# Patient Record
Sex: Male | Born: 1935
Health system: Southern US, Community
[De-identification: ages and names within clinical notes are randomized; demographics above are authoritative.]

## PROBLEM LIST (undated history)

## (undated) DIAGNOSIS — J45909 Unspecified asthma, uncomplicated: Secondary | ICD-10-CM

## (undated) DIAGNOSIS — I10 Essential (primary) hypertension: Secondary | ICD-10-CM

## (undated) DIAGNOSIS — G454 Transient global amnesia: Secondary | ICD-10-CM

## (undated) DIAGNOSIS — R0600 Dyspnea, unspecified: Secondary | ICD-10-CM

## (undated) DIAGNOSIS — I219 Acute myocardial infarction, unspecified: Secondary | ICD-10-CM

## (undated) DIAGNOSIS — I639 Cerebral infarction, unspecified: Secondary | ICD-10-CM

## (undated) DIAGNOSIS — R011 Cardiac murmur, unspecified: Secondary | ICD-10-CM

## (undated) DIAGNOSIS — I493 Ventricular premature depolarization: Secondary | ICD-10-CM

## (undated) DIAGNOSIS — E785 Hyperlipidemia, unspecified: Secondary | ICD-10-CM

## (undated) DIAGNOSIS — C61 Malignant neoplasm of prostate: Secondary | ICD-10-CM

## (undated) DIAGNOSIS — K219 Gastro-esophageal reflux disease without esophagitis: Secondary | ICD-10-CM

## (undated) DIAGNOSIS — Z87442 Personal history of urinary calculi: Secondary | ICD-10-CM

## (undated) DIAGNOSIS — I2510 Atherosclerotic heart disease of native coronary artery without angina pectoris: Secondary | ICD-10-CM

## (undated) DIAGNOSIS — I739 Peripheral vascular disease, unspecified: Secondary | ICD-10-CM

## (undated) HISTORY — DX: Ventricular premature depolarization: I49.3

## (undated) HISTORY — PX: COLONOSCOPY: SHX174

## (undated) HISTORY — DX: Unspecified asthma, uncomplicated: J45.909

## (undated) HISTORY — PX: MASTOIDECTOMY: SHX711

## (undated) HISTORY — PX: EYE SURGERY: SHX253

## (undated) HISTORY — PX: CARDIAC CATHETERIZATION: SHX172

## (undated) HISTORY — PX: MITRAL VALVE REPAIR: SHX2039

## (undated) HISTORY — PX: PROSTATECTOMY: SHX69

---

## 1997-11-28 ENCOUNTER — Emergency Department (HOSPITAL_COMMUNITY): Admission: EM | Admit: 1997-11-28 | Discharge: 1997-11-28 | Payer: Self-pay | Admitting: Emergency Medicine

## 1997-12-13 ENCOUNTER — Emergency Department (HOSPITAL_COMMUNITY): Admission: EM | Admit: 1997-12-13 | Discharge: 1997-12-13 | Payer: Self-pay | Admitting: Emergency Medicine

## 1998-07-14 ENCOUNTER — Ambulatory Visit (HOSPITAL_COMMUNITY): Admission: RE | Admit: 1998-07-14 | Discharge: 1998-07-14 | Payer: Self-pay | Admitting: Gastroenterology

## 1998-09-30 ENCOUNTER — Other Ambulatory Visit: Admission: RE | Admit: 1998-09-30 | Discharge: 1998-09-30 | Payer: Self-pay | Admitting: Urology

## 2000-02-28 ENCOUNTER — Other Ambulatory Visit: Admission: RE | Admit: 2000-02-28 | Discharge: 2000-02-28 | Payer: Self-pay | Admitting: Urology

## 2000-02-28 ENCOUNTER — Encounter (INDEPENDENT_AMBULATORY_CARE_PROVIDER_SITE_OTHER): Payer: Self-pay | Admitting: Specialist

## 2000-04-12 ENCOUNTER — Encounter: Payer: Self-pay | Admitting: Urology

## 2000-04-16 ENCOUNTER — Inpatient Hospital Stay (HOSPITAL_COMMUNITY): Admission: RE | Admit: 2000-04-16 | Discharge: 2000-04-19 | Payer: Self-pay | Admitting: Urology

## 2000-04-16 ENCOUNTER — Encounter (INDEPENDENT_AMBULATORY_CARE_PROVIDER_SITE_OTHER): Payer: Self-pay | Admitting: *Deleted

## 2000-12-27 ENCOUNTER — Encounter: Payer: Self-pay | Admitting: Otolaryngology

## 2000-12-27 ENCOUNTER — Ambulatory Visit (HOSPITAL_COMMUNITY): Admission: RE | Admit: 2000-12-27 | Discharge: 2000-12-27 | Payer: Self-pay | Admitting: Otolaryngology

## 2002-10-14 ENCOUNTER — Encounter: Admission: RE | Admit: 2002-10-14 | Discharge: 2002-10-14 | Payer: Self-pay | Admitting: Otolaryngology

## 2002-10-14 ENCOUNTER — Encounter: Payer: Self-pay | Admitting: Otolaryngology

## 2006-05-25 ENCOUNTER — Inpatient Hospital Stay (HOSPITAL_COMMUNITY): Admission: EM | Admit: 2006-05-25 | Discharge: 2006-05-29 | Payer: Self-pay | Admitting: Emergency Medicine

## 2007-08-26 ENCOUNTER — Observation Stay (HOSPITAL_COMMUNITY): Admission: EM | Admit: 2007-08-26 | Discharge: 2007-08-28 | Payer: Self-pay | Admitting: Emergency Medicine

## 2007-08-27 ENCOUNTER — Encounter (INDEPENDENT_AMBULATORY_CARE_PROVIDER_SITE_OTHER): Payer: Self-pay | Admitting: Gastroenterology

## 2007-08-27 ENCOUNTER — Encounter (INDEPENDENT_AMBULATORY_CARE_PROVIDER_SITE_OTHER): Payer: Self-pay | Admitting: Emergency Medicine

## 2009-05-26 ENCOUNTER — Emergency Department (HOSPITAL_COMMUNITY): Admission: EM | Admit: 2009-05-26 | Discharge: 2009-05-26 | Payer: Self-pay | Admitting: Emergency Medicine

## 2009-07-23 ENCOUNTER — Observation Stay (HOSPITAL_COMMUNITY): Admission: RE | Admit: 2009-07-23 | Discharge: 2009-07-24 | Payer: Self-pay | Admitting: Cardiology

## 2010-08-24 LAB — BASIC METABOLIC PANEL
CO2: 26 mEq/L (ref 19–32)
Calcium: 8.4 mg/dL (ref 8.4–10.5)
Chloride: 105 mEq/L (ref 96–112)
GFR calc Af Amer: >60 mL/min (ref >60)
Sodium: 135 mEq/L (ref 135–145)

## 2010-08-24 LAB — CBC
Hemoglobin: 12.8 g/dL — ABNORMAL LOW (ref 13.0–17.0)
MCHC: 34.4 g/dL (ref 30.0–36.0)
RBC: 3.95 MIL/uL — ABNORMAL LOW (ref 4.22–5.81)
WBC: 8.1 10*3/uL (ref 4.0–10.5)

## 2010-10-18 NOTE — Discharge Summary (Signed)
NAME:  Adam Arellano, Adam Arellano NO.:  [id number]   MEDICAL RECORD NO.:  [id number]          PATIENT TYPE:  OBV   LOCATION:  3729                         FACILITY:  [facility]   PHYSICIAN:  Danise Edge, M.D.   DATE OF BIRTH:  01-26-1932   DATE OF ADMISSION:  08/26/2007  DATE OF DISCHARGE:  08/28/2007                               DISCHARGE SUMMARY   DISCHARGE DIAGNOSIS:  Transient global amnesia.   HOSPITAL COURSE:  Mr. Adam Arellano is a 77 year old male born on  03/20/1932.  Mr. Adam Arellano developed an acute coronary syndrome  associated with slight elevation in his troponin levels in December  2007.  He underwent cardiac catheterization with placement of a drug-  eluting stent in the right coronary artery.   Mr. Adam Arellano was admitted to the hospital on August 26, 2007, with acute  confusion.  His wife reported that he was normal the day prior to  admission.  After taking a shower in preparation to going out to eat  with friends, he exited the shower quite confused consistent with  transient global amnesia.  The last thing Mr. Adam Arellano actually  remembers was having his tires put on his car at noon, the day of  admission.   On admission to the hospital, Mr. Adam Arellano electrocardiogram and chest  x-ray were reported to be normal.  His complete metabolic profile was  normal except for a low serum potassium 2.8 secondary to  hydrochlorothiazide.  His troponin level and CK-MB were normal.  His CBC  was normal.  His prothrombin time and partial thromboplastin time were  normal.  With oral potassium and stopping his hydrochlorothiazide, his  potassium rose to 3.3.  His fasting lipid profile showed a cholesterol  118, triglyceride 53, LDL cholesterol 73, and HDL cholesterol 34 on  simvastatin 40 mg daily.  Carotid Dopplers revealed no significant  internal carotid artery obstruction.  His noncontrasted CT scan of the  brain was reported to be normal by the admitting  physician.  There is no  report of the chest x-ray or the brain CT in the computer despite the  patient being in the hospital over 2 days.  Still pending results  include his 2-D cardiac echo and MRI - MRA of the brain despite having  been done over 24 hours ago.   Adam Arellano global amnesia has completely cleared.  He is medically  stable for discharge.   Despite having a number of test results unavailable, I do not want to  hold Mr. Adam Arellano in the hospital another whole day just waiting for  test results to come back.  He is being discharged in stable medical  condition and I will follow him up as an outpatient.   PAST MEDICAL HISTORY:  1. Prostate cancer.  2. Radical retropubic prostatectomy.  3. Coronary artery disease.  4. Hypertension.  5. Asthma.  6. Gastroesophageal reflux disease, December 2007.  7. Cardiac cath with placement of a right coronary artery drug-eluting      stent.  8. Dyslipidemia.   DISCHARGE MEDICATIONS:  1. Aspirin 81 mg daily.  2. Plavix  75 mg daily.  3. Omeprazole 20 mg each morning.  4. Simvastatin 40 mg daily.  5. Metoprolol 25 mg twice daily.   MEDICATION ALLERGIES:  None.   OFFICE FOLLOWUP:  I will meet with Mr. Adam Arellano in the office to go over  his outstanding lab tests when I can get the report back to him  hopefully in the next few days.           ______________________________  Danise Edge, M.D.     MJ/MEDQ  D:  08/28/2007  T:  08/29/2007  Job:  [id number]

## 2010-10-18 NOTE — H&P (Signed)
NAME:  Adam Arellano, Adam Arellano NO.:  [id number]   MEDICAL RECORD NO.:  [id number]          PATIENT TYPE:  EMS   LOCATION:  MAJO                         FACILITY:  [facility]   PHYSICIAN:  Michiel Cowboy, MDDATE OF BIRTH:  [birth year]   DATE OF ADMISSION:  08/26/2007  DATE OF DISCHARGE:                              HISTORY & PHYSICAL   PRIMARY CARE PHYSICIAN:  Danise Edge, M.D.   HISTORY OF PRESENT ILLNESS:  This is a 77 year old gentleman with a  history of hypertension, coronary artery disease, who was well until  this morning when he was noted by his family when he got out of the  shower he was confused.  The patient's family is currently not at  bedside.  The patient actually states that he does not remember any of  these events.  The last time he remembers he was supposed to pick up  some family from a different city and the next thing he knows he is in  the emergency department.  Per family, they also were not clear at which  point did the patient become confused, at least as per what has been  documented in the emergency department.  Again, currently the patient's  family is not around.   PAST MEDICAL HISTORY:  1. History of coronary artery disease status post stent placement in      2007.  2. History of hyperlipidemia and hypertension.  3. Asthma and GERD.   SOCIAL HISTORY  no tobacco, alcohol or drugs   FAMILY HISTORY  unremarkable   ALLERGIES:  No known drug allergies.   MEDICATIONS:  1. Albuterol as needed.  2. Simvastatin.  3. Hydrochlorothiazide.  4. Plavix.  5. Ranitidine.   Of note, in the past the patient was on Lopressor and Protonix but  currently not taking these medications.   PHYSICAL EXAMINATION:  VITAL SIGNS:  Blood pressure 145/77, heart rate  71, respirations 23, saturating 96% on room air.  GENERAL:  The patient is in no acute distress sitting on a stretcher.  Currently alert and oriented x3.  HEENT:  Head atraumatic.  Moist  mucous membranes.  LUNGS:  Clear to auscultation bilaterally.  EXTREMITIES:  Lower extremities without edema.  HEART:  Regular rate and rhythm without murmurs, rubs, or gallops.  ABDOMEN:  Soft and nontender.  NEUROLOGY:  Intact.   LABORATORY DATA:  White blood cell count 11.7, hemoglobin 14, platelets  307.  Sodium 139, potassium 3.3, creatinine 1.4.   STUDIES:  Chest x-ray shows no acute abnormality.   CT of the head without contrast showing no acute abnormalities.   EKG showing heart rate of 71, no change from EKG on May 28, 2006.   ASSESSMENT:  1. This is a 77 year old gentleman with history of confusion, acute in      onset, differential includes transient ischemic attack versus      stroke.  Metabolic otherwise the patient is within normal limits.      His potassium is slightly low, but we will replace this.  We will      admit the patient to telemetry, obtain a  stroke workup including      carotids, echocardiogram, and MRI/MRA of the brain.  Have physical      therapy evaluate the patient.  Continue Plavix.  May need to      discuss further with neurology in the future.  I will obtain      fasting lipid panel and see if the patient needs to have further      adjustment of his Zocor.  Of note, given that it is unclear when      the patient became confused, the patient was not a candidate for      thrombolytics.  2. Hypertension.  Given possibility of stroke, we will hold off on      antihypertensives right now.  May need to be restarted tomorrow if      the patient's blood pressure becomes significantly elevated.  3. Prophylaxis; SEDs and Protonix 40 mg p.o. daily.      Michiel Cowboy, MD  Electronically Signed     AVD/MEDQ  D:  08/26/2007  T:  08/26/2007  Job:  [id number]   cc:   Danise Edge, M.D.

## 2010-10-21 NOTE — H&P (Signed)
[place]. [facility]  Patient:    Adam Arellano, Adam Arellano                      MRN: [id number] Adm. Date:  [id number] Attending:  Lauree Chandler CC:         Verlin Grills, M.D.   History and Physical  HISTORY OF PRESENT ILLNESS:  This 77 year old gentleman was found to have prostatic carcinoma on needle biopsy of the prostate.  His PSAs had risen in the range of 5.6-6.63.  A needle biopsy on February 27, 2000, revealed a Gleason grade 6 adenocarcinoma in the left lobe and some basaloid hyperplasia in the right lobe along with atrophic changes.  He and his family were counseled about therapeutic options and he elected for radical retropubic prostatectomy.  They were advised about the medical complications and risks of incompetence, impotence, rectal injury, or hemorrhage.  He is in general good health.  He has some dyspepsia at times, some mild hypertension, and a history of asthma.  FAMILY HISTORY:  His family history is unremarkable and his mother lived to be 61.  His fathers history is unknown.  MEDICATIONS: 1. Cardura for voiding dysfunction, which we will discontinue. 2. Hydrochlorothiazide 12.5 mg daily. 3. Inhalers, including albuterol on a p.r.n. basis.  ALLERGIES:  None.  SOCIAL HISTORY:  Tobacco:  None.  Alcohol:  None.  REVIEW OF SYSTEMS:  As noted on our family history health form.  PHYSICAL EXAMINATION:  A pleasant white male with a ruddy complexion.  He is alert and oriented.  The skin is warm and dry.  In no acute distress.  NECK:  Supple.  CHEST:  Clear.  HEART:  Heart tones regular.  ABDOMEN:  Soft and nontender.  GENITALIA:  External genitalia unremarkable.  RECTAL:  The prostate feels benign and enlarged.  EXTREMITIES:  No edema.  IMPRESSION: 1. Prostatic carcinoma. 2. Hypertension. 3. Asthma. 4. Gastroesophageal reflux disease. DD:  04/16/00 TD:  04/16/00 Job: [id number]

## 2010-10-21 NOTE — Cardiovascular Report (Signed)
NAME:  Adam Arellano, Adam Arellano NO.:  [id number]   MEDICAL RECORD NO.:  [id number]          PATIENT TYPE:  INP   LOCATION:  2807                         FACILITY:  [facility]   PHYSICIAN:  Madaline Savage, M.D.DATE OF BIRTH:  Mar 25, 1932   DATE OF PROCEDURE:  05/28/2006  DATE OF DISCHARGE:                            CARDIAC CATHETERIZATION   PROCEDURES PERFORMED:  1. Selective coronary angiography by Judkins technique.  2. Retrograde left heart catheterization.  3. Left ventricular angiography.  4. Intracoronary artery stenting of the distal right coronary artery      preceded by balloon angioplasty of the same lesion.  5. AngioSeal closure of the right femoral artery.   COMPLICATIONS:  None.   ENTRY SITE:  Right femoral.   DYE USED:  Omnipaque.   MEDICATIONS GIVEN:  Intracoronary artery nitroglycerin, intravenous  Integrilin, oral Plavix, and intravenous fentanyl. ACT during procedure  was 294.   PATIENT PROFILE:  The patient is a delightful 77 year old gentleman who  has enjoyed good health for most of his life who came into the emergency  room as a new the patient, having had chest and shoulder pain that had  been occurring for about 6 weeks, worse in the last few days, to both  shoulders and chest burning, occurring after meals most often. He  presented to the emergency room after chest pain became severe and would  not resolve.   He has a history of hypertension, prostate cancer status post  prostatectomy, and gastroesophageal reflux disease.  The patient's  cardiac enzymes showed his first troponin to be negative, but his second  troponin was positive as was his third and fourth troponin. Because of  this, he was brought to the catheterization lab for diagnostic cardiac  catheterization.  It should also be noted that the patient had a rest  stress test, and it showed decreased perfusion to the inferior wall   RESULTS:   PRESSURES:  Left ventricular pressure  was 135/0, end-diastolic pressure  13.  Central aortic pressure 135/70, mean of 95. No aortic valve  gradient by pullback technique.   ANGIOGRAPHIC RESULTS:  The patient's left main coronary artery was short  and normal.  It trifurcated into a large intermediate ramus branch, a  fairly large circumflex branch, and a smaller LAD.  The LAD contained  some luminal irregularities at the ostium but no significant lesions to  speak of throughout its course, and it gave rise to two septal  perforator branches and a diagonal branch.  No significant lesions in  LAD other than about a 30% ostial area of narrowing.  Intermediate ramus  branch was large and went all the way virtually to the cardiac apex.  No  lesions were seen throughout this vessel.   Left circumflex contained luminal irregularities throughout. It gave  rise to a first obtuse marginal branch which was small and a second very  large diagonal branch which gave rise to a second obtuse marginal branch  which was large and basically not diseased. There was noted to be  collateralization from the septal perforator branches to the distal RCA.  Right coronary artery was a large and dominant vessel in its  circulation.  It showed a 50% area of smooth irregularity in the  proximal portion just after the tip of the guide catheter.  It appeared  to be a stable lesion. There was a second stable lesion in the mid  portion of the RCA is it made a turn toward the apex, and then there was  the patient's culprit vessel which was a 99% eccentric stenosis of the  distal RCA just before the vessel trifurcated into a posterolateral  artery and two posterior descending branches.   It should be noted there was TIMI-3 flow in this diseased culprit RCA  vessel.   Left ventricular angiography showed good contractility of all wall  segments without any significant inferior wall hypokinesis.  There was a  lot of ventricular irritability, however, during  the injection.   Percutaneous intervention was accomplished with 6-French catheters.  A  [employer] marker wire and a predilatation balloon, which was a  2.0 x 12 mm Maverick balloon and post dilatation with of 3.25 x 20 mm  Quantum Maverick balloon. The TAXUS element stent used for this  procedure was a 3.0 x 28 mm stent.   After trying an extra-support guide catheter for the right coronary  artery, I abandoned that because it had a tendency to deep throat the  vessel and cause damping of pressure.  I instead used a Cordis JR-4, 6-  [place] guide catheter. The guidewire used was the marker wire which was  an excellent choice. The Maverick balloon crossed the lesion easily and,  after two balloon dilatations, showed improvement of the tight ostial  stenosis that was eccentric in the distal RCA.  I then was able to place  a TAXUS element stent across the stenotic vessel, keeping in mind that  there was an area of proximal tight stenosis of 50% narrowing, and I was  able to cover both areas with the TAXUS stent.  I deployed it to  approximately 14 atmospheres of pressure and then resorted to a Quantum  Maverick balloon to post dilate, and I used approximately 16-17 mm of  pressure for about 45-60 seconds.  No complications occurred.  The  patient tolerated the procedure very well.  Finally I performed an  elective closure of the right femoral artery with Angio-Seal after  making sure by right femoral artery angiogram that the indwelling sheath  was in an appropriate place. The AngioSeal resulted in excellent  hemostasis.   The patient received 300 mg of Plavix along with Integrilin, and the ACT  was 294. No complications occurred.  The patient had a satisfactory  result.   FINAL DIAGNOSES:  1. Recent unstable angina and non-ST segment elevation myocardial      infarction by troponin evaluation. 2. Essentially single-vessel coronary artery disease with culprit      vessel being  the distal right coronary artery, 99% eccentrically      stenosed.  3. Successful TAXUS element stenting of the distal right coronary      artery with 99% stenosis reduced to 0% residual.   PLAN:  The patient will potentially be eligible for discharge tomorrow  after he completes his Integrilin this evening and more Plavix this  evening. We will see him in followup in a week or two.           ______________________________  Madaline Savage, M.D.     WHG/MEDQ  D:  05/28/2006  T:  05/28/2006  Job:  [id number]   cc:   Cardiac Catheterization Lab

## 2010-10-21 NOTE — Discharge Summary (Signed)
[place]. [facility]  Patient:    Adam Arellano, Adam Arellano                      MRN: [id number] Adm. Date:  [id number] Disc. Date: [id number] Attending:  Lauree Chandler CC:         Verlin Grills, M.D.   Discharge Summary  FINAL DIAGNOSES: 1. Prostate carcinoma. 2. Hypertension. 2. Asthma. 4. Gastroesophageal reflux disease.  PROCEDURE:  Radical retropubic prostatectomy and bilateral pelvic lymph node dissection.  HISTORY OF PRESENT ILLNESS:  This 77 year old gentleman was found to have a Gleason grade 6 adenocarcinoma when his PSA rose to the range of 5.6 to 6.6. He was counseled about therapeutic options and he elected surgical care. He has a history of hypertension, gastroesophageal reflux disease and asthma that are well-controlled.  PHYSICAL EXAMINATION:  Physical examination was unremarkable.  HOSPITAL COURSE:  After admission her underwent a radical retropubic prostatectomy.  His postoperative course was benign except for fever spike the night of surgery, but after that he became afebrile.  He had very little wound drainage.  His urine remained clear and his catheter functioned well.  He rapidly resumed bowel function.  He was ready for discharge on April 19, 2000 after having removed his Harrison Mons drain.  He will go home with  Foley catheter and leg bag.  DISCHARGE MEDICATIONS:  He will use Vicodin for pain and continue on hydrochlorothiazide and albuterol inhaler.  FOLLOW-UP:  He will return to the office next week for skin staple removal.  DISPOSITION:  He was sent home in good condition.  DISCHARGE DIET/ACTIVITY:  He will resume a diet as tolerated and light activity.  Final pathology is pending. DD:  04/19/00 TD:  04/19/00 Job: [id number]

## 2010-10-21 NOTE — Op Note (Signed)
[place]. [facility]  Patient:    Adam Arellano, Adam Arellano                      MRN: [id number] Proc. Date: 04/16/00 Adm. Date:  [id number] Attending:  Lauree Chandler CC:         Verlin Grills, M.D.   Operative Report  PREOPERATIVE DIAGNOSIS:  Carcinoma of the prostate.  POSTOPERATIVE DIAGNOSIS:  Carcinoma of the prostate.  OPERATION PERFORMED:  Radical retrograde pyelogram and bilateral pelvic lymph node dissection.  SURGEON:  Maretta Bees. Vonita Moss, M.D.  ASSISTANT:  Excell Seltzer. Annabell Howells, M.D.  ANESTHESIA:  General.  INDICATIONS FOR PROCEDURE:  This 77 year old gentleman was found to have Gleason grade 6 carcinoma of the prostate.  His PSA has been in the 5 to 6 range. He was counseled about therapeutic measures and opted for radical retropubic prostatectomy.  DESCRIPTION OF PROCEDURE:  The patient was brought to the operating room and placed in lithotomy position and the lower abdomen and external genitalia were prepped and draped in the usual fashion.  A lower midline incision was made from the symphysis pubis up towards the umbilicus.  The muscle and fascia were separated in the midline and the retroperitoneal and pelvis spaces were dissected out on each side.  There were some adhesions on the right side and there was an opening made in the peritoneum which was subsequently repaired with 3-0 chromic catgut.  The obturator lymph node package on each side were dissected up.  There was very scanty tissue.  Hemostasis and lymphostasis was obtained with metal hemoclips.  The major vessels and obturator nerves were intact bilaterally at the end of those dissections.  Tissue was sent for permanent section.  The endopelvic fascia was taken down bilaterally. The puboprostatic ligaments were taken down.  The dorsal vein complex was isolated with a Hohenfeltner clamp and a #1 Vicryl tie placed around it.  The urethra was divided just beyond the apex of the  prostate and then then a clamp placed around the posterior urethra which was then brought up through the wound and divided.  The Foley catheter was divided and used for traction proximally. The Denonvilliers fascia was divided sharply at the apex of the prostate and then blunt dissection was used to mobilize the prostate off the rectum.  The neurovascular bundle was not very well defined and was pushed away manually. The posterior aspect of the seminal vesicles was palpated and the left seminal vesicle felt somewhat firm and worrisome.  The vascular pedicles of the prostate on each side were clipped with metal hemoclips.  The anterior bladder neck was opened and the ureteral orifices were well away from the bladder neck and excreting indigo carmine stained urine.  The posterior bladder neck was opened and a plane easily developed between the bladder base and the prostate. The seminal vesicles were then dissected down to their tips on each side and divided and clipped with metal hemoclips.  The specimen was removed intact. The bladder neck was then constructed by sewing bladder mucosa to the adjacent bladder neck muscle with interrupted 4-0 chromic catgut.  The posterior bladder neck was closed in tennis racket fashion using running 2-0 chromic catgut.  This created a fingerwidth sized bladder neck.  A 22 French 5 cc Foley catheter was then placed per urethra and with that in place, the anastomotic sutures were placed using the 2-0 Vicryl on UR-5 needle between the bladder neck and urethra  at 2, 5, 7, 10 and subsequently 12 oclock.  The Foley catheter was placed in the bladder and 15 cc placed in the balloon.  The tip had secured to it a #1 Prolene, which was brought out through the anterior bladder wall and then later through the abdominal wall and sewn to the skin level with a button.  The anastomosis came down nicely and this was tied in place.  The wound was irrigated with triple  antibiotic solution.  Through stab wound through the lesser incision, a large flat Blake drain was placed for prevesical wound drainage postoperatively and the drain sewed in place with heavy black silk.  The rectus fascia was closed with running #1 PDS.  The subcutaneous was irrigated and the skin closed with skin staples.  The wound was cleaned, dressed with dry sterile gauze dressings. Sponge, needle and instrument counts were correct.  The patient was transferred to the recovery room in good condition.  Blood loss was 500 to 600 cc.  He tolerated the procedure well. DD:  04/16/00 TD:  04/16/00 Job: [id number]

## 2010-10-21 NOTE — Discharge Summary (Signed)
NAME:  YUL, DIANA NO.:  [id number]   MEDICAL RECORD NO.:  [id number]          PATIENT TYPE:  INP   LOCATION:  2924                         FACILITY:  [facility]   PHYSICIAN:  Madaline Savage, M.D.DATE OF BIRTH:  1931/08/03   DATE OF ADMISSION:  05/25/2006  DATE OF DISCHARGE:                               DISCHARGE SUMMARY   DISCHARGE DIAGNOSES:  1. Acute coronary syndrome, status post catheterization and PCI with      insertion of TAXUS stent to the distal RCA.  2. Hypertension.  3. Dyslipidemia.   HISTORY OF PRESENT ILLNESS/HOSPITAL COURSE:  This is a 77 year old  Caucasian male presenting to the emergency department at [facility] by EMS with complaints of chest pain and shoulder pain.  He  reported that it has been going on for probably six weeks and now with  increased burning in the chest occurring after meals and exertional  dyspnea as well as exertional chest discomfort.  He was admitted to the  telemetry unit and we cycled his cardiac enzymes, which revealed mildly  elevated troponins, 0.06 and 0.1 with normal CK and CK-MB.   On December 24 the patient underwent a coronary angiography by Dr.  Elsie Lincoln, which revealed diffuse coronary disease with culprit lesion in  the distal RCA - 99% stenosis.  Dr. Elsie Lincoln performed angioplasty and  inserted drug-eluting TAXUS stent with great result.  In the followup  the same day, patient was seen in cardiac rehab and the recommendations  were to proceed with the phase II cardiac rehab as an outpatient.   The next morning the patient was assessed by Dr. Alanda Amass and failed to  be stable for discharge home.  He will be followed up with his primary  care physician and Dr. Elsie Lincoln in our office.   DISCHARGE MEDICATIONS:  1. Lopressor 25 mg b.i.d.  2. Plavix 75 mg daily.  3. Aspirin 325 mg daily.  4. Hydrochlorothiazide 25 mg daily.  5. Protonix 40 mg daily.   DISCHARGE DIET:  Low salt, low fat, low  cholesterol diet.   DISCHARGE ACTIVITIES:  No lifting greater than 5 pounds and no driving  for three days.   DISCHARGE FOLLOWUP:  Our office will contact the patient to schedule a  followup appointment with Dr. Elsie Lincoln within the next 2-3 weeks.      Raymon Mutton, P.A.    ______________________________  Madaline Savage, M.D.    MK/MEDQ  D:  05/29/2006  T:  05/30/2006  Job:  [id number]   cc:   [facility]

## 2011-02-27 LAB — POCT I-STAT, CHEM 8
Calcium, Ion: 1.13
Chloride: 101
Glucose, Bld: 107 — ABNORMAL HIGH
HCT: 42
Hemoglobin: 14.3
TCO2: 29

## 2011-02-27 LAB — URINALYSIS, ROUTINE W REFLEX MICROSCOPIC
Nitrite: NEGATIVE
Specific Gravity, Urine: 1.017
pH: 5.5

## 2011-02-27 LAB — COMPREHENSIVE METABOLIC PANEL
ALT: 13
AST: 18
AST: 21
Albumin: 3.3 — ABNORMAL LOW
CO2: 28
Calcium: 8.9
Chloride: 104
Creatinine, Ser: 1.08
GFR calc Af Amer: >60
GFR calc Af Amer: >60
GFR calc non Af Amer: >60
Sodium: 142
Total Bilirubin: 0.7
Total Protein: 6.2

## 2011-02-27 LAB — CBC
Hemoglobin: 14
MCHC: 34.2
Platelets: 259
Platelets: 307
RBC: 4.08 — ABNORMAL LOW
RBC: 4.42
RDW: 12.1
RDW: 12.4

## 2011-02-27 LAB — DIFFERENTIAL
Basophils Absolute: 0
Lymphocytes Relative: 11 — ABNORMAL LOW
Lymphs Abs: 1.3
Neutro Abs: 9.9 — ABNORMAL HIGH

## 2011-02-27 LAB — POCT CARDIAC MARKERS
CKMB, poc: 1.2
Myoglobin, poc: 117
Operator id: 277751

## 2011-02-27 LAB — LIPID PANEL
Cholesterol: 118
HDL: 34 — ABNORMAL LOW
LDL Cholesterol: 73
Total CHOL/HDL Ratio: 3.5

## 2011-02-27 LAB — PROTIME-INR: Prothrombin Time: 12.9

## 2011-08-15 ENCOUNTER — Other Ambulatory Visit: Payer: Self-pay | Admitting: Gastroenterology

## 2012-04-09 ENCOUNTER — Other Ambulatory Visit: Payer: Self-pay | Admitting: Gastroenterology

## 2013-03-22 ENCOUNTER — Other Ambulatory Visit: Payer: Self-pay | Admitting: Cardiology

## 2013-05-16 ENCOUNTER — Emergency Department (HOSPITAL_COMMUNITY): Payer: Medicare Other

## 2013-05-16 ENCOUNTER — Ambulatory Visit: Payer: Self-pay | Admitting: Cardiology

## 2013-05-16 ENCOUNTER — Encounter (HOSPITAL_COMMUNITY): Payer: Self-pay | Admitting: Emergency Medicine

## 2013-05-16 ENCOUNTER — Emergency Department (HOSPITAL_COMMUNITY)
Admission: EM | Admit: 2013-05-16 | Discharge: 2013-05-16 | Disposition: A | Discharge status: Home / Self Care | Payer: Medicare Other | Attending: Interventional Cardiology | Admitting: Interventional Cardiology

## 2013-05-16 DIAGNOSIS — I2510 Atherosclerotic heart disease of native coronary artery without angina pectoris: Secondary | ICD-10-CM | POA: Insufficient documentation

## 2013-05-16 DIAGNOSIS — Z7982 Long term (current) use of aspirin: Secondary | ICD-10-CM | POA: Insufficient documentation

## 2013-05-16 DIAGNOSIS — I10 Essential (primary) hypertension: Secondary | ICD-10-CM | POA: Insufficient documentation

## 2013-05-16 DIAGNOSIS — M25519 Pain in unspecified shoulder: Secondary | ICD-10-CM | POA: Insufficient documentation

## 2013-05-16 DIAGNOSIS — Z79899 Other long term (current) drug therapy: Secondary | ICD-10-CM | POA: Insufficient documentation

## 2013-05-16 DIAGNOSIS — Z7902 Long term (current) use of antithrombotics/antiplatelets: Secondary | ICD-10-CM | POA: Insufficient documentation

## 2013-05-16 DIAGNOSIS — R079 Chest pain, unspecified: Secondary | ICD-10-CM | POA: Insufficient documentation

## 2013-05-16 HISTORY — DX: Atherosclerotic heart disease of native coronary artery without angina pectoris: I25.10

## 2013-05-16 HISTORY — DX: Malignant neoplasm of prostate: C61

## 2013-05-16 HISTORY — DX: Transient global amnesia: G45.4

## 2013-05-16 HISTORY — DX: Hyperlipidemia, unspecified: E78.5

## 2013-05-16 HISTORY — DX: Essential (primary) hypertension: I10

## 2013-05-16 HISTORY — DX: Gastro-esophageal reflux disease without esophagitis: K21.9

## 2013-05-16 LAB — TROPONIN I
Troponin I: <0.3 ng/mL (ref <0.30)
Troponin I: <0.3 ng/mL (ref <0.30)

## 2013-05-16 LAB — CBC
MCH: 31.6 pg (ref 26.0–34.0)
Platelets: 218 10*3/uL (ref 150–400)
RBC: 4.18 MIL/uL — ABNORMAL LOW (ref 4.22–5.81)
RDW: 12.3 % (ref 11.5–15.5)
WBC: 7.3 10*3/uL (ref 4.0–10.5)

## 2013-05-16 LAB — BASIC METABOLIC PANEL
CO2: 27 mEq/L (ref 19–32)
Chloride: 103 mEq/L (ref 96–112)
GFR calc Af Amer: 59 mL/min — ABNORMAL LOW (ref >90)
Potassium: 3.5 mEq/L (ref 3.5–5.1)
Sodium: 140 mEq/L (ref 135–145)

## 2013-05-16 NOTE — Consult Note (Signed)
The patient has been seen, interviewed, and examined. All data has been reviewed. I agree with the assessment and plan as noted above. The second cardiac markers unremarkable, we will have the patient followup with Dr. stains in the next one to 3 weeks.

## 2013-05-16 NOTE — ED Notes (Signed)
Pt complains of bilateral shoulder pain- but no chest pain. Pt states pain feels like it did when he had to get stents in the past. Pt denies SOB, dizziness, N/V.

## 2013-05-16 NOTE — ED Notes (Signed)
Pt provided recliner chair and [place] sandwich.

## 2013-05-16 NOTE — ED Notes (Signed)
Spoke with Adam Arellano, notified pt troponin is negative. Reporting pt will be discharged.

## 2013-05-16 NOTE — Consult Note (Signed)
History and Physical  Patient ID: Adam Arellano MRN: [id number], DOB: July 24, 1931 Date of Encounter: 05/16/2013, 2:44 PM Primary Physician: Charolett Bumpers, MD Primary Cardiologist: Anne Fu  Chief Complaint: neck/shoulder pain Reason for Admission: neck/shoulder pain  HPI: Adam Arellano is a 79 y/o active M with history of HTN, CAD s/p prior stenting as below, remote prostate CA, dyslipidemia who presented to [facility] today with 2-day history of intermittent aching neck and shoulder pain. The discomfort is reminiscent of his prior angina, except this time it is not reproducible with exertion. He walks daily without any recent difficulty. He typically notices the pain when he wakes up in the morning, then recurs sporadically throughout the day. He's taken occasional Advil with relief. He has a hard time reporting how long it lasts because he hasn't necessarily paid attention to it. He feels increased neck discomfort if he leans his head back (reproduces the symptoms he's reporting). Symptoms improve if he turns his head side to side. He's had no episodes longer than an hour. Pain is not worse with inspiration, palpation or raising of arms. Denies chest pain, SOB, palpitations, nausea, vomiting, diaphoresis, change in GERD sx, injury, fall, trauma, dizziness, numbness or tingling. He reports compliance with meds. He's been taking Augmentin for a recent ear infection, otherwise no recent med changes. Today the pain was not particularly worse but given that it had been 2 days of intermittent sx, he decided to go get checked out. He took ASA at home earlier with relief. He drove himself to the ER from [place]. He walked from the parking lot down to to where the ER entrance used to be. When he found that it was closed he walked all the way back to the regular entrance and into the ER. With this, he experienced no chest discomfort, neck/shoulder discomfort or SOB.  CXR: no acute CP abnl. Troponin neg  x 1, otherwise labs nonacute, VSS. He is currently pain free.  Past Medical History  Diagnosis Date  . Hypertension   . Coronary artery disease     a. NSTEMI s/p DES to dRCA 2007. b. Angina 07/2009: s/p DESx2 to mid RCA for ISR and prox RCA, EF 65%.  . Transient global amnesia     a. Adm 2009: imaging nonacute, had resolved.  . Prostate cancer     a. s/p radical prostatectomy.  [name] GERD (gastroesophageal reflux disease)   . Dyslipidemia      Most Recent Cardiac Studies: Pt denies any recent cardiac study since 2011  2D Echo 08/2007 SUMMARY - Overall left ventricular systolic function was normal. Left ventricular ejection fraction was estimated to be 60 %. Left ventricular wall thickness was mildly increased.  - The aortic valve was mildly calcified. There was mild aortic valvular regurgitation. - There was mild mitral valvular regurgitation. - The left atrium was mildly dilated. - There was mild to moderate tricuspid valvular regurgitation.  Diag Cath 07/23/09: EF: normal systolic function. LVEF 65%. Normal WM. Dominance: right Discrete 90% mid lesion in RCA Tubular 70% mid LAD Diffuse 30% prox LAD Discrete 40% prox lesion in Cx Tubular 70% ostial lesion in RCA CAD - double vessel. Mid instent restenosis. New subtotal lesion mid RCA. --> PCI cath INDICATION:  The patient has progression of disease in the native right coronary proximal segment to 85% and proximal to the distal stent in the RCA there is 99% ISR lesion. PROCEDURE PERFORMED: 1. Drug-eluting stent mid to distal right coronary artery, restenotic  lesion. 2. Drug-eluting stent  proximal right coronary artery. DESCRIPTION:  After informed consent, we upgraded the sheath size to 6- [place].  2 mg of Versed and 50 mcg of fentanyl was then administered. PCI was then performed using Angiomax antithrombotic therapy with bolus and infusion at 0.75 mg/kg followed by 4.75 mg/kg per hour infusion.  A Judkins right  side-hole guide catheter 6-French was used.  An Office manager was used.  Predilatation at each site in the right coronary was performed with a 2.5 x 12-mm long apex balloon.  We then stented the proximal lesion with a 12-mm long x 3.5-mm diameter PROMUS stent to 15 atmospheres.  We followed this by performing stenting of the distal lesion with an 18 x 3.5-mm PROMUS stent.  Both stents were then postdilated with a 3.75 x 12-mm long Stoy Voyager each to 15 atmospheres. Less than 10% stenosis was noted in the proximal lesion, 0% stenosis in the distal lesion, and TIMI flow grade was 3.  Angio-Seal could not be used because of a bifurcation stick CONCLUSION:  Successful DES implantation in the proximal and mid RCA reducing 80% and 95% lesions to 0% with TIMI grade 3 flow. PLAN:  Aspirin and Plavix.  Follow up with Dr. Amil Amen.   Surgical History:  Past Surgical History  Procedure Laterality Date  . Prostatectomy       Home Meds: Prior to Admission medications   Medication Sig Start Date End Date Taking? Authorizing Provider  amoxicillin-clavulanate (AUGMENTIN) 500-125 MG per tablet Take 1 tablet by mouth 2 (two) times daily.   Yes Historical Provider, MD  aspirin 81 MG tablet Take 81 mg by mouth daily.   Yes Historical Provider, MD  clopidogrel (PLAVIX) 75 MG tablet TAKE 1 TABLET ONCE A DAY 03/22/13  Yes Donato Schultz, MD  loratadine (CLARITIN) 10 MG tablet Take 10 mg by mouth daily.   Yes Historical Provider, MD  metoprolol tartrate (LOPRESSOR) 25 MG tablet Take 12.5 mg by mouth 2 (two) times daily.   Yes Historical Provider, MD  Multiple Vitamin (MULTIVITAMIN WITH MINERALS) TABS tablet Take 1 tablet by mouth daily.   Yes Historical Provider, MD  simvastatin (ZOCOR) 20 MG tablet Take 20 mg by mouth daily at 6 PM.   Yes Historical Provider, MD    Allergies: No Known Allergies  History   Social History  . Marital Status: Married    Spouse Name: N/A    Number of Children: N/A  .  Years of Education: N/A   Occupational History  . Not on file.   Social History Main Topics  . Smoking status: Former Games developer  . Smokeless tobacco: Not on file     Comment: Smoked for 15 years  . Alcohol Use: Yes     Comment: rare  . Drug Use: No  . Sexual Activity: Not on file   Other Topics Concern  . Not on file   Social History Narrative  . No narrative on file     Family History  Problem Relation Age of Onset  . CAD Neg Hx     Review of Systems: General: negative for chills, fever Cardiovascular: negative for edema, orthopnea, palpitations, paroxysmal nocturnal dyspnea, shortness of breath or dyspnea on exertion Dermatological: negative for rash Respiratory: negative for wheezing Urologic: negative for hematuria Abdominal: negative for nausea, vomiting, diarrhea, bright red blood per rectum, melena, or hematemesis Neurologic: negative for visual changes, syncope, or dizziness All other systems reviewed and are otherwise negative except as  noted above.  Labs:   Lab Results  Component Value Date   WBC 7.3 05/16/2013   HGB 13.2 05/16/2013   HCT 39.6 05/16/2013   MCV 94.7 05/16/2013   PLT 218 05/16/2013    Recent Labs Lab 05/16/13 1100  NA 140  K 3.5  CL 103  CO2 27  BUN 22  CREATININE 1.30  CALCIUM 8.6  GLUCOSE 87    Recent Labs  05/16/13 1100  TROPONINI <0.30   Radiology/Studies:  Dg Chest 2 View 05/16/2013   CLINICAL DATA:  Neck and shoulder pain.  EXAM: CHEST  2 VIEW  COMPARISON:  August 26, 2007.  FINDINGS: Stable cardiomediastinal silhouette. No pleural effusion or pneumothorax is noted. Anterior osteophyte formation is seen involving lower thoracic spine. Small calcified granuloma is in left lung apex and stable. Stable mild right apical scarring is noted. No acute pulmonary disease is noted.  IMPRESSION: No acute cardiopulmonary abnormality seen.   Electronically Signed   By: Roque Lias M.D.   On: 05/16/2013 11:47    EKG: NSR 66bpm septal  infarct age undetermined, TW flattening avL, TWI V2 which are new from 2011 but otherwise no acute changes Tele: NSR/SB occ PVCs  Physical Exam: Blood pressure 143/78, pulse 56, temperature 97.7 F (36.5 C), temperature source Oral, resp. rate 20, height 5\' 8"  (1.727 m), weight 180 lb (81.647 kg), SpO2 100.00%. General: Well developed, well nourished WM in no acute distress. Head: Normocephalic, atraumatic, sclera non-icteric, no xanthomas, nares are without discharge.  Neck: Negative for carotid bruits. JVD not elevated. Lungs: Clear bilaterally to auscultation without wheezes, rales, or rhonchi. Breathing is unlabored. Heart: RRR with S1 S2. No murmurs, rubs, or gallops appreciated. Abdomen: Soft, non-tender, non-distended with normoactive bowel sounds. No hepatomegaly. No rebound/guarding. No obvious abdominal masses. Msk:  Strength and tone appear normal for age. No acute bony pathology by gross shoulder/neck examination. Full neck ROM but patient has discomfort in neck leaning neck back. Extremities: No clubbing or cyanosis. No edema.  Distal pedal pulses are 2+ and equal bilaterally. Neuro: Alert and oriented X 3. No focal deficit. No facial asymmetry. Moves all extremities spontaneously. Psych:  Responds to questions appropriately with a normal affect.    ASSESSMENT AND PLAN:  1. Neck/shoulder pain, suspect musculoskeletal 2. CAD (DES to [facility] 2007, DESx2 to [facility] for ISR/prox RCA 2011) 3. HTN 4. Dyslipidemia 5. Occasional PVCs  Suspect symptoms are primarily musculoskeletal in etiology. He walked all around the hospital today trying to get to the ER without any exertional symptoms. Moving his neck back makes the pain worse and moving his head around side to side makes the pain better. Initial troponin is negative. Would recheck another troponin and if negative, we feel he would be OK to go home from the ER and follow-up with Dr. Anne Fu in the office. I will leave a message with the  schedulers to call him with this appointment. He should f/u PCP for musculoskeletal issue  If next troponin is abnormal, he will need to be admitted for [facility] on Monday.  Signed, Ronie Spies PA-C 05/16/2013, 2:44 PM

## 2013-05-16 NOTE — ED Notes (Signed)
Pt c/o neck and upper shoulder pain x 2 days that is similar to when had MI in past; pt denies CP or SOB

## 2013-05-16 NOTE — ED Notes (Signed)
Cardiology at bedside.

## 2013-05-16 NOTE — ED Provider Notes (Signed)
CSN: [id number]     Arrival date & time 05/16/13  1018 History   First MD Initiated Contact with Patient 05/16/13 1100     Chief Complaint  Patient presents with  . Neck Pain  . Shoulder Pain    HPI Pt was seen at 1100. Per pt, c/o gradual onset and persistence of multiple intermittent episodes of upper chest and shoulders discomfort for the past 2 to 3 days. Describes the discomfort as "aching" and "like the last time I needed a stent in my heart." Pt states the discomfort has been occurring in the mornings after he wakes up. Discomfort improves with full dose ASA and resting. Denies any discomfort currently. Denies N/V/D, no abd pain, no palpitations, no SOB/cough, no neck pain, no fevers, no rash.    Cards: Dr. Anne Fu Past Medical History  Diagnosis Date  . Hypertension   . Coronary artery disease    History reviewed. No pertinent past surgical history.  History  Substance Use Topics  . Smoking status: Never Smoker   . Smokeless tobacco: Not on file  . Alcohol Use: No    Review of Systems ROS: Statement: All systems negative except as marked or noted in the HPI; Constitutional: Negative for fever and chills. ; ; Eyes: Negative for eye pain, redness and discharge. ; ; ENMT: Negative for ear pain, hoarseness, nasal congestion, sinus pressure and sore throat. ; ; Cardiovascular: +CP. Negative for palpitations, diaphoresis, dyspnea and peripheral edema. ; ; Respiratory: Negative for cough, wheezing and stridor. ; ; Gastrointestinal: Negative for nausea, vomiting, diarrhea, abdominal pain, blood in stool, hematemesis, jaundice and rectal bleeding. . ; ; Genitourinary: Negative for dysuria, flank pain and hematuria. ; ; Musculoskeletal: Negative for back pain and neck pain. Negative for swelling and trauma.; ; Skin: Negative for pruritus, rash, abrasions, blisters, bruising and skin lesion.; ; Neuro: Negative for headache, lightheadedness and neck stiffness. Negative for weakness, altered  level of consciousness , altered mental status, extremity weakness, paresthesias, involuntary movement, seizure and syncope.       Allergies  Review of patient's allergies indicates no known allergies.  Home Medications   Current Outpatient Rx  Name  Route  Sig  Dispense  Refill  . amoxicillin-clavulanate (AUGMENTIN) 500-125 MG per tablet   Oral   Take 1 tablet by mouth 2 (two) times daily.         [name] aspirin 81 MG tablet   Oral   Take 81 mg by mouth daily.         . clopidogrel (PLAVIX) 75 MG tablet      TAKE 1 TABLET ONCE A DAY   30 tablet   9   . loratadine (CLARITIN) 10 MG tablet   Oral   Take 10 mg by mouth daily.         . metoprolol tartrate (LOPRESSOR) 25 MG tablet   Oral   Take 12.5 mg by mouth 2 (two) times daily.         . Multiple Vitamin (MULTIVITAMIN WITH MINERALS) TABS tablet   Oral   Take 1 tablet by mouth daily.         . simvastatin (ZOCOR) 20 MG tablet   Oral   Take 20 mg by mouth daily at 6 PM.          BP 137/85  Pulse 66  Temp(Src) 97.7 F (36.5 C) (Oral)  Resp 18  Ht 5\' 8"  (1.727 m)  Wt 180 lb (81.647 kg)  BMI 27.38  kg/m2  SpO2 100% Physical Exam 1105: Physical examination:  Nursing notes reviewed; Vital signs and O2 SAT reviewed;  Constitutional: Well developed, Well nourished, Well hydrated, In no acute distress; Head:  Normocephalic, atraumatic; Eyes: EOMI, PERRL, No scleral icterus; ENMT: +purulent fluid behind left TM. Mouth and pharynx normal, Mucous membranes moist; Neck: Supple, Full range of motion, No lymphadenopathy; Cardiovascular: Regular rate and rhythm, No gallop; Respiratory: Breath sounds clear & equal bilaterally, No wheezes.  Speaking full sentences with ease, Normal respiratory effort/excursion; Chest: Nontender, Movement normal; Abdomen: Soft, Nontender, Nondistended, Normal bowel sounds; Genitourinary: No CVA tenderness; Extremities: Pulses normal, No tenderness, No edema, No calf edema or asymmetry.; Neuro:  AA&Ox3, Major CN grossly intact.  Speech clear. No gross focal motor or sensory deficits in extremities.; Skin: Color normal, Warm, Dry.   ED Course  Procedures   1105:  Pt informed regarding purulent fluid behind his left TM. He and his daughter state he was recently rx augmentin for "an ear infection in that ear" but he stopped taking it "because I felt better."     EKG Interpretation    Date/Time:  Friday May 16 2013 10:21:41 EST Ventricular Rate:  66 PR Interval:  192 QRS Duration: 100 QT Interval:  424 QTC Calculation: 444 R Axis:   73 Text Interpretation:  Normal sinus rhythm Septal infarct , age undetermined Nonspecific ST and T wave abnormality Abnormal ECG Confirmed by [facility]  MD, Nicholos Johns [phone]) on 05/16/2013 11:27:31 AM            MDM  MDM Reviewed: previous chart, nursing note and vitals Reviewed previous: labs and ECG Interpretation: labs, ECG and x-ray   Results for orders placed during the hospital encounter of 05/16/13  CBC      Result Value Range   WBC 7.3  4.0 - 10.5 K/uL   RBC 4.18 (*) 4.22 - 5.81 MIL/uL   Hemoglobin 13.2  13.0 - 17.0 g/dL   HCT 78.4  69.6 - 29.5 %   MCV 94.7  78.0 - 100.0 fL   MCH 31.6  26.0 - 34.0 pg   MCHC 33.3  30.0 - 36.0 g/dL   RDW 28.4  13.2 - 44.0 %   Platelets 218  150 - 400 K/uL  BASIC METABOLIC PANEL      Result Value Range   Sodium 140  135 - 145 mEq/L   Potassium 3.5  3.5 - 5.1 mEq/L   Chloride 103  96 - 112 mEq/L   CO2 27  19 - 32 mEq/L   Glucose, Bld 87  70 - 99 mg/dL   BUN 22  6 - 23 mg/dL   Creatinine, Ser 1.02  0.50 - 1.35 mg/dL   Calcium 8.6  8.4 - 72.5 mg/dL   GFR calc non Af Amer 51 (*) >90 mL/min   GFR calc Af Amer 59 (*) >90 mL/min  TROPONIN I      Result Value Range   Troponin I <0.30  <0.30 ng/mL   Dg Chest 2 View 05/16/2013   CLINICAL DATA:  Neck and shoulder pain.  EXAM: CHEST  2 VIEW  COMPARISON:  August 26, 2007.  FINDINGS: Stable cardiomediastinal silhouette. No pleural effusion or  pneumothorax is noted. Anterior osteophyte formation is seen involving lower thoracic spine. Small calcified granuloma is in left lung apex and stable. Stable mild right apical scarring is noted. No acute pulmonary disease is noted.  IMPRESSION: No acute cardiopulmonary abnormality seen.   Electronically Signed   By:  Roque Lias M.D.   On: 05/16/2013 11:47    1340:  Denies symptoms while in the ED. Dx and testing d/w pt and family.  Questions answered.  Verb understanding, agreeable to evaluation by Cards MD in the ED.  T/C to Cardiology, case discussed, including:  HPI, pertinent PM/SHx, VS/PE, dx testing, ED course and treatment:  Agreeable to come to ED for eval for possible admit.   1600:  Cardiology service has come to the ED for eval: request to repeat troponin and if negative pt can be discharged.  EDP Dr. Effie Shy aware of plan.         Laray Anger, DO 05/16/13 [phone]

## 2013-05-16 NOTE — Consult Note (Signed)
Please see the note performed by Lucile Crater, PA-C. I have reviewed her note, taken a history from the patient, examined the patient, and reviewed all pertinent data including chest x-ray and EKGs. The patient's symptoms are highly atypical for ischemia although his prior ischemic symptoms were back pain. He cannot recall if they were similar to the current complaints. Moving the head and neck exacerbates/ALT is the discomfort. There is no exertional worsening. EKGs and nonischemic. I agree with the note by Ms. done that this is more likely a nonischemic symptom and probably musculoskeletal. We will check a second cardiac troponin and if negative allow the patient to be discharged from the emergency room. We'll request that he follow up with Dr. stains within the next one to 3 weeks depending upon how he feels. If symptoms continue and he is concerned he should see the doctor as soon as possible.

## 2013-05-20 ENCOUNTER — Telehealth: Payer: Self-pay | Admitting: Cardiology

## 2013-05-20 DIAGNOSIS — R079 Chest pain, unspecified: Secondary | ICD-10-CM

## 2013-05-20 NOTE — Telephone Encounter (Signed)
Scheduled Stress Echo, Dx: Chest pains

## 2013-05-22 ENCOUNTER — Encounter: Payer: Self-pay | Admitting: Cardiology

## 2013-05-22 ENCOUNTER — Ambulatory Visit (HOSPITAL_COMMUNITY): Payer: Medicare Other | Attending: Cardiology | Admitting: Radiology

## 2013-05-22 VITALS — BP 156/89 | Ht 68.5 in | Wt 176.0 lb

## 2013-05-22 DIAGNOSIS — R079 Chest pain, unspecified: Secondary | ICD-10-CM

## 2013-05-22 MED ORDER — TECHNETIUM TC 99M SESTAMIBI GENERIC - CARDIOLITE
11.0000 | Freq: Once | INTRAVENOUS | Status: AC | PRN
  Administered 2013-05-22: 11 via INTRAVENOUS

## 2013-05-22 MED ORDER — TECHNETIUM TC 99M SESTAMIBI GENERIC - CARDIOLITE
33.0000 | Freq: Once | INTRAVENOUS | Status: AC | PRN
  Administered 2013-05-22: 33 via INTRAVENOUS

## 2013-05-22 NOTE — Progress Notes (Signed)
[facility] SITE 3 NUCLEAR MED [address] [place], [place] [address] [phone]    Cardiology Nuclear Med Study  Adam Arellano is a 79 y.o. male     MRN : [id number]     DOB: September 12, 1931  Procedure Date: 05/22/2013  Nuclear Med Background Indication for Stress Test:  Evaluation for Ischemia and Post Hospital 12/14 CP, R/O MI History:  Hx MI,Stent, Echo 2009-EF 60% Cardiac Risk Factors: Hypertension and Lipids  Symptoms:  Chest Pain   Nuclear Pre-Procedure Caffeine/Decaff Intake:  None NPO After: 4 pm   Lungs:  clear O2 Sat: 94% on room air. IV 0.9% NS with Angio Cath:  22g  IV Site: R Antecubital  IV Started by:  Bonnita Levan, RN  Chest Size (in):  42 Cup Size: n/a  Height: 5' 8.5" (1.74 m)  Weight:  176 lb (79.833 kg)  BMI:  Body mass index is 26.37 kg/(m^2). Tech Comments:  N/A    Nuclear Med Study 1 or 2 day study: 1 day  Stress Test Type:  Stress  Reading MD: Donato Schultz, MD  Order Authorizing Provider:  Donato Schultz, MD  Resting Radionuclide: Technetium 97m Sestamibi  Resting Radionuclide Dose: 11.0 mCi   Stress Radionuclide:  Technetium 72m Sestamibi  Stress Radionuclide Dose: 33.0 mCi           Stress Protocol Rest HR: 64 Stress HR: 137  Rest BP: 156/89 Stress BP: 204/83  Exercise Time (min): 3:19 METS: 4.6   Predicted Max HR: 141 bpm % Max HR: 97.16 bpm Rate Pressure Product: 46962   Dose of Adenosine (mg):  n/a Dose of Lexiscan: n/a mg  Dose of Atropine (mg): n/a Dose of Dobutamine: n/a mcg/kg/min (at max HR)  Stress Test Technologist: Bonnita Levan, RN  Nuclear Technologist:  Domenic Polite, CNMT     Rest Procedure:  Myocardial perfusion imaging was performed at rest 45 minutes following the intravenous administration of Technetium 64m Sestamibi. Rest ECG: NSR with non-specific ST-T wave changes  Stress Procedure:  The patient exercised on the treadmill utilizing the Bruce Protocol for 3:19 minutes. The patient stopped due to fatigue  and denied any chest pain.  Technetium 2m Sestamibi was injected at peak exercise and myocardial perfusion imaging was performed after a brief delay. Stress ECG: No significant change from baseline ECG and There are scattered PVCs.  QPS Raw Data Images:  Normal; no motion artifact; normal heart/lung ratio. Stress Images:  Normal homogeneous uptake in all areas of the myocardium. Rest Images:  Normal homogeneous uptake in all areas of the myocardium. Subtraction (SDS):  No evidence of ischemia. Transient Ischemic Dilatation (Normal <1.22):  1.00 Lung/Heart Ratio (Normal <0.45):  0.33  Quantitative Gated Spect Images QGS EDV:  91 ml QGS ESV:  36 ml  Impression Exercise Capacity:  Fair exercise capacity. BP Response:  Normal blood pressure response. Clinical Symptoms:  No significant symptoms noted. ECG Impression:  There are scattered PVCs. Comparison with Prior Nuclear Study: No images to compare  Overall Impression:  Low risk stress nuclear study with no evidence of ischemia. (Prior RCA stents). .  LV Ejection Fraction: 61%.  LV Wall Motion:  NL LV Function; NL Wall Motion  Reassuring study. Atypical chest pain. Continue to monitor.

## 2013-06-10 ENCOUNTER — Ambulatory Visit (INDEPENDENT_AMBULATORY_CARE_PROVIDER_SITE_OTHER): Payer: Medicare Other | Admitting: Cardiology

## 2013-06-10 ENCOUNTER — Encounter: Payer: Self-pay | Admitting: Cardiology

## 2013-06-10 VITALS — BP 154/72 | HR 70 | Ht 67.0 in | Wt 182.0 lb

## 2013-06-10 DIAGNOSIS — I2510 Atherosclerotic heart disease of native coronary artery without angina pectoris: Secondary | ICD-10-CM

## 2013-06-10 DIAGNOSIS — I208 Other forms of angina pectoris: Secondary | ICD-10-CM | POA: Insufficient documentation

## 2013-06-10 DIAGNOSIS — E785 Hyperlipidemia, unspecified: Secondary | ICD-10-CM | POA: Insufficient documentation

## 2013-06-10 DIAGNOSIS — I209 Angina pectoris, unspecified: Secondary | ICD-10-CM

## 2013-06-10 DIAGNOSIS — I252 Old myocardial infarction: Secondary | ICD-10-CM | POA: Insufficient documentation

## 2013-06-10 NOTE — Progress Notes (Signed)
[facility]. [address] [place], [place]  [address] Phone: [phone] Fax:  [phone]  Date:  06/10/2013   ID:  Adam Arellano, DOB 08-Sep-1931, MRN [id number]  PCP:  Garlan Fair, MD   History of Present Illness: Adam Arellano is a 80 y.o. male with coronary artery disease, old myocardial infarction in 2007 with stent placement as below with recent atypical chest pain, hospitalization 05/16/13 in the emergency department only. Consultation done by Dr. Tamala Julian. Note reviewed. Troponins were normal. A stress test was done. It was low risk with no evidence of ischemia, EF of 61%. Excellent.  Never really had true chest pain, more back discomfort which was similar to his prior myocardial infarction and worried him. Overall reassuring workup. He gets a yearly physical with Dr. Wynetta Emery including cholesterol evaluation. He is compliant with his medications. He tries to stay quite active.    Wt Readings from Last 3 Encounters:  06/10/13 182 lb (82.555 kg)  05/22/13 176 lb (79.833 kg)  05/16/13 180 lb (81.647 kg)     Past Medical History  Diagnosis Date  . Hypertension   . Coronary artery disease     a. NSTEMI s/p DES to dRCA 2007. b. Angina 07/2009: s/p DESx2 to mid RCA for ISR and prox RCA, EF 65%.  . Transient global amnesia     a. Adm 2009: imaging nonacute, had resolved.  . Prostate cancer     a. s/p radical prostatectomy.  [name] GERD (gastroesophageal reflux disease)   . Dyslipidemia     Past Surgical History  Procedure Laterality Date  . Prostatectomy      Current Outpatient Prescriptions  Medication Sig Dispense Refill  . aspirin 81 MG tablet Take 81 mg by mouth daily.      [name] loratadine (CLARITIN) 10 MG tablet Take 10 mg by mouth daily.      . metoprolol tartrate (LOPRESSOR) 25 MG tablet Take 12.5 mg by mouth 2 (two) times daily.      . Multiple Vitamin (MULTIVITAMIN WITH MINERALS) TABS tablet Take 1 tablet by mouth daily.      . simvastatin (ZOCOR) 20  MG tablet Take 20 mg by mouth daily at 6 PM.       No current facility-administered medications for this visit.    Allergies:   No Known Allergies  Social History:  The patient  reports that he has quit smoking. He does not have any smokeless tobacco history on file. He reports that he drinks alcohol. He reports that he does not use illicit drugs.   ROS:  Please see the history of present illness.   No syncope, no bleeding, no orthopnea, no PND    PHYSICAL EXAM: VS:  BP 154/72  Pulse 70  Ht 5\' 7"  (1.702 m)  Wt 182 lb (82.555 kg)  BMI 28.50 kg/m2 Well nourished, well developed, in no acute distress HEENT: normal Neck: no JVD Cardiac:  normal S1, S2; RRR; no murmur Lungs:  clear to auscultation bilaterally, no wheezing, rhonchi or rales Abd: soft, nontender, no hepatomegaly Ext: no edema Skin: warm and dry Neuro: no focal abnormalities noted  EKG:  Prior EKGs reviewed, no ST segment changes Nuclear stress test 05/27/13-EF 61%, no ischemia especially in the region of the RCA stent. Reassuring.  Scattered PVCs noted.   ASSESSMENT AND PLAN:  1. Coronary artery disease-prior RCA stent. Nuclear stress test reassuring in 2014. Excellent. Continue with exercise. Secondary prevention. Atypical back pain. 2.  Old myocardial infarction-2007. EF normal. No ischemia. 3. Hyperlipidemia-continue with statin therapy 4. Hypertension-mildly elevated today. At home he is usually running in the 130 to 140 range. 5. We will see him back in one year or earlier if necessary.  Signed,  Candee Furbish, MD [facility]  06/10/2013 8:39 AM

## 2013-06-10 NOTE — Patient Instructions (Signed)
Your physician recommends that you continue on your current medications as directed. Please refer to the Current Medication list given to you today.  Your physician wants you to follow-up in: 1 year with Dr. Skains. You will receive a reminder letter in the mail two months in advance. If you don't receive a letter, please call our office to schedule the follow-up appointment.  

## 2013-10-24 ENCOUNTER — Telehealth: Payer: Self-pay | Admitting: Cardiology

## 2013-10-24 MED ORDER — LISINOPRIL 5 MG PO TABS: 5.0000 mg | ORAL_TABLET | Freq: Every day | ORAL | Status: DC

## 2013-10-24 NOTE — Telephone Encounter (Signed)
Daughter called stating her Dad calls her every morning with his BP checks.  They are normally good readings.  This AM BP was 187/85 about 1 hr ago.Took again a few minutes ago and was 200/99. Spoke w/pt.  He states he hasn't taken BP in about a week. States he has had some (L) shoulder pain off and on over the past week but not today. BP usually ranges around 135/75. States "he felt like something was wrong this AM" so took BP.  He denies symptoms of CP,SOB, headache.  States he did take 3 Bayer ASA this morning because he "didn't feel right" .  Also states he took 2 Advils last PM for no particular reason. Has taken his Metoprolol this AM.    Advised Dr. Marlou Porch not in office today and will forward message to him for evaluation.

## 2013-10-24 NOTE — Telephone Encounter (Signed)
New message    Blood pressure 187/85  @  Hour ago   Now medication taken  200/99 .   No chest pain , no sob.

## 2013-10-24 NOTE — Telephone Encounter (Signed)
Increase metoprolol to 25mg  PO twice a day. Take an extra 25mg  metoprolol now.  Also start lisinopril 5mg  PO once a day.   Have him check BP over the weekend and call on Tues with results.   Candee Furbish, MD

## 2013-10-24 NOTE — Telephone Encounter (Signed)
Advised pt and daughter to take 25 mg Metoprolol BID and to take extra dose now of 25 mg. Daughter wasn't sure of his dose of metoprolo.  When spoke w/pt he states he was taking 25 mg  tablet BID and Dr. Wynetta Emery cut to 1/2 tablet a day due to some lightheadedness but he states that wasn't the cause so he increased the Metoprolol on his own back to 25 mg BID. So now he is taking 25 mg Metoprolol BID.  Advised that Dr. Marlou Porch is also going to start medication Lisinopril 5 mg to take daily.  Advised both daughter and pt  to keep record of BP over the weekend and call Tues with readings. Will forward to Dr. Marlou Porch to advise that pt is already taking Metoprolol 25 mg BID. Will change in our records.

## 2013-10-30 NOTE — Telephone Encounter (Signed)
Called pt.  States he is feeling good now.  BP has been running from 125-135/75.  Is taking the Metoprolol 25 mg BID and Lisinopril 5 mg daily.  Will call if has any further problems.

## 2013-12-17 ENCOUNTER — Other Ambulatory Visit: Payer: Self-pay | Admitting: Gastroenterology

## 2013-12-17 DIAGNOSIS — K921 Melena: Secondary | ICD-10-CM

## 2013-12-24 ENCOUNTER — Other Ambulatory Visit: Payer: Medicare Other

## 2013-12-26 ENCOUNTER — Ambulatory Visit
Admission: RE | Admit: 2013-12-26 | Discharge: 2013-12-26 | Disposition: A | Discharge status: Home / Self Care | Payer: Medicare Other | Source: Ambulatory Visit | Attending: Gastroenterology | Admitting: Gastroenterology

## 2013-12-26 DIAGNOSIS — K921 Melena: Secondary | ICD-10-CM

## 2014-01-29 ENCOUNTER — Encounter: Payer: Self-pay | Admitting: *Deleted

## 2014-01-29 ENCOUNTER — Encounter: Payer: Self-pay | Admitting: Cardiology

## 2014-02-27 ENCOUNTER — Encounter: Payer: Self-pay | Admitting: Cardiology

## 2014-02-27 ENCOUNTER — Ambulatory Visit (INDEPENDENT_AMBULATORY_CARE_PROVIDER_SITE_OTHER): Payer: Medicare Other | Admitting: Cardiology

## 2014-02-27 VITALS — BP 118/68 | HR 61 | Ht 67.0 in | Wt 178.0 lb

## 2014-02-27 DIAGNOSIS — I10 Essential (primary) hypertension: Secondary | ICD-10-CM | POA: Insufficient documentation

## 2014-02-27 DIAGNOSIS — I2510 Atherosclerotic heart disease of native coronary artery without angina pectoris: Secondary | ICD-10-CM | POA: Insufficient documentation

## 2014-02-27 DIAGNOSIS — I252 Old myocardial infarction: Secondary | ICD-10-CM | POA: Insufficient documentation

## 2014-02-27 DIAGNOSIS — E7800 Pure hypercholesterolemia, unspecified: Secondary | ICD-10-CM | POA: Insufficient documentation

## 2014-02-27 MED ORDER — LOSARTAN POTASSIUM 25 MG PO TABS: 25.0000 mg | ORAL_TABLET | Freq: Every day | ORAL | Status: DC

## 2014-02-27 NOTE — Patient Instructions (Signed)
Please stop Lisinopril and start Losartan 25 mg once a day. Continue all other medications as listed.  Follow up in 1 year with Dr. Marlou Porch.  You will receive a letter in the mail 2 months before you are due.  Please call us when you receive this letter to schedule your follow up appointment.

## 2014-02-27 NOTE — Progress Notes (Signed)
[facility]. [address] [place], [place]  [address] Phone: [phone] Fax:  [phone]  Date:  02/27/2014   ID:  Adam Arellano, DOB 04/30/1932, MRN [id number]  PCP:  Garlan Fair, MD   History of Present Illness: Adam Arellano is a 80 y.o. male with coronary artery disease, old myocardial infarction in 2007 with stent placement as below with recent atypical chest pain, hospitalization 05/16/13 in the emergency department only. Consultation done by Dr. Tamala Julian. Note reviewed. Troponins were normal. A stress test was done. It was low risk with no evidence of ischemia, EF of 61%. Excellent.  Never really had true chest pain, more back discomfort which was similar to his prior myocardial infarction and worried him. Overall reassuring workup. He gets a yearly physical with Dr. Wynetta Emery including cholesterol evaluation. He is compliant with his medications. He tries to stay quite active.  Lost his wife. Had cough on lisinopril. Still working. Overall doing well.    Wt Readings from Last 3 Encounters:  02/27/14 178 lb (80.74 kg)  06/10/13 182 lb (82.555 kg)  05/22/13 176 lb (79.833 kg)     Past Medical History  Diagnosis Date  . Hypertension   . Coronary artery disease     a. NSTEMI s/p DES to dRCA 2007. b. Angina 07/2009: s/p DESx2 to mid RCA for ISR and prox RCA, EF 65%.  . Transient global amnesia     a. Adm 2009: imaging nonacute, had resolved.  . Prostate cancer     a. s/p radical prostatectomy.  [name] GERD (gastroesophageal reflux disease)   . Dyslipidemia   . Asthma     Past Surgical History  Procedure Laterality Date  . Prostatectomy      Current Outpatient Prescriptions  Medication Sig Dispense Refill  . aspirin 81 MG tablet Take 81 mg by mouth daily.      . clopidogrel (PLAVIX) 75 MG tablet Take 75 mg by mouth daily.      . hydrochlorothiazide (HYDRODIURIL) 25 MG tablet Take 25 mg by mouth daily.      . metoprolol tartrate (LOPRESSOR) 25 MG tablet  Take 1 tablet (25 mg total) by mouth 2 (two) times daily.      . Multiple Vitamin (MULTIVITAMIN WITH MINERALS) TABS tablet Take 1 tablet by mouth daily.      . simvastatin (ZOCOR) 20 MG tablet Take 20 mg by mouth daily at 6 PM.       No current facility-administered medications for this visit.    Allergies:   No Known Allergies  Social History:  The patient  reports that he has quit smoking. He does not have any smokeless tobacco history on file. He reports that he drinks alcohol. He reports that he does not use illicit drugs.   ROS:  Please see the history of present illness.   No syncope, no bleeding, no orthopnea, no PND    PHYSICAL EXAM: VS:  BP 118/68  Pulse 61  Ht 5\' 7"  (1.702 m)  Wt 178 lb (80.74 kg)  BMI 27.87 kg/m2 Well nourished, well developed, in no acute distress HEENT: normal Neck: no JVD Cardiac:  normal S1, S2; RRR; no murmur Lungs:  clear to auscultation bilaterally, no wheezing, rhonchi or rales Abd: soft, nontender, no hepatomegaly Ext: no edema Skin: warm and dry Neuro: no focal abnormalities noted  EKG:  Prior EKGs reviewed, no ST segment changes Nuclear stress test 05/27/13-EF 61%, no ischemia especially in the region  of the RCA stent. Reassuring.  Scattered PVCs noted.   ASSESSMENT AND PLAN:  1. Coronary artery disease-prior RCA stent. Nuclear stress test reassuring in 2014. Excellent. Continue with exercise. Secondary prevention. Atypical back pain. Continue with dual antiplatelet therapy because early generation drug eluting stent. 2. Old myocardial infarction-2007. EF normal. No ischemia. 3. Hyperlipidemia-continue with statin therapy 4. Hypertension-Good At home he is usually running in the 130 to 140 range. 5. We will see him back in one year or earlier if necessary.  Signed,  Candee Furbish, MD [facility]  02/27/2014 2:15 PM

## 2014-03-05 ENCOUNTER — Other Ambulatory Visit: Payer: Self-pay | Admitting: Cardiology

## 2014-08-10 ENCOUNTER — Encounter: Payer: Self-pay | Admitting: Physician Assistant

## 2014-08-10 ENCOUNTER — Telehealth: Payer: Self-pay

## 2014-08-10 ENCOUNTER — Ambulatory Visit (INDEPENDENT_AMBULATORY_CARE_PROVIDER_SITE_OTHER): Payer: Medicare Other | Admitting: Physician Assistant

## 2014-08-10 VITALS — BP 136/76 | HR 68 | Ht 67.0 in | Wt 188.8 lb

## 2014-08-10 DIAGNOSIS — Z9861 Coronary angioplasty status: Secondary | ICD-10-CM

## 2014-08-10 DIAGNOSIS — M542 Cervicalgia: Secondary | ICD-10-CM

## 2014-08-10 DIAGNOSIS — I10 Essential (primary) hypertension: Secondary | ICD-10-CM

## 2014-08-10 DIAGNOSIS — I2510 Atherosclerotic heart disease of native coronary artery without angina pectoris: Secondary | ICD-10-CM

## 2014-08-10 DIAGNOSIS — E785 Hyperlipidemia, unspecified: Secondary | ICD-10-CM

## 2014-08-10 LAB — TROPONIN I: TNIDX: 0 ug/l (ref 0.00–0.06)

## 2014-08-10 NOTE — Progress Notes (Signed)
Cardiology Office Note Date:  08/10/2014  Patient ID:  Adam Arellano, DOB 04/20/1932, MRN [id number] PCP:  Garlan Fair, MD  Cardiologist: Marlou Porch   Chief Complaint: follow-up of CAD, evaluate neck pain  History of Present Illness: Adam Arellano is a 81 y.o. male with history of CAD (NSTEMI s/p DES to dRCA 2007, s/p DESx2 to mid RCA for ISR and prox RCA in 2011, normal nuc 05/2013), prostate CA s/p prostatectomy, HTN, dyslipidemia, GERD, childhood asthma, PVCs who presents for evaluation of neck pain. He has been continued on dual antiplatelet therapy because early generation drug eluting stent. Per notes, his prior angina was somewhat atypical, moreso back discomfort. He was evaluated in the ER 05/2013 with intermittent aching neck and shoulder pain with negative troponins. Subsequent nuclear stress testing was normal. Reassurance was provided.  He returns today at the urging of his daughter who is a Marine scientist, due to neck discomfort. For the last several weeks he has woken up with posterior neck aching that lasts about 30 minutes. It comes and goes without any relationship to exertion. He is able to exert himself without any CP, dyspnea or neck/back pain. It is not particularly severe, just enough to notice it in the morning then he goes on with his day and "doesn't pay it much attention." He feels better when up moving around. Discomfort is worse when he nods forward and better when he leans his head back. Heating pad relieves the pain. 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 No SOB, diaphoresis, nausea, palpitations, bleeding, focal weakness, visual changes, numbness, tingling, or any other symptoms. Routine labs followed by PCP (not in Epic). BP has recently remained controlled. No recent trauma, falls, or prior MVAs. Discomfort was mildly present during EKG.  Past Medical History  Diagnosis Date  . Hypertension   . Coronary artery disease     a. NSTEMI s/p DES to dRCA 2007. b.  Angina 07/2009: s/p DESx2 to mid RCA for ISR and prox RCA, EF 65%. c. Stress test 05/2013: low risk, no evidence of ischemia, EF 61%.  . Transient global amnesia     a. Adm 2009: imaging nonacute, had resolved.  . Prostate cancer     a. s/p radical prostatectomy.  [name] GERD (gastroesophageal reflux disease)   . Dyslipidemia   . Asthma     a. Childhood.  [name] PVC's (premature ventricular contractions)     Past Surgical History  Procedure Laterality Date  . Prostatectomy      Current Outpatient Prescriptions  Medication Sig Dispense Refill  . aspirin 81 MG tablet Take 81 mg by mouth daily.    . clopidogrel (PLAVIX) 75 MG tablet TAKE 1 TABLET ONCE A DAY 30 tablet 10  . hydrochlorothiazide (HYDRODIURIL) 25 MG tablet Take 25 mg by mouth daily.    [name] losartan (COZAAR) 25 MG tablet Take 1 tablet (25 mg total) by mouth daily. 90 tablet 3  . metoprolol tartrate (LOPRESSOR) 25 MG tablet Take 1 tablet (25 mg total) by mouth 2 (two) times daily.    . Multiple Vitamin (MULTIVITAMIN WITH MINERALS) TABS tablet Take 1 tablet by mouth daily.    . simvastatin (ZOCOR) 20 MG tablet Take 20 mg by mouth daily at 6 PM.     No current facility-administered medications for this visit.    Allergies:   Lisinopril   Social History:  The patient  reports that he has quit smoking. He does not have any smokeless tobacco history on file.  He reports that he drinks alcohol. He reports that he does not use illicit drugs.   Family History:  The patient's family history is negative for CAD.  ROS:  Please see the history of present illness.  All other systems are reviewed and otherwise negative.   PHYSICAL EXAM: VS:  BP 136/76 mmHg  Pulse 68  Ht 5\' 7"  (1.702 m)  Wt 188 lb 12.8 oz (85.639 kg)  BMI 29.56 kg/m2 BMI: Body mass index is 29.56 kg/(m^2). Well nourished, well developed, in no acute distress HEENT: normocephalic, atraumatic Neck: no JVD Cardiac:  normal S1, S2; RRR; no murmur Lungs:  clear to auscultation  bilaterally, no wheezing, rhonchi or rales Abd: soft, nontender, no hepatomegaly Ext: no edema Skin: warm and dry Neuro:  moves all extremities spontaneously, no focal abnormalities noted MSK: notable for full ROM of the neck, increased subjective discomfort with nodding forward, improved with extension of the neck backwards, full arm ROM and strength, sensation in tact  EKG:  NSR 68bpm, no acute ST-T changes, unremarkable, unchanged from prior  Recent Labs: No results found for requested labs within last 365 days.  No results found for requested labs within last 365 days.   CrCl cannot be calculated (Patient has no serum creatinine result on file.).   Wt Readings from Last 3 Encounters:  08/10/14 188 lb 12.8 oz (85.639 kg)  02/27/14 178 lb (80.74 kg)  06/10/13 182 lb (82.555 kg)     Other studies reviewed: Additional studies/records reviewed today include: summarized above  ASSESSMENT AND PLAN:  1. Neck discomfort - very atypical for angina. Worse with neck flexion, improved with neck extension and heating pads. EKG unremarkable. Will check outpatient troponin given several weeks of symptoms and prior atypical presentation. If this is abnormal, he is aware we will contact him to advise him to go to the hospital. However, I would be really surprised if it was, as symptoms are strongly suggestive of musculoskeletal etiology. I discussed stress testing versus continued observation of symptoms and the patient would like to hold off on any further cardiac testing which I am in agreement of. Warning symptoms discussed. I advised patient f/u PCP for further evaluation of neck pain. 2. CAD s/p PCI as above - continue current regimen to include ASA, BB, statin, Plavix. He is on Plavix long-term due to earlier generation stent. 3. Essential HTN - generally controlled. Continue current regimen. 4. Hyperlipidemia - continue statin. He has a physical coming up this month and we can review lipids  from that time at his next OV.  Disposition: F/u with Dr. Marlou Porch in 6 weeks for re-evaluation.  Current medicines are reviewed at length with the patient today.  The patient did not have any concerns regarding medicines.   Raechel Ache PA-C 08/10/2014 2:37 PM     [place] [address] [phone] (office)  [phone] (fax)

## 2014-08-10 NOTE — Telephone Encounter (Signed)
-----   Message from Charlie Pitter, [place] sent at 08/10/2014  4:33 PM EST ----- Doristine Devoid news. Troponin was negative. Low likelihood for cardiac etiology. F/u as planned. Dayna Dunn PA-C

## 2014-08-10 NOTE — Telephone Encounter (Signed)
called to give pt lab results.tried both phone numbers listed for pt.lmtcb

## 2014-08-10 NOTE — Telephone Encounter (Signed)
Pt aware of lab results.  Great news. Troponin was negative. Low likelihood for cardiac etiology. F/u as planned  Pt verbalized understanding.

## 2014-08-10 NOTE — Telephone Encounter (Signed)
F/U       Pt returning call about lab results.     Please call back.

## 2014-08-10 NOTE — Telephone Encounter (Signed)
-----   Message from Adam Arellano, [place] sent at 08/10/2014  4:33 PM EST ----- Doristine Devoid news. Troponin was negative. Low likelihood for cardiac etiology. F/u as planned. Dayna Dunn PA-C

## 2014-08-10 NOTE — Patient Instructions (Addendum)
Your physician recommends that you continue on your current medications as directed. Please refer to the Current Medication list given to you today.  Lab Today: Troponin  Follow up with your pcp for your neck pain  Your physician recommends that you schedule a follow-up appointment in: 6 weeks with Dr.Skains

## 2014-09-22 ENCOUNTER — Ambulatory Visit (INDEPENDENT_AMBULATORY_CARE_PROVIDER_SITE_OTHER): Payer: Medicare Other | Admitting: Cardiology

## 2014-09-22 ENCOUNTER — Encounter: Payer: Self-pay | Admitting: Cardiology

## 2014-09-22 VITALS — BP 140/78 | HR 59 | Ht 68.5 in | Wt 184.1 lb

## 2014-09-22 DIAGNOSIS — I252 Old myocardial infarction: Secondary | ICD-10-CM

## 2014-09-22 DIAGNOSIS — I2510 Atherosclerotic heart disease of native coronary artery without angina pectoris: Secondary | ICD-10-CM

## 2014-09-22 DIAGNOSIS — E785 Hyperlipidemia, unspecified: Secondary | ICD-10-CM

## 2014-09-22 NOTE — Progress Notes (Signed)
Cardiology Office Note Date:  09/22/2014  Patient ID:  Adam Arellano, DOB September 05, 1931, MRN [id number] PCP:  Garlan Fair, MD  Cardiologist: Marlou Porch   Chief Complaint: follow-up of CAD, evaluate neck pain  History of Present Illness: Adam Arellano is a 81 y.o. male with history of CAD (NSTEMI s/p DES to dRCA 2007, s/p DESx2 to mid RCA for ISR and prox RCA in 2011, normal nuc 05/2013), prostate CA s/p prostatectomy, HTN, dyslipidemia, GERD, childhood asthma, PVCs who was seen approximately 6 weeks ago evaluation of neck pain. He has been continued on dual antiplatelet therapy because early generation drug eluting stent. Per notes, his prior angina was somewhat atypical, moreso back discomfort. He was evaluated in the ER 05/2013 with intermittent aching neck and shoulder pain with negative troponins. Subsequent nuclear stress testing was normal. Reassurance was provided.  He returns today for follow-up. He states that he has been feeling well. He believes that his pillow was the cause of his neck pain. Previously at the urging of his daughter who is a Marine scientist, was here due to neck discomfort. Last visit - For the last several weeks he has woken up with posterior neck aching that lasts about 30 minutes. It comes and goes without any relationship to exertion. He is able to exert himself without any CP, dyspnea or neck/back pain. It is not particularly severe, just enough to notice it in the morning then he goes on with his day and "doesn't pay it much attention." He feels better when up moving around. Discomfort is worse when he nods forward and better when he leans his head back. Heating pad relieves the pain. 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 No SOB, diaphoresis, nausea, palpitations, bleeding, focal weakness, visual changes, numbness, tingling, or any other symptoms. Routine labs followed by PCP (not in Epic). BP has recently remained controlled. No recent trauma, falls, or prior MVAs.  Discomfort was mildly present during EKG.  Now feels better. No issues.  BP at home is mostly been running in the 0130-140 range. Occasionally will be above 140.  Past Medical History  Diagnosis Date  . Hypertension   . Coronary artery disease     a. NSTEMI s/p DES to dRCA 2007. b. Angina 07/2009: s/p DESx2 to mid RCA for ISR and prox RCA, EF 65%. c. Stress test 05/2013: low risk, no evidence of ischemia, EF 61%.  . Transient global amnesia     a. Adm 2009: imaging nonacute, had resolved.  . Prostate cancer     a. s/p radical prostatectomy.  Adam Arellano GERD (gastroesophageal reflux disease)   . Dyslipidemia   . Asthma     a. Childhood.  Adam Arellano PVC's (premature ventricular contractions)     Past Surgical History  Procedure Laterality Date  . Prostatectomy      Current Outpatient Prescriptions  Medication Sig Dispense Refill  . aspirin 81 MG tablet Take 81 mg by mouth daily.    . clopidogrel (PLAVIX) 75 MG tablet TAKE 1 TABLET ONCE A DAY 30 tablet 10  . hydrochlorothiazide (HYDRODIURIL) 25 MG tablet Take 25 mg by mouth daily.    Adam Arellano losartan (COZAAR) 25 MG tablet Take 1 tablet (25 mg total) by mouth daily. 90 tablet 3  . metoprolol tartrate (LOPRESSOR) 25 MG tablet Take 1 tablet (25 mg total) by mouth 2 (two) times daily.    . Multiple Vitamin (MULTIVITAMIN WITH MINERALS) TABS tablet Take 1 tablet by mouth daily.    . simvastatin (  ZOCOR) 20 MG tablet Take 20 mg by mouth daily at 6 PM.     No current facility-administered medications for this visit.    Allergies:   Lisinopril   Social History:  The patient  reports that he has quit smoking. He does not have any smokeless tobacco history on file. He reports that he drinks alcohol. He reports that he does not use illicit drugs.   Family History:  The patient's family history is negative for CAD.  ROS:  Please see the history of present illness.  All other systems are reviewed and otherwise negative.   PHYSICAL EXAM: VS:  BP 140/78 mmHg   Pulse 59  Ht 5' 8.5" (1.74 m)  Wt 184 lb 1.9 oz (83.516 kg)  BMI 27.58 kg/m2 BMI: Body mass index is 27.58 kg/(m^2). Well nourished, well developed, in no acute distress HEENT: normocephalic, atraumatic Neck: no JVD Cardiac:  normal S1, S2; RRR; no murmur Lungs:  clear to auscultation bilaterally, no wheezing, rhonchi or rales Abd: soft, nontender, no hepatomegaly Ext: no edema Skin: warm and dry Neuro:  moves all extremities spontaneously, no focal abnormalities noted MSK: notable for full ROM of the neck, increased subjective discomfort with nodding forward, improved with extension of the neck backwards, full arm ROM and strength, sensation in tact  EKG:  NSR 68bpm, no acute ST-T changes, unremarkable, unchanged from prior  Recent Labs: No results found for requested labs within last 365 days.  No results found for requested labs within last 365 days.   CrCl cannot be calculated (Patient has no serum creatinine result on file.).   Wt Readings from Last 3 Encounters:  09/22/14 184 lb 1.9 oz (83.516 kg)  08/10/14 188 lb 12.8 oz (85.639 kg)  02/27/14 178 lb (80.74 kg)     Other studies reviewed: Additional studies/records reviewed today include: summarized above  ASSESSMENT AND PLAN:  1. Neck discomfort - very atypical for angina. Says he found out what was causing it. Pillow. Worse with neck flexion, improved with neck extension and heating pads. EKG unremarkable. reassuring  2. CAD s/p PCI as above - continue current regimen to include ASA, BB, statin, Plavix. He is on Plavix long-term due to earlier generation stent. 3. Essential HTN - generally controlled. Continue current regimen. we have room to increase his losartan. 4. Hyperlipidemia - continue statin. He has a physical coming up this month and we can review lipids from that time at his next OV.  Disposition: F/u with Dr. Marlou Porch in 6 month for follow up.  Current medicines are reviewed at length with the patient today.   The patient did not have any concerns regarding medicines.   Adam Rumpf, MD   09/22/2014 8:14 AM     [facility] [address] [place] [address] [phone] (office)  [phone] (fax)

## 2014-09-22 NOTE — Patient Instructions (Signed)
The current medical regimen is effective;  continue present plan and medications.  Follow up in 6 months with Dr. Skains.  You will receive a letter in the mail 2 months before you are due.  Please call us when you receive this letter to schedule your follow up appointment.  Thank you for choosing [facility]!!     

## 2014-11-20 ENCOUNTER — Encounter (HOSPITAL_COMMUNITY): Payer: Self-pay | Admitting: Emergency Medicine

## 2014-11-20 ENCOUNTER — Emergency Department (HOSPITAL_COMMUNITY)
Admission: EM | Admit: 2014-11-20 | Discharge: 2014-11-20 | Disposition: A | Discharge status: Home / Self Care | Payer: Medicare Other | Attending: Emergency Medicine | Admitting: Emergency Medicine

## 2014-11-20 DIAGNOSIS — Z8546 Personal history of malignant neoplasm of prostate: Secondary | ICD-10-CM | POA: Diagnosis not present

## 2014-11-20 DIAGNOSIS — Z87891 Personal history of nicotine dependence: Secondary | ICD-10-CM | POA: Insufficient documentation

## 2014-11-20 DIAGNOSIS — J45909 Unspecified asthma, uncomplicated: Secondary | ICD-10-CM | POA: Diagnosis not present

## 2014-11-20 DIAGNOSIS — Z7982 Long term (current) use of aspirin: Secondary | ICD-10-CM | POA: Insufficient documentation

## 2014-11-20 DIAGNOSIS — I2510 Atherosclerotic heart disease of native coronary artery without angina pectoris: Secondary | ICD-10-CM | POA: Diagnosis not present

## 2014-11-20 DIAGNOSIS — Z8719 Personal history of other diseases of the digestive system: Secondary | ICD-10-CM | POA: Insufficient documentation

## 2014-11-20 DIAGNOSIS — Z79899 Other long term (current) drug therapy: Secondary | ICD-10-CM | POA: Insufficient documentation

## 2014-11-20 DIAGNOSIS — R040 Epistaxis: Secondary | ICD-10-CM | POA: Diagnosis not present

## 2014-11-20 DIAGNOSIS — I10 Essential (primary) hypertension: Secondary | ICD-10-CM | POA: Insufficient documentation

## 2014-11-20 DIAGNOSIS — Z8669 Personal history of other diseases of the nervous system and sense organs: Secondary | ICD-10-CM | POA: Insufficient documentation

## 2014-11-20 DIAGNOSIS — E785 Hyperlipidemia, unspecified: Secondary | ICD-10-CM | POA: Diagnosis not present

## 2014-11-20 MED ORDER — SULFAMETHOXAZOLE-TRIMETHOPRIM 800-160 MG PO TABS: 1.0000 | ORAL_TABLET | Freq: Two times a day (BID) | ORAL | Status: AC

## 2014-11-20 NOTE — ED Provider Notes (Signed)
CSN: [id number]     Arrival date & time 11/20/14  0418 History   First MD Initiated Contact with Patient 11/20/14 [phone]     Chief Complaint  Patient presents with  . Epistaxis     (Consider location/radiation/quality/duration/timing/severity/associated sxs/prior Treatment) Patient is a 81 y.o. male presenting with nosebleeds. The history is provided by the patient.  Epistaxis He has been having intermittent nosebleeds from the left nostril for the last week. He had a severe bleed earlier this evening when she was not able to stop and he called for EMS. They were able to stop the bleeding so he stayed home but then bleeding recurred. He does take the pedicle although he states he stopped taking it 5 days ago because of the bleeding problems. He denies any trauma. He has had some intermittent problems with nosebleeds in the past.  Past Medical History  Diagnosis Date  . Hypertension   . Coronary artery disease     a. NSTEMI s/p DES to dRCA 2007. b. Angina 07/2009: s/p DESx2 to mid RCA for ISR and prox RCA, EF 65%. c. Stress test 05/2013: low risk, no evidence of ischemia, EF 61%.  . Transient global amnesia     a. Adm 2009: imaging nonacute, had resolved.  . Prostate cancer     a. s/p radical prostatectomy.  [name] GERD (gastroesophageal reflux disease)   . Dyslipidemia   . Asthma     a. Childhood.  [name] PVC's (premature ventricular contractions)    Past Surgical History  Procedure Laterality Date  . Prostatectomy    . Mastoidectomy      L ear   Family History  Problem Relation Age of Onset  . CAD Neg Hx    History  Substance Use Topics  . Smoking status: Former Research scientist (life sciences)  . Smokeless tobacco: Not on file     Comment: Smoked for 15 years  . Alcohol Use: Yes     Comment: rare    Review of Systems  HENT: Positive for nosebleeds.   All other systems reviewed and are negative.     Allergies  Lisinopril  Home Medications   Prior to Admission medications   Medication Sig Start  Date End Date Taking? Authorizing Provider  aspirin 81 MG tablet Take 81 mg by mouth daily.   Yes Historical Provider, MD  clopidogrel (PLAVIX) 75 MG tablet TAKE 1 TABLET ONCE A DAY 03/06/14  Yes Jerline Pain, MD  hydrochlorothiazide (HYDRODIURIL) 25 MG tablet Take 25 mg by mouth daily.   Yes Historical Provider, MD  losartan (COZAAR) 25 MG tablet Take 1 tablet (25 mg total) by mouth daily. 02/27/14  Yes Jerline Pain, MD  metoprolol tartrate (LOPRESSOR) 25 MG tablet Take 1 tablet (25 mg total) by mouth 2 (two) times daily. 10/24/13  Yes Jerline Pain, MD  Multiple Vitamin (MULTIVITAMIN WITH MINERALS) TABS tablet Take 1 tablet by mouth daily.   Yes Historical Provider, MD  simvastatin (ZOCOR) 20 MG tablet Take 20 mg by mouth daily at 6 PM.   Yes Historical Provider, MD   BP 161/67 mmHg  Pulse 68  Temp(Src) 97.6 F (36.4 C) (Oral)  Resp 16  Ht 5\' 8"  (1.727 m)  Wt 180 lb (81.647 kg)  BMI 27.38 kg/m2  SpO2 98% Physical Exam  Nursing note and vitals reviewed.  82 year old male, resting comfortably and in no acute distress. Vital signs are significant for hypertension. Oxygen saturation is 98%, which is normal. Head is normocephalic  and atraumatic. PERRLA, EOMI. Oropharynx is clear. Recent blood is present in the left nostril without an obvious bleeding site. There is no blood seen in the right nostril oropharynx. Neck is nontender and supple without adenopathy or JVD. Back is nontender and there is no CVA tenderness. Lungs are clear without rales, wheezes, or rhonchi. Chest is nontender. Heart has regular rate and rhythm without murmur. Abdomen is soft, flat, nontender without masses or hepatosplenomegaly and peristalsis is normoactive. Extremities have no cyanosis or edema, full range of motion is present. Skin is warm and dry without rash. Neurologic: Mental status is normal, cranial nerves are intact, there are no motor or sensory deficits.  ED Course  EPISTAXIS MANAGEMENT Date/Time:  11/20/2014 5:27 AM Performed by: Delora Fuel Authorized by: Roxanne Mins, Briteny Fulghum Consent: Verbal consent obtained. Written consent not obtained. Risks and benefits: risks, benefits and alternatives were discussed Consent given by: patient Patient understanding: patient states understanding of the procedure being performed Patient consent: the patient's understanding of the procedure matches consent given Procedure consent: procedure consent matches procedure scheduled Relevant documents: relevant documents present and verified Site marked: the operative site was marked Required items: required blood products, implants, devices, and special equipment available Patient identity confirmed: verbally with patient and arm band Time out: Immediately prior to procedure a "time out" was called to verify the correct patient, procedure, equipment, support staff and site/side marked as required. Anesthesia method: None. Patient sedated: no Treatment site: left anterior Repair method: anterior pack (Rapid Rhino 5.5 cm) Post-procedure assessment: bleeding stopped Treatment complexity: simple Patient tolerance: Patient tolerated the procedure well with no immediate complications   (including critical care time)   MDM   Final diagnoses:  Acute anterior epistaxis    Anterior epistaxis on the left side. Rapid Rhino is inserted and he is observed in the ED.  There is been no further bleeding. He is discharged with prescription for trimethoprim-sulfamethoxazole and is referred to his ENT physician to be seen in 3 days. I have asked him to contact his cardiologist to make sure that it is safe for him to stay off of his Plavix until his nosebleed has completely resolved.  Delora Fuel, MD 05/28/48 [id number]

## 2014-11-20 NOTE — Discharge Instructions (Signed)
Please talk with your cardiologist to see if it is safe for you to stay off of your Plavix.   Nosebleed Nosebleeds can be caused by many conditions, including trauma, infections, polyps, foreign bodies, dry mucous membranes or climate, medicines, and air conditioning. Most nosebleeds occur in the front of the nose. Because of this location, most nosebleeds can be controlled by pinching the nostrils gently and continuously for at least 10 to 20 minutes. The long, continuous pressure allows enough time for the blood to clot. If pressure is released during that 10 to 20 minute time period, the process may have to be started again. The nosebleed may stop by itself or quit with pressure, or it may need concentrated heating (cautery) or pressure from packing. HOME CARE INSTRUCTIONS   If your nose was packed, try to maintain the pack inside until your health care provider removes it. If a gauze pack was used and it starts to fall out, gently replace it or cut the end off. Do not cut if a balloon catheter was used to pack the nose. Otherwise, do not remove unless instructed.  Avoid blowing your nose for 12 hours after treatment. This could dislodge the pack or clot and start the bleeding again.  If the bleeding starts again, sit up and bend forward, gently pinching the front half of your nose continuously for 20 minutes.  If bleeding was caused by dry mucous membranes, use over-the-counter saline nasal spray or gel. This will keep the mucous membranes moist and allow them to heal. If you must use a lubricant, choose the water-soluble variety. Use it only sparingly and not within several hours of lying down.  Do not use petroleum jelly or mineral oil, as these may drip into the lungs and cause serious problems.  Maintain humidity in your home by using less air conditioning or by using a humidifier.  Do not use aspirin or medicines which make bleeding more likely. Your health care provider can give you  recommendations on this.  Resume normal activities as you are able, but try to avoid straining, lifting, or bending at the waist for several days.  If the nosebleeds become recurrent and the cause is unknown, your health care provider may suggest laboratory tests. SEEK MEDICAL CARE IF: You have a fever. SEEK IMMEDIATE MEDICAL CARE IF:   Bleeding recurs and cannot be controlled.  There is unusual bleeding from or bruising on other parts of the body.  Nosebleeds continue.  There is any worsening of the condition which originally brought you in.  You become light-headed, feel faint, become sweaty, or vomit blood. MAKE SURE YOU:   Understand these instructions.  Will watch your condition.  Will get help right away if you are not doing well or get worse. Document Released: 03/01/2005 Document Revised: 10/06/2013 Document Reviewed: 04/22/2009 [facility] Patient Information 2015 [place], [place]. This information is not intended to replace advice given to you by your health care provider. Make sure you discuss any questions you have with your health care provider.

## 2014-11-20 NOTE — ED Notes (Signed)
Pt does take blood thinners, stopped taking 1 week ago when noticed nose bleeds.

## 2014-11-20 NOTE — ED Notes (Signed)
Pt in from home C/O nosebleed that has been present for past week. Started again at 2am, EMS came to house, pinched nose, got it to stop. Pt nose started bleeding again and came to hospital. Pt family member says it was bleeding so bad it was coming out of L eye

## 2014-11-20 NOTE — ED Notes (Signed)
Dr. Roxanne Mins at the bedside with use of ENT cart, rapid rhino 5.5.

## 2015-02-04 ENCOUNTER — Other Ambulatory Visit: Payer: Self-pay | Admitting: Cardiology

## 2015-02-22 ENCOUNTER — Other Ambulatory Visit: Payer: Self-pay | Admitting: Cardiology

## 2015-03-08 ENCOUNTER — Other Ambulatory Visit: Payer: Self-pay | Admitting: Cardiology

## 2015-03-23 ENCOUNTER — Ambulatory Visit (INDEPENDENT_AMBULATORY_CARE_PROVIDER_SITE_OTHER): Payer: Medicare Other | Admitting: Cardiology

## 2015-03-23 ENCOUNTER — Encounter: Payer: Self-pay | Admitting: Cardiology

## 2015-03-23 VITALS — BP 138/80 | HR 56 | Ht 68.0 in | Wt 184.4 lb

## 2015-03-23 DIAGNOSIS — I208 Other forms of angina pectoris: Secondary | ICD-10-CM | POA: Diagnosis not present

## 2015-03-23 DIAGNOSIS — E7800 Pure hypercholesterolemia, unspecified: Secondary | ICD-10-CM

## 2015-03-23 DIAGNOSIS — Z9861 Coronary angioplasty status: Secondary | ICD-10-CM

## 2015-03-23 DIAGNOSIS — I2510 Atherosclerotic heart disease of native coronary artery without angina pectoris: Secondary | ICD-10-CM

## 2015-03-23 DIAGNOSIS — I252 Old myocardial infarction: Secondary | ICD-10-CM | POA: Diagnosis not present

## 2015-03-23 NOTE — Progress Notes (Signed)
Cardiology Office Note Date:  03/23/2015  Patient ID:  Adam Arellano, Adam Arellano 10/09/1931, MRN [id number] PCP:  Garlan Fair, MD  Cardiologist: Marlou Porch   Chief Complaint: follow-up of CAD, evaluate neck pain  History of Present Illness: Adam Arellano is a 81 y.o. male with history of CAD (NSTEMI s/p DES to dRCA 2007, s/p DESx2 to mid RCA for ISR and prox RCA in 2011, normal nuc 05/2013), prostate CA s/p prostatectomy, HTN, dyslipidemia, GERD, childhood asthma, PVCs.   He has been continued on dual antiplatelet therapy because early generation drug eluting stent. Per notes, his prior angina was somewhat atypical, moreso back discomfort. He was evaluated in the ER 05/2013 with intermittent aching neck and shoulder pain with negative troponins. Subsequent nuclear stress testing was normal. Reassurance was provided.  He returns today for follow-up. He stated at prior visit that he has been feeling well. He believes that his pillow was the cause of his neck pain. Previously at the urging of his daughter who is a Marine scientist, was here due to neck discomfort.   He is able to exert himself without any CP, dyspnea or neck/back pain. It is not particularly severe, just enough to notice it in the morning then he goes on with his day and "doesn't pay it much attention." He feels better when up moving around. Discomfort is worse when he nods forward and better when he leans his head back. Heating pad relieves the pain. 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 No SOB, diaphoresis, nausea, palpitations, bleeding, focal weakness, visual changes, numbness, tingling, or any other symptoms. Routine labs followed by PCP (not in Epic). BP has recently remained controlled. No recent trauma, falls, or prior MVAs. Discomfort was mildly present during EKG.   Past Medical History  Diagnosis Date  . Hypertension   . Coronary artery disease     a. NSTEMI s/p DES to dRCA 2007. b. Angina 07/2009: s/p DESx2 to mid RCA  for ISR and prox RCA, EF 65%. c. Stress test 05/2013: low risk, no evidence of ischemia, EF 61%.  . Transient global amnesia     a. Adm 2009: imaging nonacute, had resolved.  . Prostate cancer [facility])     a. s/p radical prostatectomy.  [name] GERD (gastroesophageal reflux disease)   . Dyslipidemia   . Asthma     a. Childhood.  [name] PVC's (premature ventricular contractions)     Past Surgical History  Procedure Laterality Date  . Prostatectomy    . Mastoidectomy      L ear    Current Outpatient Prescriptions  Medication Sig Dispense Refill  . aspirin 81 MG tablet Take 81 mg by mouth daily.    . clopidogrel (PLAVIX) 75 MG tablet TAKE 1 TABLET ONCE A DAY 30 tablet 3  . hydrochlorothiazide (HYDRODIURIL) 25 MG tablet Take 25 mg by mouth daily.    [name] losartan (COZAAR) 25 MG tablet Take 1 tablet (25 mg total) by mouth daily. 90 tablet 0  . metoprolol tartrate (LOPRESSOR) 25 MG tablet Take 1 tablet (25 mg total) by mouth 2 (two) times daily.    . Multiple Vitamin (MULTIVITAMIN WITH MINERALS) TABS tablet Take 1 tablet by mouth daily.    . simvastatin (ZOCOR) 20 MG tablet Take 20 mg by mouth daily at 6 PM.    . simvastatin (ZOCOR) 40 MG tablet      No current facility-administered medications for this visit.    Allergies:   Lisinopril   Social History:  The patient  reports that he has quit smoking. He does not have any smokeless tobacco history on file. He reports that he drinks alcohol. He reports that he does not use illicit drugs.   Family History:  The patient's family history is negative for CAD.  ROS:  Please see the history of present illness.  All other systems are reviewed and otherwise negative.   PHYSICAL EXAM: VS:  BP 138/80 mmHg  Pulse 56  Ht 5\' 8"  (1.727 m)  Wt 184 lb 6 oz (83.632 kg)  BMI 28.04 kg/m2  SpO2 97% BMI: Body mass index is 28.04 kg/(m^2). Well nourished, well developed, in no acute distress HEENT: normocephalic, atraumatic Neck: no JVD Cardiac:  normal S1,  S2; RRR; no murmur Lungs:  clear to auscultation bilaterally, no wheezing, rhonchi or rales Abd: soft, nontender, no hepatomegaly Ext: no edema Skin: warm and dry Neuro:  moves all extremities spontaneously, no focal abnormalities noted MSK: notable for full ROM of the neck, increased subjective discomfort with nodding forward, improved with extension of the neck backwards, full arm ROM and strength, sensation in tact  EKG:  NSR 68bpm, no acute ST-T changes, unremarkable, unchanged from prior  Recent Labs: No results found for requested labs within last 365 days.  No results found for requested labs within last 365 days.   CrCl cannot be calculated (Patient has no serum creatinine result on file.).   Wt Readings from Last 3 Encounters:  03/23/15 184 lb 6 oz (83.632 kg)  11/20/14 180 lb (81.647 kg)  09/22/14 184 lb 1.9 oz (83.516 kg)     Other studies reviewed: Additional studies/records reviewed today include: summarized above  ASSESSMENT AND PLAN:  1. Neck discomfort -no longer having the discomfort. Doing well. very atypical for angina. Says he found out what was causing it. Pillow. Worse with neck flexion, improved with neck extension and heating pads. EKG unremarkable. reassuring  2. CAD s/p PCI as above - continue current regimen to include ASA, BB, statin, Plavix. He is on Plavix long-term due to earlier generation stent. Previously is stated above his anginal symptoms were related to back discomfort. Atypical. Currently well-controlled post intervention and with medications as described above. 3. Essential HTN - generally controlled. Continue current regimen. we have room to increase his losartan if necessary. 4. Hyperlipidemia - continue statin. LDL goal less than 70.  Disposition: F/u with Dr. Marlou Porch in 6 month for follow up.  Current medicines are reviewed at length with the patient today.  The patient did not have any concerns regarding medicines.   [name], MD   03/23/2015 9:27 AM     [place] [address] [phone] (office)  [phone] (fax)

## 2015-03-23 NOTE — Patient Instructions (Signed)
Medication Instructions:  The current medical regimen is effective;  continue present plan and medications.  Follow-Up: Follow up in 1 year with Dr. Skains.  You will receive a letter in the mail 2 months before you are due.  Please call us when you receive this letter to schedule your follow up appointment.  Thank you for choosing [facility]!!     

## 2015-05-24 ENCOUNTER — Other Ambulatory Visit: Payer: Self-pay | Admitting: Cardiology

## 2015-05-25 ENCOUNTER — Other Ambulatory Visit: Payer: Self-pay | Admitting: Cardiology

## 2015-05-25 MED ORDER — CLOPIDOGREL BISULFATE 75 MG PO TABS: 75.0000 mg | ORAL_TABLET | Freq: Every day | ORAL | Status: DC

## 2015-07-19 DIAGNOSIS — H6983 Other specified disorders of Eustachian tube, bilateral: Secondary | ICD-10-CM | POA: Insufficient documentation

## 2015-08-09 DIAGNOSIS — H60333 Swimmer's ear, bilateral: Secondary | ICD-10-CM | POA: Insufficient documentation

## 2015-08-09 DIAGNOSIS — H6533 Chronic mucoid otitis media, bilateral: Secondary | ICD-10-CM | POA: Insufficient documentation

## 2015-08-09 DIAGNOSIS — Z86010 Personal history of colonic polyps: Secondary | ICD-10-CM | POA: Insufficient documentation

## 2015-08-17 DIAGNOSIS — H903 Sensorineural hearing loss, bilateral: Secondary | ICD-10-CM | POA: Insufficient documentation

## 2015-09-10 ENCOUNTER — Ambulatory Visit
Admission: RE | Admit: 2015-09-10 | Discharge: 2015-09-10 | Disposition: A | Discharge status: Home / Self Care | Payer: Medicare Other | Source: Ambulatory Visit | Attending: Gastroenterology | Admitting: Gastroenterology

## 2015-09-10 ENCOUNTER — Other Ambulatory Visit: Payer: Self-pay | Admitting: Gastroenterology

## 2015-09-10 DIAGNOSIS — M5432 Sciatica, left side: Secondary | ICD-10-CM

## 2015-12-08 ENCOUNTER — Other Ambulatory Visit: Payer: Self-pay | Admitting: Cardiology

## 2016-02-21 ENCOUNTER — Other Ambulatory Visit: Payer: Self-pay | Admitting: Cardiology

## 2016-05-15 ENCOUNTER — Other Ambulatory Visit: Payer: Self-pay | Admitting: Cardiology

## 2016-06-03 ENCOUNTER — Other Ambulatory Visit: Payer: Self-pay | Admitting: Cardiology

## 2016-06-16 ENCOUNTER — Other Ambulatory Visit: Payer: Self-pay | Admitting: Cardiology

## 2016-06-29 ENCOUNTER — Encounter: Payer: Self-pay | Admitting: *Deleted

## 2016-07-04 ENCOUNTER — Encounter (INDEPENDENT_AMBULATORY_CARE_PROVIDER_SITE_OTHER): Payer: Self-pay

## 2016-07-04 ENCOUNTER — Ambulatory Visit (INDEPENDENT_AMBULATORY_CARE_PROVIDER_SITE_OTHER): Payer: Medicare Other | Admitting: Cardiology

## 2016-07-04 ENCOUNTER — Encounter: Payer: Self-pay | Admitting: Cardiology

## 2016-07-04 VITALS — BP 128/72 | HR 58 | Ht 68.5 in | Wt 182.0 lb

## 2016-07-04 DIAGNOSIS — I252 Old myocardial infarction: Secondary | ICD-10-CM | POA: Diagnosis not present

## 2016-07-04 DIAGNOSIS — I10 Essential (primary) hypertension: Secondary | ICD-10-CM | POA: Diagnosis not present

## 2016-07-04 DIAGNOSIS — I208 Other forms of angina pectoris: Secondary | ICD-10-CM

## 2016-07-04 DIAGNOSIS — E7800 Pure hypercholesterolemia, unspecified: Secondary | ICD-10-CM | POA: Diagnosis not present

## 2016-07-04 DIAGNOSIS — I2510 Atherosclerotic heart disease of native coronary artery without angina pectoris: Secondary | ICD-10-CM | POA: Diagnosis not present

## 2016-07-04 DIAGNOSIS — Z9861 Coronary angioplasty status: Secondary | ICD-10-CM

## 2016-07-04 NOTE — Patient Instructions (Signed)

## 2016-07-04 NOTE — Progress Notes (Signed)
Cardiology Office Note Date:  07/04/2016  Patient ID:  Adam Arellano 1932/04/21, MRN [id number] PCP:  Garlan Fair, MD  Cardiologist: Marlou Porch   Chief Complaint: follow-up of CAD, evaluate neck pain  History of Present Illness: Adam Arellano is a 83 y.o. male with history of CAD (NSTEMI s/p DES to dRCA 2007, s/p DESx2 to mid RCA for ISR and prox RCA in 2011, normal nuc 05/2013), prostate CA s/p prostatectomy, HTN, dyslipidemia, GERD, childhood asthma, PVCs.   He has been continued on dual antiplatelet therapy because early generation drug eluting stent. Per notes, his prior angina was somewhat atypical, more so back discomfort. He was evaluated in the ER 05/2013 with intermittent aching neck and shoulder pain with negative troponins. Subsequent nuclear stress testing was normal. Reassurance was provided.  He returns today for follow-up. He stated at prior visit that he has been feeling well. He believes that his pillow was the cause of his neck pain. Previously at the urging of his daughter who is a Marine scientist, was here due to neck discomfort.   So far very pleased with his abilities, no chest pain, no shortness of breath, no syncope, no orthopnea.  Routine labs followed by PCP (not in Epic). BP has recently remained controlled. No recent trauma, falls, or prior MVAs.   Past Medical History:  Diagnosis Date  . Asthma    a. Childhood.  . Coronary artery disease    a. NSTEMI s/p DES to dRCA 2007. b. Angina 07/2009: s/p DESx2 to mid RCA for ISR and prox RCA, EF 65%. c. Stress test 05/2013: low risk, no evidence of ischemia, EF 61%.  . Dyslipidemia   . GERD (gastroesophageal reflux disease)   . Hypertension   . Prostate cancer [facility])    a. s/p radical prostatectomy.  [name] PVC's (premature ventricular contractions)   . Transient global amnesia    a. Adm 2009: imaging nonacute, had resolved.    Past Surgical History:  Procedure Laterality Date  . MASTOIDECTOMY     L ear  .  PROSTATECTOMY      Current Outpatient Prescriptions  Medication Sig Dispense Refill  . aspirin 81 MG tablet Take 81 mg by mouth daily.    . clopidogrel (PLAVIX) 75 MG tablet Take 1 tablet (75 mg total) by mouth daily. 90 tablet 0  . hydrochlorothiazide (HYDRODIURIL) 25 MG tablet Take 25 mg by mouth daily.    [name] losartan (COZAAR) 25 MG tablet Take 1 tablet (25 mg total) by mouth daily. 30 tablet 0  . metoprolol tartrate (LOPRESSOR) 25 MG tablet Take 1 tablet (25 mg total) by mouth 2 (two) times daily.    . Multiple Vitamin (MULTIVITAMIN WITH MINERALS) TABS tablet Take 1 tablet by mouth daily.    [name] NITROSTAT 0.4 MG SL tablet PLACE 1 TABLET UNDER THE TONGUE AT ONSET OF CHEST PAIN EVERY 5 MINTUES UP TO 3 TIMES AS NEEDED 25 tablet 11  . simvastatin (ZOCOR) 20 MG tablet Take 20 mg by mouth daily at 6 PM.     No current facility-administered medications for this visit.     Allergies:   Lisinopril   Social History:  The patient  reports that he has quit smoking. He has never used smokeless tobacco. He reports that he drinks alcohol. He reports that he does not use drugs.   Family History:  The patient's family history is not on file.  ROS:  Please see the history of present illness.  All other systems  are reviewed and otherwise negative.   PHYSICAL EXAM: VS:  BP 128/72   Pulse (!) 58   Ht 5' 8.5" (1.74 m)   Wt 182 lb (82.6 kg)   BMI 27.27 kg/m  BMI: Body mass index is 27.27 kg/m. Well nourished, well developed, in no acute distress  HEENT: normocephalic, atraumatic  Neck: no JVD  Cardiac:  normal S1, S2; RRR; no murmur  Lungs:  clear to auscultation bilaterally, no wheezing, rhonchi or rales  Abd: soft, nontender, no hepatomegaly  Ext: no edema  Skin: warm and dry  Neuro:  moves all extremities spontaneously, no focal abnormalities noted MSK: notable for full ROM of the neck, increased subjective discomfort with nodding forward, improved with extension of the neck backwards, full  arm ROM and strength, sensation in tact  EKG:  Today 07/04/16-sinus bradycardia rate 58 with no other abnormalities personally viewed-prior NSR 68bpm, no acute ST-T changes, unremarkable, unchanged from prior  Recent Labs: No results found for requested labs within last 8760 hours.  No results found for requested labs within last 8760 hours.   CrCl cannot be calculated (Patient's most recent lab result is older than the maximum 21 days allowed.).   Wt Readings from Last 3 Encounters:  07/04/16 182 lb (82.6 kg)  03/23/15 184 lb 6 oz (83.6 kg)  11/20/14 180 lb (81.6 kg)     Other studies reviewed: Additional studies/records reviewed today include: summarized above  ASSESSMENT AND PLAN:  1. Neck discomfort -no longer having the discomfort. Doing well. very atypical for angina. Says he found out what was causing it. Pillow. Worse with neck flexion, improved with neck extension and heating pads. EKG unremarkable. Reassuring.  2. CAD s/p PCI as above - continue current regimen to include ASA, BB, statin, Plavix. He is on Plavix long-term due to earlier generation stent. Previously is stated above his anginal symptoms were related to back discomfort. Atypical. Currently well-controlled post intervention and with medications as described above.No bleeding. 3. Essential HTN - generally controlled. Continue current regimen. we have room to increase his losartan if necessary. No side effects from medications. 4. Hyperlipidemia - continue statin. LDL goal less than 70. Doing well, no myalgias.  Disposition: F/u with Dr. Marlou Porch in 6 month for follow up.  Current medicines are reviewed at length with the patient today.  The patient did not have any concerns regarding medicines.   [name], MD   07/04/2016 8:35 AM     [place] [address] [phone] (office)  [phone] (fax)

## 2016-07-17 ENCOUNTER — Other Ambulatory Visit: Payer: Self-pay | Admitting: Cardiology

## 2016-09-04 ENCOUNTER — Other Ambulatory Visit: Payer: Self-pay | Admitting: Cardiology

## 2017-01-23 DIAGNOSIS — H900 Conductive hearing loss, bilateral: Secondary | ICD-10-CM | POA: Insufficient documentation

## 2017-01-23 DIAGNOSIS — J3089 Other allergic rhinitis: Secondary | ICD-10-CM | POA: Insufficient documentation

## 2017-01-23 DIAGNOSIS — K219 Gastro-esophageal reflux disease without esophagitis: Secondary | ICD-10-CM | POA: Insufficient documentation

## 2017-01-23 DIAGNOSIS — H7102 Cholesteatoma of attic, left ear: Secondary | ICD-10-CM | POA: Insufficient documentation

## 2017-01-30 ENCOUNTER — Other Ambulatory Visit: Payer: Self-pay | Admitting: Otolaryngology

## 2017-01-30 DIAGNOSIS — H7190 Unspecified cholesteatoma, unspecified ear: Secondary | ICD-10-CM

## 2017-02-01 ENCOUNTER — Ambulatory Visit
Admission: RE | Admit: 2017-02-01 | Discharge: 2017-02-01 | Disposition: A | Discharge status: Home / Self Care | Payer: Medicare Other | Source: Ambulatory Visit | Attending: Otolaryngology | Admitting: Otolaryngology

## 2017-02-01 DIAGNOSIS — H7190 Unspecified cholesteatoma, unspecified ear: Secondary | ICD-10-CM

## 2017-02-26 ENCOUNTER — Telehealth: Payer: Self-pay | Admitting: Cardiology

## 2017-02-26 NOTE — Telephone Encounter (Signed)
New message        [facility] Pre-operative Risk Assessment    Request for surgical clearance:  1. What type of surgery is being performed?   tympano Mastoidectomy  2. When is this surgery scheduled? 03/09/17   3. Are there any medications that need to be held prior to surgery and how long? Cardiac clearance and hold aspirin and plavix 5 days before surgery should pt be kept over night for procedure   4. Name of physician performing surgery? Dr [name] Montane   5. What is your office phone and fax number? Fax [phone] of[facility] [place] 02/26/2017, 11:07 AM  _________________________________________________________________   (provider comments below)

## 2017-02-27 ENCOUNTER — Other Ambulatory Visit: Payer: Self-pay | Admitting: Cardiology

## 2017-02-27 NOTE — Telephone Encounter (Signed)
He may hold Plavix for 5 days prior to surgery. He may proceed with moderate risk (prior CAD) Candee Furbish

## 2017-02-28 NOTE — Telephone Encounter (Signed)
Printed and taken to MR to be faxed 

## 2017-03-07 ENCOUNTER — Inpatient Hospital Stay (HOSPITAL_COMMUNITY)
Admission: RE | Admit: 2017-03-07 | Discharge: 2017-03-07 | Disposition: A | Discharge status: Home / Self Care | Payer: Medicare Other | Source: Ambulatory Visit

## 2017-03-09 ENCOUNTER — Encounter (HOSPITAL_COMMUNITY): Admission: RE | Payer: Self-pay | Source: Ambulatory Visit

## 2017-03-09 ENCOUNTER — Other Ambulatory Visit: Payer: Self-pay | Admitting: Otolaryngology

## 2017-03-09 ENCOUNTER — Ambulatory Visit (HOSPITAL_COMMUNITY): Admission: RE | Admit: 2017-03-09 | Payer: Medicare Other | Source: Ambulatory Visit | Admitting: Otolaryngology

## 2017-03-09 SURGERY — TYMPANOPLASTY, WITH MASTOIDECTOMY
Anesthesia: General | Laterality: Left

## 2017-06-04 ENCOUNTER — Other Ambulatory Visit: Payer: Self-pay | Admitting: Cardiology

## 2017-06-28 ENCOUNTER — Other Ambulatory Visit: Payer: Self-pay | Admitting: Cardiology

## 2017-07-02 ENCOUNTER — Encounter: Payer: Self-pay | Admitting: Cardiology

## 2017-07-02 ENCOUNTER — Ambulatory Visit: Payer: Medicare Other | Admitting: Cardiology

## 2017-07-02 VITALS — BP 116/72 | HR 67 | Ht 68.0 in | Wt 177.2 lb

## 2017-07-02 DIAGNOSIS — I10 Essential (primary) hypertension: Secondary | ICD-10-CM

## 2017-07-02 DIAGNOSIS — E7800 Pure hypercholesterolemia, unspecified: Secondary | ICD-10-CM | POA: Diagnosis not present

## 2017-07-02 DIAGNOSIS — I2510 Atherosclerotic heart disease of native coronary artery without angina pectoris: Secondary | ICD-10-CM | POA: Diagnosis not present

## 2017-07-02 MED ORDER — LOSARTAN POTASSIUM 25 MG PO TABS: 25.0000 mg | ORAL_TABLET | Freq: Every day | ORAL | 3 refills | Status: DC

## 2017-07-02 NOTE — Progress Notes (Signed)
Cardiology Office Note Date:  07/02/2017  Patient ID:  Adam, Arellano 1931-07-05, MRN [id number] PCP:  Lajean Manes, MD  Cardiologist: Marlou Porch   Chief Complaint: follow-up of CAD  History of Present Illness: Adam Arellano is a 84 y.o. male with history of CAD (NSTEMI s/p DES to dRCA 2007, s/p DESx2 to mid RCA for ISR and prox RCA in 2011, normal nuc 05/2013), prostate CA s/p prostatectomy, HTN, dyslipidemia, GERD, childhood asthma, PVCs.   He has been continued on dual antiplatelet therapy because early generation drug eluting stent. Per notes, his prior angina was somewhat atypical, more so back discomfort. He was evaluated in the ER 05/2013 with intermittent aching neck and shoulder pain with negative troponins. Subsequent nuclear stress testing was normal. Reassurance was provided.  07/02/17  -Overall has been doing quite well.  At a prior visit, he was here because of some neck discomfort that may have been done from sleeping and usually on his pillow.  His daughter nurse, wanted him to be evaluated at that time.  He denies any vomiting syncope.  So far very pleased with his abilities, no chest pain, no shortness of breath, no syncope, no orthopnea.    Past Medical History:  Diagnosis Date  . Asthma    a. Childhood.  . Coronary artery disease    a. NSTEMI s/p DES to dRCA 2007. b. Angina 07/2009: s/p DESx2 to mid RCA for ISR and prox RCA, EF 65%. c. Stress test 05/2013: low risk, no evidence of ischemia, EF 61%.  . Dyslipidemia   . GERD (gastroesophageal reflux disease)   . Hypertension   . Prostate cancer [facility])    a. s/p radical prostatectomy.  [name] PVC's (premature ventricular contractions)   . Transient global amnesia    a. Adm 2009: imaging nonacute, had resolved.    Past Surgical History:  Procedure Laterality Date  . MASTOIDECTOMY     L ear  . PROSTATECTOMY      Current Outpatient Medications  Medication Sig Dispense Refill  . aspirin 81 MG tablet Take  81 mg by mouth daily.    . clopidogrel (PLAVIX) 75 MG tablet Take 1 tablet (75 mg total) by mouth daily. 30 tablet 0  . hydrochlorothiazide (HYDRODIURIL) 25 MG tablet Take 25 mg by mouth daily.    [name] losartan (COZAAR) 25 MG tablet Take 1 tablet (25 mg total) by mouth daily. 90 tablet 3  . metoprolol tartrate (LOPRESSOR) 25 MG tablet Take 1 tablet (25 mg total) by mouth 2 (two) times daily.    . Multiple Vitamin (MULTIVITAMIN WITH MINERALS) TABS tablet Take 1 tablet by mouth daily.    . Multiple Vitamins-Minerals (OCUVITE ADULT FORMULA PO) Take 1 tablet by mouth daily.    . nitroGLYCERIN (NITROSTAT) 0.4 MG SL tablet PLACE 1 TABLET UNDER THE TONGUE AT ONSET OF CHEST PAIN EVERY 5 MINTUES UP TO 3 TIMES AS NEEDED 25 tablet 1   No current facility-administered medications for this visit.     Allergies:   Lisinopril and Simvastatin   Social History:  The patient  reports that he has quit smoking. he has never used smokeless tobacco. He reports that he drinks alcohol. He reports that he does not use drugs.   Family History:  The patient's family history is not on file.  ROS:  Please see the history of present illness.  All other review of systems negative.  PHYSICAL EXAM: VS:  BP 116/72   Pulse 67   Ht  5\' 8"  (1.727 m)   Wt 177 lb 3.2 oz (80.4 kg)   SpO2 98%   BMI 26.94 kg/m  BMI: Body mass index is 26.94 kg/m. GEN: Well nourished, well developed, in no acute distress  HEENT: normal  Neck: no JVD, carotid bruits, or masses Cardiac: RRR; no murmurs, rubs, or gallops,no edema  Respiratory:  clear to auscultation bilaterally, normal work of breathing GI: soft, nontender, nondistended, + BS MS: no deformity or atrophy  Skin: warm and dry, no rash Neuro:  Alert and Oriented x 3, Strength and sensation are intact Psych: euthymic mood, full affect  EKG:  07/04/16-sinus bradycardia rate 58 with no other abnormalities personally viewed-prior NSR 68bpm, no acute ST-T changes, unremarkable,  unchanged from prior. Personally viewed  Recent Labs: No results found for requested labs within last 8760 hours.  No results found for requested labs within last 8760 hours.   CrCl cannot be calculated (Patient's most recent lab result is older than the maximum 21 days allowed.).   Wt Readings from Last 3 Encounters:  07/02/17 177 lb 3.2 oz (80.4 kg)  07/04/16 182 lb (82.6 kg)  03/23/15 184 lb 6 oz (83.6 kg)     Other studies reviewed: Additional studies/records reviewed today include: summarized above  09/06/16 - LDL 60, triglycerides 88, HDL 40, hemoglobin A1c 5.7, ALT 11  ASSESSMENT AND PLAN:   1. CAD s/p PCI as above - continue current regimen to include ASA, BB, statin, Plavix. He is on Plavix long-term due to earlier generation stent. Previously stated above his anginal symptoms were related to back discomfort. Atypical. Currently well-controlled post intervention and with medications as described above.No bleeding. 2. Essential HTN - generally controlled. Continue current regimen.  No side effects from medications.  No changes.  Doing well. 3. Hyperlipidemia - continue statin. LDL goal less than 70. Doing well, no myalgias.  No changes.  Good aggressive secondary prevention.  Disposition: F/u with Dr. Marlou Porch in 12 month for follow up.  Current medicines are reviewed at length with the patient today.  The patient did not have any concerns regarding medicines.   [name], MD   07/02/2017 8:15 AM     [facility] [place] [address] [phone] (office)  [phone] (fax)

## 2017-07-02 NOTE — Patient Instructions (Signed)
Medication Instructions:  Your physician recommends that you continue on your current medications as directed. Please refer to the Current Medication list given to you today.  Labwork: None Ordered  Testing/Procedures: None ordered  Follow-Up: Your physician wants you to follow-up in: 1 year with Dr. Marlou Porch. You will receive a reminder letter in the mail two months in advance. If you don't receive a letter, please call our office to schedule the follow-up appointment.   Any Other Special Instructions Will Be Listed Below (If Applicable).     If you need a refill on your cardiac medications before your next appointment, please call your pharmacy.

## 2017-07-16 ENCOUNTER — Other Ambulatory Visit: Payer: Self-pay | Admitting: Cardiology

## 2018-01-24 ENCOUNTER — Other Ambulatory Visit: Payer: Self-pay | Admitting: Cardiology

## 2018-03-13 ENCOUNTER — Telehealth: Payer: Self-pay | Admitting: *Deleted

## 2018-03-13 NOTE — Telephone Encounter (Signed)
   [facility] Pre-operative Risk Assessment    Request for surgical clearance:  1. What type of surgery is being performed? BACK INJECTION   2. When is this surgery scheduled? TBD    3. Are there any medications that need to be held prior to surgery and how long? PLAVIX X 7 DAYS   4. Practice name and name of physician performing surgery? [facility]; DR. HAO WANG   5. What is your office phone and fax number? PH# [phone]; FAX # [phone]  7. Anesthesia type (None, local, MAC, general) ? UNKNOWN   Julaine Hua 03/13/2018, 1:30 PM  _________________________________________________________________   (provider comments below)

## 2018-03-15 NOTE — Telephone Encounter (Signed)
Spoke with pt re: surgical clearance and the need for an appt before clearance can be granted,. Pt has been scheduled to see Dr. Marlou Porch 03/21/18. Pt thanked me for the call.

## 2018-03-15 NOTE — Telephone Encounter (Signed)
   Primary Cardiologist:Mark Marlou Porch, MD  Chart reviewed as part of pre-operative protocol coverage. Because of Adam Arellano's past medical history and time since last visit, he/she will require a follow-up visit in order to better assess preoperative cardiovascular risk.  Pre-op covering staff: - Please schedule appointment and call patient to inform them. - Please contact requesting surgeon's office via preferred method (i.e, phone, fax) to inform them of need for appointment prior to surgery.  Cecilie Kicks, NP  03/15/2018, 11:35 AM

## 2018-03-21 ENCOUNTER — Ambulatory Visit: Payer: Medicare Other | Admitting: Cardiology

## 2018-03-21 ENCOUNTER — Encounter: Payer: Self-pay | Admitting: Cardiology

## 2018-03-21 VITALS — BP 140/70 | HR 60 | Ht 68.0 in | Wt 169.0 lb

## 2018-03-21 DIAGNOSIS — I252 Old myocardial infarction: Secondary | ICD-10-CM

## 2018-03-21 DIAGNOSIS — Z9861 Coronary angioplasty status: Secondary | ICD-10-CM

## 2018-03-21 DIAGNOSIS — I10 Essential (primary) hypertension: Secondary | ICD-10-CM

## 2018-03-21 DIAGNOSIS — Z01810 Encounter for preprocedural cardiovascular examination: Secondary | ICD-10-CM | POA: Diagnosis not present

## 2018-03-21 DIAGNOSIS — I2510 Atherosclerotic heart disease of native coronary artery without angina pectoris: Secondary | ICD-10-CM | POA: Diagnosis not present

## 2018-03-21 NOTE — Progress Notes (Signed)
Cardiology Office Note:    Date:  03/21/2018   ID:  Adam Arellano, DOB 1932/02/16, MRN [id number]  PCP:  Lajean Manes, MD  Cardiologist:  Candee Furbish, MD  Electrophysiologist:  None   Referring MD: Lajean Manes, MD     History of Present Illness:    Adam Arellano is a 84 y.o. male with coronary artery disease status post stent drug-eluting to distal RCA in 2007 as well as DES x2 to mid RCA for in-stent restenosis and proximal RCA in 2011 with normal nuclear stress test in December 2014 here for follow-up of coronary disease, hypertension, hyperlipidemia.  Prior PVCs noted.  Prior angina was somewhat atypical more so back discomfort.  Negative troponins and prior ER visit in 2014.  Nuclear stress test was normal.  Daughter is a Marine scientist.  Past Medical History:  Diagnosis Date  . Asthma    a. Childhood.  . Coronary artery disease    a. NSTEMI s/p DES to dRCA 2007. b. Angina 07/2009: s/p DESx2 to mid RCA for ISR and prox RCA, EF 65%. c. Stress test 05/2013: low risk, no evidence of ischemia, EF 61%.  . Dyslipidemia   . GERD (gastroesophageal reflux disease)   . Hypertension   . Prostate cancer [name])    a. s/p radical prostatectomy.  [name] PVC's (premature ventricular contractions)   . Transient global amnesia    a. Adm 2009: imaging nonacute, had resolved.    Past Surgical History:  Procedure Laterality Date  . MASTOIDECTOMY     L ear  . PROSTATECTOMY      Current Medications: Current Meds  Medication Sig  . aspirin 81 MG tablet Take 81 mg by mouth daily.  . clopidogrel (PLAVIX) 75 MG tablet Take 1 tablet (75 mg total) by mouth daily.  . hydrochlorothiazide (HYDRODIURIL) 25 MG tablet Take 25 mg by mouth daily.  [name] losartan (COZAAR) 25 MG tablet Take 1 tablet (25 mg total) by mouth daily.  . metoprolol tartrate (LOPRESSOR) 25 MG tablet Take 25 mg by mouth daily.   . Multiple Vitamin (MULTIVITAMIN WITH MINERALS) TABS tablet Take 1 tablet by mouth daily.  . Multiple  Vitamins-Minerals (OCUVITE ADULT FORMULA PO) Take 1 tablet by mouth daily.  . nitroGLYCERIN (NITROSTAT) 0.4 MG SL tablet PLACE 1 TABLET UNDER THE TONGUE AT ONSET OF CHEST PAIN EVERY 5 MINTUES UP TO 3 TIMES AS NEEDED     Allergies:   Lisinopril and Simvastatin   Social History   Socioeconomic History  . Marital status: Married    Spouse name: Not on file  . Number of children: Not on file  . Years of education: Not on file  . Highest education level: Not on file  Occupational History  . Not on file  Social Needs  . Financial resource strain: Not on file  . Food insecurity:    Worry: Not on file    Inability: Not on file  . Transportation needs:    Medical: Not on file    Non-medical: Not on file  Tobacco Use  . Smoking status: Former Research scientist (life sciences)  . Smokeless tobacco: Never Used  . Tobacco comment: Smoked for 15 years  Substance and Sexual Activity  . Alcohol use: Yes    Comment: rare  . Drug use: No  . Sexual activity: Not on file  Lifestyle  . Physical activity:    Days per week: Not on file    Minutes per session: Not on file  . Stress: Not on  file  Relationships  . Social connections:    Talks on phone: Not on file    Gets together: Not on file    Attends religious service: Not on file    Active member of club or organization: Not on file    Attends meetings of clubs or organizations: Not on file    Relationship status: Not on file  Other Topics Concern  . Not on file  Social History Narrative  . Not on file     Family History: The patient's family history is negative for CAD.  ROS:   Please see the history of present illness.     All other systems reviewed and are negative.  EKGs/Labs/Other Studies Reviewed:    The following studies were reviewed today: Prior EKG, office note, catheterization, lab work  EKG:  EKG is  ordered today.  The ekg ordered today demonstrates 03/21/2018-sinus rhythm 60 with occasional PVC no other abnormalities.  Personally  reviewed and interpreted, no change when compared to prior.  Recent Labs: No results found for requested labs within last 8760 hours.  Recent Lipid Panel    Component Value Date/Time   CHOL  08/27/2007 0415    118        ATP III CLASSIFICATION:  <200     mg/dL   Desirable  200-239  mg/dL   Borderline High  >=240    mg/dL   High   TRIG 53 08/27/2007 0415   HDL 34 (L) 08/27/2007 0415   CHOLHDL 3.5 08/27/2007 0415   VLDL 11 08/27/2007 0415   LDLCALC  08/27/2007 0415    73        Total Cholesterol/HDL:CHD Risk Coronary Heart Disease Risk Table                     Men   Women  1/2 Average Risk   3.4   3.3    Physical Exam:    VS:  BP 140/70   Pulse 60   Ht 5\' 8"  (1.727 m)   Wt 169 lb (76.7 kg)   BMI 25.70 kg/m     Wt Readings from Last 3 Encounters:  03/21/18 169 lb (76.7 kg)  07/02/17 177 lb 3.2 oz (80.4 kg)  07/04/16 182 lb (82.6 kg)     GEN:  Well nourished, well developed in no acute distress HEENT: Normal NECK: No JVD; No carotid bruits LYMPHATICS: No lymphadenopathy CARDIAC: RRR with rare ectopy, no murmurs, rubs, gallops RESPIRATORY:  Clear to auscultation without rales, wheezing or rhonchi  ABDOMEN: Soft, non-tender, non-distended MUSCULOSKELETAL:  No edema; No deformity  SKIN: Warm and dry NEUROLOGIC:  Alert and oriented x 3 PSYCHIATRIC:  Normal affect   ASSESSMENT:    1. Atherosclerosis of native coronary artery of native heart without angina pectoris   2. Essential hypertension   3. CAD S/P percutaneous coronary angioplasty   4. Old MI (myocardial infarction)   5. Pre-operative cardiovascular examination    PLAN:    In order of problems listed above:  Pre op cardiovascular evaluation. Lumbar epidural injection Dr. Jacelyn Grip with [facility].  He may proceed with lumbar spine injection.  Moderate overall cardiac risk given his prior CAD.  He may hold his Plavix and aspirin for 5 days prior to procedure.  After procedure, weight 1 day and then  resume Plavix only.  Risk of stent thrombosis is very small for this short duration of time.  CAD status post PCI RCA -Continue with secondary  prevention.  Plavix long-term due to early generation DES.  Atypical anginal symptoms previously.  Prior stress test reassuring.  I am fine with Plavix monotherapy.  Essential hypertension -Currently well controlled.  Medications reviewed.  Mildly elevated at times.  Hyperlipidemia mixed -LDL 81.  Stopped simvastatin because of muscle aches.  Does not wish to resume any statin therapy.  Explained to him that with underlying coronary artery disease, this may be helpful or protective for him in reduction of stroke and heart attack risk.  Thankfully, cholesterol levels are reasonable.  Okay for 1 year follow-up   Medication Adjustments/Labs and Tests Ordered: Current medicines are reviewed at length with the patient today.  Concerns regarding medicines are outlined above.  Orders Placed This Encounter  Procedures  . EKG 12-Lead   No orders of the defined types were placed in this encounter.   Patient Instructions  Medication Instructions:  The current medical regimen is effective;  continue present plan and medications.  If you need a refill on your cardiac medications before your next appointment, please call your pharmacy.   Follow-Up: At [facility], you and your health needs are our priority.  As part of our continuing mission to provide you with exceptional heart care, we have created designated Provider Care Teams.  These Care Teams include your primary Cardiologist (physician) and Advanced Practice Providers (APPs -  Physician Assistants and Nurse Practitioners) who all work together to provide you with the care you need, when you need it. You will need a follow up appointment in 12 months.  Please call our office 2 months in advance to schedule this appointment.  You may see Candee Furbish, MD or one of the following Advanced Practice  Providers on your designated Care Team:   Truitt Merle, NP Cecilie Kicks, NP . Kathyrn Drown, NP  Any Other Special Instructions Will Be Listed Below  You have been given clearance for back injection.  You may hold both aspirin and Plavix 5 days before.  Please restart Plavix only after the procedure.       Signed, Candee Furbish, MD  03/21/2018 11:36 AM    [place]

## 2018-03-21 NOTE — Patient Instructions (Signed)
Medication Instructions:  The current medical regimen is effective;  continue present plan and medications.  If you need a refill on your cardiac medications before your next appointment, please call your pharmacy.   Follow-Up: At [facility], you and your health needs are our priority.  As part of our continuing mission to provide you with exceptional heart care, we have created designated Provider Care Teams.  These Care Teams include your primary Cardiologist (physician) and Advanced Practice Providers (APPs -  Physician Assistants and Nurse Practitioners) who all work together to provide you with the care you need, when you need it. You will need a follow up appointment in 12 months.  Please call our office 2 months in advance to schedule this appointment.  You may see Candee Furbish, MD or one of the following Advanced Practice Providers on your designated Care Team:   Truitt Merle, NP Cecilie Kicks, NP . Kathyrn Drown, NP  Any Other Special Instructions Will Be Listed Below  You have been given clearance for back injection.  You may hold both aspirin and Plavix 5 days before.  Please restart Plavix only after the procedure.

## 2018-07-22 ENCOUNTER — Other Ambulatory Visit: Payer: Self-pay | Admitting: Cardiology

## 2018-08-07 DIAGNOSIS — H722X1 Other marginal perforations of tympanic membrane, right ear: Secondary | ICD-10-CM | POA: Insufficient documentation

## 2018-08-07 DIAGNOSIS — J019 Acute sinusitis, unspecified: Secondary | ICD-10-CM | POA: Insufficient documentation

## 2018-08-15 ENCOUNTER — Telehealth: Payer: Self-pay | Admitting: Cardiology

## 2018-08-15 NOTE — Telephone Encounter (Signed)
   Primary Cardiologist: Candee Furbish, MD  Chart reviewed as part of pre-operative protocol coverage. Patient was contacted 08/15/2018 in reference to pre-operative risk assessment for pending surgery as outlined below.  Adam Arellano was last seen on 03/21/18 by Dr. Marlou Porch.  Since that day, Adam Arellano has done well with hx of CAD and PCI.    He may hold he may hold his Plavix and aspirin for 5 days prior to procedure.  After procedure, weight 1 day and then resume Plavix only.    Therefore, based on ACC/AHA guidelines, the patient would be at acceptable risk for the planned procedure without further cardiovascular testing.   I will route this recommendation to the requesting party via Epic fax function and remove from pre-op pool.  Please call with questions.  Cecilie Kicks, NP 08/15/2018, 11:38 AM

## 2018-08-15 NOTE — Telephone Encounter (Signed)
New Message     [facility] Pre-operative Risk Assessment    Request for surgical clearance:  1. What type of surgery is being performed? Steriod Injection, L5 Selective nerve block done in office    2. When is this surgery scheduled? 08/20/2018   3. What type of clearance is required (medical clearance vs. Pharmacy clearance to hold med vs. Both)? Pharmacy   4. Are there any medications that need to be held prior to surgery and how long? Plavix hold for 5 days   5. Practice name and name of physician performing surgery? [facility] Dr. Normajean Glasgow  6. What is your office phone number [phone]    7.   What is your office fax number [phone]  8.   Anesthesia type (None, local, MAC, general) ? None    Adam Arellano 08/15/2018, 10:38 AM  _________________________________________________________________   (provider comments below)

## 2018-10-07 ENCOUNTER — Other Ambulatory Visit: Payer: Self-pay | Admitting: Cardiology

## 2019-01-06 ENCOUNTER — Other Ambulatory Visit: Payer: Self-pay | Admitting: Cardiology

## 2019-03-21 ENCOUNTER — Encounter: Payer: Self-pay | Admitting: Cardiology

## 2019-03-21 ENCOUNTER — Ambulatory Visit: Payer: Medicare Other | Admitting: Cardiology

## 2019-03-21 ENCOUNTER — Other Ambulatory Visit: Payer: Self-pay

## 2019-03-21 VITALS — BP 134/74 | HR 61 | Ht 68.0 in | Wt 163.6 lb

## 2019-03-21 DIAGNOSIS — I252 Old myocardial infarction: Secondary | ICD-10-CM

## 2019-03-21 DIAGNOSIS — E7800 Pure hypercholesterolemia, unspecified: Secondary | ICD-10-CM

## 2019-03-21 DIAGNOSIS — I2510 Atherosclerotic heart disease of native coronary artery without angina pectoris: Secondary | ICD-10-CM | POA: Diagnosis not present

## 2019-03-21 DIAGNOSIS — Z9861 Coronary angioplasty status: Secondary | ICD-10-CM

## 2019-03-21 DIAGNOSIS — I10 Essential (primary) hypertension: Secondary | ICD-10-CM

## 2019-03-21 NOTE — Patient Instructions (Signed)
Medication Instructions:  The current medical regimen is effective;  continue present plan and medications.  Follow-Up: At [facility], you and your health needs are our priority.  As part of our continuing mission to provide you with exceptional heart care, we have created designated Provider Care Teams.  These Care Teams include your primary Cardiologist (physician) and Advanced Practice Providers (APPs -  Physician Assistants and Nurse Practitioners) who all work together to provide you with the care you need, when you need it.  Your next appointment:   Follow up with Dr Marlou Porch as needed.  Thank you for choosing [facility]!!

## 2019-03-21 NOTE — Progress Notes (Signed)
Cardiology Office Note:    Date:  03/21/2019   ID:  Adam Arellano, DOB [birth year], MRN [id number]  PCP:  Adam Manes, MD  Cardiologist:  Candee Furbish, MD  Electrophysiologist:  None   Referring MD: Adam Manes, MD     History of Present Illness:    Adam Arellano is a 85 y.o. male here for follow-up of coronary artery disease.  Status post stent drug-eluting to distal RCA in 2007 as well as DES x2 to mid RCA for in-stent restenosis and proximal RCA in 2011 with normal nuclear stress test in December 2014 here for follow-up of coronary disease, hypertension, hyperlipidemia.  Prior PVCs noted.  Prior angina was somewhat atypical more so back discomfort.  Negative troponins and prior ER visit in 2014.  Nuclear stress test was normal.  Daughter is a Marine scientist.  Overall has been doing excellent.  No chest pain fevers chills nausea vomiting syncope bleeding.  He is on Plavix monotherapy, excellent.  Reduce his bleeding risk.  I want him to stay on the Plavix.  Does not wish to be on a statin at this point.  Feels well.  Past Medical History:  Diagnosis Date  . Asthma    a. Childhood.  . Coronary artery disease    a. NSTEMI s/p DES to dRCA 2007. b. Angina 07/2009: s/p DESx2 to mid RCA for ISR and prox RCA, EF 65%. c. Stress test 05/2013: low risk, no evidence of ischemia, EF 61%.  . Dyslipidemia   . GERD (gastroesophageal reflux disease)   . Hypertension   . Prostate cancer [facility])    a. s/p radical prostatectomy.  [name] PVC's (premature ventricular contractions)   . Transient global amnesia    a. Adm 2009: imaging nonacute, had resolved.    Past Surgical History:  Procedure Laterality Date  . MASTOIDECTOMY     L ear  . PROSTATECTOMY      Current Medications: Current Meds  Medication Sig  . clopidogrel (PLAVIX) 75 MG tablet TAKE 1 TABLET DAILY  . hydrochlorothiazide (HYDRODIURIL) 25 MG tablet Take 25 mg by mouth daily.  [name] losartan (COZAAR) 25 MG tablet Take 1 tablet (25  mg total) by mouth daily. Pt needs to make appt for further refills  . metoprolol tartrate (LOPRESSOR) 25 MG tablet Take 25 mg by mouth daily.   . Multiple Vitamin (MULTIVITAMIN WITH MINERALS) TABS tablet Take 1 tablet by mouth daily.  . Multiple Vitamins-Minerals (OCUVITE ADULT FORMULA PO) Take 1 tablet by mouth daily.  . nitroGLYCERIN (NITROSTAT) 0.4 MG SL tablet PLACE 1 TABLET UNDER THE TONGUE AT ONSET OF CHEST PAIN EVERY 5 MINTUES UP TO 3 TIMES AS NEEDED     Allergies:   Lisinopril and Simvastatin   Social History   Socioeconomic History  . Marital status: Married    Spouse name: Not on file  . Number of children: Not on file  . Years of education: Not on file  . Highest education level: Not on file  Occupational History  . Not on file  Social Needs  . Financial resource strain: Not on file  . Food insecurity    Worry: Not on file    Inability: Not on file  . Transportation needs    Medical: Not on file    Non-medical: Not on file  Tobacco Use  . Smoking status: Former Research scientist (life sciences)  . Smokeless tobacco: Never Used  . Tobacco comment: Smoked for 15 years  Substance and Sexual Activity  . Alcohol use:  Yes    Comment: rare  . Drug use: No  . Sexual activity: Not on file  Lifestyle  . Physical activity    Days per week: Not on file    Minutes per session: Not on file  . Stress: Not on file  Relationships  . Social Herbalist on phone: Not on file    Gets together: Not on file    Attends religious service: Not on file    Active member of club or organization: Not on file    Attends meetings of clubs or organizations: Not on file    Relationship status: Not on file  Other Topics Concern  . Not on file  Social History Narrative  . Not on file     Family History: The patient's family history is negative for CAD.  ROS:   Please see the history of present illness.     All other systems reviewed and are negative.  EKGs/Labs/Other Studies Reviewed:    The  following studies were reviewed today: Prior EKG, office note, catheterization, lab work  EKG:  EKG is  ordered today.  03/21/2019-sinus rhythm 61 with 2 PVCs noted on ECG.  Similar morphologies.  Personally reviewed 03/21/2018-sinus rhythm 60 with occasional PVC no other abnormalities.  Personally reviewed and interpreted, no change when compared to prior.  Recent Labs: No results found for requested labs within last 8760 hours.  Recent Lipid Panel    Component Value Date/Time   CHOL  08/27/2007 0415    118        ATP III CLASSIFICATION:  <200     mg/dL   Desirable  200-239  mg/dL   Borderline High  >=240    mg/dL   High   TRIG 53 08/27/2007 0415   HDL 34 (L) 08/27/2007 0415   CHOLHDL 3.5 08/27/2007 0415   VLDL 11 08/27/2007 0415   LDLCALC  08/27/2007 0415    73        Total Cholesterol/HDL:CHD Risk Coronary Heart Disease Risk Table                     Men   Women  1/2 Average Risk   3.4   3.3    Physical Exam:    VS:  BP 134/74   Pulse 61   Ht 5\' 8"  (1.727 m)   Wt 163 lb 9.6 oz (74.2 kg)   SpO2 98%   BMI 24.88 kg/m     Wt Readings from Last 3 Encounters:  03/21/19 163 lb 9.6 oz (74.2 kg)  03/21/18 169 lb (76.7 kg)  07/02/17 177 lb 3.2 oz (80.4 kg)     GEN: Well nourished, well developed, in no acute distress elderly HEENT: normal  Neck: no JVD, carotid bruits, or masses Cardiac: RRR; no murmurs, rubs, or gallops,no edema rare ectopy Respiratory:  clear to auscultation bilaterally, normal work of breathing GI: soft, nontender, nondistended, + BS MS: no deformity or atrophy  Skin: warm and dry, no rash Neuro:  Alert and Oriented x 3, Strength and sensation are intact Psych: euthymic mood, full affect   ASSESSMENT:    1. Essential hypertension   2. CAD S/P percutaneous coronary angioplasty   3. Old MI (myocardial infarction)   4. Atherosclerosis of native coronary artery of native heart without angina pectoris   5. Pure hypercholesterolemia    PLAN:     In order of problems listed above:   CAD status post PCI  RCA -Continue with secondary prevention.  Plavix monotherapy long-term due to early generation DES.  Trying to prevent late stent thrombosis.  Atypical anginal symptoms previously.  Prior stress test reassuring.  I am fine with Plavix monotherapy.  No changes made.  Has been steady for many years.  Essential hypertension -Currently well controlled.  Medications reviewed.  Mildly elevated at times.  No changes.  Doing well.  Low-dose metoprolol as well as losartan.  And hydrochlorothiazide.  Creatinine 1.5.  Monitored by Dr. Felipa Eth.  Hyperlipidemia mixed -LDL 82.  Stopped simvastatin previously because of muscle aches.  Does not wish to resume any statin therapy.  Explained to him that with underlying coronary artery disease, this may be helpful or protective for him in reduction of stroke and heart attack risk.  Thankfully, cholesterol levels are reasonable.  No changes made.  Okay for as needed follow-up.  Dr. Felipa Eth or himself let me know if there are any new cardiac issues.  I would be fine with Dr. Felipa Eth prescribing clopidogrel.   Medication Adjustments/Labs and Tests Ordered: Current medicines are reviewed at length with the patient today.  Concerns regarding medicines are outlined above.  Orders Placed This Encounter  Procedures  . EKG 12-Lead   No orders of the defined types were placed in this encounter.   Patient Instructions  Medication Instructions:  The current medical regimen is effective;  continue present plan and medications.  Follow-Up: At [facility], you and your health needs are our priority.  As part of our continuing mission to provide you with exceptional heart care, we have created designated Provider Care Teams.  These Care Teams include your primary Cardiologist (physician) and Advanced Practice Providers (APPs -  Physician Assistants and Nurse Practitioners) who all work together to provide  you with the care you need, when you need it.  Your next appointment:   Follow up with Dr Marlou Porch as needed.  Thank you for choosing [facility]!!        Signed, Candee Furbish, MD  03/21/2019 8:18 AM    [place]

## 2019-04-10 ENCOUNTER — Other Ambulatory Visit: Payer: Self-pay | Admitting: Cardiology

## 2019-07-07 ENCOUNTER — Other Ambulatory Visit: Payer: Self-pay | Admitting: Cardiology

## 2019-07-21 ENCOUNTER — Other Ambulatory Visit: Payer: Self-pay | Admitting: Cardiology

## 2020-01-06 ENCOUNTER — Other Ambulatory Visit: Payer: Self-pay | Admitting: Cardiology

## 2020-01-12 DIAGNOSIS — H9212 Otorrhea, left ear: Secondary | ICD-10-CM | POA: Insufficient documentation

## 2020-01-12 DIAGNOSIS — H7292 Unspecified perforation of tympanic membrane, left ear: Secondary | ICD-10-CM | POA: Insufficient documentation

## 2020-04-12 ENCOUNTER — Other Ambulatory Visit: Payer: Self-pay | Admitting: Cardiology

## 2020-04-22 ENCOUNTER — Other Ambulatory Visit: Payer: Self-pay | Admitting: Cardiology

## 2020-04-24 DIAGNOSIS — Z8546 Personal history of malignant neoplasm of prostate: Secondary | ICD-10-CM | POA: Insufficient documentation

## 2020-04-24 DIAGNOSIS — I129 Hypertensive chronic kidney disease with stage 1 through stage 4 chronic kidney disease, or unspecified chronic kidney disease: Secondary | ICD-10-CM | POA: Insufficient documentation

## 2020-04-24 DIAGNOSIS — N1832 Chronic kidney disease, stage 3b: Secondary | ICD-10-CM | POA: Insufficient documentation

## 2020-04-24 DIAGNOSIS — N189 Chronic kidney disease, unspecified: Secondary | ICD-10-CM | POA: Insufficient documentation

## 2020-06-09 ENCOUNTER — Telehealth: Payer: Self-pay

## 2020-06-09 NOTE — Telephone Encounter (Signed)
NOTES ON FILE FROM [facility] [phone], SENT REFERRAL TO SCHEDULING 

## 2020-06-15 ENCOUNTER — Encounter: Payer: Self-pay | Admitting: Cardiology

## 2020-06-15 ENCOUNTER — Other Ambulatory Visit: Payer: Self-pay

## 2020-06-15 ENCOUNTER — Ambulatory Visit: Payer: Medicare Other | Admitting: Cardiology

## 2020-06-15 VITALS — BP 120/80 | HR 86 | Ht 68.0 in | Wt 153.0 lb

## 2020-06-15 DIAGNOSIS — I10 Essential (primary) hypertension: Secondary | ICD-10-CM | POA: Diagnosis not present

## 2020-06-15 DIAGNOSIS — R011 Cardiac murmur, unspecified: Secondary | ICD-10-CM | POA: Diagnosis not present

## 2020-06-15 NOTE — Progress Notes (Signed)
Cardiology Office Note:    Date:  06/15/2020   ID:  Adam Arellano, DOB [birth year], MRN [id number]  PCP:  Lajean Manes, MD  [facility] Cardiologist:  Candee Furbish, MD  [facility] Electrophysiologist:  None   Referring MD: Lajean Manes, MD    History of Present Illness:    Adam Arellano is a 87 y.o. male here for the follow-up of coronary artery disease.  Cardiac catheterization 2007- distal RCA stent Cardiac catheterization 2011-proximal RCA stent for in-stent restenosis  Has hypertension hyperlipidemia and prior PVCs noted as well.  Her daughter is a Marine scientist.  Dr. Felipa Eth saw him after lower extremity edema develop.  Placed him on Lasix.  He also had some shortness of breath at that time.  His breathing improved with Lasix administration as well as his lower extremity edema improved.  He still very active.  Past Medical History:  Diagnosis Date  . Asthma    a. Childhood.  . Coronary artery disease    a. NSTEMI s/p DES to dRCA 2007. b. Angina 07/2009: s/p DESx2 to mid RCA for ISR and prox RCA, EF 65%. c. Stress test 05/2013: low risk, no evidence of ischemia, EF 61%.  . Dyslipidemia   . GERD (gastroesophageal reflux disease)   . Hypertension   . Prostate cancer [facility])    a. s/p radical prostatectomy.  [name] PVC's (premature ventricular contractions)   . Transient global amnesia    a. Adm 2009: imaging nonacute, had resolved.    Past Surgical History:  Procedure Laterality Date  . MASTOIDECTOMY     L ear  . PROSTATECTOMY      Current Medications: Current Meds  Medication Sig  . clopidogrel (PLAVIX) 75 MG tablet Take 1 tablet (75 mg total) by mouth daily. Please make overdue appt with Dr. Marlou Porch before anymore refills. Thank you 1st attempt  . furosemide (LASIX) 20 MG tablet Take 20 mg by mouth daily.  . hydrochlorothiazide (HYDRODIURIL) 25 MG tablet Take 25 mg by mouth daily.  [name] losartan (COZAAR) 25 MG tablet TAKE 1 TABLET DAILY  . metoprolol tartrate  (LOPRESSOR) 25 MG tablet Take 25 mg by mouth daily.   . Multiple Vitamin (MULTIVITAMIN WITH MINERALS) TABS tablet Take 1 tablet by mouth daily.  . Multiple Vitamins-Minerals (OCUVITE ADULT FORMULA PO) Take 1 tablet by mouth daily.  . nitroGLYCERIN (NITROSTAT) 0.4 MG SL tablet PLACE 1 TABLET UNDER THE TONGUE AT ONSET OF CHEST PAIN EVERY 5 MINTUES UP TO 3 TIMES AS NEEDED     Allergies:   Lisinopril and Simvastatin   Social History   Socioeconomic History  . Marital status: Married    Spouse name: Not on file  . Number of children: Not on file  . Years of education: Not on file  . Highest education level: Not on file  Occupational History  . Not on file  Tobacco Use  . Smoking status: Former Research scientist (life sciences)  . Smokeless tobacco: Never Used  . Tobacco comment: Smoked for 15 years  Vaping Use  . Vaping Use: Never used  Substance and Sexual Activity  . Alcohol use: Yes    Comment: rare  . Drug use: No  . Sexual activity: Not on file  Other Topics Concern  . Not on file  Social History Narrative  . Not on file   Social Determinants of Health   Financial Resource Strain: Not on file  Food Insecurity: Not on file  Transportation Needs: Not on file  Physical Activity: Not  on file  Stress: Not on file  Social Connections: Not on file     Family History: The patient's family history is negative for CAD.  ROS:   Please see the history of present illness.     All other systems reviewed and are negative.  EKGs/Labs/Other Studies Reviewed:    The following studies were reviewed today:  Lower extremity Doppler 05/13/2020-negative for DVT  EKG:  EKG is  ordered today.  The ekg ordered today demonstrates sinus rhythm 67, PVC.  Recent Labs: No results found for requested labs within last 8760 hours.  Recent Lipid Panel    Component Value Date/Time   CHOL  08/27/2007 0415    118        ATP III CLASSIFICATION:  <200     mg/dL   Desirable  200-239  mg/dL   Borderline High  >=240     mg/dL   High   TRIG 53 08/27/2007 0415   HDL 34 (L) 08/27/2007 0415   CHOLHDL 3.5 08/27/2007 0415   VLDL 11 08/27/2007 0415   LDLCALC  08/27/2007 0415    73        Total Cholesterol/HDL:CHD Risk Coronary Heart Disease Risk Table                     Men   Women  1/2 Average Risk   3.4   3.3     Physical Exam:    VS:  BP 120/80 (BP Location: Left Arm, Patient Position: Sitting, Cuff Size: Normal)   Pulse 86   Ht 5\' 8"  (1.727 m)   Wt 153 lb (69.4 kg)   SpO2 96%   BMI 23.26 kg/m     Wt Readings from Last 3 Encounters:  06/15/20 153 lb (69.4 kg)  03/21/19 163 lb 9.6 oz (74.2 kg)  03/21/18 169 lb (76.7 kg)     GEN:  Well nourished, well developed in no acute distress HEENT: Normal NECK: No JVD; No carotid bruits LYMPHATICS: No lymphadenopathy CARDIAC: RRR, 4/6 systolic murmur, no rubs, gallops RESPIRATORY:  Clear to auscultation without rales, wheezing or rhonchi  ABDOMEN: Soft, non-tender, non-distended MUSCULOSKELETAL: 2+ lower extremity bilateral edema; No deformity  SKIN: Warm and dry NEUROLOGIC:  Alert and oriented x 3 PSYCHIATRIC:  Normal affect   ASSESSMENT:    1. Essential hypertension   2. Murmur, cardiac    PLAN:    In order of problems listed above:  Heart murmur - I will check an echocardiogram here in our office to be able to view images personally and to better quantify valvular lesion.  This is a new murmur.  Likely significant mitral regurgitation.  Potentially may be candidate for MitraClip.  CAD status post PCI RCA -Continue with secondary prevention.  Plavix monotherapy long-term due to early generation DES.  Trying to prevent late stent thrombosis.  Atypical anginal symptoms previously.  Prior stress test reassuring, 2014.    Still very active.  No chest pressure.  Essential hypertension -Currently well controlled.  Medications reviewed.  Mildly elevated at times.  No changes.  Doing well.  Low-dose metoprolol as well as losartan.  And  hydrochlorothiazide.  Creatinine 1.7.  Monitored by Dr. Felipa Eth.  Hyperlipidemia mixed -LDL 82.  Stopped simvastatin previously because of muscle aches.  Does not wish to resume any statin therapy.  Explained to him that with underlying coronary artery disease, this may be helpful or protective for him in reduction of stroke and heart attack risk.  Thankfully, cholesterol levels are reasonable.  No changes made.          Medication Adjustments/Labs and Tests Ordered: Current medicines are reviewed at length with the patient today.  Concerns regarding medicines are outlined above.  Orders Placed This Encounter  Procedures  . EKG 12-Lead  . ECHOCARDIOGRAM COMPLETE   No orders of the defined types were placed in this encounter.   Patient Instructions  Medication Instructions:  The current medical regimen is effective;  continue present plan and medications.  *If you need a refill on your cardiac medications before your next appointment, please call your pharmacy*  Testing/Procedures: Your physician has requested that you have an echocardiogram. Echocardiography is a painless test that uses sound waves to create images of your heart. It provides your doctor with information about the size and shape of your heart and how well your heart's chambers and valves are working. This procedure takes approximately one hour. There are no restrictions for this procedure.  Follow-Up: At [facility], you and your health needs are our priority.  As part of our continuing mission to provide you with exceptional heart care, we have created designated Provider Care Teams.  These Care Teams include your primary Cardiologist (physician) and Advanced Practice Providers (APPs -  Physician Assistants and Nurse Practitioners) who all work together to provide you with the care you need, when you need it.  We recommend signing up for the patient portal called "MyChart".  Sign up information is provided on  this After Visit Summary.  MyChart is used to connect with patients for Virtual Visits (Telemedicine).  Patients are able to view lab/test results, encounter notes, upcoming appointments, etc.  Non-urgent messages can be sent to your provider as well.   To learn more about what you can do with MyChart, go to [email or web address].    Your next appointment:   6 month(s)  The format for your next appointment:   In Person  Provider:   Candee Furbish, MD   Thank you for choosing [facility]!!         Signed, Candee Furbish, MD  06/15/2020 12:15 PM    [place]

## 2020-06-15 NOTE — Patient Instructions (Signed)
Medication Instructions:  °The current medical regimen is effective;  continue present plan and medications. ° °*If you need a refill on your cardiac medications before your next appointment, please call your pharmacy* ° °Testing/Procedures: °Your physician has requested that you have an echocardiogram. Echocardiography is a painless test that uses sound waves to create images of your heart. It provides your doctor with information about the size and shape of your heart and how well your heart’s chambers and valves are working. This procedure takes approximately one hour. There are no restrictions for this procedure. ° °Follow-Up: °At [facility], you and your health needs are our priority.  As part of our continuing mission to provide you with exceptional heart care, we have created designated Provider Care Teams.  These Care Teams include your primary Cardiologist (physician) and Advanced Practice Providers (APPs -  Physician Assistants and Nurse Practitioners) who all work together to provide you with the care you need, when you need it. ° °We recommend signing up for the patient portal called "MyChart".  Sign up information is provided on this After Visit Summary.  MyChart is used to connect with patients for Virtual Visits (Telemedicine).  Patients are able to view lab/test results, encounter notes, upcoming appointments, etc.  Non-urgent messages can be sent to your provider as well.   °To learn more about what you can do with MyChart, go to [email or web address].   ° °Your next appointment:   °6 month(s) ° °The format for your next appointment:   °In Person ° °Provider:   °Mark Skains, MD { ° ° ° °Thank you for choosing [facility]!! ° ° ° °

## 2020-06-18 ENCOUNTER — Other Ambulatory Visit: Payer: Self-pay

## 2020-06-18 ENCOUNTER — Ambulatory Visit (HOSPITAL_COMMUNITY): Payer: Medicare Other | Attending: Cardiovascular Disease

## 2020-06-18 DIAGNOSIS — I10 Essential (primary) hypertension: Secondary | ICD-10-CM | POA: Diagnosis not present

## 2020-06-18 DIAGNOSIS — R011 Cardiac murmur, unspecified: Secondary | ICD-10-CM | POA: Diagnosis not present

## 2020-06-18 LAB — ECHOCARDIOGRAM COMPLETE
AR max vel: 1.55 cm2
AV Area VTI: 2.04 cm2
AV Area mean vel: 1.82 cm2
AV Mean grad: 5 mmHg
AV Peak grad: 13.7 mmHg
Ao pk vel: 1.85 m/s
Area-P 1/2: 3.1 cm2
MV M vel: 4.94 m/s
MV Peak grad: 97.7 mmHg
MV VTI: 1.28 cm2
S' Lateral: 3.8 cm

## 2020-06-21 ENCOUNTER — Telehealth: Payer: Self-pay | Admitting: *Deleted

## 2020-06-21 DIAGNOSIS — R011 Cardiac murmur, unspecified: Secondary | ICD-10-CM

## 2020-06-21 DIAGNOSIS — I340 Nonrheumatic mitral (valve) insufficiency: Secondary | ICD-10-CM

## 2020-06-21 NOTE — Telephone Encounter (Signed)
-----   Message from Jerline Pain, MD sent at 06/21/2020  8:30 AM EST ----- Severe mitral regurgitation  Normal pump function  Please set up a consult with structural heart team for potential MitraClip.  Candee Furbish, MD

## 2020-06-21 NOTE — Telephone Encounter (Signed)
Patient notified of results.  He would like to proceed with referral. He is aware he will be called with appointment information

## 2020-06-22 NOTE — Telephone Encounter (Signed)
Pt scheduled for MitraClip consult with Dr Burt Knack on 06/24/20.

## 2020-06-24 ENCOUNTER — Other Ambulatory Visit: Payer: Self-pay

## 2020-06-24 ENCOUNTER — Encounter: Payer: Self-pay | Admitting: Cardiovascular Disease

## 2020-06-24 ENCOUNTER — Ambulatory Visit: Payer: Medicare Other | Admitting: Cardiovascular Disease

## 2020-06-24 VITALS — BP 120/64 | HR 67 | Ht 68.0 in | Wt 160.4 lb

## 2020-06-24 DIAGNOSIS — I340 Nonrheumatic mitral (valve) insufficiency: Secondary | ICD-10-CM | POA: Diagnosis not present

## 2020-06-24 NOTE — Patient Instructions (Addendum)
Please call us if you decide you'd like to proceed! Valetta Fuller, RN: [phone] Ander Purpura, RN (Structural heart Nurse Navigator): [phone]

## 2020-06-24 NOTE — Progress Notes (Signed)
[facility]   MULTIDISCIPLINARY HEART VALVE TEAM  Date:  06/24/2020   ID:  [name], DOB [birth year], MRN [id number]  PCP:  Lajean Manes, MD   Chief Complaint  Patient presents with  . Shortness of Breath    HISTORY OF PRESENT ILLNESS: FREEMON REATH is a 87 y.o. male who presents for evaluation of severe mitral regurgitation, referred by Dr Marlou Porch.   The patient is here with his daughter today. He is a widower. 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 He denies any history of rheumatic fever or longstanding heart murmur. In fact, he was only recently found to have a murmur.  Last fall he developed symptoms of shortness of breath and leg swelling.  He was started on furosemide by Dr. Felipa Eth and his symptoms improved.  He was noted to have a loud heart murmur which had not been appreciated in the past.  An echocardiogram demonstrated severe eccentric mitral regurgitation.  He was just seen recently by Dr. Marlou Porch and is now referred to review treatment options for severe mitral regurgitation.  The patient reports that his symptoms have improved significantly with the use of furosemide.  He still has mild shortness of breath which is more noticeable at times.  He remains functionally independent and relatively active.  He is not limited in his normal daily activities.  He continues to have some leg swelling.  He denies orthopnea, PND, lightheadedness, heart palpitations, or frank syncope.  The patient has had regular dental care and reports full dentures.  Past Medical History:  Diagnosis Date  . Asthma    a. Childhood.  . Coronary artery disease    a. NSTEMI s/p DES to dRCA 2007. b. Angina 07/2009: s/p DESx2 to mid RCA for ISR and prox RCA, EF 65%. c. Stress test 05/2013: low risk, no evidence of ischemia, EF 61%.  . Dyslipidemia   . GERD (gastroesophageal reflux disease)   . Hypertension   . Prostate cancer [facility])    a. s/p radical  prostatectomy.  [name] PVC's (premature ventricular contractions)   . Transient global amnesia    a. Adm 2009: imaging nonacute, had resolved.    Current Outpatient Medications  Medication Sig Dispense Refill  . clopidogrel (PLAVIX) 75 MG tablet Take 1 tablet (75 mg total) by mouth daily. Please make overdue appt with Dr. Marlou Porch before anymore refills. Thank you 1st attempt 30 tablet 0  . furosemide (LASIX) 20 MG tablet Take 20 mg by mouth daily.    . hydrochlorothiazide (HYDRODIURIL) 25 MG tablet Take 25 mg by mouth daily.    [name] losartan (COZAAR) 25 MG tablet TAKE 1 TABLET DAILY 90 tablet 0  . metoprolol tartrate (LOPRESSOR) 25 MG tablet Take 25 mg by mouth daily.     . Multiple Vitamin (MULTIVITAMIN WITH MINERALS) TABS tablet Take 1 tablet by mouth daily.    . nitroGLYCERIN (NITROSTAT) 0.4 MG SL tablet PLACE 1 TABLET UNDER THE TONGUE AT ONSET OF CHEST PAIN EVERY 5 MINTUES UP TO 3 TIMES AS NEEDED 25 tablet 3   No current facility-administered medications for this visit.    ALLERGIES:   Lisinopril and Simvastatin   SOCIAL HISTORY:  The patient  reports that he has quit smoking. He has never used smokeless tobacco. He reports current alcohol use. He reports that he does not use drugs.   FAMILY HISTORY:  The patient's family history is not on file.  There is no  family history of valvular heart disease.  REVIEW OF SYSTEMS: Negative except as reviewed in the HPI  PHYSICAL EXAM: VS:  BP 120/64   Pulse 67   Ht 5\' 8"  (1.727 m)   Wt 160 lb 6.4 oz (72.8 kg)   SpO2 97%   BMI 24.39 kg/m  , BMI Body mass index is 24.39 kg/m. GEN: Well nourished, well developed, in no acute distress HEENT: normal Neck: No JVD. carotids 2+ without bruits or masses Cardiac: The heart is RRR with a grade 3/6 holosystolic murmur heard at the apex and left lower sternal border.  1+ bilateral pretibial edema.  Respiratory:  clear to auscultation bilaterally GI: soft, nontender, nondistended, + BS MS: no deformity or  atrophy Skin: warm and dry, no rash Neuro:  Strength and sensation are intact Psych: euthymic mood, full affect  RECENT LABS: No results found for requested labs within last 8760 hours.  No results found for requested labs within last 8760 hours.   CrCl cannot be calculated (Patient's most recent lab result is older than the maximum 21 days allowed.).   Wt Readings from Last 3 Encounters:  06/24/20 160 lb 6.4 oz (72.8 kg)  06/15/20 153 lb (69.4 kg)  03/21/19 163 lb 9.6 oz (74.2 kg)     CARDIAC STUDIES:  Echo:  1. Left ventricular ejection fraction, by estimation, is 55 to 60%. The  left ventricle has normal function. The left ventricle has no regional  wall motion abnormalities. Left ventricular diastolic parameters are  consistent with Grade II diastolic  dysfunction (pseudonormalization). Elevated left ventricular end-diastolic  pressure.  2. Right ventricular systolic function is normal. The right ventricular  size is normal. There is mildly elevated pulmonary artery systolic  pressure.  3. Left atrial size was severely dilated.  4. Likely severe mitral regurgitation directed anteriorly. The mitral  valve is normal in structure. Severe mitral valve regurgitation. No  evidence of mitral stenosis.  5. The aortic valve is calcified. There is mild calcification of the  aortic valve. There is mild thickening of the aortic valve. Aortic valve  regurgitation is not visualized. No aortic stenosis is present.  6. Aortic dilatation noted. There is mild dilatation of the descending  aorta, measuring 37 mm.  7. The inferior vena cava is normal in size with greater than 50%  respiratory variability, suggesting right atrial pressure of 3 mmHg.  ASSESSMENT AND PLAN: 52.  87 year old gentleman with newly diagnosed severe mitral regurgitation, stage D1.  This is associated with New York Heart Association functional class II symptoms of chronic diastolic heart failure.  The patient  has had modest clinical improvement with use of furosemide.  I have reviewed his echocardiogram which demonstrates an LVEF of 55 to 60%, probably indicative of mild LV dysfunction in the setting of severe mitral regurgitation.  There is severe anteriorly directed eccentric mitral regurgitation with a probable flail segment of the posterior leaflet of the mitral valve.  The left atrium is severely dilated.  I have reviewed the natural history of mitral regurgitation with the patient and their family members who are present today. We have discussed the limitations of medical therapy and the poor prognosis associated with symptomatic mitral regurgitation. We have also reviewed potential treatment options, including palliative medical therapy, conventional surgical mitral valve repair or replacement, and percutaneous mitral valve therapies such as edge-to-edge mitral valve approximation with MitraClip. We discussed treatment options in the context of this patient's specific comorbid medical conditions.   The patient is  counseled specifically about the risks, indications, and alternatives to percutaneous mitral valve repair with MitraClip. Specific risks include vascular injury, bleeding, infection, arrhythmia, myocardial infarction, stroke, cardiac perforation, cardiac tamponade, device embolization, single leaflet detachment, endocarditis, mitral valve injury, emergency surgery, and death. They understand the risk of serious complication occurs at a rate of approximately 1-2%.  We reviewed the work-up for transcatheter edge-to-edge mitral valve repair.  He understands that he would undergo a transesophageal echo study to better define the functional anatomy of the mitral valve and confirm severe mitral regurgitation.  This study would also be the key factor in determining whether his valve could be treated with transcatheter edge-to-edge repair.  Right and left heart catheterization would assess the hemodynamic  significance of his mitral regurgitation and also evaluate for any progressive coronary artery disease in this patient who has had previous coronary stenting.  After our discussion today, the patient prefers to think about whether he would like to proceed with this testing with an eye towards MitraClip.  He is not sure that he wants to undergo further evaluation and treatment at this time.  All of his questions were answered today.  I would be happy to see him anytime in the future if he decides to move forward.  He will contact us if he decides to undergo any testing or further treatment.  Otherwise he will keep his regularly scheduled follow-up with Dr. Marlou Porch and I will add an echocardiogram prior to his next visit with Dr. Marlou Porch just in case we do not hear from the patient before that time.  He understands to seek immediate medical attention if his symptoms progress.  Deatra James 06/24/2020 1:38 PM     [place] [address] [place] [address]  [phone] (office) [phone] (fax)

## 2020-07-10 ENCOUNTER — Other Ambulatory Visit: Payer: Self-pay | Admitting: Cardiology

## 2020-07-18 DIAGNOSIS — I10 Essential (primary) hypertension: Secondary | ICD-10-CM | POA: Diagnosis not present

## 2020-07-18 DIAGNOSIS — R0602 Shortness of breath: Secondary | ICD-10-CM | POA: Diagnosis not present

## 2020-07-18 DIAGNOSIS — R069 Unspecified abnormalities of breathing: Secondary | ICD-10-CM | POA: Diagnosis not present

## 2020-07-18 DIAGNOSIS — R0600 Dyspnea, unspecified: Secondary | ICD-10-CM | POA: Diagnosis not present

## 2020-07-18 DIAGNOSIS — R0689 Other abnormalities of breathing: Secondary | ICD-10-CM | POA: Diagnosis not present

## 2020-07-19 ENCOUNTER — Telehealth: Payer: Self-pay | Admitting: Cardiovascular Disease

## 2020-07-19 NOTE — Telephone Encounter (Signed)
Spoke to the patient's daughter, who now states he wishes to proceed with testing for Clip. He had an episode of SOB recently where EMS was called and he realized his symptoms are worse than he thought.  Scheduled the patient for visit with [name] 2/23. He will have TEE ([facility]) and L/[facility] Knack) on 08/06/20. Covid test 08/04/20. They will receive instructions at visit on 2/23. She was grateful for call and agrees with plan.

## 2020-07-19 NOTE — Telephone Encounter (Signed)
Per daughter, her father has decided to go ahead and place a clip on his mitral valve. Please advise.

## 2020-07-26 ENCOUNTER — Other Ambulatory Visit: Payer: Self-pay | Admitting: Cardiology

## 2020-07-27 NOTE — H&P (View-Only) (Signed)
[facility] [name]                                     Cardiology Office Note:    Date:  07/28/2020   ID:  Adam Arellano, DOB 13-Sep-1931, MRN [id number]  PCP:  Lajean Manes, MD  [facility] Cardiologist:  Candee Furbish, MD  [facility] Electrophysiologist:  None   Referring MD: Lajean Manes, MD   Set up TEE and L/RHC  History of Present Illness:    Adam Arellano is a 87 y.o. male with a hx of CAD with NSTEMI s/p DES to dRCA 2007, s/p DESx2 to mid RCA for ISR and prox RCA in 2011, HTN, HLD, PVCs, prostate CA s/p radical prostatectomy and severe MR who presents to clinic for follow up.   Pt has a history of remote RCA stenting in 2007 in the setting of NSTEMI and repeat PCI for ISR in 2011. He has been followed by Dr. Marlou Porch. He recently developed LE edema and was placed on lasix by his PCP, Dr. Felipa Eth. A new, loud murmur was also noted prompting echocardiography. This showed EF 55-60% with severe anteriorly directed MR and severe LAE and probable flail segment of posterior leaflet. He was seen by Dr. Burt Knack in consultation for potential TEER for treatment of his severe MR. Plans were made for MitraClip work up but pt wished to think about it further. The patient called back later to report being ready to start work up and was added to my schedule for discussion.   Today he presents to clinic for follow up. Here with daughter. A couple weeks ago he had an episode of shortness of breath that scared him. The paramedics were called and he reportedly checked out okay. He has done well ever since but has dyspnea on exertion. No LE edema since started on Lasix. No orthopnea or PND. No chest pain.     Past Medical History:  Diagnosis Date  . Asthma    a. Childhood.  . Coronary artery disease    a. NSTEMI s/p DES to dRCA 2007. b. Angina 07/2009: s/p DESx2 to mid RCA for ISR and prox RCA, EF 65%. c. Stress test 05/2013: low risk, no  evidence of ischemia, EF 61%.  . Dyslipidemia   . GERD (gastroesophageal reflux disease)   . Hypertension   . Prostate cancer [facility])    a. s/p radical prostatectomy.  [name] PVC's (premature ventricular contractions)   . Transient global amnesia    a. Adm 2009: imaging nonacute, had resolved.    Past Surgical History:  Procedure Laterality Date  . MASTOIDECTOMY     L ear  . PROSTATECTOMY      Current Medications: Current Meds  Medication Sig  . ciprofloxacin-dexamethasone (CIPRODEX) OTIC suspension as needed.  . clopidogrel (PLAVIX) 75 MG tablet TAKE 1 TABLET DAILY  . furosemide (LASIX) 20 MG tablet Take 20 mg by mouth daily.  . hydrochlorothiazide (HYDRODIURIL) 25 MG tablet Take 25 mg by mouth daily.  [name] losartan (COZAAR) 25 MG tablet TAKE 1 TABLET DAILY  . metoprolol tartrate (LOPRESSOR) 25 MG tablet Take 25 mg by mouth daily.   . Multiple Vitamin (MULTIVITAMIN WITH MINERALS) TABS tablet Take 1 tablet by mouth daily.  . nitroGLYCERIN (NITROSTAT) 0.4 MG SL tablet PLACE 1 TABLET UNDER THE TONGUE AT ONSET OF CHEST PAIN EVERY 5 MINTUES  UP TO 3 TIMES AS NEEDED     Allergies:   Lisinopril and Simvastatin   Social History   Socioeconomic History  . Marital status: Married    Spouse name: Not on file  . Number of children: Not on file  . Years of education: Not on file  . Highest education level: Not on file  Occupational History  . Not on file  Tobacco Use  . Smoking status: Former Research scientist (life sciences)  . Smokeless tobacco: Never Used  . Tobacco comment: Smoked for 15 years  Vaping Use  . Vaping Use: Never used  Substance and Sexual Activity  . Alcohol use: Yes    Comment: rare  . Drug use: No  . Sexual activity: Not on file  Other Topics Concern  . Not on file  Social History Narrative  . Not on file   Social Determinants of Health   Financial Resource Strain: Not on file  Food Insecurity: Not on file  Transportation Needs: Not on file  Physical Activity: Not on file  Stress:  Not on file  Social Connections: Not on file     Family History: The patient's family history is negative for CAD.  ROS:   Please see the history of present illness.    All other systems reviewed and are negative.  EKGs/Labs/Other Studies Reviewed:    The following studies were reviewed today:  Echo 06/18/20 IMPRESSIONS 1. Left ventricular ejection fraction, by estimation, is 55 to 60%. The  left ventricle has normal function. The left ventricle has no regional  wall motion abnormalities. Left ventricular diastolic parameters are  consistent with Grade II diastolic  dysfunction (pseudonormalization). Elevated left ventricular end-diastolic  pressure.  2. Right ventricular systolic function is normal. The right ventricular  size is normal. There is mildly elevated pulmonary artery systolic  pressure.  3. Left atrial size was severely dilated.  4. Likely severe mitral regurgitation directed anteriorly. The mitral  valve is normal in structure. Severe mitral valve regurgitation. No  evidence of mitral stenosis.  5. The aortic valve is calcified. There is mild calcification of the  aortic valve. There is mild thickening of the aortic valve. Aortic valve  regurgitation is not visualized. No aortic stenosis is present.  6. Aortic dilatation noted. There is mild dilatation of the descending  aorta, measuring 37 mm.  7. The inferior vena cava is normal in size with greater than 50%  respiratory variability, suggesting right atrial pressure of 3 mmHg.   FINDINGS  Left Ventricle: Left ventricular ejection fraction, by estimation, is 55  to 60%. The left ventricle has normal function. The left ventricle has no  regional wall motion abnormalities. The left ventricular internal cavity  size was normal in size. There is  no left ventricular hypertrophy. Left ventricular diastolic parameters  are consistent with Grade II diastolic dysfunction (pseudonormalization).  Elevated  left ventricular end-diastolic pressure.   Right Ventricle: The right ventricular size is normal. No increase in  right ventricular wall thickness. Right ventricular systolic function is  normal. There is mildly elevated pulmonary artery systolic pressure. The  tricuspid regurgitant velocity is 3.24  m/s, and with an assumed right atrial pressure of 3 mmHg, the estimated  right ventricular systolic pressure is 96.2 mmHg.   Left Atrium: Left atrial size was severely dilated.   Right Atrium: Right atrial size was normal in size.   Pericardium: There is no evidence of pericardial effusion.   Mitral Valve: Likely severe mitral regurgitation directed anteriorly. The  mitral valve is normal in structure. Severe mitral valve regurgitation,  with anteriorly-directed jet. No evidence of mitral valve stenosis. MV  peak gradient, 12.0 mmHg. The mean  mitral valve gradient is 3.0 mmHg.   Tricuspid Valve: The tricuspid valve is normal in structure. Tricuspid  valve regurgitation is mild . No evidence of tricuspid stenosis.   Aortic Valve: The aortic valve is calcified. There is mild calcification  of the aortic valve. There is mild thickening of the aortic valve. Aortic  valve regurgitation is not visualized. No aortic stenosis is present.  Aortic valve mean gradient measures  5.0 mmHg. Aortic valve peak gradient measures 13.7 mmHg. Aortic valve  area, by VTI measures 2.04 cm.   Pulmonic Valve: The pulmonic valve was normal in structure. Pulmonic valve  regurgitation is trivial. No evidence of pulmonic stenosis.   Aorta: Aortic dilatation noted. There is mild dilatation of the descending  aorta, measuring 37 mm.   Venous: The inferior vena cava is normal in size with greater than 50%  respiratory variability, suggesting right atrial pressure of 3 mmHg.   IAS/Shunts: No atrial level shunt detected by color flow Doppler.     LEFT VENTRICLE  PLAX 2D  LVIDd:     5.50 cm Diastology   LVIDs:     3.80 cm LV e' medial:  6.64 cm/s  LV PW:     1.00 cm LV E/e' medial: 24.5  LV IVS:    0.80 cm LV e' lateral:  8.27 cm/s  LVOT diam:   2.40 cm LV E/e' lateral: 19.6  LV SV:     64  LV SV Index:  35  LVOT Area:   4.52 cm               3D Volume EF:             3D EF:    65 %             LV EDV:    227 ml             LV ESV:    80 ml             LV SV:    146 ml   RIGHT VENTRICLE  RV S prime:   22.20 cm/s  TAPSE (M-mode): 2.6 cm   LEFT ATRIUM       Index    RIGHT ATRIUM      Index  LA diam:    5.30 cm 2.91 cm/m RA Area:   16.40 cm  LA Vol (A2C):  81.4 ml 44.63 ml/m RA Volume:  50.50 ml 27.69 ml/m  LA Vol (A4C):  95.8 ml 52.53 ml/m  LA Biplane Vol: 93.9 ml 51.49 ml/m  AORTIC VALVE  AV Area (Vmax):  1.55 cm  AV Area (Vmean):  1.82 cm  AV Area (VTI):   2.04 cm  AV Vmax:      185.00 cm/s  AV Vmean:     101.000 cm/s  AV VTI:      0.315 m  AV Peak Grad:   13.7 mmHg  AV Mean Grad:   5.0 mmHg  LVOT Vmax:     63.20 cm/s  LVOT Vmean:    40.600 cm/s  LVOT VTI:     0.142 m  LVOT/AV VTI ratio: 0.45    AORTA  Ao Root diam: 3.40 cm  Ao Asc diam: 3.70 cm   MITRAL VALVE        TRICUSPID  VALVE  MV Area (PHT): cm     TR Peak grad:  42.0 mmHg  MV Area VTI:  1.28 cm   TR Vmax:    324.00 cm/s  MV Peak grad: 12.0 mmHg  MV Mean grad: 3.0 mmHg   SHUNTS  MV Vmax:    1.73 m/s   Systemic VTI: 0.14 m  MV Vmean:   77.9 cm/s  Systemic Diam: 2.40 cm  MV Decel Time: 245 msec  MR Peak grad: 97.7 mmHg  MR Mean grad: 62.7 mmHg  MR Vmax:   494.33 cm/s  MR Vmean:   375.7 cm/s  MV E velocity: 162.50 cm/s  MV A velocity: 87.25 cm/s  MV E/A ratio: 1.86    EKG:  EKG is ordered today.  The ekg ordered today demonstrates sinus with PVCs  Recent Labs: No results  found for requested labs within last 8760 hours.  Recent Lipid Panel    Component Value Date/Time   CHOL  08/27/2007 0415    118        ATP III CLASSIFICATION:  <200     mg/dL   Desirable  200-239  mg/dL   Borderline High  >=240    mg/dL   High   TRIG 53 08/27/2007 0415   HDL 34 (L) 08/27/2007 0415   CHOLHDL 3.5 08/27/2007 0415   VLDL 11 08/27/2007 0415   LDLCALC  08/27/2007 0415    73        Total Cholesterol/HDL:CHD Risk Coronary Heart Disease Risk Table                     Men   Women  1/2 Average Risk   3.4   3.3     Risk Assessment/Calculations:       Physical Exam:    VS:  BP 118/68   Pulse 70   Ht 5\' 8"  (1.727 m)   Wt 164 lb (74.4 kg)   SpO2 97%   BMI 24.94 kg/m     Wt Readings from Last 3 Encounters:  07/28/20 164 lb (74.4 kg)  06/24/20 160 lb 6.4 oz (72.8 kg)  06/15/20 153 lb (69.4 kg)     GEN:  Well nourished, well developed in no acute distress HEENT: Normal NECK: No JVD; No carotid bruits LYMPHATICS: No lymphadenopathy CARDIAC: RRR, 3/6 holosystolic murmur heart at apex and LLSB. No rubs, gallops RESPIRATORY:  Clear to auscultation without rales, wheezing or rhonchi  ABDOMEN: Soft, non-tender, non-distended MUSCULOSKELETAL:  1+ bilateral LE edema; No deformity  SKIN: Warm and dry NEUROLOGIC:  Alert and oriented x 3 PSYCHIATRIC:  Normal affect    ______________________   STS score Procedure: Isolated MVR Risk of Mortality: 4.165% Renal Failure: 4.003% Permanent Stroke: 2.754% Prolonged Ventilation: 10.034% DSW Infection: 0.097% Reoperation: 6.184% Morbidity or Mortality: 17.608% Short Length of Stay: 18.207% Long Length of Stay: 10.647%   Procedure: MV Repair Risk of Mortality: 3.245% Renal Failure: 2.212% Permanent Stroke: 3.195% Prolonged Ventilation: 6.353% DSW Infection: 0.051% Reoperation: 4.189% Morbidity or Mortality: 12.063% Short Length of Stay: 31.383% Long Length of Stay: 5.905%   ASSESSMENT:     1. Severe mitral valve regurgitation    PLAN:    In order of problems listed above:  Severe MR: pt has NYHA class II symptoms with an episode of dyspnea a couple weeks ago that scared him. This has prompted him to want to resume discussions about MitraClip. We discussed the next steps which include a TEE and L/RHC, which have  been set up for 08/06/20.  I have reviewed the risks, indications, and alternatives to cardiac catheterization and possible angioplasty/stenting with the patient. Risks include but are not limited to bleeding, infection, vascular injury, stroke, myocardial infection, arrhythmia, kidney injury, radiation-related injury in the case of prolonged fluoroscopy use, emergency cardiac surgery, and death. The patient understands the risks of serious complication is low (<4%).   After careful review of history and examination, the risks and benefits of transesophageal echocardiogram have been explained including risks of esophageal damage, perforation (1:10,000 risk), bleeding, pharyngeal hematoma as well as other potential complications associated with conscious sedation including aspiration, arrhythmia, respiratory failure and death. Alternatives to treatment were discussed, questions were answered. Patient is willing to proceed.    Medication Adjustments/Labs and Tests Ordered: Current medicines are reviewed at length with the patient today.  Concerns regarding medicines are outlined above.  Orders Placed This Encounter  Procedures  . Basic metabolic panel  . CBC  . EKG 12-Lead   No orders of the defined types were placed in this encounter.   Patient Instructions  Medication Instructions:  Your physician recommends that you continue on your current medications as directed. Please refer to the Current Medication list given to you today.  *If you need a refill on your cardiac medications before your next appointment, please call your pharmacy*   Lab Work: BMET and CBC  today  If you have labs (blood work) drawn today and your tests are completely normal, you will receive your results only by: [name] MyChart Message (if you have MyChart) OR . A paper copy in the mail If you have any lab test that is abnormal or we need to change your treatment, we will call you to review the results.   Testing/Procedures: Your physician has requested that you have a TEE. During a TEE, sound waves are used to create images of your heart. It provides your doctor with information about the size and shape of your heart and how well your heart's chambers and valves are working. In this test, a transducer is attached to the end of a flexible tube that's guided down your throat and into your esophagus (the tube leading from you mouth to your stomach) to get a more detailed image of your heart. You are not awake for the procedure. Please see the instruction sheet given to you today. For further information please visit [email or web address].   Your physician has requested that you have a cardiac catheterization. Cardiac catheterization is used to diagnose and/or treat various heart conditions. Doctors may recommend this procedure for a number of different reasons. The most common reason is to evaluate chest pain. Chest pain can be a symptom of coronary artery disease (CAD), and cardiac catheterization can show whether plaque is narrowing or blocking your heart's arteries. This procedure is also used to evaluate the valves, as well as measure the blood flow and oxygen levels in different parts of your heart. For further information please visit [email or web address]. Please follow instruction sheet, as given.    Follow-Up:  The Structural Heart Coordinator will be in touch about further appointments.   Other Instructions       Signed, Salih Williamson, PA-C  07/28/2020 3:58 PM    [facility]

## 2020-07-27 NOTE — Progress Notes (Unsigned)
[facility] [place]                                     Cardiology Office Note:    Date:  07/28/2020   ID:  Adam Arellano, DOB March 20, 1932, MRN [id number]  PCP:  Lajean Manes, MD  [facility] Cardiologist:  Candee Furbish, MD  [facility] Electrophysiologist:  None   Referring MD: Lajean Manes, MD   Set up TEE and L/RHC  History of Present Illness:    Adam Arellano is a 87 y.o. male with a hx of CAD with NSTEMI s/p DES to dRCA 2007, s/p DESx2 to mid RCA for ISR and prox RCA in 2011, HTN, HLD, PVCs, prostate CA s/p radical prostatectomy and severe MR who presents to clinic for follow up.   Pt has a history of remote RCA stenting in 2007 in the setting of NSTEMI and repeat PCI for ISR in 2011. He has been followed by Dr. Marlou Porch. He recently developed LE edema and was placed on lasix by his PCP, Dr. Felipa Eth. A new, loud murmur was also noted prompting echocardiography. This showed EF 55-60% with severe anteriorly directed MR and severe LAE and probable flail segment of posterior leaflet. He was seen by Dr. Burt Knack in consultation for potential TEER for treatment of his severe MR. Plans were made for MitraClip work up but pt wished to think about it further. The patient called back later to report being ready to start work up and was added to my schedule for discussion.   Today he presents to clinic for follow up. Here with daughter. A couple weeks ago he had an episode of shortness of breath that scared him. The paramedics were called and he reportedly checked out okay. He has done well ever since but has dyspnea on exertion. No LE edema since started on Lasix. No orthopnea or PND. No chest pain.     Past Medical History:  Diagnosis Date  . Asthma    a. Childhood.  . Coronary artery disease    a. NSTEMI s/p DES to dRCA 2007. b. Angina 07/2009: s/p DESx2 to mid RCA for ISR and prox RCA, EF 65%. c. Stress test 05/2013: low risk, no  evidence of ischemia, EF 61%.  . Dyslipidemia   . GERD (gastroesophageal reflux disease)   . Hypertension   . Prostate cancer [facility])    a. s/p radical prostatectomy.  [name] PVC's (premature ventricular contractions)   . Transient global amnesia    a. Adm 2009: imaging nonacute, had resolved.    Past Surgical History:  Procedure Laterality Date  . MASTOIDECTOMY     L ear  . PROSTATECTOMY      Current Medications: Current Meds  Medication Sig  . ciprofloxacin-dexamethasone (CIPRODEX) OTIC suspension as needed.  . clopidogrel (PLAVIX) 75 MG tablet TAKE 1 TABLET DAILY  . furosemide (LASIX) 20 MG tablet Take 20 mg by mouth daily.  . hydrochlorothiazide (HYDRODIURIL) 25 MG tablet Take 25 mg by mouth daily.  [name] losartan (COZAAR) 25 MG tablet TAKE 1 TABLET DAILY  . metoprolol tartrate (LOPRESSOR) 25 MG tablet Take 25 mg by mouth daily.   . Multiple Vitamin (MULTIVITAMIN WITH MINERALS) TABS tablet Take 1 tablet by mouth daily.  . nitroGLYCERIN (NITROSTAT) 0.4 MG SL tablet PLACE 1 TABLET UNDER THE TONGUE AT ONSET OF CHEST PAIN EVERY 5 MINTUES  UP TO 3 TIMES AS NEEDED     Allergies:   Lisinopril and Simvastatin   Social History   Socioeconomic History  . Marital status: Married    Spouse name: Not on file  . Number of children: Not on file  . Years of education: Not on file  . Highest education level: Not on file  Occupational History  . Not on file  Tobacco Use  . Smoking status: Former Research scientist (life sciences)  . Smokeless tobacco: Never Used  . Tobacco comment: Smoked for 15 years  Vaping Use  . Vaping Use: Never used  Substance and Sexual Activity  . Alcohol use: Yes    Comment: rare  . Drug use: No  . Sexual activity: Not on file  Other Topics Concern  . Not on file  Social History Narrative  . Not on file   Social Determinants of Health   Financial Resource Strain: Not on file  Food Insecurity: Not on file  Transportation Needs: Not on file  Physical Activity: Not on file  Stress:  Not on file  Social Connections: Not on file     Family History: The patient's family history is negative for CAD.  ROS:   Please see the history of present illness.    All other systems reviewed and are negative.  EKGs/Labs/Other Studies Reviewed:    The following studies were reviewed today:  Echo 06/18/20 IMPRESSIONS 1. Left ventricular ejection fraction, by estimation, is 55 to 60%. The  left ventricle has normal function. The left ventricle has no regional  wall motion abnormalities. Left ventricular diastolic parameters are  consistent with Grade II diastolic  dysfunction (pseudonormalization). Elevated left ventricular end-diastolic  pressure.  2. Right ventricular systolic function is normal. The right ventricular  size is normal. There is mildly elevated pulmonary artery systolic  pressure.  3. Left atrial size was severely dilated.  4. Likely severe mitral regurgitation directed anteriorly. The mitral  valve is normal in structure. Severe mitral valve regurgitation. No  evidence of mitral stenosis.  5. The aortic valve is calcified. There is mild calcification of the  aortic valve. There is mild thickening of the aortic valve. Aortic valve  regurgitation is not visualized. No aortic stenosis is present.  6. Aortic dilatation noted. There is mild dilatation of the descending  aorta, measuring 37 mm.  7. The inferior vena cava is normal in size with greater than 50%  respiratory variability, suggesting right atrial pressure of 3 mmHg.   FINDINGS  Left Ventricle: Left ventricular ejection fraction, by estimation, is 55  to 60%. The left ventricle has normal function. The left ventricle has no  regional wall motion abnormalities. The left ventricular internal cavity  size was normal in size. There is  no left ventricular hypertrophy. Left ventricular diastolic parameters  are consistent with Grade II diastolic dysfunction (pseudonormalization).  Elevated  left ventricular end-diastolic pressure.   Right Ventricle: The right ventricular size is normal. No increase in  right ventricular wall thickness. Right ventricular systolic function is  normal. There is mildly elevated pulmonary artery systolic pressure. The  tricuspid regurgitant velocity is 3.24  m/s, and with an assumed right atrial pressure of 3 mmHg, the estimated  right ventricular systolic pressure is 02.6 mmHg.   Left Atrium: Left atrial size was severely dilated.   Right Atrium: Right atrial size was normal in size.   Pericardium: There is no evidence of pericardial effusion.   Mitral Valve: Likely severe mitral regurgitation directed anteriorly. The  mitral valve is normal in structure. Severe mitral valve regurgitation,  with anteriorly-directed jet. No evidence of mitral valve stenosis. MV  peak gradient, 12.0 mmHg. The mean  mitral valve gradient is 3.0 mmHg.   Tricuspid Valve: The tricuspid valve is normal in structure. Tricuspid  valve regurgitation is mild . No evidence of tricuspid stenosis.   Aortic Valve: The aortic valve is calcified. There is mild calcification  of the aortic valve. There is mild thickening of the aortic valve. Aortic  valve regurgitation is not visualized. No aortic stenosis is present.  Aortic valve mean gradient measures  5.0 mmHg. Aortic valve peak gradient measures 13.7 mmHg. Aortic valve  area, by VTI measures 2.04 cm.   Pulmonic Valve: The pulmonic valve was normal in structure. Pulmonic valve  regurgitation is trivial. No evidence of pulmonic stenosis.   Aorta: Aortic dilatation noted. There is mild dilatation of the descending  aorta, measuring 37 mm.   Venous: The inferior vena cava is normal in size with greater than 50%  respiratory variability, suggesting right atrial pressure of 3 mmHg.   IAS/Shunts: No atrial level shunt detected by color flow Doppler.     LEFT VENTRICLE  PLAX 2D  LVIDd:     5.50 cm Diastology   LVIDs:     3.80 cm LV e' medial:  6.64 cm/s  LV PW:     1.00 cm LV E/e' medial: 24.5  LV IVS:    0.80 cm LV e' lateral:  8.27 cm/s  LVOT diam:   2.40 cm LV E/e' lateral: 19.6  LV SV:     64  LV SV Index:  35  LVOT Area:   4.52 cm               3D Volume EF:             3D EF:    65 %             LV EDV:    227 ml             LV ESV:    80 ml             LV SV:    146 ml   RIGHT VENTRICLE  RV S prime:   22.20 cm/s  TAPSE (M-mode): 2.6 cm   LEFT ATRIUM       Index    RIGHT ATRIUM      Index  LA diam:    5.30 cm 2.91 cm/m RA Area:   16.40 cm  LA Vol (A2C):  81.4 ml 44.63 ml/m RA Volume:  50.50 ml 27.69 ml/m  LA Vol (A4C):  95.8 ml 52.53 ml/m  LA Biplane Vol: 93.9 ml 51.49 ml/m  AORTIC VALVE  AV Area (Vmax):  1.55 cm  AV Area (Vmean):  1.82 cm  AV Area (VTI):   2.04 cm  AV Vmax:      185.00 cm/s  AV Vmean:     101.000 cm/s  AV VTI:      0.315 m  AV Peak Grad:   13.7 mmHg  AV Mean Grad:   5.0 mmHg  LVOT Vmax:     63.20 cm/s  LVOT Vmean:    40.600 cm/s  LVOT VTI:     0.142 m  LVOT/AV VTI ratio: 0.45    AORTA  Ao Root diam: 3.40 cm  Ao Asc diam: 3.70 cm   MITRAL VALVE        TRICUSPID  VALVE  MV Area (PHT): cm     TR Peak grad:  42.0 mmHg  MV Area VTI:  1.28 cm   TR Vmax:    324.00 cm/s  MV Peak grad: 12.0 mmHg  MV Mean grad: 3.0 mmHg   SHUNTS  MV Vmax:    1.73 m/s   Systemic VTI: 0.14 m  MV Vmean:   77.9 cm/s  Systemic Diam: 2.40 cm  MV Decel Time: 245 msec  MR Peak grad: 97.7 mmHg  MR Mean grad: 62.7 mmHg  MR Vmax:   494.33 cm/s  MR Vmean:   375.7 cm/s  MV E velocity: 162.50 cm/s  MV A velocity: 87.25 cm/s  MV E/A ratio: 1.86    EKG:  EKG is ordered today.  The ekg ordered today demonstrates sinus with PVCs  Recent Labs: No results  found for requested labs within last 8760 hours.  Recent Lipid Panel    Component Value Date/Time   CHOL  08/27/2007 0415    118        ATP III CLASSIFICATION:  <200     mg/dL   Desirable  200-239  mg/dL   Borderline High  >=240    mg/dL   High   TRIG 53 08/27/2007 0415   HDL 34 (L) 08/27/2007 0415   CHOLHDL 3.5 08/27/2007 0415   VLDL 11 08/27/2007 0415   LDLCALC  08/27/2007 0415    73        Total Cholesterol/HDL:CHD Risk Coronary Heart Disease Risk Table                     Men   Women  1/2 Average Risk   3.4   3.3     Risk Assessment/Calculations:       Physical Exam:    VS:  BP 118/68   Pulse 70   Ht 5\' 8"  (1.727 m)   Wt 164 lb (74.4 kg)   SpO2 97%   BMI 24.94 kg/m     Wt Readings from Last 3 Encounters:  07/28/20 164 lb (74.4 kg)  06/24/20 160 lb 6.4 oz (72.8 kg)  06/15/20 153 lb (69.4 kg)     GEN:  Well nourished, well developed in no acute distress HEENT: Normal NECK: No JVD; No carotid bruits LYMPHATICS: No lymphadenopathy CARDIAC: RRR, 3/6 holosystolic murmur heart at apex and LLSB. No rubs, gallops RESPIRATORY:  Clear to auscultation without rales, wheezing or rhonchi  ABDOMEN: Soft, non-tender, non-distended MUSCULOSKELETAL:  1+ bilateral LE edema; No deformity  SKIN: Warm and dry NEUROLOGIC:  Alert and oriented x 3 PSYCHIATRIC:  Normal affect    ______________________   STS score Procedure: Isolated MVR Risk of Mortality: 4.165% Renal Failure: 4.003% Permanent Stroke: 2.754% Prolonged Ventilation: 10.034% DSW Infection: 0.097% Reoperation: 6.184% Morbidity or Mortality: 17.608% Short Length of Stay: 18.207% Long Length of Stay: 10.647%   Procedure: MV Repair Risk of Mortality: 3.245% Renal Failure: 2.212% Permanent Stroke: 3.195% Prolonged Ventilation: 6.353% DSW Infection: 0.051% Reoperation: 4.189% Morbidity or Mortality: 12.063% Short Length of Stay: 31.383% Long Length of Stay: 5.905%   ASSESSMENT:     1. Severe mitral valve regurgitation    PLAN:    In order of problems listed above:  Severe MR: pt has NYHA class II symptoms with an episode of dyspnea a couple weeks ago that scared him. This has prompted him to want to resume discussions about MitraClip. We discussed the next steps which include a TEE and L/RHC, which have  been set up for 08/06/20.  I have reviewed the risks, indications, and alternatives to cardiac catheterization and possible angioplasty/stenting with the patient. Risks include but are not limited to bleeding, infection, vascular injury, stroke, myocardial infection, arrhythmia, kidney injury, radiation-related injury in the case of prolonged fluoroscopy use, emergency cardiac surgery, and death. The patient understands the risks of serious complication is low (<4%).   After careful review of history and examination, the risks and benefits of transesophageal echocardiogram have been explained including risks of esophageal damage, perforation (1:10,000 risk), bleeding, pharyngeal hematoma as well as other potential complications associated with conscious sedation including aspiration, arrhythmia, respiratory failure and death. Alternatives to treatment were discussed, questions were answered. Patient is willing to proceed.    Medication Adjustments/Labs and Tests Ordered: Current medicines are reviewed at length with the patient today.  Concerns regarding medicines are outlined above.  Orders Placed This Encounter  Procedures  . Basic metabolic panel  . CBC  . EKG 12-Lead   No orders of the defined types were placed in this encounter.   Patient Instructions  Medication Instructions:  Your physician recommends that you continue on your current medications as directed. Please refer to the Current Medication list given to you today.  *If you need a refill on your cardiac medications before your next appointment, please call your pharmacy*   Lab Work: BMET and CBC  today  If you have labs (blood work) drawn today and your tests are completely normal, you will receive your results only by: [name] MyChart Message (if you have MyChart) OR . A paper copy in the mail If you have any lab test that is abnormal or we need to change your treatment, we will call you to review the results.   Testing/Procedures: Your physician has requested that you have a TEE. During a TEE, sound waves are used to create images of your heart. It provides your doctor with information about the size and shape of your heart and how well your heart's chambers and valves are working. In this test, a transducer is attached to the end of a flexible tube that's guided down your throat and into your esophagus (the tube leading from you mouth to your stomach) to get a more detailed image of your heart. You are not awake for the procedure. Please see the instruction sheet given to you today. For further information please visit [email or web address].   Your physician has requested that you have a cardiac catheterization. Cardiac catheterization is used to diagnose and/or treat various heart conditions. Doctors may recommend this procedure for a number of different reasons. The most common reason is to evaluate chest pain. Chest pain can be a symptom of coronary artery disease (CAD), and cardiac catheterization can show whether plaque is narrowing or blocking your heart's arteries. This procedure is also used to evaluate the valves, as well as measure the blood flow and oxygen levels in different parts of your heart. For further information please visit [email or web address]. Please follow instruction sheet, as given.    Follow-Up:  The Structural Heart Coordinator will be in touch about further appointments.   Other Instructions       Signed, Carlis Blanchard, PA-C  07/28/2020 3:58 PM    [facility]

## 2020-07-28 ENCOUNTER — Encounter: Payer: Self-pay | Admitting: Physician Assistant

## 2020-07-28 ENCOUNTER — Other Ambulatory Visit: Payer: Self-pay

## 2020-07-28 ENCOUNTER — Ambulatory Visit: Payer: Medicare Other | Admitting: Physician Assistant

## 2020-07-28 VITALS — BP 118/68 | HR 70 | Ht 68.0 in | Wt 164.0 lb

## 2020-07-28 DIAGNOSIS — I340 Nonrheumatic mitral (valve) insufficiency: Secondary | ICD-10-CM

## 2020-07-28 NOTE — Patient Instructions (Signed)
Medication Instructions:  Your physician recommends that you continue on your current medications as directed. Please refer to the Current Medication list given to you today.  *If you need a refill on your cardiac medications before your next appointment, please call your pharmacy*   Lab Work: BMET and CBC today  If you have labs (blood work) drawn today and your tests are completely normal, you will receive your results only by: [name] MyChart Message (if you have MyChart) OR . A paper copy in the mail If you have any lab test that is abnormal or we need to change your treatment, we will call you to review the results.   Testing/Procedures: Your physician has requested that you have a TEE. During a TEE, sound waves are used to create images of your heart. It provides your doctor with information about the size and shape of your heart and how well your heart's chambers and valves are working. In this test, a transducer is attached to the end of a flexible tube that's guided down your throat and into your esophagus (the tube leading from you mouth to your stomach) to get a more detailed image of your heart. You are not awake for the procedure. Please see the instruction sheet given to you today. For further information please visit [email or web address].   Your physician has requested that you have a cardiac catheterization. Cardiac catheterization is used to diagnose and/or treat various heart conditions. Doctors may recommend this procedure for a number of different reasons. The most common reason is to evaluate chest pain. Chest pain can be a symptom of coronary artery disease (CAD), and cardiac catheterization can show whether plaque is narrowing or blocking your heart's arteries. This procedure is also used to evaluate the valves, as well as measure the blood flow and oxygen levels in different parts of your heart. For further information please visit [email or web address]. Please follow instruction  sheet, as given.    Follow-Up:  The Structural Heart Coordinator will be in touch about further appointments.   Other Instructions

## 2020-07-29 LAB — CBC
Hematocrit: 32.6 % — ABNORMAL LOW (ref 37.5–51.0)
Hemoglobin: 10.8 g/dL — ABNORMAL LOW (ref 13.0–17.7)
MCH: 30.5 pg (ref 26.6–33.0)
MCHC: 33.1 g/dL (ref 31.5–35.7)
MCV: 92 fL (ref 79–97)
Platelets: 233 10*3/uL (ref 150–450)
RBC: 3.54 x10E6/uL — ABNORMAL LOW (ref 4.14–5.80)
RDW: 11.8 % (ref 11.6–15.4)
WBC: 6.8 10*3/uL (ref 3.4–10.8)

## 2020-07-29 LAB — BASIC METABOLIC PANEL
BUN/Creatinine Ratio: 14 (ref 10–24)
BUN: 23 mg/dL (ref 8–27)
CO2: 20 mmol/L (ref 20–29)
Calcium: 9 mg/dL (ref 8.6–10.2)
Chloride: 103 mmol/L (ref 96–106)
Creatinine, Ser: 1.59 mg/dL — ABNORMAL HIGH (ref 0.76–1.27)
GFR calc Af Amer: 45 mL/min/{1.73_m2} — ABNORMAL LOW (ref >59)
GFR calc non Af Amer: 39 mL/min/{1.73_m2} — ABNORMAL LOW (ref >59)
Glucose: 120 mg/dL — ABNORMAL HIGH (ref 65–99)
Potassium: 4.1 mmol/L (ref 3.5–5.2)
Sodium: 142 mmol/L (ref 134–144)

## 2020-08-04 ENCOUNTER — Other Ambulatory Visit (HOSPITAL_COMMUNITY)
Admission: RE | Admit: 2020-08-04 | Discharge: 2020-08-04 | Disposition: A | Discharge status: Home / Self Care | Payer: Medicare Other | Source: Ambulatory Visit | Attending: Cardiovascular Disease | Admitting: Cardiovascular Disease

## 2020-08-04 DIAGNOSIS — Z01812 Encounter for preprocedural laboratory examination: Secondary | ICD-10-CM | POA: Diagnosis not present

## 2020-08-04 DIAGNOSIS — Z20822 Contact with and (suspected) exposure to covid-19: Secondary | ICD-10-CM | POA: Diagnosis not present

## 2020-08-04 LAB — SARS CORONAVIRUS 2 (TAT 6-24 HRS): SARS Coronavirus 2: NEGATIVE

## 2020-08-05 ENCOUNTER — Other Ambulatory Visit: Payer: Self-pay | Admitting: Cardiovascular Disease

## 2020-08-05 ENCOUNTER — Telehealth: Payer: Self-pay | Admitting: *Deleted

## 2020-08-05 DIAGNOSIS — I340 Nonrheumatic mitral (valve) insufficiency: Secondary | ICD-10-CM

## 2020-08-05 NOTE — Telephone Encounter (Addendum)
Pt contacted pre-catheterization scheduled at [facility] for: Friday August 06, 2020 12 Noon/TEE 10 AM Verified arrival time and place: [facility]) at: 7 AM-pre-procedure hydration   Nothing to eat or drink after midnight prior to procedures, may have sips of water to take medications.  Hold: Lasix/HCTZ/Losartan-day before and day of procedure-GFR 39  Except hold medications AM meds can be  taken pre-cath with sips of water including: ASA 81 mg Plavix 75 mg  Confirmed patient has responsible adult to drive home post procedure and be with patient first 24 hours after arriving home: yes  You are allowed ONE visitor in the waiting room during the time you are at the hospital for your procedure. Both you and your visitor must wear a mask once you enter the hospital.   Reviewed procedure/mask/visitor instructions with patient.

## 2020-08-06 ENCOUNTER — Ambulatory Visit (HOSPITAL_COMMUNITY): Payer: Medicare Other | Admitting: Anesthesiology

## 2020-08-06 ENCOUNTER — Ambulatory Visit (HOSPITAL_COMMUNITY)
Admission: RE | Admit: 2020-08-06 | Discharge: 2020-08-06 | Disposition: A | Discharge status: Home / Self Care | Payer: Medicare Other | Attending: Cardiovascular Disease | Admitting: Cardiovascular Disease

## 2020-08-06 ENCOUNTER — Other Ambulatory Visit: Payer: Self-pay

## 2020-08-06 ENCOUNTER — Ambulatory Visit (HOSPITAL_COMMUNITY)
Admission: RE | Disposition: A | Discharge status: Home / Self Care | Payer: Self-pay | Source: Home / Self Care | Attending: Cardiovascular Disease

## 2020-08-06 ENCOUNTER — Ambulatory Visit (HOSPITAL_BASED_OUTPATIENT_CLINIC_OR_DEPARTMENT_OTHER)
Admission: RE | Admit: 2020-08-06 | Discharge: 2020-08-06 | Disposition: A | Discharge status: Home / Self Care | Payer: Medicare Other | Source: Ambulatory Visit | Attending: Cardiovascular Disease | Admitting: Cardiovascular Disease

## 2020-08-06 ENCOUNTER — Encounter (HOSPITAL_COMMUNITY)
Admission: RE | Disposition: A | Discharge status: Home / Self Care | Payer: Self-pay | Source: Home / Self Care | Attending: Cardiovascular Disease

## 2020-08-06 DIAGNOSIS — I2584 Coronary atherosclerosis due to calcified coronary lesion: Secondary | ICD-10-CM | POA: Diagnosis not present

## 2020-08-06 DIAGNOSIS — I10 Essential (primary) hypertension: Secondary | ICD-10-CM | POA: Diagnosis not present

## 2020-08-06 DIAGNOSIS — I081 Rheumatic disorders of both mitral and tricuspid valves: Secondary | ICD-10-CM | POA: Diagnosis not present

## 2020-08-06 DIAGNOSIS — I2510 Atherosclerotic heart disease of native coronary artery without angina pectoris: Secondary | ICD-10-CM

## 2020-08-06 DIAGNOSIS — I700 Atherosclerosis of aorta: Secondary | ICD-10-CM | POA: Insufficient documentation

## 2020-08-06 DIAGNOSIS — I252 Old myocardial infarction: Secondary | ICD-10-CM | POA: Diagnosis not present

## 2020-08-06 DIAGNOSIS — I361 Nonrheumatic tricuspid (valve) insufficiency: Secondary | ICD-10-CM | POA: Diagnosis not present

## 2020-08-06 DIAGNOSIS — Z7902 Long term (current) use of antithrombotics/antiplatelets: Secondary | ICD-10-CM | POA: Diagnosis not present

## 2020-08-06 DIAGNOSIS — Z87891 Personal history of nicotine dependence: Secondary | ICD-10-CM | POA: Diagnosis not present

## 2020-08-06 DIAGNOSIS — Z95820 Peripheral vascular angioplasty status with implants and grafts: Secondary | ICD-10-CM

## 2020-08-06 DIAGNOSIS — Z955 Presence of coronary angioplasty implant and graft: Secondary | ICD-10-CM | POA: Insufficient documentation

## 2020-08-06 DIAGNOSIS — I340 Nonrheumatic mitral (valve) insufficiency: Secondary | ICD-10-CM

## 2020-08-06 DIAGNOSIS — E785 Hyperlipidemia, unspecified: Secondary | ICD-10-CM | POA: Diagnosis present

## 2020-08-06 DIAGNOSIS — J45909 Unspecified asthma, uncomplicated: Secondary | ICD-10-CM | POA: Diagnosis not present

## 2020-08-06 DIAGNOSIS — Z79899 Other long term (current) drug therapy: Secondary | ICD-10-CM | POA: Diagnosis not present

## 2020-08-06 DIAGNOSIS — E7800 Pure hypercholesterolemia, unspecified: Secondary | ICD-10-CM | POA: Diagnosis present

## 2020-08-06 DIAGNOSIS — Z888 Allergy status to other drugs, medicaments and biological substances status: Secondary | ICD-10-CM | POA: Insufficient documentation

## 2020-08-06 HISTORY — PX: RIGHT/LEFT HEART CATH AND CORONARY ANGIOGRAPHY: CATH118266

## 2020-08-06 HISTORY — PX: CORONARY STENT INTERVENTION: CATH118234

## 2020-08-06 HISTORY — PX: TEE WITHOUT CARDIOVERSION: SHX5443

## 2020-08-06 LAB — POCT I-STAT EG7
Acid-Base Excess: 0 mmol/L (ref 0.0–2.0)
Bicarbonate: 25.3 mmol/L (ref 20.0–28.0)
Calcium, Ion: 1.25 mmol/L (ref 1.15–1.40)
HCT: 32 % — ABNORMAL LOW (ref 39.0–52.0)
Hemoglobin: 10.9 g/dL — ABNORMAL LOW (ref 13.0–17.0)
O2 Saturation: 72 %
Potassium: 3.4 mmol/L — ABNORMAL LOW (ref 3.5–5.1)
Sodium: 141 mmol/L (ref 135–145)
TCO2: 27 mmol/L (ref 22–32)
pCO2, Ven: 45.2 mmHg (ref 44.0–60.0)
pH, Ven: 7.355 (ref 7.250–7.430)
pO2, Ven: 40 mmHg (ref 32.0–45.0)

## 2020-08-06 LAB — ECHO TEE
MV M vel: 6.3 m/s
MV Peak grad: 158.8 mmHg
MV Vena cont: 0.48 cm
Radius: 0.9 cm

## 2020-08-06 LAB — POCT I-STAT 7, (LYTES, BLD GAS, ICA,H+H)
Acid-base deficit: 1 mmol/L (ref 0.0–2.0)
Bicarbonate: 24.4 mmol/L (ref 20.0–28.0)
Calcium, Ion: 1.21 mmol/L (ref 1.15–1.40)
HCT: 31 % — ABNORMAL LOW (ref 39.0–52.0)
Hemoglobin: 10.5 g/dL — ABNORMAL LOW (ref 13.0–17.0)
O2 Saturation: 99 %
Potassium: 3.4 mmol/L — ABNORMAL LOW (ref 3.5–5.1)
Sodium: 141 mmol/L (ref 135–145)
TCO2: 26 mmol/L (ref 22–32)
pCO2 arterial: 42 mmHg (ref 32.0–48.0)
pH, Arterial: 7.371 (ref 7.350–7.450)
pO2, Arterial: 151 mmHg — ABNORMAL HIGH (ref 83.0–108.0)

## 2020-08-06 LAB — POCT ACTIVATED CLOTTING TIME
Activated Clotting Time: 249 seconds
Activated Clotting Time: 285 seconds

## 2020-08-06 SURGERY — ECHOCARDIOGRAM, TRANSESOPHAGEAL
Anesthesia: Monitor Anesthesia Care

## 2020-08-06 SURGERY — RIGHT/LEFT HEART CATH AND CORONARY ANGIOGRAPHY
Anesthesia: LOCAL

## 2020-08-06 MED ORDER — ASPIRIN 81 MG PO CHEW: 81.0000 mg | CHEWABLE_TABLET | Freq: Every day | ORAL | Status: DC

## 2020-08-06 MED ORDER — CLOPIDOGREL BISULFATE 75 MG PO TABS
ORAL_TABLET | ORAL | Status: DC | PRN
  Administered 2020-08-06: 75 mg via ORAL

## 2020-08-06 MED ORDER — LABETALOL HCL 5 MG/ML IV SOLN: 10.0000 mg | INTRAVENOUS | Status: AC | PRN

## 2020-08-06 MED ORDER — HEPARIN SODIUM (PORCINE) 1000 UNIT/ML IJ SOLN
INTRAMUSCULAR | Status: AC
  Filled 2020-08-06: qty 1

## 2020-08-06 MED ORDER — LIDOCAINE HCL (PF) 1 % IJ SOLN
INTRAMUSCULAR | Status: DC | PRN
  Administered 2020-08-06 (×2): 2 mL

## 2020-08-06 MED ORDER — ASPIRIN EC 81 MG PO TBEC: 81.0000 mg | DELAYED_RELEASE_TABLET | Freq: Every day | ORAL | 2 refills | Status: DC

## 2020-08-06 MED ORDER — SODIUM CHLORIDE 0.9 % IV SOLN: INTRAVENOUS | Status: DC | PRN

## 2020-08-06 MED ORDER — CLOPIDOGREL BISULFATE 75 MG PO TABS
ORAL_TABLET | ORAL | Status: AC
  Filled 2020-08-06: qty 1

## 2020-08-06 MED ORDER — HEPARIN (PORCINE) IN NACL 1000-0.9 UT/500ML-% IV SOLN
INTRAVENOUS | Status: DC | PRN
  Administered 2020-08-06: 500 mL

## 2020-08-06 MED ORDER — FENTANYL CITRATE (PF) 100 MCG/2ML IJ SOLN
INTRAMUSCULAR | Status: AC
  Filled 2020-08-06: qty 2

## 2020-08-06 MED ORDER — ACETAMINOPHEN 325 MG PO TABS: 650.0000 mg | ORAL_TABLET | ORAL | Status: DC | PRN

## 2020-08-06 MED ORDER — BUTAMBEN-TETRACAINE-BENZOCAINE 2-2-14 % EX AERO
INHALATION_SPRAY | CUTANEOUS | Status: DC | PRN
  Administered 2020-08-06: 2 via TOPICAL

## 2020-08-06 MED ORDER — SODIUM CHLORIDE 0.9 % WEIGHT BASED INFUSION: 1.0000 mL/kg/h | INTRAVENOUS | Status: DC

## 2020-08-06 MED ORDER — LIDOCAINE HCL (PF) 2 % IJ SOLN
INTRAMUSCULAR | Status: DC | PRN
  Administered 2020-08-06: 60 mg via INTRADERMAL

## 2020-08-06 MED ORDER — ONDANSETRON HCL 4 MG/2ML IJ SOLN: 4.0000 mg | Freq: Four times a day (QID) | INTRAMUSCULAR | Status: DC | PRN

## 2020-08-06 MED ORDER — SODIUM CHLORIDE 0.9% FLUSH: 3.0000 mL | Freq: Two times a day (BID) | INTRAVENOUS | Status: DC

## 2020-08-06 MED ORDER — VERAPAMIL HCL 2.5 MG/ML IV SOLN
INTRAVENOUS | Status: DC | PRN
  Administered 2020-08-06: 10 mL via INTRA_ARTERIAL

## 2020-08-06 MED ORDER — IOHEXOL 350 MG/ML SOLN
INTRAVENOUS | Status: DC | PRN
  Administered 2020-08-06: 70 mL

## 2020-08-06 MED ORDER — PROPOFOL 500 MG/50ML IV EMUL
INTRAVENOUS | Status: DC | PRN
  Administered 2020-08-06: 50 ug/kg/min via INTRAVENOUS
  Administered 2020-08-06: 100 ug/kg/min via INTRAVENOUS

## 2020-08-06 MED ORDER — ASPIRIN 81 MG PO CHEW: 81.0000 mg | CHEWABLE_TABLET | ORAL | Status: DC

## 2020-08-06 MED ORDER — HEPARIN (PORCINE) IN NACL 1000-0.9 UT/500ML-% IV SOLN
INTRAVENOUS | Status: AC
  Filled 2020-08-06: qty 1000

## 2020-08-06 MED ORDER — LABETALOL HCL 5 MG/ML IV SOLN
INTRAVENOUS | Status: AC
  Filled 2020-08-06: qty 4

## 2020-08-06 MED ORDER — SODIUM CHLORIDE 0.9 % IV SOLN: INTRAVENOUS | Status: DC

## 2020-08-06 MED ORDER — SODIUM CHLORIDE 0.9 % IV SOLN
250.0000 mL | INTRAVENOUS | Status: DC | PRN
Start: 2020-08-06 — End: 2020-08-06

## 2020-08-06 MED ORDER — CLOPIDOGREL BISULFATE 75 MG PO TABS: 75.0000 mg | ORAL_TABLET | ORAL | Status: DC

## 2020-08-06 MED ORDER — VERAPAMIL HCL 2.5 MG/ML IV SOLN
INTRAVENOUS | Status: AC
  Filled 2020-08-06: qty 2

## 2020-08-06 MED ORDER — SODIUM CHLORIDE 0.9% FLUSH: 3.0000 mL | INTRAVENOUS | Status: DC | PRN

## 2020-08-06 MED ORDER — ROSUVASTATIN CALCIUM 10 MG PO TABS: 10.0000 mg | ORAL_TABLET | Freq: Every day | ORAL | 3 refills | Status: DC

## 2020-08-06 MED ORDER — MIDAZOLAM HCL 2 MG/2ML IJ SOLN
INTRAMUSCULAR | Status: AC
  Filled 2020-08-06: qty 2

## 2020-08-06 MED ORDER — SODIUM CHLORIDE 0.9 % WEIGHT BASED INFUSION: 3.0000 mL/kg/h | INTRAVENOUS | Status: AC

## 2020-08-06 MED ORDER — SODIUM CHLORIDE 0.9% FLUSH
3.0000 mL | INTRAVENOUS | Status: DC | PRN
Start: 2020-08-06 — End: 2020-08-06

## 2020-08-06 MED ORDER — FENTANYL CITRATE (PF) 100 MCG/2ML IJ SOLN
INTRAMUSCULAR | Status: DC | PRN
  Administered 2020-08-06 (×2): 25 ug via INTRAVENOUS

## 2020-08-06 MED ORDER — MIDAZOLAM HCL 2 MG/2ML IJ SOLN
INTRAMUSCULAR | Status: DC | PRN
  Administered 2020-08-06 (×2): 0.5 mg via INTRAVENOUS

## 2020-08-06 MED ORDER — LIDOCAINE HCL (PF) 1 % IJ SOLN
INTRAMUSCULAR | Status: AC
  Filled 2020-08-06: qty 30

## 2020-08-06 MED ORDER — HYDRALAZINE HCL 20 MG/ML IJ SOLN: 10.0000 mg | INTRAMUSCULAR | Status: AC | PRN

## 2020-08-06 MED ORDER — HEPARIN SODIUM (PORCINE) 1000 UNIT/ML IJ SOLN
INTRAMUSCULAR | Status: DC | PRN
  Administered 2020-08-06 (×2): 3000 [IU] via INTRAVENOUS
  Administered 2020-08-06: 4000 [IU] via INTRAVENOUS

## 2020-08-06 MED ORDER — SODIUM CHLORIDE 0.9 % IV SOLN: 250.0000 mL | INTRAVENOUS | Status: DC | PRN

## 2020-08-06 SURGICAL SUPPLY — 20 items
BALLN SAPPHIRE 3.0X20 (BALLOONS) ×2
BALLN SAPPHIRE ~~LOC~~ 3.75X15 (BALLOONS) ×2 IMPLANT
BALLOON SAPPHIRE 3.0X20 (BALLOONS) ×1 IMPLANT
CATH 5FR JL3.5 JR4 ANG PIG MP (CATHETERS) ×2 IMPLANT
CATH BALLN WEDGE 5F 110CM (CATHETERS) ×2 IMPLANT
CATH INFINITI 5FR JL4 (CATHETERS) ×2 IMPLANT
CATH VISTA GUIDE 6FR JR4 (CATHETERS) ×2 IMPLANT
DEVICE RAD COMP TR BAND LRG (VASCULAR PRODUCTS) ×2 IMPLANT
GLIDESHEATH SLEND SS 6F .021 (SHEATH) ×2 IMPLANT
GUIDEWIRE .025 260CM (WIRE) ×2 IMPLANT
GUIDEWIRE INQWIRE 1.5J.035X260 (WIRE) ×1 IMPLANT
INQWIRE 1.5J .035X260CM (WIRE) ×2
KIT ENCORE 26 ADVANTAGE (KITS) ×2 IMPLANT
KIT HEART LEFT (KITS) ×2 IMPLANT
PACK CARDIAC CATHETERIZATION (CUSTOM PROCEDURE TRAY) ×2 IMPLANT
SHEATH GLIDE SLENDER 4/5FR (SHEATH) ×2 IMPLANT
STENT RESOLUTE ONYX 3.5X30 (Permanent Stent) ×2 IMPLANT
TRANSDUCER W/STOPCOCK (MISCELLANEOUS) ×2 IMPLANT
TUBING CIL FLEX 10 FLL-RA (TUBING) ×2 IMPLANT
WIRE COUGAR XT STRL 190CM (WIRE) ×2 IMPLANT

## 2020-08-06 NOTE — Anesthesia Procedure Notes (Signed)
Procedure Name: MAC Date/Time: 08/06/2020 9:40 AM Performed by: Georgia Duff, CRNA Pre-anesthesia Checklist: Patient identified, Emergency Drugs available, Suction available and Patient being monitored Oxygen Delivery Method: Nasal cannula Induction Type: IV induction Placement Confirmation: positive ETCO2 Dental Injury: Teeth and Oropharynx as per pre-operative assessment

## 2020-08-06 NOTE — Anesthesia Postprocedure Evaluation (Signed)
Anesthesia Post Note  Patient: Adam Arellano  Procedure(s) Performed: TRANSESOPHAGEAL ECHOCARDIOGRAM (TEE) (N/A )     Patient location during evaluation: PACU Anesthesia Type: MAC Level of consciousness: awake and alert Pain management: pain level controlled Vital Signs Assessment: post-procedure vital signs reviewed and stable Respiratory status: spontaneous breathing, nonlabored ventilation, respiratory function stable and patient connected to nasal cannula oxygen Cardiovascular status: stable and blood pressure returned to baseline Postop Assessment: no apparent nausea or vomiting Anesthetic complications: no   No complications documented.  Last Vitals:  Vitals:   08/06/20 1212 08/06/20 1215  BP: (!) 179/106 (!) 164/90  Pulse: 93 89  Resp: 19 12  Temp:    SpO2: 98% 98%    Last Pain:  Vitals:   08/06/20 1214  TempSrc:   PainSc: 0-No pain                 Tiajuana Amass

## 2020-08-06 NOTE — Progress Notes (Signed)
  Echocardiogram Echocardiogram Transesophageal has been performed.  Johny Chess 08/06/2020, 10:18 AM

## 2020-08-06 NOTE — Progress Notes (Signed)
Octaviano Batty PA in to see and ok to d/c home; right radial site bruised no hematoma and no bleeding

## 2020-08-06 NOTE — Anesthesia Preprocedure Evaluation (Signed)
Anesthesia Evaluation  Patient identified by MRN, date of birth, ID band Patient awake    Reviewed: Allergy & Precautions, NPO status , Patient's Chart, lab work & pertinent test results  Airway Mallampati: II  TM Distance: >3 FB     Dental  (+) Dental Advisory Given   Pulmonary asthma , former smoker,    breath sounds clear to auscultation       Cardiovascular hypertension, Pt. on medications and Pt. on home beta blockers + CAD and + Past MI  + Valvular Problems/Murmurs MR  Rhythm:Regular Rate:Normal     Neuro/Psych negative neurological ROS     GI/Hepatic Neg liver ROS, GERD  ,  Endo/Other  negative endocrine ROS  Renal/GU Renal InsufficiencyRenal disease     Musculoskeletal   Abdominal   Peds  Hematology  (+) anemia ,   Anesthesia Other Findings   Reproductive/Obstetrics                             Anesthesia Physical Anesthesia Plan  ASA: III  Anesthesia Plan: MAC   Post-op Pain Management:    Induction:   PONV Risk Score and Plan: 1 and Treatment may vary due to age or medical condition and Propofol infusion  Airway Management Planned: Natural Airway and Nasal Cannula  Additional Equipment:   Intra-op Plan:   Post-operative Plan:   Informed Consent: I have reviewed the patients History and Physical, chart, labs and discussed the procedure including the risks, benefits and alternatives for the proposed anesthesia with the patient or authorized representative who has indicated his/her understanding and acceptance.       Plan Discussed with:   Anesthesia Plan Comments:         Anesthesia Quick Evaluation

## 2020-08-06 NOTE — Op Note (Signed)
INDICATIONS: mitral regurgitation  PROCEDURE:   Informed consent was obtained prior to the procedure. The risks, benefits and alternatives for the procedure were discussed and the patient comprehended these risks.  Risks include, but are not limited to, cough, sore throat, vomiting, nausea, somnolence, esophageal and stomach trauma or perforation, bleeding, low blood pressure, aspiration, pneumonia, infection, trauma to the teeth and death.    After a procedural time-out, the oropharynx was anesthetized with 20% benzocaine spray.   During this procedure the patient was administered IV propofol by Anesthesiology (Dr. Ola Spurr)  The transesophageal probe was inserted in the esophagus and stomach without difficulty and multiple views were obtained.  The patient was kept under observation until the patient left the procedure room.  The patient left the procedure room in stable condition.   Agitated microbubble saline contrast was not administered.  COMPLICATIONS:    There were no immediate complications.  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 Severe eccentric mitral insufficiency. Tricuspid valve prolapse with 2 +TR. Moderate pulmonary artery HTN. Normal LV function.  RECOMMENDATIONS:     Appears to have good anatomical characteristics for MitraClip.  Time Spent Directly with the Patient:  40 minutes   Adam Arellano 08/06/2020, 10:04 AM

## 2020-08-06 NOTE — Progress Notes (Signed)
Pt report given to Lorraine Lax. RN pt was accepted,

## 2020-08-06 NOTE — Discharge Summary (Signed)
Discharge Summary for Same Day PCI   Patient ID: Adam Arellano MRN: [id number]; DOB: 02-18-1932  Admit date: 08/06/2020 Discharge date: 08/06/2020  Primary Care Provider: Lajean Manes, MD  Primary Cardiologist: Candee Furbish, MD    Discharge Diagnoses    Principal Problem:   Nonrheumatic mitral valve regurgitation Active Problems:   Hyperlipidemia   Essential hypertension   Pure hypercholesterolemia   CAD S/P percutaneous coronary angioplasty    Diagnostic Studies/Procedures    TEE 0/02/9832 COMPLICATIONS:    There were no immediate complications.  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 Severe eccentric mitral insufficiency. Tricuspid valve prolapse with 2 +TR. Moderate pulmonary artery HTN. Normal LV function.  RECOMMENDATIONS:     Appears to have good anatomical characteristics for MitraClip.   Cardiac Catheterization 08/06/2020:  CORONARY STENT INTERVENTION  08/06/20  RIGHT/LEFT HEART CATH AND CORONARY ANGIOGRAPHY    Conclusion  1.  Patent left mainstem with mild nonobstructive plaquing 2.  Patent LAD with mild diffuse calcific nonobstructive stenosis 3.  Patent circumflex with mild nonobstructive stenosis 4.  Severe stenosis of a moderate caliber ramus intermedius branch, favor medical therapy and an absence of angina 5.  Severe stenosis of a large, dominant RCA, successfully treated with PTCA and stenting using a 3.5 x 22 mm resolute Onyx DES deployed in overlapping fashion with a previously implanted distal RCA stent 6.  Normal LVEDP 7.  Hemodynamics consistent with severe mitral regurgitation with a pulmonary capillary wedge pressure V wave of 45 mmHg, mean wedge pressure 24 mmHg, mean PA pressure 29 mmHg  Recommendations:  Aspirin and clopidogrel without interruption x6 months as tolerated  Cardiac surgical consultation for evaluation of severe mitral regurgitation, consideration of transcatheter  edge-to-edge mitral valve repair in this elderly patient  Medical therapy for residual CAD  Diagnostic Dominance: Right    Intervention        History of Present Illness     Adam Arellano is a 87 y.o. male with a hx of CAD with NSTEMI s/p DES to dRCA 2007, s/p DESx2 to mid RCA for ISR and prox RCA in 2011, HTN, HLD, PVCs, prostate CA s/p radical prostatectomy and severe MR.   Pt has a history of remote RCA stenting in 2007 in the setting of NSTEMI and repeat PCI for ISR in 2011. He has been followed by Dr. Marlou Porch. He recently developed LE edema and was placed on lasix by his PCP, Dr. Felipa Eth. A new, loud murmur was also noted prompting echocardiography. This showed EF 55-60% with severe anteriorly directed MR and severe LAE and probable flail segment of posterior leaflet. He was seen by Dr. Burt Knack in consultation for potential TEER for treatment of his severe MR. Recommended for MitraClip work up. Cardiac catheterization and TEE was arranged for further evaluation.  Hospital Course    TEE by Dr. 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 Severe eccentric mitral insufficiency. Tricuspid valve prolapse with 2 +TR. Moderate pulmonary artery HTN. Normal LV function.  The patient underwent cardiac cath with Dr. Burt Knack as noted above with severe stenosis of a large, dominant RCA, successfully treated with PTCA and stenting using a 3.5 x 22 mm resolute Onyx DES deployed in overlapping fashion with a previously implanted distal RCA stent. Recommended medical therapy for severe stenosis of RI branch in absence of angina.  Hemodynamics consistent with severe mitral regurgitation with a pulmonary capillary wedge pressure V wave of  45 mmHg, mean wedge pressure 24 mmHg, mean PA pressure 29 mmHg.   Plan for DAPT with ASA/Plavix for at least 6 months (already on plavix). The patient was seen by cardiac rehab while in short stay. There were no  observed complications post cath. Radial cath site was re-evaluated prior to discharge and found to be stable without any complications. Instructions/precautions regarding cath site care were given prior to discharge.  Reported muscle ache on Simvastatin. After long discussion with patient and daughter at bedside, plan to start low dose Crestor to 10mg  qd. [facility] team will arrange follow up with Dr. Roxy Manns.   Adam Arellano was seen by Dr. Burt Knack and determined stable for discharge home. Follow up with our office has been arranged. Medications are listed below. Pertinent changes include addition of ASA and Crestor.   Cath/PCI Registry Performance & Quality Measures: 1. Aspirin prescribed? - Yes 2. ADP Receptor Inhibitor (Plavix/Clopidogrel, Brilinta/Ticagrelor or Effient/Prasugrel) prescribed (includes medically managed patients)? - Yes 3. High Intensity Statin (Lipitor 40-80mg  or Crestor 20-40mg ) prescribed? - Yes 4. For EF <40%, was ACEI/ARB prescribed? - Yes 5. For EF <40%, Aldosterone Antagonist (Spironolactone or Eplerenone) prescribed? - Not Applicable (EF >/= 09%) 6. Cardiac Rehab Phase II ordered (Included Medically managed Patients)? - Yes   Discharge Vitals Blood pressure (!) 147/74, pulse 68, temperature (!) 97.3 F (36.3 C), temperature source Temporal, resp. rate 12, height 5\' 8"  (1.727 m), weight 74.4 kg, SpO2 97 %.  Filed Weights   08/06/20 0900  Weight: 74.4 kg    Last Labs & Radiologic Studies   _____________  CARDIAC CATHETERIZATION  Result Date: 08/06/2020 1.  Patent left mainstem with mild nonobstructive plaquing 2.  Patent LAD with mild diffuse calcific nonobstructive stenosis 3.  Patent circumflex with mild nonobstructive stenosis 4.  Severe stenosis of a moderate caliber ramus intermedius branch, favor medical therapy and an absence of angina 5.  Severe stenosis of a large, dominant RCA, successfully treated with PTCA and stenting using a 3.5 x 22 mm  resolute Onyx DES deployed in overlapping fashion with a previously implanted distal RCA stent 6.  Normal LVEDP 7.  Hemodynamics consistent with severe mitral regurgitation with a pulmonary capillary wedge pressure V wave of 45 mmHg, mean wedge pressure 24 mmHg, mean PA pressure 29 mmHg Recommendations:  Aspirin and clopidogrel without interruption x6 months as tolerated  Cardiac surgical consultation for evaluation of severe mitral regurgitation, consideration of transcatheter edge-to-edge mitral valve repair in this elderly patient  Medical therapy for residual CAD  ECHO TEE  Result Date: 08/06/2020    TRANSESOPHOGEAL ECHO REPORT   Patient Name:   Adam Arellano Date of Exam: 08/06/2020 Medical Rec #:  [id number]         Height:       68.0 in Accession #:    [id number]        Weight:       164.0 lb Date of Birth:  [birth year]          BSA:          1.878 m Patient Age:    87 years          BP:           152/82 mmHg Patient Gender: M                 HR:           78 bpm. Exam Location:  Outpatient Procedure: Transesophageal Echo, Color Doppler,  3D Echo and Cardiac Doppler Indications:     mitral regurgitation  History:         Patient has prior history of Echocardiogram examinations, most                  recent 06/18/2020. CAD; Risk Factors:Hypertension and                  Dyslipidemia.  Sonographer:     Johny Chess Referring Phys:  [phone] MICHAEL COOPER Diagnosing Phys: Sanda Klein MD PROCEDURE: After discussion of the risks and benefits of a TEE, an informed consent was obtained from the patient. The transesophogeal probe was passed without difficulty through the esophogus of the patient. Local oropharyngeal anesthetic was provided with Cetacaine. Sedation performed by different physician. The patient was monitored while under deep sedation. Anesthestetic sedation was provided intravenously by Anesthesiology: 70mg  of Propofol, 60mg  of Lidocaine. The patient developed no complications during the  procedure. IMPRESSIONS  1. Left ventricular ejection fraction, by estimation, is 55 to 60%. The left ventricle has low normal function. The left ventricle has no regional wall motion abnormalities. The left ventricular internal cavity size was mildly dilated.  2. Right ventricular systolic function is normal. The right ventricular size is normal. There is moderately elevated pulmonary artery systolic pressure.  3. Left atrial size was severely dilated. No left atrial/left atrial appendage thrombus was detected. The LAA emptying velocity was 50 cm/s.  4. Right atrial size was mildly dilated.  5. The P2 (middle) scallop is flail due to ruptured chordae tendinae. There is severe eccentic MR directed anteriorly and towards the septum. Flail gap is 5 mm, maximum leaflet thickness is 3-4 mm, posterior leaflet grasping length is 17 mm. By the PISA  method, the effective regurgitant orifice area is 1.0 cm, regurgitant volume 163 ml, regurgitant fraction 75%. Valve area by planimetry >6 cm. The mitral valve is myxomatous. Severe mitral valve regurgitation. The mean mitral valve gradient is 3.3 mmHg.  6. The tricuspid valve is myxomatous. Tricuspid valve regurgitation is mild to moderate.  7. The aortic valve is tricuspid. Aortic valve regurgitation is not visualized. Mild aortic valve sclerosis is present, with no evidence of aortic valve stenosis.  8. There is Moderate (Grade III) protruding plaque involving the descending aorta. Conclusion(s)/Recommendation(s): The mitral valve appears anatomically suitable for MitraClip repair. FINDINGS  Left Ventricle: Left ventricular ejection fraction, by estimation, is 55 to 60%. The left ventricle has low normal function. The left ventricle has no regional wall motion abnormalities. The left ventricular internal cavity size was mildly dilated. Right Ventricle: The right ventricular size is normal. No increase in right ventricular wall thickness. Right ventricular systolic function  is normal. There is moderately elevated pulmonary artery systolic pressure. The tricuspid regurgitant velocity is 3.61 m/s, and with an assumed right atrial pressure of 5 mmHg, the estimated right ventricular systolic pressure is 78.5 mmHg. Left Atrium: Left atrial size was severely dilated. No left atrial/left atrial appendage thrombus was detected. The LAA emptying velocity was 50 cm/s. Right Atrium: Right atrial size was mildly dilated. Pericardium: There is no evidence of pericardial effusion. Mitral Valve: The P2 (middle) scallop is flail due to ruptured chordae tendinae. There is severe eccentic MR directed anteriorly and towards the septum. Flail gap is 5 mm, maximum leaflet thickness is 3-4 mm, posterior leaflet grasping length is 17 mm. By the PISA method, the effective regurgitant orifice area is 1.0 cm, regurgitant volume 163 ml, regurgitant fraction 75%. Valve area  by planimetry >6 cm. The mitral valve is myxomatous. There is mild thickening of the mitral valve leaflet(s). Severe mitral valve regurgitation, with eccentric medially directed jet. The mean mitral valve gradient is 3.3 mmHg with average heart rate of 79 bpm. Pulmonary venous flow shows systolic flow reversal. Tricuspid Valve: The tricuspid valve is myxomatous. Tricuspid valve regurgitation is mild to moderate. There is mild prolapse of the tricuspid. Aortic Valve: The aortic valve is tricuspid. Aortic valve regurgitation is not visualized. Mild aortic valve sclerosis is present, with no evidence of aortic valve stenosis. Pulmonic Valve: The pulmonic valve was normal in structure. Pulmonic valve regurgitation is not visualized. Aorta: The aortic root, ascending aorta, aortic arch and descending aorta are all structurally normal, with no evidence of dilitation or obstruction. There is moderate (Grade III) protruding plaque involving the descending aorta. IAS/Shunts: No atrial level shunt detected by color flow Doppler.  LEFT VENTRICLE PLAX  2D LVOT diam:     2.19 cm LV SV:         46 LV SV Index:   25 LVOT Area:     3.77 cm  AORTIC VALVE LVOT Vmax:   72.05 cm/s LVOT Vmean:  48.354 cm/s LVOT VTI:    0.123 m MITRAL VALVE                   TRICUSPID VALVE MV Area (plan): 6.46 cm       TR Peak grad:   52.1 mmHg MV Mean grad:   3.3 mmHg       TR Vmax:        361.00 cm/s MR Vmax:           568.00 cm/s MR Vena Contracta: 0.48 cm     SHUNTS MR PISA Eff ROA:   1.0 cm     Systemic VTI:  0.12 m MR PISA Radius:    1.22 cm     Systemic Diam: 2.19 cm Nyquist limit:     61.6 cm/s Dani Gobble Croitoru MD Electronically signed by Sanda Klein MD Signature Date/Time: 08/06/2020/2:16:40 PM    Final     Disposition   Pt is being discharged home today in good condition.  Follow-up Plans & Appointments     Follow-up Information    Rexene Alberts, MD Follow up.   Specialty: Cardiothoracic Surgery Why: office will call with time and date Contact information: [address] [place] [address] [phone]              Discharge Instructions    Amb Referral to Cardiac Rehabilitation   Complete by: As directed    Diagnosis: Coronary Stents   After initial evaluation and assessments completed: Virtual Based Care may be provided alone or in conjunction with Phase 2 Cardiac Rehab based on patient barriers.: Yes   Diet - low sodium heart healthy   Complete by: As directed    Discharge instructions   Complete by: As directed    No driving for 24 hours. No lifting over 5 lbs for 1 week. No sexual activity for 1 week. You may return to work on 08/13/2020. Keep procedure site clean & dry. If you notice increased pain, swelling, bleeding or pus, call/return!  You may shower, but no soaking baths/hot tubs/pools for 1 week.   Discharge patient   Complete by: As directed    Two hours after procedure.   Discharge disposition: 01-Home or Self Care   Discharge patient date: 08/06/2020   Increase  activity slowly   Complete by: As directed         Discharge Medications   Allergies as of 08/06/2020      Reactions   Lisinopril Cough   Simvastatin Other (See Comments)   MUSCLE ACHES REAL BAD      Medication List    STOP taking these medications   aspirin 81 MG chewable tablet Replaced by: aspirin EC 81 MG tablet     TAKE these medications   aspirin EC 81 MG tablet Take 1 tablet (81 mg total) by mouth daily. Swallow whole. Replaces: aspirin 81 MG chewable tablet   ciprofloxacin-dexamethasone OTIC suspension Commonly known as: CIPRODEX Place 4 drops into the left ear 2 (two) times daily as needed (ear infection).   clopidogrel 75 MG tablet Commonly known as: PLAVIX TAKE 1 TABLET DAILY   furosemide 20 MG tablet Commonly known as: LASIX Take 20 mg by mouth daily.   hydrochlorothiazide 25 MG tablet Commonly known as: HYDRODIURIL Take 25 mg by mouth daily.   losartan 25 MG tablet Commonly known as: COZAAR TAKE 1 TABLET DAILY   metoprolol tartrate 25 MG tablet Commonly known as: LOPRESSOR Take 25 mg by mouth daily.   multivitamin with minerals Tabs tablet Take 1 tablet by mouth daily.   nitroGLYCERIN 0.4 MG SL tablet Commonly known as: NITROSTAT PLACE 1 TABLET UNDER THE TONGUE AT ONSET OF CHEST PAIN EVERY 5 MINTUES UP TO 3 TIMES AS NEEDED What changed: See the new instructions.   rosuvastatin 10 MG tablet Commonly known as: Crestor Take 1 tablet (10 mg total) by mouth daily.          Allergies Allergies  Allergen Reactions  . Lisinopril Cough  . Simvastatin Other (See Comments)    MUSCLE ACHES REAL BAD    Outstanding Labs/Studies   Consider OP f/u labs 6-8 weeks given statin initiation this admission.  Duration of Discharge Encounter   Greater than 30 minutes including physician time.  Hilbert Corrigan, [place] 08/06/2020, 6:18 PM

## 2020-08-06 NOTE — Transfer of Care (Signed)
Immediate Anesthesia Transfer of Care Note  Patient: Adam Arellano  Procedure(s) Performed: TRANSESOPHAGEAL ECHOCARDIOGRAM (TEE) (N/A )  Patient Location: PACU and Endoscopy Unit  Anesthesia Type:MAC  Level of Consciousness: drowsy and patient cooperative  Airway & Oxygen Therapy: Patient Spontanous Breathing and Patient connected to nasal cannula oxygen  Post-op Assessment: Report given to RN and Post -op Vital signs reviewed and stable  Post vital signs: Reviewed and stable  Last Vitals:  Vitals Value Taken Time  BP    Temp    Pulse    Resp    SpO2      Last Pain:  Vitals:   08/06/20 0900  TempSrc: Temporal  PainSc: 0-No pain         Complications: No complications documented.

## 2020-08-06 NOTE — Interval H&P Note (Signed)
History and Physical Interval Note:  08/06/2020 8:51 AM  Adam Arellano  has presented today for surgery, with the diagnosis of [place].  The various methods of treatment have been discussed with the patient and family. After consideration of risks, benefits and other options for treatment, the patient has consented to  Procedure(s): TRANSESOPHAGEAL ECHOCARDIOGRAM (TEE) (N/A) as a surgical intervention.  The patient's history has been reviewed, patient examined, no change in status, stable for surgery.  I have reviewed the patient's chart and labs.  Questions were answered to the patient's satisfaction.     [name]

## 2020-08-06 NOTE — Progress Notes (Signed)
[id number] Discussed with pt the importance of plavix with stent. Discussed NTG use, gave heart healthy diet and encouraged walking as tolerated. Discussed CRP 2 and referred to [facility]. Pt knows cannot attend until after [facility] clip. Pt will consider program at that time. Pt and daughter voiced understanding of ed. Graylon Good RN BSN 08/06/2020 2:28 PM

## 2020-08-06 NOTE — Interval H&P Note (Signed)
History and Physical Interval Note:  08/06/2020 10:59 AM  Adam Arellano  has presented today for surgery, with the diagnosis of MR.  The various methods of treatment have been discussed with the patient and family. After consideration of risks, benefits and other options for treatment, the patient has consented to  Procedure(s): RIGHT/LEFT HEART CATH AND CORONARY ANGIOGRAPHY (N/A) as a surgical intervention.  The patient's history has been reviewed, patient examined, no change in status, stable for surgery.  I have reviewed the patient's chart and labs.  Questions were answered to the patient's satisfaction.     Sherren Mocha

## 2020-08-06 NOTE — Progress Notes (Signed)
Looks exceptionally well suited for Mitraclip. Ruptured chord squarely in middle of a broad P2 scallop.

## 2020-08-06 NOTE — Discharge Instructions (Signed)
Radial Site Care  ELEVATE WRIST 24 HOURS    This sheet gives you information about how to care for yourself after your procedure. Your health care provider may also give you more specific instructions. If you have problems or questions, contact your health care provider. What can I expect after the procedure? After the procedure, it is common to have:  Bruising and tenderness at the catheter insertion area. Follow these instructions at home: Medicines  Take over-the-counter and prescription medicines only as told by your health care provider. Insertion site care  Follow instructions from your health care provider about how to take care of your insertion site. Make sure you: ? Wash your hands with soap and water before you change your bandage (dressing). If soap and water are not available, use hand sanitizer. ? Change your dressing as told by your health care provider. ? Leave stitches (sutures), skin glue, or adhesive strips in place. These skin closures may need to stay in place for 2 weeks or longer. If adhesive strip edges start to loosen and curl up, you may trim the loose edges. Do not remove adhesive strips completely unless your health care provider tells you to do that.  Check your insertion site every day for signs of infection. Check for: ? Redness, swelling, or pain. ? Fluid or blood. ? Pus or a bad smell. ? Warmth.  Do not take baths, swim, or use a hot tub until your health care provider approves.  You may shower 24-48 hours after the procedure, or as directed by your health care provider. ? Remove the dressing and gently wash the site with plain soap and water. ? Pat the area dry with a clean towel. ? Do not rub the site. That could cause bleeding.  Do not apply powder or lotion to the site. Activity  For 24 hours after the procedure, or as directed by your health care provider: ? Do not flex or bend the affected arm. ? Do not push or pull heavy objects with the  affected arm. ? Do not drive yourself home from the hospital or clinic. You may drive 24 hours after the procedure unless your health care provider tells you not to. ? Do not operate machinery or power tools.  Do not lift anything that is heavier than 10 lb (4.5 kg), or the limit that you are told, until your health care provider says that it is safe.  Ask your health care provider when it is okay to: ? Return to work or school. ? Resume usual physical activities or sports. ? Resume sexual activity.   General instructions  If the catheter site starts to bleed, raise your arm and put firm pressure on the site. If the bleeding does not stop, get help right away. This is a medical emergency.  If you went home on the same day as your procedure, a responsible adult should be with you for the first 24 hours after you arrive home.  Keep all follow-up visits as told by your health care provider. This is important. Contact a health care provider if:  You have a fever.  You have redness, swelling, or yellow drainage around your insertion site. Get help right away if:  You have unusual pain at the radial site.  The catheter insertion area swells very fast.  The insertion area is bleeding, and the bleeding does not stop when you hold steady pressure on the area.  Your arm or hand becomes pale, cool, tingly, or  numb. These symptoms may represent a serious problem that is an emergency. Do not wait to see if the symptoms will go away. Get medical help right away. Call your local emergency services (911 in the [place] Do not drive yourself to the hospital. Summary  After the procedure, it is common to have bruising and tenderness at the site.  Follow instructions from your health care provider about how to take care of your radial site wound. Check the wound every day for signs of infection.  Do not lift anything that is heavier than 10 lb (4.5 kg), or the limit that you are told, until your  health care provider says that it is safe. This information is not intended to replace advice given to you by your health care provider. Make sure you discuss any questions you have with your health care provider. Document Revised: 06/27/2017 Document Reviewed: 06/27/2017 Elsevier Patient Education  2021 [place]. Transesophageal Echocardiogram Transesophageal echocardiogram (TEE) is a test that uses sound waves to take pictures of your heart. TEE is done by passing a small probe attached to a flexible tube down the part of the body that moves food from your mouth to your stomach (esophagus). The pictures give clear images of your heart. This can help your doctor see if there are problems with your heart. Tell a doctor about:  Any allergies you have.  All medicines you are taking. This includes vitamins, herbs, eye drops, creams, and over-the-counter medicines.  Any problems you or family members have had with anesthetic medicines.  Any blood disorders you have.  Any surgeries you have had.  Any medical conditions you have.  Any swallowing problems.  Whether you have or have had a blockage in the part of the body that moves food from your mouth to your stomach.  Whether you are pregnant or may be pregnant. What are the risks? In general, this is a safe procedure. But, problems may occur, such as:  Damage to nearby structures or organs.  A tear in the part of the body that moves food from your mouth to your stomach.  Irregular heartbeat.  Hoarse voice or trouble swallowing.  Bleeding. What happens before the procedure? Medicines  Ask your doctor about changing or stopping: ? Your normal medicines. ? Vitamins, herbs, and supplements. ? Over-the-counter medicines.  Do not take aspirin or ibuprofen unless you are told to. General instructions  Follow instructions from your doctor about what you cannot eat or drink.  You will take out any dentures or dental  retainers.  Plan to have a responsible adult take you home from the hospital or clinic.  Plan to have a responsible adult care for you for the time you are told after you leave the hospital or clinic. This is important. What happens during the procedure?  An IV will be put into one of your veins.  You may be given: ? A sedative. This medicine helps you relax. ? A medicine to numb the back of your throat. This may be sprayed or gargled.  Your blood pressure, heart rate, and breathing will be watched.  You may be asked to lie on your left side.  A bite block will be placed in your mouth. This keeps you from biting the tube.  The tip of the probe will be placed into the back of your mouth.  You will be asked to swallow.  Your doctor will take pictures of your heart.  The probe and bite block will  be taken out after the test is done. The procedure may vary among doctors and hospitals.   What can I expect after the procedure?  You will be monitored until you leave the hospital or clinic. This includes checking your blood pressure, heart rate, breathing rate, and blood oxygen level.  Your throat may feel sore and numb. This will get better over time. You will not be allowed to eat or drink until the numbness has gone away.  It is common to have a sore throat for a day or two.  It is up to you to get the results of your procedure. Ask how to get your results when they are ready. Follow these instructions at home:  If you were given a sedative during your procedure, do not drive or use machines until your doctor says that it is safe.  Return to your normal activities when your doctor says that it is safe.  Keep all follow-up visits. Summary  TEE is a test that uses sound waves to take pictures of your heart.  You will be given a medicine to help you relax.  Do not drive or use machines until your doctor says that it is safe. This information is not intended to replace advice  given to you by your health care provider. Make sure you discuss any questions you have with your health care provider. Document Revised: 01/13/2020 Document Reviewed: 01/13/2020 Elsevier Patient Education  2021 [employer].

## 2020-08-09 ENCOUNTER — Encounter (HOSPITAL_COMMUNITY): Payer: Self-pay | Admitting: Cardiovascular Disease

## 2020-08-10 MED FILL — Labetalol HCl IV Soln Prefilled Syringe 20 MG/4ML (5 MG/ML): INTRAVENOUS | Qty: 4 | Status: AC

## 2020-08-10 NOTE — Progress Notes (Signed)
Adam Arellano DOB 06/29/1931  This looks like a clippable valve. The fossa looks approachable for transseptal puncture in both the SAXB and Bicaval views. LA dimensions are large enough for device steering and straddle. The eccentric jet is caused by a posterior leaflet flail at A2/P2. The posterior leaflet is measuring 1.86 cm in the 134 LVOT view. MVA measures about 6.2 cm2 using planimetry in the Transgastric view. I would still like to verify the gradient and MVA in the case prior to start. Based on this information, I'd recommend starting with an XTW and assessing for gradient.

## 2020-08-13 ENCOUNTER — Institutional Professional Consult (permissible substitution): Payer: Medicare Other | Admitting: Thoracic Surgery (Cardiothoracic Vascular Surgery)

## 2020-08-13 ENCOUNTER — Other Ambulatory Visit: Payer: Self-pay

## 2020-08-13 ENCOUNTER — Encounter: Payer: Self-pay | Admitting: Thoracic Surgery (Cardiothoracic Vascular Surgery)

## 2020-08-13 VITALS — BP 158/80 | HR 76 | Resp 20 | Ht 68.0 in

## 2020-08-13 DIAGNOSIS — I340 Nonrheumatic mitral (valve) insufficiency: Secondary | ICD-10-CM | POA: Diagnosis not present

## 2020-08-13 NOTE — Patient Instructions (Signed)
Continue all previous medications without any changes at this time  

## 2020-08-13 NOTE — H&P (View-Only) (Signed)
[facility]  MULTIDISCIPLINARY HEART VALVE CLINIC  CARDIOTHORACIC SURGERY CONSULTATION REPORT  Referring Provider is Jerline Pain, MD PCP is Lajean Manes, MD  Chief Complaint  Patient presents with  . Mitral Regurgitation    Review TEE 3/4 & Cath 3/4 for potential MitralClip  . Consult    Initial surgical eval    HPI:  Patient is an 87 year old male with history of coronary artery disease, hypertension, hyperlipidemia, GE reflux disease, transient global amnesia, and prostate cancer who has been referred for surgical consultation to discuss treatment options for recently discovered mitral valve prolapse with severe symptomatic primary mitral regurgitation.  Patient's cardiac history dates back to 2007 when he presented with an acute non-ST segment elevation myocardial infarction.  He was treated with PCI and stenting of the distal right coronary artery at that time.  In 2011 he had recurrent symptoms of angina and underwent repeat PCI and stenting of the right coronary artery for in-stent restenosis.  He has done well from a cardiac standpoint since that time and has been followed intermittently by Dr. Marlou Porch.  Approximately 2 to 3 months ago the patient began to develop symptoms of exertional shortness of breath and fatigue.  He was seen by his primary care physician and found to have a new prominent systolic murmur on physical exam.  An echocardiogram was performed demonstrating normal left ventricular systolic function with mitral valve prolapse and severe mitral regurgitation.  Patient was seen in follow-up by Dr. Marlou Porch and referred to Dr. Burt Knack for consultation.  Transesophageal echocardiogram performed August 06, 2020 confirmed the presence of mitral valve prolapse with a large flail segment involving the middle scallop of the posterior leaflet causing severe mitral regurgitation.  Left ventricular systolic function was mildly reduced with ejection fraction estimated 55  to 60%.  There was severe left atrial enlargement.  No other significant abnormalities were noted.  Diagnostic cardiac catheterization was performed and revealed severe stenosis of the mid right coronary artery which was successfully treated with PCI and stenting.  There was mild nonobstructive disease in the left anterior descending coronary artery and the left circumflex coronary artery with severe stenosis of a medium size intermediate branch.  Pulmonary artery pressures were mildly elevated with large V waves consistent with severe mitral regurgitation.  Cardiothoracic surgical consultation was requested.  Patient is widowed and lives alone in [place], [place].  He has 2 adult children and he is accompanied by his daughter for his office consultation visit today.  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  He has remained entirely functionally independent and continues to drive an automobile and 10 to management of his business.  He complains that several months ago he began to experience symptoms of exertional shortness of breath with decreased energy and increased fatigue.  He has never had any symptoms of chest discomfort either with activity or at rest.  Symptoms have progressed to the point where a couple of weeks ago he had a brief episode of resting shortness of breath which prompted him to contact EMS.  He did not require hospitalization.  Currently he gets short of breath with low-level activity.  He denies any history of PND, orthopnea, palpitations, dizzy spells, or syncope.  He has developed some chronic lower extremity edema.  He has a persistent dry nonproductive cough for the past 2 months.  The remainder of his review of systems is unrevealing.  Past Medical History:  Diagnosis Date  .  Asthma    a. Childhood.  . Coronary artery disease    a. NSTEMI s/p DES to dRCA 2007. b. Angina 07/2009: s/p DESx2 to mid RCA for ISR and prox RCA, EF 65%.  c. Stress test 05/2013: low risk, no evidence of ischemia, EF 61%.  . Dyslipidemia   . GERD (gastroesophageal reflux disease)   . Hypertension   . Prostate cancer [facility])    a. s/p radical prostatectomy.  [name] PVC's (premature ventricular contractions)   . Transient global amnesia    a. Adm 2009: imaging nonacute, had resolved.    Past Surgical History:  Procedure Laterality Date  . CORONARY STENT INTERVENTION N/A 08/06/2020   Procedure: CORONARY STENT INTERVENTION;  Surgeon: Sherren Mocha, MD;  Location: [place] [facility];  Service: Cardiovascular;  Laterality: N/A;  . MASTOIDECTOMY     L ear  . PROSTATECTOMY    . RIGHT/LEFT HEART CATH AND CORONARY ANGIOGRAPHY N/A 08/06/2020   Procedure: RIGHT/LEFT HEART CATH AND CORONARY ANGIOGRAPHY;  Surgeon: Sherren Mocha, MD;  Location: [place] [facility];  Service: Cardiovascular;  Laterality: N/A;  . TEE WITHOUT CARDIOVERSION N/A 08/06/2020   Procedure: TRANSESOPHAGEAL ECHOCARDIOGRAM (TEE);  Surgeon: Sanda Klein, MD;  Location: [facility] ENDOSCOPY;  Service: Cardiovascular;  Laterality: N/A;    Family History  Problem Relation Age of Onset  . CAD Neg Hx     Social History   Socioeconomic History  . Marital status: Married    Spouse name: Not on file  . Number of children: Not on file  . Years of education: Not on file  . Highest education level: Not on file  Occupational History  . Not on file  Tobacco Use  . Smoking status: Former Research scientist (life sciences)  . Smokeless tobacco: Never Used  . Tobacco comment: Smoked for 15 years  Vaping Use  . Vaping Use: Never used  Substance and Sexual Activity  . Alcohol use: Yes    Comment: rare  . Drug use: No  . Sexual activity: Not on file  Other Topics Concern  . Not on file  Social History Narrative  . Not on file   Social Determinants of Health   Financial Resource Strain: Not on file  Food Insecurity: Not on file  Transportation Needs: Not on file  Physical Activity: Not on file  Stress: Not on file   Social Connections: Not on file  Intimate Partner Violence: Not on file    Current Outpatient Medications  Medication Sig Dispense Refill  . aspirin EC 81 MG tablet Take 1 tablet (81 mg total) by mouth daily. Swallow whole. 150 tablet 2  . ciprofloxacin-dexamethasone (CIPRODEX) OTIC suspension Place 4 drops into the left ear 2 (two) times daily as needed (ear infection).    . clopidogrel (PLAVIX) 75 MG tablet TAKE 1 TABLET DAILY (Patient taking differently: Take 75 mg by mouth daily.) 90 tablet 3  . furosemide (LASIX) 20 MG tablet Take 20 mg by mouth daily.    [name] losartan (COZAAR) 25 MG tablet TAKE 1 TABLET DAILY (Patient taking differently: Take 25 mg by mouth daily.) 90 tablet 0  . metoprolol tartrate (LOPRESSOR) 25 MG tablet Take 25 mg by mouth daily.     . Multiple Vitamin (MULTIVITAMIN WITH MINERALS) TABS tablet Take 1 tablet by mouth daily.    . nitroGLYCERIN (NITROSTAT) 0.4 MG SL tablet PLACE 1 TABLET UNDER THE TONGUE AT ONSET OF CHEST PAIN EVERY 5 MINTUES UP TO 3 TIMES AS NEEDED (Patient taking differently: Place 0.4 mg under  the tongue every 5 (five) minutes as needed for chest pain.) 25 tablet 3  . rosuvastatin (CRESTOR) 10 MG tablet Take 1 tablet (10 mg total) by mouth daily. 30 tablet 3  . hydrochlorothiazide (HYDRODIURIL) 25 MG tablet Take 25 mg by mouth daily. (Patient not taking: Reported on 08/13/2020)     No current facility-administered medications for this visit.    Allergies  Allergen Reactions  . Lisinopril Cough  . Simvastatin Other (See Comments)    MUSCLE ACHES REAL BAD      Review of Systems:   General:  normal appetite, decreased energy, no weight gain, no weight loss, no fever  Cardiac:  no chest pain with exertion, no chest pain at rest, +SOB with exertion, one episode resting SOB, no PND, no orthopnea, no palpitations, no arrhythmia, no atrial fibrillation, + LE edema, no dizzy spells, no syncope  Respiratory:  + exertional shortness of breath, no home  oxygen, no productive cough, + dry cough, no bronchitis, no wheezing, no hemoptysis, no asthma, no pain with inspiration or cough, no sleep apnea, no CPAP at night  GI:   no difficulty swallowing, + reflux, + frequent heartburn, no hiatal hernia, no abdominal pain, + constipation, no diarrhea, no hematochezia, no hematemesis, no melena  GU:   no dysuria,  no frequency, no urinary tract infection, no hematuria, no enlarged prostate, no kidney stones, no kidney disease  Vascular:  no pain suggestive of claudication, no pain in feet, no leg cramps, no varicose veins, no DVT, no non-healing foot ulcer  Neuro:   no stroke, no TIA's, no seizures, no headaches, no temporary blindness one eye,  no slurred speech, no peripheral neuropathy, + some chronic pain in lower back, mild instability of gait, no memory/cognitive dysfunction  Musculoskeletal: no arthritis, no joint swelling, no myalgias, minor difficulty walking, slightly decreased mobility   Skin:   no rash, no itching, no skin infections, no pressure sores or ulcerations  Psych:   no anxiety, no depression, no nervousness, no unusual recent stress  Eyes:   no blurry vision, no floaters, no recent vision changes, + wears glasses or contacts  ENT:   + hearing loss, + loose or painful teeth, edentulous with full dentures  Hematologic:  + easy bruising, no abnormal bleeding, no clotting disorder, no frequent epistaxis  Endocrine:  no diabetes, does not check CBG's at home           Physical Exam:   BP (!) 158/80 (BP Location: Left Arm, Patient Position: Sitting)   Pulse 76   Resp 20   Ht 5\' 8"  (1.727 m)   SpO2 96% Comment: RA  BMI 24.94 kg/m   General:  Elderly but  well-appearing  HEENT:  Unremarkable   Neck:   no JVD, no bruits, no adenopathy   Chest:   clear to auscultation, symmetrical breath sounds, no wheezes, no rhonchi   CV:   RRR, grade IV/VI holosystolic murmur heard best at apex,  no diastolic murmur  Abdomen:  soft, non-tender,  no masses   Extremities:  warm, well-perfused, pulses diminished, 2+ bilateral LE edema  Rectal/GU  Deferred  Neuro:   Grossly non-focal and symmetrical throughout  Skin:   Clean and dry, no rashes, no breakdown   Diagnostic Tests:   ECHOCARDIOGRAM REPORT       Patient Name:  Adam Arellano Date of Exam: 06/18/2020  Medical Rec #: [id number]     Height:    68.0 in  Accession #:  [id number]    Weight:    153.0 lb  Date of Birth: 1931-07-20     BSA:     1.824 m  Patient Age:  63 years     BP:      120/80 mmHg  Patient Gender: M         HR:      74 bpm.  Exam Location: [facility]   Procedure: 2D Echo, 3D Echo, Cardiac Doppler and Color Doppler   Indications:  R01.1 Murmur    History:    Patient has no prior history of Echocardiogram  examinations. CAD         and Previous Myocardial Infarction, Arrythmias:PVC,         Signs/Symptoms:Murmur and Shortness of Breath; Risk         Factors:Hypertension, Dyslipidemia and Former Smoker.  Edema,         NSTEMI.    Sonographer:  Deliah Boston RDCS  Referring Phys: [place]    1. Left ventricular ejection fraction, by estimation, is 55 to 60%. The  left ventricle has normal function. The left ventricle has no regional  wall motion abnormalities. Left ventricular diastolic parameters are  consistent with Grade II diastolic  dysfunction (pseudonormalization). Elevated left ventricular end-diastolic  pressure.  2. Right ventricular systolic function is normal. The right ventricular  size is normal. There is mildly elevated pulmonary artery systolic  pressure.  3. Left atrial size was severely dilated.  4. Likely severe mitral regurgitation directed anteriorly. The mitral  valve is normal in structure. Severe mitral valve regurgitation. No  evidence of mitral stenosis.  5. The aortic valve is  calcified. There is mild calcification of the  aortic valve. There is mild thickening of the aortic valve. Aortic valve  regurgitation is not visualized. No aortic stenosis is present.  6. Aortic dilatation noted. There is mild dilatation of the descending  aorta, measuring 37 mm.  7. The inferior vena cava is normal in size with greater than 50%  respiratory variability, suggesting right atrial pressure of 3 mmHg.   FINDINGS  Left Ventricle: Left ventricular ejection fraction, by estimation, is 55  to 60%. The left ventricle has normal function. The left ventricle has no  regional wall motion abnormalities. The left ventricular internal cavity  size was normal in size. There is  no left ventricular hypertrophy. Left ventricular diastolic parameters  are consistent with Grade II diastolic dysfunction (pseudonormalization).  Elevated left ventricular end-diastolic pressure.   Right Ventricle: The right ventricular size is normal. No increase in  right ventricular wall thickness. Right ventricular systolic function is  normal. There is mildly elevated pulmonary artery systolic pressure. The  tricuspid regurgitant velocity is 3.24  m/s, and with an assumed right atrial pressure of 3 mmHg, the estimated  right ventricular systolic pressure is 82.9 mmHg.   Left Atrium: Left atrial size was severely dilated.   Right Atrium: Right atrial size was normal in size.   Pericardium: There is no evidence of pericardial effusion.   Mitral Valve: Likely severe mitral regurgitation directed anteriorly. The  mitral valve is normal in structure. Severe mitral valve regurgitation,  with anteriorly-directed jet. No evidence of mitral valve stenosis. MV  peak gradient, 12.0 mmHg. The mean  mitral valve gradient is 3.0 mmHg.   Tricuspid Valve: The tricuspid valve is normal in structure. Tricuspid  valve regurgitation is mild . No evidence of tricuspid stenosis.   Aortic Valve: The aortic valve  is calcified. There is mild calcification  of the aortic valve. There is mild thickening of the aortic valve. Aortic  valve regurgitation is not visualized. No aortic stenosis is present.  Aortic valve mean gradient measures  5.0 mmHg. Aortic valve peak gradient measures 13.7 mmHg. Aortic valve  area, by VTI measures 2.04 cm.   Pulmonic Valve: The pulmonic valve was normal in structure. Pulmonic valve  regurgitation is trivial. No evidence of pulmonic stenosis.   Aorta: Aortic dilatation noted. There is mild dilatation of the descending  aorta, measuring 37 mm.   Venous: The inferior vena cava is normal in size with greater than 50%  respiratory variability, suggesting right atrial pressure of 3 mmHg.   IAS/Shunts: No atrial level shunt detected by color flow Doppler.     LEFT VENTRICLE  PLAX 2D  LVIDd:     5.50 cm Diastology  LVIDs:     3.80 cm LV e' medial:  6.64 cm/s  LV PW:     1.00 cm LV E/e' medial: 24.5  LV IVS:    0.80 cm LV e' lateral:  8.27 cm/s  LVOT diam:   2.40 cm LV E/e' lateral: 19.6  LV SV:     64  LV SV Index:  35  LVOT Area:   4.52 cm               3D Volume EF:             3D EF:    65 %             LV EDV:    227 ml             LV ESV:    80 ml             LV SV:    146 ml   RIGHT VENTRICLE  RV S prime:   22.20 cm/s  TAPSE (M-mode): 2.6 cm   LEFT ATRIUM       Index    RIGHT ATRIUM      Index  LA diam:    5.30 cm 2.91 cm/m RA Area:   16.40 cm  LA Vol (A2C):  81.4 ml 44.63 ml/m RA Volume:  50.50 ml 27.69 ml/m  LA Vol (A4C):  95.8 ml 52.53 ml/m  LA Biplane Vol: 93.9 ml 51.49 ml/m  AORTIC VALVE  AV Area (Vmax):  1.55 cm  AV Area (Vmean):  1.82 cm  AV Area (VTI):   2.04 cm  AV Vmax:      185.00 cm/s  AV Vmean:     101.000 cm/s  AV VTI:      0.315 m  AV Peak Grad:   13.7 mmHg   AV Mean Grad:   5.0 mmHg  LVOT Vmax:     63.20 cm/s  LVOT Vmean:    40.600 cm/s  LVOT VTI:     0.142 m  LVOT/AV VTI ratio: 0.45    AORTA  Ao Root diam: 3.40 cm  Ao Asc diam: 3.70 cm   MITRAL VALVE        TRICUSPID VALVE  MV Area (PHT): cm     TR Peak grad:  42.0 mmHg  MV Area VTI:  1.28 cm   TR Vmax:    324.00 cm/s  MV Peak grad: 12.0 mmHg  MV Mean grad: 3.0 mmHg   SHUNTS  MV Vmax:    1.73 m/s   Systemic VTI: 0.14 m  MV Vmean:  77.9 cm/s  Systemic Diam: 2.40 cm  MV Decel Time: 245 msec  MR Peak grad: 97.7 mmHg  MR Mean grad: 62.7 mmHg  MR Vmax:   494.33 cm/s  MR Vmean:   375.7 cm/s  MV E velocity: 162.50 cm/s  MV A velocity: 87.25 cm/s  MV E/A ratio: 1.86   Skeet Latch MD  Electronically signed by Skeet Latch MD  Signature Date/Time: 06/18/2020/5:58:11 PM        TRANSESOPHOGEAL ECHO REPORT       Patient Name:  Adam Arellano Date of Exam: 08/06/2020  Medical Rec #: [id number]     Height:    68.0 in  Accession #:  [id number]    Weight:    164.0 lb  Date of Birth: May 23, 1932     BSA:     1.878 m  Patient Age:  78 years     BP:      152/82 mmHg  Patient Gender: M         HR:      78 bpm.  Exam Location: Outpatient   Procedure: Transesophageal Echo, Color Doppler, 3D Echo and Cardiac  Doppler   Indications:   mitral regurgitation    History:     Patient has prior history of Echocardiogram examinations,  most          recent 06/18/2020. CAD; Risk Factors:Hypertension and          Dyslipidemia.    Sonographer:   Johny Chess  Referring Phys: [phone] MICHAEL COOPER  Diagnosing Phys: Sanda Klein MD   PROCEDURE: After discussion of the risks and benefits of a TEE, an  informed consent was obtained from the patient. The transesophogeal probe  was passed without difficulty through the esophogus of the  patient. Local  oropharyngeal anesthetic was provided  with Cetacaine. Sedation performed by different physician. The patient was  monitored while under deep sedation. Anesthestetic sedation was provided  intravenously by Anesthesiology: 70mg  of Propofol, 60mg  of Lidocaine. The  patient developed no  complications during the procedure.   IMPRESSIONS    1. Left ventricular ejection fraction, by estimation, is 55 to 60%. The  left ventricle has low normal function. The left ventricle has no regional  wall motion abnormalities. The left ventricular internal cavity size was  mildly dilated.  2. Right ventricular systolic function is normal. The right ventricular  size is normal. There is moderately elevated pulmonary artery systolic  pressure.  3. Left atrial size was severely dilated. No left atrial/left atrial  appendage thrombus was detected. The LAA emptying velocity was 50 cm/s.  4. Right atrial size was mildly dilated.  5. The P2 (middle) scallop is flail due to ruptured chordae tendinae.  There is severe eccentic MR directed anteriorly and towards the septum.  Flail gap is 5 mm, maximum leaflet thickness is 3-4 mm, posterior leaflet  grasping length is 17 mm. By the PISA  method, the effective regurgitant orifice area is 1.0 cm, regurgitant  volume 163 ml, regurgitant fraction 75%. Valve area by planimetry >6 cm.  The mitral valve is myxomatous. Severe mitral valve regurgitation. The  mean mitral valve gradient is 3.3  mmHg.  6. The tricuspid valve is myxomatous. Tricuspid valve regurgitation is  mild to moderate.  7. The aortic valve is tricuspid. Aortic valve regurgitation is not  visualized. Mild aortic valve sclerosis is present, with no evidence of  aortic valve stenosis.  8. There is Moderate (Grade III) protruding plaque involving the  descending aorta.   Conclusion(s)/Recommendation(s): The mitral valve appears anatomically  suitable for MitraClip repair.    FINDINGS  Left Ventricle: Left ventricular ejection fraction, by estimation, is 55  to 60%. The left ventricle has low normal function. The left ventricle has  no regional wall motion abnormalities. The left ventricular internal  cavity size was mildly dilated.   Right Ventricle: The right ventricular size is normal. No increase in  right ventricular wall thickness. Right ventricular systolic function is  normal. There is moderately elevated pulmonary artery systolic pressure.  The tricuspid regurgitant velocity is  3.61 m/s, and with an assumed right atrial pressure of 5 mmHg, the  estimated right ventricular systolic pressure is 32.6 mmHg.   Left Atrium: Left atrial size was severely dilated. No left atrial/left  atrial appendage thrombus was detected. The LAA emptying velocity was 50  cm/s.   Right Atrium: Right atrial size was mildly dilated.   Pericardium: There is no evidence of pericardial effusion.   Mitral Valve: The P2 (middle) scallop is flail due to ruptured chordae  tendinae. There is severe eccentic MR directed anteriorly and towards the  septum. Flail gap is 5 mm, maximum leaflet thickness is 3-4 mm, posterior  leaflet grasping length is 17 mm.  By the PISA method, the effective regurgitant orifice area is 1.0 cm,  regurgitant volume 163 ml, regurgitant fraction 75%. Valve area by  planimetry >6 cm. The mitral valve is myxomatous. There is mild  thickening of the mitral valve leaflet(s). Severe  mitral valve regurgitation, with eccentric medially directed jet. The mean  mitral valve gradient is 3.3 mmHg with average heart rate of 79 bpm.  Pulmonary venous flow shows systolic flow reversal.   Tricuspid Valve: The tricuspid valve is myxomatous. Tricuspid valve  regurgitation is mild to moderate. There is mild prolapse of the  tricuspid.   Aortic Valve: The aortic valve is tricuspid. Aortic valve regurgitation is  not visualized. Mild aortic valve sclerosis is  present, with no evidence  of aortic valve stenosis.   Pulmonic Valve: The pulmonic valve was normal in structure. Pulmonic valve  regurgitation is not visualized.   Aorta: The aortic root, ascending aorta, aortic arch and descending aorta  are all structurally normal, with no evidence of dilitation or  obstruction. There is moderate (Grade III) protruding plaque involving the  descending aorta.   IAS/Shunts: No atrial level shunt detected by color flow Doppler.     LEFT VENTRICLE  PLAX 2D  LVOT diam:   2.19 cm  LV SV:     46  LV SV Index:  25  LVOT Area:   3.77 cm     AORTIC VALVE  LVOT Vmax:  72.05 cm/s  LVOT Vmean: 48.354 cm/s  LVOT VTI:  0.123 m   MITRAL VALVE          TRICUSPID VALVE  MV Area (plan): 6.46 cm    TR Peak grad:  52.1 mmHg  MV Mean grad:  3.3 mmHg    TR Vmax:    361.00 cm/s  MR Vmax:      568.00 cm/s  MR Vena Contracta: 0.48 cm   SHUNTS  MR PISA Eff ROA:  1.0 cm   Systemic VTI: 0.12 m  MR PISA Radius:  1.22 cm   Systemic Diam: 2.19 cm  Nyquist limit:   61.6 cm/s   Dani Gobble Croitoru MD  Electronically signed by Sanda Klein MD  Signature Date/Time: 08/06/2020/2:16:40 PM  CORONARY STENT INTERVENTION  RIGHT/LEFT HEART CATH AND CORONARY ANGIOGRAPHY    Conclusion  1.  Patent left mainstem with mild nonobstructive plaquing 2.  Patent LAD with mild diffuse calcific nonobstructive stenosis 3.  Patent circumflex with mild nonobstructive stenosis 4.  Severe stenosis of a moderate caliber ramus intermedius branch, favor medical therapy and an absence of angina 5.  Severe stenosis of a large, dominant RCA, successfully treated with PTCA and stenting using a 3.5 x 22 mm resolute Onyx DES deployed in overlapping fashion with a previously implanted distal RCA stent 6.  Normal LVEDP 7.  Hemodynamics consistent with severe mitral regurgitation with a pulmonary capillary wedge pressure V wave of 45  mmHg, mean wedge pressure 24 mmHg, mean PA pressure 29 mmHg  Recommendations:  Aspirin and clopidogrel without interruption x6 months as tolerated  Cardiac surgical consultation for evaluation of severe mitral regurgitation, consideration of transcatheter edge-to-edge mitral valve repair in this elderly patient  Medical therapy for residual CAD   Recommendations  Antiplatelet/Anticoag Recommend uninterrupted dual antiplatelet therapy with Aspirin 81mg  daily and Clopidogrel 75mg  daily for a minimum of 6 months (stable ischemic heart disease-Class I recommendation).   Surgeon Notes    08/06/2020 10:07 AM Op Note signed by Sanda Klein, MD    Indications  Nonrheumatic mitral valve regurgitation [I34.0 (ICD-10-CM)]   Procedural Details  Technical Details INDICATION: 87 year old gentleman with severe mitral regurgitation, referred for right and left heart catheterization as part of an evaluation for transcatheter edge-to-edge mitral valve repair.  He underwent transesophageal echo study this morning.  Following todays studies, he will be referred for cardiac surgical consultation.  He has a history of coronary artery disease with prior stenting.  He has been maintained on long-term clopidogrel.  He has been demonstrated to have severe primary mitral regurgitation secondary to myxomatous degeneration of the mitral valve with a flail segment of the posterior leaflet.  PROCEDURAL DETAILS: There was an indwelling IV in a right antecubital vein. Using normal sterile technique, the IV was changed out for a 5 Fr brachial sheath over a 0.018 inch wire. The right wrist was then prepped, draped, and anesthetized with 1% lidocaine. Using the modified Seldinger technique a 5/6 French Slender sheath was placed in the right radial artery. Intra-arterial verapamil was administered through the radial artery sheath. IV heparin was administered after a JR4 catheter was advanced into the central aorta. A  Swan-Ganz catheter was used for the right heart catheterization. Standard protocol was followed for recording of right heart pressures and sampling of oxygen saturations. Fick cardiac output was calculated. Standard Judkins catheters were used for selective coronary angiography. LV pressure is recorded and an aortic valve pullback is performed.  PCI of the right coronary artery was performed after the diagnostic procedure.  Additional heparin is administered.  A therapeutic ACT is achieved.  See PCI portion of the note for details.  There were no immediate procedural complications. The patient was transferred to the post catheterization recovery area for further monitoring.    Estimated blood loss <50 mL.   During this procedure medications were administered to achieve and maintain moderate conscious sedation while the patient's heart rate, blood pressure, and oxygen saturation were continuously monitored and I was present face-to-face 100% of this time.   Medications (Filter: Administrations occurring from 1054 to 1216 on 08/06/20) (important) Continuous medications are totaled by the amount administered until 08/06/20 1216.    Heparin (Porcine) in NaCl 1000-0.9 UT/500ML-% SOLN (mL) Total volume:  500 mL  Date/Time Rate/Dose/Volume Action   08/06/20 1056 500 mL Given    Heparin (Porcine) in NaCl 1000-0.9 UT/500ML-% SOLN (mL) Total volume:  500 mL  Date/Time Rate/Dose/Volume Action   08/06/20 1056 500 mL Given    fentaNYL (SUBLIMAZE) injection (mcg) Total dose:  50 mcg  Date/Time Rate/Dose/Volume Action   08/06/20 1103 25 mcg Given   1146 25 mcg Given    midazolam (VERSED) injection (mg) Total dose:  1 mg  Date/Time Rate/Dose/Volume Action   08/06/20 1103 0.5 mg Given   1147 0.5 mg Given    lidocaine (PF) (XYLOCAINE) 1 % injection (mL) Total volume:  4 mL  Date/Time Rate/Dose/Volume Action   08/06/20 1111 2 mL Given   1113 2 mL Given    Radial Cocktail/Verapamil only  (mL) Total volume:  10 mL  Date/Time Rate/Dose/Volume Action   08/06/20 1113 10 mL Given    heparin sodium (porcine) injection (Units) Total dose:  10,000 Units  Date/Time Rate/Dose/Volume Action   08/06/20 1130 4,000 Units Given   1142 3,000 Units Given   1155 3,000 Units Given    clopidogrel (PLAVIX) tablet (mg) Total dose:  75 mg  Date/Time Rate/Dose/Volume Action   08/06/20 1147 75 mg Given    iohexol (OMNIPAQUE) 350 MG/ML injection (mL) Total volume:  70 mL  Date/Time Rate/Dose/Volume Action   08/06/20 1211 70 mL Given    Sedation Time  Sedation Time Physician-1: 1 hour 3 minutes 14 seconds   Contrast  Medication Name Total Dose  iohexol (OMNIPAQUE) 350 MG/ML injection 70 mL    Radiation/Fluoro  Fluoro time: 10.7 (min) DAP: 20093 (mGycm2) Cumulative Air Kerma: 357 (mGy)   Coronary Findings   Diagnostic Dominance: Right  Left Main  Ost LM to Mid LM lesion is 30% stenosed. The lesion is calcified. Mild diffuse nonobstructive left main plaquing.  Left Anterior Descending  There is mild diffuse disease throughout the vessel.  Mid LAD lesion is 40% stenosed. The lesion is calcified.  Ramus Intermedius  Ramus lesion is 80% stenosed. The lesion is calcified. Lung diffuse lesion in the proximal intermediate branch, medium caliber vessel.  Left Circumflex  Vessel was injected. Vessel is normal in caliber. There is mild diffuse disease throughout the vessel. The left circumflex has mild diffuse nonobstructive plaquing  Right Coronary Artery  Ost RCA to Prox RCA lesion is 20% stenosed. The lesion was previously treated using a drug eluting stent over 2 years ago.  Prox RCA to Mid RCA lesion is 80% stenosed. The lesion is eccentric. The lesion is mildly calcified.  Mid RCA to Dist RCA lesion is 10% stenosed. The lesion was previously treated using a drug eluting stent over 2 years ago.   Intervention   Prox RCA to Mid RCA lesion  Stent  Pre-stent  angioplasty was performed using a BALLOON SAPPHIRE 3.0X20. A drug-eluting stent was successfully placed using a STENT RESOLUTE ONYX 3.5X30. Post-stent angioplasty was performed using a BALLOON SAPPHIRE [employer] [id number].  Post-Intervention Lesion Assessment  The intervention was successful. Pre-interventional TIMI flow is 3. Post-intervention TIMI flow is 3. No complications occurred at this lesion.  There is a 0% residual stenosis post intervention.   Right Heart  Right Heart Pressures Hemodynamic findings consistent with mild pulmonary hypertension. LV EDP is normal. Large V waves, 45 mmHg, suggestive of severe mitral regurgitation   Coronary Diagrams   Diagnostic Dominance: Right    Intervention     Implants    Permanent Stent   Stent Resolute Onyx 3.5x30 -  [id number] - Implanted  Inventory item: Leonie Douglas ONYX 3.5X30 Model/Cat number: [id number]  Manufacturer: [employer] Lot number: [id number]  Device identifier: [id number] Device identifier type: GS1  Area Of Implantation: Mid RCA     GUDID Information  Request status Successful    Brand name: Resolute OnyxT Version/Model: [id number]  Company name: [employer] MRI safety info as of 08/06/20: MR Conditional  Contains dry or latex rubber: No    GMDN P.T. name: Drug-eluting coronary artery stent, non-bioabsorbable-polymer-coated     As of 08/06/2020  Status: Implanted        Syngo Images  Show images for CARDIAC CATHETERIZATION  Images on Long Term Storage  Show images for [place], [place] to Procedure Log  Procedure Log     Hemo Data  Flowsheet Row Most Recent Value  Fick Cardiac Output 6.29 L/min  Fick Cardiac Output Index 3.35 (L/min)/BSA  RA A Wave 5 mmHg  RA V Wave 4 mmHg  RA Mean 3 mmHg  RV Systolic Pressure 41 mmHg  RV Diastolic Pressure 1 mmHg  RV EDP 7 mmHg  PA Systolic Pressure 44 mmHg  PA Diastolic Pressure 14 mmHg  PA Mean 29 mmHg  PW A Wave 20  mmHg  PW V Wave 45 mmHg  PW Mean 24 mmHg  AO Systolic Pressure 094 mmHg  AO Diastolic Pressure 65 mmHg  AO Mean 96 mmHg  LV Systolic Pressure 709 mmHg  LV Diastolic Pressure 6 mmHg  LV EDP 16 mmHg  AOp Systolic Pressure 628 mmHg  AOp Diastolic Pressure 65 mmHg  AOp Mean Pressure 95 mmHg  LVp Systolic Pressure 366 mmHg  LVp Diastolic Pressure 8 mmHg  LVp EDP Pressure 18 mmHg  QP/QS 1  TPVR Index 8.64 HRUI  TSVR Index 29.2 HRUI  PVR SVR Ratio 0.05  TPVR/TSVR Ratio 0.3      Impression:  Patient has mitral valve prolapse with stage D severe symptomatic primary mitral regurgitation.  He describes a several month history of worsening symptoms of exertional shortness of breath and fatigue consistent with chronic diastolic congestive heart failure, [place] Heart Association functional class III.  He recently had a brief episode of resting shortness of breath which prompted him to call EMS although he never required hospitalization.  He has not had any symptoms of chest tightness or chest pressure to suggest concurrent angina pectoris.  I personally reviewed the patient's recent echocardiograms and diagnostic cardiac catheterization.  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  The left atrium is dilated.  There is low normal left ventricular systolic function.  Diagnostic cardiac catheterization revealed mildly elevated pulmonary artery pressures with large V waves consistent with severe mitral regurgitation.  The patient did have high-grade stenosis of the mid right coronary artery but this was successfully treated with PCI and stenting.  The patient otherwise has nonobstructive disease with exception of a medium sized intermediate  branch that would best be treated without revascularization.  I agree the patient needs mitral valve repair.  Risks associated with conventional surgery would be elevated because of the patient's advanced age and numerous medical problems.  Although the patient would probably survive elective surgery, his postoperative recovery might be quite prolonged and could lead to prolonged and possible permanent changes in his functional capacity  and quality of life.   Plan:  The patient and his daughter were counseled at length regarding treatment alternatives for management of severe symptomatic mitral regurgitation.  Alternative approaches such as conventional surgical mitral valve repair or replacement with or without minimally-invasive techniques, percutaneous edge-to-edge Mitraclip repair, and continued medical therapy without intervention were compared and contrasted at length.  The risks associated with conventional surgery were discussed in detail, as were expectations for post-operative convalescence, and why I would be reluctant to consider this patient a candidate for conventional surgery.  Issues specific to Mitraclip repair were discussed including questions about long term freedom from persistent or recurrent mitral regurgitation, the potential for device migration or embolization, and other technical complications related to the procedure itself.  Current availability of other catheter-based treatments for mitral regurgitation was discussed.  Long-term prognosis with medical therapy was discussed. This discussion was placed in the context of the patient's own specific clinical presentation and past medical history.  All of their questions have been addressed.  The patient is interested in proceeding with transcatheter edge-to-edge repair in the near future.  He will call and return to our office in the future only as needed.    I spent in excess of 90 minutes during the conduct of this office  consultation and >50% of this time involved direct face-to-face encounter with the patient for counseling and/or coordination of their care.      Valentina Gu. Roxy Manns, MD 08/13/2020 9:40 AM

## 2020-08-13 NOTE — Progress Notes (Signed)
[facility]  MULTIDISCIPLINARY HEART VALVE CLINIC  CARDIOTHORACIC SURGERY CONSULTATION REPORT  Referring Provider is Jerline Pain, MD PCP is Lajean Manes, MD  Chief Complaint  Patient presents with   Mitral Regurgitation    Review TEE 3/4 & Cath 3/4 for potential MitralClip   Consult    Initial surgical eval    HPI:  Patient is an 87 year old male with history of coronary artery disease, hypertension, hyperlipidemia, GE reflux disease, transient global amnesia, and prostate cancer who has been referred for surgical consultation to discuss treatment options for recently discovered mitral valve prolapse with severe symptomatic primary mitral regurgitation.  Patient's cardiac history dates back to 2007 when he presented with an acute non-ST segment elevation myocardial infarction.  He was treated with PCI and stenting of the distal right coronary artery at that time.  In 2011 he had recurrent symptoms of angina and underwent repeat PCI and stenting of the right coronary artery for in-stent restenosis.  He has done well from a cardiac standpoint since that time and has been followed intermittently by Dr. Marlou Porch.  Approximately 2 to 3 months ago the patient began to develop symptoms of exertional shortness of breath and fatigue.  He was seen by his primary care physician and found to have a new prominent systolic murmur on physical exam.  An echocardiogram was performed demonstrating normal left ventricular systolic function with mitral valve prolapse and severe mitral regurgitation.  Patient was seen in follow-up by Dr. Marlou Porch and referred to Dr. Burt Knack for consultation.  Transesophageal echocardiogram performed August 06, 2020 confirmed the presence of mitral valve prolapse with a large flail segment involving the middle scallop of the posterior leaflet causing severe mitral regurgitation.  Left ventricular systolic function was mildly reduced with ejection fraction estimated 55  to 60%.  There was severe left atrial enlargement.  No other significant abnormalities were noted.  Diagnostic cardiac catheterization was performed and revealed severe stenosis of the mid right coronary artery which was successfully treated with PCI and stenting.  There was mild nonobstructive disease in the left anterior descending coronary artery and the left circumflex coronary artery with severe stenosis of a medium size intermediate branch.  Pulmonary artery pressures were mildly elevated with large V waves consistent with severe mitral regurgitation.  Cardiothoracic surgical consultation was requested.  Patient is widowed and lives alone in [place], [place].  He has 2 adult children and he is accompanied by his daughter for his office consultation visit today.  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  He has remained entirely functionally independent and continues to drive an automobile and 10 to management of his business.  He complains that several months ago he began to experience symptoms of exertional shortness of breath with decreased energy and increased fatigue.  He has never had any symptoms of chest discomfort either with activity or at rest.  Symptoms have progressed to the point where a couple of weeks ago he had a brief episode of resting shortness of breath which prompted him to contact EMS.  He did not require hospitalization.  Currently he gets short of breath with low-level activity.  He denies any history of PND, orthopnea, palpitations, dizzy spells, or syncope.  He has developed some chronic lower extremity edema.  He has a persistent dry nonproductive cough for the past 2 months.  The remainder of his review of systems is unrevealing.  Past Medical History:  Diagnosis Date  Asthma    a. Childhood.   Coronary artery disease    a. NSTEMI s/p DES to dRCA 2007. b. Angina 07/2009: s/p DESx2 to mid RCA for ISR and prox RCA, EF 65%.  c. Stress test 05/2013: low risk, no evidence of ischemia, EF 61%.   Dyslipidemia    GERD (gastroesophageal reflux disease)    Hypertension    Prostate cancer ([place])    a. s/p radical prostatectomy.   PVC's (premature ventricular contractions)    Transient global amnesia    a. Adm 2009: imaging nonacute, had resolved.    Past Surgical History:  Procedure Laterality Date   CORONARY STENT INTERVENTION N/A 08/06/2020   Procedure: CORONARY STENT INTERVENTION;  Surgeon: Sherren Mocha, MD;  Location: [facility];  Service: Cardiovascular;  Laterality: N/A;   MASTOIDECTOMY     L ear   PROSTATECTOMY     RIGHT/LEFT HEART CATH AND CORONARY ANGIOGRAPHY N/A 08/06/2020   Procedure: RIGHT/LEFT HEART CATH AND CORONARY ANGIOGRAPHY;  Surgeon: Sherren Mocha, MD;  Location: [facility];  Service: Cardiovascular;  Laterality: N/A;   TEE WITHOUT CARDIOVERSION N/A 08/06/2020   Procedure: TRANSESOPHAGEAL ECHOCARDIOGRAM (TEE);  Surgeon: Sanda Klein, MD;  Location: [facility];  Service: Cardiovascular;  Laterality: N/A;    Family History  Problem Relation Age of Onset   CAD Neg Hx     Social History   Socioeconomic History   Marital status: Married    Spouse name: Not on file   Number of children: Not on file   Years of education: Not on file   Highest education level: Not on file  Occupational History   Not on file  Tobacco Use   Smoking status: Former Smoker   Smokeless tobacco: Never Used   Tobacco comment: Smoked for 15 years  Vaping Use   Vaping Use: Never used  Substance and Sexual Activity   Alcohol use: Yes    Comment: rare   Drug use: No   Sexual activity: Not on file  Other Topics Concern   Not on file  Social History Narrative   Not on file   Social Determinants of Health   Financial Resource Strain: Not on file  Food Insecurity: Not on file  Transportation Needs: Not on file  Physical Activity: Not on file  Stress: Not on file   Social Connections: Not on file  Intimate Partner Violence: Not on file    Current Outpatient Medications  Medication Sig Dispense Refill   aspirin EC 81 MG tablet Take 1 tablet (81 mg total) by mouth daily. Swallow whole. 150 tablet 2   ciprofloxacin-dexamethasone (CIPRODEX) OTIC suspension Place 4 drops into the left ear 2 (two) times daily as needed (ear infection).     clopidogrel (PLAVIX) 75 MG tablet TAKE 1 TABLET DAILY (Patient taking differently: Take 75 mg by mouth daily.) 90 tablet 3   furosemide (LASIX) 20 MG tablet Take 20 mg by mouth daily.     losartan (COZAAR) 25 MG tablet TAKE 1 TABLET DAILY (Patient taking differently: Take 25 mg by mouth daily.) 90 tablet 0   metoprolol tartrate (LOPRESSOR) 25 MG tablet Take 25 mg by mouth daily.      Multiple Vitamin (MULTIVITAMIN WITH MINERALS) TABS tablet Take 1 tablet by mouth daily.     nitroGLYCERIN (NITROSTAT) 0.4 MG SL tablet PLACE 1 TABLET UNDER THE TONGUE AT ONSET OF CHEST PAIN EVERY 5 MINTUES UP TO 3 TIMES AS NEEDED (Patient taking differently: Place 0.4 mg under  the tongue every 5 (five) minutes as needed for chest pain.) 25 tablet 3   rosuvastatin (CRESTOR) 10 MG tablet Take 1 tablet (10 mg total) by mouth daily. 30 tablet 3   hydrochlorothiazide (HYDRODIURIL) 25 MG tablet Take 25 mg by mouth daily. (Patient not taking: Reported on 08/13/2020)     No current facility-administered medications for this visit.    Allergies  Allergen Reactions   Lisinopril Cough   Simvastatin Other (See Comments)    MUSCLE ACHES REAL BAD      Review of Systems:   General:  normal appetite, decreased energy, no weight gain, no weight loss, no fever  Cardiac:  no chest pain with exertion, no chest pain at rest, +SOB with exertion, one episode resting SOB, no PND, no orthopnea, no palpitations, no arrhythmia, no atrial fibrillation, + LE edema, no dizzy spells, no syncope  Respiratory:  + exertional shortness of breath, no home  oxygen, no productive cough, + dry cough, no bronchitis, no wheezing, no hemoptysis, no asthma, no pain with inspiration or cough, no sleep apnea, no CPAP at night  GI:   no difficulty swallowing, + reflux, + frequent heartburn, no hiatal hernia, no abdominal pain, + constipation, no diarrhea, no hematochezia, no hematemesis, no melena  GU:   no dysuria,  no frequency, no urinary tract infection, no hematuria, no enlarged prostate, no kidney stones, no kidney disease  Vascular:  no pain suggestive of claudication, no pain in feet, no leg cramps, no varicose veins, no DVT, no non-healing foot ulcer  Neuro:   no stroke, no TIA's, no seizures, no headaches, no temporary blindness one eye,  no slurred speech, no peripheral neuropathy, + some chronic pain in lower back, mild instability of gait, no memory/cognitive dysfunction  Musculoskeletal: no arthritis, no joint swelling, no myalgias, minor difficulty walking, slightly decreased mobility   Skin:   no rash, no itching, no skin infections, no pressure sores or ulcerations  Psych:   no anxiety, no depression, no nervousness, no unusual recent stress  Eyes:   no blurry vision, no floaters, no recent vision changes, + wears glasses or contacts  ENT:   + hearing loss, + loose or painful teeth, edentulous with full dentures  Hematologic:  + easy bruising, no abnormal bleeding, no clotting disorder, no frequent epistaxis  Endocrine:  no diabetes, does not check CBG's at home           Physical Exam:   BP (!) 158/80 (BP Location: Left Arm, Patient Position: Sitting)    Pulse 76    Resp 20    Ht 5\' 8"  (1.727 m)    SpO2 96% Comment: RA   BMI 24.94 kg/m   General:  Elderly but  well-appearing  HEENT:  Unremarkable   Neck:   no JVD, no bruits, no adenopathy   Chest:   clear to auscultation, symmetrical breath sounds, no wheezes, no rhonchi   CV:   RRR, grade IV/VI holosystolic murmur heard best at apex,  no diastolic murmur  Abdomen:  soft, non-tender,  no masses   Extremities:  warm, well-perfused, pulses diminished, 2+ bilateral LE edema  Rectal/GU  Deferred  Neuro:   Grossly non-focal and symmetrical throughout  Skin:   Clean and dry, no rashes, no breakdown   Diagnostic Tests:   ECHOCARDIOGRAM REPORT       Patient Name:  SELSO MANNOR Date of Exam: 06/18/2020  Medical Rec #: [id number]     Height:  68.0 in  Accession #:  [id number]    Weight:    153.0 lb  Date of Birth: [birth year]     BSA:     1.824 m  Patient Age:  66 years     BP:      120/80 mmHg  Patient Gender: M         HR:      74 bpm.  Exam Location: [facility]   Procedure: 2D Echo, 3D Echo, Cardiac Doppler and Color Doppler   Indications:  R01.1 Murmur    History:    Patient has no prior history of Echocardiogram  examinations. CAD         and Previous Myocardial Infarction, Arrythmias:PVC,         Signs/Symptoms:Murmur and Shortness of Breath; Risk         Factors:Hypertension, Dyslipidemia and Former Smoker.  Edema,         NSTEMI.    Sonographer:  Deliah Boston RDCS  Referring Phys: [place]    1. Left ventricular ejection fraction, by estimation, is 55 to 60%. The  left ventricle has normal function. The left ventricle has no regional  wall motion abnormalities. Left ventricular diastolic parameters are  consistent with Grade II diastolic  dysfunction (pseudonormalization). Elevated left ventricular end-diastolic  pressure.  2. Right ventricular systolic function is normal. The right ventricular  size is normal. There is mildly elevated pulmonary artery systolic  pressure.  3. Left atrial size was severely dilated.  4. Likely severe mitral regurgitation directed anteriorly. The mitral  valve is normal in structure. Severe mitral valve regurgitation. No  evidence of mitral stenosis.  5. The aortic valve is  calcified. There is mild calcification of the  aortic valve. There is mild thickening of the aortic valve. Aortic valve  regurgitation is not visualized. No aortic stenosis is present.  6. Aortic dilatation noted. There is mild dilatation of the descending  aorta, measuring 37 mm.  7. The inferior vena cava is normal in size with greater than 50%  respiratory variability, suggesting right atrial pressure of 3 mmHg.   FINDINGS  Left Ventricle: Left ventricular ejection fraction, by estimation, is 55  to 60%. The left ventricle has normal function. The left ventricle has no  regional wall motion abnormalities. The left ventricular internal cavity  size was normal in size. There is  no left ventricular hypertrophy. Left ventricular diastolic parameters  are consistent with Grade II diastolic dysfunction (pseudonormalization).  Elevated left ventricular end-diastolic pressure.   Right Ventricle: The right ventricular size is normal. No increase in  right ventricular wall thickness. Right ventricular systolic function is  normal. There is mildly elevated pulmonary artery systolic pressure. The  tricuspid regurgitant velocity is 3.24  m/s, and with an assumed right atrial pressure of 3 mmHg, the estimated  right ventricular systolic pressure is 67.1 mmHg.   Left Atrium: Left atrial size was severely dilated.   Right Atrium: Right atrial size was normal in size.   Pericardium: There is no evidence of pericardial effusion.   Mitral Valve: Likely severe mitral regurgitation directed anteriorly. The  mitral valve is normal in structure. Severe mitral valve regurgitation,  with anteriorly-directed jet. No evidence of mitral valve stenosis. MV  peak gradient, 12.0 mmHg. The mean  mitral valve gradient is 3.0 mmHg.   Tricuspid Valve: The tricuspid valve is normal in structure. Tricuspid  valve regurgitation is mild . No evidence of tricuspid stenosis.  Aortic Valve: The aortic valve  is calcified. There is mild calcification  of the aortic valve. There is mild thickening of the aortic valve. Aortic  valve regurgitation is not visualized. No aortic stenosis is present.  Aortic valve mean gradient measures  5.0 mmHg. Aortic valve peak gradient measures 13.7 mmHg. Aortic valve  area, by VTI measures 2.04 cm.   Pulmonic Valve: The pulmonic valve was normal in structure. Pulmonic valve  regurgitation is trivial. No evidence of pulmonic stenosis.   Aorta: Aortic dilatation noted. There is mild dilatation of the descending  aorta, measuring 37 mm.   Venous: The inferior vena cava is normal in size with greater than 50%  respiratory variability, suggesting right atrial pressure of 3 mmHg.   IAS/Shunts: No atrial level shunt detected by color flow Doppler.     LEFT VENTRICLE  PLAX 2D  LVIDd:     5.50 cm Diastology  LVIDs:     3.80 cm LV e' medial:  6.64 cm/s  LV PW:     1.00 cm LV E/e' medial: 24.5  LV IVS:    0.80 cm LV e' lateral:  8.27 cm/s  LVOT diam:   2.40 cm LV E/e' lateral: 19.6  LV SV:     64  LV SV Index:  35  LVOT Area:   4.52 cm               3D Volume EF:             3D EF:    65 %             LV EDV:    227 ml             LV ESV:    80 ml             LV SV:    146 ml   RIGHT VENTRICLE  RV S prime:   22.20 cm/s  TAPSE (M-mode): 2.6 cm   LEFT ATRIUM       Index    RIGHT ATRIUM      Index  LA diam:    5.30 cm 2.91 cm/m RA Area:   16.40 cm  LA Vol (A2C):  81.4 ml 44.63 ml/m RA Volume:  50.50 ml 27.69 ml/m  LA Vol (A4C):  95.8 ml 52.53 ml/m  LA Biplane Vol: 93.9 ml 51.49 ml/m  AORTIC VALVE  AV Area (Vmax):  1.55 cm  AV Area (Vmean):  1.82 cm  AV Area (VTI):   2.04 cm  AV Vmax:      185.00 cm/s  AV Vmean:     101.000 cm/s  AV VTI:      0.315 m  AV Peak Grad:   13.7 mmHg   AV Mean Grad:   5.0 mmHg  LVOT Vmax:     63.20 cm/s  LVOT Vmean:    40.600 cm/s  LVOT VTI:     0.142 m  LVOT/AV VTI ratio: 0.45    AORTA  Ao Root diam: 3.40 cm  Ao Asc diam: 3.70 cm   MITRAL VALVE        TRICUSPID VALVE  MV Area (PHT): cm     TR Peak grad:  42.0 mmHg  MV Area VTI:  1.28 cm   TR Vmax:    324.00 cm/s  MV Peak grad: 12.0 mmHg  MV Mean grad: 3.0 mmHg   SHUNTS  MV Vmax:    1.73 m/s   Systemic VTI:  0.14 m  MV Vmean:   77.9 cm/s  Systemic Diam: 2.40 cm  MV Decel Time: 245 msec  MR Peak grad: 97.7 mmHg  MR Mean grad: 62.7 mmHg  MR Vmax:   494.33 cm/s  MR Vmean:   375.7 cm/s  MV E velocity: 162.50 cm/s  MV A velocity: 87.25 cm/s  MV E/A ratio: 1.86   Skeet Latch MD  Electronically signed by Skeet Latch MD  Signature Date/Time: 06/18/2020/5:58:11 PM        TRANSESOPHOGEAL ECHO REPORT       Patient Name:  YEHONATAN GRANDISON Date of Exam: 08/06/2020  Medical Rec #: [id number]     Height:    68.0 in  Accession #:  [id number]    Weight:    164.0 lb  Date of Birth: [birth year]     BSA:     1.878 m  Patient Age:  60 years     BP:      152/82 mmHg  Patient Gender: M         HR:      78 bpm.  Exam Location: Outpatient   Procedure: Transesophageal Echo, Color Doppler, 3D Echo and Cardiac  Doppler   Indications:   mitral regurgitation    History:     Patient has prior history of Echocardiogram examinations,  most          recent 06/18/2020. CAD; Risk Factors:Hypertension and          Dyslipidemia.    Sonographer:   Johny Chess  Referring Phys: [phone] MICHAEL COOPER  Diagnosing Phys: Sanda Klein MD   PROCEDURE: After discussion of the risks and benefits of a TEE, an  informed consent was obtained from the patient. The transesophogeal probe  was passed without difficulty through the esophogus of the  patient. Local  oropharyngeal anesthetic was provided  with Cetacaine. Sedation performed by different physician. The patient was  monitored while under deep sedation. Anesthestetic sedation was provided  intravenously by Anesthesiology: 70mg  of Propofol, 60mg  of Lidocaine. The  patient developed no  complications during the procedure.   IMPRESSIONS    1. Left ventricular ejection fraction, by estimation, is 55 to 60%. The  left ventricle has low normal function. The left ventricle has no regional  wall motion abnormalities. The left ventricular internal cavity size was  mildly dilated.  2. Right ventricular systolic function is normal. The right ventricular  size is normal. There is moderately elevated pulmonary artery systolic  pressure.  3. Left atrial size was severely dilated. No left atrial/left atrial  appendage thrombus was detected. The LAA emptying velocity was 50 cm/s.  4. Right atrial size was mildly dilated.  5. The P2 (middle) scallop is flail due to ruptured chordae tendinae.  There is severe eccentic MR directed anteriorly and towards the septum.  Flail gap is 5 mm, maximum leaflet thickness is 3-4 mm, posterior leaflet  grasping length is 17 mm. By the PISA  method, the effective regurgitant orifice area is 1.0 cm, regurgitant  volume 163 ml, regurgitant fraction 75%. Valve area by planimetry >6 cm.  The mitral valve is myxomatous. Severe mitral valve regurgitation. The  mean mitral valve gradient is 3.3  mmHg.  6. The tricuspid valve is myxomatous. Tricuspid valve regurgitation is  mild to moderate.  7. The aortic valve is tricuspid. Aortic valve regurgitation is not  visualized. Mild aortic valve sclerosis is present, with no evidence of  aortic valve stenosis.  8. There is Moderate (  Grade III) protruding plaque involving the  descending aorta.   Conclusion(s)/Recommendation(s): The mitral valve appears anatomically  suitable for MitraClip repair.    FINDINGS  Left Ventricle: Left ventricular ejection fraction, by estimation, is 55  to 60%. The left ventricle has low normal function. The left ventricle has  no regional wall motion abnormalities. The left ventricular internal  cavity size was mildly dilated.   Right Ventricle: The right ventricular size is normal. No increase in  right ventricular wall thickness. Right ventricular systolic function is  normal. There is moderately elevated pulmonary artery systolic pressure.  The tricuspid regurgitant velocity is  3.61 m/s, and with an assumed right atrial pressure of 5 mmHg, the  estimated right ventricular systolic pressure is 78.6 mmHg.   Left Atrium: Left atrial size was severely dilated. No left atrial/left  atrial appendage thrombus was detected. The LAA emptying velocity was 50  cm/s.   Right Atrium: Right atrial size was mildly dilated.   Pericardium: There is no evidence of pericardial effusion.   Mitral Valve: The P2 (middle) scallop is flail due to ruptured chordae  tendinae. There is severe eccentic MR directed anteriorly and towards the  septum. Flail gap is 5 mm, maximum leaflet thickness is 3-4 mm, posterior  leaflet grasping length is 17 mm.  By the PISA method, the effective regurgitant orifice area is 1.0 cm,  regurgitant volume 163 ml, regurgitant fraction 75%. Valve area by  planimetry >6 cm. The mitral valve is myxomatous. There is mild  thickening of the mitral valve leaflet(s). Severe  mitral valve regurgitation, with eccentric medially directed jet. The mean  mitral valve gradient is 3.3 mmHg with average heart rate of 79 bpm.  Pulmonary venous flow shows systolic flow reversal.   Tricuspid Valve: The tricuspid valve is myxomatous. Tricuspid valve  regurgitation is mild to moderate. There is mild prolapse of the  tricuspid.   Aortic Valve: The aortic valve is tricuspid. Aortic valve regurgitation is  not visualized. Mild aortic valve sclerosis is  present, with no evidence  of aortic valve stenosis.   Pulmonic Valve: The pulmonic valve was normal in structure. Pulmonic valve  regurgitation is not visualized.   Aorta: The aortic root, ascending aorta, aortic arch and descending aorta  are all structurally normal, with no evidence of dilitation or  obstruction. There is moderate (Grade III) protruding plaque involving the  descending aorta.   IAS/Shunts: No atrial level shunt detected by color flow Doppler.     LEFT VENTRICLE  PLAX 2D  LVOT diam:   2.19 cm  LV SV:     46  LV SV Index:  25  LVOT Area:   3.77 cm     AORTIC VALVE  LVOT Vmax:  72.05 cm/s  LVOT Vmean: 48.354 cm/s  LVOT VTI:  0.123 m   MITRAL VALVE          TRICUSPID VALVE  MV Area (plan): 6.46 cm    TR Peak grad:  52.1 mmHg  MV Mean grad:  3.3 mmHg    TR Vmax:    361.00 cm/s  MR Vmax:      568.00 cm/s  MR Vena Contracta: 0.48 cm   SHUNTS  MR PISA Eff ROA:  1.0 cm   Systemic VTI: 0.12 m  MR PISA Radius:  1.22 cm   Systemic Diam: 2.19 cm  Nyquist limit:   61.6 cm/s   Dani Gobble Croitoru MD  Electronically signed by Sanda Klein MD  Signature Date/Time: 08/06/2020/2:16:40  PM        CORONARY STENT INTERVENTION  RIGHT/LEFT HEART CATH AND CORONARY ANGIOGRAPHY    Conclusion  1.  Patent left mainstem with mild nonobstructive plaquing 2.  Patent LAD with mild diffuse calcific nonobstructive stenosis 3.  Patent circumflex with mild nonobstructive stenosis 4.  Severe stenosis of a moderate caliber ramus intermedius branch, favor medical therapy and an absence of angina 5.  Severe stenosis of a large, dominant RCA, successfully treated with PTCA and stenting using a 3.5 x 22 mm resolute Onyx DES deployed in overlapping fashion with a previously implanted distal RCA stent 6.  Normal LVEDP 7.  Hemodynamics consistent with severe mitral regurgitation with a pulmonary capillary wedge pressure V wave of 45  mmHg, mean wedge pressure 24 mmHg, mean PA pressure 29 mmHg  Recommendations:  Aspirin and clopidogrel without interruption x6 months as tolerated  Cardiac surgical consultation for evaluation of severe mitral regurgitation, consideration of transcatheter edge-to-edge mitral valve repair in this elderly patient  Medical therapy for residual CAD   Recommendations  Antiplatelet/Anticoag Recommend uninterrupted dual antiplatelet therapy with Aspirin 81mg  daily and Clopidogrel 75mg  daily for a minimum of 6 months (stable ischemic heart disease-Class I recommendation).   Surgeon Notes    08/06/2020 10:07 AM Op Note signed by Sanda Klein, MD    Indications  Nonrheumatic mitral valve regurgitation [I34.0 (ICD-10-CM)]   Procedural Details  Technical Details INDICATION: 87 year old gentleman with severe mitral regurgitation, referred for right and left heart catheterization as part of an evaluation for transcatheter edge-to-edge mitral valve repair.  He underwent transesophageal echo study this morning.  Following todays studies, he will be referred for cardiac surgical consultation.  He has a history of coronary artery disease with prior stenting.  He has been maintained on long-term clopidogrel.  He has been demonstrated to have severe primary mitral regurgitation secondary to myxomatous degeneration of the mitral valve with a flail segment of the posterior leaflet.  PROCEDURAL DETAILS: There was an indwelling IV in a right antecubital vein. Using normal sterile technique, the IV was changed out for a 5 Fr brachial sheath over a 0.018 inch wire. The right wrist was then prepped, draped, and anesthetized with 1% lidocaine. Using the modified Seldinger technique a 5/6 French Slender sheath was placed in the right radial artery. Intra-arterial verapamil was administered through the radial artery sheath. IV heparin was administered after a JR4 catheter was advanced into the central aorta. A  Swan-Ganz catheter was used for the right heart catheterization. Standard protocol was followed for recording of right heart pressures and sampling of oxygen saturations. Fick cardiac output was calculated. Standard Judkins catheters were used for selective coronary angiography. LV pressure is recorded and an aortic valve pullback is performed.  PCI of the right coronary artery was performed after the diagnostic procedure.  Additional heparin is administered.  A therapeutic ACT is achieved.  See PCI portion of the note for details.  There were no immediate procedural complications. The patient was transferred to the post catheterization recovery area for further monitoring.    Estimated blood loss <50 mL.   During this procedure medications were administered to achieve and maintain moderate conscious sedation while the patient's heart rate, blood pressure, and oxygen saturation were continuously monitored and I was present face-to-face 100% of this time.   Medications (Filter: Administrations occurring from 1054 to 1216 on 08/06/20) (important) Continuous medications are totaled by the amount administered until 08/06/20 1216.    Heparin (Porcine) in NaCl 1000-0.9  UT/500ML-% SOLN (mL) Total volume:  500 mL  Date/Time Rate/Dose/Volume Action   08/06/20 1056 500 mL Given    Heparin (Porcine) in NaCl 1000-0.9 UT/500ML-% SOLN (mL) Total volume:  500 mL  Date/Time Rate/Dose/Volume Action   08/06/20 1056 500 mL Given    fentaNYL (SUBLIMAZE) injection (mcg) Total dose:  50 mcg  Date/Time Rate/Dose/Volume Action   08/06/20 1103 25 mcg Given   1146 25 mcg Given    midazolam (VERSED) injection (mg) Total dose:  1 mg  Date/Time Rate/Dose/Volume Action   08/06/20 1103 0.5 mg Given   1147 0.5 mg Given    lidocaine (PF) (XYLOCAINE) 1 % injection (mL) Total volume:  4 mL  Date/Time Rate/Dose/Volume Action   08/06/20 1111 2 mL Given   1113 2 mL Given    Radial Cocktail/Verapamil only  (mL) Total volume:  10 mL  Date/Time Rate/Dose/Volume Action   08/06/20 1113 10 mL Given    heparin sodium (porcine) injection (Units) Total dose:  10,000 Units  Date/Time Rate/Dose/Volume Action   08/06/20 1130 4,000 Units Given   1142 3,000 Units Given   1155 3,000 Units Given    clopidogrel (PLAVIX) tablet (mg) Total dose:  75 mg  Date/Time Rate/Dose/Volume Action   08/06/20 1147 75 mg Given    iohexol (OMNIPAQUE) 350 MG/ML injection (mL) Total volume:  70 mL  Date/Time Rate/Dose/Volume Action   08/06/20 1211 70 mL Given    Sedation Time  Sedation Time Physician-1: 1 hour 3 minutes 14 seconds   Contrast  Medication Name Total Dose  iohexol (OMNIPAQUE) 350 MG/ML injection 70 mL    Radiation/Fluoro  Fluoro time: 10.7 (min) DAP: 20093 (mGycm2) Cumulative Air Kerma: 357 (mGy)   Coronary Findings   Diagnostic Dominance: Right  Left Main  Ost LM to Mid LM lesion is 30% stenosed. The lesion is calcified. Mild diffuse nonobstructive left main plaquing.  Left Anterior Descending  There is mild diffuse disease throughout the vessel.  Mid LAD lesion is 40% stenosed. The lesion is calcified.  Ramus Intermedius  Ramus lesion is 80% stenosed. The lesion is calcified. Lung diffuse lesion in the proximal intermediate branch, medium caliber vessel.  Left Circumflex  Vessel was injected. Vessel is normal in caliber. There is mild diffuse disease throughout the vessel. The left circumflex has mild diffuse nonobstructive plaquing  Right Coronary Artery  Ost RCA to Prox RCA lesion is 20% stenosed. The lesion was previously treated using a drug eluting stent over 2 years ago.  Prox RCA to Mid RCA lesion is 80% stenosed. The lesion is eccentric. The lesion is mildly calcified.  Mid RCA to Dist RCA lesion is 10% stenosed. The lesion was previously treated using a drug eluting stent over 2 years ago.   Intervention   Prox RCA to Mid RCA lesion  Stent  Pre-stent  angioplasty was performed using a BALLOON SAPPHIRE 3.0X20. A drug-eluting stent was successfully placed using a STENT RESOLUTE ONYX 3.5X30. Post-stent angioplasty was performed using a BALLOON SAPPHIRE [place] [id number].  Post-Intervention Lesion Assessment  The intervention was successful. Pre-interventional TIMI flow is 3. Post-intervention TIMI flow is 3. No complications occurred at this lesion.  There is a 0% residual stenosis post intervention.   Right Heart  Right Heart Pressures Hemodynamic findings consistent with mild pulmonary hypertension. LV EDP is normal. Large V waves, 45 mmHg, suggestive of severe mitral regurgitation   Coronary Diagrams   Diagnostic Dominance: Right    Intervention     Implants  Permanent Stent   Stent Resolute Onyx 3.5x30 - [id number] - Implanted  Inventory item: Leonie Douglas ONYX 3.5X30 Model/Cat number: [id number]  Manufacturer: [place] Lot number: [id number]  Device identifier: [id number] Device identifier type: GS1  Area Of Implantation: Mid RCA     GUDID Information  Request status Successful    Brand name: Resolute Onyx Version/Model: [id number]  Company name: [employer] MRI safety info as of 08/06/20: MR Conditional  Contains dry or latex rubber: No    GMDN P.T. name: Drug-eluting coronary artery stent, non-bioabsorbable-polymer-coated     As of 08/06/2020  Status: Implanted        Syngo Images  Show images for CARDIAC CATHETERIZATION  Images on Long Term Storage  Show images for Almir, Botts to Procedure Log  Procedure Log     Hemo Data  Flowsheet Row Most Recent Value  Fick Cardiac Output 6.29 L/min  Fick Cardiac Output Index 3.35 (L/min)/BSA  RA A Wave 5 mmHg  RA V Wave 4 mmHg  RA Mean 3 mmHg  RV Systolic Pressure 41 mmHg  RV Diastolic Pressure 1 mmHg  RV EDP 7 mmHg  PA Systolic Pressure 44 mmHg  PA Diastolic Pressure 14 mmHg  PA Mean 29 mmHg  PW A Wave 20  mmHg  PW V Wave 45 mmHg  PW Mean 24 mmHg  AO Systolic Pressure 258 mmHg  AO Diastolic Pressure 65 mmHg  AO Mean 96 mmHg  LV Systolic Pressure 527 mmHg  LV Diastolic Pressure 6 mmHg  LV EDP 16 mmHg  AOp Systolic Pressure 782 mmHg  AOp Diastolic Pressure 65 mmHg  AOp Mean Pressure 95 mmHg  LVp Systolic Pressure 423 mmHg  LVp Diastolic Pressure 8 mmHg  LVp EDP Pressure 18 mmHg  QP/QS 1  TPVR Index 8.64 HRUI  TSVR Index 29.2 HRUI  PVR SVR Ratio 0.05  TPVR/TSVR Ratio 0.3      Impression:  Patient has mitral valve prolapse with stage D severe symptomatic primary mitral regurgitation.  He describes a several month history of worsening symptoms of exertional shortness of breath and fatigue consistent with chronic diastolic congestive heart failure, New York Heart Association functional class III.  He recently had a brief episode of resting shortness of breath which prompted him to call EMS although he never required hospitalization.  He has not had any symptoms of chest tightness or chest pressure to suggest concurrent angina pectoris.  I personally reviewed the patient's recent echocardiograms and diagnostic cardiac catheterization.  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  The left atrium is dilated.  There is low normal left ventricular systolic function.  Diagnostic cardiac catheterization revealed mildly elevated pulmonary artery pressures with large V waves consistent with severe mitral regurgitation.  The patient did have high-grade stenosis of the mid right coronary artery but this was successfully treated with PCI and stenting.  The patient otherwise has nonobstructive disease with exception of a medium sized intermediate  branch that would best be treated without revascularization.  I agree the patient needs mitral valve repair.  Risks associated with conventional surgery would be elevated because of the patient's advanced age and numerous medical problems.  Although the patient would probably survive elective surgery, his postoperative recovery might be quite prolonged and could lead to  prolonged and possible permanent changes in his functional capacity and quality of life.   Plan:  The patient and his daughter were counseled at length regarding treatment alternatives for management of severe symptomatic mitral regurgitation.  Alternative approaches such as conventional surgical mitral valve repair or replacement with or without minimally-invasive techniques, percutaneous edge-to-edge Mitraclip repair, and continued medical therapy without intervention were compared and contrasted at length.  The risks associated with conventional surgery were discussed in detail, as were expectations for post-operative convalescence, and why I would be reluctant to consider this patient a candidate for conventional surgery.  Issues specific to Mitraclip repair were discussed including questions about long term freedom from persistent or recurrent mitral regurgitation, the potential for device migration or embolization, and other technical complications related to the procedure itself.  Current availability of other catheter-based treatments for mitral regurgitation was discussed.  Long-term prognosis with medical therapy was discussed. This discussion was placed in the context of the patient's own specific clinical presentation and past medical history.  All of their questions have been addressed.  The patient is interested in proceeding with transcatheter edge-to-edge repair in the near future.  He will call and return to our office in the future only as needed.    I spent in excess of 90 minutes during the conduct of this office  consultation and >50% of this time involved direct face-to-face encounter with the patient for counseling and/or coordination of their care.      Valentina Gu. Roxy Manns, MD 08/13/2020 9:40 AM

## 2020-08-17 ENCOUNTER — Encounter (HOSPITAL_COMMUNITY): Payer: Self-pay

## 2020-08-17 ENCOUNTER — Encounter: Payer: Self-pay | Admitting: Physical Therapy

## 2020-08-17 ENCOUNTER — Ambulatory Visit (HOSPITAL_COMMUNITY)
Admission: RE | Admit: 2020-08-17 | Discharge: 2020-08-17 | Disposition: A | Discharge status: Home / Self Care | Payer: Medicare Other | Source: Ambulatory Visit | Attending: Cardiovascular Disease | Admitting: Cardiovascular Disease

## 2020-08-17 ENCOUNTER — Other Ambulatory Visit (HOSPITAL_COMMUNITY)
Admission: RE | Admit: 2020-08-17 | Discharge: 2020-08-17 | Disposition: A | Discharge status: Home / Self Care | Payer: Medicare Other | Source: Ambulatory Visit | Attending: Cardiovascular Disease | Admitting: Cardiovascular Disease

## 2020-08-17 ENCOUNTER — Ambulatory Visit: Payer: Medicare Other | Attending: Cardiovascular Disease | Admitting: Physical Therapy

## 2020-08-17 ENCOUNTER — Other Ambulatory Visit: Payer: Self-pay

## 2020-08-17 ENCOUNTER — Encounter (HOSPITAL_COMMUNITY)
Admission: RE | Admit: 2020-08-17 | Discharge: 2020-08-17 | Disposition: A | Discharge status: Home / Self Care | Payer: Medicare Other | Source: Ambulatory Visit | Attending: Cardiovascular Disease | Admitting: Cardiovascular Disease

## 2020-08-17 DIAGNOSIS — E785 Hyperlipidemia, unspecified: Secondary | ICD-10-CM | POA: Diagnosis not present

## 2020-08-17 DIAGNOSIS — I252 Old myocardial infarction: Secondary | ICD-10-CM | POA: Diagnosis not present

## 2020-08-17 DIAGNOSIS — Z87891 Personal history of nicotine dependence: Secondary | ICD-10-CM | POA: Diagnosis not present

## 2020-08-17 DIAGNOSIS — M6281 Muscle weakness (generalized): Secondary | ICD-10-CM | POA: Diagnosis not present

## 2020-08-17 DIAGNOSIS — I081 Rheumatic disorders of both mitral and tricuspid valves: Secondary | ICD-10-CM | POA: Diagnosis not present

## 2020-08-17 DIAGNOSIS — Z7982 Long term (current) use of aspirin: Secondary | ICD-10-CM | POA: Diagnosis not present

## 2020-08-17 DIAGNOSIS — Z01812 Encounter for preprocedural laboratory examination: Secondary | ICD-10-CM | POA: Insufficient documentation

## 2020-08-17 DIAGNOSIS — Z8546 Personal history of malignant neoplasm of prostate: Secondary | ICD-10-CM | POA: Diagnosis not present

## 2020-08-17 DIAGNOSIS — Z7902 Long term (current) use of antithrombotics/antiplatelets: Secondary | ICD-10-CM | POA: Diagnosis not present

## 2020-08-17 DIAGNOSIS — Z006 Encounter for examination for normal comparison and control in clinical research program: Secondary | ICD-10-CM | POA: Diagnosis not present

## 2020-08-17 DIAGNOSIS — Z01818 Encounter for other preprocedural examination: Secondary | ICD-10-CM | POA: Insufficient documentation

## 2020-08-17 DIAGNOSIS — R2689 Other abnormalities of gait and mobility: Secondary | ICD-10-CM | POA: Insufficient documentation

## 2020-08-17 DIAGNOSIS — I340 Nonrheumatic mitral (valve) insufficiency: Secondary | ICD-10-CM

## 2020-08-17 DIAGNOSIS — Z20822 Contact with and (suspected) exposure to covid-19: Secondary | ICD-10-CM | POA: Insufficient documentation

## 2020-08-17 DIAGNOSIS — I341 Nonrheumatic mitral (valve) prolapse: Secondary | ICD-10-CM | POA: Diagnosis not present

## 2020-08-17 DIAGNOSIS — Z9079 Acquired absence of other genital organ(s): Secondary | ICD-10-CM | POA: Diagnosis not present

## 2020-08-17 DIAGNOSIS — E7800 Pure hypercholesterolemia, unspecified: Secondary | ICD-10-CM | POA: Diagnosis not present

## 2020-08-17 DIAGNOSIS — I10 Essential (primary) hypertension: Secondary | ICD-10-CM | POA: Diagnosis not present

## 2020-08-17 DIAGNOSIS — I2510 Atherosclerotic heart disease of native coronary artery without angina pectoris: Secondary | ICD-10-CM | POA: Diagnosis not present

## 2020-08-17 DIAGNOSIS — I25119 Atherosclerotic heart disease of native coronary artery with unspecified angina pectoris: Secondary | ICD-10-CM | POA: Diagnosis not present

## 2020-08-17 DIAGNOSIS — I700 Atherosclerosis of aorta: Secondary | ICD-10-CM | POA: Insufficient documentation

## 2020-08-17 DIAGNOSIS — Z954 Presence of other heart-valve replacement: Secondary | ICD-10-CM | POA: Diagnosis not present

## 2020-08-17 DIAGNOSIS — Z79899 Other long term (current) drug therapy: Secondary | ICD-10-CM | POA: Diagnosis not present

## 2020-08-17 DIAGNOSIS — I739 Peripheral vascular disease, unspecified: Secondary | ICD-10-CM | POA: Diagnosis not present

## 2020-08-17 DIAGNOSIS — Z955 Presence of coronary angioplasty implant and graft: Secondary | ICD-10-CM | POA: Insufficient documentation

## 2020-08-17 DIAGNOSIS — J45909 Unspecified asthma, uncomplicated: Secondary | ICD-10-CM | POA: Diagnosis not present

## 2020-08-17 DIAGNOSIS — I493 Ventricular premature depolarization: Secondary | ICD-10-CM | POA: Diagnosis not present

## 2020-08-17 HISTORY — DX: Dyspnea, unspecified: R06.00

## 2020-08-17 HISTORY — DX: Acute myocardial infarction, unspecified: I21.9

## 2020-08-17 HISTORY — DX: Personal history of urinary calculi: Z87.442

## 2020-08-17 HISTORY — DX: Peripheral vascular disease, unspecified: I73.9

## 2020-08-17 HISTORY — DX: Cardiac murmur, unspecified: R01.1

## 2020-08-17 LAB — COMPREHENSIVE METABOLIC PANEL
ALT: 11 U/L (ref 0–44)
AST: 17 U/L (ref 15–41)
Albumin: 3.4 g/dL — ABNORMAL LOW (ref 3.5–5.0)
Alkaline Phosphatase: 58 U/L (ref 38–126)
Anion gap: 10 (ref 5–15)
BUN: 24 mg/dL — ABNORMAL HIGH (ref 8–23)
CO2: 21 mmol/L — ABNORMAL LOW (ref 22–32)
Calcium: 9.4 mg/dL (ref 8.9–10.3)
Chloride: 108 mmol/L (ref 98–111)
Creatinine, Ser: 1.75 mg/dL — ABNORMAL HIGH (ref 0.61–1.24)
GFR, Estimated: 37 mL/min — ABNORMAL LOW (ref >60)
Glucose, Bld: 186 mg/dL — ABNORMAL HIGH (ref 70–99)
Potassium: 3.8 mmol/L (ref 3.5–5.1)
Sodium: 139 mmol/L (ref 135–145)
Total Bilirubin: 0.8 mg/dL (ref 0.3–1.2)
Total Protein: 6.2 g/dL — ABNORMAL LOW (ref 6.5–8.1)

## 2020-08-17 LAB — BLOOD GAS, ARTERIAL
Acid-Base Excess: 0.8 mmol/L (ref 0.0–2.0)
Bicarbonate: 24.5 mmol/L (ref 20.0–28.0)
FIO2: 21
O2 Saturation: 97.5 %
Patient temperature: 37
pCO2 arterial: 36.9 mmHg (ref 32.0–48.0)
pH, Arterial: 7.438 (ref 7.350–7.450)
pO2, Arterial: 90.7 mmHg (ref 83.0–108.0)

## 2020-08-17 LAB — CBC
HCT: 33.6 % — ABNORMAL LOW (ref 39.0–52.0)
Hemoglobin: 10.9 g/dL — ABNORMAL LOW (ref 13.0–17.0)
MCH: 30.9 pg (ref 26.0–34.0)
MCHC: 32.4 g/dL (ref 30.0–36.0)
MCV: 95.2 fL (ref 80.0–100.0)
Platelets: 237 10*3/uL (ref 150–400)
RBC: 3.53 MIL/uL — ABNORMAL LOW (ref 4.22–5.81)
RDW: 13.1 % (ref 11.5–15.5)
WBC: 6.8 10*3/uL (ref 4.0–10.5)
nRBC: 0 % (ref 0.0–0.2)

## 2020-08-17 LAB — URINALYSIS, ROUTINE W REFLEX MICROSCOPIC
Bilirubin Urine: NEGATIVE
Glucose, UA: NEGATIVE mg/dL
Hgb urine dipstick: NEGATIVE
Ketones, ur: NEGATIVE mg/dL
Leukocytes,Ua: NEGATIVE
Nitrite: NEGATIVE
Protein, ur: NEGATIVE mg/dL
Specific Gravity, Urine: 1.012 (ref 1.005–1.030)
pH: 5 (ref 5.0–8.0)

## 2020-08-17 LAB — SURGICAL PCR SCREEN
MRSA, PCR: NEGATIVE
Staphylococcus aureus: POSITIVE — AB

## 2020-08-17 LAB — PROTIME-INR
INR: 1.1 (ref 0.8–1.2)
Prothrombin Time: 13.3 seconds (ref 11.4–15.2)

## 2020-08-17 LAB — TYPE AND SCREEN
ABO/RH(D): O POS
Antibody Screen: NEGATIVE

## 2020-08-17 LAB — SARS CORONAVIRUS 2 (TAT 6-24 HRS): SARS Coronavirus 2: NEGATIVE

## 2020-08-17 NOTE — Therapy (Signed)
Adam Arellano, [place], [address] Phone: [phone]   Fax:  [phone]  Physical Therapy Evaluation  Patient Details  Name: Adam Arellano MRN: [id number] Date of Birth: [birth year] Referring Provider (PT): Sherren Mocha, MD   Encounter Date: 08/17/2020   PT End of Session - 08/17/20 1501    Visit Number 1    Number of Visits 1    Date for PT Re-Evaluation 08/18/20    PT Start Time 1500    PT Stop Time 1530    PT Time Calculation (min) 30 min    Activity Tolerance Patient tolerated treatment well    Behavior During Therapy [facility] for tasks assessed/performed           Past Medical History:  Diagnosis Date  . Asthma    a. Childhood.  . Coronary artery disease    a. NSTEMI s/p DES to dRCA 2007. b. Angina 07/2009: s/p DESx2 to mid RCA for ISR and prox RCA, EF 65%. c. Stress test 05/2013: low risk, no evidence of ischemia, EF 61%.  . Dyslipidemia   . Dyspnea   . GERD (gastroesophageal reflux disease)   . Heart murmur   . History of kidney stones   . Hypertension   . Myocardial infarction ([place])    over 20 years ago  . Peripheral vascular disease ([place])   . Prostate cancer [facility])    a. s/p radical prostatectomy.  [name] PVC's (premature ventricular contractions)   . Transient global amnesia    a. Adm 2009: imaging nonacute, had resolved.    Past Surgical History:  Procedure Laterality Date  . CARDIAC CATHETERIZATION    . CORONARY STENT INTERVENTION N/A 08/06/2020   Procedure: CORONARY STENT INTERVENTION;  Surgeon: Sherren Mocha, MD;  Location: [facility] CV LAB;  Service: Cardiovascular;  Laterality: N/A;  . MASTOIDECTOMY     L ear  . PROSTATECTOMY    . RIGHT/LEFT HEART CATH AND CORONARY ANGIOGRAPHY N/A 08/06/2020   Procedure: RIGHT/LEFT HEART CATH AND CORONARY ANGIOGRAPHY;  Surgeon: Sherren Mocha, MD;  Location: [facility] CV LAB;  Service: Cardiovascular;  Laterality: N/A;  . TEE WITHOUT  CARDIOVERSION N/A 08/06/2020   Procedure: TRANSESOPHAGEAL ECHOCARDIOGRAM (TEE);  Surgeon: Sanda Klein, MD;  Location: [facility] ENDOSCOPY;  Service: Cardiovascular;  Laterality: N/A;    There were no vitals filed for this visit.    Subjective Assessment - 08/17/20 1506    Subjective pt is 87 y.o M with CC of SOB and dyspnea with exertion starting 2 months ago with progressive worsening.  He denies any other issues / limitations, but reports issues with the R shoulder which he had been seen for PT previously.    Patient Stated Goals to fix the heart    Currently in Pain? No/denies              [name] PT Assessment - 08/17/20 1507      Assessment   Medical Diagnosis Mitraclip    Referring Provider (PT) Sherren Mocha, MD    Onset Date/Surgical Date --   2 months   Hand Dominance Right      Precautions   Precautions None      Restrictions   Weight Bearing Restrictions No      Balance Screen   Has the patient fallen in the past 6 months No      [place] residence    Living Arrangements Alone    Type of Home  House    Home Access Level entry    Home Layout --   12 steps to basement     Prior Function   Level of Independence Independent with basic ADLs    Vocation Retired      Associate Professor   Overall Cognitive Status Within Functional Limits for tasks assessed      ROM / Strength   AROM / PROM / Strength AROM;Strength      AROM   Overall AROM Comments limited R shoulder flexion/ ir/ER with report of pain during assessment      Strength   Overall Strength Within functional limits for tasks performed    Overall Strength Comments limited gross R shoulder strength    Right/Left hand Right;Left    Right Hand Grip (lbs) 61    Left Hand Grip (lbs) 61      Ambulation/Gait   Gait Pattern Step-through pattern;Decreased stride length;Trendelenburg;Antalgic;Wide base of support    Gait Comments pt ambulated 740 ft in 4:42 seconds requring seated  rest break remainder of test HR 102, O2 97%            [facility] Pre-Surgical Assessment - 08/17/20 0001    5 Meter Walk Test- trial 1 5 sec    5 Meter Walk Test- trial 2 4 sec.     5 Meter Walk Test- trial 3 4 sec.    5 meter walk test average 4.33 sec    4 Stage Balance Test tolerated for:  3 sec.    4 Stage Balance Test Position 4    Sit To Stand Test- trial 1 11 sec.    ADL/IADL Independent with: Bathing;Dressing;Meal prep;Finances;Yard work    ADL/IADL Therapist, sports Index Vulnerable    6 Minute Walk- Baseline yes    BP (mmHg) 127/63    HR (bpm) 76    02 Sat (%RA) 97 %    Modified Borg Scale for Dyspnea 0- Nothing at all    Perceived Rate of Exertion (Borg) 11- Fairly light    6 Minute Walk Post Test yes    BP (mmHg) 129/72    HR (bpm) 102    02 Sat (%RA) 97 %    Modified Borg Scale for Dyspnea 2- Mild shortness of breath    Perceived Rate of Exertion (Borg) 12-    Aerobic Endurance Distance Walked 740    Endurance additional comments pt is 45.91% limited compared to age related norm                    Objective measurements completed on examination: See above findings.                            Plan - 08/17/20 1503    Clinical Impression Statement See assessment in note    Stability/Clinical Decision Making Stable/Uncomplicated    Clinical Decision Making Low    PT Frequency One time visit    PT Next Visit Plan Pre Mitraclip evaluation    Consulted and Agree with Plan of Care Patient           Clinical Impression Statement: Pt is a 87 yo M presenting to OP PT for evaluation prior to possible Mitraclip surgery due to severe mitral regurgitation. Pt reports onset of SOB and dyspnea with exertion approximately 2 months ago. Symptoms are limiting walking/ standing endurance. Pt presents with functional  ROM and strength except for R shoulder, good balance and is  assess as low at high fall risk 4 stage balance test, good walking speed and  limited aerobic endurance per 6 minute walk test. Pt ambulated pt ambulated 740 ft in 4:42 seconds requiring seated rest break for the  remainder of test HR 102, O2 97% on room air. Pt reported 2/10 shortness of breath on modified scale for dyspnea.  Pt ambulated a total of 740 feet in 6 minute walk. SOB, fatigue, hip soreness increased significantly with 6 minute walk test. Based on the Short Physical Performance Battery, patient has a frailty rating of 12/12 with </= 5/12 considered frail.     Patient demonstrated the following deficits and impairments:     Visit Diagnosis: Other abnormalities of gait and mobility  Muscle weakness (generalized)     Problem List Patient Active Problem List   Diagnosis Date Noted  . Nonrheumatic mitral valve regurgitation   . CAD S/P percutaneous coronary angioplasty 08/10/2014  . Atherosclerosis of native coronary artery of native heart without angina pectoris 02/27/2014  . Old MI (myocardial infarction) 02/27/2014  . Essential hypertension 02/27/2014  . Pure hypercholesterolemia 02/27/2014  . Atypical angina ([place]) 06/10/2013  . Hyperlipidemia 06/10/2013  . Old myocardial infarction 06/10/2013   Starr Lake PT, DPT, LAT, ATC  08/17/20  3:34 PM      [facility] [address] [place], [place], [address] Phone: [phone]   Fax:  [phone]  Name: SANTOSH PETTER MRN: [id number] Date of Birth: 05/07/1932

## 2020-08-17 NOTE — Progress Notes (Signed)
Surgical Instructions    Your procedure is scheduled on Thursday, 08/19/20.  Report to [facility] Main Entrance "A" at 10:15 A.M., then check in with the Admitting office.  Call this number if you have problems the morning of surgery:  [phone]   If you have any questions prior to your surgery date call [phone]: Open Monday-Friday 8am-4pm    Remember:  Do not eat or drink after midnight the night before your surgery      Take these medicines the morning of surgery with A SIP OF WATER  metoprolol tartrate (LOPRESSOR)  nitroGLYCERIN (NITROSTAT) if needed Polyvinyl Alcohol-Povidone (REFRESH OP) eye drops rosuvastatin (CRESTOR)   As of today, STOP taking any Aspirin (unless otherwise instructed by your surgeon) Aleve, Naproxen, Ibuprofen, Motrin, Advil, Goody's, BC's, all herbal medications, fish oil, and all vitamins.                     Do not wear jewelry, make up, or nail polish            Do not wear lotions, powders, perfumes/colognes, or deodorant.            Men may shave face and neck.            Do not bring valuables to the hospital.            [facility] is not responsible for any belongings or valuables.  Do NOT Smoke (Tobacco/Vaping) or drink Alcohol 24 hours prior to your procedure If you use a CPAP at night, you may bring all equipment for your overnight stay.   Contacts, glasses, dentures or bridgework may not be worn into surgery, please bring cases for these belongings   For patients admitted to the hospital, discharge time will be determined by your treatment team.   Patients discharged the day of surgery will not be allowed to drive home, and someone needs to stay with them for 24 hours.    Special instructions:   [place]- Preparing For Surgery  Before surgery, you can play an important role. Because skin is not sterile, your skin needs to be as free of germs as possible. You can reduce the number of germs on your skin by washing with CHG  (chlorahexidine gluconate) Soap before surgery.  CHG is an antiseptic cleaner which kills germs and bonds with the skin to continue killing germs even after washing.    Oral Hygiene is also important to reduce your risk of infection.  Remember - BRUSH YOUR TEETH THE MORNING OF SURGERY WITH YOUR REGULAR TOOTHPASTE  Please do not use if you have an allergy to CHG or antibacterial soaps. If your skin becomes reddened/irritated stop using the CHG.  Do not shave (including legs and underarms) for at least 48 hours prior to first CHG shower. It is OK to shave your face.  Please follow these instructions carefully.   1. Shower the NIGHT BEFORE SURGERY and the MORNING OF SURGERY  2. If you chose to wash your hair, wash your hair first as usual with your normal shampoo.  3. After you shampoo, rinse your hair and body thoroughly to remove the shampoo.  4. Wash Face and genitals (private parts) with your normal soap.   5.  Shower the NIGHT BEFORE SURGERY and the MORNING OF SURGERY with CHG Soap.   6. Use CHG Soap as you would any other liquid soap. You can apply CHG directly to the skin and wash gently with a  scrungie or a clean washcloth.   7. Apply the CHG Soap to your body ONLY FROM THE NECK DOWN.  Do not use on open wounds or open sores. Avoid contact with your eyes, ears, mouth and genitals (private parts). Wash Face and genitals (private parts)  with your normal soap.   8. Wash thoroughly, paying special attention to the area where your surgery will be performed.  9. Thoroughly rinse your body with warm water from the neck down.  10. DO NOT shower/wash with your normal soap after using and rinsing off the CHG Soap.  11. Pat yourself dry with a CLEAN TOWEL.  12. Wear CLEAN PAJAMAS to bed the night before surgery  13. Place CLEAN SHEETS on your bed the night before your surgery  14. DO NOT SLEEP WITH PETS.   Day of Surgery: Take a shower.  Wear Clean/Comfortable clothing the morning  of surgery Do not apply any deodorants/lotions.   Remember to brush your teeth WITH YOUR REGULAR TOOTHPASTE.   Please read over the following fact sheets that you were given.

## 2020-08-17 NOTE — Progress Notes (Signed)
PCP - Hal Stoneking Cardiologist - Candee Furbish  PPM/ICD - denies   Chest x-ray - n/a EKG - 08/09/20 Stress Test - 05/07/13 ECHO - 08/06/20 Cardiac Cath -08/06/20   Sleep Study - denies   Patient instructed to hold all Aspirin, NSAID's, herbal medications, fish oil and vitamins 7 days prior to surgery. Advised patient to continue taking plavix and aspirin through the day before surgery. Do not take the day of surgery.   ERAS Protcol -no   COVID TEST- 08/17/20   Anesthesia review: yes, cardiac history.   Patient denies shortness of breath, fever, cough and chest pain at PAT appointment   All instructions explained to the patient, with a verbal understanding of the material. Patient agrees to go over the instructions while at home for a better understanding. Patient also instructed to self quarantine after being tested for COVID-19. The opportunity to ask questions was provided.

## 2020-08-19 ENCOUNTER — Encounter (HOSPITAL_COMMUNITY): Payer: Self-pay | Admitting: Cardiovascular Disease

## 2020-08-19 ENCOUNTER — Inpatient Hospital Stay (HOSPITAL_COMMUNITY)
Admission: RE | Admit: 2020-08-19 | Discharge: 2020-08-20 | DRG: 267 | Disposition: A | Discharge status: Home / Self Care | Payer: Medicare Other | Attending: Cardiovascular Disease | Admitting: Cardiovascular Disease

## 2020-08-19 ENCOUNTER — Encounter (HOSPITAL_COMMUNITY)
Admission: RE | Disposition: A | Discharge status: Home / Self Care | Payer: Self-pay | Source: Home / Self Care | Attending: Cardiovascular Disease

## 2020-08-19 ENCOUNTER — Inpatient Hospital Stay (HOSPITAL_COMMUNITY): Payer: Medicare Other | Admitting: Anesthesiology

## 2020-08-19 ENCOUNTER — Inpatient Hospital Stay (HOSPITAL_COMMUNITY)
Admission: RE | Admit: 2020-08-19 | Discharge: 2020-08-19 | Disposition: A | Discharge status: Home / Self Care | Payer: Medicare Other | Source: Ambulatory Visit | Attending: Cardiovascular Disease | Admitting: Cardiovascular Disease

## 2020-08-19 ENCOUNTER — Other Ambulatory Visit: Payer: Self-pay

## 2020-08-19 ENCOUNTER — Inpatient Hospital Stay (HOSPITAL_COMMUNITY): Payer: Medicare Other | Admitting: Physician Assistant

## 2020-08-19 DIAGNOSIS — I2510 Atherosclerotic heart disease of native coronary artery without angina pectoris: Secondary | ICD-10-CM | POA: Diagnosis present

## 2020-08-19 DIAGNOSIS — I252 Old myocardial infarction: Secondary | ICD-10-CM

## 2020-08-19 DIAGNOSIS — Z7902 Long term (current) use of antithrombotics/antiplatelets: Secondary | ICD-10-CM | POA: Diagnosis not present

## 2020-08-19 DIAGNOSIS — Z20822 Contact with and (suspected) exposure to covid-19: Secondary | ICD-10-CM | POA: Diagnosis present

## 2020-08-19 DIAGNOSIS — Z95818 Presence of other cardiac implants and grafts: Secondary | ICD-10-CM

## 2020-08-19 DIAGNOSIS — Z7982 Long term (current) use of aspirin: Secondary | ICD-10-CM | POA: Diagnosis not present

## 2020-08-19 DIAGNOSIS — E7800 Pure hypercholesterolemia, unspecified: Secondary | ICD-10-CM | POA: Diagnosis present

## 2020-08-19 DIAGNOSIS — Z79899 Other long term (current) drug therapy: Secondary | ICD-10-CM

## 2020-08-19 DIAGNOSIS — I493 Ventricular premature depolarization: Secondary | ICD-10-CM | POA: Diagnosis present

## 2020-08-19 DIAGNOSIS — Z8546 Personal history of malignant neoplasm of prostate: Secondary | ICD-10-CM

## 2020-08-19 DIAGNOSIS — Z87891 Personal history of nicotine dependence: Secondary | ICD-10-CM

## 2020-08-19 DIAGNOSIS — I340 Nonrheumatic mitral (valve) insufficiency: Secondary | ICD-10-CM

## 2020-08-19 DIAGNOSIS — E785 Hyperlipidemia, unspecified: Secondary | ICD-10-CM | POA: Diagnosis present

## 2020-08-19 DIAGNOSIS — J45909 Unspecified asthma, uncomplicated: Secondary | ICD-10-CM | POA: Diagnosis present

## 2020-08-19 DIAGNOSIS — Z955 Presence of coronary angioplasty implant and graft: Secondary | ICD-10-CM | POA: Diagnosis not present

## 2020-08-19 DIAGNOSIS — Z006 Encounter for examination for normal comparison and control in clinical research program: Secondary | ICD-10-CM | POA: Diagnosis not present

## 2020-08-19 DIAGNOSIS — I739 Peripheral vascular disease, unspecified: Secondary | ICD-10-CM | POA: Diagnosis present

## 2020-08-19 DIAGNOSIS — Z9861 Coronary angioplasty status: Secondary | ICD-10-CM

## 2020-08-19 DIAGNOSIS — I341 Nonrheumatic mitral (valve) prolapse: Secondary | ICD-10-CM | POA: Diagnosis present

## 2020-08-19 DIAGNOSIS — C61 Malignant neoplasm of prostate: Secondary | ICD-10-CM | POA: Diagnosis present

## 2020-08-19 DIAGNOSIS — Z9079 Acquired absence of other genital organ(s): Secondary | ICD-10-CM | POA: Diagnosis not present

## 2020-08-19 DIAGNOSIS — I10 Essential (primary) hypertension: Secondary | ICD-10-CM | POA: Diagnosis present

## 2020-08-19 DIAGNOSIS — Z9889 Other specified postprocedural states: Secondary | ICD-10-CM

## 2020-08-19 DIAGNOSIS — Z954 Presence of other heart-valve replacement: Secondary | ICD-10-CM | POA: Diagnosis not present

## 2020-08-19 HISTORY — PX: TEE WITHOUT CARDIOVERSION: SHX5443

## 2020-08-19 HISTORY — PX: MITRAL VALVE REPAIR: CATH118311

## 2020-08-19 HISTORY — DX: Other specified postprocedural states: Z98.890

## 2020-08-19 HISTORY — DX: Presence of other cardiac implants and grafts: Z95.818

## 2020-08-19 LAB — ECHO TEE
MV M vel: 4.55 m/s
MV Peak grad: 82.8 mmHg
Radius: 1.1 cm

## 2020-08-19 LAB — ABO/RH: ABO/RH(D): O POS

## 2020-08-19 SURGERY — MITRAL VALVE REPAIR
Anesthesia: General

## 2020-08-19 MED ORDER — HEPARIN (PORCINE) IN NACL 2000-0.9 UNIT/L-% IV SOLN
INTRAVENOUS | Status: AC
  Filled 2020-08-19: qty 1000

## 2020-08-19 MED ORDER — PHENYLEPHRINE HCL-NACL 10-0.9 MG/250ML-% IV SOLN
INTRAVENOUS | Status: DC | PRN
  Administered 2020-08-19: 25 ug/min via INTRAVENOUS

## 2020-08-19 MED ORDER — NITROGLYCERIN 0.4 MG SL SUBL: 0.4000 mg | SUBLINGUAL_TABLET | SUBLINGUAL | Status: DC | PRN

## 2020-08-19 MED ORDER — HYDRALAZINE HCL 20 MG/ML IJ SOLN
10.0000 mg | INTRAMUSCULAR | Status: DC | PRN
  Administered 2020-08-19: 10 mg via INTRAVENOUS

## 2020-08-19 MED ORDER — METOPROLOL TARTRATE 25 MG PO TABS
25.0000 mg | ORAL_TABLET | Freq: Two times a day (BID) | ORAL | Status: DC
  Administered 2020-08-19 – 2020-08-20 (×2): 25 mg via ORAL
  Filled 2020-08-19 (×2): qty 1

## 2020-08-19 MED ORDER — LIDOCAINE 2% (20 MG/ML) 5 ML SYRINGE
INTRAMUSCULAR | Status: DC | PRN
  Administered 2020-08-19: 80 mg via INTRAVENOUS

## 2020-08-19 MED ORDER — PROPOFOL 10 MG/ML IV BOLUS
INTRAVENOUS | Status: DC | PRN
  Administered 2020-08-19: 130 mg via INTRAVENOUS

## 2020-08-19 MED ORDER — ASPIRIN EC 81 MG PO TBEC
81.0000 mg | DELAYED_RELEASE_TABLET | Freq: Every day | ORAL | Status: DC
  Administered 2020-08-20: 81 mg via ORAL
  Filled 2020-08-19: qty 1

## 2020-08-19 MED ORDER — HYDRALAZINE HCL 20 MG/ML IJ SOLN
INTRAMUSCULAR | Status: AC
  Filled 2020-08-19: qty 1

## 2020-08-19 MED ORDER — ACETAMINOPHEN 500 MG PO TABS
1000.0000 mg | ORAL_TABLET | Freq: Once | ORAL | Status: AC
  Administered 2020-08-19: 1000 mg via ORAL
  Filled 2020-08-19: qty 2

## 2020-08-19 MED ORDER — HEPARIN (PORCINE) IN NACL 1000-0.9 UT/500ML-% IV SOLN
INTRAVENOUS | Status: DC | PRN
  Administered 2020-08-19: 500 mL

## 2020-08-19 MED ORDER — VANCOMYCIN HCL IN DEXTROSE 1-5 GM/200ML-% IV SOLN
1000.0000 mg | INTRAVENOUS | Status: AC
  Administered 2020-08-19: 1000 mg via INTRAVENOUS
  Filled 2020-08-19: qty 200

## 2020-08-19 MED ORDER — ONDANSETRON HCL 4 MG/2ML IJ SOLN: 4.0000 mg | Freq: Four times a day (QID) | INTRAMUSCULAR | Status: DC | PRN

## 2020-08-19 MED ORDER — HEPARIN SODIUM (PORCINE) 1000 UNIT/ML IJ SOLN
INTRAMUSCULAR | Status: AC
  Filled 2020-08-19: qty 2

## 2020-08-19 MED ORDER — SODIUM CHLORIDE 0.9 % IV SOLN
250.0000 mL | INTRAVENOUS | Status: DC | PRN
Start: 2020-08-19 — End: 2020-08-20

## 2020-08-19 MED ORDER — ROCURONIUM BROMIDE 10 MG/ML (PF) SYRINGE
PREFILLED_SYRINGE | INTRAVENOUS | Status: DC | PRN
  Administered 2020-08-19: 60 mg via INTRAVENOUS
  Administered 2020-08-19: 20 mg via INTRAVENOUS

## 2020-08-19 MED ORDER — CLOPIDOGREL BISULFATE 75 MG PO TABS
75.0000 mg | ORAL_TABLET | Freq: Every day | ORAL | Status: DC
  Administered 2020-08-20: 75 mg via ORAL
  Filled 2020-08-19: qty 1

## 2020-08-19 MED ORDER — CHLORHEXIDINE GLUCONATE 0.12 % MT SOLN
15.0000 mL | Freq: Once | OROMUCOSAL | Status: AC
  Administered 2020-08-19 – 2020-08-20 (×2): 15 mL via OROMUCOSAL
  Filled 2020-08-19: qty 15

## 2020-08-19 MED ORDER — ACETAMINOPHEN 325 MG PO TABS: 650.0000 mg | ORAL_TABLET | ORAL | Status: DC | PRN

## 2020-08-19 MED ORDER — CHLORHEXIDINE GLUCONATE 4 % EX LIQD: 60.0000 mL | Freq: Once | CUTANEOUS | Status: DC

## 2020-08-19 MED ORDER — PROTAMINE SULFATE 10 MG/ML IV SOLN
INTRAVENOUS | Status: DC | PRN
  Administered 2020-08-19: 30 mg via INTRAVENOUS

## 2020-08-19 MED ORDER — CHLORHEXIDINE GLUCONATE 4 % EX LIQD: 30.0000 mL | CUTANEOUS | Status: DC

## 2020-08-19 MED ORDER — SODIUM CHLORIDE 0.9 % IV SOLN
1.5000 g | INTRAVENOUS | Status: AC
  Administered 2020-08-19: 1.5 g via INTRAVENOUS
  Filled 2020-08-19: qty 1.5

## 2020-08-19 MED ORDER — PHENYLEPHRINE 40 MCG/ML (10ML) SYRINGE FOR IV PUSH (FOR BLOOD PRESSURE SUPPORT)
PREFILLED_SYRINGE | INTRAVENOUS | Status: DC | PRN
  Administered 2020-08-19: 40 ug via INTRAVENOUS

## 2020-08-19 MED ORDER — FENTANYL CITRATE (PF) 100 MCG/2ML IJ SOLN
INTRAMUSCULAR | Status: DC | PRN
  Administered 2020-08-19: 50 ug via INTRAVENOUS

## 2020-08-19 MED ORDER — DEXAMETHASONE SODIUM PHOSPHATE 10 MG/ML IJ SOLN
INTRAMUSCULAR | Status: DC | PRN
  Administered 2020-08-19: 5 mg via INTRAVENOUS

## 2020-08-19 MED ORDER — LACTATED RINGERS IV SOLN: INTRAVENOUS | Status: DC

## 2020-08-19 MED ORDER — ONDANSETRON HCL 4 MG/2ML IJ SOLN
INTRAMUSCULAR | Status: DC | PRN
  Administered 2020-08-19: 4 mg via INTRAVENOUS

## 2020-08-19 MED ORDER — FUROSEMIDE 20 MG PO TABS
20.0000 mg | ORAL_TABLET | Freq: Every day | ORAL | Status: DC
  Administered 2020-08-20: 20 mg via ORAL
  Filled 2020-08-19 (×2): qty 1

## 2020-08-19 MED ORDER — ROSUVASTATIN CALCIUM 5 MG PO TABS
10.0000 mg | ORAL_TABLET | Freq: Every day | ORAL | Status: DC
  Administered 2020-08-19 – 2020-08-20 (×2): 10 mg via ORAL
  Filled 2020-08-19 (×2): qty 2

## 2020-08-19 MED ORDER — CIPROFLOXACIN-DEXAMETHASONE 0.3-0.1 % OT SUSP
4.0000 [drp] | Freq: Two times a day (BID) | OTIC | Status: DC | PRN
  Filled 2020-08-19: qty 7.5

## 2020-08-19 MED ORDER — HEPARIN SODIUM (PORCINE) 1000 UNIT/ML IJ SOLN
INTRAMUSCULAR | Status: DC | PRN
  Administered 2020-08-19: 11000 [IU] via INTRAVENOUS

## 2020-08-19 MED ORDER — SODIUM CHLORIDE 0.9% FLUSH
3.0000 mL | Freq: Two times a day (BID) | INTRAVENOUS | Status: DC
  Administered 2020-08-19 – 2020-08-20 (×2): 3 mL via INTRAVENOUS

## 2020-08-19 MED ORDER — HEPARIN (PORCINE) IN NACL 2000-0.9 UNIT/L-% IV SOLN
INTRAVENOUS | Status: DC | PRN
  Administered 2020-08-19 (×3): 1000 mL

## 2020-08-19 MED ORDER — SODIUM CHLORIDE 0.9% FLUSH: 3.0000 mL | INTRAVENOUS | Status: DC | PRN

## 2020-08-19 MED ORDER — HEPARIN (PORCINE) IN NACL 1000-0.9 UT/500ML-% IV SOLN
INTRAVENOUS | Status: AC
  Filled 2020-08-19: qty 500

## 2020-08-19 MED ORDER — SODIUM CHLORIDE 0.9 % IV SOLN: INTRAVENOUS | Status: DC

## 2020-08-19 MED ORDER — SUGAMMADEX SODIUM 200 MG/2ML IV SOLN
INTRAVENOUS | Status: DC | PRN
  Administered 2020-08-19: 200 mg via INTRAVENOUS

## 2020-08-19 SURGICAL SUPPLY — 16 items
BAG SNAP BAND KOVER 36X36 (MISCELLANEOUS) ×3 IMPLANT
CATH MITRA STEERABLE GUIDE (CATHETERS) ×3 IMPLANT
CLIP MITRA G4 DELIVERY SYS XTW (Clip) ×3 IMPLANT
CLOSURE PERCLOSE PROSTYLE (VASCULAR PRODUCTS) ×6 IMPLANT
KIT DILATOR VASC 18G NDL (KITS) ×3 IMPLANT
KIT HEART LEFT (KITS) ×6 IMPLANT
KIT VERSACROSS LRG ACCESS (CATHETERS) ×3 IMPLANT
PACK CARDIAC CATHETERIZATION (CUSTOM PROCEDURE TRAY) ×3 IMPLANT
SHEATH PINNACLE 8F 10CM (SHEATH) ×3 IMPLANT
SHEATH PROBE COVER 6X72 (BAG) ×3 IMPLANT
STOPCOCK MORSE 400PSI 3WAY (MISCELLANEOUS) ×18 IMPLANT
SYSTEM MITRACLIP G4 (SYSTAGENIX WOUND MANAGEMENT) ×3 IMPLANT
TRANSDUCER W/STOPCOCK (MISCELLANEOUS) ×3 IMPLANT
TUBING ART PRESS 72  MALE/FEM (TUBING) ×3
TUBING ART PRESS 72 MALE/FEM (TUBING) ×1 IMPLANT
TUBING CIL FLEX 10 FLL-RA (TUBING) ×3 IMPLANT

## 2020-08-19 NOTE — Plan of Care (Signed)

## 2020-08-19 NOTE — Transfer of Care (Signed)
Immediate Anesthesia Transfer of Care Note  Patient: Adam Arellano  Procedure(s) Performed: MITRAL VALVE REPAIR (N/A ) TRANSESOPHAGEAL ECHOCARDIOGRAM (TEE) (N/A )  Patient Location: Cath Lab  Anesthesia Type:General  Level of Consciousness: awake, alert  and oriented  Airway & Oxygen Therapy: Patient Spontanous Breathing  Post-op Assessment: Report given to RN  Post vital signs: Reviewed and stable  Last Vitals:  Vitals Value Taken Time  BP    Temp    Pulse    Resp    SpO2      Last Pain:  Vitals:   08/19/20 1019  TempSrc:   PainSc: 0-No pain      Patients Stated Pain Goal: 3 (65/53/74 8270)  Complications: No complications documented.

## 2020-08-19 NOTE — Anesthesia Procedure Notes (Signed)
Arterial Line Insertion Start/End3/17/2022 10:20 AM, 08/19/2020 10:30 AM Performed by: Reece Agar, CRNA, CRNA  Patient location: Pre-op. Preanesthetic checklist: patient identified, IV checked, site marked, risks and benefits discussed, surgical consent, monitors and equipment checked, pre-op evaluation, timeout performed and anesthesia consent Lidocaine 1% used for infiltration Right, radial was placed Catheter size: 20 G Hand hygiene performed , maximum sterile barriers used  and Seldinger technique used Allen's test indicative of satisfactory collateral circulation Attempts: 1 Procedure performed without using ultrasound guided technique. Following insertion, dressing applied and Biopatch. Post procedure assessment: normal and unchanged  Patient tolerated the procedure well with no immediate complications.

## 2020-08-19 NOTE — Plan of Care (Signed)

## 2020-08-19 NOTE — Anesthesia Postprocedure Evaluation (Signed)
Anesthesia Post Note  Patient: [name]  Procedure(s) Performed: MITRAL VALVE REPAIR (N/A ) TRANSESOPHAGEAL ECHOCARDIOGRAM (TEE) (N/A )     Patient location during evaluation: PACU Anesthesia Type: General Level of consciousness: awake and alert Pain management: pain level controlled Vital Signs Assessment: post-procedure vital signs reviewed and stable Respiratory status: spontaneous breathing, nonlabored ventilation, respiratory function stable and patient connected to nasal cannula oxygen Cardiovascular status: blood pressure returned to baseline and stable Postop Assessment: no apparent nausea or vomiting Anesthetic complications: no   No complications documented.  Last Vitals:  Vitals:   08/19/20 1700 08/19/20 1730  BP: (!) 144/68 (!) 129/114  Pulse: 76 82  Resp: 16 (!) 24  Temp:    SpO2: 100% 92%    Last Pain:  Vitals:   08/19/20 1730  TempSrc:   PainSc: 0-No pain                 Adam Arellano

## 2020-08-19 NOTE — Discharge Instructions (Signed)
Home Care Following Your MitraClip Procedure      If you have any questions or concerns you can call the structural heart office at [phone] during normal business hours 8am-4pm. If you have an urgent need after hours or on the weekend, please call [phone] to talk to the on call provider for general cardiology. If you have an emergency that requires immediate attention, please call 911.   Groin Site Care Refer to this sheet in the next few weeks. These instructions provide you with information on caring for yourself after your procedure. Your caregiver may also give you more specific instructions. Your treatment has been planned according to current medical practices, but problems sometimes occur. Call your caregiver if you have any problems or questions after your procedure. HOME CARE INSTRUCTIONS  You may shower 24 hours after the procedure. Remove the bandage (dressing) and gently wash the site with plain soap and water. Gently pat the site dry.   Do not apply powder or lotion to the site.   Do not sit in a bathtub, swimming pool, or whirlpool for 5 to 7 days.   No bending, squatting, or lifting anything over 10 pounds (4.5 kg) as directed by your caregiver.   Inspect the site at least twice daily.   Do not drive home if you are discharged the same day of the procedure. Have someone else drive you.   You may drive 72 hours after the procedure unless otherwise instructed by your caregiver.  What to expect:  Any bruising will usually fade within 1 to 2 weeks.   Blood that collects in the tissue (hematoma) may be painful to the touch. It should usually decrease in size and tenderness within 1 to 2 weeks.  SEEK IMMEDIATE MEDICAL CARE IF:  You have unusual pain at the groin site or down the affected leg.   You have redness, warmth, swelling, or pain at the groin site.   You have drainage (other than a small amount of blood on the dressing).   You have chills.   You have a  fever or persistent symptoms for more than 72 hours.   You have a fever and your symptoms suddenly get worse.   Your leg becomes pale, cool, tingly, or numb.   You have bleeding from the site. Hold pressure on the site until it subsides.    After MitraClip Checklist  Check  Test Description   Follow up appointment in 1-2 weeks  Most of our patients will see our structural heart physician assistant, Katie Fish, or your primary cardiologist within 1-2 weeks. Your incision site will be checked and you will be cleared to resume all normal activities if you are doing well.     1 month echo and follow up  You will have an echo to check on your heart valve clip and be seen back in the office by Katie Haub PA-C.   Follow up with your primary cardiologist You will need to be seen by your primary cardiologist in the following 3-6 months after your 1 month appointment in the valve clinic. Often times your Plavix or Aspirin will be discontinued during this time, but this is decided on a case by case basis.    1 year echo and follow up You will have another echo to check on your heart valve after one year and be seen back in the office by Katie Isham. This your last structural heart visit.   Bacterial endocarditis prophylaxis  You will   have to take antibiotics for the rest of your life before all dental procedures (even dental cleanings) to protect your heart valve from potential infection. Antibiotics are also required before some surgeries. Please check with your cardiologist before scheduling any surgeries. Also, please make sure to tell us if you have a penicillin allergy as you will require an alternative antibiotic.    ______________  Your Implant Identification Card Following your procedure, you will receive an Implant Identification Card, which your doctor will fill out and which you must carry with you at all times. Show your Implant Identification Card if you report to an emergency room.  This card identifies you as a patient who has had a MitraClip device implanted. If you require a magnetic resonance imaging (MRI) scan, tell your doctor or MRI technician that you have a MitraClip device implanted. Test results indicate that patients with the MitraClip device can safely undergo MRI scans under certain conditions described on the card.  

## 2020-08-19 NOTE — Anesthesia Preprocedure Evaluation (Signed)
Anesthesia Evaluation  Patient identified by MRN, date of birth, ID band Patient awake    Reviewed: Allergy & Precautions, NPO status , Patient's Chart, lab work & pertinent test results, reviewed documented beta blocker date and time   Airway Mallampati: II  TM Distance: >3 FB Neck ROM: Full    Dental  (+) Dental Advisory Given, Edentulous Lower, Edentulous Upper   Pulmonary asthma , former smoker,    Pulmonary exam normal breath sounds clear to auscultation       Cardiovascular hypertension, Pt. on home beta blockers + angina + CAD, + Past MI, + Cardiac Stents and + Peripheral Vascular Disease  + Valvular Problems/Murmurs MR  Rhythm:Regular Rate:Normal + Systolic murmurs    Neuro/Psych negative neurological ROS  negative psych ROS   GI/Hepatic Neg liver ROS, GERD  ,  Endo/Other  negative endocrine ROS  Renal/GU Renal InsufficiencyRenal disease   S/p prostatectomy     Musculoskeletal negative musculoskeletal ROS (+)   Abdominal   Peds  Hematology  (+) Blood dyscrasia (Plavix), anemia ,   Anesthesia Other Findings Day of surgery medications reviewed with the patient.  Reproductive/Obstetrics                             Anesthesia Physical Anesthesia Plan  ASA: IV  Anesthesia Plan: General   Post-op Pain Management:    Induction: Intravenous  PONV Risk Score and Plan: 2 and Dexamethasone and Ondansetron  Airway Management Planned: Oral ETT  Additional Equipment: Arterial line  Intra-op Plan:   Post-operative Plan: Extubation in OR  Informed Consent: I have reviewed the patients History and Physical, chart, labs and discussed the procedure including the risks, benefits and alternatives for the proposed anesthesia with the patient or authorized representative who has indicated his/her understanding and acceptance.     Dental advisory given  Plan Discussed with:  CRNA  Anesthesia Plan Comments:         Anesthesia Quick Evaluation

## 2020-08-19 NOTE — Progress Notes (Incomplete)
  Echocardiogram Echocardiogram Transesophageal  has been performed.  Adam Arellano M 08/19/2020, 12:49 PM

## 2020-08-19 NOTE — Progress Notes (Signed)
LOCATION:  Right RADIAL  A-line removed from right wrist/radial, manual pressure held for approximately 10 minutes, tolerated well  DRESSING APPLIED: gauze with tegaderm  SITE UPON ARRIVAL: LEVEL 0  SITE AFTER BAND REMOVAL: LEVEL 0  CIRCULATION SENSATION AND MOVEMENT: bilateral radial pulses at +2  COMMENTS:

## 2020-08-19 NOTE — Anesthesia Procedure Notes (Signed)
Procedure Name: Intubation Date/Time: 08/19/2020 10:58 AM Performed by: Barrington Ellison, CRNA Pre-anesthesia Checklist: Patient identified, Emergency Drugs available, Suction available and Patient being monitored Patient Re-evaluated:Patient Re-evaluated prior to induction Oxygen Delivery Method: Circle System Utilized Preoxygenation: Pre-oxygenation with 100% oxygen Induction Type: IV induction Ventilation: Mask ventilation without difficulty Laryngoscope Size: Mac and 4 Grade View: Grade I Tube type: Oral Tube size: 7.5 mm Number of attempts: 1 Airway Equipment and Method: Stylet and Oral airway Placement Confirmation: ETT inserted through vocal cords under direct vision,  positive ETCO2 and breath sounds checked- equal and bilateral Secured at: 22 cm Tube secured with: Tape Dental Injury: Teeth and Oropharynx as per pre-operative assessment

## 2020-08-19 NOTE — Interval H&P Note (Signed)
History and Physical Interval Note:  08/19/2020 10:07 AM  Adam Arellano  has presented today for surgery, with the diagnosis of Severe Mitral Insufficiency.  The various methods of treatment have been discussed with the patient and family. After consideration of risks, benefits and other options for treatment, the patient has consented to  Procedure(s): MITRAL VALVE REPAIR (N/A) TRANSESOPHAGEAL ECHOCARDIOGRAM (TEE) (N/A) as a surgical intervention.  The patient's history has been reviewed, patient examined, no change in status, stable for surgery.  I have reviewed the patient's chart and labs.  Questions were answered to the patient's satisfaction.     Sherren Mocha

## 2020-08-20 ENCOUNTER — Encounter (HOSPITAL_COMMUNITY): Payer: Self-pay | Admitting: Cardiovascular Disease

## 2020-08-20 ENCOUNTER — Other Ambulatory Visit: Payer: Self-pay | Admitting: Physician Assistant

## 2020-08-20 ENCOUNTER — Inpatient Hospital Stay (HOSPITAL_COMMUNITY): Payer: Medicare Other

## 2020-08-20 DIAGNOSIS — I340 Nonrheumatic mitral (valve) insufficiency: Secondary | ICD-10-CM

## 2020-08-20 DIAGNOSIS — I341 Nonrheumatic mitral (valve) prolapse: Secondary | ICD-10-CM | POA: Diagnosis not present

## 2020-08-20 DIAGNOSIS — Z95818 Presence of other cardiac implants and grafts: Secondary | ICD-10-CM

## 2020-08-20 DIAGNOSIS — Z20822 Contact with and (suspected) exposure to covid-19: Secondary | ICD-10-CM | POA: Diagnosis not present

## 2020-08-20 DIAGNOSIS — Z954 Presence of other heart-valve replacement: Secondary | ICD-10-CM

## 2020-08-20 DIAGNOSIS — Z006 Encounter for examination for normal comparison and control in clinical research program: Secondary | ICD-10-CM | POA: Diagnosis not present

## 2020-08-20 DIAGNOSIS — Z9889 Other specified postprocedural states: Secondary | ICD-10-CM

## 2020-08-20 LAB — ECHOCARDIOGRAM COMPLETE
Area-P 1/2: 2.43 cm2
Calc EF: 53.1 %
Height: 68 in
MV VTI: 2.36 cm2
S' Lateral: 3.5 cm
Single Plane A2C EF: 52.5 %
Single Plane A4C EF: 53.4 %
Weight: 2560 oz

## 2020-08-20 LAB — CBC
HCT: 31 % — ABNORMAL LOW (ref 39.0–52.0)
Hemoglobin: 10 g/dL — ABNORMAL LOW (ref 13.0–17.0)
MCH: 30.8 pg (ref 26.0–34.0)
MCHC: 32.3 g/dL (ref 30.0–36.0)
MCV: 95.4 fL (ref 80.0–100.0)
Platelets: 207 10*3/uL (ref 150–400)
RBC: 3.25 MIL/uL — ABNORMAL LOW (ref 4.22–5.81)
RDW: 12.8 % (ref 11.5–15.5)
WBC: 7.2 10*3/uL (ref 4.0–10.5)
nRBC: 0 % (ref 0.0–0.2)

## 2020-08-20 LAB — POCT ACTIVATED CLOTTING TIME
Activated Clotting Time: 244 seconds
Activated Clotting Time: 303 seconds

## 2020-08-20 LAB — BASIC METABOLIC PANEL
Anion gap: 10 (ref 5–15)
BUN: 22 mg/dL (ref 8–23)
CO2: 22 mmol/L (ref 22–32)
Calcium: 8.4 mg/dL — ABNORMAL LOW (ref 8.9–10.3)
Chloride: 108 mmol/L (ref 98–111)
Creatinine, Ser: 1.54 mg/dL — ABNORMAL HIGH (ref 0.61–1.24)
GFR, Estimated: 43 mL/min — ABNORMAL LOW (ref >60)
Glucose, Bld: 81 mg/dL (ref 70–99)
Potassium: 3.8 mmol/L (ref 3.5–5.1)
Sodium: 140 mmol/L (ref 135–145)

## 2020-08-20 NOTE — Progress Notes (Signed)
CARDIAC REHAB PHASE I   PRE:  Rate/Rhythm: 71 SR with PVC    BP: sitting 142/60    SaO2: 98 RA  MODE:  Ambulation: 400 ft   POST:  Rate/Rhythm: 92 SR    BP: sitting 145/66     SaO2: 98 RA  Tolerated very well, no c/o. Steady. Sts some SOB, maybe not as bad as PTA. To recliner. Discussed restrictions. Pt sts he gets enough exercise at home. [place], ACSM 08/20/2020 10:03 AM

## 2020-08-20 NOTE — Progress Notes (Addendum)
Provided patient with verbal discharge instructions. Daughter, [name] at bedside during discharge. RN answered all questions. Pt belonging sent with patient. VSS at discharged. IV removed. Pt discharged via wheelchair by RN to [employer] entrance to private vehicle.

## 2020-08-20 NOTE — Plan of Care (Signed)

## 2020-08-20 NOTE — Progress Notes (Signed)
  Echocardiogram 2D Echocardiogram has been performed.  Adam Arellano 08/20/2020, 10:03 AM

## 2020-08-20 NOTE — Discharge Summary (Addendum)
[place] VALVE TEAM  Discharge Summary    Patient ID: Adam Arellano MRN: [id number]; DOB: 01-04-1932  Admit date: 08/19/2020 Discharge date: 08/20/2020  Primary Care Provider: Lajean Manes, MD  Primary Cardiologist: Candee Furbish, MD   Discharge Diagnoses    Principal Problem:   S/P mitral valve clip implantation Active Problems:   Hyperlipidemia   Essential hypertension   Nonrheumatic mitral valve regurgitation   Prostate cancer ([place])   PVC's (premature ventricular contractions)   Peripheral vascular disease ([place])   Coronary artery disease   Allergies Allergies  Allergen Reactions  . Lisinopril Cough  . Simvastatin Other (See Comments)    MUSCLE ACHES REAL BAD    Diagnostic Studies/Procedures    08/19/2020 MITRAL VALVE REPAIR  Conclusion  Successful transcatheter edge-to-edge mitral valve repair using a MitraClip XTW device, reducing 4+ mitral regurgitation to 1+, clip positioned A2/P2 Recommendations  Antiplatelet/Anticoag Recommend uninterrupted dual antiplatelet therapy with Aspirin 81mg  daily and Clopidogrel 75mg  daily for 6 months. Recent PCI    _____________    Echo 08/19/2020: complete but pending formal read at the time of discharge  History of Present Illness     COURTNEY FENLON is a 87 y.o. male with a history of CAD with NSTEMI s/p multiple PCIs, HTN, HLD, PVCs, prostate CA s/p radical prostatectomy and severe MR who presented to [facility] on 08/19/2020 for planned TEER.  Patient's cardiac history dates back to 2007 when he presented with an acute non-ST segment elevation myocardial infarction.  He was treated with PCI and stenting of the distal right coronary artery at that time.  In 2011 he had recurrent symptoms of angina and underwent repeat PCI and stenting of the right coronary artery for in-stent restenosis.  He has done well from a cardiac standpoint since that time and has been followed intermittently  by Dr. Marlou Porch.  Approximately 2 to 3 months ago the patient began to develop symptoms of exertional shortness of breath and fatigue.  He was seen by his primary care physician and found to have a new prominent systolic murmur on physical exam.  An echocardiogram was performed demonstrating normal left ventricular systolic function with mitral valve prolapse and severe mitral regurgitation.  Patient was seen in follow-up by Dr. Marlou Porch and referred to Dr. Burt Knack for consultation.  Transesophageal echocardiogram performed August 06, 2020 confirmed the presence of mitral valve prolapse with a large flail segment involving the middle scallop of the posterior leaflet causing severe mitral regurgitation.  Left ventricular systolic function was mildly reduced with ejection fraction estimated 55 to 60%.  There was severe left atrial enlargement.  No other significant abnormalities were noted.  Diagnostic cardiac catheterization was performed and revealed severe stenosis of the mid right coronary artery which was successfully treated with PCI and stenting.  There was mild nonobstructive disease in the left anterior descending coronary artery and the left circumflex coronary artery with severe stenosis of a medium size intermediate branch.  Pulmonary artery pressures were mildly elevated with large V waves consistent with severe mitral regurgitation.    The patient has been evaluated by the multidisciplinary valve team and felt to have severe, symptomatic mitral regurgitation and to be a suitable candidate for TEER, which was set up for 08/19/2020.   Hospital Course     Consultants: none   Severe non rheumatic MR: s/p successful transcatheter edge-to-edge mitral valve repair using a MitraClip XTW device, reducing 4+ mitral regurgitation to 1+, clip positioned A2/P2.  Post op day1 echo completed but pending formal read. He was continued on DAPT with aspirin and Plavix given recent PCI. Plan for discharge today with close  follow up in the office next week.  CAD: s/p overlapping DES x2 to distal RCA on 08/06/20. Plan for continued DAPT for at least 6 months. Continue statin.   HTN: BP elevated today. Resumed on home meds. Continue to monitor. If BP remains elevated, will adjust medications.    _____________  Discharge Vitals Blood pressure (!) 151/62, pulse 65, temperature 98.6 F (37 C), temperature source Oral, resp. rate 19, height 5\' 8"  (1.727 m), weight 72.6 kg, SpO2 97 %.  Filed Weights   08/19/20 0935  Weight: 72.6 kg    GEN: Well nourished, well developed, in no acute distress HEENT: normal Neck: no JVD or masses Cardiac: RRR; 2/6 holosystolic murmur. No rubs, or gallops,no edema  Respiratory:  clear to auscultation bilaterally, normal work of breathing GI: soft, nontender, nondistended, + BS MS: no deformity or atrophy Skin: warm and dry, no rash.  Groin site clear without hematoma or ecchymosis  Neuro:  Alert and Oriented x 3, Strength and sensation are intact Psych: euthymic mood, full affect   Labs & Radiologic Studies    CBC Recent Labs    08/17/20 1330 08/20/20 0115  WBC 6.8 7.2  HGB 10.9* 10.0*  HCT 33.6* 31.0*  MCV 95.2 95.4  PLT 237 875   Basic Metabolic Panel Recent Labs    08/17/20 1330 08/20/20 0115  NA 139 140  K 3.8 3.8  CL 108 108  CO2 21* 22  GLUCOSE 186* 81  BUN 24* 22  CREATININE 1.75* 1.54*  CALCIUM 9.4 8.4*   Liver Function Tests Recent Labs    08/17/20 1330  AST 17  ALT 11  ALKPHOS 58  BILITOT 0.8  PROT 6.2*  ALBUMIN 3.4*   No results for input(s): LIPASE, AMYLASE in the last 72 hours. Cardiac Enzymes No results for input(s): CKTOTAL, CKMB, CKMBINDEX, TROPONINI in the last 72 hours. BNP Invalid input(s): POCBNP D-Dimer No results for input(s): DDIMER in the last 72 hours. Hemoglobin A1C No results for input(s): HGBA1C in the last 72 hours. Fasting Lipid Panel No results for input(s): CHOL, HDL, LDLCALC, TRIG, CHOLHDL, LDLDIRECT in  the last 72 hours. Thyroid Function Tests No results for input(s): TSH, T4TOTAL, T3FREE, THYROIDAB in the last 72 hours.  Invalid input(s): FREET3 _____________  DG Chest 2 View  Result Date: 08/18/2020 CLINICAL DATA:  Preop mitral valve surgery EXAM: CHEST - 2 VIEW COMPARISON:  05/16/2013 FINDINGS: Heart size mildly enlarged. Vascularity normal. Atherosclerotic calcification aortic arch. Right coronary stent. Lungs are clear without infiltrate or effusion. Apical pleural scarring bilaterally. IMPRESSION: No active cardiopulmonary disease. Electronically Signed   By: Franchot Gallo M.D.   On: 08/18/2020 10:49   CARDIAC CATHETERIZATION  Result Date: 08/19/2020 Successful transcatheter edge-to-edge mitral valve repair using a MitraClip XTW device, reducing 4+ mitral regurgitation to 1+, clip positioned A2/P2  CARDIAC CATHETERIZATION  Result Date: 08/06/2020 1.  Patent left mainstem with mild nonobstructive plaquing 2.  Patent LAD with mild diffuse calcific nonobstructive stenosis 3.  Patent circumflex with mild nonobstructive stenosis 4.  Severe stenosis of a moderate caliber ramus intermedius branch, favor medical therapy and an absence of angina 5.  Severe stenosis of a large, dominant RCA, successfully treated with PTCA and stenting using a 3.5 x 22 mm resolute Onyx DES deployed in overlapping fashion with a previously implanted distal RCA stent  6.  Normal LVEDP 7.  Hemodynamics consistent with severe mitral regurgitation with a pulmonary capillary wedge pressure V wave of 45 mmHg, mean wedge pressure 24 mmHg, mean PA pressure 29 mmHg Recommendations:  Aspirin and clopidogrel without interruption x6 months as tolerated  Cardiac surgical consultation for evaluation of severe mitral regurgitation, consideration of transcatheter edge-to-edge mitral valve repair in this elderly patient  Medical therapy for residual CAD  ECHO TEE  Result Date: 08/19/2020    TRANSESOPHOGEAL ECHO REPORT   Patient  Name:   JEWETT MCGANN Date of Exam: 08/19/2020 Medical Rec #:  [id number]         Height:       68.0 in Accession #:    [id number]        Weight:       162.9 lb Date of Birth:  1931-08-01          BSA:          1.873 m Patient Age:    87 years          BP:           130/60 mmHg Patient Gender: M                 HR:           64 bpm. Exam Location:  Inpatient Procedure: Transesophageal Echo, 3D Echo, Cardiac Doppler and Color Doppler Indications:    Severe mitral regurgitation [[id number]  History:        Patient has prior history of Echocardiogram examinations, most                 recent 08/06/2020. CAD and Previous Myocardial Infarction, PAD,                 Mitral Valve Disease, Arrythmias:PVC, Signs/Symptoms:Murmur;                 Risk Factors:Dyslipidemia. GERD.  Sonographer:    Darlina Sicilian RDCS Referring Phys: [phone] Tarvares Lant PROCEDURE: After discussion of the risks and benefits of a TEE, an informed consent was obtained from the patient. The patient was intubated and on an oscilating ventilator. The transesophogeal probe was passed without difficulty through the esophogus of  the patient. Imaged were obtained with the patient in a supine position. Sedation performed by different physician. The patient was monitored while under deep sedation. Anesthestetic sedation was provided intravenously by Anesthesiology: 130mg  of Propofol, 80mg  of Lidocaine. The patient developed no complications during the procedure. 2D, color and spectral Doppler and 3D TEE imaging was performed to assist with transseptal puncture, device guidance, device deployment and assessment of final results. 3D postproccessing was performed. PREPROCEDURE FINDINGS: Normal left ventricular size, low normal systolic function (EF 82%). 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 Severe eccentric mitral insufficiency with anteriorly directed eccentric jet. The vena contracta is 4.5  mm, ERO area 0.84 cm, regurgitant volume 120 ml, regurgitant fraction 63%. Mitral valve area is 7 cm (planimetry), pressure half time 66 ms, peak gradient 5 mm Hg, mean gradient 1 mm Hg at 59 bpm. No pericardial effusion. Intact atrial septum. Systolic pulmonary vein flow is reversed in both left and right pulmonary veins. Estimated systolic PA pressure 36 mm Hg. POSTPROCEDURE FINDINGS: Normal left ventricular size, mildly reduced normal systolic function (EF 50%). Well seated MitraClip XTW at A2-P2 junction with stable position and well defined tissue bridge. Trivial residual mitral insufficiency lateral to  the clip. Mitral valve area is 3.84 cm (sum of two inflow orifices by planimetry). pressure half time is 96 ms, peak gradient 6 mm Hg, mean gradient 2 mm Hg at 81 bpm. No pericardial effusion. There is a small (2x2 mm) iatrogenic atrial septal defect with exclusively left to right flow. Systolic pulmonary vein flow is dominant in both left and right pulmonary veins. Estimated systolic PA pressure 22 mm Hg. IMPRESSIONS  1. Left ventricular ejection fraction, by estimation, is 55 to 60%. The left ventricle has low normal function. The left ventricle has no regional wall motion abnormalities. Left ventricular diastolic parameters are consistent with Grade II diastolic dysfunction (pseudonormalization). Elevated left atrial pressure.  2. Right ventricular systolic function is normal. The right ventricular size is normal. There is mildly elevated pulmonary artery systolic pressure.  3. Left atrial size was severely dilated. No left atrial/left atrial appendage thrombus was detected.  4. Right atrial size was mildly dilated.  5. Severe flail of posterior leaflet.  6. The mitral valve is myxomatous. Severe mitral valve regurgitation.  7. The tricuspid valve is myxomatous. Tricuspid valve regurgitation is mild to moderate.  8. The aortic valve is tricuspid. Aortic valve regurgitation is not visualized. No aortic  stenosis is present.  9. There is Moderate (Grade III) atheroma plaque involving the transverse and descending aorta. FINDINGS  Left Ventricle: Left ventricular ejection fraction, by estimation, is 55 to 60%. The left ventricle has low normal function. The left ventricle has no regional wall motion abnormalities. The left ventricular internal cavity size was normal in size. There is no left ventricular hypertrophy. Left ventricular diastolic parameters are consistent with Grade II diastolic dysfunction (pseudonormalization). Right Ventricle: The right ventricular size is normal. No increase in right ventricular wall thickness. Right ventricular systolic function is normal. There is mildly elevated pulmonary artery systolic pressure. The tricuspid regurgitant velocity is 2.78  m/s, and with an assumed right atrial pressure of 5 mmHg, the estimated right ventricular systolic pressure is 83.6 mmHg. Left Atrium: Left atrial size was severely dilated. No left atrial/left atrial appendage thrombus was detected. Right Atrium: Right atrial size was mildly dilated. Pericardium: There is no evidence of pericardial effusion. Mitral Valve: The mitral valve is myxomatous. Severe flail of the middle scallop of the posterior mitral valve leaflet. There is mild thickening of the mitral valve leaflet(s). There is severe chordal rupture of the posterior leaflet of the mitral valve.  Severe mitral valve regurgitation, with eccentric anteriorly directed jet. Pulmonary venous flow shows systolic flow reversal. Tricuspid Valve: The tricuspid valve is myxomatous. Tricuspid valve regurgitation is mild to moderate. There is mild prolapse of the tricuspid. Aortic Valve: The aortic valve is tricuspid. Aortic valve regurgitation is not visualized. No aortic stenosis is present. Pulmonic Valve: The pulmonic valve was grossly normal. Pulmonic valve regurgitation is not visualized. Aorta: The aortic root, ascending aorta and aortic arch are all  structurally normal, with no evidence of dilitation or obstruction. There is moderate (Grade III) atheroma plaque involving the transverse and descending aorta. IAS/Shunts: No atrial level shunt detected by color flow Doppler.  LEFT VENTRICLE PLAX 2D LVOT diam:     2.20 cm LV SV:         71 LV SV Index:   38 LVOT Area:     3.80 cm  AORTIC VALVE LVOT Vmax:   77.35 cm/s LVOT Vmean:  53.850 cm/s LVOT VTI:    0.188 m MR Peak grad:    82.8 mmHg  TRICUSPID VALVE MR Mean grad:    54.0 mmHg   TR Peak grad:   30.9 mmHg MR Vmax:         455.00 cm/s TR Vmax:        278.00 cm/s MR Vmean:        347.0 cm/s MR PISA:         7.60 cm    SHUNTS MR PISA Eff ROA: 64 mm      Systemic VTI:  0.19 m MR PISA Radius:  1.10 cm     Systemic Diam: 2.20 cm Dani Gobble Croitoru MD Electronically signed by Sanda Klein MD Signature Date/Time: 08/19/2020/1:27:52 PM    Final    ECHO TEE  Result Date: 08/06/2020    TRANSESOPHOGEAL ECHO REPORT   Patient Name:   REDELL NAZIR Date of Exam: 08/06/2020 Medical Rec #:  [id number]         Height:       68.0 in Accession #:    [id number]        Weight:       164.0 lb Date of Birth:  May 08, 1932          BSA:          1.878 m Patient Age:    44 years          BP:           152/82 mmHg Patient Gender: M                 HR:           78 bpm. Exam Location:  Outpatient Procedure: Transesophageal Echo, Color Doppler, 3D Echo and Cardiac Doppler Indications:     mitral regurgitation  History:         Patient has prior history of Echocardiogram examinations, most                  recent 06/18/2020. CAD; Risk Factors:Hypertension and                  Dyslipidemia.  Sonographer:     Johny Chess Referring Phys:  [phone] Shaughnessy Gethers Diagnosing Phys: Sanda Klein MD PROCEDURE: After discussion of the risks and benefits of a TEE, an informed consent was obtained from the patient. The transesophogeal probe was passed without difficulty through the esophogus of the patient. Local oropharyngeal anesthetic was  provided with Cetacaine. Sedation performed by different physician. The patient was monitored while under deep sedation. Anesthestetic sedation was provided intravenously by Anesthesiology: 70mg  of Propofol, 60mg  of Lidocaine. The patient developed no complications during the procedure. IMPRESSIONS  1. Left ventricular ejection fraction, by estimation, is 55 to 60%. The left ventricle has low normal function. The left ventricle has no regional wall motion abnormalities. The left ventricular internal cavity size was mildly dilated.  2. Right ventricular systolic function is normal. The right ventricular size is normal. There is moderately elevated pulmonary artery systolic pressure.  3. Left atrial size was severely dilated. No left atrial/left atrial appendage thrombus was detected. The LAA emptying velocity was 50 cm/s.  4. Right atrial size was mildly dilated.  5. The P2 (middle) scallop is flail due to ruptured chordae tendinae. There is severe eccentic MR directed anteriorly and towards the septum. Flail gap is 5 mm, maximum leaflet thickness is 3-4 mm, posterior leaflet grasping length is 17 mm. By the PISA  method, the effective regurgitant orifice area is 1.0 cm, regurgitant volume 163 ml, regurgitant fraction  75%. Valve area by planimetry >6 cm. The mitral valve is myxomatous. Severe mitral valve regurgitation. The mean mitral valve gradient is 3.3 mmHg.  6. The tricuspid valve is myxomatous. Tricuspid valve regurgitation is mild to moderate.  7. The aortic valve is tricuspid. Aortic valve regurgitation is not visualized. Mild aortic valve sclerosis is present, with no evidence of aortic valve stenosis.  8. There is Moderate (Grade III) protruding plaque involving the descending aorta. Conclusion(s)/Recommendation(s): The mitral valve appears anatomically suitable for MitraClip repair. FINDINGS  Left Ventricle: Left ventricular ejection fraction, by estimation, is 55 to 60%. The left ventricle has low  normal function. The left ventricle has no regional wall motion abnormalities. The left ventricular internal cavity size was mildly dilated. Right Ventricle: The right ventricular size is normal. No increase in right ventricular wall thickness. Right ventricular systolic function is normal. There is moderately elevated pulmonary artery systolic pressure. The tricuspid regurgitant velocity is 3.61 m/s, and with an assumed right atrial pressure of 5 mmHg, the estimated right ventricular systolic pressure is 17.4 mmHg. Left Atrium: Left atrial size was severely dilated. No left atrial/left atrial appendage thrombus was detected. The LAA emptying velocity was 50 cm/s. Right Atrium: Right atrial size was mildly dilated. Pericardium: There is no evidence of pericardial effusion. Mitral Valve: The P2 (middle) scallop is flail due to ruptured chordae tendinae. There is severe eccentic MR directed anteriorly and towards the septum. Flail gap is 5 mm, maximum leaflet thickness is 3-4 mm, posterior leaflet grasping length is 17 mm. By the PISA method, the effective regurgitant orifice area is 1.0 cm, regurgitant volume 163 ml, regurgitant fraction 75%. Valve area by planimetry >6 cm. The mitral valve is myxomatous. There is mild thickening of the mitral valve leaflet(s). Severe mitral valve regurgitation, with eccentric medially directed jet. The mean mitral valve gradient is 3.3 mmHg with average heart rate of 79 bpm. Pulmonary venous flow shows systolic flow reversal. Tricuspid Valve: The tricuspid valve is myxomatous. Tricuspid valve regurgitation is mild to moderate. There is mild prolapse of the tricuspid. Aortic Valve: The aortic valve is tricuspid. Aortic valve regurgitation is not visualized. Mild aortic valve sclerosis is present, with no evidence of aortic valve stenosis. Pulmonic Valve: The pulmonic valve was normal in structure. Pulmonic valve regurgitation is not visualized. Aorta: The aortic root, ascending  aorta, aortic arch and descending aorta are all structurally normal, with no evidence of dilitation or obstruction. There is moderate (Grade III) protruding plaque involving the descending aorta. IAS/Shunts: No atrial level shunt detected by color flow Doppler.  LEFT VENTRICLE PLAX 2D LVOT diam:     2.19 cm LV SV:         46 LV SV Index:   25 LVOT Area:     3.77 cm  AORTIC VALVE LVOT Vmax:   72.05 cm/s LVOT Vmean:  48.354 cm/s LVOT VTI:    0.123 m MITRAL VALVE                   TRICUSPID VALVE MV Area (plan): 6.46 cm       TR Peak grad:   52.1 mmHg MV Mean grad:   3.3 mmHg       TR Vmax:        361.00 cm/s MR Vmax:           568.00 cm/s MR Vena Contracta: 0.48 cm     SHUNTS MR PISA Eff ROA:   1.0 cm     Systemic VTI:  0.12 m MR PISA Radius:    1.22 cm     Systemic Diam: 2.19 cm Nyquist limit:     61.6 cm/s Dani Gobble Croitoru MD Electronically signed by Sanda Klein MD Signature Date/Time: 08/06/2020/2:16:40 PM    Final    Disposition   Pt is being discharged home today in good condition.  Follow-up Plans & Appointments     Follow-up Information    Shermon, Bozzi, PA-C. Go on 08/26/2020.   Specialties: Cardiology, Radiology Why: @ 1:30pm, please arrive at least 10 minutes early.  Contact information: [address] [place] [address] [phone]                Discharge Medications   Allergies as of 08/20/2020      Reactions   Lisinopril Cough   Simvastatin Other (See Comments)   MUSCLE ACHES REAL BAD      Medication List    TAKE these medications   aspirin EC 81 MG tablet Take 1 tablet (81 mg total) by mouth daily. Swallow whole.   ciprofloxacin-dexamethasone OTIC suspension Commonly known as: CIPRODEX Place 4 drops into the left ear 2 (two) times daily as needed (ear infection).   clopidogrel 75 MG tablet Commonly known as: PLAVIX TAKE 1 TABLET DAILY   FIBER PO Take 1 capsule by mouth daily.   furosemide 20 MG tablet Commonly known as:  LASIX Take 20 mg by mouth.   losartan 25 MG tablet Commonly known as: COZAAR TAKE 1 TABLET DAILY   metoprolol tartrate 25 MG tablet Commonly known as: LOPRESSOR Take 25 mg by mouth 2 (two) times daily.   multivitamin with minerals Tabs tablet Take 1 tablet by mouth daily.   nitroGLYCERIN 0.4 MG SL tablet Commonly known as: NITROSTAT PLACE 1 TABLET UNDER THE TONGUE AT ONSET OF CHEST PAIN EVERY 5 MINTUES UP TO 3 TIMES AS NEEDED What changed: See the new instructions.   REFRESH OP Place 1 drop into both eyes daily.   rosuvastatin 10 MG tablet Commonly known as: Crestor Take 1 tablet (10 mg total) by mouth daily.           Outstanding Labs/Studies  none  Duration of Discharge Encounter   Greater than 30 minutes including physician time.  SignedCarsin, Randazzo, PA-C 08/20/2020, 10:57 AM [id number]  Patient seen, examined. Available data reviewed. Agree with findings, assessment, and plan as outlined by [name], PA-C.  The patient is independently interviewed and examined.  He is feeling well today.  On my exam, he is alert and oriented no distress, lungs are clear, heart is regular rate and rhythm with a 2/6 holosystolic murmur at the apex (marked reduction in murmur from baseline), abdomen is soft and nontender, there is mild bruising at the right groin site with no hematoma, extremities have no edema.  2D echo images are personally reviewed.  The MitraClip device is in appropriate position with mild residual mitral regurgitation.  Formal echo interpretation is pending.  The patient is medically stable for discharge today.  He will remain on dual antiplatelet therapy with aspirin and clopidogrel for at least 6 months after his recent coronary stenting.  Follow-up is arranged as above.  Medications are reviewed.  Sherren Mocha, M.D. 08/20/2020 11:11 AM

## 2020-08-23 ENCOUNTER — Telehealth: Payer: Self-pay | Admitting: Physician Assistant

## 2020-08-23 NOTE — Telephone Encounter (Signed)
  [place] VALVE TEAM   Patient contacted regarding discharge from [facility] on 3/18  Patient understands to follow up with provider [name] on 3/24 @ 1:30pm at [place].  Patient understands discharge instructions? yes Patient understands medications and regimen? yes Patient understands to bring all medications to this visit? yes  Kyshaun Barnette PA-C  [facility]

## 2020-08-26 ENCOUNTER — Encounter: Payer: Self-pay | Admitting: Physician Assistant

## 2020-08-26 ENCOUNTER — Other Ambulatory Visit: Payer: Self-pay

## 2020-08-26 ENCOUNTER — Ambulatory Visit: Payer: Medicare Other | Admitting: Physician Assistant

## 2020-08-26 VITALS — BP 130/70 | HR 65 | Ht 68.0 in | Wt 154.4 lb

## 2020-08-26 DIAGNOSIS — I10 Essential (primary) hypertension: Secondary | ICD-10-CM

## 2020-08-26 DIAGNOSIS — Z9889 Other specified postprocedural states: Secondary | ICD-10-CM | POA: Diagnosis not present

## 2020-08-26 DIAGNOSIS — Z95818 Presence of other cardiac implants and grafts: Secondary | ICD-10-CM | POA: Diagnosis not present

## 2020-08-26 DIAGNOSIS — I2510 Atherosclerotic heart disease of native coronary artery without angina pectoris: Secondary | ICD-10-CM

## 2020-08-26 DIAGNOSIS — Z9861 Coronary angioplasty status: Secondary | ICD-10-CM | POA: Diagnosis not present

## 2020-08-26 NOTE — Progress Notes (Signed)
[facility] [place]                                     Cardiology Office Note:    Date:  08/26/2020   ID:  Adam Arellano, DOB 10/31/1931, MRN [id number]  PCP:  Lajean Manes, MD  [facility] Cardiologist:  Candee Furbish, MD  [facility] Electrophysiologist:  None   Referring MD: Lajean Manes, MD   [facility] s/p MitraClip  History of Present Illness:    Adam Arellano is a 87 y.o. male with a hx of CADwithNSTEMI s/p multiple PCIs,HTN, HLD, PVCs,prostate CA s/p radical prostatectomy and severe MR s/p TEER  (08/19/2020) who presents to clinic for follow up.   Patient's cardiac history dates back to 2007 when he presented with an acute non-ST segment elevation myocardial infarction. He was treated with PCI and stenting of the distal right coronary artery at that time.In 2011 he had recurrent symptoms of angina and underwent repeat PCI and stenting of the right coronary artery for in-stent restenosis. He has done well from a cardiac standpoint since that time and has been followed intermittently by Dr. Marlou Porch. Approximately 2 to 3 months ago the patient began to develop symptoms of exertional shortness of breath and fatigue. He was seen by his primary care physician and found to have a new prominent systolic murmur on physical exam. An echocardiogram was performed demonstrating normal left ventricular systolic function with mitral valve prolapse and severe mitral regurgitation. Patient was seen in follow-up by Dr. Marlou Porch and referred to Dr. Burt Knack for consultation. Transesophageal echocardiogram performed August 06, 2020 confirmed the presence of mitral valve prolapse with a large flail segment involving the middle scallop of the posterior leaflet causing severe mitral regurgitation. Left ventricular systolic function was mildly reduced with ejection fraction estimated 55 to 60%. There was severe left atrial enlargement. No other  significant abnormalities were noted. Diagnostic cardiac catheterization 08/06/20 was performed and revealed severe stenosis of the mid right coronary artery which was successfully treated with PCI and stenting. There was mild nonobstructive disease in the left anterior descending coronary artery and the left circumflex coronary artery with severe stenosis of a medium size intermediate branch.Pulmonary artery pressures were mildly elevated with large V waves consistent with severe mitral regurgitation.   He was evaluated by the multidisciplinary valve team and underwent successful transcatheter edge-to-edge mitral valve repair using a MitraClip XTW device, reducing 4+ mitral regurgitation to 1+, clip positioned A2/P2. Post op echo showed EF 50%, with normally functioning MitaClip with trivial residual mitral valve regurgitation and a mean mitral valve gradient is 3.5 mmHg. He was continued on DAPT with aspirin and Plavix given recent PCI.   Today he presents to clinic for follow up. Here alone. Doing great. Having less swelling and has "more wind." No CP or SOB. No LE edema, orthopnea or PND. No dizziness or syncope. No blood in stool or urine. No palpitations.    Past Medical History:  Diagnosis Date  . Asthma    a. Childhood.  . Coronary artery disease    a. NSTEMI s/p DES to dRCA 2007. b. Angina 07/2009: s/p DESx2 to mid RCA for ISR and prox RCA, EF 65%. c. Stress test 05/2013: low risk, no evidence of ischemia, EF 61%.  . Dyslipidemia   . GERD (gastroesophageal reflux disease)   . History of kidney stones   .  Hypertension   . Peripheral vascular disease ([place])   . Prostate cancer [facility])    a. s/p radical prostatectomy.  [name] PVC's (premature ventricular contractions)   . S/P mitral valve clip implantation 08/19/2020   s/p MitraClip implantation with a XTW by Dr. Burt Knack   . Transient global amnesia    a. Adm 2009: imaging nonacute, had resolved.    Past Surgical History:  Procedure  Laterality Date  . CARDIAC CATHETERIZATION    . COLONOSCOPY    . CORONARY STENT INTERVENTION N/A 08/06/2020   Procedure: CORONARY STENT INTERVENTION;  Surgeon: Sherren Mocha, MD;  Location: [place] [facility];  Service: Cardiovascular;  Laterality: N/A;  . EYE SURGERY Bilateral    cataracts removed  . MASTOIDECTOMY     L ear  . MITRAL VALVE REPAIR    . MITRAL VALVE REPAIR N/A 08/19/2020   Procedure: MITRAL VALVE REPAIR;  Surgeon: Sherren Mocha, MD;  Location: [place] [facility];  Service: Cardiovascular;  Laterality: N/A;  . PROSTATECTOMY    . RIGHT/LEFT HEART CATH AND CORONARY ANGIOGRAPHY N/A 08/06/2020   Procedure: RIGHT/LEFT HEART CATH AND CORONARY ANGIOGRAPHY;  Surgeon: Sherren Mocha, MD;  Location: [place] [facility];  Service: Cardiovascular;  Laterality: N/A;  . TEE WITHOUT CARDIOVERSION N/A 08/06/2020   Procedure: TRANSESOPHAGEAL ECHOCARDIOGRAM (TEE);  Surgeon: Sanda Klein, MD;  Location: [facility];  Service: Cardiovascular;  Laterality: N/A;  . TEE WITHOUT CARDIOVERSION  08/19/2020  . TEE WITHOUT CARDIOVERSION N/A 08/19/2020   Procedure: TRANSESOPHAGEAL ECHOCARDIOGRAM (TEE);  Surgeon: Sherren Mocha, MD;  Location: [place] [facility];  Service: Cardiovascular;  Laterality: N/A;    Current Medications: Current Meds  Medication Sig  . aspirin EC 81 MG tablet Take 1 tablet (81 mg total) by mouth daily. Swallow whole.  . ciprofloxacin-dexamethasone (CIPRODEX) OTIC suspension Place 4 drops into the left ear 2 (two) times daily as needed (ear infection).  . clopidogrel (PLAVIX) 75 MG tablet TAKE 1 TABLET DAILY  . FIBER PO Take 1 capsule by mouth daily.  . furosemide (LASIX) 20 MG tablet Take 20 mg by mouth.  . losartan (COZAAR) 25 MG tablet TAKE 1 TABLET DAILY  . metoprolol tartrate (LOPRESSOR) 25 MG tablet Take 25 mg by mouth 2 (two) times daily.  . Multiple Vitamin (MULTIVITAMIN WITH MINERALS) TABS tablet Take 1 tablet by mouth daily.  . nitroGLYCERIN (NITROSTAT) 0.4 MG SL  tablet PLACE 1 TABLET UNDER THE TONGUE AT ONSET OF CHEST PAIN EVERY 5 MINTUES UP TO 3 TIMES AS NEEDED  . Polyvinyl Alcohol-Povidone (REFRESH OP) Place 1 drop into both eyes daily.  . rosuvastatin (CRESTOR) 10 MG tablet Take 1 tablet (10 mg total) by mouth daily.     Allergies:   Lisinopril and Simvastatin   Social History   Socioeconomic History  . Marital status: Married    Spouse name: Not on file  . Number of children: Not on file  . Years of education: Not on file  . Highest education level: Not on file  Occupational History  . Not on file  Tobacco Use  . Smoking status: Former Smoker    Quit date: 1980    Years since quitting: 42.2  . Smokeless tobacco: Never Used  . Tobacco comment: Smoked for 15 years  Vaping Use  . Vaping Use: Never used  Substance and Sexual Activity  . Alcohol use: Not Currently    Comment: rare  . Drug use: No  . Sexual activity: Not on file  Other Topics Concern  .  Not on file  Social History Narrative  . Not on file   Social Determinants of Health   Financial Resource Strain: Not on file  Food Insecurity: Not on file  Transportation Needs: Not on file  Physical Activity: Not on file  Stress: Not on file  Social Connections: Not on file     Family History: The patient's family history is negative for CAD.  ROS:   Please see the history of present illness.    All other systems reviewed and are negative.  EKGs/Labs/Other Studies Reviewed:    The following studies were reviewed today: 08/19/2020 MITRAL VALVE REPAIR  Conclusion  Successful transcatheter edge-to-edge mitral valve repair using a MitraClip XTW device, reducing 4+ mitral regurgitation to 1+, clip positioned A2/P2 Recommendations  Antiplatelet/Anticoag Recommend uninterrupted dual antiplatelet therapy with Aspirin 81mg  daily and Clopidogrel 75mg  daily for 6 months. Recent PCI    _____________    Echo 08/19/2020:  IMPRESSIONS  1. Left ventricular ejection  fraction, by estimation, is 50%. The left  ventricle has low normal function. The left ventricle has no regional wall  motion abnormalities. Left ventricular diastolic function could not be  evaluated.  2. Right ventricular systolic function is normal. The right ventricular  size is normal. There is moderately elevated pulmonary artery systolic  pressure.  3. Left atrial size was severely dilated.  4. Right atrial size was mildly dilated.  5. The mitral valve has been repaired/replaced. Trivial mitral valve  regurgitation. No evidence of mitral stenosis. The mean mitral valve  gradient is 3.5 mmHg with average heart rate of 66 bpm. There is a  Mitra-Clip present in the mitral position.  Procedure Date: 08/19/2020.  6. The aortic valve is normal in structure. Aortic valve regurgitation is  not visualized. No aortic stenosis is present.  7. The inferior vena cava is dilated in size with >50% respiratory  variability, suggesting right atrial pressure of 8 mmHg.  8. There is a post-procedural small secundum atrial septal defect with  predominantly left to right shunting across the atrial septum.    EKG:  EKG is NOT ordered today.    Recent Labs: 08/17/2020: ALT 11 08/20/2020: BUN 22; Creatinine, Ser 1.54; Hemoglobin 10.0; Platelets 207; Potassium 3.8; Sodium 140  Recent Lipid Panel    Component Value Date/Time   CHOL  08/27/2007 0415    118        ATP III CLASSIFICATION:  <200     mg/dL   Desirable  200-239  mg/dL   Borderline High  >=240    mg/dL   High   TRIG 53 08/27/2007 0415   HDL 34 (L) 08/27/2007 0415   CHOLHDL 3.5 08/27/2007 0415   VLDL 11 08/27/2007 0415   LDLCALC  08/27/2007 0415    73        Total Cholesterol/HDL:CHD Risk Coronary Heart Disease Risk Table                     Men   Women  1/2 Average Risk   3.4   3.3     Risk Assessment/Calculations:       Physical Exam:    VS:  BP 130/70   Pulse 65   Ht 5\' 8"  (1.727 m)   Wt 154 lb 6.4 oz (70 kg)    SpO2 98%   BMI 23.48 kg/m     Wt Readings from Last 3 Encounters:  08/26/20 154 lb 6.4 oz (70 kg)  08/19/20 160 lb (72.6  kg)  08/17/20 162 lb 14.4 oz (73.9 kg)     GEN:  Well nourished, well developed in no acute distress HEENT: Normal NECK: No JVD; No carotid bruits LYMPHATICS: No lymphadenopathy CARDIAC: RRR, no murmurs, rubs, gallops RESPIRATORY:  Clear to auscultation without rales, wheezing or rhonchi  ABDOMEN: Soft, non-tender, non-distended MUSCULOSKELETAL:  No edema; No deformity  SKIN: Warm and dry.  right groin with mild ecchymosis but no hematoma NEUROLOGIC:  Alert and oriented x 3 PSYCHIATRIC:  Normal affect   ASSESSMENT:    1. S/P mitral valve clip implantation   2. CAD S/P percutaneous coronary angioplasty   3. Essential hypertension    PLAN:    In order of problems listed above:  Severe non rheumatic MR s/p TEER: groin site healing well. Has had a big clinical improvement since clip with reduced LE edema and less shortness of breath. Continue on DAPT. SBE prophylaxis discussed; the patient is edentulous and does not go to the dentist. I will see him back for 1 month echo and follow up.    CAD: s/p overlapping DES x2 to distal RCA on 08/06/20. Plan for continued DAPT for at least 6 months. Continue statin.   HTN: BP well controlled today. No changes made.    Medication Adjustments/Labs and Tests Ordered: Current medicines are reviewed at length with the patient today.  Concerns regarding medicines are outlined above.  Orders Placed This Encounter  Procedures  . EKG 12-Lead   No orders of the defined types were placed in this encounter.   Patient Instructions  Medication Instructions:  No changes *If you need a refill on your cardiac medications before your next appointment, please call your pharmacy*   Lab Work: none If you have labs (blood work) drawn today and your tests are completely normal, you will receive your results only by: [name] MyChart  Message (if you have MyChart) OR . A paper copy in the mail If you have any lab test that is abnormal or we need to change your treatment, we will call you to review the results.   Testing/Procedures: none   Follow-Up: As planned   Other Instructions none     Signed, Keven Osborn, PA-C  08/26/2020 1:30 PM    [facility]

## 2020-08-26 NOTE — Patient Instructions (Signed)
Medication Instructions:  No changes *If you need a refill on your cardiac medications before your next appointment, please call your pharmacy*   Lab Work: none If you have labs (blood work) drawn today and your tests are completely normal, you will receive your results only by: [name] MyChart Message (if you have MyChart) OR . A paper copy in the mail If you have any lab test that is abnormal or we need to change your treatment, we will call you to review the results.   Testing/Procedures: none   Follow-Up: As planned   Other Instructions none

## 2020-09-14 DIAGNOSIS — M25511 Pain in right shoulder: Secondary | ICD-10-CM | POA: Diagnosis not present

## 2020-09-16 DIAGNOSIS — H353131 Nonexudative age-related macular degeneration, bilateral, early dry stage: Secondary | ICD-10-CM | POA: Diagnosis not present

## 2020-09-16 DIAGNOSIS — Z961 Presence of intraocular lens: Secondary | ICD-10-CM | POA: Diagnosis not present

## 2020-09-16 DIAGNOSIS — H26492 Other secondary cataract, left eye: Secondary | ICD-10-CM | POA: Diagnosis not present

## 2020-09-16 DIAGNOSIS — H04123 Dry eye syndrome of bilateral lacrimal glands: Secondary | ICD-10-CM | POA: Diagnosis not present

## 2020-09-16 DIAGNOSIS — H40013 Open angle with borderline findings, low risk, bilateral: Secondary | ICD-10-CM | POA: Diagnosis not present

## 2020-09-30 ENCOUNTER — Ambulatory Visit: Payer: Medicare Other | Admitting: Physician Assistant

## 2020-09-30 ENCOUNTER — Ambulatory Visit (HOSPITAL_COMMUNITY): Payer: Medicare Other | Attending: Cardiology

## 2020-09-30 ENCOUNTER — Encounter: Payer: Self-pay | Admitting: Physician Assistant

## 2020-09-30 ENCOUNTER — Other Ambulatory Visit: Payer: Self-pay

## 2020-09-30 VITALS — BP 164/79 | HR 55 | Ht 68.5 in | Wt 153.4 lb

## 2020-09-30 DIAGNOSIS — Z9861 Coronary angioplasty status: Secondary | ICD-10-CM

## 2020-09-30 DIAGNOSIS — I2510 Atherosclerotic heart disease of native coronary artery without angina pectoris: Secondary | ICD-10-CM | POA: Diagnosis not present

## 2020-09-30 DIAGNOSIS — Z95818 Presence of other cardiac implants and grafts: Secondary | ICD-10-CM

## 2020-09-30 DIAGNOSIS — I340 Nonrheumatic mitral (valve) insufficiency: Secondary | ICD-10-CM | POA: Diagnosis not present

## 2020-09-30 DIAGNOSIS — Z9889 Other specified postprocedural states: Secondary | ICD-10-CM

## 2020-09-30 DIAGNOSIS — I10 Essential (primary) hypertension: Secondary | ICD-10-CM | POA: Diagnosis not present

## 2020-09-30 LAB — ECHOCARDIOGRAM COMPLETE
Area-P 1/2: 1.61 cm2
MV M vel: 6.58 m/s
MV Peak grad: 173.2 mmHg
MV VTI: 1.89 cm2
S' Lateral: 3.5 cm

## 2020-09-30 MED ORDER — AMLODIPINE BESYLATE 5 MG PO TABS: 5.0000 mg | ORAL_TABLET | Freq: Every day | ORAL | 3 refills | Status: DC

## 2020-09-30 NOTE — Patient Instructions (Signed)
Medication Instructions:  1) START AMLODIPINE 5 mg daily  2) You may STOP PLAVIX February 06, 2021 *If you need a refill on your cardiac medications before your next appointment, please call your pharmacy*   Follow-Up: Your provider recommends that you schedule a follow-up appointment in 3 months with Dr. Marlou Porch.

## 2020-09-30 NOTE — Progress Notes (Signed)
[facility] [name]                                     Cardiology Office Note:    Date:  10/01/2020   ID:  Adam Arellano, DOB 04/10/1932, MRN [id number]  PCP:  Lajean Manes, MD  [facility] Cardiologist:  Candee Furbish, MD  [facility] Electrophysiologist:  None   Referring MD: Lajean Manes, MD   1 month s/p MitraClip  History of Present Illness:    Adam Arellano is a 87 y.o. male with a hx of CADwithNSTEMI s/p multiple PCIs,HTN, HLD, PVCs,prostate CA s/p radical prostatectomy and severe MR s/p TEER  (08/19/2020) who presents to clinic for follow up.   Patient's cardiac history dates back to 2007 when he presented with an acute non-ST segment elevation myocardial infarction. He was treated with PCI and stenting of the distal right coronary artery at that time.In 2011 he had recurrent symptoms of angina and underwent repeat PCI and stenting of the right coronary artery for in-stent restenosis. He has done well from a cardiac standpoint since that time and has been followed intermittently by Dr. Marlou Porch. Approximately 2 to 3 months ago the patient began to develop symptoms of exertional shortness of breath and fatigue. He was seen by his primary care physician and found to have a new prominent systolic murmur on physical exam. An echocardiogram was performed demonstrating normal left ventricular systolic function with mitral valve prolapse and severe mitral regurgitation. Patient was seen in follow-up by Dr. Marlou Porch and referred to Dr. Burt Knack for consultation. Transesophageal echocardiogram performed August 06, 2020 confirmed the presence of mitral valve prolapse with a large flail segment involving the middle scallop of the posterior leaflet causing severe mitral regurgitation. Left ventricular systolic function was mildly reduced with ejection fraction estimated 55 to 60%. There was severe left atrial enlargement. No other  significant abnormalities were noted. Diagnostic cardiac catheterization 08/06/20 was performed and revealed severe stenosis of the mid right coronary artery which was successfully treated with PCI and stenting. There was mild nonobstructive disease in the left anterior descending coronary artery and the left circumflex coronary artery with severe stenosis of a medium size intermediate branch.Pulmonary artery pressures were mildly elevated with large V waves consistent with severe mitral regurgitation.   He was evaluated by the multidisciplinary valve team and underwent successful transcatheter edge-to-edge mitral valve repair using a MitraClip XTW device, reducing 4+ mitral regurgitation to 1+, clip positioned A2/P2. Post op echo showed EF 50%, with normally functioning MitaClip with trivial residual mitral valve regurgitation and a mean mitral valve gradient is 3.5 mmHg. He was continued on DAPT with aspirin and Plavix given recent PCI. He has done quite well in follow up with less LE edema and dyspnea.   Today he presents to clinic for follow up. No CP or SOB. No LE edema, orthopnea or PND. No dizziness or syncope. No blood in stool or urine. No palpitations. Stays quite active and has had a big improvement in symptoms since clip  Past Medical History:  Diagnosis Date  . Asthma    a. Childhood.  . Coronary artery disease    a. NSTEMI s/p DES to dRCA 2007. b. Angina 07/2009: s/p DESx2 to mid RCA for ISR and prox RCA, EF 65%. c. Stress test 05/2013: low risk, no evidence of ischemia, EF 61%.  Adam Arellano  Dyslipidemia   . GERD (gastroesophageal reflux disease)   . History of kidney stones   . Hypertension   . Peripheral vascular disease ([place])   . Prostate cancer [facility])    a. s/p radical prostatectomy.  Adam Arellano PVC's (premature ventricular contractions)   . S/P mitral valve clip implantation 08/19/2020   s/p MitraClip implantation with a XTW by Dr. Burt Knack   . Transient global amnesia    a. Adm 2009: imaging  nonacute, had resolved.    Past Surgical History:  Procedure Laterality Date  . CARDIAC CATHETERIZATION    . COLONOSCOPY    . CORONARY STENT INTERVENTION N/A 08/06/2020   Procedure: CORONARY STENT INTERVENTION;  Surgeon: Sherren Mocha, MD;  Location: [place] CV [facility];  Service: Cardiovascular;  Laterality: N/A;  . EYE SURGERY Bilateral    cataracts removed  . MASTOIDECTOMY     L ear  . MITRAL VALVE REPAIR    . MITRAL VALVE REPAIR N/A 08/19/2020   Procedure: MITRAL VALVE REPAIR;  Surgeon: Sherren Mocha, MD;  Location: [place] [facility];  Service: Cardiovascular;  Laterality: N/A;  . PROSTATECTOMY    . RIGHT/LEFT HEART CATH AND CORONARY ANGIOGRAPHY N/A 08/06/2020   Procedure: RIGHT/LEFT HEART CATH AND CORONARY ANGIOGRAPHY;  Surgeon: Sherren Mocha, MD;  Location: [place] [facility];  Service: Cardiovascular;  Laterality: N/A;  . TEE WITHOUT CARDIOVERSION N/A 08/06/2020   Procedure: TRANSESOPHAGEAL ECHOCARDIOGRAM (TEE);  Surgeon: Sanda Klein, MD;  Location: [facility] ENDOSCOPY;  Service: Cardiovascular;  Laterality: N/A;  . TEE WITHOUT CARDIOVERSION  08/19/2020  . TEE WITHOUT CARDIOVERSION N/A 08/19/2020   Procedure: TRANSESOPHAGEAL ECHOCARDIOGRAM (TEE);  Surgeon: Sherren Mocha, MD;  Location: [place] CV LAB;  Service: Cardiovascular;  Laterality: N/A;    Current Medications: Current Meds  Medication Sig  . amLODipine (NORVASC) 5 MG tablet Take 1 tablet (5 mg total) by mouth daily.  Adam Arellano aspirin EC 81 MG tablet Take 1 tablet (81 mg total) by mouth daily. Swallow whole.  . ciprofloxacin-dexamethasone (CIPRODEX) OTIC suspension Place 4 drops into the left ear 2 (two) times daily as needed (ear infection).  . clopidogrel (PLAVIX) 75 MG tablet TAKE 1 TABLET DAILY  . FIBER PO Take 1 capsule by mouth daily.  . furosemide (LASIX) 20 MG tablet Take 20 mg by mouth.  . losartan (COZAAR) 25 MG tablet TAKE 1 TABLET DAILY  . metoprolol tartrate (LOPRESSOR) 25 MG tablet Take 25 mg by mouth 2 (two)  times daily.  . Multiple Vitamin (MULTIVITAMIN WITH MINERALS) TABS tablet Take 1 tablet by mouth daily.  . nitroGLYCERIN (NITROSTAT) 0.4 MG SL tablet PLACE 1 TABLET UNDER THE TONGUE AT ONSET OF CHEST PAIN EVERY 5 MINTUES UP TO 3 TIMES AS NEEDED  . Polyvinyl Alcohol-Povidone (REFRESH OP) Place 1 drop into both eyes daily.  . rosuvastatin (CRESTOR) 10 MG tablet Take 1 tablet (10 mg total) by mouth daily.     Allergies:   Lisinopril and Simvastatin   Social History   Socioeconomic History  . Marital status: Married    Spouse name: Not on file  . Number of children: Not on file  . Years of education: Not on file  . Highest education level: Not on file  Occupational History  . Not on file  Tobacco Use  . Smoking status: Former Smoker    Quit date: 1980    Years since quitting: 42.3  . Smokeless tobacco: Never Used  . Tobacco comment: Smoked for 15 years  Vaping Use  . Vaping Use:  Never used  Substance and Sexual Activity  . Alcohol use: Not Currently    Comment: rare  . Drug use: No  . Sexual activity: Not on file  Other Topics Concern  . Not on file  Social History Narrative  . Not on file   Social Determinants of Health   Financial Resource Strain: Not on file  Food Insecurity: Not on file  Transportation Needs: Not on file  Physical Activity: Not on file  Stress: Not on file  Social Connections: Not on file     Family History: The patient's family history is negative for CAD.  ROS:   Please see the history of present illness.    All other systems reviewed and are negative.  EKGs/Labs/Other Studies Reviewed:    The following studies were reviewed today: 08/19/2020 MITRAL VALVE REPAIR  Conclusion  Successful transcatheter edge-to-edge mitral valve repair using a MitraClip XTW device, reducing 4+ mitral regurgitation to 1+, clip positioned A2/P2 Recommendations  Antiplatelet/Anticoag Recommend uninterrupted dual antiplatelet therapy with Aspirin 81mg  daily  and Clopidogrel 75mg  daily for 6 months. Recent PCI    _____________    Echo 08/19/2020:  IMPRESSIONS  1. Left ventricular ejection fraction, by estimation, is 50%. The left  ventricle has low normal function. The left ventricle has no regional wall  motion abnormalities. Left ventricular diastolic function could not be  evaluated.  2. Right ventricular systolic function is normal. The right ventricular  size is normal. There is moderately elevated pulmonary artery systolic  pressure.  3. Left atrial size was severely dilated.  4. Right atrial size was mildly dilated.  5. The mitral valve has been repaired/replaced. Trivial mitral valve  regurgitation. No evidence of mitral stenosis. The mean mitral valve  gradient is 3.5 mmHg with average heart rate of 66 bpm. There is a  Mitra-Clip present in the mitral position.  Procedure Date: 08/19/2020.  6. The aortic valve is normal in structure. Aortic valve regurgitation is  not visualized. No aortic stenosis is present.  7. The inferior vena cava is dilated in size with >50% respiratory  variability, suggesting right atrial pressure of 8 mmHg.  8. There is a post-procedural small secundum atrial septal defect with  predominantly left to right shunting across the atrial septum.   _______________________  Echo 09/30/2020 IMPRESSIONS  1. Left ventricular ejection fraction, by estimation, is 50 to 55%. The left ventricle has low normal function. The left ventricle has no regional wall motion abnormalities. Left ventricular diastolic parameters are consistent with Grade I diastolic  dysfunction (impaired relaxation).  2. Right ventricular systolic function is normal. The right ventricular size is normal. There is normal pulmonary artery systolic pressure.  3. Left atrial size was severely dilated.  4. Right atrial size was moderately dilated.  5. The mitral valve has been repaired/replaced. Mild mitral valve regurgitation. No  evidence of mitral stenosis. The mean mitral valve gradient is 2.5 mmHg. There is a Mitra-Clip present in the mitral position. Procedure Date: 08/19/20. Echo findings are  consistent with normal structure and function of the mitral valve prosthesis.  6. Tricuspid valve regurgitation is mild to moderate.  7. The aortic valve is normal in structure. There is mild calcification of the aortic valve. There is mild thickening of the aortic valve. Aortic valve regurgitation is not visualized. Mild aortic valve sclerosis is present, with no evidence of aortic  valve stenosis.  8. The inferior vena cava is normal in size with greater than 50% respiratory variability, suggesting right  atrial pressure of 3 mmHg.  9. Evidence of atrial level shunting detected by color flow Doppler. There is a small secundum atrial septal defect with predominantly left to right shunting across the atrial septum.  Comparison(s): No significant change from prior study  EKG:  EKG is NOT ordered today.    Recent Labs: 08/17/2020: ALT 11 08/20/2020: BUN 22; Creatinine, Ser 1.54; Hemoglobin 10.0; Platelets 207; Potassium 3.8; Sodium 140  Recent Lipid Panel    Component Value Date/Time   CHOL  08/27/2007 0415    118        ATP III CLASSIFICATION:  <200     mg/dL   Desirable  200-239  mg/dL   Borderline High  >=240    mg/dL   High   TRIG 53 08/27/2007 0415   HDL 34 (L) 08/27/2007 0415   CHOLHDL 3.5 08/27/2007 0415   VLDL 11 08/27/2007 0415   LDLCALC  08/27/2007 0415    73        Total Cholesterol/HDL:CHD Risk Coronary Heart Disease Risk Table                     Men   Women  1/2 Average Risk   3.4   3.3     Risk Assessment/Calculations:       Physical Exam:    VS:  BP (!) 164/79   Pulse (!) 55   Ht 5' 8.5" (1.74 m)   Wt 153 lb 6.4 oz (69.6 kg)   SpO2 98%   BMI 22.99 kg/m     Wt Readings from Last 3 Encounters:  09/30/20 153 lb 6.4 oz (69.6 kg)  08/26/20 154 lb 6.4 oz (70 kg)  08/19/20 160 lb (72.6 kg)      GEN:  Well nourished, well developed in no acute distress HEENT: Normal NECK: No JVD; LYMPHATICS: No lymphadenopathy CARDIAC: RRR, no murmurs, rubs, gallops RESPIRATORY:  Clear to auscultation without rales, wheezing or rhonchi  ABDOMEN: Soft, non-tender, non-distended MUSCULOSKELETAL: trace bilateral LE edema; No deformity  SKIN: Warm and dry NEUROLOGIC:  Alert and oriented x 3 PSYCHIATRIC:  Normal affect   ASSESSMENT:    1. S/P mitral valve clip implantation   2. CAD S/P percutaneous coronary angioplasty   3. Essential hypertension    PLAN:    In order of problems listed above:  Severe non rheumatic MR s/p TEER: echo today shows EF 50% with normally functioning Mitraclip with mild residual MR and mean gradient of 2.65mm hg. He is doing well with NYHA class I symptoms; he has had a big improvement in breathing since TEER. Continue on aspirin and plavix. He can stop plavix after 6 months out from PCI. SBE prophylaxis discussed; the patient is edentulous and does not go to the dentist. I will see him back in 1 year with an echo.    CAD: s/p overlapping DES x2 to distal RCA on 08/06/20. Plan for continued DAPT for at least 6 months. Can stop plavix on 02/06/21. Continue statin and baby aspirin.   HTN: BP elevated today. Cannot increase ARB due to CKD and cannot increase BB due to bradycardia. Will start norvasc 5mg  daily. He will continue to monitor his BP at home and call us if it remains elevated.    Medication Adjustments/Labs and Tests Ordered: Current medicines are reviewed at length with the patient today.  Concerns regarding medicines are outlined above.  No orders of the defined types were placed in this encounter.  Meds ordered  this encounter  Medications  . amLODipine (NORVASC) 5 MG tablet    Sig: Take 1 tablet (5 mg total) by mouth daily.    Dispense:  90 tablet    Refill:  3    Patient Instructions  Medication Instructions:  1) START AMLODIPINE 5 mg daily  2)  You may STOP PLAVIX February 06, 2021 *If you need a refill on your cardiac medications before your next appointment, please call your pharmacy*   Follow-Up: Your provider recommends that you schedule a follow-up appointment in 3 months with Dr. Marlou Porch.    Signed, Sekani Mottl, PA-C  10/01/2020 9:38 AM    [facility]

## 2020-10-16 ENCOUNTER — Other Ambulatory Visit: Payer: Self-pay | Admitting: Cardiology

## 2020-11-29 DIAGNOSIS — D631 Anemia in chronic kidney disease: Secondary | ICD-10-CM | POA: Diagnosis not present

## 2020-11-29 DIAGNOSIS — E7800 Pure hypercholesterolemia, unspecified: Secondary | ICD-10-CM | POA: Diagnosis not present

## 2020-11-29 DIAGNOSIS — Z1389 Encounter for screening for other disorder: Secondary | ICD-10-CM | POA: Diagnosis not present

## 2020-11-29 DIAGNOSIS — N1832 Chronic kidney disease, stage 3b: Secondary | ICD-10-CM | POA: Diagnosis not present

## 2020-11-29 DIAGNOSIS — Z79899 Other long term (current) drug therapy: Secondary | ICD-10-CM | POA: Diagnosis not present

## 2020-11-29 DIAGNOSIS — I129 Hypertensive chronic kidney disease with stage 1 through stage 4 chronic kidney disease, or unspecified chronic kidney disease: Secondary | ICD-10-CM | POA: Diagnosis not present

## 2020-11-29 DIAGNOSIS — Z0000 Encounter for general adult medical examination without abnormal findings: Secondary | ICD-10-CM | POA: Diagnosis not present

## 2020-12-27 ENCOUNTER — Telehealth: Payer: Self-pay | Admitting: Cardiology

## 2020-12-27 NOTE — Telephone Encounter (Signed)
Pt c/o swelling: STAT is pt has developed SOB within 24 hours  If swelling, where is the swelling located? Feet and legs are swelling  How much weight have you gained and in what time span? Do not know, pt will not weigh  Have you gained 3 pounds in a day or 5 pounds in a week?   Do you have a log of your daily weights (if so, list)? no  Are you currently taking a fluid pill? yes  Are you currently SOB? Sometimes, when he exerts himself and when he walks Have you traveled recently? No, pt wants to be seen asap please

## 2020-12-27 NOTE — Telephone Encounter (Signed)
Called [name] ok per DPR.  She reports that pt called her today to let her know he is SOB with activity and has had BLE swelling for a few weeks.  She reports that pt does eat processed foods and is not mindful of salt intake.  He does not wear compression socks I advised [name] to have pt wear them to help with swelling.  He does elevate his legs throughout the day and she does not have a recent BP reading.  I will call pt to obtain recent BP reading and more info regarding SOB.  Called pt he reports that he does not have a recent BP reading.  He is able to do his normal daily activities but he has to take breaks often.  He denies pain with  deep breath.  Per pt he drinks about 4-5 bottles of water per day.  He has no trouble voiding. He takes furosemide as ordered.  Will route to MD to advise.

## 2020-12-28 NOTE — Telephone Encounter (Signed)
Pts daughter aware of recommendations. No additional questions.

## 2021-01-04 ENCOUNTER — Other Ambulatory Visit: Payer: Self-pay

## 2021-01-04 MED ORDER — FUROSEMIDE 40 MG PO TABS: 40.0000 mg | ORAL_TABLET | Freq: Every day | ORAL | 2 refills | Status: DC

## 2021-01-13 NOTE — Progress Notes (Signed)
Cardiology Office Note:    Date:  01/14/2021   ID:  Adam Arellano, DOB 1931-06-29, MRN [id number]  PCP:  Lajean Manes, MD  [facility] Cardiologist:  Candee Furbish, MD  [facility] Electrophysiologist:  None   Referring MD: Lajean Manes, MD    History of Present Illness:    Adam Arellano is a 87 y.o. male here for the follow-up of coronary artery disease.  Has hypertension hyperlipidemia and prior PVCs noted as well.  Her daughter is a Marine scientist.  Dr. Felipa Eth saw him after lower extremity edema develop.  Placed him on Lasix.  He also had some shortness of breath at that time.  His breathing improved with Lasix administration as well as his lower extremity edema improved.  He still very active.    Today,  Adam Arellano is here for the follow-up of coronary artery disease, hypertension, and MR. On 08/19/2020 he underwent a mitral valve clip implantation. He was continued on DAPT with aspirin and Plavix given recent PCI.   Overall, he appears well. His main complaint today is experiencing shooting pains up and down his left UE. Each episode only lasts for a few seconds, and may occur at any time. However, he mostly notices the pain while he is sitting. Also, he reports having low energy levels lately.  He checks his blood pressure daily, and has an average of 120/72.  In his regimen he continues to take Plavix, but has discontinued the aspirin and Crestor.   He denies any palpitations, chest pain, or shortness of breath. No lightheadedness, headaches, syncope, orthopnea, or PND. Also has no lower extremity edema or exertional symptoms.   Past Medical History:  Diagnosis Date   Asthma    a. Childhood.   Coronary artery disease    a. NSTEMI s/p DES to dRCA 2007. b. Angina 07/2009: s/p DESx2 to mid RCA for ISR and prox RCA, EF 65%. c. Stress test 05/2013: low risk, no evidence of ischemia, EF 61%.   Dyslipidemia    GERD (gastroesophageal reflux disease)    History of  kidney stones    Hypertension    Peripheral vascular disease (HCC)    Prostate cancer ([place])    a. s/p radical prostatectomy.   PVC's (premature ventricular contractions)    S/P mitral valve clip implantation 08/19/2020   s/p MitraClip implantation with a XTW by Dr. Burt Knack    Transient global amnesia    a. Adm 2009: imaging nonacute, had resolved.    Past Surgical History:  Procedure Laterality Date   CARDIAC CATHETERIZATION     COLONOSCOPY     CORONARY STENT INTERVENTION N/A 08/06/2020   Procedure: CORONARY STENT INTERVENTION;  Surgeon: Sherren Mocha, MD;  Location: [place];  Service: Cardiovascular;  Laterality: N/A;   EYE SURGERY Bilateral    cataracts removed   MASTOIDECTOMY     L ear   MITRAL VALVE REPAIR     MITRAL VALVE REPAIR N/A 08/19/2020   Procedure: MITRAL VALVE REPAIR;  Surgeon: Sherren Mocha, MD;  Location: [facility];  Service: Cardiovascular;  Laterality: N/A;   PROSTATECTOMY     RIGHT/LEFT HEART CATH AND CORONARY ANGIOGRAPHY N/A 08/06/2020   Procedure: RIGHT/LEFT HEART CATH AND CORONARY ANGIOGRAPHY;  Surgeon: Sherren Mocha, MD;  Location: [facility];  Service: Cardiovascular;  Laterality: N/A;   TEE WITHOUT CARDIOVERSION N/A 08/06/2020   Procedure: TRANSESOPHAGEAL ECHOCARDIOGRAM (TEE);  Surgeon: Sanda Klein, MD;  Location: [facility];  Service: Cardiovascular;  Laterality: N/A;   TEE WITHOUT CARDIOVERSION  08/19/2020   TEE WITHOUT CARDIOVERSION N/A 08/19/2020   Procedure: TRANSESOPHAGEAL ECHOCARDIOGRAM (TEE);  Surgeon: Sherren Mocha, MD;  Location: [facility];  Service: Cardiovascular;  Laterality: N/A;    Current Medications: Current Meds  Medication Sig   ciprofloxacin-dexamethasone (CIPRODEX) OTIC suspension Place 4 drops into the left ear 2 (two) times daily as needed (ear infection).   clopidogrel (PLAVIX) 75 MG tablet TAKE 1 TABLET DAILY   FIBER PO Take 1 capsule by mouth daily.   furosemide (LASIX) 40 MG tablet  Take 1 tablet (40 mg total) by mouth daily.   metoprolol tartrate (LOPRESSOR) 25 MG tablet Take 25 mg by mouth 2 (two) times daily.   Multiple Vitamin (MULTIVITAMIN WITH MINERALS) TABS tablet Take 1 tablet by mouth daily.   Polyvinyl Alcohol-Povidone (REFRESH OP) Place 1 drop into both eyes daily.   [DISCONTINUED] losartan (COZAAR) 25 MG tablet Take 1 tablet (25 mg total) by mouth daily. Please keep upcoming appointment in August 2022 for future refills. Thank you   [DISCONTINUED] nitroGLYCERIN (NITROSTAT) 0.4 MG SL tablet PLACE 1 TABLET UNDER THE TONGUE AT ONSET OF CHEST PAIN EVERY 5 MINTUES UP TO 3 TIMES AS NEEDED     Allergies:   Lisinopril and Simvastatin   Social History   Socioeconomic History   Marital status: Married    Spouse name: Not on file   Number of children: Not on file   Years of education: Not on file   Highest education level: Not on file  Occupational History   Not on file  Tobacco Use   Smoking status: Former    Types: Cigarettes    Quit date: 1980    Years since quitting: 42.6   Smokeless tobacco: Never   Tobacco comments:    Smoked for 15 years  Vaping Use   Vaping Use: Never used  Substance and Sexual Activity   Alcohol use: Not Currently    Comment: rare   Drug use: No   Sexual activity: Not on file  Other Topics Concern   Not on file  Social History Narrative   Not on file   Social Determinants of Health   Financial Resource Strain: Not on file  Food Insecurity: Not on file  Transportation Needs: Not on file  Physical Activity: Not on file  Stress: Not on file  Social Connections: Not on file     Family History: The patient's family history is negative for CAD.  ROS:   Please see the history of present illness. (+) Shooting pains in left UE (+) Fatigue All other systems reviewed and are negative.  EKGs/Labs/Other Studies Reviewed:    The following studies were reviewed today:  Echo 09/30/2020:  1. Left ventricular ejection  fraction, by estimation, is 50 to 55%. The  left ventricle has low normal function. The left ventricle has no regional  wall motion abnormalities. Left ventricular diastolic parameters are  consistent with Grade I diastolic  dysfunction (impaired relaxation).   2. Right ventricular systolic function is normal. The right ventricular  size is normal. There is normal pulmonary artery systolic pressure.   3. Left atrial size was severely dilated.   4. Right atrial size was moderately dilated.   5. The mitral valve has been repaired/replaced. Mild mitral valve  regurgitation. No evidence of mitral stenosis. The mean mitral valve  gradient is 2.5 mmHg. There is a Mitra-Clip present in the mitral  position. Procedure Date: 08/19/20. Echo findings  are   consistent with normal structure and function of the mitral valve  prosthesis.   6. Tricuspid valve regurgitation is mild to moderate.   7. The aortic valve is normal in structure. There is mild calcification  of the aortic valve. There is mild thickening of the aortic valve. Aortic  valve regurgitation is not visualized. Mild aortic valve sclerosis is  present, with no evidence of aortic  valve stenosis.   8. The inferior vena cava is normal in size with greater than 50%  respiratory variability, suggesting right atrial pressure of 3 mmHg.   9. Evidence of atrial level shunting detected by color flow Doppler.  There is a small secundum atrial septal defect with predominantly left to  right shunting across the atrial septum.   Comparison(s): No significant change from prior study.  Echo TEE 08/19/2020:  1. Left ventricular ejection fraction, by estimation, is 55 to 60%. The  left ventricle has low normal function. The left ventricle has no regional  wall motion abnormalities. Left ventricular diastolic parameters are  consistent with Grade II diastolic  dysfunction (pseudonormalization). Elevated left atrial pressure.   2. Right ventricular  systolic function is normal. The right ventricular  size is normal. There is mildly elevated pulmonary artery systolic  pressure.   3. Left atrial size was severely dilated. No left atrial/left atrial  appendage thrombus was detected.   4. Right atrial size was mildly dilated.   5. Severe flail of posterior leaflet.   6. The mitral valve is myxomatous. Severe mitral valve regurgitation.   7. The tricuspid valve is myxomatous. Tricuspid valve regurgitation is  mild to moderate.   8. The aortic valve is tricuspid. Aortic valve regurgitation is not  visualized. No aortic stenosis is present.   9. There is Moderate (Grade III) atheroma plaque involving the transverse  and descending aorta.   Mitral Valve Repair 08/19/2020: Successful transcatheter edge-to-edge mitral valve repair using a MitraClip XTW device, reducing 4+ mitral regurgitation to 1+, clip positioned A2/P2  RHC/LHC 08/06/2020: 1.  Patent left mainstem with mild nonobstructive plaquing 2.  Patent LAD with mild diffuse calcific nonobstructive stenosis 3.  Patent circumflex with mild nonobstructive stenosis 4.  Severe stenosis of a moderate caliber ramus intermedius branch, favor medical therapy and an absence of angina 5.  Severe stenosis of a large, dominant RCA, successfully treated with PTCA and stenting using a 3.5 x 22 mm resolute Onyx DES deployed in overlapping fashion with a previously implanted distal RCA stent 6.  Normal LVEDP 7.  Hemodynamics consistent with severe mitral regurgitation with a pulmonary capillary wedge pressure V wave of 45 mmHg, mean wedge pressure 24 mmHg, mean PA pressure 29 mmHg   Recommendations: Aspirin and clopidogrel without interruption x6 months as tolerated Cardiac surgical consultation for evaluation of severe mitral regurgitation, consideration of transcatheter edge-to-edge mitral valve repair in this elderly patient Medical therapy for residual CAD  Lower extremity Doppler  05/13/2020-negative for DVT    EKG:     06/15/2020: sinus rhythm 67, PVC.  Recent Labs: 08/17/2020: ALT 11 08/20/2020: BUN 22; Creatinine, Ser 1.54; Hemoglobin 10.0; Platelets 207; Potassium 3.8; Sodium 140   Recent Lipid Panel    Component Value Date/Time   CHOL  08/27/2007 0415    118        ATP III CLASSIFICATION:  <200     mg/dL   Desirable  200-239  mg/dL   Borderline High  >=240    mg/dL   High  TRIG 53 08/27/2007 0415   HDL 34 (L) 08/27/2007 0415   CHOLHDL 3.5 08/27/2007 0415   VLDL 11 08/27/2007 0415   LDLCALC  08/27/2007 0415    73        Total Cholesterol/HDL:CHD Risk Coronary Heart Disease Risk Table                     Men   Women  1/2 Average Risk   3.4   3.3     Physical Exam:    VS:  BP (!) 157/72 (BP Location: Left Arm, Patient Position: Sitting, Cuff Size: Normal)   Pulse (!) 59   Ht '5\' 8"'$  (1.727 m)   Wt 155 lb 12.8 oz (70.7 kg)   BMI 23.69 kg/m     Wt Readings from Last 3 Encounters:  01/14/21 155 lb 12.8 oz (70.7 kg)  09/30/20 153 lb 6.4 oz (69.6 kg)  08/26/20 154 lb 6.4 oz (70 kg)     GEN: Well nourished, well developed in no acute distress HEENT: Normal NECK: No JVD; No carotid bruits LYMPHATICS: No lymphadenopathy CARDIAC: RRR, no murmurs, rubs, gallops RESPIRATORY:  Clear to auscultation without rales, wheezing or rhonchi  ABDOMEN: Soft, non-tender, non-distended MUSCULOSKELETAL:  No edema; No deformity  SKIN: Warm and dry NEUROLOGIC:  Alert and oriented x 3 PSYCHIATRIC:  Normal affect     ASSESSMENT:    1. Nonrheumatic mitral valve regurgitation   2. Essential hypertension   3. Mixed hyperlipidemia   4. Coronary artery disease involving native coronary artery of native heart without angina pectoris   5. S/P mitral valve clip implantation     PLAN:    In order of problems listed above: Nonrheumatic mitral valve regurgitation Successful MitraClip procedure.  No residual murmur heard on exam.  1+ post MI.  Excellent.   Continue with Plavix monotherapy.  He is off of his aspirin.  No significant shortness of breath.  Essential hypertension Currently well controlled on low-dose metoprolol and low-dose losartan.  No changes made.  Continue with current medical management.  He is on Lasix 40 mg as well.  Most recent creatinine 1.7 from outside labs.  Hyperlipidemia Encourage statin use however he is no longer taking his Crestor.  He stopped his simvastatin previously because of aches.  He does not wish to be on statin therapy.  Continue with diet and exercise.  Last LDL 42 HDL 42.  Excellent.  Coronary artery disease Stent placement to RCA prior to MitraClip.  Overall doing well.  No chest pain.  I think is sharp fleeting left arm pain is likely secondary to musculoskeletal/neurologic reasons.  Nonexertional.  Continue to monitor.  If symptoms worsen or chest pain ensues, further testing may be warranted.  S/P mitral valve clip implantation Doing well     Follow-up: 1 year.     Medication Adjustments/Labs and Tests Ordered: Current medicines are reviewed at length with the patient today.  Concerns regarding medicines are outlined above.   No orders of the defined types were placed in this encounter.  Meds ordered this encounter  Medications   losartan (COZAAR) 25 MG tablet    Sig: Take 1 tablet (25 mg total) by mouth daily.    Dispense:  90 tablet    Refill:  3   nitroGLYCERIN (NITROSTAT) 0.4 MG SL tablet    Sig: PLACE 1 TABLET UNDER THE TONGUE AT ONSET OF CHEST PAIN EVERY 5 MINTUES UP TO 3 TIMES AS NEEDED  Dispense:  25 tablet    Refill:  3    Patient Instructions  Medication Instructions:  Please continue medications as listed.  *If you need a refill on your cardiac medications before your next appointment, please call your pharmacy*  Follow-Up: At [facility], you and your health needs are our priority.  As part of our continuing mission to provide you with exceptional heart care, we  have created designated Provider Care Teams.  These Care Teams include your primary Cardiologist (physician) and Advanced Practice Providers (APPs -  Physician Assistants and Nurse Practitioners) who all work together to provide you with the care you need, when you need it.  We recommend signing up for the patient portal called "MyChart".  Sign up information is provided on this After Visit Summary.  MyChart is used to connect with patients for Virtual Visits (Telemedicine).  Patients are able to view lab/test results, encounter notes, upcoming appointments, etc.  Non-urgent messages can be sent to your provider as well.   To learn more about what you can do with MyChart, go to [email or web address].    Your next appointment:   1 year(s)  The format for your next appointment:   In Person  Provider:   Candee Furbish, MD  Thank you for choosing [facility]!!     I,Mathew Stumpf,acting as a scribe for Candee Furbish, MD.,have documented all relevant documentation on the behalf of Candee Furbish, MD,as directed by  Candee Furbish, MD while in the presence of Candee Furbish, MD.  I, Candee Furbish, MD, have reviewed all documentation for this visit. The documentation on 01/14/21 for the exam, diagnosis, procedures, and orders are all accurate and complete.   Signed, Candee Furbish, MD  01/14/2021 8:28 AM    [facility]

## 2021-01-14 ENCOUNTER — Encounter (INDEPENDENT_AMBULATORY_CARE_PROVIDER_SITE_OTHER): Payer: Self-pay

## 2021-01-14 ENCOUNTER — Ambulatory Visit: Payer: Medicare Other | Admitting: Cardiology

## 2021-01-14 ENCOUNTER — Other Ambulatory Visit: Payer: Self-pay

## 2021-01-14 ENCOUNTER — Encounter: Payer: Self-pay | Admitting: Cardiology

## 2021-01-14 VITALS — BP 157/72 | HR 59 | Ht 68.0 in | Wt 155.8 lb

## 2021-01-14 DIAGNOSIS — Z9889 Other specified postprocedural states: Secondary | ICD-10-CM

## 2021-01-14 DIAGNOSIS — I2510 Atherosclerotic heart disease of native coronary artery without angina pectoris: Secondary | ICD-10-CM

## 2021-01-14 DIAGNOSIS — Z95818 Presence of other cardiac implants and grafts: Secondary | ICD-10-CM | POA: Diagnosis not present

## 2021-01-14 DIAGNOSIS — E782 Mixed hyperlipidemia: Secondary | ICD-10-CM

## 2021-01-14 DIAGNOSIS — I340 Nonrheumatic mitral (valve) insufficiency: Secondary | ICD-10-CM

## 2021-01-14 DIAGNOSIS — I10 Essential (primary) hypertension: Secondary | ICD-10-CM | POA: Diagnosis not present

## 2021-01-14 MED ORDER — LOSARTAN POTASSIUM 25 MG PO TABS: 25.0000 mg | ORAL_TABLET | Freq: Every day | ORAL | 3 refills | Status: DC

## 2021-01-14 MED ORDER — NITROGLYCERIN 0.4 MG SL SUBL: SUBLINGUAL_TABLET | SUBLINGUAL | 3 refills | Status: DC

## 2021-01-14 NOTE — Assessment & Plan Note (Signed)
Stent placement to RCA prior to MitraClip.  Overall doing well.  No chest pain.  I think is sharp fleeting left arm pain is likely secondary to musculoskeletal/neurologic reasons.  Nonexertional.  Continue to monitor.  If symptoms worsen or chest pain ensues, further testing may be warranted.

## 2021-01-14 NOTE — Patient Instructions (Signed)
Medication Instructions:  Please continue medications as listed.  *If you need a refill on your cardiac medications before your next appointment, please call your pharmacy*  Follow-Up: At [facility], you and your health needs are our priority.  As part of our continuing mission to provide you with exceptional heart care, we have created designated Provider Care Teams.  These Care Teams include your primary Cardiologist (physician) and Advanced Practice Providers (APPs -  Physician Assistants and Nurse Practitioners) who all work together to provide you with the care you need, when you need it.  We recommend signing up for the patient portal called "MyChart".  Sign up information is provided on this After Visit Summary.  MyChart is used to connect with patients for Virtual Visits (Telemedicine).  Patients are able to view lab/test results, encounter notes, upcoming appointments, etc.  Non-urgent messages can be sent to your provider as well.   To learn more about what you can do with MyChart, go to [email or web address].    Your next appointment:   1 year(s)  The format for your next appointment:   In Person  Provider:   Candee Furbish, MD  Thank you for choosing [facility]!!

## 2021-01-14 NOTE — Assessment & Plan Note (Signed)
Doing well 

## 2021-01-14 NOTE — Assessment & Plan Note (Signed)
Successful MitraClip procedure.  No residual murmur heard on exam.  1+ post MI.  Excellent.  Continue with Plavix monotherapy.  He is off of his aspirin.  No significant shortness of breath.

## 2021-01-14 NOTE — Assessment & Plan Note (Signed)
Currently well controlled on low-dose metoprolol and low-dose losartan.  No changes made.  Continue with current medical management.  He is on Lasix 40 mg as well.  Most recent creatinine 1.7 from outside labs.

## 2021-01-14 NOTE — Assessment & Plan Note (Signed)
Encourage statin use however he is no longer taking his Crestor.  He stopped his simvastatin previously because of aches.  He does not wish to be on statin therapy.  Continue with diet and exercise.  Last LDL 42 HDL 42.  Excellent.

## 2021-03-25 DIAGNOSIS — H722X2 Other marginal perforations of tympanic membrane, left ear: Secondary | ICD-10-CM | POA: Diagnosis not present

## 2021-03-25 DIAGNOSIS — Z87891 Personal history of nicotine dependence: Secondary | ICD-10-CM | POA: Diagnosis not present

## 2021-03-25 DIAGNOSIS — H9212 Otorrhea, left ear: Secondary | ICD-10-CM | POA: Diagnosis not present

## 2021-04-01 DIAGNOSIS — Z961 Presence of intraocular lens: Secondary | ICD-10-CM | POA: Diagnosis not present

## 2021-04-01 DIAGNOSIS — H04123 Dry eye syndrome of bilateral lacrimal glands: Secondary | ICD-10-CM | POA: Diagnosis not present

## 2021-04-01 DIAGNOSIS — H353131 Nonexudative age-related macular degeneration, bilateral, early dry stage: Secondary | ICD-10-CM | POA: Diagnosis not present

## 2021-04-01 DIAGNOSIS — H40013 Open angle with borderline findings, low risk, bilateral: Secondary | ICD-10-CM | POA: Diagnosis not present

## 2021-05-17 DIAGNOSIS — N1832 Chronic kidney disease, stage 3b: Secondary | ICD-10-CM | POA: Diagnosis not present

## 2021-05-17 DIAGNOSIS — D631 Anemia in chronic kidney disease: Secondary | ICD-10-CM | POA: Diagnosis not present

## 2021-05-17 DIAGNOSIS — I129 Hypertensive chronic kidney disease with stage 1 through stage 4 chronic kidney disease, or unspecified chronic kidney disease: Secondary | ICD-10-CM | POA: Diagnosis not present

## 2021-06-14 ENCOUNTER — Other Ambulatory Visit: Payer: Self-pay | Admitting: Physician Assistant

## 2021-06-14 DIAGNOSIS — I340 Nonrheumatic mitral (valve) insufficiency: Secondary | ICD-10-CM

## 2021-06-14 DIAGNOSIS — Z95818 Presence of other cardiac implants and grafts: Secondary | ICD-10-CM

## 2021-06-14 DIAGNOSIS — Z9889 Other specified postprocedural states: Secondary | ICD-10-CM

## 2021-06-22 DIAGNOSIS — M25511 Pain in right shoulder: Secondary | ICD-10-CM | POA: Diagnosis not present

## 2021-08-02 NOTE — Progress Notes (Signed)
HEART AND [place]                                     Cardiology Office Note:    Date:  08/03/2021   ID:  Adam Arellano, DOB 28-Jan-1932, MRN [id number]  PCP:  Lajean Manes, MD  [facility] Cardiologist:  Candee Furbish, MD  [facility] Electrophysiologist:  None   Referring MD: Lajean Manes, MD   1 year s/p MitraClip  History of Present Illness:    Adam Arellano is a 88 y.o. male with a hx of CAD with NSTEMI s/p multiple PCIs, HTN, HLD, PVCs, prostate CA s/p radical prostatectomy and severe MR s/p TEER  (08/19/2020) who presents to clinic for follow up.    Patient's cardiac history dates back to 2007 when he presented with an acute non-ST segment elevation myocardial infarction. He was treated with PCI and stenting of the distal right coronary artery at that time. In 2011 he had recurrent symptoms of angina and underwent repeat PCI and stenting of the right coronary artery for in-stent restenosis.  He has done well from a cardiac standpoint since that time and has been followed intermittently by Dr. Marlou Porch. Last year, the patient began to develop symptoms of exertional shortness of breath and fatigue.  He was seen by his primary care physician and found to have a new prominent systolic murmur on physical exam.  An echocardiogram was performed demonstrating normal left ventricular systolic function with mitral valve prolapse and severe mitral regurgitation. Patient was seen in follow-up by Dr. Marlou Porch and referred to Dr. Burt Knack for consultation. Transesophageal echocardiogram performed August 06, 2020 confirmed the presence of mitral valve prolapse with a large flail segment involving the middle scallop of the posterior leaflet causing severe mitral regurgitation.  Left ventricular systolic function was mildly reduced with ejection fraction estimated 55 to 60%.  There was severe left atrial enlargement.  No other significant abnormalities  were noted. Diagnostic cardiac catheterization 08/06/20 was performed and revealed severe stenosis of the mid right coronary artery which was successfully treated with PCI and stenting. There was mild nonobstructive disease in the left anterior descending coronary artery and the left circumflex coronary artery with severe stenosis of a medium size intermediate branch. Pulmonary artery pressures were mildly elevated with large V waves consistent with severe mitral regurgitation.     He was evaluated by the multidisciplinary valve team and underwent successful transcatheter edge-to-edge mitral valve repair using a MitraClip XTW device, reducing 4+ mitral regurgitation to 1+, clip positioned A2/P2. Post op echo showed EF 50%, with normally functioning MitaClip with trivial residual mitral valve regurgitation and a mean mitral valve gradient is 3.5 mmHg. He was continued on DAPT with aspirin and Plavix given recent PCI. He did quite well in follow up with less LE edema and dyspnea. 1 month echo showed EF 50% with normally functioning Mitraclip with mild residual MR and mean gradient of 2.2mm hg.  Today he presents to clinic for follow up. He is doing well. He still works Scientist, product/process development. He does get some shortness of breath when he "does too much," but still improved from prior to clip. No CP. Occasional LE edema, but no orthopnea or PND. No dizziness or syncope. No blood in stool or urine. No palpitations.    Past Medical History:  Diagnosis Date   Asthma    a.  Childhood.   Coronary artery disease    a. NSTEMI s/p DES to dRCA 2007. b. Angina 07/2009: s/p DESx2 to mid RCA for ISR and prox RCA, EF 65%. c. Stress test 05/2013: low risk, no evidence of ischemia, EF 61%.   Dyslipidemia    GERD (gastroesophageal reflux disease)    History of kidney stones    Hypertension    Peripheral vascular disease (HCC)    Prostate cancer ([place])    a. s/p radical prostatectomy.   PVC's (premature ventricular contractions)     S/P mitral valve clip implantation 08/19/2020   s/p MitraClip implantation with a XTW by Dr. Burt Knack    Transient global amnesia    a. Adm 2009: imaging nonacute, had resolved.    Past Surgical History:  Procedure Laterality Date   CARDIAC CATHETERIZATION     COLONOSCOPY     CORONARY STENT INTERVENTION N/A 08/06/2020   Procedure: CORONARY STENT INTERVENTION;  Surgeon: Sherren Mocha, MD;  Location: [place] [facility];  Service: Cardiovascular;  Laterality: N/A;   EYE SURGERY Bilateral    cataracts removed   MASTOIDECTOMY     L ear   MITRAL VALVE REPAIR     MITRAL VALVE REPAIR N/A 08/19/2020   Procedure: MITRAL VALVE REPAIR;  Surgeon: Sherren Mocha, MD;  Location: [place] [facility];  Service: Cardiovascular;  Laterality: N/A;   PROSTATECTOMY     RIGHT/LEFT HEART CATH AND CORONARY ANGIOGRAPHY N/A 08/06/2020   Procedure: RIGHT/LEFT HEART CATH AND CORONARY ANGIOGRAPHY;  Surgeon: Sherren Mocha, MD;  Location: [place] [facility];  Service: Cardiovascular;  Laterality: N/A;   TEE WITHOUT CARDIOVERSION N/A 08/06/2020   Procedure: TRANSESOPHAGEAL ECHOCARDIOGRAM (TEE);  Surgeon: Sanda Klein, MD;  Location: [facility];  Service: Cardiovascular;  Laterality: N/A;   TEE WITHOUT CARDIOVERSION  08/19/2020   TEE WITHOUT CARDIOVERSION N/A 08/19/2020   Procedure: TRANSESOPHAGEAL ECHOCARDIOGRAM (TEE);  Surgeon: Sherren Mocha, MD;  Location: [place] [facility];  Service: Cardiovascular;  Laterality: N/A;    Current Medications: Current Meds  Medication Sig   aspirin EC 81 MG tablet Take 81 mg by mouth daily. Swallow whole.   ciprofloxacin-dexamethasone (CIPRODEX) OTIC suspension Place 4 drops into the left ear 2 (two) times daily as needed (ear infection).   FIBER PO Take 1 capsule by mouth daily.   furosemide (LASIX) 40 MG tablet Take 1 tablet (40 mg total) by mouth daily.   losartan (COZAAR) 25 MG tablet Take 1 tablet (25 mg total) by mouth daily.   metoprolol tartrate (LOPRESSOR) 25 MG  tablet Take 25 mg by mouth 2 (two) times daily.   Multiple Vitamin (MULTIVITAMIN WITH MINERALS) TABS tablet Take 1 tablet by mouth daily.   Polyvinyl Alcohol-Povidone (REFRESH OP) Place 1 drop into both eyes daily.   [DISCONTINUED] clopidogrel (PLAVIX) 75 MG tablet TAKE 1 TABLET DAILY   [DISCONTINUED] nitroGLYCERIN (NITROSTAT) 0.4 MG SL tablet PLACE 1 TABLET UNDER THE TONGUE AT ONSET OF CHEST PAIN EVERY 5 MINTUES UP TO 3 TIMES AS NEEDED     Allergies:   Lisinopril and Simvastatin   Social History   Socioeconomic History   Marital status: Married    Spouse name: Not on file   Number of children: Not on file   Years of education: Not on file   Highest education level: Not on file  Occupational History   Not on file  Tobacco Use   Smoking status: Former    Types: Cigarettes    Quit date: 41  Years since quitting: 43.1   Smokeless tobacco: Never   Tobacco comments:    Smoked for 15 years  Vaping Use   Vaping Use: Never used  Substance and Sexual Activity   Alcohol use: Not Currently    Comment: rare   Drug use: No   Sexual activity: Not on file  Other Topics Concern   Not on file  Social History Narrative   Not on file   Social Determinants of Health   Financial Resource Strain: Not on file  Food Insecurity: Not on file  Transportation Needs: Not on file  Physical Activity: Not on file  Stress: Not on file  Social Connections: Not on file     Family History: The patient's family history is negative for CAD.  ROS:   Please see the history of present illness.    All other systems reviewed and are negative.  EKGs/Labs/Other Studies Reviewed:    The following studies were reviewed today: 08/19/2020 MITRAL VALVE REPAIR  Conclusion   Successful transcatheter edge-to-edge mitral valve repair using a MitraClip XTW device, reducing 4+ mitral regurgitation to 1+, clip positioned A2/P2 Recommendations   Antiplatelet/Anticoag Recommend uninterrupted dual  antiplatelet therapy with Aspirin 81mg  daily and Clopidogrel 75mg  daily for 6 months. Recent PCI      _____________     Echo 08/19/2020:  IMPRESSIONS   1. Left ventricular ejection fraction, by estimation, is 50%. The left  ventricle has low normal function. The left ventricle has no regional wall  motion abnormalities. Left ventricular diastolic function could not be  evaluated.   2. Right ventricular systolic function is normal. The right ventricular  size is normal. There is moderately elevated pulmonary artery systolic  pressure.   3. Left atrial size was severely dilated.   4. Right atrial size was mildly dilated.   5. The mitral valve has been repaired/replaced. Trivial mitral valve  regurgitation. No evidence of mitral stenosis. The mean mitral valve  gradient is 3.5 mmHg with average heart rate of 66 bpm. There is a  Mitra-Clip present in the mitral position.  Procedure Date: 08/19/2020.   6. The aortic valve is normal in structure. Aortic valve regurgitation is  not visualized. No aortic stenosis is present.   7. The inferior vena cava is dilated in size with >50% respiratory  variability, suggesting right atrial pressure of 8 mmHg.   8. There is a post-procedural small secundum atrial septal defect with  predominantly left to right shunting across the atrial septum.   _______________________  Echo 09/30/2020 IMPRESSIONS  1. Left ventricular ejection fraction, by estimation, is 50 to 55%. The left ventricle has low normal function. The left ventricle has no regional wall motion abnormalities. Left ventricular diastolic parameters are consistent with Grade I diastolic  dysfunction (impaired relaxation).  2. Right ventricular systolic function is normal. The right ventricular size is normal. There is normal pulmonary artery systolic pressure.  3. Left atrial size was severely dilated.  4. Right atrial size was moderately dilated.  5. The mitral valve has been  repaired/replaced. Mild mitral valve regurgitation. No evidence of mitral stenosis. The mean mitral valve gradient is 2.5 mmHg. There is a Mitra-Clip present in the mitral position. Procedure Date: 08/19/20. Echo findings are  consistent with normal structure and function of the mitral valve prosthesis.  6. Tricuspid valve regurgitation is mild to moderate.  7. The aortic valve is normal in structure. There is mild calcification of the aortic valve. There is mild thickening of the  aortic valve. Aortic valve regurgitation is not visualized. Mild aortic valve sclerosis is present, with no evidence of aortic  valve stenosis.  8. The inferior vena cava is normal in size with greater than 50% respiratory variability, suggesting right atrial pressure of 3 mmHg.  9. Evidence of atrial level shunting detected by color flow Doppler. There is a small secundum atrial septal defect with predominantly left to right shunting across the atrial septum.   Comparison(s): No significant change from prior study  __________________________  Echo 08/03/21 IMPRESSIONS  1. Left ventricular ejection fraction, by estimation, is 60 to 65%. The left ventricle has normal function. The left ventricle has no regional wall motion abnormalities. There is mild left ventricular hypertrophy of the basal-septal segment. Left  ventricular diastolic function could not be evaluated.  2. Right ventricular systolic function is normal. The right ventricular size is normal. There is normal pulmonary artery systolic pressure. The estimated right ventricular systolic pressure is 53.6 mmHg.  3. The mitral valve has been repaired/replaced. Visually there appears to be moderate mitral valve regurgitation but by PISA there is mild MR. There appears to be bowing of the PMVL during systole     MR PISA: MR radius 0.4cm, MR ERO 0.07cm2, MR volume 17cc.     No evidence of mitral stenosis.  4. The aortic valve is tricuspid. There is mild calcification of  the aortic valve. There is mild thickening of the aortic valve. Aortic valve regurgitation is not visualized. Aortic valve sclerosis/calcification is present, without any evidence of  aortic stenosis.  5. The inferior vena cava is normal in size with greater than 50% respiratory variability, suggesting right atrial pressure of 3 mmHg.  6. Side by side images comparing to study dated 09/30/2020, MR appears to have progressed. There appears to be bowing of the PMVL during systole more prominent than prior study. No PISA was done on the prior study. PISA on this study consistent with only  mild MR but visually there is definite increase in MR which is now moderate.  7. Consider TEE if clinically indicated for further evaluation of mitral valve.  EKG:  EKG is NOT ordered today.    Recent Labs: 08/17/2020: ALT 11 08/20/2020: BUN 22; Creatinine, Ser 1.54; Hemoglobin 10.0; Platelets 207; Potassium 3.8; Sodium 140  Recent Lipid Panel    Component Value Date/Time   CHOL  08/27/2007 0415    118        ATP III CLASSIFICATION:  <200     mg/dL   Desirable  200-239  mg/dL   Borderline High  >=240    mg/dL   High   TRIG 53 08/27/2007 0415   HDL 34 (L) 08/27/2007 0415   CHOLHDL 3.5 08/27/2007 0415   VLDL 11 08/27/2007 0415   LDLCALC  08/27/2007 0415    73        Total Cholesterol/HDL:CHD Risk Coronary Heart Disease Risk Table                     Men   Women  1/2 Average Risk   3.4   3.3     Risk Assessment/Calculations:       Physical Exam:    VS:  BP 140/82    Pulse (!) 49    Ht 5\' 5"  (1.651 m)    Wt 156 lb (70.8 kg)    SpO2 100%    BMI 25.96 kg/m     Wt Readings from Last 3 Encounters:  08/03/21  156 lb (70.8 kg)  01/14/21 155 lb 12.8 oz (70.7 kg)  09/30/20 153 lb 6.4 oz (69.6 kg)     GEN:  Well nourished, well developed in no acute distress HEENT: Normal NECK: No JVD; LYMPHATICS: No lymphadenopathy CARDIAC: RRR, 2/6 holosystolic murmur at apex. No rubs, gallops RESPIRATORY:   Clear to auscultation without rales, wheezing or rhonchi  ABDOMEN: Soft, non-tender, non-distended MUSCULOSKELETAL: trace bilateral LE edema; No deformity  SKIN: Warm and dry NEUROLOGIC:  Alert and oriented x 3 PSYCHIATRIC:  Normal affect   ASSESSMENT:    1. S/P mitral valve clip implantation   2. CAD S/P percutaneous coronary angioplasty   3. Essential hypertension     PLAN:    In order of problems listed above:  Severe non rheumatic MR s/p TEER: echo today shows EF 60%, Mitraclip in place with worsening of MR to moderate. There appears to be bowing of the PMVL during systole more prominent than prior study. No PISA was done on the prior study. PISA on this study consistent with only mild MR but visually there is definite increase in MR which is now moderate. Consider TEE if clinically indicated for further evaluation of mitral valve. Thankfully, he has remained clinically stable with NYHA class II symptoms. He still works Scientist, product/process development. He is still on DAPT. Will stop Plavix at this time and continue on a baby aspirin. Will discuss echo with Dr. Burt Knack.    CAD: s/p overlapping DES x2 to distal RCA on 08/06/20.  Completed 1 year of DAPT. Will stop Plavix now and continue on a baby aspirin. Continue statin.    HTN: BP borderline today. No changes made    Medication Adjustments/Labs and Tests Ordered: Current medicines are reviewed at length with the patient today.  Concerns regarding medicines are outlined above.  No orders of the defined types were placed in this encounter.  Meds ordered this encounter  Medications   nitroGLYCERIN (NITROSTAT) 0.4 MG SL tablet    Sig: PLACE 1 TABLET UNDER THE TONGUE AT ONSET OF CHEST PAIN EVERY 5 MINTUES UP TO 3 TIMES AS NEEDED    Dispense:  25 tablet    Refill:  3    Patient Instructions  Medication Instructions:  1) Stop Plavix  2) Continue take Asprin 81 mg daily    *If you need a refill on your cardiac medications before your next appointment,  please call your pharmacy*   Lab Work: None ordered   If you have labs (blood work) drawn today and your tests are completely normal, you will receive your results only by: MyChart Message (if you have MyChart) OR A paper copy in the mail If you have any lab test that is abnormal or we need to change your treatment, we will call you to review the results.   Testing/Procedures: None ordered    Follow-Up: At [facility], you and your health needs are our priority.  As part of our continuing mission to provide you with exceptional heart care, we have created designated Provider Care Teams.  These Care Teams include your primary Cardiologist (physician) and Advanced Practice Providers (APPs -  Physician Assistants and Nurse Practitioners) who all work together to provide you with the care you need, when you need it.  We recommend signing up for the patient portal called "MyChart".  Sign up information is provided on this After Visit Summary.  MyChart is used to connect with patients for Virtual Visits (Telemedicine).  Patients are able to view lab/test  results, encounter notes, upcoming appointments, etc.  Non-urgent messages can be sent to your provider as well.   To learn more about what you can do with MyChart, go to [email or web address].    Your next appointment:   5 month(s)  The format for your next appointment:   In Person  Provider:   Candee Furbish, MD     Other Instructions None     Signed, Manraj Yeo, PA-C  08/03/2021 3:36 PM    [place]

## 2021-08-03 ENCOUNTER — Ambulatory Visit (HOSPITAL_COMMUNITY): Payer: Medicare Other | Attending: Cardiology

## 2021-08-03 ENCOUNTER — Other Ambulatory Visit: Payer: Self-pay

## 2021-08-03 ENCOUNTER — Ambulatory Visit: Payer: Medicare Other | Admitting: Physician Assistant

## 2021-08-03 VITALS — BP 140/82 | HR 49 | Ht 65.0 in | Wt 156.0 lb

## 2021-08-03 DIAGNOSIS — I340 Nonrheumatic mitral (valve) insufficiency: Secondary | ICD-10-CM

## 2021-08-03 DIAGNOSIS — I10 Essential (primary) hypertension: Secondary | ICD-10-CM | POA: Diagnosis not present

## 2021-08-03 DIAGNOSIS — Z9889 Other specified postprocedural states: Secondary | ICD-10-CM | POA: Diagnosis not present

## 2021-08-03 DIAGNOSIS — Z9861 Coronary angioplasty status: Secondary | ICD-10-CM | POA: Diagnosis not present

## 2021-08-03 DIAGNOSIS — Z95818 Presence of other cardiac implants and grafts: Secondary | ICD-10-CM | POA: Diagnosis not present

## 2021-08-03 DIAGNOSIS — I2510 Atherosclerotic heart disease of native coronary artery without angina pectoris: Secondary | ICD-10-CM | POA: Diagnosis not present

## 2021-08-03 DIAGNOSIS — N183 Chronic kidney disease, stage 3 unspecified: Secondary | ICD-10-CM

## 2021-08-03 LAB — ECHOCARDIOGRAM COMPLETE
Area-P 1/2: 1.89 cm2
MV M vel: 5.73 m/s
MV Peak grad: 131.3 mmHg
Radius: 0.4 cm
S' Lateral: 3.6 cm

## 2021-08-03 MED ORDER — NITROGLYCERIN 0.4 MG SL SUBL
SUBLINGUAL_TABLET | SUBLINGUAL | 3 refills | Status: AC
Start: 2021-08-03 — End: ?

## 2021-08-03 NOTE — Patient Instructions (Signed)
Medication Instructions:  ?1) Stop Plavix ? ?2) Continue take Asprin 81 mg daily   ? ?*If you need a refill on your cardiac medications before your next appointment, please call your pharmacy* ? ? ?Lab Work: ?None ordered  ? ?If you have labs (blood work) drawn today and your tests are completely normal, you will receive your results only by: ?MyChart Message (if you have MyChart) OR ?A paper copy in the mail ?If you have any lab test that is abnormal or we need to change your treatment, we will call you to review the results. ? ? ?Testing/Procedures: ?None ordered  ? ? ?Follow-Up: ?At [facility], you and your health needs are our priority.  As part of our continuing mission to provide you with exceptional heart care, we have created designated Provider Care Teams.  These Care Teams include your primary Cardiologist (physician) and Advanced Practice Providers (APPs -  Physician Assistants and Nurse Practitioners) who all work together to provide you with the care you need, when you need it. ? ?We recommend signing up for the patient portal called "MyChart".  Sign up information is provided on this After Visit Summary.  MyChart is used to connect with patients for Virtual Visits (Telemedicine).  Patients are able to view lab/test results, encounter notes, upcoming appointments, etc.  Non-urgent messages can be sent to your provider as well.   ?To learn more about what you can do with MyChart, go to [email or web address].   ? ?Your next appointment:   ?5 month(s) ? ?The format for your next appointment:   ?In Person ? ?Provider:   ?Candee Furbish, MD   ? ? ?Other Instructions ?None   ?

## 2021-08-07 NOTE — Progress Notes (Signed)
Agree. thanks

## 2021-10-10 ENCOUNTER — Other Ambulatory Visit: Payer: Self-pay | Admitting: Cardiology

## 2021-10-14 DIAGNOSIS — H9212 Otorrhea, left ear: Secondary | ICD-10-CM | POA: Diagnosis not present

## 2021-10-14 DIAGNOSIS — H722X2 Other marginal perforations of tympanic membrane, left ear: Secondary | ICD-10-CM | POA: Diagnosis not present

## 2021-10-14 DIAGNOSIS — L929 Granulomatous disorder of the skin and subcutaneous tissue, unspecified: Secondary | ICD-10-CM | POA: Diagnosis not present

## 2021-10-14 DIAGNOSIS — Z87891 Personal history of nicotine dependence: Secondary | ICD-10-CM | POA: Diagnosis not present

## 2021-10-24 DIAGNOSIS — R202 Paresthesia of skin: Secondary | ICD-10-CM | POA: Diagnosis not present

## 2021-10-24 DIAGNOSIS — I951 Orthostatic hypotension: Secondary | ICD-10-CM | POA: Diagnosis not present

## 2021-10-24 DIAGNOSIS — R0609 Other forms of dyspnea: Secondary | ICD-10-CM | POA: Diagnosis not present

## 2021-10-31 NOTE — Progress Notes (Unsigned)
Cardiology Office Note    Date:  11/02/2021   ID:  Adam Arellano, DOB Sep 08, 1931, MRN [id number]  PCP:  Adam Manes, MD  Cardiologist:  Adam Furbish, MD , structural - Dr. Jennefer Bravo PA-C Electrophysiologist:  None   Chief Complaint: fatigue  History of Present Illness:   Adam Arellano is a 88 y.o. male with history of CAD with NSTEMIs/p multiple PCIs, HTN, HLD, PVCs, prostate CA s/p prostatectomy, severe MR s/p TEER 08/2020, CKD 3b and anemia by labs who is seen for evaluation of fatigue.   Patient's cardiac history dates back to 2007 when he presented with an acute non-ST segment elevation myocardial infarction. He was treated with PCI and stenting of the distal right coronary artery at that time. In 2011 he had recurrent symptoms of angina and underwent repeat PCI and stenting of the right coronary artery for in-stent restenosis. In 2022 he developed progressive exertional dyspnea and fatigue with worsening mitral regurgitation. He was seen by the structural heart team. Cath showed findings below with severe stenosis of RCA treated with PCI/DES, with residual disease treated medically. He underwent TEER 08/19/20. Last echo 08/2021 showed EF 60-65%, mild LVH of basal septal segment, normal RV, mild-moderate MR, bowing of PMVL during systole. Adam Arellano reviewed with Dr. Burt Arellano and continued surveillance recommended without plan for repeat TEE.  He is seen for follow-up today at the advisement of his primary care team due to exertional fatigue. He is accompanied by his daughter. For the last 1 month he has noticed less stamina with exertion. He gives the example of walking down to his barn or walking during a family member's graduation. He becomes more tired with exertion and has to stop. This is moreso due to overall sense of "fatigue" rather than exertional dyspnea. He's not had any chest pain. He has chronic LEE which he and daughter state looks fairly normal for him. No  orthopnea, PND, palpitations, syncope, significant weight changes (fluctuates mid 150s-low 160s), fever, night sweats or any other major obvious symptoms. PCP updated labs below 10/2020 with some uptrend in Cr, does not see nephrology presently.    Labwork independently reviewed: [facility] 10/24/2021 BUN 39, Cr 1.920, glucose 95, Hgb 12.1, K 4.3, TSH 2.370 KPN 11/2020 Tchol 105, HDL 42, LDL 42, trig 114 08/2020 K 3.8,  Cr 1.54, Hgb 10, plt wnl, albumin 3.4, AST/ALT   Cardiology Studies:   Studies reviewed are outlined and summarized above. Reports included below if pertinent.   2d echo 08/2021    1. Left ventricular ejection fraction, by estimation, is 60 to 65%. The  left ventricle has normal function. The left ventricle has no regional  wall motion abnormalities. There is mild left ventricular hypertrophy of  the basal-septal segment. Left  ventricular diastolic function could not be evaluated.   2. Right ventricular systolic function is normal. The right ventricular  size is normal. There is normal pulmonary artery systolic pressure. The  estimated right ventricular systolic pressure is 58.8 mmHg.   3. The mitral valve has been repaired/replaced. Visually there appears to  be moderate mitral valve regurgitation but by PISA there is mild MR. There  appears to be bowing of the PMVL during systole      MR PISA: MR radius 0.4cm, MR ERO 0.07cm2, MR volume 17cc.      No evidence of mitral stenosis.   4. The aortic valve is tricuspid. There is mild calcification of the  aortic valve. There is mild thickening  of the aortic valve. Aortic valve  regurgitation is not visualized. Aortic valve sclerosis/calcification is  present, without any evidence of  aortic stenosis.   5. The inferior vena cava is normal in size with greater than 50%  respiratory variability, suggesting right atrial pressure of 3 mmHg.   6. Side by side images comparing to study dated 09/30/2020, MR appears to  have progressed.  There appears to be bowing of the PMVL during systole  more prominent than prior study. No PISA was done on the prior study. PISA  on this study consistent with only   mild MR but visually there is definite increase in MR which is now  moderate.   7. Consider TEE if clinically indicated for further evaluation of mitral  valve.   Cath 08/2020  1.  Patent left mainstem with mild nonobstructive plaquing 2.  Patent LAD with mild diffuse calcific nonobstructive stenosis 3.  Patent circumflex with mild nonobstructive stenosis 4.  Severe stenosis of a moderate caliber ramus intermedius branch, favor medical therapy and an absence of angina 5.  Severe stenosis of a large, dominant RCA, successfully treated with PTCA and stenting using a 3.5 x 22 mm resolute Onyx DES deployed in overlapping fashion with a previously implanted distal RCA stent 6.  Normal LVEDP 7.  Hemodynamics consistent with severe mitral regurgitation with a pulmonary capillary wedge pressure V wave of 45 mmHg, mean wedge pressure 24 mmHg, mean PA pressure 29 mmHg   Recommendations: Aspirin and clopidogrel without interruption x6 months as tolerated Cardiac surgical consultation for evaluation of severe mitral regurgitation, consideration of transcatheter edge-to-edge mitral valve repair in this elderly patient Medical therapy for residual CAD    Past Medical History:  Diagnosis Date   Asthma    a. Childhood.   Coronary artery disease    a. NSTEMI s/p DES to dRCA 2007. b. Angina 07/2009: s/p DESx2 to mid RCA for ISR and prox RCA, EF 65%. c. Stress test 05/2013: low risk, no evidence of ischemia, EF 61%.   Dyslipidemia    GERD (gastroesophageal reflux disease)    History of kidney stones    Hypertension    Peripheral vascular disease (HCC)    Prostate cancer ([place])    a. s/p radical prostatectomy.   PVC's (premature ventricular contractions)    S/P mitral valve clip implantation 08/19/2020   s/p MitraClip implantation with  a XTW by Dr. Burt Arellano    Transient global amnesia    a. Adm 2009: imaging nonacute, had resolved.    Past Surgical History:  Procedure Laterality Date   CARDIAC CATHETERIZATION     COLONOSCOPY     CORONARY STENT INTERVENTION N/A 08/06/2020   Procedure: CORONARY STENT INTERVENTION;  Surgeon: Sherren Mocha, MD;  Location: [facility];  Service: Cardiovascular;  Laterality: N/A;   EYE SURGERY Bilateral    cataracts removed   MASTOIDECTOMY     L ear   MITRAL VALVE REPAIR     MITRAL VALVE REPAIR N/A 08/19/2020   Procedure: MITRAL VALVE REPAIR;  Surgeon: Sherren Mocha, MD;  Location: [facility];  Service: Cardiovascular;  Laterality: N/A;   PROSTATECTOMY     RIGHT/LEFT HEART CATH AND CORONARY ANGIOGRAPHY N/A 08/06/2020   Procedure: RIGHT/LEFT HEART CATH AND CORONARY ANGIOGRAPHY;  Surgeon: Sherren Mocha, MD;  Location: [facility];  Service: Cardiovascular;  Laterality: N/A;   TEE WITHOUT CARDIOVERSION N/A 08/06/2020   Procedure: TRANSESOPHAGEAL ECHOCARDIOGRAM (TEE);  Surgeon: Sanda Klein, MD;  Location: [facility]  ENDOSCOPY;  Service: Cardiovascular;  Laterality: N/A;   TEE WITHOUT CARDIOVERSION  08/19/2020   TEE WITHOUT CARDIOVERSION N/A 08/19/2020   Procedure: TRANSESOPHAGEAL ECHOCARDIOGRAM (TEE);  Surgeon: Sherren Mocha, MD;  Location: [facility];  Service: Cardiovascular;  Laterality: N/A;    Current Medications: Current Meds  Medication Sig   aspirin EC 81 MG tablet Take 81 mg by mouth daily. Swallow whole.   ciprofloxacin-dexamethasone (CIPRODEX) OTIC suspension Place 4 drops into the left ear 2 (two) times daily as needed (ear infection).   FIBER PO Take 1 capsule by mouth daily.   furosemide (LASIX) 40 MG tablet TAKE 1 TABLET DAILY   gabapentin (NEURONTIN) 100 MG capsule Take 1 capsule by mouth daily.   losartan (COZAAR) 25 MG tablet Take 1 tablet (25 mg total) by mouth daily.   metoprolol tartrate (LOPRESSOR) 25 MG tablet Take 25 mg by mouth 2 (two)  times daily.   Multiple Vitamin (MULTIVITAMIN WITH MINERALS) TABS tablet Take 1 tablet by mouth daily.   nitroGLYCERIN (NITROSTAT) 0.4 MG SL tablet PLACE 1 TABLET UNDER THE TONGUE AT ONSET OF CHEST PAIN EVERY 5 MINTUES UP TO 3 TIMES AS NEEDED   Polyvinyl Alcohol-Povidone (REFRESH OP) Place 1 drop into both eyes daily.      Allergies:   Simvastatin, Atorvastatin, Lisinopril, and Rosuvastatin   Social History   Socioeconomic History   Marital status: Married    Spouse name: Not on file   Number of children: Not on file   Years of education: Not on file   Highest education level: Not on file  Occupational History   Not on file  Tobacco Use   Smoking status: Former    Types: Cigarettes    Quit date: 1980    Years since quitting: 43.4   Smokeless tobacco: Never   Tobacco comments:    Smoked for 15 years  Vaping Use   Vaping Use: Never used  Substance and Sexual Activity   Alcohol use: Not Currently    Comment: rare   Drug use: No   Sexual activity: Not on file  Other Topics Concern   Not on file  Social History Narrative   Not on file   Social Determinants of Health   Financial Resource Strain: Not on file  Food Insecurity: Not on file  Transportation Needs: Not on file  Physical Activity: Not on file  Stress: Not on file  Social Connections: Not on file     Family History:  The patient's family history is negative for CAD.  ROS:   Please see the history of present illness.  All other systems are reviewed and otherwise negative.    EKG(s)/Additional Labs   EKG:  EKG is ordered today, personally reviewed, demonstrating NSR 60bpm, one PAC, nonspecific ST changes. No acute change from prior.  Recent Labs: No results found for requested labs within last 8760 hours.  Recent Lipid Panel    Component Value Date/Time   CHOL  08/27/2007 0415    118        ATP III CLASSIFICATION:  <200     mg/dL   Desirable  200-239  mg/dL   Borderline High  >=240    mg/dL    High   TRIG 53 08/27/2007 0415   HDL 34 (L) 08/27/2007 0415   CHOLHDL 3.5 08/27/2007 0415   VLDL 11 08/27/2007 0415   LDLCALC  08/27/2007 0415    73        Total Cholesterol/HDL:CHD Risk Coronary  Heart Disease Risk Table                     Men   Women  1/2 Average Risk   3.4   3.3    PHYSICAL EXAM:    VS:  BP 130/60   Pulse 63   Ht '5\' 7"'$  (1.702 m)   Wt 161 lb (73 kg)   SpO2 94%   BMI 25.22 kg/m   BMI: Body mass index is 25.22 kg/m.  GEN: Well nourished, well developed male in no acute distress HEENT: normocephalic, atraumatic Neck: no JVD, carotid bruits, or masses Cardiac: RRR; soft SEM at apex, no rubs or gallops, trace soft sockline edema  Respiratory:  clear to auscultation bilaterally, normal work of breathing GI: soft, nontender, nondistended, + BS MS: no deformity or atrophy Skin: warm and dry, no rash Neuro:  Alert and Oriented x 3, Strength and sensation are intact, follows commands Psych: euthymic mood, full affect  Wt Readings from Last 3 Encounters:  11/02/21 161 lb (73 kg)  08/03/21 156 lb (70.8 kg)  01/14/21 155 lb 12.8 oz (70.7 kg)     ASSESSMENT & PLAN:   1. Exertional fatigue - etiology not totally clear. No overt dyspnea or chest pain with exertion, moreso sense of decreased stamina. He has mild edema on exam but patient and family state this is chronic. It has been similarly described at last visit. I discussed his case with Adam Range PA-C with the structural heart team given his abnormal echocardiogram in 08/2021 with progression in mitral regurgitation to the moderate Arellano. TEE was deferred at that time. Per our discussion she suggests repeating a complete 2D echo to determine if there have been significant changes from prior - when this results, would involve Katie in the assessment as she said they would plan to review his echo as a structural team. I will also repeat his BMET to trend given uptrending Cr along with BNP. Will also obtain  baseline CXR. If echo is unrevealing, could consider decreasing beta blocker to see if this helps with his stamina as he tends to have baseline sinus bradycardia (currently in the 50s-60s today).  2. CAD with HLD goal LDL <70 - no recent angina. Plavix stopped 08/2021 as he had completed 1 year DAPT. Continue ASA, low dose metoprolol as tolerated. Has h/o statin intolerance per chart. Given advanced age, controlled LDL, and recent symptoms would not push towards alternative therapies at this time.   3. Severe MR s/p TEER with residual MR - plan repeat echo as above. Per notes SBE ppx previously reviewed but patient edentulous so had not been following with dentist. No recent infective symptoms.  4. Essential HTN with h/o orthostasis - BP controlled, no changes made today.  5. CKD stage 3b and anemia by labs - repeat Hgb stable by recent labs. Cr was uptrending however, so we will repeat BMET today.     Disposition: F/u with Adam Range PA-C with structural team after echocardiogram to review.   Medication Adjustments/Labs and Tests Ordered: Current medicines are reviewed at length with the patient today.  Concerns regarding medicines are outlined above. Medication changes, Labs and Tests ordered today are summarized above and listed in the Patient Instructions accessible in Encounters.   Signed, Charlie Pitter, PA-C  11/02/2021 10:53 AM    [place] Phone: [phone]; Fax: [phone]

## 2021-11-02 ENCOUNTER — Ambulatory Visit: Payer: Medicare Other | Admitting: Physician Assistant

## 2021-11-02 ENCOUNTER — Ambulatory Visit
Admission: RE | Admit: 2021-11-02 | Discharge: 2021-11-02 | Disposition: A | Discharge status: Home / Self Care | Payer: Medicare Other | Source: Ambulatory Visit | Attending: Physician Assistant | Admitting: Physician Assistant

## 2021-11-02 ENCOUNTER — Encounter: Payer: Self-pay | Admitting: Physician Assistant

## 2021-11-02 VITALS — BP 130/60 | HR 63 | Ht 67.0 in | Wt 161.0 lb

## 2021-11-02 DIAGNOSIS — N1832 Chronic kidney disease, stage 3b: Secondary | ICD-10-CM | POA: Diagnosis not present

## 2021-11-02 DIAGNOSIS — I10 Essential (primary) hypertension: Secondary | ICD-10-CM

## 2021-11-02 DIAGNOSIS — R509 Fever, unspecified: Secondary | ICD-10-CM | POA: Diagnosis not present

## 2021-11-02 DIAGNOSIS — I2510 Atherosclerotic heart disease of native coronary artery without angina pectoris: Secondary | ICD-10-CM

## 2021-11-02 DIAGNOSIS — R5383 Other fatigue: Secondary | ICD-10-CM

## 2021-11-02 DIAGNOSIS — D649 Anemia, unspecified: Secondary | ICD-10-CM | POA: Diagnosis not present

## 2021-11-02 DIAGNOSIS — E785 Hyperlipidemia, unspecified: Secondary | ICD-10-CM

## 2021-11-02 DIAGNOSIS — Z9889 Other specified postprocedural states: Secondary | ICD-10-CM

## 2021-11-02 DIAGNOSIS — I5032 Chronic diastolic (congestive) heart failure: Secondary | ICD-10-CM | POA: Diagnosis not present

## 2021-11-02 NOTE — Patient Instructions (Addendum)
Medication Instructions:  Your physician recommends that you continue on your current medications as directed. Please refer to the Current Medication list given to you today. *If you need a refill on your cardiac medications before your next appointment, please call your pharmacy*   Lab Work: TODAY-BMET & BNP  If you have labs (blood work) drawn today and your tests are completely normal, you will receive your results only by: [place] (if you have MyChart) OR A paper copy in the mail If you have any lab test that is abnormal or we need to change your treatment, we will call you to review the results.   Testing/Procedures: Your physician has requested that you have an echocardiogram. Echocardiography is a painless test that uses sound waves to create images of your heart. It provides your doctor with information about the size and shape of your heart and how well your heart's chambers and valves are working. This procedure takes approximately one hour. There are no restrictions for this procedure.  A chest x-ray takes a picture of the organs and structures inside the chest, including the heart, lungs, and blood vessels. This test can show several things, including, whether the heart is enlarges; whether fluid is building up in the lungs; and whether pacemaker / defibrillator leads are still in place. [place] IMAG[facility] TAKES WALK-INS FROM 8-5PM    Follow-Up: At [facility], you and your health needs are our priority.  As part of our continuing mission to provide you with exceptional heart care, we have created designated Provider Care Teams.  These Care Teams include your primary Cardiologist (physician) and Advanced Practice Providers (APPs -  Physician Assistants and Nurse Practitioners) who all work together to provide you with the care you need, when you need it.  We recommend signing up for the patient portal called "MyChart".  Sign up information is provided on this After Visit  Summary.  MyChart is used to connect with patients for Virtual Visits (Telemedicine).  Patients are able to view lab/test results, encounter notes, upcoming appointments, etc.  Non-urgent messages can be sent to your provider as well.   To learn more about what you can do with MyChart, go to [email or web address].    Your next appointment:   FIRST AVAILABLE  AFTER ECHO     The format for your next appointment:   In Person  Provider:   [name], PA-C    Other Instructions Endocarditis Information  You may be at risk for developing endocarditis since you have an artificial heart valve or a repaired heart valve. Endocarditis is an infection of the lining of the heart or heart valves. Certain surgical and dental procedures may put you at risk, such as teeth cleaning or other dental procedures or other medical procedures. Notify our office or your dentist before having any dental work or invasive/surgical procedures. You will need to take antibiotics before certain procedures. To prevent endocarditis, maintain good oral health. Seek prompt medical attention for any mouth/gum, skin or urinary tract infections.  Important Information About Sugar

## 2021-11-04 LAB — BASIC METABOLIC PANEL
BUN/Creatinine Ratio: 18 (ref 10–24)
BUN: 34 mg/dL — ABNORMAL HIGH (ref 8–27)
CO2: 24 mmol/L (ref 20–29)
Calcium: 8.9 mg/dL (ref 8.6–10.2)
Chloride: 103 mmol/L (ref 96–106)
Creatinine, Ser: 1.85 mg/dL — ABNORMAL HIGH (ref 0.76–1.27)
Glucose: 95 mg/dL (ref 70–99)
Potassium: 4 mmol/L (ref 3.5–5.2)
Sodium: 141 mmol/L (ref 134–144)
eGFR: 35 mL/min/{1.73_m2} — ABNORMAL LOW (ref >59)

## 2021-11-04 LAB — PRO B NATRIURETIC PEPTIDE: NT-Pro BNP: 1085 pg/mL — ABNORMAL HIGH (ref 0–486)

## 2021-11-08 ENCOUNTER — Encounter (INDEPENDENT_AMBULATORY_CARE_PROVIDER_SITE_OTHER): Payer: Self-pay

## 2021-11-08 DIAGNOSIS — H04123 Dry eye syndrome of bilateral lacrimal glands: Secondary | ICD-10-CM | POA: Diagnosis not present

## 2021-11-08 DIAGNOSIS — H40013 Open angle with borderline findings, low risk, bilateral: Secondary | ICD-10-CM | POA: Diagnosis not present

## 2021-11-08 DIAGNOSIS — H353131 Nonexudative age-related macular degeneration, bilateral, early dry stage: Secondary | ICD-10-CM | POA: Diagnosis not present

## 2021-11-08 DIAGNOSIS — Z961 Presence of intraocular lens: Secondary | ICD-10-CM | POA: Diagnosis not present

## 2021-11-14 ENCOUNTER — Encounter (INDEPENDENT_AMBULATORY_CARE_PROVIDER_SITE_OTHER): Payer: Self-pay | Admitting: Ophthalmology

## 2021-11-14 ENCOUNTER — Ambulatory Visit (INDEPENDENT_AMBULATORY_CARE_PROVIDER_SITE_OTHER): Payer: Medicare Other | Admitting: Ophthalmology

## 2021-11-14 DIAGNOSIS — H353123 Nonexudative age-related macular degeneration, left eye, advanced atrophic without subfoveal involvement: Secondary | ICD-10-CM

## 2021-11-14 DIAGNOSIS — H353221 Exudative age-related macular degeneration, left eye, with active choroidal neovascularization: Secondary | ICD-10-CM | POA: Diagnosis not present

## 2021-11-14 DIAGNOSIS — H353113 Nonexudative age-related macular degeneration, right eye, advanced atrophic without subfoveal involvement: Secondary | ICD-10-CM | POA: Diagnosis not present

## 2021-11-14 MED ORDER — BEVACIZUMAB 2.5 MG/0.1ML IZ SOSY
2.5000 mg | PREFILLED_SYRINGE | INTRAVITREAL | Status: AC | PRN
  Administered 2021-11-14: 2.5 mg via INTRAVITREAL

## 2021-11-14 NOTE — Assessment & Plan Note (Deleted)
The nature of wet macular degeneration was discussed with the patient.  Forms of therapy reviewed include the use of Anti-VEGF medications injected painlessly into the eye, as well as other possible treatment modalities, including thermal laser therapy. Fellow eye involvement and risks were discussed with the patient. Upon the finding of wet age related macular degeneration, treatment will be offered. The treatment regimen is on a treat as needed basis with the intent to treat if necessary and extend interval of exams when possible. On average 1 out of 6 patients do not need lifetime therapy. However, the risk of recurrent disease is high for a lifetime.  Initially monthly, then periodic, examinations and evaluations will determine whether the next treatment is required on the day of the examination.  OS with subretinal hemorrhage and adjacent to the FAZ and subfoveal.  Accounting for acuity.  Risk benefits of treatment into vegF F reviewed.  We will commence with therapy intravitreal Avastin OS today

## 2021-11-14 NOTE — Assessment & Plan Note (Signed)
OD no sign of CNVM 

## 2021-11-14 NOTE — Progress Notes (Signed)
11/14/2021     CHIEF COMPLAINT Patient presents for  Chief Complaint  Patient presents with   Retina Evaluation      HISTORY OF PRESENT ILLNESS: Adam Arellano is a 88 y.o. male who presents to the clinic today for:   HPI     Retina Evaluation           Laterality: left eye   Associated Symptoms: Negative for Flashes, Floaters, Distortion, Pain, Redness, Photophobia and Glare   Context: distance vision, mid-range vision, near vision and reading         Comments   NP- New CNV OS, referred by Dr. Talbert Forest. Patient reports "I can't see out of my left eye. Dr. Talbert Forest said I have blood behind my eyeball and something is affecting my sight." Patient requests Left eye only to be dilated today. Patient is using Refresh Tears OU for dry eyes. Patient is not using any prescription eye drops.      Last edited by Laurin Coder on 11/14/2021  9:43 AM.      Referring physician: Darleen Crocker, MD [address] [place],  [place] [address]  HISTORICAL INFORMATION:   Selected notes from the MEDICAL RECORD NUMBER    Lab Results  Component Value Date   HGBA1C  08/26/2007    5.7 (NOTE)   The ADA recommends the following therapeutic goals for glycemic   control related to Hgb A1C measurement:   Goal of Therapy:   < 7.0% Hgb A1C   Action Suggested:  > 8.0% Hgb A1C   Ref:  Diabetes Care, 22, Suppl. 1, 1999     CURRENT MEDICATIONS: Current Outpatient Medications (Ophthalmic Drugs)  Medication Sig   Polyvinyl Alcohol-Povidone (REFRESH OP) Place 1 drop into both eyes daily.   No current facility-administered medications for this visit. (Ophthalmic Drugs)   Current Outpatient Medications (Other)  Medication Sig   aspirin EC 81 MG tablet Take 81 mg by mouth daily. Swallow whole.   ciprofloxacin-dexamethasone (CIPRODEX) OTIC suspension Place 4 drops into the left ear 2 (two) times daily as needed (ear infection).   FIBER PO Take 1 capsule by mouth daily.   furosemide  (LASIX) 40 MG tablet TAKE 1 TABLET DAILY   gabapentin (NEURONTIN) 100 MG capsule Take 1 capsule by mouth daily.   losartan (COZAAR) 25 MG tablet Take 1 tablet (25 mg total) by mouth daily.   metoprolol tartrate (LOPRESSOR) 25 MG tablet Take 25 mg by mouth 2 (two) times daily.   Multiple Vitamin (MULTIVITAMIN WITH MINERALS) TABS tablet Take 1 tablet by mouth daily.   nitroGLYCERIN (NITROSTAT) 0.4 MG SL tablet PLACE 1 TABLET UNDER THE TONGUE AT ONSET OF CHEST PAIN EVERY 5 MINTUES UP TO 3 TIMES AS NEEDED   No current facility-administered medications for this visit. (Other)      REVIEW OF SYSTEMS:    ALLERGIES Allergies  Allergen Reactions   Simvastatin Other (See Comments)    MUSCLE ACHES REAL BAD   Atorvastatin     Other reaction(s): stiffness, decreased energy   Lisinopril Cough   Rosuvastatin     Other reaction(s): weakness    PAST MEDICAL HISTORY Past Medical History:  Diagnosis Date   Asthma    a. Childhood.   Coronary artery disease    a. NSTEMI s/p DES to dRCA 2007. b. Angina 07/2009: s/p DESx2 to mid RCA for ISR and prox RCA, EF 65%. c. Stress test 05/2013: low risk, no evidence of ischemia, EF  61%.   Dyslipidemia    GERD (gastroesophageal reflux disease)    History of kidney stones    Hypertension    Peripheral vascular disease (HCC)    Prostate cancer ([place])    a. s/p radical prostatectomy.   PVC's (premature ventricular contractions)    S/P mitral valve clip implantation 08/19/2020   s/p MitraClip implantation with a XTW by Dr. Burt Knack    Transient global amnesia    a. Adm 2009: imaging nonacute, had resolved.   Past Surgical History:  Procedure Laterality Date   CARDIAC CATHETERIZATION     COLONOSCOPY     CORONARY STENT INTERVENTION N/A 08/06/2020   Procedure: CORONARY STENT INTERVENTION;  Surgeon: Sherren Mocha, MD;  Location: [facility];  Service: Cardiovascular;  Laterality: N/A;   EYE SURGERY Bilateral    cataracts removed   MASTOIDECTOMY      L ear   MITRAL VALVE REPAIR     MITRAL VALVE REPAIR N/A 08/19/2020   Procedure: MITRAL VALVE REPAIR;  Surgeon: Sherren Mocha, MD;  Location: [facility];  Service: Cardiovascular;  Laterality: N/A;   PROSTATECTOMY     RIGHT/LEFT HEART CATH AND CORONARY ANGIOGRAPHY N/A 08/06/2020   Procedure: RIGHT/LEFT HEART CATH AND CORONARY ANGIOGRAPHY;  Surgeon: Sherren Mocha, MD;  Location: [facility];  Service: Cardiovascular;  Laterality: N/A;   TEE WITHOUT CARDIOVERSION N/A 08/06/2020   Procedure: TRANSESOPHAGEAL ECHOCARDIOGRAM (TEE);  Surgeon: Sanda Klein, MD;  Location: [facility];  Service: Cardiovascular;  Laterality: N/A;   TEE WITHOUT CARDIOVERSION  08/19/2020   TEE WITHOUT CARDIOVERSION N/A 08/19/2020   Procedure: TRANSESOPHAGEAL ECHOCARDIOGRAM (TEE);  Surgeon: Sherren Mocha, MD;  Location: [facility];  Service: Cardiovascular;  Laterality: N/A;    FAMILY HISTORY Family History  Problem Relation Age of Onset   CAD Neg Hx     SOCIAL HISTORY Social History   Tobacco Use   Smoking status: Former    Types: Cigarettes    Quit date: 1980    Years since quitting: 43.4   Smokeless tobacco: Never   Tobacco comments:    Smoked for 15 years  Vaping Use   Vaping Use: Never used  Substance Use Topics   Alcohol use: Not Currently    Comment: rare   Drug use: No         OPHTHALMIC EXAM:  Base Eye Exam     Visual Acuity (ETDRS)       Right Left   Dist [place] 20/40 -2 CF at 3'   Dist ph [place] NI NI         Tonometry (Tonopen, 9:41 AM)       Right Left   Pressure 15 18         Pupils       Dark Light Shape React APD   Right 3 3  Minimal None   Left   Irregular Minimal None         Visual Fields (Counting fingers)       Left Right    Full Full         Extraocular Movement       Right Left    Full Full         Neuro/Psych     Oriented x3: Yes   Mood/Affect: Normal         Dilation     Left eye: 1.0% Mydriacyl, 2.5%  Phenylephrine @ 9:41 AM  Slit Lamp and Fundus Exam     External Exam       Right Left   External Normal Normal         Slit Lamp Exam       Right Left   Lids/Lashes Normal Normal   Conjunctiva/Sclera White and quiet White and quiet   Cornea Clear Clear   Anterior Chamber Deep and quiet Deep and quiet   Iris Round and reactive Round and reactive   Lens Centered posterior chamber intraocular lens Centered posterior chamber intraocular lens   Anterior Vitreous Normal Normal         Fundus Exam       Right Left   C/D Ratio 0.65 0.65   Macula Intermediate age related macular degeneration, Geographic atrophy Intermediate age related macular degeneration, Geographic atrophy, Macular thickening, Mottling, Subretinal hemorrhage nasal to FAZ   Vessels Normal Normal            IMAGING AND PROCEDURES  Imaging and Procedures for 11/14/21  OCT, Retina - OU - Both Eyes       Right Eye Quality was good. Scan locations included subfoveal. Central Foveal Thickness: 291. Progression has no prior data. Findings include abnormal foveal contour, retinal drusen .   Left Eye Quality was good. Scan locations included subfoveal. Progression has no prior data. Findings include abnormal foveal contour, choroidal neovascular membrane, cystoid macular edema.   Notes OS subfoveal CNVM  OD we will continue to monitor inferiorly     Color Fundus Photography Optos - OU - Both Eyes       Right Eye Progression has no prior data. Disc findings include normal observations. Macula : drusen, geographic atrophy. Vessels : normal observations. Periphery : normal observations.   Left Eye Progression has no prior data. Disc findings include normal observations. Macula : drusen, exudates, geographic atrophy. Vessels : normal observations. Periphery : normal observations.   Notes OD perifoveal atrophy.  OS perifoveal atrophy also intraretinal subretinal hemorrhage nasal to  FAZ     Intravitreal Injection, Pharmacologic Agent - OS - Left Eye       Time Out 11/14/2021. 10:36 AM. Confirmed correct patient, procedure, site, and patient consented.   Anesthesia Topical anesthesia was used. Anesthetic medications included Lidocaine 4%.   Procedure Preparation included 5% betadine to ocular surface, 10% betadine to eyelids. A 30 gauge needle was used.   Injection: 2.5 mg bevacizumab 2.5 MG/0.1ML   Route: Intravitreal, Site: Left Eye   NDC: [phone], Lot: [id number], Expiration date: 01/01/2022   Post-op Post injection exam found visual acuity of at least counting fingers. The patient tolerated the procedure well. There were no complications. The patient received written and verbal post procedure care education. Post injection medications included ocuflox.              ASSESSMENT/PLAN:  Advanced nonexudative age-related macular degeneration of right eye without subfoveal involvement OD no sign of CNVM  Exudative age-related macular degeneration of left eye with active choroidal neovascularization (HCC) The nature of wet macular degeneration was discussed with the patient.  Forms of therapy reviewed include the use of Anti-VEGF medications injected painlessly into the eye, as well as other possible treatment modalities, including thermal laser therapy. Fellow eye involvement and risks were discussed with the patient. Upon the finding of wet age related macular degeneration, treatment will be offered. The treatment regimen is on a treat as needed basis with the intent to treat if necessary and extend interval of exams when  possible. On average 1 out of 6 patients do not need lifetime therapy. However, the risk of recurrent disease is high for a lifetime.  Initially monthly, then periodic, examinations and evaluations will determine whether the next treatment is required on the day of the examination.  OS with subretinal hemorrhage and adjacent to the FAZ and  subfoveal.  Accounting for acuity.  Risk benefits of treatment into vegF F reviewed.  We will commence with therapy intravitreal Avastin OS today      ICD-10-CM   1. Exudative age-related macular degeneration of left eye with active choroidal neovascularization (HCC)  H35.3221 OCT, Retina - OU - Both Eyes    Color Fundus Photography Optos - OU - Both Eyes    Intravitreal Injection, Pharmacologic Agent - OS - Left Eye    bevacizumab (AVASTIN) SOSY 2.5 mg    2. Advanced nonexudative age-related macular degeneration of right eye without subfoveal involvement  H35.3113 Color Fundus Photography Optos - OU - Both Eyes    3. Advanced nonexudative age-related macular degeneration of left eye without subfoveal involvement  H35.3123 Color Fundus Photography Optos - OU - Both Eyes      1.  OU with dry AMD will continue to observe  2.  OS new onset vision loss with confirmed wet AMD by clinical examination and OCT.  We will commence therapy promptly OS today with intravitreal Avastin  3.  Ophthalmic Meds Ordered this visit:  Meds ordered this encounter  Medications   bevacizumab (AVASTIN) SOSY 2.5 mg       Return in about 5 weeks (around 12/19/2021) for dilate, OS, AVASTIN OCT.  There are no Patient Instructions on file for this visit.   Explained the diagnoses, plan, and follow up with the patient and they expressed understanding.  Patient expressed understanding of the importance of proper follow up care.   Clent Demark Kaelem Brach M.D. Diseases & Surgery of the Retina and Vitreous [facility] 11/14/21     Abbreviations: M myopia (nearsighted); A astigmatism; H hyperopia (farsighted); P presbyopia; Mrx spectacle prescription;  CTL contact lenses; OD right eye; OS left eye; OU both eyes  XT exotropia; ET esotropia; PEK punctate epithelial keratitis; PEE punctate epithelial erosions; DES dry eye syndrome; MGD meibomian gland dysfunction; ATs artificial tears; PFAT's  preservative free artificial tears; Guys nuclear sclerotic cataract; PSC posterior subcapsular cataract; ERM epi-retinal membrane; PVD posterior vitreous detachment; RD retinal detachment; DM diabetes mellitus; DR diabetic retinopathy; NPDR non-proliferative diabetic retinopathy; PDR proliferative diabetic retinopathy; CSME clinically significant macular edema; DME diabetic macular edema; dbh dot blot hemorrhages; CWS cotton wool spot; POAG primary open angle glaucoma; C/D cup-to-disc ratio; HVF humphrey visual field; GVF goldmann visual field; OCT optical coherence tomography; IOP intraocular pressure; BRVO Branch retinal vein occlusion; CRVO central retinal vein occlusion; CRAO central retinal artery occlusion; BRAO branch retinal artery occlusion; RT retinal tear; SB scleral buckle; PPV pars plana vitrectomy; VH Vitreous hemorrhage; PRP panretinal laser photocoagulation; IVK intravitreal kenalog; VMT vitreomacular traction; MH Macular hole;  NVD neovascularization of the disc; NVE neovascularization elsewhere; AREDS age related eye disease study; ARMD age related macular degeneration; POAG primary open angle glaucoma; EBMD epithelial/anterior basement membrane dystrophy; ACIOL anterior chamber intraocular lens; IOL intraocular lens; PCIOL posterior chamber intraocular lens; Phaco/IOL phacoemulsification with intraocular lens placement; [place] photorefractive keratectomy; LASIK laser assisted in situ keratomileusis; HTN hypertension; DM diabetes mellitus; COPD chronic obstructive pulmonary disease

## 2021-11-14 NOTE — Assessment & Plan Note (Signed)
The nature of wet macular degeneration was discussed with the patient.  Forms of therapy reviewed include the use of Anti-VEGF medications injected painlessly into the eye, as well as other possible treatment modalities, including thermal laser therapy. Fellow eye involvement and risks were discussed with the patient. Upon the finding of wet age related macular degeneration, treatment will be offered. The treatment regimen is on a treat as needed basis with the intent to treat if necessary and extend interval of exams when possible. On average 1 out of 6 patients do not need lifetime therapy. However, the risk of recurrent disease is high for a lifetime.  Initially monthly, then periodic, examinations and evaluations will determine whether the next treatment is required on the day of the examination.  OS with subretinal hemorrhage and adjacent to the FAZ and subfoveal.  Accounting for acuity.  Risk benefits of treatment into vegF F reviewed.  We will commence with therapy intravitreal Avastin OS today

## 2021-11-24 ENCOUNTER — Ambulatory Visit (HOSPITAL_COMMUNITY): Payer: Medicare Other | Attending: Cardiology

## 2021-11-24 ENCOUNTER — Other Ambulatory Visit (HOSPITAL_COMMUNITY): Payer: Self-pay | Admitting: Physician Assistant

## 2021-11-24 DIAGNOSIS — R5383 Other fatigue: Secondary | ICD-10-CM

## 2021-11-24 DIAGNOSIS — I340 Nonrheumatic mitral (valve) insufficiency: Secondary | ICD-10-CM

## 2021-11-24 DIAGNOSIS — I10 Essential (primary) hypertension: Secondary | ICD-10-CM | POA: Diagnosis not present

## 2021-11-24 DIAGNOSIS — E785 Hyperlipidemia, unspecified: Secondary | ICD-10-CM | POA: Insufficient documentation

## 2021-11-24 DIAGNOSIS — I252 Old myocardial infarction: Secondary | ICD-10-CM | POA: Diagnosis not present

## 2021-11-24 DIAGNOSIS — I361 Nonrheumatic tricuspid (valve) insufficiency: Secondary | ICD-10-CM

## 2021-11-24 LAB — ECHOCARDIOGRAM COMPLETE
Area-P 1/2: 2.6 cm2
MV VTI: 1.61 cm2
S' Lateral: 3.5 cm

## 2021-11-25 ENCOUNTER — Ambulatory Visit: Payer: Medicare Other | Admitting: Physician Assistant

## 2021-11-25 VITALS — BP 136/82 | HR 67 | Ht 67.0 in | Wt 160.0 lb

## 2021-11-25 DIAGNOSIS — I2510 Atherosclerotic heart disease of native coronary artery without angina pectoris: Secondary | ICD-10-CM | POA: Diagnosis not present

## 2021-11-25 DIAGNOSIS — Z95818 Presence of other cardiac implants and grafts: Secondary | ICD-10-CM

## 2021-11-25 DIAGNOSIS — Z9889 Other specified postprocedural states: Secondary | ICD-10-CM

## 2021-11-25 DIAGNOSIS — R5383 Other fatigue: Secondary | ICD-10-CM

## 2021-11-25 DIAGNOSIS — I10 Essential (primary) hypertension: Secondary | ICD-10-CM

## 2021-11-25 DIAGNOSIS — M5416 Radiculopathy, lumbar region: Secondary | ICD-10-CM | POA: Diagnosis not present

## 2021-12-05 DIAGNOSIS — Z79899 Other long term (current) drug therapy: Secondary | ICD-10-CM | POA: Diagnosis not present

## 2021-12-05 DIAGNOSIS — I129 Hypertensive chronic kidney disease with stage 1 through stage 4 chronic kidney disease, or unspecified chronic kidney disease: Secondary | ICD-10-CM | POA: Diagnosis not present

## 2021-12-05 DIAGNOSIS — N1832 Chronic kidney disease, stage 3b: Secondary | ICD-10-CM | POA: Diagnosis not present

## 2021-12-05 DIAGNOSIS — Z23 Encounter for immunization: Secondary | ICD-10-CM | POA: Diagnosis not present

## 2021-12-05 DIAGNOSIS — E7800 Pure hypercholesterolemia, unspecified: Secondary | ICD-10-CM | POA: Diagnosis not present

## 2021-12-05 DIAGNOSIS — D631 Anemia in chronic kidney disease: Secondary | ICD-10-CM | POA: Diagnosis not present

## 2021-12-05 DIAGNOSIS — Z0000 Encounter for general adult medical examination without abnormal findings: Secondary | ICD-10-CM | POA: Diagnosis not present

## 2021-12-19 ENCOUNTER — Ambulatory Visit (INDEPENDENT_AMBULATORY_CARE_PROVIDER_SITE_OTHER): Payer: Medicare Other | Admitting: Ophthalmology

## 2021-12-19 ENCOUNTER — Encounter (INDEPENDENT_AMBULATORY_CARE_PROVIDER_SITE_OTHER): Payer: Self-pay | Admitting: Ophthalmology

## 2021-12-19 DIAGNOSIS — H353221 Exudative age-related macular degeneration, left eye, with active choroidal neovascularization: Secondary | ICD-10-CM

## 2021-12-19 MED ORDER — BEVACIZUMAB 2.5 MG/0.1ML IZ SOSY
2.5000 mg | PREFILLED_SYRINGE | INTRAVITREAL | Status: AC | PRN
  Administered 2021-12-19: 2.5 mg via INTRAVITREAL

## 2021-12-19 NOTE — Assessment & Plan Note (Signed)
Post Avastin No. 1 much less thickening much less intraretinal fluid.  Visual acuity limited however by subfoveal white fibrotic scarring and hemorrhage that remains.  Less hemorrhage overall in the macula.  Pete Avastin today reevaluate again in 5 weeks

## 2021-12-19 NOTE — Progress Notes (Signed)
12/19/2021     CHIEF COMPLAINT Patient presents for No chief complaint on file.     HISTORY OF PRESENT ILLNESS: Adam Arellano is a 88 y.o. male who presents to the clinic today for:   HPI   5 weeks, dilate OS, Avastin OCT Pt states his vision has been stable  Pt denies any new floaters or FOL  Last edited by Morene Rankins, CMA on 12/19/2021  9:50 AM.      Referring physician: Lajean Manes, MD [address] [place],  [place] [address]  HISTORICAL INFORMATION:   Selected notes from the MEDICAL RECORD NUMBER    Lab Results  Component Value Date   HGBA1C  08/26/2007    5.7 (NOTE)   The ADA recommends the following therapeutic goals for glycemic   control related to Hgb A1C measurement:   Goal of Therapy:   < 7.0% Hgb A1C   Action Suggested:  > 8.0% Hgb A1C   Ref:  Diabetes Care, 22, Suppl. 1, 1999     CURRENT MEDICATIONS: Current Outpatient Medications (Ophthalmic Drugs)  Medication Sig   Polyvinyl Alcohol-Povidone (REFRESH OP) Place 1 drop into both eyes daily.   No current facility-administered medications for this visit. (Ophthalmic Drugs)   Current Outpatient Medications (Other)  Medication Sig   aspirin EC 81 MG tablet Take 81 mg by mouth daily. Swallow whole.   ciprofloxacin-dexamethasone (CIPRODEX) OTIC suspension Place 4 drops into the left ear 2 (two) times daily as needed (ear infection).   FIBER PO Take 1 capsule by mouth daily.   furosemide (LASIX) 40 MG tablet TAKE 1 TABLET DAILY   losartan (COZAAR) 25 MG tablet Take 1 tablet (25 mg total) by mouth daily.   metoprolol tartrate (LOPRESSOR) 25 MG tablet Take 25 mg by mouth 2 (two) times daily.   Multiple Vitamin (MULTIVITAMIN WITH MINERALS) TABS tablet Take 1 tablet by mouth daily.   nitroGLYCERIN (NITROSTAT) 0.4 MG SL tablet PLACE 1 TABLET UNDER THE TONGUE AT ONSET OF CHEST PAIN EVERY 5 MINTUES UP TO 3 TIMES AS NEEDED   No current facility-administered medications for this visit.  (Other)      REVIEW OF SYSTEMS: ROS   Negative for: Constitutional, Gastrointestinal, Neurological, Skin, Genitourinary, Musculoskeletal, HENT, Endocrine, Cardiovascular, Eyes, Respiratory, Psychiatric, Allergic/Imm, Heme/Lymph Last edited by Morene Rankins, CMA on 12/19/2021  9:50 AM.       ALLERGIES Allergies  Allergen Reactions   Simvastatin Other (See Comments)    MUSCLE ACHES REAL BAD   Atorvastatin     Other reaction(s): stiffness, decreased energy   Lisinopril Cough   Rosuvastatin     Other reaction(s): weakness    PAST MEDICAL HISTORY Past Medical History:  Diagnosis Date   Asthma    a. Childhood.   Coronary artery disease    a. NSTEMI s/p DES to dRCA 2007. b. Angina 07/2009: s/p DESx2 to mid RCA for ISR and prox RCA, EF 65%. c. Stress test 05/2013: low risk, no evidence of ischemia, EF 61%.   Dyslipidemia    GERD (gastroesophageal reflux disease)    History of kidney stones    Hypertension    Peripheral vascular disease (HCC)    Prostate cancer ([place])    a. s/p radical prostatectomy.   PVC's (premature ventricular contractions)    S/P mitral valve clip implantation 08/19/2020   s/p MitraClip implantation with a XTW by Dr. Burt Knack    Transient global amnesia    a. Adm 2009:  imaging nonacute, had resolved.   Past Surgical History:  Procedure Laterality Date   CARDIAC CATHETERIZATION     COLONOSCOPY     CORONARY STENT INTERVENTION N/A 08/06/2020   Procedure: CORONARY STENT INTERVENTION;  Surgeon: Sherren Mocha, MD;  Location: [facility];  Service: Cardiovascular;  Laterality: N/A;   EYE SURGERY Bilateral    cataracts removed   MASTOIDECTOMY     L ear   MITRAL VALVE REPAIR     MITRAL VALVE REPAIR N/A 08/19/2020   Procedure: MITRAL VALVE REPAIR;  Surgeon: Sherren Mocha, MD;  Location: [facility];  Service: Cardiovascular;  Laterality: N/A;   PROSTATECTOMY     RIGHT/LEFT HEART CATH AND CORONARY ANGIOGRAPHY N/A 08/06/2020   Procedure:  RIGHT/LEFT HEART CATH AND CORONARY ANGIOGRAPHY;  Surgeon: Sherren Mocha, MD;  Location: [facility];  Service: Cardiovascular;  Laterality: N/A;   TEE WITHOUT CARDIOVERSION N/A 08/06/2020   Procedure: TRANSESOPHAGEAL ECHOCARDIOGRAM (TEE);  Surgeon: Sanda Klein, MD;  Location[facility];  Service: Cardiovascular;  Laterality: N/A;   TEE WITHOUT CARDIOVERSION  08/19/2020   TEE WITHOUT CARDIOVERSION N/A 08/19/2020   Procedure: TRANSESOPHAGEAL ECHOCARDIOGRAM (TEE);  Surgeon: Sherren Mocha, MD;  Location: [facility];  Service: Cardiovascular;  Laterality: N/A;    FAMILY HISTORY Family History  Problem Relation Age of Onset   CAD Neg Hx     SOCIAL HISTORY Social History   Tobacco Use   Smoking status: Former    Types: Cigarettes    Quit date: 1980    Years since quitting: 43.5   Smokeless tobacco: Never   Tobacco comments:    Smoked for 15 years  Vaping Use   Vaping Use: Never used  Substance Use Topics   Alcohol use: Not Currently    Comment: rare   Drug use: No         OPHTHALMIC EXAM:  Base Eye Exam     Visual Acuity (ETDRS)       Right Left   Dist [place] 20/50 CF at 3'   Dist cc 20/30          Tonometry (Tonopen, 9:55 AM)       Right Left   Pressure 10 11         Pupils       Pupils APD   Right PERRL None   Left PERRL None         Visual Fields       Left Right     Full   Restrictions Partial inner superior temporal, inferior temporal, superior nasal, inferior nasal deficiencies          Neuro/Psych     Oriented x3: Yes   Mood/Affect: Normal         Dilation     Left eye: 2.5% Phenylephrine, 1.0% Mydriacyl @ 9:50 AM           Slit Lamp and Fundus Exam     External Exam       Right Left   External Normal Normal         Slit Lamp Exam       Right Left   Lids/Lashes Normal Normal   Conjunctiva/Sclera White and quiet White and quiet   Cornea Clear Clear   Anterior Chamber Deep and quiet Deep and  quiet   Iris Round and reactive Round and reactive   Lens Centered posterior chamber intraocular lens Centered posterior chamber intraocular lens   Anterior Vitreous Normal  Normal         Fundus Exam       Right Left   Posterior Vitreous  Posterior vitreous detachment   Disc  Normal   C/D Ratio  0.65   Macula  Intermediate age related macular degeneration, Geographic atrophy, Macular thickening, Mottling, Subretinal hemorrhage nasal to FAZ   Vessels  Normal   Periphery  Normal            IMAGING AND PROCEDURES  Imaging and Procedures for 12/19/21  OCT, Retina - OU - Both Eyes       Right Eye Quality was good. Scan locations included subfoveal. Central Foveal Thickness: 294. Progression has been stable. Findings include abnormal foveal contour, retinal drusen .   Left Eye Quality was good. Scan locations included subfoveal. Central Foveal Thickness: 389. Progression has improved. Findings include abnormal foveal contour, choroidal neovascular membrane, cystoid macular edema.   Notes OS subfoveal CNVM  OD we will continue to monitor inferiorly      Intravitreal Injection, Pharmacologic Agent - OS - Left Eye       Time Out 12/19/2021. 10:21 AM. Confirmed correct patient, procedure, site, and patient consented.   Anesthesia Topical anesthesia was used. Anesthetic medications included Lidocaine 4%.   Procedure Preparation included 5% betadine to ocular surface, 10% betadine to eyelids. A 30 gauge needle was used.   Injection: 2.5 mg bevacizumab 2.5 MG/0.1ML   Route: Intravitreal, Site: Left Eye   NDC: [id number], Lot: [id number], Expiration date: 02/03/2022   Post-op Post injection exam found visual acuity of at least counting fingers. The patient tolerated the procedure well. There were no complications. The patient received written and verbal post procedure care education. Post injection medications included ocuflox.               ASSESSMENT/PLAN:  Exudative age-related macular degeneration of left eye with active choroidal neovascularization (HCC) Post Avastin No. 1 much less thickening much less intraretinal fluid.  Visual acuity limited however by subfoveal white fibrotic scarring and hemorrhage that remains.  Less hemorrhage overall in the macula.  Pete Avastin today reevaluate again in 5 weeks     ICD-10-CM   1. Exudative age-related macular degeneration of left eye with active choroidal neovascularization (HCC)  H35.3221 OCT, Retina - OU - Both Eyes    Intravitreal Injection, Pharmacologic Agent - OS - Left Eye    bevacizumab (AVASTIN) SOSY 2.5 mg      1.  OS vastly improved macular findings on OCT and clinically with patient symptomatically improved yet still acuity limited by subfoveal white disciform scar.  Repeat Avastin today at 5-week interval and reevaluate next again in 5 weeks.  2.  3.  Ophthalmic Meds Ordered this visit:  Meds ordered this encounter  Medications   bevacizumab (AVASTIN) SOSY 2.5 mg       Return in about 5 weeks (around 01/23/2022) for dilate, OS, AVASTIN OCT.  There are no Patient Instructions on file for this visit.   Explained the diagnoses, plan, and follow up with the patient and they expressed understanding.  Patient expressed understanding of the importance of proper follow up care.   Clent Demark Dianey Suchy M.D. Diseases & Surgery of the Retina and Vitreous Retina [facility] Diabetic [place] 12/19/21     Abbreviations: M myopia (nearsighted); A astigmatism; H hyperopia (farsighted); P presbyopia; Mrx spectacle prescription;  CTL contact lenses; OD right eye; OS left eye; OU both eyes  XT exotropia; ET esotropia; PEK punctate epithelial keratitis; PEE  punctate epithelial erosions; DES dry eye syndrome; MGD meibomian gland dysfunction; ATs artificial tears; PFAT's preservative free artificial tears; [place] nuclear sclerotic cataract; PSC posterior subcapsular cataract; ERM  epi-retinal membrane; PVD posterior vitreous detachment; RD retinal detachment; DM diabetes mellitus; DR diabetic retinopathy; NPDR non-proliferative diabetic retinopathy; PDR proliferative diabetic retinopathy; CSME clinically significant macular edema; DME diabetic macular edema; dbh dot blot hemorrhages; CWS cotton wool spot; POAG primary open angle glaucoma; C/D cup-to-disc ratio; HVF humphrey visual field; GVF goldmann visual field; OCT optical coherence tomography; IOP intraocular pressure; BRVO Branch retinal vein occlusion; CRVO central retinal vein occlusion; CRAO central retinal artery occlusion; BRAO branch retinal artery occlusion; RT retinal tear; SB scleral buckle; PPV pars plana vitrectomy; VH Vitreous hemorrhage; PRP panretinal laser photocoagulation; IVK intravitreal kenalog; VMT vitreomacular traction; MH Macular hole;  NVD neovascularization of the disc; NVE neovascularization elsewhere; AREDS age related eye disease study; ARMD age related macular degeneration; POAG primary open angle glaucoma; EBMD epithelial/anterior basement membrane dystrophy; ACIOL anterior chamber intraocular lens; IOL intraocular lens; PCIOL posterior chamber intraocular lens; Phaco/IOL phacoemulsification with intraocular lens placement; [place] photorefractive keratectomy; LASIK laser assisted in situ keratomileusis; HTN hypertension; DM diabetes mellitus; COPD chronic obstructive pulmonary disease

## 2022-01-08 IMAGING — CR DG CHEST 2V
2 series · 2 of 2 positions shown · non-contrast
Comparison: 05/16/2013

CLINICAL DATA: Preop mitral valve surgery

EXAM:
CHEST - 2 VIEW

[w chest pa]
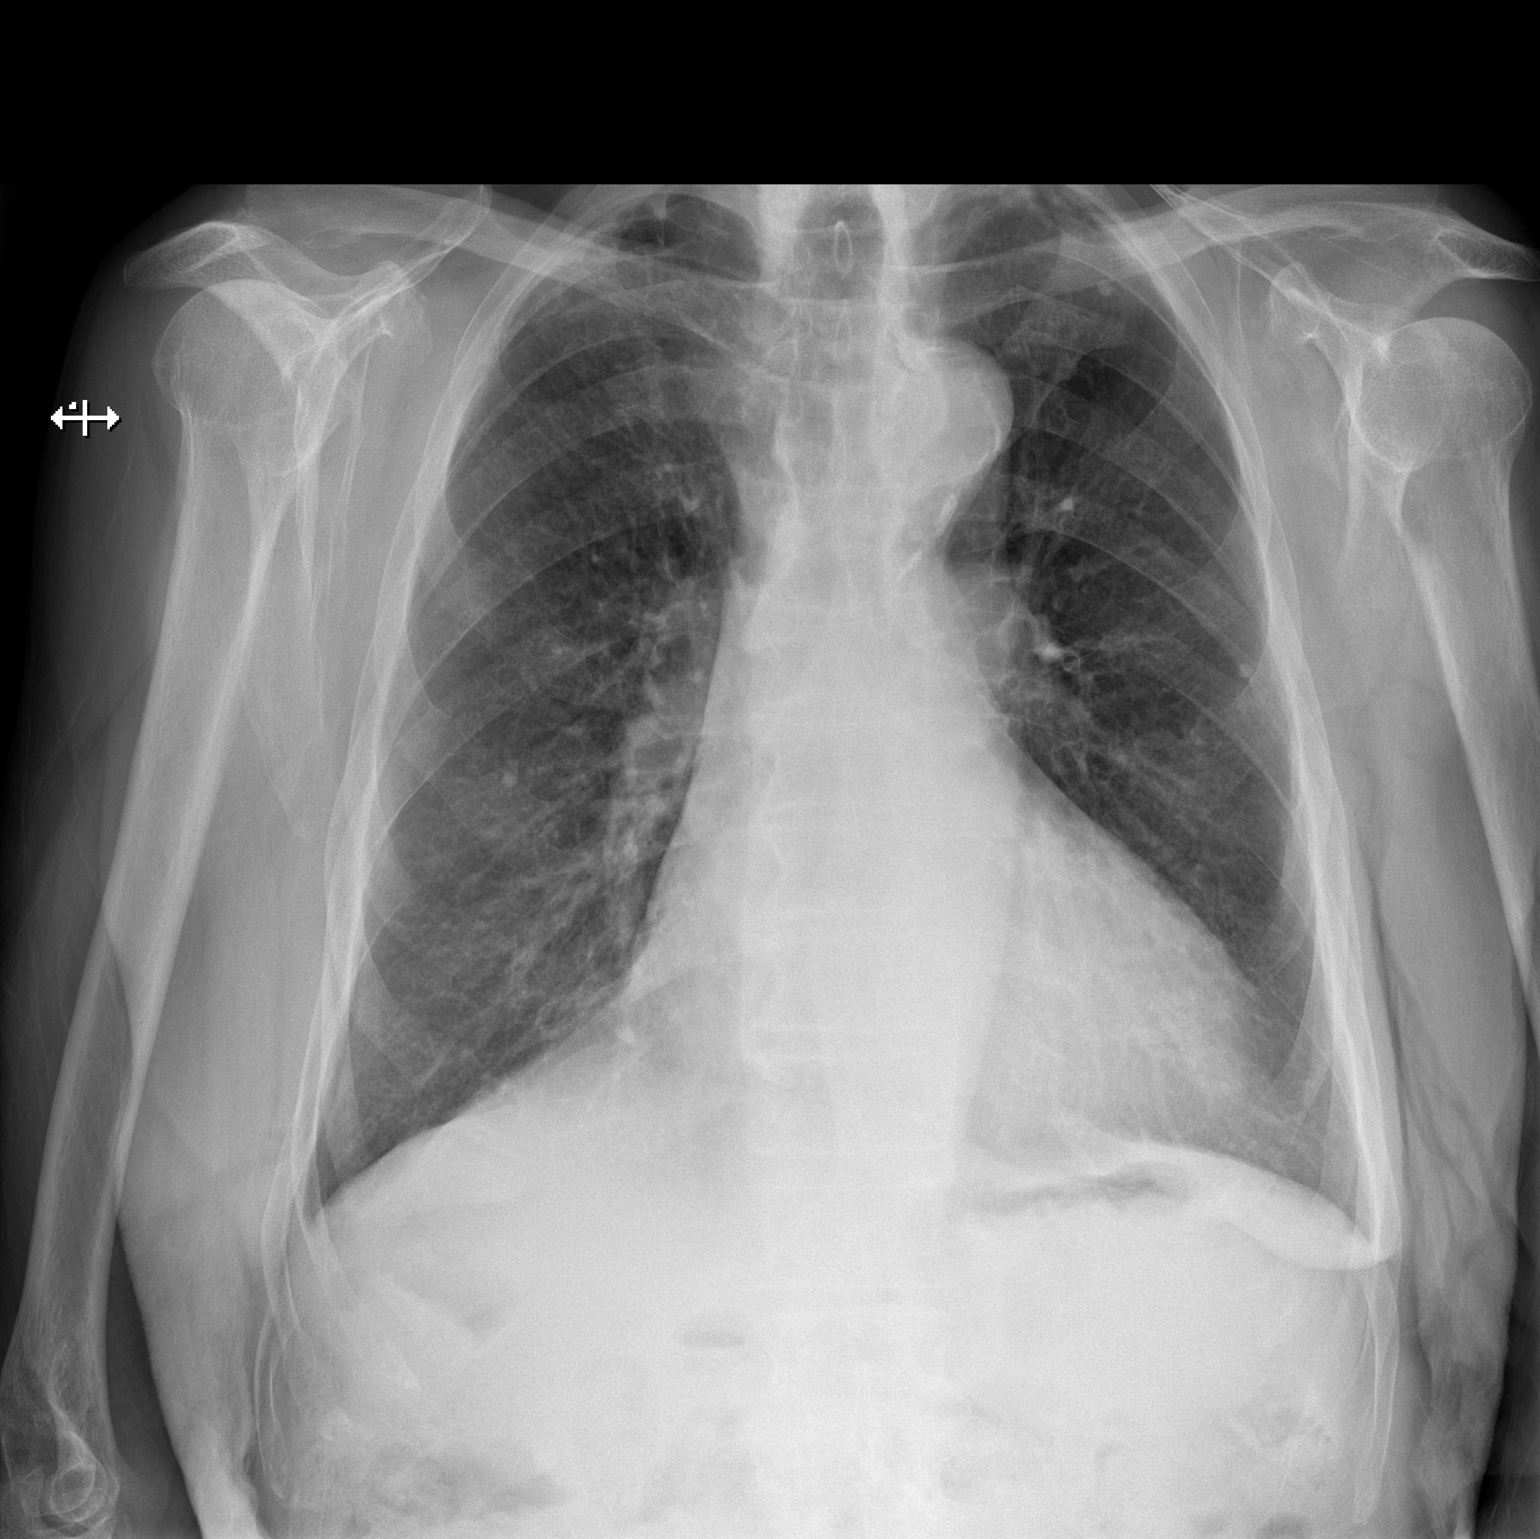

[w chest lat]
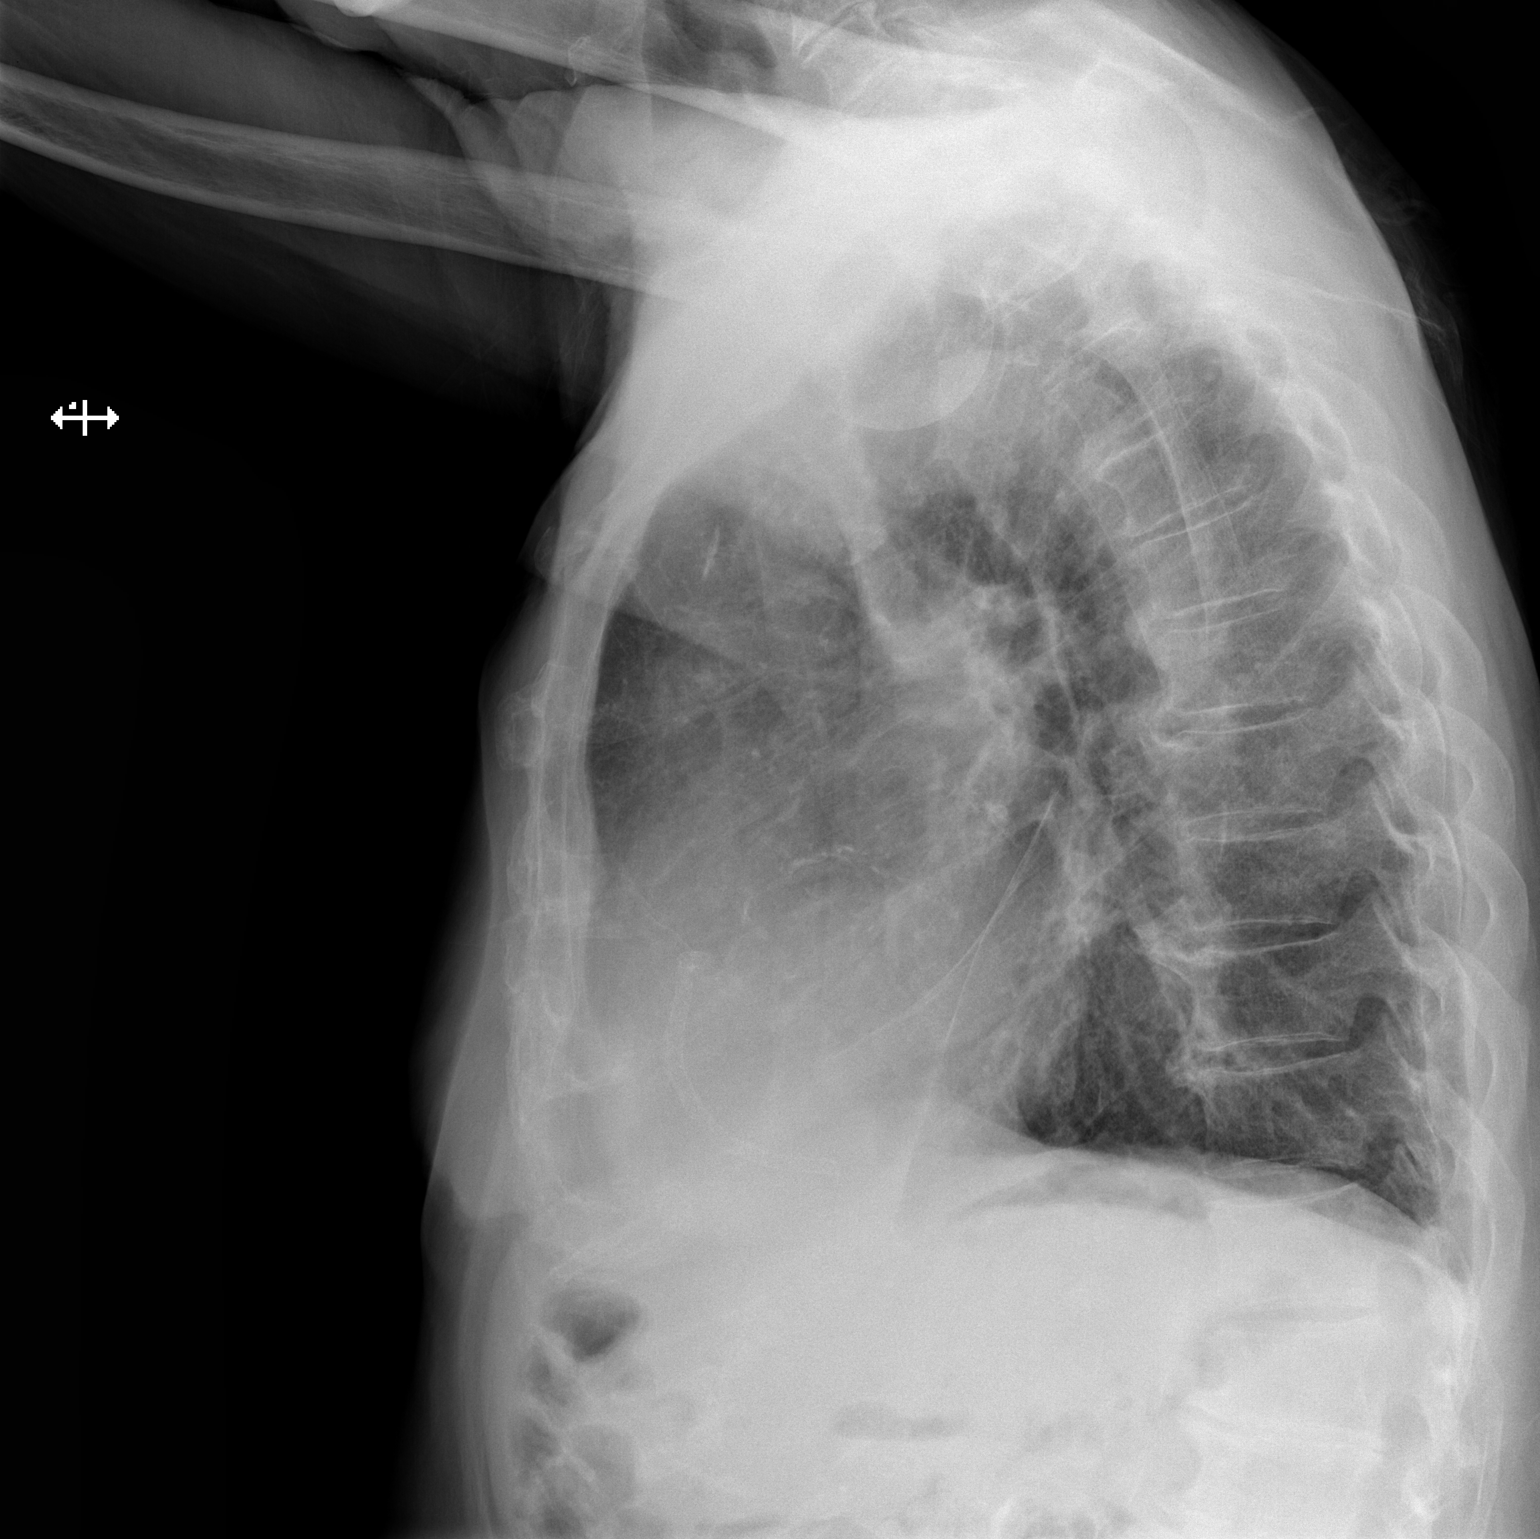

[2 of 2 positions shown; findings below may reference images not displayed]

FINDINGS: Heart size mildly enlarged. Vascularity normal. Atherosclerotic
calcification aortic arch. Right coronary stent.

Lungs are clear without infiltrate or effusion. Apical pleural
scarring bilaterally.
IMPRESSION: No active cardiopulmonary disease.

## 2022-01-16 ENCOUNTER — Other Ambulatory Visit: Payer: Self-pay

## 2022-01-16 ENCOUNTER — Other Ambulatory Visit: Payer: Self-pay | Admitting: Cardiology

## 2022-01-16 DIAGNOSIS — I059 Rheumatic mitral valve disease, unspecified: Secondary | ICD-10-CM | POA: Insufficient documentation

## 2022-01-16 DIAGNOSIS — R202 Paresthesia of skin: Secondary | ICD-10-CM | POA: Insufficient documentation

## 2022-01-19 ENCOUNTER — Ambulatory Visit (INDEPENDENT_AMBULATORY_CARE_PROVIDER_SITE_OTHER): Payer: Medicare Other | Admitting: Ophthalmology

## 2022-01-19 ENCOUNTER — Encounter (INDEPENDENT_AMBULATORY_CARE_PROVIDER_SITE_OTHER): Payer: Self-pay | Admitting: Ophthalmology

## 2022-01-19 DIAGNOSIS — H353221 Exudative age-related macular degeneration, left eye, with active choroidal neovascularization: Secondary | ICD-10-CM

## 2022-01-19 DIAGNOSIS — H353113 Nonexudative age-related macular degeneration, right eye, advanced atrophic without subfoveal involvement: Secondary | ICD-10-CM | POA: Diagnosis not present

## 2022-01-19 MED ORDER — BEVACIZUMAB CHEMO INJECTION 1.25MG/0.05ML SYRINGE FOR KALEIDOSCOPE
1.2500 mg | INTRAVITREAL | Status: AC | PRN
  Administered 2022-01-19: 1.25 mg via INTRAVITREAL

## 2022-01-19 NOTE — Assessment & Plan Note (Signed)
No sign of CNVM OD 

## 2022-01-19 NOTE — Progress Notes (Signed)
01/19/2022     CHIEF COMPLAINT Patient presents for  Chief Complaint  Patient presents with   Macular Degeneration      HISTORY OF PRESENT ILLNESS: Adam Arellano is a 88 y.o. male who presents to the clinic today for:   HPI   4 weeks for DILATE OS, AVASTIN OCT. Pt stated vision in the left eye has improved since last visit.     Last edited by Hurman Horn, MD on 01/19/2022 10:32 AM.      Referring physician: Lajean Manes, [address] [place],  [place] [address]  HISTORICAL INFORMATION:   Selected notes from the MEDICAL RECORD NUMBER    Lab Results  Component Value Date   HGBA1C  08/26/2007    5.7 (NOTE)   The ADA recommends the following therapeutic goals for glycemic   control related to Hgb A1C measurement:   Goal of Therapy:   < 7.0% Hgb A1C   Action Suggested:  > 8.0% Hgb A1C   Ref:  Diabetes Care, 22, Suppl. 1, 1999     CURRENT MEDICATIONS: Current Outpatient Medications (Ophthalmic Drugs)  Medication Sig   Polyvinyl Alcohol-Povidone (REFRESH OP) Place 1 drop into both eyes daily.   No current facility-administered medications for this visit. (Ophthalmic Drugs)   Current Outpatient Medications (Other)  Medication Sig   aspirin EC 81 MG tablet Take 81 mg by mouth daily. Swallow whole.   ciprofloxacin-dexamethasone (CIPRODEX) OTIC suspension Place 4 drops into the left ear 2 (two) times daily as needed (ear infection).   FIBER PO Take 1 capsule by mouth daily.   furosemide (LASIX) 40 MG tablet TAKE 1 TABLET DAILY   gabapentin (NEURONTIN) 100 MG capsule Take 100 mg by mouth at bedtime.   losartan (COZAAR) 25 MG tablet TAKE 1 TABLET DAILY   metoprolol tartrate (LOPRESSOR) 25 MG tablet Take 25 mg by mouth 2 (two) times daily.   Multiple Vitamin (MULTIVITAMIN WITH MINERALS) TABS tablet Take 1 tablet by mouth daily.   nitroGLYCERIN (NITROSTAT) 0.4 MG SL tablet PLACE 1 TABLET UNDER THE TONGUE AT ONSET OF CHEST PAIN EVERY 5 MINTUES UP TO  3 TIMES AS NEEDED   No current facility-administered medications for this visit. (Other)      REVIEW OF SYSTEMS: ROS   Negative for: Constitutional, Gastrointestinal, Neurological, Skin, Genitourinary, Musculoskeletal, HENT, Endocrine, Cardiovascular, Eyes, Respiratory, Psychiatric, Allergic/Imm, Heme/Lymph Last edited by Silvestre Moment on 01/19/2022  9:53 AM.       ALLERGIES Allergies  Allergen Reactions   Simvastatin Other (See Comments)    MUSCLE ACHES REAL BAD   Atorvastatin     Other reaction(s): stiffness, decreased energy   Lisinopril Cough   Rosuvastatin     Other reaction(s): weakness    PAST MEDICAL HISTORY Past Medical History:  Diagnosis Date   Asthma    a. Childhood.   Coronary artery disease    a. NSTEMI s/p DES to dRCA 2007. b. Angina 07/2009: s/p DESx2 to mid RCA for ISR and prox RCA, EF 65%. c. Stress test 05/2013: low risk, no evidence of ischemia, EF 61%.   Dyslipidemia    GERD (gastroesophageal reflux disease)    History of kidney stones    Hypertension    Peripheral vascular disease (HCC)    Prostate cancer ([place])    a. s/p radical prostatectomy.   PVC's (premature ventricular contractions)    S/P mitral valve clip implantation 08/19/2020   s/p MitraClip implantation with a XTW by  Dr. Burt Knack    Transient global amnesia    a. Adm 2009: imaging nonacute, had resolved.   Past Surgical History:  Procedure Laterality Date   CARDIAC CATHETERIZATION     COLONOSCOPY     CORONARY STENT INTERVENTION N/A 08/06/2020   Procedure: CORONARY STENT INTERVENTION;  Surgeon: Sherren Mocha, MD;  Location: [facility];  Service: Cardiovascular;  Laterality: N/A;   EYE SURGERY Bilateral    cataracts removed   MASTOIDECTOMY     L ear   MITRAL VALVE REPAIR     MITRAL VALVE REPAIR N/A 08/19/2020   Procedure: MITRAL VALVE REPAIR;  Surgeon: Sherren Mocha, MD;  Location: [facility];  Service: Cardiovascular;  Laterality: N/A;   PROSTATECTOMY     RIGHT/LEFT  HEART CATH AND CORONARY ANGIOGRAPHY N/A 08/06/2020   Procedure: RIGHT/LEFT HEART CATH AND CORONARY ANGIOGRAPHY;  Surgeon: Sherren Mocha, MD;  Location: [facility];  Service: Cardiovascular;  Laterality: N/A;   TEE WITHOUT CARDIOVERSION N/A 08/06/2020   Procedure: TRANSESOPHAGEAL ECHOCARDIOGRAM (TEE);  Surgeon: Sanda Klein, MD;  Location: [facility];  Service: Cardiovascular;  Laterality: N/A;   TEE WITHOUT CARDIOVERSION  08/19/2020   TEE WITHOUT CARDIOVERSION N/A 08/19/2020   Procedure: TRANSESOPHAGEAL ECHOCARDIOGRAM (TEE);  Surgeon: Sherren Mocha, MD;  Location: [facility];  Service: Cardiovascular;  Laterality: N/A;    FAMILY HISTORY Family History  Problem Relation Age of Onset   CAD Neg Hx     SOCIAL HISTORY Social History   Tobacco Use   Smoking status: Former    Types: Cigarettes    Quit date: 1980    Years since quitting: 43.6   Smokeless tobacco: Never   Tobacco comments:    Smoked for 15 years  Vaping Use   Vaping Use: Never used  Substance Use Topics   Alcohol use: Not Currently    Comment: rare   Drug use: No         OPHTHALMIC EXAM:  Base Eye Exam     Visual Acuity (ETDRS)       Right Left   Dist cc 20/25 -2 CF at 3'    Correction: Glasses         Tonometry (Tonopen, 9:57 AM)       Right Left   Pressure 14 15         Pupils       Pupils APD   Right PERRL None   Left PERRL None         Visual Fields       Left Right     Full   Restrictions Partial inner superior temporal, inferior temporal, superior nasal, inferior nasal deficiencies          Extraocular Movement       Right Left    Full Full         Neuro/Psych     Oriented x3: Yes   Mood/Affect: Normal         Dilation     Left eye: 2.5% Phenylephrine, 1.0% Mydriacyl @ 9:57 AM           Slit Lamp and Fundus Exam     External Exam       Right Left   External Normal Normal         Slit Lamp Exam       Right Left    Lids/Lashes Normal Normal   Conjunctiva/Sclera White and quiet White and quiet   Cornea Clear Clear  Anterior Chamber Deep and quiet Deep and quiet   Iris Round and reactive Round and reactive   Lens Centered posterior chamber intraocular lens Centered posterior chamber intraocular lens   Anterior Vitreous Normal Normal         Fundus Exam       Right Left   Posterior Vitreous  Posterior vitreous detachment   Disc  Normal   C/D Ratio  0.65   Macula  Intermediate age related macular degeneration, Geographic atrophy, Macular thickening, Mottling, Subretinal hemorrhage nasal to FAZ   Vessels  Normal   Periphery  Normal            IMAGING AND PROCEDURES  Imaging and Procedures for 01/19/22  OCT, Retina - OU - Both Eyes       Right Eye Quality was good. Scan locations included subfoveal. Central Foveal Thickness: 294. Progression has been stable. Findings include abnormal foveal contour, retinal drusen .   Left Eye Quality was good. Scan locations included subfoveal. Central Foveal Thickness: 301. Progression has improved. Findings include abnormal foveal contour, choroidal neovascular membrane, cystoid macular edema, disciform scar.   Notes OS subfoveal CNVM improving macular anatomy much less thickening.  Subfoveal disciform will limit acuity  OD we will continue to monitor inferiorly     Intravitreal Injection, Pharmacologic Agent - OS - Left Eye       Time Out 01/19/2022. 10:35 AM. Confirmed correct patient, procedure, site, and patient consented.   Anesthesia Topical anesthesia was used. Anesthetic medications included Lidocaine 4%.   Procedure Preparation included 5% betadine to ocular surface, 10% betadine to eyelids. A 30 gauge needle was used.   Injection: 1.25 mg Bevacizumab 1.'25mg'$ /0.36m   Route: Intravitreal, Site: Left Eye   NDC: [id number] Lot:: [id number] Expiration date: 04/19/2022   Post-op Post injection exam found visual acuity of at least  counting fingers. The patient tolerated the procedure well. There were no complications. The patient received written and verbal post procedure care education. Post injection medications included ocuflox.              ASSESSMENT/PLAN:  Exudative age-related macular degeneration of left eye with active choroidal neovascularization (HCC) The nature of wet macular degeneration was discussed with the patient.  Forms of therapy reviewed include the use of Anti-VEGF medications injected painlessly into the eye, as well as other possible treatment modalities, including thermal laser therapy. Fellow eye involvement and risks were discussed with the patient. Upon the finding of wet age related macular degeneration, treatment will be offered. The treatment regimen is on a treat as needed basis with the intent to treat if necessary and extend interval of exams when possible. On average 1 out of 6 patients do not need lifetime therapy. However, the risk of recurrent disease is high for a lifetime.  Initially monthly, then periodic, examinations and evaluations will determine whether the next treatment is required on the day of the examination.  OS improving subfoveal disease.  Scotoma smaller.  Patient noticing a visual acuity improvement.  At 5-week interval.  Still with subretinal blood OS.  We will repeat injection today and follow-up in 5 weeks again.  Advanced nonexudative age-related macular degeneration of right eye without subfoveal involvement No sign of CNVM OD      ICD-10-CM   1. Exudative age-related macular degeneration of left eye with active choroidal neovascularization (HCC)  H35.3221 OCT, Retina - OU - Both Eyes    Intravitreal Injection, Pharmacologic Agent - OS - Left Eye  Bevacizumab (AVASTIN) SOLN 1.25 mg    2. Advanced nonexudative age-related macular degeneration of right eye without subfoveal involvement  H35.3113       1.  OS, improving.  Repeat injection today reevaluate  next in 5 weeks  2.  3.  Ophthalmic Meds Ordered this visit:  Meds ordered this encounter  Medications   Bevacizumab (AVASTIN) SOLN 1.25 mg       Return in about 5 weeks (around 02/23/2022) for dilate, OS, AVASTIN OCT.  There are no Patient Instructions on file for this visit.   Explained the diagnoses, plan, and follow up with the patient and they expressed understanding.  Patient expressed understanding of the importance of proper follow up care.   Clent Demark Falesha Schommer M.D. Diseases & Surgery of the Retina and Vitreous Retina & Diabetic [place] 01/19/22     Abbreviations: M myopia (nearsighted); A astigmatism; H hyperopia (farsighted); P presbyopia; Mrx spectacle prescription;  CTL contact lenses; OD right eye; OS left eye; OU both eyes  XT exotropia; ET esotropia; PEK punctate epithelial keratitis; PEE punctate epithelial erosions; DES dry eye syndrome; MGD meibomian gland dysfunction; ATs artificial tears; PFAT's preservative free artificial tears; [place] nuclear sclerotic cataract; PSC posterior subcapsular cataract; ERM epi-retinal membrane; PVD posterior vitreous detachment; RD retinal detachment; DM diabetes mellitus; DR diabetic retinopathy; NPDR non-proliferative diabetic retinopathy; PDR proliferative diabetic retinopathy; CSME clinically significant macular edema; DME diabetic macular edema; dbh dot blot hemorrhages; CWS cotton wool spot; POAG primary open angle glaucoma; C/D cup-to-disc ratio; HVF humphrey visual field; GVF goldmann visual field; OCT optical coherence tomography; IOP intraocular pressure; BRVO Branch retinal vein occlusion; CRVO central retinal vein occlusion; CRAO central retinal artery occlusion; BRAO branch retinal artery occlusion; RT retinal tear; SB scleral buckle; PPV pars plana vitrectomy; VH Vitreous hemorrhage; PRP panretinal laser photocoagulation; IVK intravitreal kenalog; VMT vitreomacular traction; MH Macular hole;  NVD neovascularization of the  disc; NVE neovascularization elsewhere; AREDS age related eye disease study; ARMD age related macular degeneration; POAG primary open angle glaucoma; EBMD epithelial/anterior basement membrane dystrophy; ACIOL anterior chamber intraocular lens; IOL intraocular lens; PCIOL posterior chamber intraocular lens; Phaco/IOL phacoemulsification with intraocular lens placement; [place] photorefractive keratectomy; LASIK laser assisted in situ keratomileusis; HTN hypertension; DM diabetes mellitus; COPD chronic obstructive pulmonary disease

## 2022-01-19 NOTE — Assessment & Plan Note (Signed)
The nature of wet macular degeneration was discussed with the patient.  Forms of therapy reviewed include the use of Anti-VEGF medications injected painlessly into the eye, as well as other possible treatment modalities, including thermal laser therapy. Fellow eye involvement and risks were discussed with the patient. Upon the finding of wet age related macular degeneration, treatment will be offered. The treatment regimen is on a treat as needed basis with the intent to treat if necessary and extend interval of exams when possible. On average 1 out of 6 patients do not need lifetime therapy. However, the risk of recurrent disease is high for a lifetime.  Initially monthly, then periodic, examinations and evaluations will determine whether the next treatment is required on the day of the examination.  OS improving subfoveal disease.  Scotoma smaller.  Patient noticing a visual acuity improvement.  At 5-week interval.  Still with subretinal blood OS.  We will repeat injection today and follow-up in 5 weeks again.

## 2022-02-23 ENCOUNTER — Ambulatory Visit (INDEPENDENT_AMBULATORY_CARE_PROVIDER_SITE_OTHER): Payer: Medicare Other | Admitting: Ophthalmology

## 2022-02-23 ENCOUNTER — Encounter (INDEPENDENT_AMBULATORY_CARE_PROVIDER_SITE_OTHER): Payer: Self-pay | Admitting: Ophthalmology

## 2022-02-23 DIAGNOSIS — H353221 Exudative age-related macular degeneration, left eye, with active choroidal neovascularization: Secondary | ICD-10-CM | POA: Diagnosis not present

## 2022-02-23 DIAGNOSIS — H353113 Nonexudative age-related macular degeneration, right eye, advanced atrophic without subfoveal involvement: Secondary | ICD-10-CM | POA: Diagnosis not present

## 2022-02-23 MED ORDER — BEVACIZUMAB CHEMO INJECTION 1.25MG/0.05ML SYRINGE FOR KALEIDOSCOPE
2.5000 mg | INTRAVITREAL | Status: AC | PRN
  Administered 2022-02-23: 2.5 mg via INTRAVITREAL

## 2022-02-23 NOTE — Assessment & Plan Note (Signed)
OS doing well.  Vastly improved overall.  At 8-week interval today.  Repeat injection today and extend interval examination next to 10 minutes.  Acuity limited by subfoveal scarring

## 2022-02-23 NOTE — Patient Instructions (Signed)
Patient and family instructed to contact the office 1 to 2 weeks prior to next visit to confirm the patient and his current insurance plan  We will allow for transfers of care if required.

## 2022-02-23 NOTE — Progress Notes (Signed)
02/23/2022     CHIEF COMPLAINT Patient presents for  Chief Complaint  Patient presents with   Macular Degeneration      HISTORY OF PRESENT ILLNESS: Adam Arellano is a 88 y.o. male who presents to the clinic today for:   HPI   Age related macular degeneration of left eye  8 weeks dilate os avastin oct Pt states his vision has been stable Pt denies any new floaters or FOL Last edited by Morene Rankins, CMA on 02/23/2022 10:24 AM.      Referring physician: Lajean Manes, MD [address] [place],  [place] [address]  HISTORICAL INFORMATION:   Selected notes from the MEDICAL RECORD NUMBER    Lab Results  Component Value Date   HGBA1C  08/26/2007    5.7 (NOTE)   The ADA recommends the following therapeutic goals for glycemic   control related to Hgb A1C measurement:   Goal of Therapy:   < 7.0% Hgb A1C   Action Suggested:  > 8.0% Hgb A1C   Ref:  Diabetes Care, 22, Suppl. 1, 1999     CURRENT MEDICATIONS: Current Outpatient Medications (Ophthalmic Drugs)  Medication Sig   Polyvinyl Alcohol-Povidone (REFRESH OP) Place 1 drop into both eyes daily.   No current facility-administered medications for this visit. (Ophthalmic Drugs)   Current Outpatient Medications (Other)  Medication Sig   aspirin EC 81 MG tablet Take 81 mg by mouth daily. Swallow whole.   ciprofloxacin-dexamethasone (CIPRODEX) OTIC suspension Place 4 drops into the left ear 2 (two) times daily as needed (ear infection).   FIBER PO Take 1 capsule by mouth daily.   furosemide (LASIX) 40 MG tablet TAKE 1 TABLET DAILY   gabapentin (NEURONTIN) 100 MG capsule Take 100 mg by mouth at bedtime.   losartan (COZAAR) 25 MG tablet TAKE 1 TABLET DAILY   metoprolol tartrate (LOPRESSOR) 25 MG tablet Take 25 mg by mouth 2 (two) times daily.   Multiple Vitamin (MULTIVITAMIN WITH MINERALS) TABS tablet Take 1 tablet by mouth daily.   nitroGLYCERIN (NITROSTAT) 0.4 MG SL tablet PLACE 1 TABLET UNDER THE TONGUE  AT ONSET OF CHEST PAIN EVERY 5 MINTUES UP TO 3 TIMES AS NEEDED   No current facility-administered medications for this visit. (Other)      REVIEW OF SYSTEMS: ROS   Negative for: Constitutional, Gastrointestinal, Neurological, Skin, Genitourinary, Musculoskeletal, HENT, Endocrine, Cardiovascular, Eyes, Respiratory, Psychiatric, Allergic/Imm, Heme/Lymph Last edited by Morene Rankins, CMA on 02/23/2022 10:24 AM.       ALLERGIES Allergies  Allergen Reactions   Simvastatin Other (See Comments)    MUSCLE ACHES REAL BAD   Atorvastatin     Other reaction(s): stiffness, decreased energy   Lisinopril Cough   Rosuvastatin     Other reaction(s): weakness    PAST MEDICAL HISTORY Past Medical History:  Diagnosis Date   Asthma    a. Childhood.   Coronary artery disease    a. NSTEMI s/p DES to dRCA 2007. b. Angina 07/2009: s/p DESx2 to mid RCA for ISR and prox RCA, EF 65%. c. Stress test 05/2013: low risk, no evidence of ischemia, EF 61%.   Dyslipidemia    GERD (gastroesophageal reflux disease)    History of kidney stones    Hypertension    Peripheral vascular disease (HCC)    Prostate cancer ([place])    a. s/p radical prostatectomy.   PVC's (premature ventricular contractions)    S/P mitral valve clip implantation 08/19/2020   s/p  MitraClip implantation with a XTW by Dr. Burt Knack    Transient global amnesia    a. Adm 2009: imaging nonacute, had resolved.   Past Surgical History:  Procedure Laterality Date   CARDIAC CATHETERIZATION     COLONOSCOPY     CORONARY STENT INTERVENTION N/A 08/06/2020   Procedure: CORONARY STENT INTERVENTION;  Surgeon: Sherren Mocha, MD;  Location: [facility];  Service: Cardiovascular;  Laterality: N/A;   EYE SURGERY Bilateral    cataracts removed   MASTOIDECTOMY     L ear   MITRAL VALVE REPAIR     MITRAL VALVE REPAIR N/A 08/19/2020   Procedure: MITRAL VALVE REPAIR;  Surgeon: Sherren Mocha, MD;  Location: [facility];  Service:  Cardiovascular;  Laterality: N/A;   PROSTATECTOMY     RIGHT/LEFT HEART CATH AND CORONARY ANGIOGRAPHY N/A 08/06/2020   Procedure: RIGHT/LEFT HEART CATH AND CORONARY ANGIOGRAPHY;  Surgeon: Sherren Mocha, MD;  Location: [facility];  Service: Cardiovascular;  Laterality: N/A;   TEE WITHOUT CARDIOVERSION N/A 08/06/2020   Procedure: TRANSESOPHAGEAL ECHOCARDIOGRAM (TEE);  Surgeon: Sanda Klein, MD;  Location: [facility];  Service: Cardiovascular;  Laterality: N/A;   TEE WITHOUT CARDIOVERSION  08/19/2020   TEE WITHOUT CARDIOVERSION N/A 08/19/2020   Procedure: TRANSESOPHAGEAL ECHOCARDIOGRAM (TEE);  Surgeon: Sherren Mocha, MD;  Location: [facility];  Service: Cardiovascular;  Laterality: N/A;    FAMILY HISTORY Family History  Problem Relation Age of Onset   CAD Neg Hx     SOCIAL HISTORY Social History   Tobacco Use   Smoking status: Former    Types: Cigarettes    Quit date: 1980    Years since quitting: 43.7   Smokeless tobacco: Never   Tobacco comments:    Smoked for 15 years  Vaping Use   Vaping Use: Never used  Substance Use Topics   Alcohol use: Not Currently    Comment: rare   Drug use: No         OPHTHALMIC EXAM:  Base Eye Exam     Visual Acuity (ETDRS)       Right Left   Dist cc 20/30 +2 CF at 3'    Correction: Glasses         Tonometry (Tonopen, 10:28 AM)       Right Left   Pressure 8 9         Pupils       Pupils   Right PERRL   Left PERRL         Visual Fields       Left Right    Full Full         Extraocular Movement       Right Left    Ortho Ortho    -- -- --  --  --  -- -- --   -- -- --  --  --  -- -- --           Neuro/Psych     Oriented x3: Yes   Mood/Affect: Normal         Dilation     Left eye: 2.5% Phenylephrine, 1.0% Mydriacyl @ 10:25 AM           Slit Lamp and Fundus Exam     External Exam       Right Left   External Normal Normal         Slit Lamp Exam       Right  Left  Lids/Lashes Normal Normal   Conjunctiva/Sclera White and quiet White and quiet   Cornea Clear Clear   Anterior Chamber Deep and quiet Deep and quiet   Iris Round and reactive Round and reactive   Lens Centered posterior chamber intraocular lens Centered posterior chamber intraocular lens   Anterior Vitreous Normal Normal         Fundus Exam       Right Left   Posterior Vitreous  Posterior vitreous detachment   Disc  Normal   C/D Ratio  0.65   Macula  Intermediate age related macular degeneration, Geographic atrophy, Macular thickening, Mottling, Subretinal hemorrhage nasal to FAZ   Vessels  Normal   Periphery  Normal            IMAGING AND PROCEDURES  Imaging and Procedures for 02/23/22  OCT, Retina - OU - Both Eyes       Right Eye Quality was good. Scan locations included subfoveal. Central Foveal Thickness: 297. Progression has been stable. Findings include abnormal foveal contour, retinal drusen .   Left Eye Quality was good. Scan locations included subfoveal. Central Foveal Thickness: 283. Progression has improved. Findings include abnormal foveal contour, choroidal neovascular membrane, cystoid macular edema, disciform scar.   Notes OS subfoveal CNVM improving macular anatomy much less thickening.  Subfoveal disciform will limit acuity  OD we will continue to monitor inferiorly      Intravitreal Injection, Pharmacologic Agent - OS - Left Eye       Time Out 02/23/2022. 11:23 AM. Confirmed correct patient, procedure, site, and patient consented.   Anesthesia Topical anesthesia was used. Anesthetic medications included Lidocaine 4%.   Procedure Preparation included 5% betadine to ocular surface, 10% betadine to eyelids. A 30 gauge needle was used.   Injection: 2.5 mg Bevacizumab 1.'25mg'$ /0.29m   Route: Intravitreal, Site: Left Eye   NDC: [id number] Lot: [id number]  Post-op Post injection exam found visual acuity of at least counting fingers.  The patient tolerated the procedure well. There were no complications. The patient received written and verbal post procedure care education. Post injection medications included ocuflox.              ASSESSMENT/PLAN:  Exudative age-related macular degeneration of left eye with active choroidal neovascularization (HCC) OS doing well.  Vastly improved overall.  At 8-week interval today.  Repeat injection today and extend interval examination next to 10 minutes.  Acuity limited by subfoveal scarring  Advanced nonexudative age-related macular degeneration of right eye without subfoveal involvement No sign of CNVM today     ICD-10-CM   1. Exudative age-related macular degeneration of left eye with active choroidal neovascularization (HCC)  H35.3221 OCT, Retina - OU - Both Eyes    Intravitreal Injection, Pharmacologic Agent - OS - Left Eye    Bevacizumab (AVASTIN) SOLN 2.5 mg    2. Advanced nonexudative age-related macular degeneration of right eye without subfoveal involvement  H35.3113       1.  OS vision limited by subfoveal scarring from previous wet AMD.  Much less active now however on therapy.  Currently at 8-week follow-up interval post Avastin.  Repeat injection today reevaluate in 10 weeks.  2.  Patient structured to call 1 to 2 weeks prior to next visit for viability and examination  3.  Ophthalmic Meds Ordered this visit:  Meds ordered this encounter  Medications   Bevacizumab (AVASTIN) SOLN 2.5 mg       Return in about 10 weeks (around 05/04/2022) for DILATE  OU, AVASTIN OCT, OS.  Patient Instructions  Patient and family instructed to contact the office 1 to 2 weeks prior to next visit to confirm the patient and his current insurance plan  We will allow for transfers of care if required.   Explained the diagnoses, plan, and follow up with the patient and they expressed understanding.  Patient expressed understanding of the importance of proper follow up care.    Clent Demark Calen Posch M.D. Diseases & Surgery of the Retina and Vitreous [facility] [place] 02/23/22     Abbreviations: M myopia (nearsighted); A astigmatism; H hyperopia (farsighted); P presbyopia; Mrx spectacle prescription;  CTL contact lenses; OD right eye; OS left eye; OU both eyes  XT exotropia; ET esotropia; PEK punctate epithelial keratitis; PEE punctate epithelial erosions; DES dry eye syndrome; MGD meibomian gland dysfunction; ATs artificial tears; PFAT's preservative free artificial tears; [place] nuclear sclerotic cataract; PSC posterior subcapsular cataract; ERM epi-retinal membrane; PVD posterior vitreous detachment; RD retinal detachment; DM diabetes mellitus; DR diabetic retinopathy; NPDR non-proliferative diabetic retinopathy; PDR proliferative diabetic retinopathy; CSME clinically significant macular edema; DME diabetic macular edema; dbh dot blot hemorrhages; CWS cotton wool spot; POAG primary open angle glaucoma; C/D cup-to-disc ratio; HVF humphrey visual field; GVF goldmann visual field; OCT optical coherence tomography; IOP intraocular pressure; BRVO Branch retinal vein occlusion; CRVO central retinal vein occlusion; CRAO central retinal artery occlusion; BRAO branch retinal artery occlusion; RT retinal tear; SB scleral buckle; PPV pars plana vitrectomy; VH Vitreous hemorrhage; PRP panretinal laser photocoagulation; IVK intravitreal kenalog; VMT vitreomacular traction; MH Macular hole;  NVD neovascularization of the disc; NVE neovascularization elsewhere; AREDS age related eye disease study; ARMD age related macular degeneration; POAG primary open angle glaucoma; EBMD epithelial/anterior basement membrane dystrophy; ACIOL anterior chamber intraocular lens; IOL intraocular lens; PCIOL posterior chamber intraocular lens; Phaco/IOL phacoemulsification with intraocular lens placement; [place] photorefractive keratectomy; LASIK laser assisted in situ keratomileusis; HTN hypertension; DM  diabetes mellitus; COPD chronic obstructive pulmonary disease

## 2022-02-23 NOTE — Assessment & Plan Note (Signed)
No sign of CNVM today

## 2022-03-16 ENCOUNTER — Encounter (INDEPENDENT_AMBULATORY_CARE_PROVIDER_SITE_OTHER): Payer: Medicare Other | Admitting: Ophthalmology

## 2022-03-29 DIAGNOSIS — M25512 Pain in left shoulder: Secondary | ICD-10-CM | POA: Diagnosis not present

## 2022-03-29 DIAGNOSIS — M25511 Pain in right shoulder: Secondary | ICD-10-CM | POA: Diagnosis not present

## 2022-04-02 ENCOUNTER — Emergency Department (HOSPITAL_COMMUNITY): Payer: Medicare Other

## 2022-04-02 ENCOUNTER — Inpatient Hospital Stay (HOSPITAL_COMMUNITY)
Admission: EM | Admit: 2022-04-02 | Discharge: 2022-04-07 | DRG: 066 | Disposition: A | Discharge status: Rehabilitation Facility | Payer: Medicare Other | Attending: Internal Medicine | Admitting: Internal Medicine

## 2022-04-02 ENCOUNTER — Other Ambulatory Visit: Payer: Self-pay

## 2022-04-02 ENCOUNTER — Encounter (HOSPITAL_COMMUNITY): Payer: Self-pay

## 2022-04-02 DIAGNOSIS — I10 Essential (primary) hypertension: Secondary | ICD-10-CM | POA: Diagnosis not present

## 2022-04-02 DIAGNOSIS — Z8546 Personal history of malignant neoplasm of prostate: Secondary | ICD-10-CM | POA: Diagnosis not present

## 2022-04-02 DIAGNOSIS — I6312 Cerebral infarction due to embolism of basilar artery: Principal | ICD-10-CM

## 2022-04-02 DIAGNOSIS — G8321 Monoplegia of upper limb affecting right dominant side: Secondary | ICD-10-CM | POA: Diagnosis not present

## 2022-04-02 DIAGNOSIS — R55 Syncope and collapse: Secondary | ICD-10-CM | POA: Diagnosis not present

## 2022-04-02 DIAGNOSIS — I2510 Atherosclerotic heart disease of native coronary artery without angina pectoris: Secondary | ICD-10-CM | POA: Diagnosis not present

## 2022-04-02 DIAGNOSIS — M4312 Spondylolisthesis, cervical region: Secondary | ICD-10-CM | POA: Diagnosis not present

## 2022-04-02 DIAGNOSIS — I739 Peripheral vascular disease, unspecified: Secondary | ICD-10-CM | POA: Diagnosis not present

## 2022-04-02 DIAGNOSIS — K219 Gastro-esophageal reflux disease without esophagitis: Secondary | ICD-10-CM | POA: Diagnosis present

## 2022-04-02 DIAGNOSIS — I6523 Occlusion and stenosis of bilateral carotid arteries: Secondary | ICD-10-CM | POA: Diagnosis not present

## 2022-04-02 DIAGNOSIS — N1832 Chronic kidney disease, stage 3b: Secondary | ICD-10-CM | POA: Diagnosis not present

## 2022-04-02 DIAGNOSIS — I129 Hypertensive chronic kidney disease with stage 1 through stage 4 chronic kidney disease, or unspecified chronic kidney disease: Secondary | ICD-10-CM | POA: Diagnosis not present

## 2022-04-02 DIAGNOSIS — Z9079 Acquired absence of other genital organ(s): Secondary | ICD-10-CM

## 2022-04-02 DIAGNOSIS — R4182 Altered mental status, unspecified: Secondary | ICD-10-CM | POA: Diagnosis not present

## 2022-04-02 DIAGNOSIS — Z79899 Other long term (current) drug therapy: Secondary | ICD-10-CM | POA: Diagnosis not present

## 2022-04-02 DIAGNOSIS — Z8249 Family history of ischemic heart disease and other diseases of the circulatory system: Secondary | ICD-10-CM

## 2022-04-02 DIAGNOSIS — R471 Dysarthria and anarthria: Secondary | ICD-10-CM | POA: Diagnosis not present

## 2022-04-02 DIAGNOSIS — Z8673 Personal history of transient ischemic attack (TIA), and cerebral infarction without residual deficits: Secondary | ICD-10-CM

## 2022-04-02 DIAGNOSIS — Z7982 Long term (current) use of aspirin: Secondary | ICD-10-CM | POA: Diagnosis not present

## 2022-04-02 DIAGNOSIS — J45909 Unspecified asthma, uncomplicated: Secondary | ICD-10-CM | POA: Diagnosis present

## 2022-04-02 DIAGNOSIS — Z87891 Personal history of nicotine dependence: Secondary | ICD-10-CM | POA: Diagnosis not present

## 2022-04-02 DIAGNOSIS — R29710 NIHSS score 10: Secondary | ICD-10-CM | POA: Diagnosis present

## 2022-04-02 DIAGNOSIS — I6389 Other cerebral infarction: Secondary | ICD-10-CM | POA: Diagnosis not present

## 2022-04-02 DIAGNOSIS — Z743 Need for continuous supervision: Secondary | ICD-10-CM | POA: Diagnosis not present

## 2022-04-02 DIAGNOSIS — W19XXXA Unspecified fall, initial encounter: Secondary | ICD-10-CM

## 2022-04-02 DIAGNOSIS — S0993XA Unspecified injury of face, initial encounter: Secondary | ICD-10-CM | POA: Diagnosis not present

## 2022-04-02 DIAGNOSIS — I63432 Cerebral infarction due to embolism of left posterior cerebral artery: Principal | ICD-10-CM | POA: Diagnosis present

## 2022-04-02 DIAGNOSIS — I639 Cerebral infarction, unspecified: Secondary | ICD-10-CM

## 2022-04-02 DIAGNOSIS — Z955 Presence of coronary angioplasty implant and graft: Secondary | ICD-10-CM | POA: Diagnosis not present

## 2022-04-02 DIAGNOSIS — Y92009 Unspecified place in unspecified non-institutional (private) residence as the place of occurrence of the external cause: Secondary | ICD-10-CM

## 2022-04-02 DIAGNOSIS — I252 Old myocardial infarction: Secondary | ICD-10-CM

## 2022-04-02 DIAGNOSIS — R531 Weakness: Secondary | ICD-10-CM | POA: Diagnosis not present

## 2022-04-02 DIAGNOSIS — R4701 Aphasia: Secondary | ICD-10-CM | POA: Diagnosis present

## 2022-04-02 DIAGNOSIS — E785 Hyperlipidemia, unspecified: Secondary | ICD-10-CM | POA: Diagnosis present

## 2022-04-02 DIAGNOSIS — E876 Hypokalemia: Secondary | ICD-10-CM | POA: Diagnosis not present

## 2022-04-02 DIAGNOSIS — Z888 Allergy status to other drugs, medicaments and biological substances status: Secondary | ICD-10-CM | POA: Diagnosis not present

## 2022-04-02 DIAGNOSIS — H5347 Heteronymous bilateral field defects: Secondary | ICD-10-CM | POA: Diagnosis not present

## 2022-04-02 DIAGNOSIS — J439 Emphysema, unspecified: Secondary | ICD-10-CM | POA: Diagnosis not present

## 2022-04-02 HISTORY — DX: Unspecified fall, initial encounter: W19.XXXA

## 2022-04-02 LAB — URINALYSIS, ROUTINE W REFLEX MICROSCOPIC
Bacteria, UA: NONE SEEN
Bilirubin Urine: NEGATIVE
Glucose, UA: NEGATIVE mg/dL
Ketones, ur: 20 mg/dL — AB
Leukocytes,Ua: NEGATIVE
Nitrite: NEGATIVE
Protein, ur: 30 mg/dL — AB
Specific Gravity, Urine: 1.011 (ref 1.005–1.030)
pH: 7 (ref 5.0–8.0)

## 2022-04-02 LAB — CBC WITH DIFFERENTIAL/PLATELET
Abs Immature Granulocytes: 0.05 10*3/uL (ref 0.00–0.07)
Basophils Absolute: 0 10*3/uL (ref 0.0–0.1)
Basophils Relative: 0 %
Eosinophils Absolute: 0 10*3/uL (ref 0.0–0.5)
Eosinophils Relative: 0 %
HCT: 38.7 % — ABNORMAL LOW (ref 39.0–52.0)
Hemoglobin: 12.7 g/dL — ABNORMAL LOW (ref 13.0–17.0)
Immature Granulocytes: 0 %
Lymphocytes Relative: 9 %
Lymphs Abs: 1 10*3/uL (ref 0.7–4.0)
MCH: 31.4 pg (ref 26.0–34.0)
MCHC: 32.8 g/dL (ref 30.0–36.0)
MCV: 95.8 fL (ref 80.0–100.0)
Monocytes Absolute: 0.9 10*3/uL (ref 0.1–1.0)
Monocytes Relative: 8 %
Neutro Abs: 9.6 10*3/uL — ABNORMAL HIGH (ref 1.7–7.7)
Neutrophils Relative %: 83 %
Platelets: 268 10*3/uL (ref 150–400)
RBC: 4.04 MIL/uL — ABNORMAL LOW (ref 4.22–5.81)
RDW: 12.6 % (ref 11.5–15.5)
WBC: 11.5 10*3/uL — ABNORMAL HIGH (ref 4.0–10.5)
nRBC: 0 % (ref 0.0–0.2)

## 2022-04-02 LAB — COMPREHENSIVE METABOLIC PANEL
ALT: 19 U/L (ref 0–44)
AST: 29 U/L (ref 15–41)
Albumin: 3.8 g/dL (ref 3.5–5.0)
Alkaline Phosphatase: 66 U/L (ref 38–126)
Anion gap: 9 (ref 5–15)
BUN: 37 mg/dL — ABNORMAL HIGH (ref 8–23)
CO2: 23 mmol/L (ref 22–32)
Calcium: 8.7 mg/dL — ABNORMAL LOW (ref 8.9–10.3)
Chloride: 110 mmol/L (ref 98–111)
Creatinine, Ser: 1.61 mg/dL — ABNORMAL HIGH (ref 0.61–1.24)
GFR, Estimated: 41 mL/min — ABNORMAL LOW (ref >60)
Glucose, Bld: 105 mg/dL — ABNORMAL HIGH (ref 70–99)
Potassium: 3.2 mmol/L — ABNORMAL LOW (ref 3.5–5.1)
Sodium: 142 mmol/L (ref 135–145)
Total Bilirubin: 1.1 mg/dL (ref 0.3–1.2)
Total Protein: 6.5 g/dL (ref 6.5–8.1)

## 2022-04-02 LAB — CK: Total CK: 413 U/L — ABNORMAL HIGH (ref 49–397)

## 2022-04-02 MED ORDER — SODIUM CHLORIDE 0.9 % IV BOLUS
500.0000 mL | Freq: Once | INTRAVENOUS | Status: AC
  Administered 2022-04-02: 500 mL via INTRAVENOUS

## 2022-04-02 MED ORDER — CLOPIDOGREL BISULFATE 75 MG PO TABS
300.0000 mg | ORAL_TABLET | Freq: Once | ORAL | Status: AC
  Administered 2022-04-02: 300 mg via ORAL
  Filled 2022-04-02: qty 4

## 2022-04-02 NOTE — Assessment & Plan Note (Addendum)
-  baseline creatinine 1.4-1.6 - Appears to be stable -Maintain adequate hydration -Continue to follow renal function trend.

## 2022-04-02 NOTE — Assessment & Plan Note (Addendum)
-   Right upper extremity weakness, reported dysarthria - Last known well 4 PM yesterday - Neuro recommends work-up.  [name] - Loaded with Plavix 300 mg - Continue Plavix and aspirin - Inpatient consult to neurology - ST/PT/OT eval and treat - Echo in the a.m., ultrasound carotids - MRI in the a.m. so long as mitral valve clip is compatible - Neurochecks - Continue to monitor

## 2022-04-02 NOTE — Assessment & Plan Note (Signed)
-   Holding losartan and metoprolol in the setting of permissive hypertension for 24 hours

## 2022-04-02 NOTE — ED Provider Notes (Signed)
[facility] EMERGENCY DEPARTMENT Provider Note   CSN: [id number] Arrival date & time: 04/02/22  1812     History {Add pertinent medical, surgical, social history, OB history to HPI:1} Chief Complaint  Patient presents with   Altered Mental Status    Adam Arellano is a 88 y.o. male.  Patient has a history of coronary artery disease.  He was last seen normal about 4 PM yesterday.  His daughter called him today and he did not sound right and someone went over and checked that him at 6 PM.  He was in his bed.  He had abrasions to his arms and contusion to his head   Altered Mental Status      Home Medications Prior to Admission medications   Medication Sig Start Date End Date Taking? Authorizing Provider  aspirin EC 81 MG tablet Take 81 mg by mouth daily. Swallow whole.    [provider]  ciprofloxacin-dexamethasone (CIPRODEX) OTIC suspension Place 4 drops into the left ear 2 (two) times daily as needed (ear infection).    [provider]  FIBER PO Take 1 capsule by mouth daily.    [provider]  furosemide (LASIX) 40 MG tablet TAKE 1 TABLET DAILY 10/10/21   Jerline Pain, MD  gabapentin (NEURONTIN) 100 MG capsule Take 100 mg by mouth at bedtime. 12/31/21   [provider]  losartan (COZAAR) 25 MG tablet TAKE 1 TABLET DAILY 01/16/22   Jerline Pain, MD  metoprolol tartrate (LOPRESSOR) 25 MG tablet Take 25 mg by mouth 2 (two) times daily. 10/24/13   Jerline Pain, MD  Multiple Vitamin (MULTIVITAMIN WITH MINERALS) TABS tablet Take 1 tablet by mouth daily.    [provider]  nitroGLYCERIN (NITROSTAT) 0.4 MG SL tablet PLACE 1 TABLET UNDER THE TONGUE AT ONSET OF CHEST PAIN EVERY 5 MINTUES UP TO 3 TIMES AS NEEDED 08/03/21   Eileen Stanford, PA-C  Polyvinyl Alcohol-Povidone (REFRESH OP) Place 1 drop into both eyes daily.    [provider]      Allergies    Simvastatin, Atorvastatin, Lisinopril, and Rosuvastatin    Review  of Systems   Review of Systems  Physical Exam Updated Vital Signs BP (!) 173/84   Pulse 72   Temp 98.8 F (37.1 C)   Resp 16   Ht '5\' 7"'$  (1.702 m)   Wt 73 kg   SpO2 98%   BMI 25.21 kg/m  Physical Exam  ED Results / Procedures / Treatments   Labs (all labs ordered are listed, but only abnormal results are displayed) Labs Reviewed  URINALYSIS, ROUTINE W REFLEX MICROSCOPIC - Abnormal; Notable for the following components:      Result Value   Hgb urine dipstick SMALL (*)    Ketones, ur 20 (*)    Protein, ur 30 (*)    All other components within normal limits  CBC WITH DIFFERENTIAL/PLATELET - Abnormal; Notable for the following components:   WBC 11.5 (*)    RBC 4.04 (*)    Hemoglobin 12.7 (*)    HCT 38.7 (*)    Neutro Abs 9.6 (*)    All other components within normal limits  COMPREHENSIVE METABOLIC PANEL - Abnormal; Notable for the following components:   Potassium 3.2 (*)    Glucose, Bld 105 (*)    BUN 37 (*)    Creatinine, Ser 1.61 (*)    Calcium 8.7 (*)    GFR, Estimated 41 (*)    All  other components within normal limits  URINE CULTURE  CK    EKG None  Radiology CT Head Wo Contrast  Result Date: 04/02/2022 CLINICAL DATA:  Altered mental status with trauma to the head, face, and cervical spine. The patient is reportedly found normal approximately 24 hours ago. EXAM: CT HEAD WITHOUT CONTRAST CT MAXILLOFACIAL WITHOUT CONTRAST CT CERVICAL SPINE WITHOUT CONTRAST TECHNIQUE: Multidetector CT imaging of the head, cervical spine, and maxillofacial structures were performed using the standard protocol without intravenous contrast. Multiplanar CT image reconstructions of the cervical spine and maxillofacial structures were also generated. RADIATION DOSE REDUCTION: This exam was performed according to the departmental dose-optimization program which includes automated exposure control, adjustment of the mA and/or kV according to patient size and/or use of iterative  reconstruction technique. COMPARISON:  CT head dated 08/26/2007. FINDINGS: CT HEAD FINDINGS Brain: There is loss of the gray-white matter differentiation in a moderate volume region of the left temporal lobe and occipital lobe in the left posterior cerebral artery territory. There is no significant swelling of the parenchyma or associated mass effect, suggestive of a hyperacute to acute infarct. No acute intra or extra-axial fluid collection is identified. There is mild cerebral volume loss with associated ex vacuo dilatation. There is no midline shift and the basilar cisterns are patent. Periventricular white matter hypoattenuation likely represents chronic small vessel ischemic disease. Vascular: There are vascular calcifications in the carotid siphons. Skull: Normal. Negative for fracture or focal lesion. Other: Soft tissue density in the left middle ear likely reflects the patient's known cholesteatoma. CT MAXILLOFACIAL FINDINGS Osseous: No fracture or mandibular dislocation. No destructive process. Orbits: Negative. No traumatic or inflammatory finding. Sinuses: There is right maxillary sinus disease. Soft tissues: Negative. CT CERVICAL SPINE FINDINGS Alignment: There is chronic appearing anterolisthesis of C2 on C3 C3 on C4, each measuring 3 mm. There is 2 mm retrolisthesis of C4 on C5. No acute traumatic listhesis is identified. Skull base and vertebrae: No acute fracture. No primary bone lesion or focal pathologic process. Soft tissues and spinal canal: No prevertebral fluid or swelling. No visible canal hematoma. Disc levels: Up to severe multilevel degenerative disc and joint disease. Upper chest: Negative. Other: None. IMPRESSION: 1. Moderate volume hyperacute to acute infarct in the left posterior cerebral artery territory without significant associated mass effect. 2. No acute facial bone fracture. 3. No acute osseous injury in the cervical spine. These results were called by telephone at the time of  interpretation on 04/02/2022 at 8:27 pm to provider [facility] Aris Even , who verbally acknowledged these results. Electronically Signed   By: Zerita Boers M.D.   On: 04/02/2022 20:31   CT Cervical Spine Wo Contrast  Result Date: 04/02/2022 CLINICAL DATA:  Altered mental status with trauma to the head, face, and cervical spine. The patient is reportedly found normal approximately 24 hours ago. EXAM: CT HEAD WITHOUT CONTRAST CT MAXILLOFACIAL WITHOUT CONTRAST CT CERVICAL SPINE WITHOUT CONTRAST TECHNIQUE: Multidetector CT imaging of the head, cervical spine, and maxillofacial structures were performed using the standard protocol without intravenous contrast. Multiplanar CT image reconstructions of the cervical spine and maxillofacial structures were also generated. RADIATION DOSE REDUCTION: This exam was performed according to the departmental dose-optimization program which includes automated exposure control, adjustment of the mA and/or kV according to patient size and/or use of iterative reconstruction technique. COMPARISON:  CT head dated 08/26/2007. FINDINGS: CT HEAD FINDINGS Brain: There is loss of the gray-white matter differentiation in a moderate volume region of the left temporal lobe  and occipital lobe in the left posterior cerebral artery territory. There is no significant swelling of the parenchyma or associated mass effect, suggestive of a hyperacute to acute infarct. No acute intra or extra-axial fluid collection is identified. There is mild cerebral volume loss with associated ex vacuo dilatation. There is no midline shift and the basilar cisterns are patent. Periventricular white matter hypoattenuation likely represents chronic small vessel ischemic disease. Vascular: There are vascular calcifications in the carotid siphons. Skull: Normal. Negative for fracture or focal lesion. Other: Soft tissue density in the left middle ear likely reflects the patient's known cholesteatoma. CT MAXILLOFACIAL FINDINGS  Osseous: No fracture or mandibular dislocation. No destructive process. Orbits: Negative. No traumatic or inflammatory finding. Sinuses: There is right maxillary sinus disease. Soft tissues: Negative. CT CERVICAL SPINE FINDINGS Alignment: There is chronic appearing anterolisthesis of C2 on C3 C3 on C4, each measuring 3 mm. There is 2 mm retrolisthesis of C4 on C5. No acute traumatic listhesis is identified. Skull base and vertebrae: No acute fracture. No primary bone lesion or focal pathologic process. Soft tissues and spinal canal: No prevertebral fluid or swelling. No visible canal hematoma. Disc levels: Up to severe multilevel degenerative disc and joint disease. Upper chest: Negative. Other: None. IMPRESSION: 1. Moderate volume hyperacute to acute infarct in the left posterior cerebral artery territory without significant associated mass effect. 2. No acute facial bone fracture. 3. No acute osseous injury in the cervical spine. These results were called by telephone at the time of interpretation on 04/02/2022 at 8:27 pm to provider [facility] Jocelynn Gioffre , who verbally acknowledged these results. Electronically Signed   By: Zerita Boers M.D.   On: 04/02/2022 20:31   CT Maxillofacial Wo Contrast  Result Date: 04/02/2022 CLINICAL DATA:  Altered mental status with trauma to the head, face, and cervical spine. The patient is reportedly found normal approximately 24 hours ago. EXAM: CT HEAD WITHOUT CONTRAST CT MAXILLOFACIAL WITHOUT CONTRAST CT CERVICAL SPINE WITHOUT CONTRAST TECHNIQUE: Multidetector CT imaging of the head, cervical spine, and maxillofacial structures were performed using the standard protocol without intravenous contrast. Multiplanar CT image reconstructions of the cervical spine and maxillofacial structures were also generated. RADIATION DOSE REDUCTION: This exam was performed according to the departmental dose-optimization program which includes automated exposure control, adjustment of the mA and/or  kV according to patient size and/or use of iterative reconstruction technique. COMPARISON:  CT head dated 08/26/2007. FINDINGS: CT HEAD FINDINGS Brain: There is loss of the gray-white matter differentiation in a moderate volume region of the left temporal lobe and occipital lobe in the left posterior cerebral artery territory. There is no significant swelling of the parenchyma or associated mass effect, suggestive of a hyperacute to acute infarct. No acute intra or extra-axial fluid collection is identified. There is mild cerebral volume loss with associated ex vacuo dilatation. There is no midline shift and the basilar cisterns are patent. Periventricular white matter hypoattenuation likely represents chronic small vessel ischemic disease. Vascular: There are vascular calcifications in the carotid siphons. Skull: Normal. Negative for fracture or focal lesion. Other: Soft tissue density in the left middle ear likely reflects the patient's known cholesteatoma. CT MAXILLOFACIAL FINDINGS Osseous: No fracture or mandibular dislocation. No destructive process. Orbits: Negative. No traumatic or inflammatory finding. Sinuses: There is right maxillary sinus disease. Soft tissues: Negative. CT CERVICAL SPINE FINDINGS Alignment: There is chronic appearing anterolisthesis of C2 on C3 C3 on C4, each measuring 3 mm. There is 2 mm retrolisthesis of C4 on C5. No  acute traumatic listhesis is identified. Skull base and vertebrae: No acute fracture. No primary bone lesion or focal pathologic process. Soft tissues and spinal canal: No prevertebral fluid or swelling. No visible canal hematoma. Disc levels: Up to severe multilevel degenerative disc and joint disease. Upper chest: Negative. Other: None. IMPRESSION: 1. Moderate volume hyperacute to acute infarct in the left posterior cerebral artery territory without significant associated mass effect. 2. No acute facial bone fracture. 3. No acute osseous injury in the cervical spine.  These results were called by telephone at the time of interpretation on 04/02/2022 at 8:27 pm to provider [facility] Kaesha Kirsch , who verbally acknowledged these results. Electronically Signed   By: Zerita Boers M.D.   On: 04/02/2022 20:31   DG Chest Port 1 View  Result Date: 04/02/2022 CLINICAL DATA:  Weakness. EXAM: PORTABLE CHEST 1 VIEW COMPARISON:  11/02/2021 FINDINGS: 1913 hours. Lungs are hyperexpanded. The cardio pericardial silhouette is enlarged. Interstitial markings are diffusely coarsened with chronic features. The lungs are clear without focal pneumonia, edema, pneumothorax or pleural effusion. Telemetry leads overlie the chest. IMPRESSION: Emphysema and cardiomegaly without acute cardiopulmonary findings. Electronically Signed   By: Misty Stanley M.D.   On: 04/02/2022 19:48   DG Pelvis Portable  Result Date: 04/02/2022 CLINICAL DATA:  Weakness. EXAM: PORTABLE PELVIS 1-2 VIEWS COMPARISON:  None Available. FINDINGS: No acute fracture or dislocation. The bones are osteopenic. Degenerative changes of the lower lumbar spine. Multiple surgical clips over the pelvis. The soft tissues are grossly unremarkable. IMPRESSION: No acute fracture or dislocation. Osteopenia. Electronically Signed   By: Anner Crete M.D.   On: 04/02/2022 19:47    Procedures Procedures  {Document cardiac monitor, telemetry assessment procedure when appropriate:1}  Medications Ordered in ED Medications  clopidogrel (PLAVIX) tablet 300 mg (has no administration in time range)  sodium chloride 0.9 % bolus 500 mL (0 mLs Intravenous Stopped 04/02/22 2018)    ED Course/ Medical Decision Making/ A&P  CRITICAL CARE Performed by: Milton [name] Total critical care time: 40 minutes Critical care time was exclusive of separately billable procedures and treating other patients. Critical care was necessary to treat or prevent imminent or life-threatening deterioration. Critical care was time spent personally by me on the  following activities: development of treatment plan with patient and/or surrogate as well as nursing, discussions with consultants, evaluation of patient's response to treatment, examination of patient, obtaining history from patient or surrogate, ordering and performing treatments and interventions, ordering and review of laboratory studies, ordering and review of radiographic studies, pulse oximetry and re-evaluation of patient's condition.   Patient with acute stroke but last known well was over 24 hours.  I spoke with Dr.Khaliqdina neurology and he stated the patient could stay at [facility] and get worked up for stroke.  He does feel patient needs to be on aspirin and Plavix                         Medical Decision Making Amount and/or Complexity of Data Reviewed Labs: ordered. Radiology: ordered.  Risk Prescription drug management. Decision regarding hospitalization.   Patient with acute stroke will be admitted to medicine  {Document critical care time when appropriate:1} {Document review of labs and clinical decision tools ie heart score, Chads2Vasc2 etc:1}  {Document your independent review of radiology images, and any outside records:1} {Document your discussion with family members, caretakers, and with consultants:1} {Document social determinants of health affecting pt's care:1} {Document your decision making why  or why not admission, treatments were needed:1} Final Clinical Impression(s) / ED Diagnoses Final diagnoses:  Acute stroke due to embolism of basilar artery ([place])    Rx / DC Orders ED Discharge Orders     None

## 2022-04-02 NOTE — H&P (Signed)
History and Physical    Patient: Adam Arellano:[id number] DOB: 02-12-1932 DOA: 04/02/2022 DOS: the patient was seen and examined on 04/02/2022 PCP: Lajean Manes, MD  Patient coming from: Home  Chief Complaint:  Chief Complaint  Patient presents with   Altered Mental Status   HPI: Adam Arellano is a 88 y.o. male with medical history significant of history of coronary artery disease, hyperlipidemia, GERD, hypertension, status post mitral valve clip implant, and more presents to the ED with a chief complaint of altered mental status.  Is reported the daughter spoke to patient last yesterday around 4 PM and he was normal at that time.  Patient reports that he was normal at the time that he went to bed.  Reports he was also normal when he got up this morning.  He reports that he remembers eating breakfast and felt that he was in his normal state of health at that time as well.,  And then daughter was trying to get a hold of him later in the day and could not reach him.  She sent somebody to check on him and he was found in his bed saturated in urine, with bruises and blood around, everything knocked off the nightstand, and with slurred speech.  Patient reports he does not remember that.  He does not remember falling.  Reports he is not in any pain right now.  He does not remember having a headache, dizziness, chest pain, palpitations, dyspnea.  Daughter reports at baseline he has clear speech, lives independently, takes care of his own ADLs.  He has no history of tremor, although there is a right upper extremity tremor at this time.  He has no history of contracture although he is holding his right upper extremity in a fist, unless he really focuses on opening his hand.  Patient is no longer slurred, but it is reported that it was slurred with he was found.  He also had incoherent speech where the words were not making sense when he was found.  Patient is not able to contribute any more to this  history.  Of note, he does have a mitral valve clip and compatibility with MRI is unknown.  Patient does not smoke, does not drink.  He is vaccinated for COVID.  Patient is full code. Review of Systems: As mentioned in the history of present illness. All other systems reviewed and are negative. Past Medical History:  Diagnosis Date   Asthma    a. Childhood.   Coronary artery disease    a. NSTEMI s/p DES to dRCA 2007. b. Angina 07/2009: s/p DESx2 to mid RCA for ISR and prox RCA, EF 65%. c. Stress test 05/2013: low risk, no evidence of ischemia, EF 61%.   Dyslipidemia    GERD (gastroesophageal reflux disease)    History of kidney stones    Hypertension    Peripheral vascular disease (HCC)    Prostate cancer ([place])    a. s/p radical prostatectomy.   PVC's (premature ventricular contractions)    S/P mitral valve clip implantation 08/19/2020   s/p MitraClip implantation with a XTW by Dr. Burt Knack    Transient global amnesia    a. Adm 2009: imaging nonacute, had resolved.   Past Surgical History:  Procedure Laterality Date   CARDIAC CATHETERIZATION     COLONOSCOPY     CORONARY STENT INTERVENTION N/A 08/06/2020   Procedure: CORONARY STENT INTERVENTION;  Surgeon: Sherren Mocha, MD;  Location: [place] [facility];  Service:  Cardiovascular;  Laterality: N/A;   EYE SURGERY Bilateral    cataracts removed   MASTOIDECTOMY     L ear   MITRAL VALVE REPAIR     MITRAL VALVE REPAIR N/A 08/19/2020   Procedure: MITRAL VALVE REPAIR;  Surgeon: Sherren Mocha, MD;  Location: [place] [facility];  Service: Cardiovascular;  Laterality: N/A;   PROSTATECTOMY     RIGHT/LEFT HEART CATH AND CORONARY ANGIOGRAPHY N/A 08/06/2020   Procedure: RIGHT/LEFT HEART CATH AND CORONARY ANGIOGRAPHY;  Surgeon: Sherren Mocha, MD;  Location: [place] [facility];  Service: Cardiovascular;  Laterality: N/A;   TEE WITHOUT CARDIOVERSION N/A 08/06/2020   Procedure: TRANSESOPHAGEAL ECHOCARDIOGRAM (TEE);  Surgeon: Sanda Klein, MD;   Location: [facility];  Service: Cardiovascular;  Laterality: N/A;   TEE WITHOUT CARDIOVERSION  08/19/2020   TEE WITHOUT CARDIOVERSION N/A 08/19/2020   Procedure: TRANSESOPHAGEAL ECHOCARDIOGRAM (TEE);  Surgeon: Sherren Mocha, MD;  Location: [place] [facility];  Service: Cardiovascular;  Laterality: N/A;   Social History:  reports that he quit smoking about 43 years ago. His smoking use included cigarettes. He has never used smokeless tobacco. He reports that he does not currently use alcohol. He reports that he does not use drugs.  Allergies  Allergen Reactions   Simvastatin Other (See Comments)    MUSCLE ACHES REAL BAD   Atorvastatin     Other reaction(s): stiffness, decreased energy   Lisinopril Cough   Rosuvastatin     Other reaction(s): weakness    Family History  Problem Relation Age of Onset   CAD Neg Hx     Prior to Admission medications   Medication Sig Start Date End Date Taking? Authorizing Provider  aspirin EC 81 MG tablet Take 81 mg by mouth daily. Swallow whole.    [provider]  ciprofloxacin-dexamethasone (CIPRODEX) OTIC suspension Place 4 drops into the left ear 2 (two) times daily as needed (ear infection).    [provider]  FIBER PO Take 1 capsule by mouth daily.    [provider]  furosemide (LASIX) 40 MG tablet TAKE 1 TABLET DAILY 10/10/21   Jerline Pain, MD  gabapentin (NEURONTIN) 100 MG capsule Take 100 mg by mouth at bedtime. 12/31/21   [provider]  losartan (COZAAR) 25 MG tablet TAKE 1 TABLET DAILY 01/16/22   Jerline Pain, MD  metoprolol tartrate (LOPRESSOR) 25 MG tablet Take 25 mg by mouth 2 (two) times daily. 10/24/13   Jerline Pain, MD  Multiple Vitamin (MULTIVITAMIN WITH MINERALS) TABS tablet Take 1 tablet by mouth daily.    [provider]  nitroGLYCERIN (NITROSTAT) 0.4 MG SL tablet PLACE 1 TABLET UNDER THE TONGUE AT ONSET OF CHEST PAIN EVERY 5 MINTUES UP TO 3 TIMES AS NEEDED 08/03/21   Eileen Stanford, PA-C  Polyvinyl Alcohol-Povidone (REFRESH OP) Place 1 drop into both eyes daily.    [provider]    Physical Exam: Vitals:   04/02/22 1930 04/02/22 2000 04/02/22 2030 04/02/22 2100  BP: (!) 172/71 (!) 172/87 (!) 162/82 (!) 173/84  Pulse: 72 81 73 72  Resp: '17 17 19 16  '$ Temp:      SpO2: 100% 99% 98% 98%  Weight:      Height:       1.  General: Patient lying supine in bed, head of bed elevated, no acute distress   2. Psychiatric: Alert and oriented, mood and behavior normal for situation, pleasant and cooperative with exam   3. Neurologic: Speech  at baseline and language is normal, face is symmetric, moves all 4 extremities voluntarily, right upper extremity weakness compared to the left although he does have a history of right rotator cuff tear, at baseline without acute deficits on limited exam   4. HEENMT:  Bruising over right temple, normocephalic, pupils reactive to light, neck is supple, trachea is midline, minimal loss of lateral visual field on the right mucous membranes are moist   5. Respiratory : Lungs are clear to auscultation bilaterally without wheezing, rhonchi, rales, no cyanosis, no increase in work of breathing or accessory muscle use   6. Cardiovascular : Heart rate normal, rhythm is regular, no murmurs, rubs or gallops, trace peripheral edema, peripheral pulses palpated   7. Gastrointestinal:  Abdomen is soft, nondistended, nontender to palpation bowel sounds active, no masses or organomegaly palpated   8. Skin:  Skin tears right upper extremity   9.Musculoskeletal:  No acute deformities or trauma, no asymmetry in tone, no peripheral edema, peripheral pulses palpated, no tenderness to palpation in the extremities  Data Reviewed: In the ED Temp 98.4-98.8, heart rate 73-99, respiratory rate 14-25, blood pressure 162/71-195/98, satting 98-100%  , 11.5, hemoglobin 12.7, platelets 268 Hypokalemic at 3.2, creatinine at baseline UA  unremarkable for UTI CT C-spine and head shows moderate volume hyperacute to acute infarct in left posterior cerebral artery territory without significant mass effect.  No facial fractures.  No acute osseous injury of the C-spine. Chest x-ray shows emphysema and cardiomegaly without acute cardiopulmonary disease Pelvis shows no acute fracture or dislocation Patient was given half a liter bolus Neurology was consulted and recommended starting Plavix and doing work-up [facility] at [facility]. Plavix 300 started, patient was already on aspirin Admission requested for stroke work-up  Assessment and Plan: * CVA (cerebral vascular accident) ([place]) - Right upper extremity weakness, reported dysarthria - Last known well 4 PM yesterday - Neuro recommends work-up.  [place] - Loaded with Plavix 300 mg - Continue Plavix and aspirin - Inpatient consult to neurology - ST/PT/OT eval and treat - Echo in the a.m., ultrasound carotids - MRI in the a.m. so long as mitral valve clip is compatible - Neurochecks - Continue to monitor  Fall at home, initial encounter - Likely related to CVA - CPK as we do not know how long patient was down - PT eval and treat - C-spine, chest x-ray, x-ray pelvis without acute changes - CT head shows acute infarct, but no bleed - Continue to monitor  Hypokalemia - 40 mEq potassium at admission - Trend in the a.m.  Chronic kidney disease, stage 3b (HCC) - Creatinine 1.61 at baseline - Would avoid nephrotoxic agents when possible - Continue to monitor  Essential hypertension - Holding losartan and metoprolol in the setting of permissive hypertension for 24 hours      Advance Care Planning:   Code Status: Prior full  Consults: Neurology  Family Communication: Daughter at bedside  Severity of Illness: The appropriate patient status for this patient is OBSERVATION. Observation status is judged to be reasonable and necessary in order to provide the required  intensity of service to ensure the patient's safety. The patient's presenting symptoms, physical exam findings, and initial radiographic and laboratory data in the context of their medical condition is felt to place them at decreased risk for further clinical deterioration. Furthermore, it is anticipated that the patient will be medically stable for discharge from the hospital within 2 midnights of admission.   Author: Rolla Plate,  DO 04/02/2022 11:24 PM  For on call review [email or web address].

## 2022-04-02 NOTE — Assessment & Plan Note (Signed)
-   Repleted and within normal limits currently -Continue to follow electrolytes trend.

## 2022-04-02 NOTE — H&P (Incomplete)
History and Physical    Patient: Adam Arellano KGM:[id number] DOB: 03-20-1932 DOA: 04/02/2022 DOS: the patient was seen and examined on 04/02/2022 PCP: Lajean Manes, MD  Patient coming from: Home  Chief Complaint:  Chief Complaint  Patient presents with  . Altered Mental Status   HPI: Adam Arellano is a 88 y.o. male with medical history significant of history of coronary artery disease, hyperlipidemia, GERD, hypertension, status post mitral valve clip implant, and more presents to the ED with a chief complaint of altered mental status.  Is reported the daughter spoke to patient last yesterday around 4 PM and he was normal at that time.  Patient reports that he was normal at the time that he went to bed.  Reports he was also normal when he got up this morning.  He reports that he remembers eating breakfast and felt that he was in his normal state of health at that time as well.,  And then daughter was trying to get a hold of him later in the day and could not reach him.  She sent somebody to check on him and he was found in his bed saturated in urine, with bruises and blood around, everything knocked off the nightstand, and with slurred speech.  Patient reports he does not remember that.  He does not remember falling.  Reports he is not in any pain right now.  He does not remember having a headache, dizziness, chest pain, palpitations, dyspnea.  Daughter reports at baseline he has clear speech, lives independently, takes care of his own ADLs.  He has no history of tremor, although there is a right upper extremity tremor at this time.  He has no history of contracture although he is holding his right upper extremity in a fist, unless he really focuses on opening his hand.  Patient is no longer slurred, but it is reported that it was slurred with he was found.  He also had incoherent speech where the words were not making sense when he was found.  Patient is not able to contribute any more to  this history.  Of note, he does have a mitral valve clip and compatibility with MRI is unknown.  Patient does not smoke, does not drink.  He is vaccinated for COVID.  Patient is full code. Review of Systems: As mentioned in the history of present illness. All other systems reviewed and are negative. Past Medical History:  Diagnosis Date  . Asthma    a. Childhood.  . Coronary artery disease    a. NSTEMI s/p DES to dRCA 2007. b. Angina 07/2009: s/p DESx2 to mid RCA for ISR and prox RCA, EF 65%. c. Stress test 05/2013: low risk, no evidence of ischemia, EF 61%.  . Dyslipidemia   . GERD (gastroesophageal reflux disease)   . History of kidney stones   . Hypertension   . Peripheral vascular disease ([place])   . Prostate cancer [facility])    a. s/p radical prostatectomy.  [name] PVC's (premature ventricular contractions)   . S/P mitral valve clip implantation 08/19/2020   s/p MitraClip implantation with a XTW by Dr. Burt Knack   . Transient global amnesia    a. Adm 2009: imaging nonacute, had resolved.   Past Surgical History:  Procedure Laterality Date  . CARDIAC CATHETERIZATION    . COLONOSCOPY    . CORONARY STENT INTERVENTION N/A 08/06/2020   Procedure: CORONARY STENT INTERVENTION;  Surgeon: Sherren Mocha, MD;  Location: [place] [facility];  Service:  Cardiovascular;  Laterality: N/A;  . EYE SURGERY Bilateral    cataracts removed  . MASTOIDECTOMY     L ear  . MITRAL VALVE REPAIR    . MITRAL VALVE REPAIR N/A 08/19/2020   Procedure: MITRAL VALVE REPAIR;  Surgeon: Sherren Mocha, MD;  Location: [place] [facility];  Service: Cardiovascular;  Laterality: N/A;  . PROSTATECTOMY    . RIGHT/LEFT HEART CATH AND CORONARY ANGIOGRAPHY N/A 08/06/2020   Procedure: RIGHT/LEFT HEART CATH AND CORONARY ANGIOGRAPHY;  Surgeon: Sherren Mocha, MD;  Location: [place] [facility];  Service: Cardiovascular;  Laterality: N/A;  . TEE WITHOUT CARDIOVERSION N/A 08/06/2020   Procedure: TRANSESOPHAGEAL ECHOCARDIOGRAM (TEE);  Surgeon:  Sanda Klein, MD;  Location: [facility] ENDOSCOPY;  Service: Cardiovascular;  Laterality: N/A;  . TEE WITHOUT CARDIOVERSION  08/19/2020  . TEE WITHOUT CARDIOVERSION N/A 08/19/2020   Procedure: TRANSESOPHAGEAL ECHOCARDIOGRAM (TEE);  Surgeon: Sherren Mocha, MD;  Location: [place] [facility];  Service: Cardiovascular;  Laterality: N/A;   Social History:  reports that he quit smoking about 43 years ago. His smoking use included cigarettes. He has never used smokeless tobacco. He reports that he does not currently use alcohol. He reports that he does not use drugs.  Allergies  Allergen Reactions  . Simvastatin Other (See Comments)    MUSCLE ACHES REAL BAD  . Atorvastatin     Other reaction(s): stiffness, decreased energy  . Lisinopril Cough  . Rosuvastatin     Other reaction(s): weakness    Family History  Problem Relation Age of Onset  . CAD Neg Hx     Prior to Admission medications   Medication Sig Start Date End Date Taking? Authorizing Provider  aspirin EC 81 MG tablet Take 81 mg by mouth daily. Swallow whole.    [provider]  ciprofloxacin-dexamethasone (CIPRODEX) OTIC suspension Place 4 drops into the left ear 2 (two) times daily as needed (ear infection).    [provider]  FIBER PO Take 1 capsule by mouth daily.    [provider]  furosemide (LASIX) 40 MG tablet TAKE 1 TABLET DAILY 10/10/21   Jerline Pain, MD  gabapentin (NEURONTIN) 100 MG capsule Take 100 mg by mouth at bedtime. 12/31/21   [provider]  losartan (COZAAR) 25 MG tablet TAKE 1 TABLET DAILY 01/16/22   Jerline Pain, MD  metoprolol tartrate (LOPRESSOR) 25 MG tablet Take 25 mg by mouth 2 (two) times daily. 10/24/13   Jerline Pain, MD  Multiple Vitamin (MULTIVITAMIN WITH MINERALS) TABS tablet Take 1 tablet by mouth daily.    [provider]  nitroGLYCERIN (NITROSTAT) 0.4 MG SL tablet PLACE 1 TABLET UNDER THE TONGUE AT ONSET OF CHEST PAIN EVERY 5 MINTUES UP TO 3 TIMES AS  NEEDED 08/03/21   Eileen Stanford, PA-C  Polyvinyl Alcohol-Povidone (REFRESH OP) Place 1 drop into both eyes daily.    [provider]    Physical Exam: Vitals:   04/02/22 1930 04/02/22 2000 04/02/22 2030 04/02/22 2100  BP: (!) 172/71 (!) 172/87 (!) 162/82 (!) 173/84  Pulse: 72 81 73 72  Resp: '17 17 19 16  '$ Temp:      SpO2: 100% 99% 98% 98%  Weight:      Height:       1.  General: Patient lying supine in bed, head of bed elevated, no acute distress   2. Psychiatric: Alert and oriented, mood and behavior normal for situation, pleasant and cooperative with exam   3. Neurologic: Speech  at baseline and language is normal, face is symmetric, moves all 4 extremities voluntarily, right upper extremity weakness compared to the left although he does have a history of right rotator cuff tear, at baseline without acute deficits on limited exam   4. HEENMT:  Bruising over right temple, normocephalic, pupils reactive to light, neck is supple, trachea is midline, minimal loss of lateral visual field on the right mucous membranes are moist   5. Respiratory : Lungs are clear to auscultation bilaterally without wheezing, rhonchi, rales, no cyanosis, no increase in work of breathing or accessory muscle use   6. Cardiovascular : Heart rate normal, rhythm is regular, no murmurs, rubs or gallops, trace peripheral edema, peripheral pulses palpated   7. Gastrointestinal:  Abdomen is soft, nondistended, nontender to palpation bowel sounds active, no masses or organomegaly palpated   8. Skin:  Skin tears right upper extremity   9.Musculoskeletal:  No acute deformities or trauma, no asymmetry in tone, no peripheral edema, peripheral pulses palpated, no tenderness to palpation in the extremities  Data Reviewed: {Tip this will not be part of the note when signed- Document your independent interpretation of telemetry tracing, EKG, lab, Radiology test or any other diagnostic tests. Add any new  diagnostic test ordered today. (Optional):26781} {Results:26384}  Assessment and Plan: * CVA (cerebral vascular accident) ([place]) - Right upper extremity weakness, reported dysarthria - Last known well 4 PM yesterday - Neuro recommends work-up.  [place] - Loaded with Plavix 300 mg - Continue Plavix and aspirin - Inpatient consult to neurology - ST/PT/OT eval and treat - Echo in the a.m., ultrasound carotids - MRI in the a.m. so long as mitral valve clip is compatible - Neurochecks - Continue to monitor  Fall at home, initial encounter - Likely related to CVA - CPK as we do not know how long patient was down - PT eval and treat - C-spine, chest x-ray, x-ray pelvis without acute changes - CT head shows acute infarct, but no bleed - Continue to monitor  Hypokalemia - 40 mEq potassium at admission - Trend in the a.m.  Chronic kidney disease, stage 3b (HCC) - Creatinine 1.61 at baseline - Would avoid nephrotoxic agents when possible - Continue to monitor  Essential hypertension - Holding losartan and metoprolol in the setting of permissive hypertension for 24 hours      Advance Care Planning:   Code Status: Prior ***  Consults: ***  Family Communication: ***  Severity of Illness: {Observation/Inpatient:21159}  Author: Rolla Plate, DO 04/02/2022 11:24 PM  For on call review [email or web address].

## 2022-04-02 NOTE — ED Triage Notes (Signed)
A neighbor went to check on patient and found patient unable to form sentences, in his own urine with multiple skin abrasions and bruises to bilateral eyes. Patient states he does not remember if he fell, but obvious skin tear to right upper and lower arm with tear to right hand. Patient has multiple hematomas to face and foul smelling urine. Per daughter patient is able to care for himself and is independent. Patient alert & oriented X2 and having mild dysarthria.

## 2022-04-02 NOTE — ED Triage Notes (Signed)
Last seen well yesterday afternoon.

## 2022-04-02 NOTE — Assessment & Plan Note (Addendum)
-   Likely related to CVA - No fractures appreciated -Physical therapy has seen patient with recommendation for CIR -Continue supportive care and assist -Maintain adequate hydration.

## 2022-04-03 ENCOUNTER — Observation Stay (HOSPITAL_COMMUNITY): Payer: Medicare Other

## 2022-04-03 ENCOUNTER — Inpatient Hospital Stay (HOSPITAL_COMMUNITY)
Admit: 2022-04-03 | Discharge: 2022-04-03 | Disposition: A | Discharge status: Home / Self Care | Payer: Medicare Other | Attending: Family Medicine | Admitting: Family Medicine

## 2022-04-03 DIAGNOSIS — I63532 Cerebral infarction due to unspecified occlusion or stenosis of left posterior cerebral artery: Secondary | ICD-10-CM | POA: Diagnosis not present

## 2022-04-03 DIAGNOSIS — K219 Gastro-esophageal reflux disease without esophagitis: Secondary | ICD-10-CM | POA: Diagnosis not present

## 2022-04-03 DIAGNOSIS — Z87891 Personal history of nicotine dependence: Secondary | ICD-10-CM | POA: Diagnosis not present

## 2022-04-03 DIAGNOSIS — I639 Cerebral infarction, unspecified: Secondary | ICD-10-CM | POA: Diagnosis present

## 2022-04-03 DIAGNOSIS — Z9079 Acquired absence of other genital organ(s): Secondary | ICD-10-CM | POA: Diagnosis not present

## 2022-04-03 DIAGNOSIS — Z79899 Other long term (current) drug therapy: Secondary | ICD-10-CM | POA: Diagnosis not present

## 2022-04-03 DIAGNOSIS — I63432 Cerebral infarction due to embolism of left posterior cerebral artery: Secondary | ICD-10-CM | POA: Diagnosis present

## 2022-04-03 DIAGNOSIS — I6312 Cerebral infarction due to embolism of basilar artery: Secondary | ICD-10-CM

## 2022-04-03 DIAGNOSIS — R55 Syncope and collapse: Secondary | ICD-10-CM | POA: Diagnosis not present

## 2022-04-03 DIAGNOSIS — Z888 Allergy status to other drugs, medicaments and biological substances status: Secondary | ICD-10-CM | POA: Diagnosis not present

## 2022-04-03 DIAGNOSIS — I6523 Occlusion and stenosis of bilateral carotid arteries: Secondary | ICD-10-CM | POA: Diagnosis not present

## 2022-04-03 DIAGNOSIS — I25810 Atherosclerosis of coronary artery bypass graft(s) without angina pectoris: Secondary | ICD-10-CM | POA: Diagnosis not present

## 2022-04-03 DIAGNOSIS — I2510 Atherosclerotic heart disease of native coronary artery without angina pectoris: Secondary | ICD-10-CM | POA: Diagnosis present

## 2022-04-03 DIAGNOSIS — R4182 Altered mental status, unspecified: Secondary | ICD-10-CM

## 2022-04-03 DIAGNOSIS — Z955 Presence of coronary angioplasty implant and graft: Secondary | ICD-10-CM | POA: Diagnosis not present

## 2022-04-03 DIAGNOSIS — Z8546 Personal history of malignant neoplasm of prostate: Secondary | ICD-10-CM | POA: Diagnosis not present

## 2022-04-03 DIAGNOSIS — N1832 Chronic kidney disease, stage 3b: Secondary | ICD-10-CM | POA: Diagnosis present

## 2022-04-03 DIAGNOSIS — Z7982 Long term (current) use of aspirin: Secondary | ICD-10-CM | POA: Diagnosis not present

## 2022-04-03 DIAGNOSIS — I739 Peripheral vascular disease, unspecified: Secondary | ICD-10-CM | POA: Diagnosis present

## 2022-04-03 DIAGNOSIS — I252 Old myocardial infarction: Secondary | ICD-10-CM | POA: Diagnosis not present

## 2022-04-03 DIAGNOSIS — I129 Hypertensive chronic kidney disease with stage 1 through stage 4 chronic kidney disease, or unspecified chronic kidney disease: Secondary | ICD-10-CM | POA: Diagnosis present

## 2022-04-03 DIAGNOSIS — H5347 Heteronymous bilateral field defects: Secondary | ICD-10-CM | POA: Diagnosis present

## 2022-04-03 DIAGNOSIS — J45909 Unspecified asthma, uncomplicated: Secondary | ICD-10-CM | POA: Diagnosis present

## 2022-04-03 DIAGNOSIS — R471 Dysarthria and anarthria: Secondary | ICD-10-CM | POA: Diagnosis present

## 2022-04-03 DIAGNOSIS — M79662 Pain in left lower leg: Secondary | ICD-10-CM | POA: Diagnosis not present

## 2022-04-03 DIAGNOSIS — I340 Nonrheumatic mitral (valve) insufficiency: Secondary | ICD-10-CM | POA: Diagnosis not present

## 2022-04-03 DIAGNOSIS — I10 Essential (primary) hypertension: Secondary | ICD-10-CM | POA: Diagnosis not present

## 2022-04-03 DIAGNOSIS — I6389 Other cerebral infarction: Secondary | ICD-10-CM

## 2022-04-03 DIAGNOSIS — R29710 NIHSS score 10: Secondary | ICD-10-CM | POA: Diagnosis present

## 2022-04-03 DIAGNOSIS — R4701 Aphasia: Secondary | ICD-10-CM | POA: Diagnosis present

## 2022-04-03 DIAGNOSIS — E785 Hyperlipidemia, unspecified: Secondary | ICD-10-CM | POA: Diagnosis present

## 2022-04-03 DIAGNOSIS — M79661 Pain in right lower leg: Secondary | ICD-10-CM | POA: Diagnosis not present

## 2022-04-03 DIAGNOSIS — Y92009 Unspecified place in unspecified non-institutional (private) residence as the place of occurrence of the external cause: Secondary | ICD-10-CM | POA: Diagnosis not present

## 2022-04-03 DIAGNOSIS — C61 Malignant neoplasm of prostate: Secondary | ICD-10-CM | POA: Diagnosis not present

## 2022-04-03 DIAGNOSIS — G8321 Monoplegia of upper limb affecting right dominant side: Secondary | ICD-10-CM | POA: Diagnosis present

## 2022-04-03 DIAGNOSIS — W19XXXA Unspecified fall, initial encounter: Secondary | ICD-10-CM | POA: Diagnosis not present

## 2022-04-03 DIAGNOSIS — E876 Hypokalemia: Secondary | ICD-10-CM | POA: Diagnosis present

## 2022-04-03 DIAGNOSIS — I69351 Hemiplegia and hemiparesis following cerebral infarction affecting right dominant side: Secondary | ICD-10-CM | POA: Diagnosis not present

## 2022-04-03 DIAGNOSIS — Z8249 Family history of ischemic heart disease and other diseases of the circulatory system: Secondary | ICD-10-CM | POA: Diagnosis not present

## 2022-04-03 LAB — COMPREHENSIVE METABOLIC PANEL
ALT: 17 U/L (ref 0–44)
AST: 24 U/L (ref 15–41)
Albumin: 3.1 g/dL — ABNORMAL LOW (ref 3.5–5.0)
Alkaline Phosphatase: 58 U/L (ref 38–126)
Anion gap: 9 (ref 5–15)
BUN: 33 mg/dL — ABNORMAL HIGH (ref 8–23)
CO2: 23 mmol/L (ref 22–32)
Calcium: 8.4 mg/dL — ABNORMAL LOW (ref 8.9–10.3)
Chloride: 112 mmol/L — ABNORMAL HIGH (ref 98–111)
Creatinine, Ser: 1.42 mg/dL — ABNORMAL HIGH (ref 0.61–1.24)
GFR, Estimated: 48 mL/min — ABNORMAL LOW (ref >60)
Glucose, Bld: 89 mg/dL (ref 70–99)
Potassium: 3.8 mmol/L (ref 3.5–5.1)
Sodium: 144 mmol/L (ref 135–145)
Total Bilirubin: 1 mg/dL (ref 0.3–1.2)
Total Protein: 5.7 g/dL — ABNORMAL LOW (ref 6.5–8.1)

## 2022-04-03 LAB — ECHOCARDIOGRAM COMPLETE
AR max vel: 1.65 cm2
AV Area VTI: 1.74 cm2
AV Area mean vel: 1.58 cm2
AV Mean grad: 5.5 mmHg
AV Peak grad: 11.1 mmHg
Ao pk vel: 1.67 m/s
Area-P 1/2: 1.91 cm2
Height: 67 in
MV VTI: 1.92 cm2
S' Lateral: 3.4 cm
Weight: 2574.97 oz

## 2022-04-03 LAB — CBC
HCT: 35.4 % — ABNORMAL LOW (ref 39.0–52.0)
Hemoglobin: 11.5 g/dL — ABNORMAL LOW (ref 13.0–17.0)
MCH: 31.6 pg (ref 26.0–34.0)
MCHC: 32.5 g/dL (ref 30.0–36.0)
MCV: 97.3 fL (ref 80.0–100.0)
Platelets: 233 10*3/uL (ref 150–400)
RBC: 3.64 MIL/uL — ABNORMAL LOW (ref 4.22–5.81)
RDW: 12.5 % (ref 11.5–15.5)
WBC: 8.9 10*3/uL (ref 4.0–10.5)
nRBC: 0 % (ref 0.0–0.2)

## 2022-04-03 LAB — LIPID PANEL
Cholesterol: 123 mg/dL (ref 0–200)
HDL: 46 mg/dL (ref >40)
LDL Cholesterol: 69 mg/dL (ref 0–99)
Total CHOL/HDL Ratio: 2.7 RATIO
Triglycerides: 38 mg/dL (ref <150)
VLDL: 8 mg/dL (ref 0–40)

## 2022-04-03 LAB — HEMOGLOBIN A1C
Hgb A1c MFr Bld: 5.5 % (ref 4.8–5.6)
Mean Plasma Glucose: 111.15 mg/dL

## 2022-04-03 MED ORDER — POTASSIUM CHLORIDE 10 MEQ/100ML IV SOLN
10.0000 meq | INTRAVENOUS | Status: AC
  Administered 2022-04-03: 10 meq via INTRAVENOUS
  Filled 2022-04-03: qty 100

## 2022-04-03 MED ORDER — ACETAMINOPHEN 650 MG RE SUPP: 650.0000 mg | RECTAL | Status: DC | PRN

## 2022-04-03 MED ORDER — ASPIRIN 81 MG PO TBEC
81.0000 mg | DELAYED_RELEASE_TABLET | Freq: Every day | ORAL | Status: DC
  Administered 2022-04-03 – 2022-04-07 (×5): 81 mg via ORAL
  Filled 2022-04-03 (×5): qty 1

## 2022-04-03 MED ORDER — GABAPENTIN 100 MG PO CAPS
100.0000 mg | ORAL_CAPSULE | Freq: Every day | ORAL | Status: DC
  Administered 2022-04-03 – 2022-04-06 (×4): 100 mg via ORAL
  Filled 2022-04-03 (×4): qty 1

## 2022-04-03 MED ORDER — SENNOSIDES-DOCUSATE SODIUM 8.6-50 MG PO TABS: 1.0000 | ORAL_TABLET | Freq: Every evening | ORAL | Status: DC | PRN

## 2022-04-03 MED ORDER — POTASSIUM CHLORIDE 10 MEQ/100ML IV SOLN
10.0000 meq | INTRAVENOUS | Status: AC
  Administered 2022-04-03 (×3): 10 meq via INTRAVENOUS
  Filled 2022-04-03 (×3): qty 100

## 2022-04-03 MED ORDER — CLOPIDOGREL BISULFATE 75 MG PO TABS
75.0000 mg | ORAL_TABLET | Freq: Every day | ORAL | Status: DC
  Administered 2022-04-03 – 2022-04-07 (×5): 75 mg via ORAL
  Filled 2022-04-03 (×5): qty 1

## 2022-04-03 MED ORDER — OXYCODONE HCL 5 MG PO TABS: 5.0000 mg | ORAL_TABLET | ORAL | Status: DC | PRN

## 2022-04-03 MED ORDER — STROKE: EARLY STAGES OF RECOVERY BOOK
Freq: Once | Status: AC
  Filled 2022-04-03: qty 1

## 2022-04-03 MED ORDER — ACETAMINOPHEN 160 MG/5ML PO SOLN: 650.0000 mg | ORAL | Status: DC | PRN

## 2022-04-03 MED ORDER — HEPARIN SODIUM (PORCINE) 5000 UNIT/ML IJ SOLN
5000.0000 [IU] | Freq: Three times a day (TID) | INTRAMUSCULAR | Status: DC
  Administered 2022-04-03 – 2022-04-07 (×13): 5000 [IU] via SUBCUTANEOUS
  Filled 2022-04-03 (×12): qty 1

## 2022-04-03 MED ORDER — ACETAMINOPHEN 325 MG PO TABS: 650.0000 mg | ORAL_TABLET | ORAL | Status: DC | PRN

## 2022-04-03 NOTE — Evaluation (Signed)
Speech Language Pathology Evaluation Patient Details Name: Adam Arellano MRN: [id number] DOB: 1931/10/10 Today's Date: 04/03/2022 Time: 8891-6945 SLP Time Calculation (min) (ACUTE ONLY): 31 min  Problem List:  Patient Active Problem List   Diagnosis Date Noted   CVA (cerebral vascular accident) ([place]) 04/02/2022   Hypokalemia 04/02/2022   Fall at home, initial encounter 04/02/2022   Mitral valve disorder 01/16/2022   Paresthesia 01/16/2022   Exudative age-related macular degeneration of left eye with active choroidal neovascularization ([place]) 11/14/2021   Advanced nonexudative age-related macular degeneration of right eye without subfoveal involvement 11/14/2021   Advanced nonexudative age-related macular degeneration of left eye without subfoveal involvement 11/14/2021   S/P mitral valve clip implantation 08/19/2020   Prostate cancer ([place])    PVC's (premature ventricular contractions)    Peripheral vascular disease ([place])    Coronary artery disease    Nonrheumatic mitral valve regurgitation    Anemia in chronic kidney disease 04/24/2020   Chronic kidney disease, stage 3b ([place]) 04/24/2020   History of malignant neoplasm of prostate 04/24/2020   Hypertensive renal disease 04/24/2020   Otorrhea of left ear 01/12/2020   Tympanic membrane perforation, left 01/12/2020   Acute non-recurrent sinusitis 08/07/2018   Tympanic membrane perforation, marginal, right 08/07/2018   Cholesteatoma of attic of ear, left 01/23/2017   Conductive hearing loss of both ears 01/23/2017   Laryngopharyngeal reflux (LPR) 01/23/2017   Perennial allergic rhinitis 01/23/2017   Sensorineural hearing loss (SNHL), bilateral 08/17/2015   Bilateral chronic secretory otitis media 08/09/2015   Chronic swimmer's ear of both sides 08/09/2015   History of colonic polyps 08/09/2015   Dysfunction of both eustachian tubes 07/19/2015   Essential hypertension 02/27/2014   Hyperlipidemia 06/10/2013   Past Medical  History:  Past Medical History:  Diagnosis Date   Asthma    a. Childhood.   Coronary artery disease    a. NSTEMI s/p DES to dRCA 2007. b. Angina 07/2009: s/p DESx2 to mid RCA for ISR and prox RCA, EF 65%. c. Stress test 05/2013: low risk, no evidence of ischemia, EF 61%.   Dyslipidemia    GERD (gastroesophageal reflux disease)    History of kidney stones    Hypertension    Peripheral vascular disease ([facility])    Prostate cancer ([place])    a. s/p radical prostatectomy.   PVC's (premature ventricular contractions)    S/P mitral valve clip implantation 08/19/2020   s/p MitraClip implantation with a XTW by Dr. Burt Knack    Transient global amnesia    a. Adm 2009: imaging nonacute, had resolved.   Past Surgical History:  Past Surgical History:  Procedure Laterality Date   CARDIAC CATHETERIZATION     COLONOSCOPY     CORONARY STENT INTERVENTION N/A 08/06/2020   Procedure: CORONARY STENT INTERVENTION;  Surgeon: Sherren Mocha, MD;  Location: [facility];  Service: Cardiovascular;  Laterality: N/A;   EYE SURGERY Bilateral    cataracts removed   MASTOIDECTOMY     L ear   MITRAL VALVE REPAIR     MITRAL VALVE REPAIR N/A 08/19/2020   Procedure: MITRAL VALVE REPAIR;  Surgeon: Sherren Mocha, MD;  Location: [facility];  Service: Cardiovascular;  Laterality: N/A;   PROSTATECTOMY     RIGHT/LEFT HEART CATH AND CORONARY ANGIOGRAPHY N/A 08/06/2020   Procedure: RIGHT/LEFT HEART CATH AND CORONARY ANGIOGRAPHY;  Surgeon: Sherren Mocha, MD;  Location: [facility];  Service: Cardiovascular;  Laterality: N/A;   TEE WITHOUT CARDIOVERSION N/A 08/06/2020   Procedure:  TRANSESOPHAGEAL ECHOCARDIOGRAM (TEE);  Surgeon: Sanda Klein, MD;  Location: [facility];  Service: Cardiovascular;  Laterality: N/A;   TEE WITHOUT CARDIOVERSION  08/19/2020   TEE WITHOUT CARDIOVERSION N/A 08/19/2020   Procedure: TRANSESOPHAGEAL ECHOCARDIOGRAM (TEE);  Surgeon: Sherren Mocha, MD;  Location: [facility];   Service: Cardiovascular;  Laterality: N/A;   HPI:  Adam Arellano is a 88 y.o. male with past medical history of transient global amnesia in 2009, hypertension, hyperlipidemia, coronary artery disease with NSTEMI status post multiple PCI's, prostate cancer status post radical prostatectomy, severe mitral regurgitation status post TEER ( 08/19/2020). Patient doesn't remember what happened yesterday.  Per daughter at bedside, she spoke with patient on Saturday 03/25/2019.  Around 4 PM when he was his usual self.  Yesterday afternoon Mult[facility] patient initially did not pick up and when he did pick up he started confused.  Therefore daughter called EMS.  When EMS went to his house, he was laying in bed had some bruises and urinary incontinence.  He was brought to [facility], ED and admitted for further work-up.  CT head was done showed acute stroke. SLE requested   Assessment / Plan / Recommendation Clinical Impression  Speech language evaluation completed; Pt presents with moderate cognitive impairment and fluent expressive aphasia (Pt masks aphasia with substitutions, joking and good access to automatic phrases). Regarding cognition, Pt achieved a score of 11/28 (two points removed secondary to Pt being unable to see the shapes) on the [employer] slums indicating moderate cognitive impairment. Pt demonstrates awareness and frustration with his inability to calculate, recall, problem solve and answer questions asked on assessment (chart below reveals findings in further detail). Expressive aphasia is characterized by naming 2/10 objects in the room - Note one neologism but majority of the time Pt generates a similar word (substitution) or makes jokes, explains the word and sometimes generates the appropriate word after circumlocution. Again, Pt demonstrates awareness and frustration regarding his deficits. Pt will benefit from ongoing ST in acute setting for aphasia and cognitive impairment. Recommend venue below for  more in depth assessment and treatment upon d/c.    [place] Examination Orientation  2/3  Numeric Problem Solving  1/3  Memory  0/5  Attention 0/2  Thought Organization 1/3  Clock Drawing 4/4 - Pt unable to write with R hand so Pt pointed and told SLP where to place numbers and hands for drawing  Visuospatial Skills               x/x  Short Story Recall  3/8  Total  11/28    Scoring  High School Education  Less than High School Education   Normal  27-30 25-30  Mild Neurocognitive Disorder 21-26 20-24  Dementia  1-20 1-19       SLP Assessment  SLP Recommendation/Assessment: Patient needs continued Speech Lanaguage Pathology Services SLP Visit Diagnosis: Aphasia (R47.01);Cognitive communication deficit (R41.841)    Recommendations for follow up therapy are one component of a multi-disciplinary discharge planning process, led by the attending physician.  Recommendations may be updated based on patient status, additional functional criteria and insurance authorization.    Follow Up Recommendations  Acute inpatient rehab (3hours/day)    Assistance Recommended at Discharge  Frequent or constant Supervision/Assistance  Functional Status Assessment    Frequency and Duration min 2x/week  1 week      SLP Evaluation Cognition  Overall Cognitive Status: Impaired/Different from baseline Orientation Level: Oriented to person;Oriented to place;Oriented to time;Disoriented to situation  Year: 2021 Day of Week: Correct Attention: Focused Memory: Impaired Memory Impairment: Decreased short term memory Decreased Short Term Memory: Verbal basic Awareness: Impaired Awareness Impairment: Anticipatory impairment Executive Function: Reasoning;Organizing Reasoning: Impaired Reasoning Impairment: Verbal basic Organizing: Impaired Organizing Impairment: Verbal basic       Comprehension  Auditory Comprehension Overall Auditory Comprehension: Appears within functional limits for tasks  assessed    Expression Verbal Expression Overall Verbal Expression: Impaired Initiation: No impairment Level of Generative/Spontaneous Verbalization: Conversation Repetition: No impairment Naming: Impairment Responsive: 51-75% accurate Confrontation: Impaired Convergent: 75-100% accurate Divergent: 75-100% accurate Verbal Errors: Neologisms;Aware of errors (Substitutions) Pragmatics: No impairment Written Expression Dominant Hand: Right Written Expression: Unable to assess (comment)   Oral / Motor  Motor Speech Overall Motor Speech: Appears within functional limits for tasks assessed              Prathik Aman H. Roddie Mc, CCC-SLP Speech Language Pathologist  Wende Bushy 04/03/2022, 10:53 AM

## 2022-04-03 NOTE — Evaluation (Signed)
Occupational Therapy Evaluation Patient Details Name: Adam Arellano MRN: [id number] DOB: March 15, 1932 Today's Date: 04/03/2022   History of Present Illness 88 y.o. M admitted on 04/02/22 due to being found at home lying in bed covered in bruises, bleeding, and with urine incontinence. CT positive for an acute stroke in the L posterior cerbral artery territory. PMH significant for  transient global amnesia in 2009, hypertension, hyperlipidemia, coronary artery disease with NSTEMI status post multiple PCI's, prostate cancer status post radical prostatectomy, severe mitral regurgitation status post TEER ( 08/19/2020).   Clinical Impression   Pt admitted for concerns listed above. PTA pt and family reported that he was very independent with all ADL's and IADL's, including still working and driving. At this time, pt demonstrates increased R sided inattention, weakness, and ataxia. He is requiring mod-max A for all ADL's and functional mobility. Recommending AIR to maximize his independence and safety. OT will follow acutely.       Recommendations for follow up therapy are one component of a multi-disciplinary discharge planning process, led by the attending physician.  Recommendations may be updated based on patient status, additional functional criteria and insurance authorization.   Follow Up Recommendations  Acute inpatient rehab (3hours/day)    Assistance Recommended at Discharge Frequent or constant Supervision/Assistance  Patient can return home with the following A lot of help with walking and/or transfers;A lot of help with bathing/dressing/bathroom;Assistance with cooking/housework;Direct supervision/assist for medications management;Assistance with feeding;Direct supervision/assist for financial management;Assist for transportation;Help with stairs or ramp for entrance    Functional Status Assessment  Patient has had a recent decline in their functional status and demonstrates the ability  to make significant improvements in function in a reasonable and predictable amount of time.  Equipment Recommendations  BSC/3in1    Recommendations for Other Services Rehab consult     Precautions / Restrictions Precautions Precautions: Fall Restrictions Weight Bearing Restrictions: No      Mobility Bed Mobility Overal bed mobility: Needs Assistance Bed Mobility: Supine to Sit     Supine to sit: Min guard     General bed mobility comments: Increased time and effort to sit EOB    Transfers Overall transfer level: Needs assistance Equipment used: Rolling walker (2 wheels) Transfers: Sit to/from Stand Sit to Stand: Mod assist, +2 safety/equipment           General transfer comment: Mod A due to decreased awareness and poor balance, benefits from 2nd person as he wanders to the R side and needs constant cuing for RW safety      Balance Overall balance assessment: No apparent balance deficits (not formally assessed)                                         ADL either performed or assessed with clinical judgement   ADL Overall ADL's : Needs assistance/impaired Eating/Feeding: Minimal assistance;Sitting   Grooming: Minimal assistance;Sitting   Upper Body Bathing: Moderate assistance;Sitting   Lower Body Bathing: Maximal assistance;Sit to/from stand;Sitting/lateral leans   Upper Body Dressing : Moderate assistance;Sitting   Lower Body Dressing: Maximal assistance;Sitting/lateral leans;Sit to/from stand   Toilet Transfer: Moderate assistance;+2 for safety/equipment;Ambulation   Toileting- Clothing Manipulation and Hygiene: Maximal assistance;+2 for safety/equipment;Sitting/lateral lean;Sit to/from stand       Functional mobility during ADLs: Moderate assistance;Rolling walker (2 wheels);+2 for safety/equipment General ADL Comments: Increased assist for safety and due to decreased awareness  of deficits     Vision Baseline Vision/History: 1  Wears glasses Ability to See in Adequate Light: 2 Moderately impaired Patient Visual Report: Peripheral vision impairment Vision Assessment?: Yes Eye Alignment: Within Functional Limits Ocular Range of Motion: Restricted on the right Alignment/Gaze Preference: Gaze left Tracking/Visual Pursuits: Decreased smoothness of horizontal tracking;Decreased smoothness of eye movement to RIGHT superior field;Decreased smoothness of eye movement to RIGHT inferior field Saccades: Additional eye shifts occurred during testing Convergence: Impaired - to be further tested in functional context Visual Fields: Right visual field deficit Depth Perception: Overshoots Additional Comments: Severe field cut on R side, all the way to midline     Perception     Praxis      Pertinent Vitals/Pain Pain Assessment Pain Assessment: No/denies pain     Hand Dominance Right   Extremity/Trunk Assessment Upper Extremity Assessment Upper Extremity Assessment: RUE deficits/detail RUE Deficits / Details: Ataxic movements, globally weak, decreased ROM due to past rotator cuff injury RUE Sensation: decreased light touch;decreased proprioception RUE Coordination: decreased fine motor;decreased gross motor   Lower Extremity Assessment Lower Extremity Assessment: Defer to PT evaluation   Cervical / Trunk Assessment Cervical / Trunk Assessment: Kyphotic   Communication Communication Communication: No difficulties   Cognition Arousal/Alertness: Awake/alert Behavior During Therapy: WFL for tasks assessed/performed Overall Cognitive Status: Impaired/Different from baseline Area of Impairment: Orientation, Attention, Memory, Following commands, Safety/judgement, Awareness, Problem solving                 Orientation Level: Disoriented to, Place, Situation Current Attention Level: Selective Memory: Decreased short-term memory, Decreased recall of precautions Following Commands: Follows one step commands  consistently, Follows one step commands inconsistently Safety/Judgement: Decreased awareness of safety, Decreased awareness of deficits Awareness: Emergent Problem Solving: Slow processing, Difficulty sequencing, Requires verbal cues General Comments: Pt with decreased awareness to the R side, decreased memory and attention levels. Requiring max multimodal cuing throughout session     General Comments  VSS on RA, daughter and husband present    Exercises     Shoulder Instructions      Home Living Family/patient expects to be discharged to:: Private residence Living Arrangements: Alone Available Help at Discharge: Family Type of Home: House Home Access: Level entry     Home Layout: Laundry or work area in basement;Two level;Able to live on main level with bedroom/bathroom Alternate Level Stairs-Number of Steps: 15 steps   Bathroom Shower/Tub: Walk-in shower;Tub/shower unit   Bathroom Toilet: Standard     Home Equipment: Conservation officer, nature (2 wheels);Shower seat - built in   Additional Comments: Per daughter, he will be living with her and her husband once discharged  Lives With: Alone    Prior Functioning/Environment Prior Level of Function : Independent/Modified Independent;Working/employed;Driving             Mobility Comments: No AD ADLs Comments: Completely indep with all ADL's and IADL's        OT Problem List: Decreased strength;Decreased range of motion;Decreased activity tolerance;Impaired balance (sitting and/or standing);Impaired vision/perception;Decreased coordination;Decreased cognition;Decreased safety awareness;Decreased knowledge of use of DME or AE;Impaired sensation;Impaired UE functional use      OT Treatment/Interventions: Self-care/ADL training;Therapeutic exercise;Neuromuscular education;Energy conservation;DME and/or AE instruction;Therapeutic activities;Visual/perceptual remediation/compensation;Cognitive remediation/compensation;Patient/family  education;Balance training    OT Goals(Current goals can be found in the care plan section) Acute Rehab OT Goals Patient Stated Goal: To get his independence back OT Goal Formulation: With patient/family Time For Goal Achievement: 04/17/22 Potential to Achieve Goals: Good ADL Goals Pt Will Perform Grooming:  with set-up;sitting Pt Will Perform Lower Body Bathing: with min assist;sitting/lateral leans;sit to/from stand Pt Will Perform Lower Body Dressing: with min assist;sitting/lateral leans;sit to/from stand Pt Will Transfer to Toilet: with min assist;ambulating Pt Will Perform Toileting - Clothing Manipulation and hygiene: with min assist;sit to/from stand;sitting/lateral leans  OT Frequency: Min 2X/week    Co-evaluation              AM-PAC OT "6 Clicks" Daily Activity     Outcome Measure Help from another person eating meals?: A Little Help from another person taking care of personal grooming?: A Little Help from another person toileting, which includes using toliet, bedpan, or urinal?: A Lot Help from another person bathing (including washing, rinsing, drying)?: A Lot Help from another person to put on and taking off regular upper body clothing?: A Lot Help from another person to put on and taking off regular lower body clothing?: A Lot 6 Click Score: 14   End of Session Equipment Utilized During Treatment: Gait belt;Rolling walker (2 wheels) Nurse Communication: Mobility status  Activity Tolerance: Patient tolerated treatment well Patient left: in chair;with call bell/phone within reach;with chair alarm set;with family/visitor present  OT Visit Diagnosis: Unsteadiness on feet (R26.81);Other abnormalities of gait and mobility (R26.89);Muscle weakness (generalized) (M62.81);Hemiplegia and hemiparesis Hemiplegia - Right/Left: Right Hemiplegia - dominant/non-dominant: Dominant Hemiplegia - caused by: Cerebral infarction                Time: 6387-5643 OT Time Calculation  (min): 32 min Charges:  OT General Charges $OT Visit: 1 Visit OT Evaluation $OT Eval Moderate Complexity: 1 Mod OT Treatments $Self Care/Home Management : 8-22 mins  Takeru Bose Yolanda Bonine, OTR/[place] 04/03/2022, 12:15 PM

## 2022-04-03 NOTE — Progress Notes (Signed)
EEG tech at bedside to complete test.

## 2022-04-03 NOTE — Progress Notes (Signed)
Progress Note   Patient: Adam Arellano NUU:[id number] DOB: August 09, 1931 DOA: 04/02/2022     0 DOS: the patient was seen and examined on 04/03/2022   Brief hospital course: As per H&P written by Dr. Clearence Ped on 04/02/22 Adam Arellano is a 88 y.o. male with medical history significant of history of coronary artery disease, hyperlipidemia, GERD, hypertension, status post mitral valve clip implant, and more presents to the ED with a chief complaint of altered mental status.  Is reported the daughter spoke to patient last yesterday around 4 PM and he was normal at that time.  Patient reports that he was normal at the time that he went to bed.  Reports he was also normal when he got up this morning.  He reports that he remembers eating breakfast and felt that he was in his normal state of health at that time as well.,  And then daughter was trying to get a hold of him later in the day and could not reach him.  She sent somebody to check on him and he was found in his bed saturated in urine, with bruises and blood around, everything knocked off the nightstand, and with slurred speech.  Patient reports he does not remember that.  He does not remember falling.  Reports he is not in any pain right now.  He does not remember having a headache, dizziness, chest pain, palpitations, dyspnea.  Daughter reports at baseline he has clear speech, lives independently, takes care of his own ADLs.  He has no history of tremor, although there is a right upper extremity tremor at this time.  He has no history of contracture although he is holding his right upper extremity in a fist, unless he really focuses on opening his hand.  Patient is no longer slurred, but it is reported that it was slurred when he was found.  He also had incoherent speech where the words were not making sense when he was found.  Patient is not able to contribute any more to this history.  Of note, he does have a mitral valve clip and compatibility  with MRI is unknown.   Patient does not smoke, does not drink.  He is vaccinated for COVID.  Patient is full code.  Assessment and Plan: * CVA (cerebral vascular accident) ([place]) - Right upper extremity weakness, reported dysarthria when he was found and is experiencing right hemianopsia. -Patient has past swallowing test -LDL 69, A1c 5.5 -MRI demonstrating left PCA infarct -Following recommendations by neurology service will continue aspirin and Plavix for 67-monthwith the use of subsequent aspirin for secondary prevention. -Physical therapy recommending CIR -Complete work-up with 2D echo and EEG. -Allowing for permissive hypertension. -Patient will also need 30 days event monitoring  Fall at home, initial encounter - Likely related to CVA - No fractures appreciated -Physical therapy has seen patient with recommendation for CIR -Continue supportive care and assist -Maintain adequate hydration.  Hypokalemia - Repleted and within normal limits currently -Continue to follow electrolytes trend.  Chronic kidney disease, stage 3b (HCC) -Creatinine 1.61 at baseline - Appears to be stable -Maintain adequate hydration -Continue to follow renal function trend.  Essential hypertension - Follow-up vital signs -Blood pressure overall is stable while allowing permissive hypertension -Slowly resume back antihypertensive agents -Heart healthy diet discussed with patient.   Subjective:  Patient has passed swallowing evaluation; no major aphasia identified despite difficulty finding words.  Right-sided weakness especially affecting his upper extremity and right hemianopsia appreciated on  exam.  No chest pain, no nausea, no vomiting, no fever.  Physical Exam: Vitals:   04/03/22 0700 04/03/22 0910 04/03/22 0931 04/03/22 1400  BP: (!) 171/78  (!) 175/94 (!) 184/74  Pulse: 78  71 84  Resp: '13  16 18  '$ Temp:   98.4 F (36.9 C) 98.3 F (36.8 C)  TempSrc:   Oral Oral  SpO2: 99%  100% 98%   Weight:  68.4 kg    Height:  '5\' 9"'$  (1.753 m)     General exam: Alert, awake able to follow simple commands; demonstrating right sided blindness, with positive right upper extremity weakness and decreased fine motor skills. Respiratory system: Clear to auscultation. Respiratory effort normal.  Good saturation on room air. Cardiovascular system:RRR. No rubs or gallops. Gastrointestinal system: Abdomen is nondistended, soft and nontender. No organomegaly or masses felt. Normal bowel sounds heard. Central nervous system: Right hemianopsia, upper extremity weakness and decreased fine motor skills ability.  Some difficulty finding words without major aphasia currently.  Extremities: No cyanosis or clubbing. Skin: No petechiae. Psychiatry: Mood & affect appropriate.   Data Reviewed: Lipid panel total cholesterol 123, HDL 46, LDL 69 and triglycerides 38 A1C: 5.5 Comprehensive metabolic panel: Sodium 983, potassium 3.8, chloride 112, BUN 33, creatinine 1.42; normal LFTs.  GFR 48 CBC: WBC 7.9, hemoglobin 11.5 and platelet count 233 K. MRI: Acute left PCA territory infarct; MRI occlusion (favored) versus severe stenosis of the left P2 PCA.  Approximately 3 mm outpouching arising from the left MCA bifurcation, concerning for aneurysm.  Carotid ultrasound demonstrating right-sided 50 to 69% stenosis in his ICA; no significant stenosis or plaque formation appreciated on his left carotids.  2D echo: Pending EEG: Pending  Family Communication: No family at bedside.  Disposition: Status is: Inpatient Remains inpatient appropriate because: Completing stroke work-up.  2D echo and EEG pending at this time; per physical therapy recommendations patient will benefit of CIR at time of discharge.   Planned Discharge Destination: To be determined; CIR versus skilled nursing facility.  Time spent: 35 minutes  Author: Barton Dubois, MD 04/03/2022 5:30 PM  For on call review [email or web address].

## 2022-04-03 NOTE — Progress Notes (Signed)
Inpatient Rehab Admissions Coordinator:  ? ?Per therapy recommendations,  patient was screened for CIR candidacy by Colman Birdwell, MS, CCC-SLP. At this time, Pt. Appears to be a a potential candidate for CIR. I will place   order for rehab consult per protocol for full assessment. Please contact me any with questions. ? ?Keiran Gaffey, MS, CCC-SLP ?Rehab Admissions Coordinator  ?[phone] (celll) ?[phone] (office) ? ?

## 2022-04-03 NOTE — Progress Notes (Signed)
Pt able to feed self ice cream using a spoon, ataxia noted in right hand/arm. Swallows both ice cream and water without difficulty. Pt remains unable to read any words, can identify pictures. Answers yes/no questions correctly, otherwise has "word salad" type expressive aphasia. Denies any c/o pain.

## 2022-04-03 NOTE — Progress Notes (Signed)
  Transition of Care [facility]) Screening Note   Patient Details  Name: Adam Arellano Date of Birth: [birth year]   Transition of Care [facility]) CM/SW Contact:    Iona Beard, [place] Phone Number: 04/03/2022, 2:03 PM  CSW updated that PT/OT is recommending CIR for pt. TOC to follow for needs.   Transition of Care Department [facility]) has reviewed patient and no TOC needs have been identified at this time. We will continue to monitor patient advancement through interdisciplinary progression rounds. If new patient transition needs arise, please place a TOC consult.

## 2022-04-03 NOTE — Progress Notes (Signed)
EEG complete - results pending 

## 2022-04-03 NOTE — Plan of Care (Signed)
  Problem: Acute Rehab PT Goals(only PT should resolve) Goal: Pt Will Go Supine/Side To Sit Outcome: Progressing Flowsheets (Taken 04/03/2022 1410) Pt will go Supine/Side to Sit:  with min guard assist  with supervision Goal: Patient Will Transfer Sit To/From Stand Outcome: Progressing Flowsheets (Taken 04/03/2022 1410) Patient will transfer sit to/from stand: with minimal assist Goal: Pt Will Transfer Bed To Chair/Chair To Bed Outcome: Progressing Flowsheets (Taken 04/03/2022 1410) Pt will Transfer Bed to Chair/Chair to Bed: with min assist Goal: Pt Will Ambulate Outcome: Progressing Flowsheets (Taken 04/03/2022 1410) Pt will Ambulate:  75 feet  with minimal assist  with rolling walker  with least restrictive assistive device   2:11 PM, 04/03/22 Lonell Grandchild, MPT Physical Therapist with [facility] [phone] office [phone] mobile phone

## 2022-04-03 NOTE — Consult Note (Addendum)
I connected with  Adam Arellano on 04/03/22 by a video enabled telemedicine application and verified that I am speaking with the correct person using two identifiers.   I discussed the limitations of evaluation and management by telemedicine. The patient expressed understanding and agreed to proceed.  Location of patient: [facility] Location of physician: [facility]  Neurology Consultation Reason for Consult: stroke Referring Physician: Dr Carlynn Purl  CC: Altered mental status  History is obtained from: Chart review, patient  HPI: Adam Arellano is a 88 y.o. male with past medical history of transient global amnesia in 2009, hypertension, hyperlipidemia, coronary artery disease with NSTEMI status post multiple PCI's, prostate cancer status post radical prostatectomy, severe mitral regurgitation status post TEER ( 08/19/2020). Patient doesn't remember what happened yesterday.  Per daughter at bedside, she spoke with patient on Saturday 03/25/2019.  Around 4 PM when he was his usual self.  Yesterday afternoon Multifol patient initially did not pick up and when he did pick up he started confused.  Therefore daughter called EMS.  When EMS went to his house, he was laying in bed had some bruises and urinary incontinence.  He was brought to [facility], ED and admitted for further work-up.  CT head was done showed acute stroke and therefore neurology was consulted.  Of note, per review of records, patient was on aspirin and Plavix for 1 year after his mitral valve surgery and is currently on aspirin 81 mg daily.  LKN: 04/01/2022 ~ 1600 Event happened at home No tPA as outside window No thrombectomy as no large vessel occlusion mRS 0  ROS: All other systems reviewed and negative except as noted in the HPI.  Past Medical History:  Diagnosis Date   Asthma    a. Childhood.   Coronary artery disease    a. NSTEMI s/p DES to dRCA 2007. b. Angina 07/2009: s/p DESx2 to mid  RCA for ISR and prox RCA, EF 65%. c. Stress test 05/2013: low risk, no evidence of ischemia, EF 61%.   Dyslipidemia    GERD (gastroesophageal reflux disease)    History of kidney stones    Hypertension    Peripheral vascular disease (HCC)    Prostate cancer ([place])    a. s/p radical prostatectomy.   PVC's (premature ventricular contractions)    S/P mitral valve clip implantation 08/19/2020   s/p MitraClip implantation with a XTW by Dr. Burt Knack    Transient global amnesia    a. Adm 2009: imaging nonacute, had resolved.    Family History  Problem Relation Age of Onset   CAD Neg Hx     Social History:  reports that he quit smoking about 43 years ago. His smoking use included cigarettes. He has never used smokeless tobacco. He reports that he does not currently use alcohol. He reports that he does not use drugs.   Exam: Current vital signs: BP (!) 171/78   Pulse 78   Temp 98.4 F (36.9 C) (Oral)   Resp 13   Ht '5\' 7"'$  (1.702 m)   Wt 73 kg   SpO2 99%   BMI 25.21 kg/m  Vital signs in last 24 hours: Temp:  [98.4 F (36.9 C)-98.8 F (37.1 C)] 98.4 F (36.9 C) (10/30 0330) Pulse Rate:  [69-99] 78 (10/30 0700) Resp:  [13-25] 13 (10/30 0700) BP: (158-195)/(71-99) 171/78 (10/30 0700) SpO2:  [95 %-100 %] 99 % (10/30 0700) Weight:  [73 kg] 73 kg (10/29 1842)   Physical Exam  Constitutional: Appears well-developed and well-nourished.  Psych: Affect appropriate to situation Neuro: Awake, alert, oriented to place and person, time: November, did not know the year, no aphasia, right hemianopia, difficult to test for neglect via tele, PERRLA, EOMI, no apparent facial asymmetry, antigravity strength in all 4 extremities without drift (does have right upper extremity weakness due to right shoulder rotator cuff injury at baseline), FTN intact bilaterally, denies any decrease in sensation to light touch  NIHSS 2  INPUTS: 1A: Level of consciousness --> 0 = Alert; keenly responsive 1B: Ask  month and age --> 0 = Both questions right 1C: 'Blink eyes' & 'squeeze hands' --> 0 = Performs both tasks 2: Horizontal extraocular movements --> 0 = Normal 3: Visual fields --> 2 = Complete hemianopia 4: Facial palsy --> 0 = Normal symmetry 5A: Left arm motor drift --> 0 = No drift for 10 seconds 5B: Right arm motor drift --> 0 = No drift for 10 seconds 6A: Left leg motor drift --> 0 = No drift for 5 seconds 6B: Right leg motor drift --> 0 = No drift for 5 seconds 7: Limb Ataxia --> 0 = No ataxia 8: Sensation --> 0 = Normal; no sensory loss 9: Language/aphasia --> 0 = Normal; no aphasia 10: Dysarthria --> 0 = Normal 11: Extinction/inattention --> 0 = No abnormality   I have reviewed labs in epic and the results pertinent to this consultation are: CBC:  Recent Labs  Lab 04/02/22 1901 04/03/22 0615  WBC 11.5* 8.9  NEUTROABS 9.6*  --   HGB 12.7* 11.5*  HCT 38.7* 35.4*  MCV 95.8 97.3  PLT 268 740    Basic Metabolic Panel:  Lab Results  Component Value Date   NA 144 04/03/2022   K 3.8 04/03/2022   CO2 23 04/03/2022   GLUCOSE 89 04/03/2022   BUN 33 (H) 04/03/2022   CREATININE 1.42 (H) 04/03/2022   CALCIUM 8.4 (L) 04/03/2022   GFRNONAA 48 (L) 04/03/2022   GFRAA 45 (L) 07/28/2020   Lipid Panel:  Lab Results  Component Value Date   LDLCALC 69 04/03/2022   HgbA1c:  Lab Results  Component Value Date   HGBA1C  08/26/2007    5.7 (NOTE)   The ADA recommends the following therapeutic goals for glycemic   control related to Hgb A1C measurement:   Goal of Therapy:   < 7.0% Hgb A1C   Action Suggested:  > 8.0% Hgb A1C   Ref:  Diabetes Care, 22, Suppl. 1, 1999   Urine Drug Screen: No results found for: "LABOPIA", "COCAINSCRNUR", "LABBENZ", "AMPHETMU", "THCU", "LABBARB"  Alcohol Level No results found for: "ETH"   I have reviewed the images obtained:  CT head without contrast 12/30/2021: Moderate volume hyperacute to acute infarct in the left posterior cerebral artery  territory without significant associated mass effect.  MRI brain without contrast 04/03/2022: Acute left PCA territory infarct.  US carotid bilateral 04/03/2022:  Right: Heterogeneous and partially calcified plaque at the right carotid bifurcation, with discordant results regarding degree of stenosis by established duplex criteria. Peak velocity suggests 50%-69% stenosis, with the ICA/ CCA ratio suggesting a lesser degree of stenosis. If establishing a more accurate degree of stenosis is required, cerebral angiogram should be considered, or as a second best test, CTA.   Left: Color duplex indicates minimal heterogeneous and calcified plaque, with no hemodynamically significant stenosis by duplex criteria in the extracranial cerebrovascular circulation.  MRA head without contrast 04/03/2022: 1. Occlusion (favored) versus severe stenosis of  the left P2 PCA. 2. Potentially severe proximal right A2 ACA stenosis. 3. Approximately 3 mm outpouching arising from the left MCA bifurcation, concerning for aneurysm. Recommend CTA to confirm and further characterize.     ASSESSMENT/PLAN: 88 year old male who presented with new onset altered mental status.  Acute ischemic stroke -Etiology: Likely embolic  Recommendations: -Recommend ASA '81mg'$  daily and Plavix 75 mg daily for 3 months followed by aspirin 81 mg daily -Continue home  statin, LDL<70 -We will obtain EEG due to prolonged confusion and urinary incontinence -TTE ordered and pending.  If no evidence of thrombus, recommend 30-day event monitor at the time of discharge to look for paroxysmal A-fib -Ideally would like to obtain a CTA head and neck for further evaluation of L MCA aneurysm as well as aortic stenosis.  However, patient has an AKI.  Therefore will defer for now.  This can be obtained as an outpatient -Goal blood pressure: Normotension.  -PT/OT -Modification of stroke risk factors -NIHSS per stroke protocol -Stat CT head  for any change in neurological status -Follow-up with neurology in 3 months -Discussed plan with daughter at bedside and Dr. Dyann Kief by secure chat  Thank you for allowing [place] to participate in the care of this patient. If you have any further questions, please contact  me or neurohospitalist.   Zeb Comfort Epilepsy [facility] neurohospitalist

## 2022-04-03 NOTE — Procedures (Signed)
Patient Name: Adam Arellano  MRN: [id number]  Epilepsy Attending: Lora Havens  Referring Physician/Provider: Lora Havens, MD Date: 04/03/2022 Duration: 21.56 mins  Patient history: 88yo M with ams. EEG to evaluate for seizure  Level of alertness: Awake  AEDs during EEG study: GBP  Technical aspects: This EEG study was done with scalp electrodes positioned according to the 10-20 International system of electrode placement. Electrical activity was reviewed with band pass filter of 1-'70Hz'$ , sensitivity of 7 uV/mm, display speed of 96m/sec with a '60Hz'$  notched filter applied as appropriate. EEG data were recorded continuously and digitally stored.  Video monitoring was available and reviewed as appropriate.  Description: The posterior dominant rhythm consists of 9-10 Hz activity of moderate voltage (25-35 uV) seen predominantly in posterior head regions, symmetric and reactive to eye opening and eye closing. EEG showed intermittent 2-3 Hz delta slowing In left parietal region. Hyperventilation and photic stimulation were not performed.     ABNORMALITY - Intermittent slow, left parietal region  IMPRESSION: This study is suggestive of cortical dysfunction arising from left parietal region likely secondary to underlying stroke. No seizures or epileptiform discharges were seen throughout the recording.  Shaconda Hajduk OBarbra Sarks

## 2022-04-03 NOTE — Progress Notes (Signed)
Pt remains up in recliner. Has had several visitors this afternoon. Only ate desserts on supper tray, declined rest of meal. Denies any c/o pain, no acute changes noted since admission. Continues with male-wick, clear yellow urine output noted.

## 2022-04-03 NOTE — Progress Notes (Signed)
*  PRELIMINARY RESULTS* Echocardiogram 2D Echocardiogram has been performed.  Adam Arellano 04/03/2022, 1:06 PM

## 2022-04-03 NOTE — Evaluation (Signed)
Physical Therapy Evaluation Patient Details Name: Adam Arellano MRN: [id number] DOB: Nov 19, 1931 Today's Date: 04/03/2022  History of Present Illness  88 y.o. M admitted on 04/02/22 due to being found at home lying in bed covered in bruises, bleeding, and with urine incontinence. CT positive for an acute stroke in the L posterior cerbral artery territory. PMH significant for  transient global amnesia in 2009, hypertension, hyperlipidemia, coronary artery disease with NSTEMI status post multiple PCI's, prostate cancer status post radical prostatectomy, severe mitral regurgitation status post TEER ( 08/19/2020).   Clinical Impression  Patient at high risk for falls ambulating without AD due to ataxic like gait on RLE with frequent buckling of knee and scisooring legs when making turns, has to lean on nearby objects for support when not using RW, severe neglect of right side due to hemianopsia with frequent bumping in to objects with RW on right side.  Patient tolerated sitting up in chair after therapy with family members present in room.  Patient will benefit from continued skilled physical therapy in hospital and recommended venue below to increase strength, balance, endurance for safe ADLs and gait.       Recommendations for follow up therapy are one component of a multi-disciplinary discharge planning process, led by the attending physician.  Recommendations may be updated based on patient status, additional functional criteria and insurance authorization.  Follow Up Recommendations Acute inpatient rehab (3hours/day)      Assistance Recommended at Discharge Intermittent Supervision/Assistance  Patient can return home with the following  A lot of help with walking and/or transfers;A little help with bathing/dressing/bathroom;Help with stairs or ramp for entrance;Assistance with cooking/housework    Equipment Recommendations Rolling walker (2 wheels)  Recommendations for Other Services        Functional Status Assessment Patient has had a recent decline in their functional status and demonstrates the ability to make significant improvements in function in a reasonable and predictable amount of time.     Precautions / Restrictions Precautions Precautions: Fall Restrictions Weight Bearing Restrictions: No      Mobility  Bed Mobility Overal bed mobility: Needs Assistance Bed Mobility: Supine to Sit     Supine to sit: Min guard     General bed mobility comments: had difficulty pushing off on RUE due to weakness    Transfers Overall transfer level: Needs assistance Equipment used: Rolling walker (2 wheels) Transfers: Sit to/from Stand Sit to Stand: Mod assist           General transfer comment: unsteady labored movement with difficulty advancing RLE due to incoordination    Ambulation/Gait Ambulation/Gait assistance: Mod assist Gait Distance (Feet): 35 Feet Assistive device: None, Rolling walker (2 wheels) Gait Pattern/deviations: Decreased step length - right, Decreased step length - left, Decreased stance time - right, Knees buckling, Ataxic, Scissoring Gait velocity: decreased     General Gait Details: at high risk for falls ambulating without AD due to ataxic like gait on RLE with frequent buckling of knee and scisooring legs when making turns, has to lean on nearby objects for support when not using RW, severe neglect of right side due to hemianopsia  Stairs            Wheelchair Mobility    Modified Rankin (Stroke Patients Only)       Balance Overall balance assessment: Needs assistance Sitting-balance support: Feet supported, No upper extremity supported Sitting balance-Leahy Scale: Fair Sitting balance - Comments: fair/good seated at EOB   Standing balance support: During  functional activity, No upper extremity supported Standing balance-Leahy Scale: Poor Standing balance comment: fair using RW                              Pertinent Vitals/Pain Pain Assessment Pain Assessment: No/denies pain    Home Living Family/patient expects to be discharged to:: Private residence Living Arrangements: Alone Available Help at Discharge: Family Type of Home: House Home Access: Level entry     Alternate Level Stairs-Number of Steps: 15 steps to basement Home Layout: Laundry or work area in basement;Two level;Able to live on main level with bedroom/bathroom Home Equipment: Rolling Walker (2 wheels);Shower seat - built in Additional Comments: Per daughter, he will be living with her and her husband once discharged    Prior Function Prior Level of Function : Independent/Modified Independent;Working/employed;Driving             Mobility Comments: Hydrographic surveyor without AD, drives, works ADLs Comments: Completely indep with all ADL's and IADL's     Hand Dominance   Dominant Hand: Right    Extremity/Trunk Assessment   Upper Extremity Assessment Upper Extremity Assessment: Defer to OT evaluation RUE Deficits / Details: Ataxic movements, globally weak, decreased ROM due to past rotator cuff injury RUE Sensation: decreased light touch;decreased proprioception RUE Coordination: decreased fine motor;decreased gross motor    Lower Extremity Assessment Lower Extremity Assessment: RLE deficits/detail RLE Deficits / Details: grossly 4/5 except hamstrings 3+/5, ankle dorsiflexion 3+5 RLE Sensation: decreased light touch;decreased proprioception RLE Coordination: decreased gross motor    Cervical / Trunk Assessment Cervical / Trunk Assessment: Kyphotic  Communication   Communication: No difficulties  Cognition Arousal/Alertness: Awake/alert Behavior During Therapy: WFL for tasks assessed/performed, Impulsive Overall Cognitive Status: Within Functional Limits for tasks assessed                                          General Comments General comments (skin integrity, edema, etc.):  VSS on RA, daughter and husband present    Exercises     Assessment/Plan    PT Assessment Patient needs continued PT services  PT Problem List Decreased strength;Decreased activity tolerance;Decreased balance;Decreased mobility;Decreased coordination       PT Treatment Interventions DME instruction;Gait training;Stair training;Functional mobility training;Therapeutic activities;Therapeutic exercise;Patient/family education;Neuromuscular re-education;Balance training    PT Goals (Current goals can be found in the Care Plan section)  Acute Rehab PT Goals Patient Stated Goal: return home after rehab PT Goal Formulation: With patient/family Time For Goal Achievement: 04/17/22 Potential to Achieve Goals: Good    Frequency Min 4X/week     Co-evaluation PT/OT/SLP Co-Evaluation/Treatment: Yes Reason for Co-Treatment: Complexity of the patient's impairments (multi-system involvement);To address functional/ADL transfers PT goals addressed during session: Mobility/safety with mobility;Balance;Proper use of DME         AM-PAC PT "6 Clicks" Mobility  Outcome Measure Help needed turning from your back to your side while in a flat bed without using bedrails?: A Little Help needed moving from lying on your back to sitting on the side of a flat bed without using bedrails?: A Little Help needed moving to and from a bed to a chair (including a wheelchair)?: A Lot Help needed standing up from a chair using your arms (e.g., wheelchair or bedside chair)?: A Little Help needed to walk in hospital room?: A Lot Help needed climbing 3-5 steps with  a railing? : A Lot 6 Click Score: 15    End of Session Equipment Utilized During Treatment: Gait belt Activity Tolerance: Patient tolerated treatment well;Patient limited by fatigue Patient left: in chair;with call bell/phone within reach;with chair alarm set;with family/visitor present Nurse Communication: Mobility status PT Visit Diagnosis:  Unsteadiness on feet (R26.81);Other abnormalities of gait and mobility (R26.89);Muscle weakness (generalized) (M62.81)    Time: 7793-9030 PT Time Calculation (min) (ACUTE ONLY): 30 min   Charges:   PT Evaluation $PT Eval Moderate Complexity: 1 Mod PT Treatments $Therapeutic Activity: 23-37 mins        2:09 PM, 04/03/22 Lonell Grandchild, MPT Physical Therapist with [facility] [phone] office [phone] mobile phone

## 2022-04-03 NOTE — Progress Notes (Signed)
Dr. Hortense Ramal completing pt eval via tele neuro camera, daughter at bedside.

## 2022-04-03 NOTE — Progress Notes (Signed)
Pt arrived to 300 unit from MRI via stretcher. Pt able to ambulate with one assist for stability from stretcher to bed, has shuffling gait.  Pt alert, follows commands appropriately. Pt with noticeable weakness in right upper extremity, tends to pull right hand and arm towards chest and has difficulty stretching right arm out when asked. Pt noted to have incontinent BM, pt cleaned and linens changed. Male wick in place for urinary incontinence. PT and OT staff in to work with pt at this time. Daughter and son-in-law at bed side for assistance in completing admission information.

## 2022-04-03 NOTE — Progress Notes (Signed)
Pt up to recliner after nap to eat lunch. No significant changes noted. Pt feeding self using fingers at this time, also trying to use fork with left hand (is right hand dominant) with moderate spillage. Was able to convince pt to allow me to assist with feeding and pt ate 75% of meal. No swallowing difficulty noted. Chair alarm on for safety.

## 2022-04-04 ENCOUNTER — Other Ambulatory Visit: Payer: Self-pay

## 2022-04-04 DIAGNOSIS — I6312 Cerebral infarction due to embolism of basilar artery: Secondary | ICD-10-CM | POA: Diagnosis not present

## 2022-04-04 DIAGNOSIS — N1832 Chronic kidney disease, stage 3b: Secondary | ICD-10-CM | POA: Diagnosis not present

## 2022-04-04 DIAGNOSIS — I10 Essential (primary) hypertension: Secondary | ICD-10-CM | POA: Diagnosis not present

## 2022-04-04 DIAGNOSIS — W19XXXA Unspecified fall, initial encounter: Secondary | ICD-10-CM | POA: Diagnosis not present

## 2022-04-04 LAB — URINE CULTURE: Culture: NO GROWTH

## 2022-04-04 NOTE — Progress Notes (Signed)
Pt complaining of burning after urination. Peri care completed & male wick changed. No redness or irritation noted of penis or urinary meatus. Male wick has been in use since admission, pt has tolerated well. Urine is cloudy but no odor. MD Dyann Kief notified of pt complaint.

## 2022-04-04 NOTE — Progress Notes (Signed)
Inpatient Rehab Admissions Coordinator:   I talked to pt and daughter regarding CIR admit via phone. They are interested and wife and daughter can provide 24/7 physical support and supervision at d/c. I will follow for potential admit pending insurance auth.  Clemens Catholic, [place], [place] Admissions Coordinator  [phone] ([place]) [phone] (office)

## 2022-04-04 NOTE — Progress Notes (Signed)
Pt assisted up to recliner this am, minimal assist to stand but pt is unsteady on feet, short shuffling steps and needs walker for steadying. Pt denies any c/o pain. Daily cares provided, pt able to wash own face and use toothbrush to clean mouth and tongue. Pt alert and oriented to person and place but is unable to tell me the year or why he is in the hospital. Advised pt that he had a stroke 2 days ago. Pt became emotional, crying and asked if the stroke was why he couldn't see out of his right eye and why his right arm and hand "won't work right." Confirmed to pt that the loss of vision, arm weakness and difficulty remembering are side effects of the stroke. Pt repeated my statement, then started crying again. Offered reassurance to pt that he has had improvement since yesterday and that the plan is for rehab therapy to increase his function. Pt states, "Well, I won't ever get better, I'm too old." Again, reminded pt that he is showing some improvement but that while he may have some permanent loss of function, his daughter and family are fully supportive of him and his, "new normal". He stated, "They are so good to me."  Pt currently eating breakfast, using fingers to feed himself as he was unable to successfully maneuver fork with left hand and right hand is weak. No difficulty with swallowing noted.

## 2022-04-04 NOTE — Progress Notes (Signed)
Patient received in chair, transferred from chair to bed. Patient rested quietly throughout the night, stroke assessments performed. Patient currently resting quietly in bed with call bell in reach.

## 2022-04-04 NOTE — Progress Notes (Signed)
Occupational Therapy Treatment Patient Details Name: Adam Arellano MRN: [id number] DOB: Dec 26, 1931 Today's Date: 04/04/2022   History of present illness 88 y.o. M admitted on 04/02/22 due to being found at home lying in bed covered in bruises, bleeding, and with urine incontinence. CT positive for an acute stroke in the L posterior cerbral artery territory. PMH significant for  transient global amnesia in 2009, hypertension, hyperlipidemia, coronary artery disease with NSTEMI status post multiple PCI's, prostate cancer status post radical prostatectomy, severe mitral regurgitation status post TEER ( 08/19/2020).   OT comments  Pt agreeable to OT treatment. Pt seated in recliner at start of session. Pt noted to be able to don and doff socks with supervision assist in the form of adjusting strategy for donning R sock. Pt able to comb his hair with supervision and noted use of L UE due to R UE lack of active shoulder range of motion. Pt needing moderate assist for ambulation due to pushing RW to far ahead of himself with physical assist to pull RW back towards his body. Pt is unsteady leaning side to side at times. Pt is able to complete standing grooming tasks with RW available and Min G to min A for balance. Verbal cuing is also needed for tasks like washing his face. Pt noted to have full AA/ROM for shoulder flexion using grabber tool as means of active assisted tool. Family and pt educated to try flexion and protraction AA/ROM as modeled. Pt was left in the chair with call bell and chair alarm set. Family also present for duration of session. Pt will benefit from continued OT in the hospital and recommended venue below to increase strength, balance, and endurance for safe ADL's.      Recommendations for follow up therapy are one component of a multi-disciplinary discharge planning process, led by the attending physician.  Recommendations may be updated based on patient status, additional functional  criteria and insurance authorization.    Follow Up Recommendations  Acute inpatient rehab (3hours/day)    Assistance Recommended at Discharge Frequent or constant Supervision/Assistance  Patient can return home with the following  A lot of help with walking and/or transfers;A lot of help with bathing/dressing/bathroom;Assistance with cooking/housework;Direct supervision/assist for medications management;Assistance with feeding;Direct supervision/assist for financial management;Assist for transportation;Help with stairs or ramp for entrance   Equipment Recommendations  BSC/3in1    Recommendations for Other Services Rehab consult    Precautions / Restrictions Precautions Precautions: Fall Restrictions Weight Bearing Restrictions: No       Mobility            General bed mobility comments: Pt seated in chair at start of session.    Transfers Overall transfer level: Needs assistance Equipment used: Rolling walker (2 wheels) Transfers: Sit to/from Stand, Bed to chair/wheelchair/BSC Sit to Stand: Min guard, Min assist     Step pivot transfers: Mod assist     General transfer comment: Pt able to boost from chair with min G assist to min A. Most assist for keeping RW steady but pt boosting from chair well. When stepping for the transfer pt was unsteady needing assist for balance and managmeent of RW.     Balance Overall balance assessment: Needs assistance Sitting-balance support: Feet supported, Bilateral upper extremity supported Sitting balance-Leahy Scale: Good Sitting balance - Comments: seated in recliner   Standing balance support: Bilateral upper extremity supported, During functional activity, Reliant on assistive device for balance Standing balance-Leahy Scale: Poor Standing balance comment: poor to fair  using RW                           ADL either performed or assessed with clinical judgement   ADL Overall ADL's : Needs assistance/impaired      Grooming: Minimal assistance;Standing;Wash/dry face;Applying deodorant;Sitting;Min guard Grooming Details (indicate cue type and reason): Pt able to attempt deodorant application while seated in the recliner but needs assist under R arm due to wakness and range of motions deficits. Pt able to wash his face at the sink with verbal cuing and min G to min A for standing balance with RW available.  Pt also able to comb his hair seated in the recliner with supervision and use of L UE when his R UE would not lift high enough.            Lower Body Dressing: Supervision/safety;Sitting/lateral leans Lower Body Dressing Details (indicate cue type and reason): Pt able to dof and don socks with verbal cuing for technique while seated in recliner. Pt struggled with R LE but was able to complete with adjustment of attempts by crossing R LE over his L LE to assist with ability to reach his R foot. Toilet Transfer: Moderate assistance;Ambulation;Rolling walker (2 wheels);BSC/3in1 Toilet Transfer Details (indicate cue type and reason): Pt able to ambulate to toilet with BSC placed over the top of the toilet seat. Moderate assitance needed for the transfer to the toilet with pt noted to struggle to lift from toilet initially without use of grab bar.         Functional mobility during ADLs: Moderate assistance;Rolling walker (2 wheels) General ADL Comments: Pt able to ambulate within room to sink and back and to toilet and back.      Cognition Arousal/Alertness: Awake/alert Behavior During Therapy: WFL for tasks assessed/performed, Impulsive Overall Cognitive Status: Within Functional Limits for tasks assessed                                 General Comments: Verbal and tactile cuing needed to follow direction safety during transfers. Pt's family reported that at baseline he does things very quickly or impulsively.        Exercises Exercises: General Upper Extremity General Exercises -  Upper Extremity Shoulder Flexion: AAROM, 10 reps, Both, Seated                 Pertinent Vitals/ Pain       Pain Assessment Pain Assessment: No/denies pain                                                          Frequency  Min 2X/week        Progress Toward Goals  OT Goals(current goals can now be found in the care plan section)  Progress towards OT goals: Progressing toward goals  Acute Rehab OT Goals Patient Stated Goal: To get his independence back OT Goal Formulation: With patient/family Time For Goal Achievement: 04/17/22 Potential to Achieve Goals: Good ADL Goals Pt Will Perform Grooming: with set-up;sitting Pt Will Perform Lower Body Bathing: with min assist;sitting/lateral leans;sit to/from stand Pt Will Perform Lower Body Dressing: with min assist;sitting/lateral leans;sit to/from stand Pt Will Transfer to Toilet: with min assist;ambulating  Pt Will Perform Toileting - Clothing Manipulation and hygiene: with min assist;sit to/from stand;sitting/lateral leans  Plan Discharge plan remains appropriate                                    End of Session Equipment Utilized During Treatment: Gait belt;Rolling walker (2 wheels)  OT Visit Diagnosis: Unsteadiness on feet (R26.81);Other abnormalities of gait and mobility (R26.89);Muscle weakness (generalized) (M62.81);Hemiplegia and hemiparesis Hemiplegia - Right/Left: Right Hemiplegia - dominant/non-dominant: Dominant Hemiplegia - caused by: Cerebral infarction   Activity Tolerance Patient tolerated treatment well   Patient Left in chair;with call bell/phone within reach;with chair alarm set;with family/visitor present   Nurse Communication          Time: 8527-7824 OT Time Calculation (min): 28 min  Charges: OT General Charges $OT Visit: 1 Visit OT Treatments $Self Care/Home Management : 8-22 mins $Therapeutic Exercise: 8-22 mins  Joci Dress OT,  MOT  Larey Seat 04/04/2022, 2:36 PM

## 2022-04-04 NOTE — PMR Pre-admission (Signed)
PMR Admission Coordinator Pre-Admission Assessment  Patient: Adam Arellano is an 88 y.o., male MRN: [id number] DOB: 03-08-1932 Height: _0  (175.3 cm) Weight: 68.4 kg  Insurance Information HMO:  yes   PPO:      PCP:      IPA:      80/20:   OTHER:  PRIMARY: UHC Medicare       Policy#: [id number]      Subscriber: [name]      Phone#: TBD     Fax#: Fax: [phone] Pre-Cert#: [id number] Received insurance approval from [place] on 04/06/22. Pt approved for 14 days beginning 11/2-11/15. Updates due 04/19/22.     Employer:  Benefits:  Phone #:      Name:  Fax: [phone] Eff Date: 06/05/2021 - still active Deductible: $0 (does not have deductible) OOP Max: $3,600 ($337.01 met) CIR: $295/day co-pay for days 1-5, $0/day co-pay for days 6+ SNF: $0.00 Copayment per day for days 1-20; $196.00 Copayment per day for days 21-39; $0.00 Copayment per day for days 40-100 for Medicare-covered care/maximum 100 days/benefit period Outpatient: $20/visit co-pay/visit Home Health:  100% coverage; limited by medical necessity DME: 80% coverage; 20% co-insurance Providers: in network   SECONDARY:       Policy#:      Phone#:   Development worker, community:       Phone#:   The Engineer, petroleum" for patients in Inpatient Rehabilitation Facilities with attached "Privacy Act [place] Records" was provided and verbally reviewed with: Family  Emergency Contact Information Contact Information     Name Relation Home Work [place] Daughter [phone]         Current Medical History  Patient Admitting Diagnosis: CVA History of Present Illness: Pt. Is an 88 y.o. male with PMH significant for  transient global amnesia in 2009, hypertension, hyperlipidemia, coronary artery disease with NSTEMI status post multiple PCI's, prostate cancer status post radical prostatectomy, severe mitral regurgitation status post TEER ( 08/19/2020) who presented to [facility] ED on  04/02/22 due to being found at home lying in bed covered in bruises, bleeding, and with urine incontinence.. CT positive for an acute stroke in the L posterior cerbral artery territory.  On admission, temp 98.4-98.8, heart rate 73-99, respiratory rate 14-25, blood pressure 162/71-195/98, satting 98-100% , 11.5, hemoglobin 12.7, platelets 268. Pt. was Hypokalemic at 3.2, creatinine at baseline, UA unremarkable for UTI. CT of the C-spine and head showed moderate volume hyperacute to acute infarct in left posterior cerebral artery territory without significant mass effect.  No facial fractures.  No acute osseous injury of the C-spine.Chest x-ray showed emphysema and cardiomegaly without acute cardiopulmonary disease. Pelvis shows no acute fracture or dislocation. Patient was given half a liter bolus and  Neurology was consulted They recommended starting Plavix and doing work-up at [facility]. Plavix 300 started, patient was already on aspirin. EEG demonstrating cortical dysfunction due to underlying stroke without epileptic waves abnormalities. Pt. Seen by PT/OT who recommended CIR to assist return to PLOF.   Complete NIHSS TOTAL: 8  Patient's medical record from [facility]  has been reviewed by the rehabilitation admission coordinator and physician.  Past Medical History  Past Medical History:  Diagnosis Date   Asthma    a. Childhood.   Coronary artery disease    a. NSTEMI s/p DES to dRCA 2007. b. Angina 07/2009: s/p DESx2 to mid RCA for ISR and prox RCA, EF 65%. c. Stress test 05/2013: low risk,  no evidence of ischemia, EF 61%.   Dyslipidemia    GERD (gastroesophageal reflux disease)    History of kidney stones    Hypertension    Peripheral vascular disease (HCC)    Prostate cancer ([place])    a. s/p radical prostatectomy.   PVC's (premature ventricular contractions)    S/P mitral valve clip implantation 08/19/2020   s/p MitraClip implantation with a XTW by Dr. Burt Knack    Transient global  amnesia    a. Adm 2009: imaging nonacute, had resolved.    Has the patient had major surgery during 100 days prior to admission? No  Family History   family history is not on file.  Current Medications  Current Facility-Administered Medications:    acetaminophen (TYLENOL) tablet 650 mg, 650 mg, Oral, Q4H PRN **OR** acetaminophen (TYLENOL) 160 MG/5ML solution 650 mg, 650 mg, Per Tube, Q4H PRN **OR** acetaminophen (TYLENOL) suppository 650 mg, 650 mg, Rectal, Q4H PRN, Zierle-Ghosh, Asia B, DO   aspirin EC tablet 81 mg, 81 mg, Oral, Daily, Zierle-Ghosh, Asia B, DO, 81 mg at 04/04/22 1005   clopidogrel (PLAVIX) tablet 75 mg, 75 mg, Oral, Daily, Zierle-Ghosh, Asia B, DO, 75 mg at 04/04/22 1005   gabapentin (NEURONTIN) capsule 100 mg, 100 mg, Oral, QHS, Zierle-Ghosh, Asia B, DO, 100 mg at 04/03/22 2106   heparin injection 5,000 Units, 5,000 Units, Subcutaneous, Q8H, Zierle-Ghosh, Asia B, DO, 5,000 Units at 04/04/22 0541   oxyCODONE (Oxy IR/ROXICODONE) immediate release tablet 5 mg, 5 mg, Oral, Q4H PRN, Zierle-Ghosh, Asia B, DO   senna-docusate (Senokot-S) tablet 1 tablet, 1 tablet, Oral, QHS PRN, Zierle-Ghosh, Asia B, DO  Patients Current Diet:  Diet Order             Diet Heart Room service appropriate? Yes; Fluid consistency: Thin  Diet effective now                   Precautions / Restrictions Precautions Precautions: Fall Restrictions Weight Bearing Restrictions: No   Has the patient had 2 or more falls or a fall with injury in the past year? No  Prior Activity Level Community (5-7x/wk): Pt. active in the community PTA  Prior Functional Level Self Care: Did the patient need help bathing, dressing, using the toilet or eating? Independent  Indoor Mobility: Did the patient need assistance with walking from room to room (with or without device)? Independent  Stairs: Did the patient need assistance with internal or external stairs (with or without device)?  Independent  Functional Cognition: Did the patient need help planning regular tasks such as shopping or remembering to take medications? Independent  Patient Information Are you of Hispanic, Latino/a,or Spanish origin?: A. No, not of Hispanic, Latino/a, or Spanish origin What is your race?: A. White Do you need or want an interpreter to communicate with a doctor or health care staff?: 0. No  Patient's Response To:  Health Literacy and Transportation Is the patient able to respond to health literacy and transportation needs?: Yes Health Literacy - How often do you need to have someone help you when you read instructions, pamphlets, or other written material from your doctor or pharmacy?: Never In the past 12 months, has lack of transportation kept you from medical appointments or from getting medications?: No In the past 12 months, has lack of transportation kept you from meetings, work, or from getting things needed for daily living?: No  Development worker, international aid / [place] Devices/Equipment: None Home Equipment: Conservation officer, nature (2 wheels),  Shower seat - built in  Prior Device Use: Indicate devices/aids used by the patient prior to current illness, exacerbation or injury? None of the above  Current Functional Level Cognition  Overall Cognitive Status: Within Functional Limits for tasks assessed Current Attention Level: Selective Orientation Level: Oriented to person, Oriented to place, Disoriented to time, Disoriented to situation Following Commands: Follows one step commands consistently, Follows one step commands inconsistently Safety/Judgement: Decreased awareness of safety, Decreased awareness of deficits General Comments: Verbal and tactile cuing needed to follow direction safety during transfers. Pt's family reported that at baseline he does things very quickly or impulsively. Attention: Focused Memory: Impaired Memory Impairment: Decreased short term  memory Decreased Short Term Memory: Verbal basic Awareness: Impaired Awareness Impairment: Anticipatory impairment Executive Function: Reasoning, Organizing Reasoning: Impaired Reasoning Impairment: Verbal basic Organizing: Impaired Organizing Impairment: Verbal basic    Extremity Assessment (includes Sensation/Coordination)  Upper Extremity Assessment: Defer to OT evaluation RUE Deficits / Details: Ataxic movements, globally weak, decreased ROM due to past rotator cuff injury RUE Sensation: decreased light touch, decreased proprioception RUE Coordination: decreased fine motor, decreased gross motor  Lower Extremity Assessment: RLE deficits/detail RLE Deficits / Details: grossly 4/5 except hamstrings 3+/5, ankle dorsiflexion 3+5 RLE Sensation: decreased light touch, decreased proprioception RLE Coordination: decreased gross motor    ADLs  Overall ADL's : Needs assistance/impaired Eating/Feeding: Minimal assistance, Sitting Grooming: Minimal assistance, Standing, Wash/dry face, Applying deodorant, Sitting, Min guard Grooming Details (indicate cue type and reason): Pt able to attempt deodorant application while seated in the recliner but needs assist under R arm due to wakness and range of motions deficits. Pt able to wash his face at the sink with verbal cuing and min G to min A for standing balance with RW available. Upper Body Bathing: Moderate assistance, Sitting Lower Body Bathing: Maximal assistance, Sit to/from stand, Sitting/lateral leans Upper Body Dressing : Moderate assistance, Sitting Lower Body Dressing: Supervision/safety, Sitting/lateral leans Lower Body Dressing Details (indicate cue type and reason): Pt able to dof and don socks with verbal cuing for technique while seated in recliner. Pt struggled with R LE but was able to complete with adjustment of attempts by crossing R LE over his L LE to assist with ability to reach his R foot. Toilet Transfer: Moderate assistance,  Ambulation, Rolling walker (2 wheels), BSC/3in1 Toilet Transfer Details (indicate cue type and reason): Pt able to ambulate to toilet with BSC placed over the top of the toilet seat. Moderate assitance needed for the transfer to the toilet with pt noted to struggle to lift from toilet initially without use of grab bar. Toileting- Clothing Manipulation and Hygiene: Maximal assistance, +2 for safety/equipment, Sitting/lateral lean, Sit to/from stand Functional mobility during ADLs: Moderate assistance, Rolling walker (2 wheels) General ADL Comments: Pt able to ambulate within room to sink and back and to toilet and back.    Mobility  Overal bed mobility: Needs Assistance Bed Mobility: Supine to Sit Supine to sit: Min guard General bed mobility comments: Pt seated in chair at start of session.    Transfers  Overall transfer level: Needs assistance Equipment used: Rolling walker (2 wheels) Transfers: Sit to/from Stand, Bed to chair/wheelchair/BSC Sit to Stand: Min guard, Min assist Bed to/from chair/wheelchair/BSC transfer type:: Step pivot Step pivot transfers: Mod assist General transfer comment: Pt able to boost from chair with min G assist to min A. Most assist for keeping RW steady but pt boosting from chair well. When stepping for the transfer pt was unsteady needing  assist for balance and managmeent of RW.    Ambulation / Gait / Stairs / Wheelchair Mobility  Ambulation/Gait Ambulation/Gait assistance: Mod assist Gait Distance (Feet): 35 Feet Assistive device: None, Rolling walker (2 wheels) Gait Pattern/deviations: Decreased step length - right, Decreased step length - left, Decreased stance time - right, Knees buckling, Ataxic, Scissoring General Gait Details: at high risk for falls ambulating without AD due to ataxic like gait on RLE with frequent buckling of knee and scisooring legs when making turns, has to lean on nearby objects for support when not using RW, severe neglect of  right side due to hemianopsia Gait velocity: decreased    Posture / Balance Dynamic Sitting Balance Sitting balance - Comments: seated in recliner Balance Overall balance assessment: Needs assistance Sitting-balance support: Feet supported, Bilateral upper extremity supported Sitting balance-Leahy Scale: Good Sitting balance - Comments: seated in recliner Standing balance support: Bilateral upper extremity supported, During functional activity, Reliant on assistive device for balance Standing balance-Leahy Scale: Poor Standing balance comment: poor to fair using RW    Special needs/care consideration Special service needs none   Previous Home Environment (from acute therapy documentation) Living Arrangements: Children  Lives With: Alone Available Help at Discharge: Family Type of Home: House Home Layout: Laundry or work area in basement, Two level, Able to live on main level with bedroom/bathroom Alternate Level Stairs-Rails: Right, Left, Can reach both Alternate Level Stairs-Number of Steps: 15 steps to basement Home Access: Level entry Bathroom Shower/Tub: Gaffer, Chiropodist: Programmer, systems: Yes Home Care Services: No Additional Comments: Per daughter, he will be living with her and her husband once discharged  Discharge Living Setting Plans for Discharge Living Setting: Patient's home Type of Home at Discharge: House Discharge Home Layout: One level Discharge Home Access: Level entry Discharge Bathroom Shower/Tub: Walk-in shower, Tub/shower unit Discharge Bathroom Toilet: Standard Discharge Bathroom Accessibility: Yes How Accessible: Accessible via walker, Accessible via wheelchair Does the patient have any problems obtaining your medications?: No  Social/Family/Support Systems Patient Roles: Other (Comment) Contact Information: [phone] Anticipated Caregiver: Campbell Stall Anticipated Caregiver's Contact  Information: [phone] Ability/Limitations of Caregiver: Min A Caregiver Availability: 24/7 Discharge Plan Discussed with Primary Caregiver: Yes Is Caregiver In Agreement with Plan?: Yes Does Caregiver/Family have Issues with Lodging/Transportation while Pt is in Rehab?: Yes  Goals Patient/Family Goal for Rehab: PT/OT/SLP Supervision Expected length of stay: 14-16 days Pt/Family Agrees to Admission and willing to participate: Yes Program Orientation Provided & Reviewed with Pt/Caregiver Including Roles  & Responsibilities: Yes  Decrease burden of Care through IP rehab admission: n/a  Possible need for SNF placement upon discharge: no   Patient Condition: I have reviewed medical records from [facility] , spoken with CM, and patient and daughter. I discussed via phone for inpatient rehabilitation assessment.  Patient will benefit from ongoing PT, OT, and SLP, can actively participate in 3 hours of therapy a day 5 days of the week, and can make measurable gains during the admission.  Patient will also benefit from the coordinated team approach during an Inpatient Acute Rehabilitation admission.  The patient will receive intensive therapy as well as Rehabilitation physician, nursing, social worker, and care management interventions.  Due to safety, skin/wound care, disease management, medication administration, pain management, and patient education the patient requires 24 hour a day rehabilitation nursing.  The patient is currently Min G-Mod A with mobility and  basic ADLs.  Discharge setting and therapy post discharge at home with home health  is anticipated.  Patient has agreed to participate in the Acute Inpatient Rehabilitation Program and will admit today.  Preadmission Screen Completed By:  Genella Mech, updates by Gayland Curry 04/04/2022 3:01 PM ______________________________________________________________________   Discussed status with Dr. Naaman Plummer on 04/07/22  at  10:06 AM and received approval for admission today.  Admission Coordinator:  Genella Mech, CCC-SLP, updates by Gayland Curry, MS, CCC-SLP time 10:06 AM/Date 04/07/22    Assessment/Plan: Diagnosis: Left PCA infarct Does the need for close, 24 hr/day Medical supervision in concert with the patient's rehab needs make it unreasonable for this patient to be served in a less intensive setting? Yes Co-Morbidities requiring supervision/potential complications: CAD, HTN, MR, prostate cancer Due to bladder management, bowel management, safety, skin/wound care, disease management, medication administration, pain management, and patient education, does the patient require 24 hr/day rehab nursing? Yes Does the patient require coordinated care of a physician, rehab nurse, PT, OT, and SLP to address physical and functional deficits in the context of the above medical diagnosis(es)? Yes Addressing deficits in the following areas: balance, endurance, locomotion, strength, transferring, bowel/bladder control, bathing, dressing, feeding, grooming, toileting, cognition, speech, swallowing, and psychosocial support Can the patient actively participate in an intensive therapy program of at least 3 hrs of therapy 5 days a week? Yes The potential for patient to make measurable gains while on inpatient rehab is excellent Anticipated functional outcomes upon discharge from inpatient rehab: supervision PT, supervision OT, supervision SLP Estimated rehab length of stay to reach the above functional goals is: 14-16 Anticipated discharge destination: Home 10. Overall Rehab/Functional Prognosis: excellent   MD Signature: Meredith Staggers, MD, [place] Director Rehabilitation Services 04/07/2022

## 2022-04-04 NOTE — Progress Notes (Signed)
Progress Note   Patient: Adam Arellano KPT:[id number] DOB: 04/10/1932 DOA: 04/02/2022     1 DOS: the patient was seen and examined on 04/04/2022   Brief hospital course: As per H&P written by Dr. Clearence Ped on 04/02/22 Adam Arellano is a 88 y.o. male with medical history significant of history of coronary artery disease, hyperlipidemia, GERD, hypertension, status post mitral valve clip implant, and more presents to the ED with a chief complaint of altered mental status.  Is reported the daughter spoke to patient last yesterday around 4 PM and he was normal at that time.  Patient reports that he was normal at the time that he went to bed.  Reports he was also normal when he got up this morning.  He reports that he remembers eating breakfast and felt that he was in his normal state of health at that time as well.,  And then daughter was trying to get a hold of him later in the day and could not reach him.  She sent somebody to check on him and he was found in his bed saturated in urine, with bruises and blood around, everything knocked off the nightstand, and with slurred speech.  Patient reports he does not remember that.  He does not remember falling.  Reports he is not in any pain right now.  He does not remember having a headache, dizziness, chest pain, palpitations, dyspnea.  Daughter reports at baseline he has clear speech, lives independently, takes care of his own ADLs.  He has no history of tremor, although there is a right upper extremity tremor at this time.  He has no history of contracture although he is holding his right upper extremity in a fist, unless he really focuses on opening his hand.  Patient is no longer slurred, but it is reported that it was slurred when he was found.  He also had incoherent speech where the words were not making sense when he was found.  Patient is not able to contribute any more to this history.  Of note, he does have a mitral valve clip and compatibility  with MRI is unknown.   Patient does not smoke, does not drink.  He is vaccinated for COVID.  Patient is full code.  Assessment and Plan: * CVA (cerebral vascular accident) ([place]) - Right upper extremity weakness, reported dysarthria when he was found and is experiencing right hemianopsia. -Patient has past swallowing test -LDL 69, A1c 5.5 -MRI demonstrating left PCA infarct -Following recommendations by neurology service will continue aspirin and Plavix for 60-monthwith the use of subsequent aspirin for secondary prevention. -Physical therapy recommending CIR; if not a candidate, family will prefer discharge home with home health services. -No thrombi or significant abnormalities appreciated on 2D echo; EEG demonstrating cortical dysfunction due to underlying stroke without epileptic waves abnormalities. -Allowing for permissive hypertension; with slow resumption of antihypertensive agents in the next 24 hours. -Patient will also need 30 days event monitoring at discharge (cardiology has been made aware). -Outpatient follow-up with neurology service in 383-monthhas been recommended.  Fall at home, initial encounter - Likely related to CVA - No fractures appreciated -Physical therapy has seen patient with recommendation for CIR; currently pending final decision and determination. -Continue supportive care and assist -Continue to maintain adequate hydration.  Hypokalemia - Repleted and within normal limits currently -Continue to follow electrolytes trend intermittently.  Chronic kidney disease, stage 3b (HCC) -Creatinine 1.61 at baseline - Renal function has remained stable. -Continue  to maintain adequate hydration -Follow renal function trend intermittently.  Essential hypertension - Continue to follow-up vital signs -Blood pressure overall is stable while allowing permissive hypertension -Slowly resume back antihypertensive agents -Heart healthy diet discussed with  patient.  Grade 1 diastolic dysfunction -Chronic and compensated -Continue heart healthy diet -Follow volume status.   Subjective:  No fever, no chest pain, no nausea, no vomiting.  Patient denies palpitation.  No new focal deficit.  Physical Exam: Vitals:   04/03/22 1400 04/03/22 1822 04/03/22 2000 04/04/22 0436  BP: (!) 184/74 (!) 173/102 (!) 173/101 (!) 157/73  Pulse: 84 95 76 66  Resp: '18 16 16 14  '$ Temp: 98.3 F (36.8 C)  98.7 F (37.1 C) (!) 97.3 F (36.3 C)  TempSrc: Oral  Oral   SpO2: 98% 100% 98% 99%  Weight:      Height:      General exam: Alert, awake, oriented x2 and following commands appropriately; no chest pain, no nausea, no vomiting, no abdominal pain. Respiratory system: Clear to auscultation. Respiratory effort normal.  Good saturation on room air. Cardiovascular system:RRR. No rubs or gallops.  No JVD. Gastrointestinal system: Abdomen is nondistended, soft and nontender. No organomegaly or masses felt. Normal bowel sounds heard. Central nervous system: No new focal deficit appreciated; continue to demonstrate right-sided weakness (upper extremity more than lower extremity), positive right hemianopsia still present.  Family also reported him to be more forgetful. Extremities: No cyanosis or clubbing. Skin: No petechiae. Psychiatry: Flat affect appreciated on exam.  Data Reviewed: Lipid panel total cholesterol 123, HDL 46, LDL 69 and triglycerides 38 A1C: 5.5 Comprehensive metabolic panel: Sodium 937, potassium 3.8, chloride 112, BUN 33, creatinine 1.42; normal LFTs.  GFR 48 CBC: WBC 7.9, hemoglobin 11.5 and platelet count 233 K.  MRI: Acute left PCA territory infarct; MRI occlusion (favored) versus severe stenosis of the left P2 PCA.  Approximately 3 mm outpouching arising from the left MCA bifurcation, concerning for aneurysm.   Carotid ultrasound demonstrating right-sided 50 to 69% stenosis in his ICA; no significant stenosis or plaque formation  appreciated on his left carotids.   EEG: No epileptic wave abnormalities appreciated; cortical dysfunction arising from the left parietal region likely secondary to underlying stroke.  2D echo:  1. Left ventricular ejection fraction, by estimation, is 55 to 60%. The  left ventricle has normal function. The left ventricle has no regional  wall motion abnormalities. There is mild left ventricular hypertrophy.  Left ventricular diastolic parameters  are consistent with Grade I diastolic dysfunction (impaired relaxation).   2. Right ventricular systolic function is normal. The right ventricular  size is normal. There is mildly elevated pulmonary artery systolic  pressure. The estimated right ventricular systolic pressure is 16.9 mmHg.   3. Left atrial size was mildly dilated.   4. The mitral valve has been repaired/replaced. Mild mitral valve  regurgitation. The mean mitral valve gradient is 3.0 mmHg. There is a  Mitra-Clip present in the mitral position.   5. Tricuspid valve regurgitation is mild to moderate.   6. The aortic valve is tricuspid. Aortic valve regurgitation is not  visualized. Aortic valve sclerosis/calcification is present, without any  evidence of aortic stenosis. Aortic valve mean gradient measures 5.5 mmHg.   7. The inferior vena cava is normal in size with greater than 50%  respiratory variability, suggesting right atrial pressure of 3 mmHg.   Family Communication: Daughter Adam Arellano at bedside.  Disposition: Status is: Inpatient Remains inpatient appropriate because: Stroke work-up has  been completed.  Finalizing discharge pathway.   Planned Discharge Destination: To be determined; CIR versus skilled nursing facility Vs Home with home health services..  Time spent: 35 minutes  Author: Barton Dubois, MD 04/04/2022 1:28 PM  For on call review [email or web address].

## 2022-04-04 NOTE — Progress Notes (Signed)
Pt remains up in recliner, daughter and visitors as bedside. No change in neuro assessment. Daughter states that pt was able to feed himself using fork in left hand with some assistance. Pt continues to deny c/o pain or voice any concerns.

## 2022-04-04 NOTE — Progress Notes (Signed)
Pt with noticeable improvement in recognition of objects and words in the NIHSS flash cards. Pt still unable to read sentences but can recognize letters and single words with no speech delay/aphasia. Pt recognized and identified all objects on flash card. Pt now can state why he is in the hospital, "I had a stroke", and can tell me the names of each visitor he's had today.

## 2022-04-04 NOTE — Plan of Care (Signed)
  Problem: Education: Goal: Knowledge of disease or condition will improve Outcome: Progressing   Problem: Ischemic Stroke/TIA Tissue Perfusion: Goal: Complications of ischemic stroke/TIA will be minimized Outcome: Progressing   Problem: Coping: Goal: Will verbalize positive feelings about self Outcome: Progressing Goal: Will identify appropriate support needs Outcome: Progressing   Problem: Health Behavior/Discharge Planning: Goal: Ability to manage health-related needs will improve Outcome: Progressing Goal: Goals will be collaboratively established with patient/family Outcome: Progressing   Problem: Self-Care: Goal: Ability to participate in self-care as condition permits will improve Outcome: Progressing Goal: Verbalization of feelings and concerns over difficulty with self-care will improve Outcome: Progressing Goal: Ability to communicate needs accurately will improve Outcome: Progressing   Problem: Nutrition: Goal: Risk of aspiration will decrease Outcome: Progressing Goal: Dietary intake will improve Outcome: Progressing   Problem: Clinical Measurements: Goal: Ability to maintain clinical measurements within normal limits will improve Outcome: Progressing Goal: Will remain free from infection Outcome: Progressing Goal: Diagnostic test results will improve Outcome: Progressing Goal: Cardiovascular complication will be avoided Outcome: Progressing   Problem: Activity: Goal: Risk for activity intolerance will decrease Outcome: Progressing   Problem: Nutrition: Goal: Adequate nutrition will be maintained Outcome: Progressing   Problem: Coping: Goal: Level of anxiety will decrease Outcome: Progressing   Problem: Elimination: Goal: Will not experience complications related to bowel motility Outcome: Progressing Goal: Will not experience complications related to urinary retention Outcome: Progressing   Problem: Pain Managment: Goal: General experience  of comfort will improve Outcome: Progressing   Problem: Safety: Goal: Ability to remain free from injury will improve Outcome: Progressing   Problem: Skin Integrity: Goal: Risk for impaired skin integrity will decrease Outcome: Progressing

## 2022-04-05 ENCOUNTER — Telehealth: Payer: Self-pay | Admitting: *Deleted

## 2022-04-05 DIAGNOSIS — N1832 Chronic kidney disease, stage 3b: Secondary | ICD-10-CM | POA: Diagnosis not present

## 2022-04-05 DIAGNOSIS — I639 Cerebral infarction, unspecified: Secondary | ICD-10-CM | POA: Diagnosis not present

## 2022-04-05 DIAGNOSIS — E876 Hypokalemia: Secondary | ICD-10-CM | POA: Diagnosis not present

## 2022-04-05 LAB — CBC
HCT: 36 % — ABNORMAL LOW (ref 39.0–52.0)
Hemoglobin: 11.8 g/dL — ABNORMAL LOW (ref 13.0–17.0)
MCH: 31.5 pg (ref 26.0–34.0)
MCHC: 32.8 g/dL (ref 30.0–36.0)
MCV: 96 fL (ref 80.0–100.0)
Platelets: 247 10*3/uL (ref 150–400)
RBC: 3.75 MIL/uL — ABNORMAL LOW (ref 4.22–5.81)
RDW: 12.6 % (ref 11.5–15.5)
WBC: 8.7 10*3/uL (ref 4.0–10.5)
nRBC: 0 % (ref 0.0–0.2)

## 2022-04-05 LAB — BASIC METABOLIC PANEL
Anion gap: 7 (ref 5–15)
BUN: 30 mg/dL — ABNORMAL HIGH (ref 8–23)
CO2: 24 mmol/L (ref 22–32)
Calcium: 8.3 mg/dL — ABNORMAL LOW (ref 8.9–10.3)
Chloride: 108 mmol/L (ref 98–111)
Creatinine, Ser: 1.5 mg/dL — ABNORMAL HIGH (ref 0.61–1.24)
GFR, Estimated: 45 mL/min — ABNORMAL LOW (ref >60)
Glucose, Bld: 108 mg/dL — ABNORMAL HIGH (ref 70–99)
Potassium: 3.6 mmol/L (ref 3.5–5.1)
Sodium: 139 mmol/L (ref 135–145)

## 2022-04-05 NOTE — Progress Notes (Signed)
Occupational Therapy Treatment Patient Details Name: Adam Arellano MRN: [id number] DOB: 18-Dec-1931 Today's Date: 04/05/2022   History of present illness 88 y.o. M admitted on 04/02/22 due to being found at home lying in bed covered in bruises, bleeding, and with urine incontinence. CT positive for an acute stroke in the L posterior cerbral artery territory. PMH significant for  transient global amnesia in 2009, hypertension, hyperlipidemia, coronary artery disease with NSTEMI status post multiple PCI's, prostate cancer status post radical prostatectomy, severe mitral regurgitation status post TEER ( 08/19/2020).   OT comments  Pt agreeable to OT treatment. Pt showing mild improvement in balance while ambulating after prolonged weight bearing through R LE while standing at the sink. To the sink pt was unsteady with moderate assistance. Returning to chair from the sink pt was requiring more min A with RW. Pt was able to complete B UE AA/ROM exercises with verbal and tactile cuing. Pt unable to complete horizontal abduction due to fatigue and difficulty. Pt given handout for AA/ROM of the shoulder. Pt was left in the chair with call bell within reach and chair alarm set. Pt will benefit from continued OT in the hospital and recommended venue below to increase strength, balance, and endurance for safe ADL's.      Recommendations for follow up therapy are one component of a multi-disciplinary discharge planning process, led by the attending physician.  Recommendations may be updated based on patient status, additional functional criteria and insurance authorization.    Follow Up Recommendations  Acute inpatient rehab (3hours/day)    Assistance Recommended at Discharge Frequent or constant Supervision/Assistance  Patient can return home with the following  A lot of help with walking and/or transfers;A lot of help with bathing/dressing/bathroom;Assistance with cooking/housework;Direct  supervision/assist for medications management;Assistance with feeding;Direct supervision/assist for financial management;Assist for transportation;Help with stairs or ramp for entrance   Equipment Recommendations  BSC/3in1    Recommendations for Other Services Rehab consult    Precautions / Restrictions Precautions Precautions: Fall Restrictions Weight Bearing Restrictions: No       Mobility Bed Mobility Overal bed mobility: Needs Assistance Bed Mobility: Supine to Sit     Supine to sit: Supervision     General bed mobility comments: Mild labored movement.    Transfers Overall transfer level: Needs assistance Equipment used: Rolling walker (2 wheels) Transfers: Sit to/from Stand, Bed to chair/wheelchair/BSC Sit to Stand: Min guard, Min assist     Step pivot transfers: Min assist, Mod assist     General transfer comment: Pt still boosting from EOB with min G assist to min A. Moderate assistance for initial pivot to chair but more min A for pivot back to chair from standing at sink. Continued physical assist to navigate room with RW in a proper position.     Balance Overall balance assessment: Needs assistance Sitting-balance support: Feet supported, Bilateral upper extremity supported Sitting balance-Leahy Scale: Good Sitting balance - Comments: fear to good seated at EOB   Standing balance support: Bilateral upper extremity supported, During functional activity, Reliant on assistive device for balance Standing balance-Leahy Scale: Poor Standing balance comment: poor to fair using RW                           ADL either performed or assessed with clinical judgement   ADL Overall ADL's : Needs assistance/impaired     Grooming: Min guard;Standing;Oral care;Wash/dry face Grooming Details (indicate cue type and reason): Pt able to  stand at sink and complete oral care and washing of his face with RW available. Verbal cuing for location of grooming items.                              Functional mobility during ADLs: Minimal assistance;Moderate assistance;Rolling walker (2 wheels) General ADL Comments: Pt able to ambulate to sink and back with mod A initially but improving to min A on the way back from the sink after prolonged weight bearing while standing.    Extremity/Trunk Assessment                                Cognition Arousal/Alertness: Awake/alert Behavior During Therapy: WFL for tasks assessed/performed, Impulsive Overall Cognitive Status: Within Functional Limits for tasks assessed                                 General Comments: Verbal and tactile cuing needed to follow direction safety during transfers and upper extremity exercises.        Exercises Exercises: General Upper Extremity General Exercises - Upper Extremity Shoulder Flexion: AAROM, 10 reps, Both, Seated (x10 internal and external rotation as well with tactile cuing for proper movement.) Shoulder ABduction: Right, AAROM, 10 reps, Seated (x10 protraction as well) Shoulder Horizontal ABduction: Other (comment) (Attempted but pt was too weak and fatiuged after two reps with tactile cuing.)            Pertinent Vitals/ Pain       Pain Assessment Pain Assessment: No/denies pain                                                          Frequency  Min 2X/week        Progress Toward Goals  OT Goals(current goals can now be found in the care plan section)  Progress towards OT goals: Progressing toward goals  Acute Rehab OT Goals Patient Stated Goal: To get his independence back OT Goal Formulation: With patient/family Time For Goal Achievement: 04/17/22 Potential to Achieve Goals: Good ADL Goals Pt Will Perform Grooming: with set-up;sitting Pt Will Perform Lower Body Bathing: with min assist;sitting/lateral leans;sit to/from stand Pt Will Perform Lower Body Dressing: with min  assist;sitting/lateral leans;sit to/from stand Pt Will Transfer to Toilet: with min assist;ambulating Pt Will Perform Toileting - Clothing Manipulation and hygiene: with min assist;sit to/from stand;sitting/lateral leans  Plan Discharge plan remains appropriate                                    End of Session Equipment Utilized During Treatment: Gait belt;Rolling walker (2 wheels)  OT Visit Diagnosis: Unsteadiness on feet (R26.81);Other abnormalities of gait and mobility (R26.89);Muscle weakness (generalized) (M62.81);Hemiplegia and hemiparesis Hemiplegia - Right/Left: Right Hemiplegia - dominant/non-dominant: Dominant Hemiplegia - caused by: Cerebral infarction   Activity Tolerance Patient tolerated treatment well   Patient Left in chair;with call bell/phone within reach;with chair alarm set   Nurse Communication          Time: 6286-3817 OT Time Calculation (min): 23 min  Charges: OT General  Charges $OT Visit: 1 Visit OT Treatments $Self Care/Home Management : 8-22 mins $Therapeutic Exercise: 8-22 mins  Emmajo Bennette OT, MOT  Larey Seat 04/05/2022, 9:24 AM

## 2022-04-05 NOTE — Telephone Encounter (Signed)
Order from [facility] requesting 30 day monitor for stroke. Pt enrolled in [employer]. Order placed.

## 2022-04-05 NOTE — Care Management Important Message (Signed)
Important Message  Patient Details  Name: Adam Arellano MRN: [id number] Date of Birth: 21-Dec-1931   Medicare Important Message Given:  Yes (copy given to daughter in room)     [name] Medal 04/05/2022, 11:55 AM

## 2022-04-05 NOTE — Progress Notes (Signed)
Inpatient Rehab Admissions Coordinator:   I continue to await insurance auth for CIR. I will continue to follow for potential admit once auth is received.   Clemens Catholic, [place], [place] Admissions Coordinator  [phone] ([place]) [phone] (office)

## 2022-04-05 NOTE — Progress Notes (Signed)
Physical Therapy Treatment Patient Details Name: Adam Arellano MRN: [id number] DOB: 1931/10/06 Today's Date: 04/05/2022   History of Present Illness 88 y.o. M admitted on 04/02/22 due to being found at home lying in bed covered in bruises, bleeding, and with urine incontinence. CT positive for an acute stroke in the L posterior cerbral artery territory. PMH significant for  transient global amnesia in 2009, hypertension, hyperlipidemia, coronary artery disease with NSTEMI status post multiple PCI's, prostate cancer status post radical prostatectomy, severe mitral regurgitation status post TEER ( 08/19/2020).    PT Comments    Patient presents seated in chair (assisted by nursing staff) and agreeable for therapy.  Patient demonstrates fair/good return for completing BLE ROM/strengthening exercises requiring occasional verbal cueing to decrease velocity of movement RLE to limit incoordination with fair carryover, during ambulation requires repeated verbal/tactile cueing to step closer to RW with fair carryover and has most difficulty making turns due to right sided weakness and incoordination.  Patient tolerated staying up in chair after therapy with his daughter present in room.  Patient will benefit from continued skilled physical therapy in hospital and recommended venue below to increase strength, balance, endurance for safe ADLs and gait.    Recommendations for follow up therapy are one component of a multi-disciplinary discharge planning process, led by the attending physician.  Recommendations may be updated based on patient status, additional functional criteria and insurance authorization.  Follow Up Recommendations  Acute inpatient rehab (3hours/day)     Assistance Recommended at Discharge Intermittent Supervision/Assistance  Patient can return home with the following A lot of help with walking and/or transfers;A little help with bathing/dressing/bathroom;Help with stairs or ramp for  entrance;Assistance with cooking/housework   Equipment Recommendations  Rolling walker (2 wheels)    Recommendations for Other Services       Precautions / Restrictions Precautions Precautions: Fall Restrictions Weight Bearing Restrictions: No     Mobility  Bed Mobility               General bed mobility comments: Presents seated in chair (assisted by nursing staff)    Transfers Overall transfer level: Needs assistance Equipment used: Rolling walker (2 wheels) Transfers: Sit to/from Stand, Bed to chair/wheelchair/BSC Sit to Stand: Min guard, Min assist   Step pivot transfers: Min assist, Mod assist       General transfer comment: unsteady labored movement with incoordination of right side    Ambulation/Gait Ambulation/Gait assistance: Min assist, Mod assist Gait Distance (Feet): 65 Feet Assistive device: Rolling walker (2 wheels) Gait Pattern/deviations: Decreased step length - right, Decreased step length - left, Decreased stance time - right, Ataxic, Steppage Gait velocity: decreased     General Gait Details: increased endurance/distance for taking steps with requiring frequent verbal/tactile cueing to step closer to RW with fair carryover, has diffiuclty making turns to the right due, limited right ankle dorsiflexion with frequent steppage gait, fair/poor carryover for right heel to toe stepping   Stairs             Wheelchair Mobility    Modified Rankin (Stroke Patients Only)       Balance Overall balance assessment: Needs assistance Sitting-balance support: Feet supported, No upper extremity supported Sitting balance-Leahy Scale: Fair Sitting balance - Comments: fair/seated unsupported in chair   Standing balance support: Reliant on assistive device for balance, During functional activity, Bilateral upper extremity supported Standing balance-Leahy Scale: Poor Standing balance comment: fair/poor using RW  Cognition Arousal/Alertness: Awake/alert Behavior During Therapy: WFL for tasks assessed/performed, Impulsive Overall Cognitive Status: Within Functional Limits for tasks assessed                                          Exercises General Exercises - Lower Extremity Long Arc Quad: Seated, AROM, Strengthening, Both, 15 reps Hip Flexion/Marching: Seated, AROM, Strengthening, Both, 15 reps Toe Raises: Seated, AROM, Strengthening, Both, 15 reps Heel Raises: Seated, AROM, Strengthening, Both, 15 reps    General Comments        Pertinent Vitals/Pain Pain Assessment Pain Assessment: No/denies pain    Home Living                          Prior Function            PT Goals (current goals can now be found in the care plan section) Acute Rehab PT Goals Patient Stated Goal: return home after rehab PT Goal Formulation: With patient/family Time For Goal Achievement: 04/17/22 Potential to Achieve Goals: Good Progress towards PT goals: Progressing toward goals    Frequency    Min 4X/week      PT Plan Current plan remains appropriate    Co-evaluation              AM-PAC PT "6 Clicks" Mobility   Outcome Measure  Help needed turning from your back to your side while in a flat bed without using bedrails?: A Little Help needed moving from lying on your back to sitting on the side of a flat bed without using bedrails?: A Little Help needed moving to and from a bed to a chair (including a wheelchair)?: A Little Help needed standing up from a chair using your arms (e.g., wheelchair or bedside chair)?: A Little Help needed to walk in hospital room?: A Lot Help needed climbing 3-5 steps with a railing? : A Lot 6 Click Score: 16    End of Session Equipment Utilized During Treatment: Gait belt Activity Tolerance: Patient tolerated treatment well;Patient limited by fatigue Patient left: in chair;with call bell/phone within reach;with  family/visitor present;with chair alarm set Nurse Communication: Mobility status PT Visit Diagnosis: Unsteadiness on feet (R26.81);Other abnormalities of gait and mobility (R26.89);Muscle weakness (generalized) (M62.81)     Time: 7341-9379 PT Time Calculation (min) (ACUTE ONLY): 24 min  Charges:  $Gait Training: 8-22 mins $Therapeutic Exercise: 8-22 mins                     12:23 PM, 04/05/22 Lonell Grandchild, MPT Physical Therapist with [facility] [phone] office [phone] mobile phone

## 2022-04-05 NOTE — Patient Instructions (Signed)

## 2022-04-05 NOTE — Progress Notes (Addendum)
PROGRESS NOTE  Adam Arellano KLK:[id number] DOB: 01-21-1932 DOA: 04/02/2022 PCP: Lajean Manes, MD  Brief History:  As per H&P written by Dr. Clearence Ped on 04/02/22 Adam Arellano is a 88 y.o. male with medical history significant of history of coronary artery disease, hyperlipidemia, GERD, hypertension, status post mitral valve clip implant, and more presents to the ED with a chief complaint of altered mental status.  Is reported the daughter spoke to patient last yesterday around 4 PM and he was normal at that time.  Patient reports that he was normal at the time that he went to bed.  Reports he was also normal when he got up this morning.  He reports that he remembers eating breakfast and felt that he was in his normal state of health at that time as well.,  And then daughter was trying to get a hold of him later in the day and could not reach him.  She sent somebody to check on him and he was found in his bed saturated in urine, with bruises and blood around, everything knocked off the nightstand, and with slurred speech.  Patient reports he does not remember that.  He does not remember falling.  Reports he is not in any pain right now.  He does not remember having a headache, dizziness, chest pain, palpitations, dyspnea.  Daughter reports at baseline he has clear speech, lives independently, takes care of his own ADLs.  He has no history of tremor, although there is a right upper extremity tremor at this time.  He has no history of contracture although he is holding his right upper extremity in a fist, unless he really focuses on opening his hand.  Patient is no longer slurred, but it is reported that it was slurred when he was found.  He also had incoherent speech where the words were not making sense when he was found.  Patient is not able to contribute any more to this history.  Of note, he does have a mitral valve clip and compatibility with MRI is unknown.   Patient does not  smoke, does not drink.  He is vaccinated for COVID.  Patient is full code.    Assessment and Plan: * Acute ischemic stroke ([place]) - Right upper extremity weakness, reported dysarthria when he was found and is experiencing right hemianopsia. -Patient has past swallowing test -LDL 69, A1c 5.5 -MRI demonstrating left PCA infarct -Appreciate neurology service>>continue aspirin and Plavix for 46-monththen ASA 81 mg daily thereafter for secondary prevention. -Physical therapy recommending CIR -2D echo--EF 55-60, no WMA, G1DD, no PFO - EEG--no seizure -initially allowed for permissive hypertension. -Patient will also need 30 days event monitoring(cardiology has been made aware).  Fall at home, initial encounter - Likely related to CVA - No fractures appreciated -Physical therapy has seen patient with recommendation for CIR -Continue supportive care and assist -Maintain adequate hydration.  Hypokalemia - Repleted and within normal limits currently -Continue to follow electrolytes trend.  Chronic kidney disease, stage 3b (HCC) -baseline creatinine 1.4-1.6 - Appears to be stable -Maintain adequate hydration -Continue to follow renal function trend.  Essential hypertension - Follow-up vital signs -Blood pressure overall is stable while allowing permissive hypertension -Slowly resume back antihypertensive agents -Heart healthy diet discussed with patient.       Family Communication:   Family at bedside updated 11/1  Consultants:  neurology, CIR  Code Status:  FULL   DVT Prophylaxis:  [place] Heparin   Procedures: As Listed in Progress Note Above  Antibiotics: None      Subjective: Pt states vision is unchanged.  Feels right arm and leg weakness are improving.  Denies f/c, cp, sob, n/v/d, abd pain  Objective: Vitals:   04/04/22 2229 04/05/22 0141 04/05/22 0623 04/05/22 1551  BP: 136/71 (!) 143/84 135/79 (!) 158/81  Pulse: 64 68 70 88  Resp: '16 15 15 18  '$ Temp:  97.9 F (36.6 C) 98.3 F (36.8 C) 98.6 F (37 C) 98.7 F (37.1 C)  TempSrc: Axillary Oral Oral Oral  SpO2: 99% 98% 98% 96%  Weight:      Height:        Intake/Output Summary (Last 24 hours) at 04/05/2022 1708 Last data filed at 04/05/2022 0500 Gross per 24 hour  Intake 240 ml  Output 1100 ml  Net -860 ml   Weight change:  Exam:  General:  Pt is alert, follows commands appropriately, not in acute distress HEENT: No icterus, No thrush, No neck mass, [place]/AT Cardiovascular: RRR, S1/S2, no rubs, no gallops Respiratory: CTA bilaterally, no wheezing, no crackles, no rhonchi Abdomen: Soft/+BS, non tender, non distended, no guarding Extremities: No edema, No lymphangitis, No petechiae, No rashes, no synovitis    Data Reviewed: I have personally reviewed following labs and imaging studies Basic Metabolic Panel: Recent Labs  Lab 04/02/22 1901 04/03/22 0615 04/05/22 0407  NA 142 144 139  K 3.2* 3.8 3.6  CL 110 112* 108  CO2 '23 23 24  '$ GLUCOSE 105* 89 108*  BUN 37* 33* 30*  CREATININE 1.61* 1.42* 1.50*  CALCIUM 8.7* 8.4* 8.3*   Liver Function Tests: Recent Labs  Lab 04/02/22 1901 04/03/22 0615  AST 29 24  ALT 19 17  ALKPHOS 66 58  BILITOT 1.1 1.0  PROT 6.5 5.7*  ALBUMIN 3.8 3.1*   No results for input(s): "LIPASE", "AMYLASE" in the last 168 hours. No results for input(s): "AMMONIA" in the last 168 hours. Coagulation Profile: No results for input(s): "INR", "PROTIME" in the last 168 hours. CBC: Recent Labs  Lab 04/02/22 1901 04/03/22 0615 04/05/22 0407  WBC 11.5* 8.9 8.7  NEUTROABS 9.6*  --   --   HGB 12.7* 11.5* 11.8*  HCT 38.7* 35.4* 36.0*  MCV 95.8 97.3 96.0  PLT 268 233 247   Cardiac Enzymes: Recent Labs  Lab 04/02/22 1901  CKTOTAL 413*   BNP: Invalid input(s): "POCBNP" CBG: No results for input(s): "GLUCAP" in the last 168 hours. HbA1C: Recent Labs    04/03/22 0615  HGBA1C 5.5   Urine analysis:    Component Value Date/Time   COLORURINE  YELLOW 04/02/2022 1857   APPEARANCEUR CLEAR 04/02/2022 1857   LABSPEC 1.011 04/02/2022 1857   PHURINE 7.0 04/02/2022 1857   GLUCOSEU NEGATIVE 04/02/2022 1857   HGBUR SMALL (A) 04/02/2022 1857   BILIRUBINUR NEGATIVE 04/02/2022 1857   KETONESUR 20 (A) 04/02/2022 1857   PROTEINUR 30 (A) 04/02/2022 1857   UROBILINOGEN 1.0 08/26/2007 1932   NITRITE NEGATIVE 04/02/2022 1857   LEUKOCYTESUR NEGATIVE 04/02/2022 1857   Sepsis Labs: '@LABRCNTIP'$ (procalcitonin:4,lacticidven:4) ) Recent Results (from the past 240 hour(s))  Urine Culture     Status: None   Collection Time: 04/02/22  6:57 PM   Specimen: In/Out Cath Urine  Result Value Ref Range Status   Specimen Description   Final    IN/OUT CATH URINE Performed at [facility], [address], [place], [place] [address]    Special Requests   Final  NONE Performed at [facility], [address] [place], [place] [address]    Culture   Final    NO GROWTH Performed at [facility], [place] [address] [place], [place] [address]    Report Status 04/04/2022 FINAL  Final     Scheduled Meds:  aspirin EC  81 mg Oral Daily   clopidogrel  75 mg Oral Daily   gabapentin  100 mg Oral QHS   heparin  5,000 Units Subcutaneous Q8H   Continuous Infusions:  Procedures/Studies: EEG adult  Result Date: 04/21/2022 Lora Havens, MD     04/21/22  5:06 PM Patient Name: KHIREE BUKHARI MRN: [id number] Epilepsy Attending: Lora Havens Referring Physician/Provider: Lora Havens, MD Date: 04-21-2022 Duration: 21.56 mins Patient history: 88yo M with ams. EEG to evaluate for seizure Level of alertness: Awake AEDs during EEG study: GBP Technical aspects: This EEG study was done with scalp electrodes positioned according to the 10-20 International system of electrode placement. Electrical activity was reviewed with band pass filter of 1-'70Hz'$ , sensitivity of 7 uV/mm, display speed of 64m/sec with a '60Hz'$  notched filter applied as appropriate. EEG  data were recorded continuously and digitally stored.  Video monitoring was available and reviewed as appropriate. Description: The posterior dominant rhythm consists of 9-10 Hz activity of moderate voltage (25-35 uV) seen predominantly in posterior head regions, symmetric and reactive to eye opening and eye closing. EEG showed intermittent 2-3 Hz delta slowing In left parietal region. Hyperventilation and photic stimulation were not performed.   ABNORMALITY - Intermittent slow, left parietal region IMPRESSION: This study is suggestive of cortical dysfunction arising from left parietal region likely secondary to underlying stroke. No seizures or epileptiform discharges were seen throughout the recording. PLora Havens  ECHOCARDIOGRAM COMPLETE  Result Date: 1November 17, 2023   ECHOCARDIOGRAM REPORT   Patient Name:   RSHEMAR PLEMMONSDate of Exam: 1November 17, 2023Medical Rec #:  [id number]        Height:       67.0 in Accession #:    [id number]       Weight:       160.9 lb Date of Birth:  3May 26, 1933         BSA:          1.844 m Patient Age:    89years          BP:           171/78 mmHg Patient Gender: M                 HR:           75 bpm. Exam [facility]:  [facility]Procedure: 2D Echo, Cardiac Doppler and Color Doppler Indications:    Stroke  History:        Patient has prior history of Echocardiogram examinations, most                 recent 11/24/2021. CAD and Previous Myocardial Infarction,                 Stroke, Arrythmias:PVC, Signs/Symptoms:Fatigue; Risk                 Factors:Hypertension and Dyslipidemia. Mitraclip implantation                 08/19/20.                  Mitral Valve: Mitra-Clip valve is present in  the mitral                 position.  Sonographer:    Wenda Low Referring Phys: [id number] ASIA B Baldwin  1. Left ventricular ejection fraction, by estimation, is 55 to 60%. The left ventricle has normal function. The left ventricle has no regional wall motion abnormalities.  There is mild left ventricular hypertrophy. Left ventricular diastolic parameters are consistent with Grade I diastolic dysfunction (impaired relaxation).  2. Right ventricular systolic function is normal. The right ventricular size is normal. There is mildly elevated pulmonary artery systolic pressure. The estimated right ventricular systolic pressure is 91.7 mmHg.  3. Left atrial size was mildly dilated.  4. The mitral valve has been repaired/replaced. Mild mitral valve regurgitation. The mean mitral valve gradient is 3.0 mmHg. There is a Mitra-Clip present in the mitral position.  5. Tricuspid valve regurgitation is mild to moderate.  6. The aortic valve is tricuspid. Aortic valve regurgitation is not visualized. Aortic valve sclerosis/calcification is present, without any evidence of aortic stenosis. Aortic valve mean gradient measures 5.5 mmHg.  7. The inferior vena cava is normal in size with greater than 50% respiratory variability, suggesting right atrial pressure of 3 mmHg. Comparison(s): Prior images reviewed side by side. LVEF remains normal at 55-60%. MitraClip in position with mild posterior leaflet prolapse, mild mitral regurgitation and stable mean gradient. FINDINGS  Left Ventricle: Left ventricular ejection fraction, by estimation, is 55 to 60%. The left ventricle has normal function. The left ventricle has no regional wall motion abnormalities. The left ventricular internal cavity size was normal in size. There is  mild left ventricular hypertrophy. Left ventricular diastolic parameters are consistent with Grade I diastolic dysfunction (impaired relaxation). Right Ventricle: The right ventricular size is normal. No increase in right ventricular wall thickness. Right ventricular systolic function is normal. There is mildly elevated pulmonary artery systolic pressure. The tricuspid regurgitant velocity is 3.05  m/s, and with an assumed right atrial pressure of 3 mmHg, the estimated right ventricular  systolic pressure is 91.5 mmHg. Left Atrium: Left atrial size was mildly dilated. Right Atrium: Right atrial size was normal in size. Pericardium: There is no evidence of pericardial effusion. Presence of epicardial fat layer. Mitral Valve: The mitral valve has been repaired/replaced. Mild mitral valve regurgitation. There is a Mitra-Clip present in the mitral position. MV peak gradient, 9.6 mmHg. The mean mitral valve gradient is 3.0 mmHg. Tricuspid Valve: The tricuspid valve is grossly normal. Tricuspid valve regurgitation is mild to moderate. Aortic Valve: The aortic valve is tricuspid. There is mild to moderate aortic valve annular calcification. Aortic valve regurgitation is not visualized. Aortic valve sclerosis/calcification is present, without any evidence of aortic stenosis. Aortic valve mean gradient measures 5.5 mmHg. Aortic valve peak gradient measures 11.1 mmHg. Aortic valve area, by VTI measures 1.74 cm. Pulmonic Valve: The pulmonic valve was grossly normal. Pulmonic valve regurgitation is trivial. Aorta: The aortic root is normal in size and structure. Venous: The inferior vena cava is normal in size with greater than 50% respiratory variability, suggesting right atrial pressure of 3 mmHg. IAS/Shunts: No atrial level shunt detected by color flow Doppler.  LEFT VENTRICLE PLAX 2D LVIDd:         4.80 cm   Diastology LVIDs:         3.40 cm   LV e' medial:    5.22 cm/s LV PW:         1.20 cm   LV E/e' medial:  14.5 LV IVS:        1.10 cm   LV e' lateral:   12.80 cm/s LVOT diam:     2.00 cm   LV E/e' lateral: 5.9 LV SV:         63 LV SV Index:   34 LVOT Area:     3.14 cm  RIGHT VENTRICLE RV Basal diam:  3.30 cm RV Mid diam:    2.60 cm RV S prime:     12.10 cm/s TAPSE (M-mode): 2.4 cm LEFT ATRIUM             Index        RIGHT ATRIUM           Index LA diam:        4.50 cm 2.44 cm/m   RA Area:     19.10 cm LA Vol (A2C):   62.7 ml 34.01 ml/m  RA Volume:   44.60 ml  24.19 ml/m LA Vol (A4C):   64.2 ml  34.82 ml/m LA Biplane Vol: 64.7 ml 35.09 ml/m  AORTIC VALVE                     PULMONIC VALVE AV Area (Vmax):    1.65 cm      PV Vmax:       0.88 m/s AV Area (Vmean):   1.58 cm      PV Peak grad:  3.1 mmHg AV Area (VTI):     1.74 cm AV Vmax:           166.50 cm/s AV Vmean:          113.000 cm/s AV VTI:            0.362 m AV Peak Grad:      11.1 mmHg AV Mean Grad:      5.5 mmHg LVOT Vmax:         87.50 cm/s LVOT Vmean:        56.900 cm/s LVOT VTI:          0.200 m LVOT/AV VTI ratio: 0.55  AORTA Ao Root diam: 3.60 cm MITRAL VALVE                TRICUSPID VALVE MV Area (PHT): 1.91 cm     TR Peak grad:   37.2 mmHg MV Area VTI:   1.92 cm     TR Vmax:        305.00 cm/s MV Peak grad:  9.6 mmHg MV Mean grad:  3.0 mmHg     SHUNTS MV Vmax:       1.55 m/s     Systemic VTI:  0.20 m MV Vmean:      74.5 cm/s    Systemic Diam: 2.00 cm MV Decel Time: 397 msec MV E velocity: 75.80 cm/s MV A velocity: 126.00 cm/s MV E/A ratio:  0.60 Rozann Lesches MD Electronically signed by Rozann Lesches MD Signature Date/Time: 04/03/2022/1:31:51 PM    Final    US Carotid Bilateral (at [facility] and [facility] only)  Result Date: 04/03/2022 CLINICAL DATA:  88 year old male with syncope and altered mental status EXAM: BILATERAL CAROTID DUPLEX ULTRASOUND TECHNIQUE: Pearline Cables scale imaging, color Doppler and duplex ultrasound were performed of bilateral carotid and vertebral arteries in the neck. COMPARISON:  None Available. FINDINGS: Criteria: Quantification of carotid stenosis is based on velocity parameters that correlate the residual internal carotid diameter with NASCET-based stenosis levels, using the diameter of the distal internal carotid lumen  as the denominator for stenosis measurement. The following velocity measurements were obtained: RIGHT ICA:  Systolic 102 cm/sec, Diastolic 32 cm/sec CCA:  99 cm/sec SYSTOLIC ICA/CCA RATIO:  1.7 ECA:  119 cm/sec LEFT ICA:  Systolic 78 cm/sec, Diastolic 15 cm/sec CCA:  725 cm/sec SYSTOLIC ICA/CCA RATIO:   0.5 ECA:  109 cm/sec Right Brachial SBP: Not acquired Left Brachial SBP: Not acquired RIGHT CAROTID ARTERY: No significant calcifications of the right common carotid artery. Intermediate waveform maintained. Heterogeneous and partially calcified plaque at the right carotid bifurcation. No significant lumen shadowing. Low resistance waveform of the right ICA. No significant tortuosity. RIGHT VERTEBRAL ARTERY: Antegrade flow with low resistance waveform. LEFT CAROTID ARTERY: No significant calcifications of the left common carotid artery. Intermediate waveform maintained. Heterogeneous and partially calcified plaque at the left carotid bifurcation without significant lumen shadowing. Low resistance waveform of the left ICA. No significant tortuosity. LEFT VERTEBRAL ARTERY:  Antegrade flow with low resistance waveform. IMPRESSION: Right: Heterogeneous and partially calcified plaque at the right carotid bifurcation, with discordant results regarding degree of stenosis by established duplex criteria. Peak velocity suggests 50%-69% stenosis, with the ICA/ CCA ratio suggesting a lesser degree of stenosis. If establishing a more accurate degree of stenosis is required, cerebral angiogram should be considered, or as a second best test, CTA. Left: Color duplex indicates minimal heterogeneous and calcified plaque, with no hemodynamically significant stenosis by duplex criteria in the extracranial cerebrovascular circulation. Signed, Dulcy Fanny. Nadene Rubins, RPVI [facility] Electronically Signed   By: Corrie Mckusick D.O.   On: 04/03/2022 09:40   MR BRAIN WO CONTRAST  Result Date: 04/03/2022 CLINICAL DATA:  Stroke, follow up EXAM: MRI HEAD WITHOUT CONTRAST MRA HEAD WITHOUT CONTRAST TECHNIQUE: Multiplanar, multi-echo pulse sequences of the brain and surrounding structures were acquired without intravenous contrast. Angiographic images of the Circle of Willis were  acquired using MRA technique without intravenous contrast. COMPARISON:  CT head April 02, 2022 FINDINGS: MRI HEAD FINDINGS Brain: Acute left PCA territory infarct, including restricted diffusion in the left occipital lobe as well as the left thalamocapsular junction. Edema without mass effect. Additional mild for age scattered T2/FLAIR hyperintensities in the white matter, nonspecific but compatible with chronic microvascular ischemic disease. No evidence of acute hemorrhage, mass lesion, midline shift or hydrocephalus. Vascular: Detailed below. Skull and upper cervical spine: Normal marrow signal. Sinuses/Orbits: Clear sinuses.  No acute orbital findings. Other: Trace left mastoid effusion. MRA HEAD FINDINGS Motion limited study. Anterior circulation: Bilateral intracranial ICAs, MCAs, and ACAs are patent. Potentially severe right prox A2 ACA stenosis. Approximately 3 mm outpouching arising from the left MCA bifurcation, concerning for aneurysm. Posterior circulation: Bilateral visualized intradural vertebral arteries and basilar artery are patent without significant stenosis. Right PCAs patent without proximal high-grade stenosis. Occlusion (favored) versus severe stenosis of the left P2 PCA. IMPRESSION: MRI: Acute left PCA territory infarct. MRA: 1. Occlusion (favored) versus severe stenosis of the left P2 PCA. 2. Potentially severe proximal right A2 ACA stenosis. 3. Approximately 3 mm outpouching arising from the left MCA bifurcation, concerning for aneurysm. Recommend CTA to confirm and further characterize. Electronically Signed   By: Margaretha Sheffield M.D.   On: 04/03/2022 08:58   MR ANGIO HEAD WO CONTRAST  Result Date: 04/03/2022 CLINICAL DATA:  Stroke, follow up EXAM: MRI HEAD WITHOUT CONTRAST MRA HEAD WITHOUT CONTRAST TECHNIQUE: Multiplanar, multi-echo pulse sequences of the brain and surrounding structures were acquired without intravenous contrast. Angiographic images of the Circle of  Willis were  acquired using MRA technique without intravenous contrast. COMPARISON:  CT head April 02, 2022 FINDINGS: MRI HEAD FINDINGS Brain: Acute left PCA territory infarct, including restricted diffusion in the left occipital lobe as well as the left thalamocapsular junction. Edema without mass effect. Additional mild for age scattered T2/FLAIR hyperintensities in the white matter, nonspecific but compatible with chronic microvascular ischemic disease. No evidence of acute hemorrhage, mass lesion, midline shift or hydrocephalus. Vascular: Detailed below. Skull and upper cervical spine: Normal marrow signal. Sinuses/Orbits: Clear sinuses.  No acute orbital findings. Other: Trace left mastoid effusion. MRA HEAD FINDINGS Motion limited study. Anterior circulation: Bilateral intracranial ICAs, MCAs, and ACAs are patent. Potentially severe right prox A2 ACA stenosis. Approximately 3 mm outpouching arising from the left MCA bifurcation, concerning for aneurysm. Posterior circulation: Bilateral visualized intradural vertebral arteries and basilar artery are patent without significant stenosis. Right PCAs patent without proximal high-grade stenosis. Occlusion (favored) versus severe stenosis of the left P2 PCA. IMPRESSION: MRI: Acute left PCA territory infarct. MRA: 1. Occlusion (favored) versus severe stenosis of the left P2 PCA. 2. Potentially severe proximal right A2 ACA stenosis. 3. Approximately 3 mm outpouching arising from the left MCA bifurcation, concerning for aneurysm. Recommend CTA to confirm and further characterize. Electronically Signed   By: Margaretha Sheffield M.D.   On: 04/03/2022 08:58   CT Head Wo Contrast  Result Date: 04/02/2022 CLINICAL DATA:  Altered mental status with trauma to the head, face, and cervical spine. The patient is reportedly found normal approximately 24 hours ago. EXAM: CT HEAD WITHOUT CONTRAST CT MAXILLOFACIAL WITHOUT CONTRAST CT CERVICAL SPINE WITHOUT CONTRAST TECHNIQUE: Multidetector  CT imaging of the head, cervical spine, and maxillofacial structures were performed using the standard protocol without intravenous contrast. Multiplanar CT image reconstructions of the cervical spine and maxillofacial structures were also generated. RADIATION DOSE REDUCTION: This exam was performed according to the departmental dose-optimization program which includes automated exposure control, adjustment of the mA and/or kV according to patient size and/or use of iterative reconstruction technique. COMPARISON:  CT head dated 08/26/2007. FINDINGS: CT HEAD FINDINGS Brain: There is loss of the gray-white matter differentiation in a moderate volume region of the left temporal lobe and occipital lobe in the left posterior cerebral artery territory. There is no significant swelling of the parenchyma or associated mass effect, suggestive of a hyperacute to acute infarct. No acute intra or extra-axial fluid collection is identified. There is mild cerebral volume loss with associated ex vacuo dilatation. There is no midline shift and the basilar cisterns are patent. Periventricular white matter hypoattenuation likely represents chronic small vessel ischemic disease. Vascular: There are vascular calcifications in the carotid siphons. Skull: Normal. Negative for fracture or focal lesion. Other: Soft tissue density in the left middle ear likely reflects the patient's known cholesteatoma. CT MAXILLOFACIAL FINDINGS Osseous: No fracture or mandibular dislocation. No destructive process. Orbits: Negative. No traumatic or inflammatory finding. Sinuses: There is right maxillary sinus disease. Soft tissues: Negative. CT CERVICAL SPINE FINDINGS Alignment: There is chronic appearing anterolisthesis of C2 on C3 C3 on C4, each measuring 3 mm. There is 2 mm retrolisthesis of C4 on C5. No acute traumatic listhesis is identified. Skull base and vertebrae: No acute fracture. No primary bone lesion or focal pathologic process. Soft tissues  and spinal canal: No prevertebral fluid or swelling. No visible canal hematoma. Disc levels: Up to severe multilevel degenerative disc and joint disease. Upper chest: Negative. Other: None. IMPRESSION: 1. Moderate volume hyperacute to acute infarct  in the left posterior cerebral artery territory without significant associated mass effect. 2. No acute facial bone fracture. 3. No acute osseous injury in the cervical spine. These results were called by telephone at the time of interpretation on 04/02/2022 at 8:27 pm to provider [facility] ZAMMIT , who verbally acknowledged these results. Electronically Signed   By: Zerita Boers M.D.   On: 04/02/2022 20:31   CT Cervical Spine Wo Contrast  Result Date: 04/02/2022 CLINICAL DATA:  Altered mental status with trauma to the head, face, and cervical spine. The patient is reportedly found normal approximately 24 hours ago. EXAM: CT HEAD WITHOUT CONTRAST CT MAXILLOFACIAL WITHOUT CONTRAST CT CERVICAL SPINE WITHOUT CONTRAST TECHNIQUE: Multidetector CT imaging of the head, cervical spine, and maxillofacial structures were performed using the standard protocol without intravenous contrast. Multiplanar CT image reconstructions of the cervical spine and maxillofacial structures were also generated. RADIATION DOSE REDUCTION: This exam was performed according to the departmental dose-optimization program which includes automated exposure control, adjustment of the mA and/or kV according to patient size and/or use of iterative reconstruction technique. COMPARISON:  CT head dated 08/26/2007. FINDINGS: CT HEAD FINDINGS Brain: There is loss of the gray-white matter differentiation in a moderate volume region of the left temporal lobe and occipital lobe in the left posterior cerebral artery territory. There is no significant swelling of the parenchyma or associated mass effect, suggestive of a hyperacute to acute infarct. No acute intra or extra-axial fluid collection is identified. There  is mild cerebral volume loss with associated ex vacuo dilatation. There is no midline shift and the basilar cisterns are patent. Periventricular white matter hypoattenuation likely represents chronic small vessel ischemic disease. Vascular: There are vascular calcifications in the carotid siphons. Skull: Normal. Negative for fracture or focal lesion. Other: Soft tissue density in the left middle ear likely reflects the patient's known cholesteatoma. CT MAXILLOFACIAL FINDINGS Osseous: No fracture or mandibular dislocation. No destructive process. Orbits: Negative. No traumatic or inflammatory finding. Sinuses: There is right maxillary sinus disease. Soft tissues: Negative. CT CERVICAL SPINE FINDINGS Alignment: There is chronic appearing anterolisthesis of C2 on C3 C3 on C4, each measuring 3 mm. There is 2 mm retrolisthesis of C4 on C5. No acute traumatic listhesis is identified. Skull base and vertebrae: No acute fracture. No primary bone lesion or focal pathologic process. Soft tissues and spinal canal: No prevertebral fluid or swelling. No visible canal hematoma. Disc levels: Up to severe multilevel degenerative disc and joint disease. Upper chest: Negative. Other: None. IMPRESSION: 1. Moderate volume hyperacute to acute infarct in the left posterior cerebral artery territory without significant associated mass effect. 2. No acute facial bone fracture. 3. No acute osseous injury in the cervical spine. These results were called by telephone at the time of interpretation on 04/02/2022 at 8:27 pm to provider [facility] ZAMMIT , who verbally acknowledged these results. Electronically Signed   By: Zerita Boers M.D.   On: 04/02/2022 20:31   CT Maxillofacial Wo Contrast  Result Date: 04/02/2022 CLINICAL DATA:  Altered mental status with trauma to the head, face, and cervical spine. The patient is reportedly found normal approximately 24 hours ago. EXAM: CT HEAD WITHOUT CONTRAST CT MAXILLOFACIAL WITHOUT CONTRAST CT  CERVICAL SPINE WITHOUT CONTRAST TECHNIQUE: Multidetector CT imaging of the head, cervical spine, and maxillofacial structures were performed using the standard protocol without intravenous contrast. Multiplanar CT image reconstructions of the cervical spine and maxillofacial structures were also generated. RADIATION DOSE REDUCTION: This exam was performed according to the departmental  dose-optimization program which includes automated exposure control, adjustment of the mA and/or kV according to patient size and/or use of iterative reconstruction technique. COMPARISON:  CT head dated 08/26/2007. FINDINGS: CT HEAD FINDINGS Brain: There is loss of the gray-white matter differentiation in a moderate volume region of the left temporal lobe and occipital lobe in the left posterior cerebral artery territory. There is no significant swelling of the parenchyma or associated mass effect, suggestive of a hyperacute to acute infarct. No acute intra or extra-axial fluid collection is identified. There is mild cerebral volume loss with associated ex vacuo dilatation. There is no midline shift and the basilar cisterns are patent. Periventricular white matter hypoattenuation likely represents chronic small vessel ischemic disease. Vascular: There are vascular calcifications in the carotid siphons. Skull: Normal. Negative for fracture or focal lesion. Other: Soft tissue density in the left middle ear likely reflects the patient's known cholesteatoma. CT MAXILLOFACIAL FINDINGS Osseous: No fracture or mandibular dislocation. No destructive process. Orbits: Negative. No traumatic or inflammatory finding. Sinuses: There is right maxillary sinus disease. Soft tissues: Negative. CT CERVICAL SPINE FINDINGS Alignment: There is chronic appearing anterolisthesis of C2 on C3 C3 on C4, each measuring 3 mm. There is 2 mm retrolisthesis of C4 on C5. No acute traumatic listhesis is identified. Skull base and vertebrae: No acute fracture. No  primary bone lesion or focal pathologic process. Soft tissues and spinal canal: No prevertebral fluid or swelling. No visible canal hematoma. Disc levels: Up to severe multilevel degenerative disc and joint disease. Upper chest: Negative. Other: None. IMPRESSION: 1. Moderate volume hyperacute to acute infarct in the left posterior cerebral artery territory without significant associated mass effect. 2. No acute facial bone fracture. 3. No acute osseous injury in the cervical spine. These results were called by telephone at the time of interpretation on 04/02/2022 at 8:27 pm to provider [facility] ZAMMIT , who verbally acknowledged these results. Electronically Signed   By: Zerita Boers M.D.   On: 04/02/2022 20:31   DG Chest Port 1 View  Result Date: 04/02/2022 CLINICAL DATA:  Weakness. EXAM: PORTABLE CHEST 1 VIEW COMPARISON:  11/02/2021 FINDINGS: 1913 hours. Lungs are hyperexpanded. The cardio pericardial silhouette is enlarged. Interstitial markings are diffusely coarsened with chronic features. The lungs are clear without focal pneumonia, edema, pneumothorax or pleural effusion. Telemetry leads overlie the chest. IMPRESSION: Emphysema and cardiomegaly without acute cardiopulmonary findings. Electronically Signed   By: Misty Stanley M.D.   On: 04/02/2022 19:48   DG Pelvis Portable  Result Date: 04/02/2022 CLINICAL DATA:  Weakness. EXAM: PORTABLE PELVIS 1-2 VIEWS COMPARISON:  None Available. FINDINGS: No acute fracture or dislocation. The bones are osteopenic. Degenerative changes of the lower lumbar spine. Multiple surgical clips over the pelvis. The soft tissues are grossly unremarkable. IMPRESSION: No acute fracture or dislocation. Osteopenia. Electronically Signed   By: Anner Crete M.D.   On: 04/02/2022 19:47    Orson Eva, DO  [facility]  If 7PM-7AM, please contact night-coverage [email or web address] Password [email or web address] 04/05/2022, 5:08 PM   LOS: 2 days

## 2022-04-06 DIAGNOSIS — N1832 Chronic kidney disease, stage 3b: Secondary | ICD-10-CM | POA: Diagnosis not present

## 2022-04-06 DIAGNOSIS — I639 Cerebral infarction, unspecified: Secondary | ICD-10-CM | POA: Diagnosis not present

## 2022-04-06 NOTE — H&P (Signed)
Physical Medicine and Rehabilitation Admission H&P    CC: Functional deficits secondary to acute left posterior cerebral artery infarct  HPI: Adam Arellano is an 88 year old male who was found down at his home my his neighbor on 04/02/2022. He presented to the [facility] ED with right-sided weakness, mild dysarthria and right hemianopsia on evaluation. Imaging was significant for left PICA infarct. No apparent injuries. Neurology consulted and resumed his Plavix and started aspirin 81 mg for 3 months then aspirin alone. Outside of window for tenecteplase and no evidence of LVO. Recommended resuming home statin; however, per PCP notes, the patient has stopped his home Crestor due to "aches".  SLP eval and noted expressive aphasia.  He is tolerating heart healthy diet. Home anti-hypertensives held to allow permissive hypertension. 2D echo with EF of 55-60%. A1c = 5.5%. The patient requires inpatient physical medicine and rehabilitation evaluations and treatment secondary to dysfunction due to left PICA infarct.  PMH: Significant for MR s/p TEER, CAD s/p DES, CKD stage 3b, hypertension. Review of Systems  Constitutional:  Negative for chills and fever.  HENT:  Positive for hearing loss. Negative for sore throat.        Chronic throat clearing  Eyes:  Negative for blurred vision.  Respiratory:  Negative for cough and shortness of breath.   Cardiovascular:  Negative for chest pain and palpitations.  Gastrointestinal:  Positive for constipation. Negative for nausea and vomiting.  Neurological:  Negative for dizziness and headaches.  Endo/Heme/Allergies:  Bruises/bleeds easily.  Psychiatric/Behavioral:  Positive for memory loss.    Past Medical History:  Diagnosis Date   Asthma    a. Childhood.   Coronary artery disease    a. NSTEMI s/p DES to dRCA 2007. b. Angina 07/2009: s/p DESx2 to mid RCA for ISR and prox RCA, EF 65%. c. Stress test 05/2013: low risk, no evidence of ischemia, EF 61%.    Dyslipidemia    GERD (gastroesophageal reflux disease)    History of kidney stones    Hypertension    Peripheral vascular disease (HCC)    Prostate cancer ([place])    a. s/p radical prostatectomy.   PVC's (premature ventricular contractions)    S/P mitral valve clip implantation 08/19/2020   s/p MitraClip implantation with a XTW by Dr. Burt Knack    Transient global amnesia    a. Adm 2009: imaging nonacute, had resolved.   Past Surgical History:  Procedure Laterality Date   CARDIAC CATHETERIZATION     COLONOSCOPY     CORONARY STENT INTERVENTION N/A 08/06/2020   Procedure: CORONARY STENT INTERVENTION;  Surgeon: Sherren Mocha, MD;  Location: [facility];  Service: Cardiovascular;  Laterality: N/A;   EYE SURGERY Bilateral    cataracts removed   MASTOIDECTOMY     L ear   MITRAL VALVE REPAIR     MITRAL VALVE REPAIR N/A 08/19/2020   Procedure: MITRAL VALVE REPAIR;  Surgeon: Sherren Mocha, MD;  Location: [facility];  Service: Cardiovascular;  Laterality: N/A;   PROSTATECTOMY     RIGHT/LEFT HEART CATH AND CORONARY ANGIOGRAPHY N/A 08/06/2020   Procedure: RIGHT/LEFT HEART CATH AND CORONARY ANGIOGRAPHY;  Surgeon: Sherren Mocha, MD;  Location: [place] [facility];  Service: Cardiovascular;  Laterality: N/A;   TEE WITHOUT CARDIOVERSION N/A 08/06/2020   Procedure: TRANSESOPHAGEAL ECHOCARDIOGRAM (TEE);  Surgeon: Sanda Klein, MD;  Location: [facility];  Service: Cardiovascular;  Laterality: N/A;   TEE WITHOUT CARDIOVERSION  08/19/2020   TEE WITHOUT CARDIOVERSION N/A  08/19/2020   Procedure: TRANSESOPHAGEAL ECHOCARDIOGRAM (TEE);  Surgeon: Sherren Mocha, MD;  Location: [place];  Service: Cardiovascular;  Laterality: N/A;   Family History  Problem Relation Age of Onset   CAD Neg Hx    Social History:  reports that he quit smoking about 43 years ago. His smoking use included cigarettes. He has never used smokeless tobacco. He reports that he does not currently use  alcohol. He reports that he does not use drugs. Allergies:  Allergies  Allergen Reactions   Simvastatin Other (See Comments)    MUSCLE ACHES REAL BAD   Atorvastatin     Other reaction(s): stiffness, decreased energy   Lisinopril Cough   Rosuvastatin     Other reaction(s): weakness   Medications Prior to Admission  Medication Sig Dispense Refill   aspirin EC 81 MG tablet Take 81 mg by mouth daily. Swallow whole.     ciprofloxacin-dexamethasone (CIPRODEX) OTIC suspension Place 4 drops into the left ear 2 (two) times daily as needed (ear infection).     FIBER PO Take 1 capsule by mouth daily.     furosemide (LASIX) 40 MG tablet TAKE 1 TABLET DAILY (Patient taking differently: Take 40 mg by mouth daily.) 90 tablet 1   gabapentin (NEURONTIN) 100 MG capsule Take 100 mg by mouth at bedtime.     losartan (COZAAR) 25 MG tablet TAKE 1 TABLET DAILY 90 tablet 3   metoprolol tartrate (LOPRESSOR) 25 MG tablet Take 25 mg by mouth 2 (two) times daily.     Multiple Vitamin (MULTIVITAMIN WITH MINERALS) TABS tablet Take 1 tablet by mouth daily.     Polyvinyl Alcohol-Povidone (REFRESH OP) Place 1 drop into both eyes daily as needed (dry eye).     nitroGLYCERIN (NITROSTAT) 0.4 MG SL tablet PLACE 1 TABLET UNDER THE TONGUE AT ONSET OF CHEST PAIN EVERY 5 MINTUES UP TO 3 TIMES AS NEEDED (Patient taking differently: Place 0.4 mg under the tongue every 5 (five) minutes as needed for chest pain.) 25 tablet 3      Home: Home Living Family/patient expects to be discharged to:: Inpatient rehab Living Arrangements: Children Available Help at Discharge: Family Type of Home: House Home Access: Level entry Home Layout: Laundry or work area in basement, Two level, Able to live on main level with bedroom/bathroom Alternate Level Stairs-Number of Steps: 15 steps to basement Alternate Level Stairs-Rails: Right, Left, Can reach both Bathroom Shower/Tub: Gaffer, Chiropodist:  Standard Bathroom Accessibility: Yes Home Equipment: Conservation officer, nature (2 wheels), Shower seat - built in Additional Comments: Per daughter, he will be living with her and her husband once discharged  Lives With: Alone   Functional History: Prior Function Prior Level of Function : Independent/Modified Independent, Working/employed, Driving Mobility Comments: Hydrographic surveyor without AD, drives, works ADLs Comments: Completely indep with all ADL's and IADL's  Functional Status:  Mobility: Bed Mobility Overal bed mobility: Needs Assistance Bed Mobility: Supine to Sit Supine to sit: Supervision General bed mobility comments: Presents seated in chair (assisted by nursing staff) Transfers Overall transfer level: Needs assistance Equipment used: Rolling walker (2 wheels) Transfers: Sit to/from Stand, Bed to chair/wheelchair/BSC Sit to Stand: Min guard, Min assist Bed to/from chair/wheelchair/BSC transfer type:: Step pivot Step pivot transfers: Mod assist General transfer comment: Poor coordination; impulsive Ambulation/Gait Ambulation/Gait assistance: Min assist, Mod assist Gait Distance (Feet): 65 Feet Assistive device: Rolling walker (2 wheels) Gait Pattern/deviations: Decreased step length - right, Decreased step length - left, Decreased stance time -  right, Ataxic, Steppage General Gait Details: increased endurance/distance for taking steps with requiring frequent verbal/tactile cueing to step closer to RW with fair carryover, has diffiuclty making turns to the right due, limited right ankle dorsiflexion with frequent steppage gait, fair/poor carryover for right heel to toe stepping Gait velocity: decreased    ADL: ADL Overall ADL's : Needs assistance/impaired Eating/Feeding: Minimal assistance, Sitting Grooming: Min guard, Standing, Wash/dry hands Grooming Details (indicate cue type and reason): Standing at sink with RW available. Verbal cuing to locate paper towel to R  side. Upper Body Bathing: Moderate assistance, Sitting Lower Body Bathing: Maximal assistance, Sit to/from stand, Sitting/lateral leans Upper Body Dressing : Moderate assistance, Sitting Lower Body Dressing: Supervision/safety, Sitting/lateral leans Lower Body Dressing Details (indicate cue type and reason): Pt able to dof and don socks with verbal cuing for technique while seated in recliner. Pt struggled with R LE but was able to complete with adjustment of attempts by crossing R LE over his L LE to assist with ability to reach his R foot. Toilet Transfer: Moderate assistance, Ambulation, Rolling walker (2 wheels) Toilet Transfer Details (indicate cue type and reason): Pt still impulsive and unsteady with RW needing phsyical assist to keep walker clost to pt's body rather than pushed out in front. Toileting- Clothing Manipulation and Hygiene: Maximal assistance, +2 for safety/equipment, Sitting/lateral lean, Sit to/from stand Functional mobility during ADLs: Moderate assistance, Rolling walker (2 wheels) General ADL Comments: Able to ambulate in room with RW.  Cognition: Cognition Overall Cognitive Status: Within Functional Limits for tasks assessed Orientation Level: Oriented to person, Other (comment) (intermittent confusion) Year: 2021 Day of Week: Correct Attention: Focused Memory: Impaired Memory Impairment: Decreased short term memory Decreased Short Term Memory: Verbal basic Awareness: Impaired Awareness Impairment: Anticipatory impairment Executive Function: Reasoning, Organizing Reasoning: Impaired Reasoning Impairment: Verbal basic Organizing: Impaired Organizing Impairment: Verbal basic Cognition Arousal/Alertness: Awake/alert Behavior During Therapy: WFL for tasks assessed/performed, Impulsive Overall Cognitive Status: Within Functional Limits for tasks assessed Area of Impairment: Orientation, Attention, Memory, Following commands, Safety/judgement, Awareness, Problem  solving Orientation Level: Disoriented to, Place, Situation Current Attention Level: Selective Memory: Decreased short-term memory, Decreased recall of precautions Following Commands: Follows one step commands consistently, Follows one step commands inconsistently Safety/Judgement: Decreased awareness of safety, Decreased awareness of deficits Awareness: Emergent Problem Solving: Slow processing, Difficulty sequencing, Requires verbal cues General Comments: Verbal and tactile cuing needed to follow direction safety during transfers and upper extremity exercises.  Physical Exam: Blood pressure (!) 150/90, pulse 77, temperature 98.4 F (36.9 C), temperature source Oral, resp. rate 14, height '5\' 9"'$  (1.753 m), weight 68.4 kg, SpO2 98 %. Physical Exam Constitutional:      General: He is not in acute distress. HENT:     Head: Normocephalic.     Comments: Right frontal contusion    Nose: Nose normal.  Eyes:     Extraocular Movements: Extraocular movements intact.     Pupils: Pupils are equal, round, and reactive to light.  Cardiovascular:     Rate and Rhythm: Normal rate and regular rhythm.     Heart sounds: No murmur heard.    No gallop.  Pulmonary:     Effort: Pulmonary effort is normal.     Breath sounds: Normal breath sounds.  Abdominal:     General: Bowel sounds are normal. There is no distension.     Palpations: Abdomen is soft.     Tenderness: There is no abdominal tenderness.  Musculoskeletal:     Cervical back: Normal  range of motion.  Skin:    General: Skin is warm.     Comments: Right upper arm skin tears and smaller tears on forearm and dorsal aspect of hand  Neurological:     Mental Status: He is alert and oriented to person, place, and time.     Motor: Weakness present.     Comments: Pt alert. Follows basic commands. Mild dysarthria. No aphasia.  Right HH. No apparent neglect. Mild right upper extremity weakness (RTC injury). Otherwise 4-5/5. No sensory changes. No  witnessed limb ataxia   Right hand   Right forearm   Right upper arm   Results for orders placed or performed during the hospital encounter of 04/02/22 (from the past 48 hour(s))  CBC     Status: Abnormal   Collection Time: 04/05/22  4:07 AM  Result Value Ref Range   WBC 8.7 4.0 - 10.5 K/uL   RBC 3.75 (L) 4.22 - 5.81 MIL/uL   Hemoglobin 11.8 (L) 13.0 - 17.0 g/dL   HCT 36.0 (L) 39.0 - 52.0 %   MCV 96.0 80.0 - 100.0 fL   MCH 31.5 26.0 - 34.0 pg   MCHC 32.8 30.0 - 36.0 g/dL   RDW 12.6 11.5 - 15.5 %   Platelets 247 150 - 400 K/uL   nRBC 0.0 0.0 - 0.2 %    Comment: Performed at [facility], [address] [place], [place] [address]  Basic metabolic panel     Status: Abnormal   Collection Time: 04/05/22  4:07 AM  Result Value Ref Range   Sodium 139 135 - 145 mmol/L   Potassium 3.6 3.5 - 5.1 mmol/L   Chloride 108 98 - 111 mmol/L   CO2 24 22 - 32 mmol/L   Glucose, Bld 108 (H) 70 - 99 mg/dL    Comment: Glucose reference range applies only to samples taken after fasting for at least 8 hours.   BUN 30 (H) 8 - 23 mg/dL   Creatinine, Ser 1.50 (H) 0.61 - 1.24 mg/dL   Calcium 8.3 (L) 8.9 - 10.3 mg/dL   GFR, Estimated 45 (L) >60 mL/min    Comment: (NOTE) Calculated using the CKD-EPI Creatinine Equation (2021)    Anion gap 7 5 - 15    Comment: Performed at [facility], [address] [place], [place] [address]   No results found.    Blood pressure (!) 150/90, pulse 77, temperature 98.4 F (36.9 C), temperature source Oral, resp. rate 14, height '5\' 9"'$  (1.753 m), weight 68.4 kg, SpO2 98 %.  Medical Problem List and Plan: 1. Functional deficits secondary to acute left posterior cerebral artery infarct  -patient may  shower  -ELOS/Goals: 14-16 days, supervision goals with PT, OT, SLP 2.  Antithrombotics: -DVT/anticoagulation:  Pharmaceutical: Heparin  -antiplatelet therapy: aspirin 81 mg and Plavix daily for 3 months then aspirin only 3. Pain Management: Tylenol, oxycodone as  needed  -gabapentin 100 mg q HS 4. Mood/Behavior/Sleep: LCSW to evaluate and provide emotional support  -antipsychotic agents: n/a 5. Neuropsych/cognition: This patient is capable of making decisions on his own behalf. 6. Skin/Wound Care: Routine skin care checks.   -local care to multiple skin tears above  -encourage appropriate nutrition 7. Fluids/Electrolytes/Nutrition: Routine Is and Os and follow-up chemistries 8: Hypertension: monitor TID and prn (home Lasix, Cozaar, Lopressor on hold)  -bp elevated at admit 9: Hyperlipidemia: heart healthy diet (patient declined Crestor PTA) follow-up with PCP and neurology 10: CAD s/p NSTEMI/multiple PCI, DES: on DAPT; follows  with Dr. Marlou Porch 11: Prostate cancer s/p radical proctatectomy 12: Severe MR s/p TEER: continue Plavix 13: GERD: monitor 14: CKD stage 3b: baseline creatinine 10.4-1.6; follow-up BMP     Barbie Banner, PA-C 04/06/2022

## 2022-04-06 NOTE — Progress Notes (Addendum)
Physical Therapy Treatment Patient Details Name: Adam Arellano MRN: [id number] DOB: 1932/02/04 Today's Date: 04/06/2022   History of Present Illness 88 y.o. M admitted on 04/02/22 due to being found at home lying in bed covered in bruises, bleeding, and with urine incontinence. CT positive for an acute stroke in the L posterior cerbral artery territory. PMH significant for  transient global amnesia in 2009, hypertension, hyperlipidemia, coronary artery disease with NSTEMI status post multiple PCI's, prostate cancer status post radical prostatectomy, severe mitral regurgitation status post TEER ( 08/19/2020).    PT Comments    Patient presents in chair (assisted by nursing staff) and and agreeable for therapy. Patient able to complete several bilateral LE exercises seated EOB but demonstrates fair/poor seated balance with posterior and right leaning which increased with continuation of exercises. Patient demonstrates unsteady gait with decreased RLE dorsiflexion during hallway ambulation with fair carryover for RW use requiring verbal/tactile cueing for hand placement. Patient tolerated sitting up in chair after therapy, nurse aware. Patient will benefit from continued skilled physical therapy in hospital and recommended venue below to increase strength, balance, endurance for safe ADLs and gait.    Recommendations for follow up therapy are one component of a multi-disciplinary discharge planning process, led by the attending physician.  Recommendations may be updated based on patient status, additional functional criteria and insurance authorization.  Follow Up Recommendations  Acute inpatient rehab (3hours/day)     Assistance Recommended at Discharge Intermittent Supervision/Assistance  Patient can return home with the following A lot of help with walking and/or transfers;A little help with bathing/dressing/bathroom;Help with stairs or ramp for entrance;Assistance with cooking/housework    Equipment Recommendations  Rolling walker (2 wheels)    Recommendations for Other Services       Precautions / Restrictions Precautions Precautions: Fall Restrictions Weight Bearing Restrictions: No     Mobility  Bed Mobility                    Transfers Overall transfer level: Needs assistance Equipment used: None Transfers: Bed to chair/wheelchair/BSC, Sit to/from Stand Sit to Stand: Supervision, Min guard   Step pivot transfers: Supervision, Min guard       General transfer comment: pt did not use AD for transfer from chair to bed, safer with RW, impulsive    Ambulation/Gait Ambulation/Gait assistance: Mod assist Gait Distance (Feet): 45 Feet Assistive device: Rolling walker (2 wheels) Gait Pattern/deviations: Decreased step length - right, Decreased step length - left, Decreased stance time - right, Ataxic, Scissoring, Decreased dorsiflexion - right Gait velocity: decreased     General Gait Details: patient unsteady and ataxic for gait, required verbal/tactile cueing for hand placement on walker, noted to be dragging right LE with decreased dorsiflexion   Stairs             Wheelchair Mobility    Modified Rankin (Stroke Patients Only)       Balance Overall balance assessment: Needs assistance Sitting-balance support: Feet supported, No upper extremity supported Sitting balance-Leahy Scale: Poor Sitting balance - Comments: poor/fair seated EOB Postural control: Right lateral lean, Posterior lean Standing balance support: Reliant on assistive device for balance, During functional activity, Bilateral upper extremity supported Standing balance-Leahy Scale: Poor Standing balance comment: poor/fair with RW                            Cognition Arousal/Alertness: Awake/alert Behavior During Therapy: WFL for tasks assessed/performed Overall Cognitive Status: Within  Functional Limits for tasks assessed                                           Exercises General Exercises - Lower Extremity Long Arc Quad: Seated, AROM, Strengthening, Both, 10 reps Hip Flexion/Marching: Seated, AROM, Strengthening, Both, 10 reps Toe Raises: Seated, AROM, Strengthening, Both, 10 reps Heel Raises: Seated, AROM, Strengthening, Both, 10 reps    General Comments        Pertinent Vitals/Pain Pain Assessment Pain Assessment: No/denies pain    Home Living                          Prior Function            PT Goals (current goals can now be found in the care plan section) Acute Rehab PT Goals Patient Stated Goal: return home after rehab PT Goal Formulation: With patient Time For Goal Achievement: 04/18/22 Potential to Achieve Goals: Good Progress towards PT goals: Progressing toward goals    Frequency    Min 4X/week      PT Plan Current plan remains appropriate    Co-evaluation              AM-PAC PT "6 Clicks" Mobility   Outcome Measure  Help needed turning from your back to your side while in a flat bed without using bedrails?: A Little Help needed moving from lying on your back to sitting on the side of a flat bed without using bedrails?: A Little Help needed moving to and from a bed to a chair (including a wheelchair)?: A Little Help needed standing up from a chair using your arms (e.g., wheelchair or bedside chair)?: A Little Help needed to walk in hospital room?: A Lot Help needed climbing 3-5 steps with a railing? : A Lot 6 Click Score: 16    End of Session Equipment Utilized During Treatment: Gait belt Activity Tolerance: Patient tolerated treatment well;Patient limited by fatigue Patient left: in chair;with call bell/phone within reach;with chair alarm set Nurse Communication: Mobility status PT Visit Diagnosis: Unsteadiness on feet (R26.81);Other abnormalities of gait and mobility (R26.89);Muscle weakness (generalized) (M62.81)     Time: 5465-0354 PT Time  Calculation (min) (ACUTE ONLY): 23 min  Charges:  $Gait Training: 8-22 mins $Therapeutic Exercise: 8-22 mins                     Zigmund Gottron, SPT

## 2022-04-06 NOTE — Progress Notes (Signed)
PROGRESS NOTE  Adam Arellano JIR:[id number] DOB: 1931-09-01 DOA: 04/02/2022 PCP: Lajean Manes, MD  Brief History:  As per H&P written by Dr. Clearence Ped on 04/02/22 Adam Arellano is a 88 y.o. male with medical history significant of history of coronary artery disease, hyperlipidemia, GERD, hypertension, status post mitral valve clip implant, and more presents to the ED with a chief complaint of altered mental status.  Is reported the daughter spoke to patient last yesterday around 4 PM and he was normal at that time.  Patient reports that he was normal at the time that he went to bed.  Reports he was also normal when he got up this morning.  He reports that he remembers eating breakfast and felt that he was in his normal state of health at that time as well.,  And then daughter was trying to get a hold of him later in the day and could not reach him.  She sent somebody to check on him and he was found in his bed saturated in urine, with bruises and blood around, everything knocked off the nightstand, and with slurred speech.  Patient reports he does not remember that.  He does not remember falling.  Reports he is not in any pain right now.  He does not remember having a headache, dizziness, chest pain, palpitations, dyspnea.  Daughter reports at baseline he has clear speech, lives independently, takes care of his own ADLs.  He has no history of tremor, although there is a right upper extremity tremor at this time.  He has no history of contracture although he is holding his right upper extremity in a fist, unless he really focuses on opening his hand.  Patient is no longer slurred, but it is reported that it was slurred when he was found.  He also had incoherent speech where the words were not making sense when he was found.  Patient is not able to contribute any more to this history.  Of note, he does have a mitral valve clip and compatibility with MRI is unknown.   Patient does not  smoke, does not drink.  He is vaccinated for COVID.  Patient is full code.  Assessment/Plan:   Principal Problem:   Acute ischemic stroke [facility]) Active Problems:   Essential hypertension   Chronic kidney disease, stage 3b (HCC)   Hypokalemia   Fall at home, initial encounter  Assessment and Plan: * Acute ischemic stroke ([place]) - Right upper extremity weakness, reported dysarthria when he was found and is experiencing right hemianopsia. -Patient has past swallowing test -LDL 69, A1c 5.5 -MRI demonstrating left PCA infarct -Appreciate neurology service>>continue aspirin and Plavix for 10-monththen ASA 81 mg daily thereafter for secondary prevention. -Physical therapy recommending CIR -2D echo--EF 55-60, no WMA, G1DD, no PFO - EEG--no seizure -initially allowed for permissive hypertension. -Patient will also need 30 days event monitoring(cardiology has been made aware).  Fall at home, initial encounter - Likely related to CVA - No fractures appreciated -Physical therapy has seen patient with recommendation for CIR -Continue supportive care and assist -Maintain adequate hydration.  Hypokalemia - Repleted and within normal limits currently -Continue to follow electrolytes trend.  Chronic kidney disease, stage 3b (HCC) -baseline creatinine 1.4-1.6 - Appears to be stable -Maintain adequate hydration -Continue to follow renal function trend.  Essential hypertension - Follow-up vital signs -Blood pressure overall is stable while allowing permissive hypertension -Slowly resume back antihypertensive agents -Heart  healthy diet discussed with patient.   Family Communication:   Family at bedside updated 11/1   Consultants:  neurology, CIR   Code Status:  FULL    DVT Prophylaxis:  [place] Heparin     Procedures: As Listed in Progress Note Above   Antibiotics: None      Subjective: Patient states vision is unchanged, but no worse.  Feels that right arm and leg strength  are slolwy improving.  Denies f/c,cp, sob, n/v/d  Objective: Vitals:   04/05/22 1551 04/05/22 2030 04/06/22 0452 04/06/22 1300  BP: (!) 158/81 (!) 149/79 (!) 150/90 (!) 152/80  Pulse: 88 76 77 82  Resp: 18 14    Temp: 98.7 F (37.1 C) 98.9 F (37.2 C) 98.4 F (36.9 C) 98.3 F (36.8 C)  TempSrc: Oral Oral Oral Oral  SpO2: 96% 98% 98% 100%  Weight:      Height:        Intake/Output Summary (Last 24 hours) at 04/06/2022 1723 Last data filed at 04/06/2022 1300 Gross per 24 hour  Intake 600 ml  Output 450 ml  Net 150 ml   Weight change:  Exam:  General:  Pt is alert, follows commands appropriately, not in acute distress HEENT: No icterus, No thrush, No neck mass, [place]/AT Cardiovascular: RRR, S1/S2, no rubs, no gallops Respiratory: CTA bilaterally, no wheezing, no crackles, no rhonchi Abdomen: Soft/+BS, non tender, non distended, no guarding Extremities: No edema, No lymphangitis, No petechiae, No rashes, no synovitis   Data Reviewed: I have personally reviewed following labs and imaging studies Basic Metabolic Panel: Recent Labs  Lab 04/02/22 1901 04/03/22 0615 04/05/22 0407  NA 142 144 139  K 3.2* 3.8 3.6  CL 110 112* 108  CO2 '23 23 24  '$ GLUCOSE 105* 89 108*  BUN 37* 33* 30*  CREATININE 1.61* 1.42* 1.50*  CALCIUM 8.7* 8.4* 8.3*   Liver Function Tests: Recent Labs  Lab 04/02/22 1901 04/03/22 0615  AST 29 24  ALT 19 17  ALKPHOS 66 58  BILITOT 1.1 1.0  PROT 6.5 5.7*  ALBUMIN 3.8 3.1*   No results for input(s): "LIPASE", "AMYLASE" in the last 168 hours. No results for input(s): "AMMONIA" in the last 168 hours. Coagulation Profile: No results for input(s): "INR", "PROTIME" in the last 168 hours. CBC: Recent Labs  Lab 04/02/22 1901 04/03/22 0615 04/05/22 0407  WBC 11.5* 8.9 8.7  NEUTROABS 9.6*  --   --   HGB 12.7* 11.5* 11.8*  HCT 38.7* 35.4* 36.0*  MCV 95.8 97.3 96.0  PLT 268 233 247   Cardiac Enzymes: Recent Labs  Lab 04/02/22 1901  CKTOTAL  413*   BNP: Invalid input(s): "POCBNP" CBG: No results for input(s): "GLUCAP" in the last 168 hours. HbA1C: No results for input(s): "HGBA1C" in the last 72 hours. Urine analysis:    Component Value Date/Time   COLORURINE YELLOW 04/02/2022 1857   APPEARANCEUR CLEAR 04/02/2022 1857   LABSPEC 1.011 04/02/2022 1857   PHURINE 7.0 04/02/2022 1857   GLUCOSEU NEGATIVE 04/02/2022 1857   HGBUR SMALL (A) 04/02/2022 1857   BILIRUBINUR NEGATIVE 04/02/2022 1857   KETONESUR 20 (A) 04/02/2022 1857   PROTEINUR 30 (A) 04/02/2022 1857   UROBILINOGEN 1.0 08/26/2007 1932   NITRITE NEGATIVE 04/02/2022 1857   LEUKOCYTESUR NEGATIVE 04/02/2022 1857   Sepsis Labs: '@LABRCNTIP'$ (procalcitonin:4,lacticidven:4) ) Recent Results (from the past 240 hour(s))  Urine Culture     Status: None   Collection Time: 04/02/22  6:57 PM   Specimen: In/Out Cath Urine  Result Value Ref Range Status   Specimen Description   Final    IN/OUT CATH URINE Performed at [facility], [address], [place], [place] [address]    Special Requests   Final    NONE Performed at [facility], [address], [place], [place] [address]    Culture   Final    NO GROWTH Performed at [facility], [place] [address], [place], [place] [address]    Report Status 04/04/2022 FINAL  Final     Scheduled Meds:  aspirin EC  81 mg Oral Daily   clopidogrel  75 mg Oral Daily   gabapentin  100 mg Oral QHS   heparin  5,000 Units Subcutaneous Q8H   Continuous Infusions:  Procedures/Studies: EEG adult  Result Date: 2022/04/08 Lora Havens, MD     04-08-22  5:06 PM Patient Name: Adam Arellano MRN: [id number] Epilepsy Attending: Lora Havens Referring Physician/Provider: Lora Havens, MD Date: 04/08/22 Duration: 21.56 mins Patient history: 88yo M with ams. EEG to evaluate for seizure Level of alertness: Awake AEDs during EEG study: GBP Technical aspects: This EEG study was done with scalp electrodes positioned  according to the 10-20 International system of electrode placement. Electrical activity was reviewed with band pass filter of 1-'70Hz'$ , sensitivity of 7 uV/mm, display speed of 50m/sec with a '60Hz'$  notched filter applied as appropriate. EEG data were recorded continuously and digitally stored.  Video monitoring was available and reviewed as appropriate. Description: The posterior dominant rhythm consists of 9-10 Hz activity of moderate voltage (25-35 uV) seen predominantly in posterior head regions, symmetric and reactive to eye opening and eye closing. EEG showed intermittent 2-3 Hz delta slowing In left parietal region. Hyperventilation and photic stimulation were not performed.   ABNORMALITY - Intermittent slow, left parietal region IMPRESSION: This study is suggestive of cortical dysfunction arising from left parietal region likely secondary to underlying stroke. No seizures or epileptiform discharges were seen throughout the recording. PLora Havens  ECHOCARDIOGRAM COMPLETE  Result Date: 104-Nov-2023   ECHOCARDIOGRAM REPORT   Patient Name:   Adam MCKINNONDate of Exam: 12023-11-04Medical Rec #:  [id number]        Height:       67.0 in Accession #:    [id number]       Weight:       160.9 lb Date of Birth:  3Oct 03, 1933         BSA:          1.844 m Patient Age:    89years          BP:           171/78 mmHg Patient Gender: M                 HR:           75 bpm. Exam [facility]:  [facility]Procedure: 2D Echo, Cardiac Doppler and Color Doppler Indications:    Stroke  History:        Patient has prior history of Echocardiogram examinations, most                 recent 11/24/2021. CAD and Previous Myocardial Infarction,                 Stroke, Arrythmias:PVC, Signs/Symptoms:Fatigue; Risk                 Factors:Hypertension and Dyslipidemia. Mitraclip implantation  08/19/20.                  Mitral Valve: Mitra-Clip valve is present in the mitral                 position.  Sonographer:     Wenda Low Referring Phys: [id number] ASIA B Albin  1. Left ventricular ejection fraction, by estimation, is 55 to 60%. The left ventricle has normal function. The left ventricle has no regional wall motion abnormalities. There is mild left ventricular hypertrophy. Left ventricular diastolic parameters are consistent with Grade I diastolic dysfunction (impaired relaxation).  2. Right ventricular systolic function is normal. The right ventricular size is normal. There is mildly elevated pulmonary artery systolic pressure. The estimated right ventricular systolic pressure is 29.5 mmHg.  3. Left atrial size was mildly dilated.  4. The mitral valve has been repaired/replaced. Mild mitral valve regurgitation. The mean mitral valve gradient is 3.0 mmHg. There is a Mitra-Clip present in the mitral position.  5. Tricuspid valve regurgitation is mild to moderate.  6. The aortic valve is tricuspid. Aortic valve regurgitation is not visualized. Aortic valve sclerosis/calcification is present, without any evidence of aortic stenosis. Aortic valve mean gradient measures 5.5 mmHg.  7. The inferior vena cava is normal in size with greater than 50% respiratory variability, suggesting right atrial pressure of 3 mmHg. Comparison(s): Prior images reviewed side by side. LVEF remains normal at 55-60%. MitraClip in position with mild posterior leaflet prolapse, mild mitral regurgitation and stable mean gradient. FINDINGS  Left Ventricle: Left ventricular ejection fraction, by estimation, is 55 to 60%. The left ventricle has normal function. The left ventricle has no regional wall motion abnormalities. The left ventricular internal cavity size was normal in size. There is  mild left ventricular hypertrophy. Left ventricular diastolic parameters are consistent with Grade I diastolic dysfunction (impaired relaxation). Right Ventricle: The right ventricular size is normal. No increase in right ventricular wall  thickness. Right ventricular systolic function is normal. There is mildly elevated pulmonary artery systolic pressure. The tricuspid regurgitant velocity is 3.05  m/s, and with an assumed right atrial pressure of 3 mmHg, the estimated right ventricular systolic pressure is 62.1 mmHg. Left Atrium: Left atrial size was mildly dilated. Right Atrium: Right atrial size was normal in size. Pericardium: There is no evidence of pericardial effusion. Presence of epicardial fat layer. Mitral Valve: The mitral valve has been repaired/replaced. Mild mitral valve regurgitation. There is a Mitra-Clip present in the mitral position. MV peak gradient, 9.6 mmHg. The mean mitral valve gradient is 3.0 mmHg. Tricuspid Valve: The tricuspid valve is grossly normal. Tricuspid valve regurgitation is mild to moderate. Aortic Valve: The aortic valve is tricuspid. There is mild to moderate aortic valve annular calcification. Aortic valve regurgitation is not visualized. Aortic valve sclerosis/calcification is present, without any evidence of aortic stenosis. Aortic valve mean gradient measures 5.5 mmHg. Aortic valve peak gradient measures 11.1 mmHg. Aortic valve area, by VTI measures 1.74 cm. Pulmonic Valve: The pulmonic valve was grossly normal. Pulmonic valve regurgitation is trivial. Aorta: The aortic root is normal in size and structure. Venous: The inferior vena cava is normal in size with greater than 50% respiratory variability, suggesting right atrial pressure of 3 mmHg. IAS/Shunts: No atrial level shunt detected by color flow Doppler.  LEFT VENTRICLE PLAX 2D LVIDd:         4.80 cm   Diastology LVIDs:         3.40 cm  LV e' medial:    5.22 cm/s LV PW:         1.20 cm   LV E/e' medial:  14.5 LV IVS:        1.10 cm   LV e' lateral:   12.80 cm/s LVOT diam:     2.00 cm   LV E/e' lateral: 5.9 LV SV:         63 LV SV Index:   34 LVOT Area:     3.14 cm  RIGHT VENTRICLE RV Basal diam:  3.30 cm RV Mid diam:    2.60 cm RV S prime:     12.10  cm/s TAPSE (M-mode): 2.4 cm LEFT ATRIUM             Index        RIGHT ATRIUM           Index LA diam:        4.50 cm 2.44 cm/m   RA Area:     19.10 cm LA Vol (A2C):   62.7 ml 34.01 ml/m  RA Volume:   44.60 ml  24.19 ml/m LA Vol (A4C):   64.2 ml 34.82 ml/m LA Biplane Vol: 64.7 ml 35.09 ml/m  AORTIC VALVE                     PULMONIC VALVE AV Area (Vmax):    1.65 cm      PV Vmax:       0.88 m/s AV Area (Vmean):   1.58 cm      PV Peak grad:  3.1 mmHg AV Area (VTI):     1.74 cm AV Vmax:           166.50 cm/s AV Vmean:          113.000 cm/s AV VTI:            0.362 m AV Peak Grad:      11.1 mmHg AV Mean Grad:      5.5 mmHg LVOT Vmax:         87.50 cm/s LVOT Vmean:        56.900 cm/s LVOT VTI:          0.200 m LVOT/AV VTI ratio: 0.55  AORTA Ao Root diam: 3.60 cm MITRAL VALVE                TRICUSPID VALVE MV Area (PHT): 1.91 cm     TR Peak grad:   37.2 mmHg MV Area VTI:   1.92 cm     TR Vmax:        305.00 cm/s MV Peak grad:  9.6 mmHg MV Mean grad:  3.0 mmHg     SHUNTS MV Vmax:       1.55 m/s     Systemic VTI:  0.20 m MV Vmean:      74.5 cm/s    Systemic Diam: 2.00 cm MV Decel Time: 397 msec MV E velocity: 75.80 cm/s MV A velocity: 126.00 cm/s MV E/A ratio:  0.60 Rozann Lesches MD Electronically signed by Rozann Lesches MD Signature Date/Time: 04/03/2022/1:31:51 PM    Final    US Carotid Bilateral (at [facility] and [facility] only)  Result Date: 04/03/2022 CLINICAL DATA:  88 year old male with syncope and altered mental status EXAM: BILATERAL CAROTID DUPLEX ULTRASOUND TECHNIQUE: Pearline Cables scale imaging, color Doppler and duplex ultrasound were performed of bilateral carotid and vertebral arteries in the neck. COMPARISON:  None Available. FINDINGS: Criteria: Quantification of carotid  stenosis is based on velocity parameters that correlate the residual internal carotid diameter with NASCET-based stenosis levels, using the diameter of the distal internal carotid lumen as the denominator for stenosis measurement. The  following velocity measurements were obtained: RIGHT ICA:  Systolic 626 cm/sec, Diastolic 32 cm/sec CCA:  99 cm/sec SYSTOLIC ICA/CCA RATIO:  1.7 ECA:  119 cm/sec LEFT ICA:  Systolic 78 cm/sec, Diastolic 15 cm/sec CCA:  948 cm/sec SYSTOLIC ICA/CCA RATIO:  0.5 ECA:  109 cm/sec Right Brachial SBP: Not acquired Left Brachial SBP: Not acquired RIGHT CAROTID ARTERY: No significant calcifications of the right common carotid artery. Intermediate waveform maintained. Heterogeneous and partially calcified plaque at the right carotid bifurcation. No significant lumen shadowing. Low resistance waveform of the right ICA. No significant tortuosity. RIGHT VERTEBRAL ARTERY: Antegrade flow with low resistance waveform. LEFT CAROTID ARTERY: No significant calcifications of the left common carotid artery. Intermediate waveform maintained. Heterogeneous and partially calcified plaque at the left carotid bifurcation without significant lumen shadowing. Low resistance waveform of the left ICA. No significant tortuosity. LEFT VERTEBRAL ARTERY:  Antegrade flow with low resistance waveform. IMPRESSION: Right: Heterogeneous and partially calcified plaque at the right carotid bifurcation, with discordant results regarding degree of stenosis by established duplex criteria. Peak velocity suggests 50%-69% stenosis, with the ICA/ CCA ratio suggesting a lesser degree of stenosis. If establishing a more accurate degree of stenosis is required, cerebral angiogram should be considered, or as a second best test, CTA. Left: Color duplex indicates minimal heterogeneous and calcified plaque, with no hemodynamically significant stenosis by duplex criteria in the extracranial cerebrovascular circulation. Signed, Dulcy Fanny. Nadene Rubins, RPVI [facility] Electronically Signed   By: Corrie Mckusick D.O.   On: 04/03/2022 09:40   MR BRAIN WO CONTRAST  Result Date: 04/03/2022 CLINICAL DATA:   Stroke, follow up EXAM: MRI HEAD WITHOUT CONTRAST MRA HEAD WITHOUT CONTRAST TECHNIQUE: Multiplanar, multi-echo pulse sequences of the brain and surrounding structures were acquired without intravenous contrast. Angiographic images of the Circle of Willis were acquired using MRA technique without intravenous contrast. COMPARISON:  CT head April 02, 2022 FINDINGS: MRI HEAD FINDINGS Brain: Acute left PCA territory infarct, including restricted diffusion in the left occipital lobe as well as the left thalamocapsular junction. Edema without mass effect. Additional mild for age scattered T2/FLAIR hyperintensities in the white matter, nonspecific but compatible with chronic microvascular ischemic disease. No evidence of acute hemorrhage, mass lesion, midline shift or hydrocephalus. Vascular: Detailed below. Skull and upper cervical spine: Normal marrow signal. Sinuses/Orbits: Clear sinuses.  No acute orbital findings. Other: Trace left mastoid effusion. MRA HEAD FINDINGS Motion limited study. Anterior circulation: Bilateral intracranial ICAs, MCAs, and ACAs are patent. Potentially severe right prox A2 ACA stenosis. Approximately 3 mm outpouching arising from the left MCA bifurcation, concerning for aneurysm. Posterior circulation: Bilateral visualized intradural vertebral arteries and basilar artery are patent without significant stenosis. Right PCAs patent without proximal high-grade stenosis. Occlusion (favored) versus severe stenosis of the left P2 PCA. IMPRESSION: MRI: Acute left PCA territory infarct. MRA: 1. Occlusion (favored) versus severe stenosis of the left P2 PCA. 2. Potentially severe proximal right A2 ACA stenosis. 3. Approximately 3 mm outpouching arising from the left MCA bifurcation, concerning for aneurysm. Recommend CTA to confirm and further characterize. Electronically Signed   By: Margaretha Sheffield M.D.   On: 04/03/2022 08:58   MR ANGIO HEAD WO CONTRAST  Result Date: 04/03/2022 CLINICAL DATA:   Stroke, follow up EXAM: MRI HEAD WITHOUT  CONTRAST MRA HEAD WITHOUT CONTRAST TECHNIQUE: Multiplanar, multi-echo pulse sequences of the brain and surrounding structures were acquired without intravenous contrast. Angiographic images of the Circle of Willis were acquired using MRA technique without intravenous contrast. COMPARISON:  CT head April 02, 2022 FINDINGS: MRI HEAD FINDINGS Brain: Acute left PCA territory infarct, including restricted diffusion in the left occipital lobe as well as the left thalamocapsular junction. Edema without mass effect. Additional mild for age scattered T2/FLAIR hyperintensities in the white matter, nonspecific but compatible with chronic microvascular ischemic disease. No evidence of acute hemorrhage, mass lesion, midline shift or hydrocephalus. Vascular: Detailed below. Skull and upper cervical spine: Normal marrow signal. Sinuses/Orbits: Clear sinuses.  No acute orbital findings. Other: Trace left mastoid effusion. MRA HEAD FINDINGS Motion limited study. Anterior circulation: Bilateral intracranial ICAs, MCAs, and ACAs are patent. Potentially severe right prox A2 ACA stenosis. Approximately 3 mm outpouching arising from the left MCA bifurcation, concerning for aneurysm. Posterior circulation: Bilateral visualized intradural vertebral arteries and basilar artery are patent without significant stenosis. Right PCAs patent without proximal high-grade stenosis. Occlusion (favored) versus severe stenosis of the left P2 PCA. IMPRESSION: MRI: Acute left PCA territory infarct. MRA: 1. Occlusion (favored) versus severe stenosis of the left P2 PCA. 2. Potentially severe proximal right A2 ACA stenosis. 3. Approximately 3 mm outpouching arising from the left MCA bifurcation, concerning for aneurysm. Recommend CTA to confirm and further characterize. Electronically Signed   By: Margaretha Sheffield M.D.   On: 04/03/2022 08:58   CT Head Wo Contrast  Result Date: 04/02/2022 CLINICAL DATA:   Altered mental status with trauma to the head, face, and cervical spine. The patient is reportedly found normal approximately 24 hours ago. EXAM: CT HEAD WITHOUT CONTRAST CT MAXILLOFACIAL WITHOUT CONTRAST CT CERVICAL SPINE WITHOUT CONTRAST TECHNIQUE: Multidetector CT imaging of the head, cervical spine, and maxillofacial structures were performed using the standard protocol without intravenous contrast. Multiplanar CT image reconstructions of the cervical spine and maxillofacial structures were also generated. RADIATION DOSE REDUCTION: This exam was performed according to the departmental dose-optimization program which includes automated exposure control, adjustment of the mA and/or kV according to patient size and/or use of iterative reconstruction technique. COMPARISON:  CT head dated 08/26/2007. FINDINGS: CT HEAD FINDINGS Brain: There is loss of the gray-white matter differentiation in a moderate volume region of the left temporal lobe and occipital lobe in the left posterior cerebral artery territory. There is no significant swelling of the parenchyma or associated mass effect, suggestive of a hyperacute to acute infarct. No acute intra or extra-axial fluid collection is identified. There is mild cerebral volume loss with associated ex vacuo dilatation. There is no midline shift and the basilar cisterns are patent. Periventricular white matter hypoattenuation likely represents chronic small vessel ischemic disease. Vascular: There are vascular calcifications in the carotid siphons. Skull: Normal. Negative for fracture or focal lesion. Other: Soft tissue density in the left middle ear likely reflects the patient's known cholesteatoma. CT MAXILLOFACIAL FINDINGS Osseous: No fracture or mandibular dislocation. No destructive process. Orbits: Negative. No traumatic or inflammatory finding. Sinuses: There is right maxillary sinus disease. Soft tissues: Negative. CT CERVICAL SPINE FINDINGS Alignment: There is chronic  appearing anterolisthesis of C2 on C3 C3 on C4, each measuring 3 mm. There is 2 mm retrolisthesis of C4 on C5. No acute traumatic listhesis is identified. Skull base and vertebrae: No acute fracture. No primary bone lesion or focal pathologic process. Soft tissues and spinal canal: No prevertebral fluid or swelling. No visible  canal hematoma. Disc levels: Up to severe multilevel degenerative disc and joint disease. Upper chest: Negative. Other: None. IMPRESSION: 1. Moderate volume hyperacute to acute infarct in the left posterior cerebral artery territory without significant associated mass effect. 2. No acute facial bone fracture. 3. No acute osseous injury in the cervical spine. These results were called by telephone at the time of interpretation on 04/02/2022 at 8:27 pm to provider [facility] ZAMMIT , who verbally acknowledged these results. Electronically Signed   By: Zerita Boers M.D.   On: 04/02/2022 20:31   CT Cervical Spine Wo Contrast  Result Date: 04/02/2022 CLINICAL DATA:  Altered mental status with trauma to the head, face, and cervical spine. The patient is reportedly found normal approximately 24 hours ago. EXAM: CT HEAD WITHOUT CONTRAST CT MAXILLOFACIAL WITHOUT CONTRAST CT CERVICAL SPINE WITHOUT CONTRAST TECHNIQUE: Multidetector CT imaging of the head, cervical spine, and maxillofacial structures were performed using the standard protocol without intravenous contrast. Multiplanar CT image reconstructions of the cervical spine and maxillofacial structures were also generated. RADIATION DOSE REDUCTION: This exam was performed according to the departmental dose-optimization program which includes automated exposure control, adjustment of the mA and/or kV according to patient size and/or use of iterative reconstruction technique. COMPARISON:  CT head dated 08/26/2007. FINDINGS: CT HEAD FINDINGS Brain: There is loss of the gray-white matter differentiation in a moderate volume region of the left temporal  lobe and occipital lobe in the left posterior cerebral artery territory. There is no significant swelling of the parenchyma or associated mass effect, suggestive of a hyperacute to acute infarct. No acute intra or extra-axial fluid collection is identified. There is mild cerebral volume loss with associated ex vacuo dilatation. There is no midline shift and the basilar cisterns are patent. Periventricular white matter hypoattenuation likely represents chronic small vessel ischemic disease. Vascular: There are vascular calcifications in the carotid siphons. Skull: Normal. Negative for fracture or focal lesion. Other: Soft tissue density in the left middle ear likely reflects the patient's known cholesteatoma. CT MAXILLOFACIAL FINDINGS Osseous: No fracture or mandibular dislocation. No destructive process. Orbits: Negative. No traumatic or inflammatory finding. Sinuses: There is right maxillary sinus disease. Soft tissues: Negative. CT CERVICAL SPINE FINDINGS Alignment: There is chronic appearing anterolisthesis of C2 on C3 C3 on C4, each measuring 3 mm. There is 2 mm retrolisthesis of C4 on C5. No acute traumatic listhesis is identified. Skull base and vertebrae: No acute fracture. No primary bone lesion or focal pathologic process. Soft tissues and spinal canal: No prevertebral fluid or swelling. No visible canal hematoma. Disc levels: Up to severe multilevel degenerative disc and joint disease. Upper chest: Negative. Other: None. IMPRESSION: 1. Moderate volume hyperacute to acute infarct in the left posterior cerebral artery territory without significant associated mass effect. 2. No acute facial bone fracture. 3. No acute osseous injury in the cervical spine. These results were called by telephone at the time of interpretation on 04/02/2022 at 8:27 pm to provider [facility] ZAMMIT , who verbally acknowledged these results. Electronically Signed   By: Zerita Boers M.D.   On: 04/02/2022 20:31   CT Maxillofacial Wo  Contrast  Result Date: 04/02/2022 CLINICAL DATA:  Altered mental status with trauma to the head, face, and cervical spine. The patient is reportedly found normal approximately 24 hours ago. EXAM: CT HEAD WITHOUT CONTRAST CT MAXILLOFACIAL WITHOUT CONTRAST CT CERVICAL SPINE WITHOUT CONTRAST TECHNIQUE: Multidetector CT imaging of the head, cervical spine, and maxillofacial structures were performed using the standard protocol without intravenous  contrast. Multiplanar CT image reconstructions of the cervical spine and maxillofacial structures were also generated. RADIATION DOSE REDUCTION: This exam was performed according to the departmental dose-optimization program which includes automated exposure control, adjustment of the mA and/or kV according to patient size and/or use of iterative reconstruction technique. COMPARISON:  CT head dated 08/26/2007. FINDINGS: CT HEAD FINDINGS Brain: There is loss of the gray-white matter differentiation in a moderate volume region of the left temporal lobe and occipital lobe in the left posterior cerebral artery territory. There is no significant swelling of the parenchyma or associated mass effect, suggestive of a hyperacute to acute infarct. No acute intra or extra-axial fluid collection is identified. There is mild cerebral volume loss with associated ex vacuo dilatation. There is no midline shift and the basilar cisterns are patent. Periventricular white matter hypoattenuation likely represents chronic small vessel ischemic disease. Vascular: There are vascular calcifications in the carotid siphons. Skull: Normal. Negative for fracture or focal lesion. Other: Soft tissue density in the left middle ear likely reflects the patient's known cholesteatoma. CT MAXILLOFACIAL FINDINGS Osseous: No fracture or mandibular dislocation. No destructive process. Orbits: Negative. No traumatic or inflammatory finding. Sinuses: There is right maxillary sinus disease. Soft tissues: Negative. CT  CERVICAL SPINE FINDINGS Alignment: There is chronic appearing anterolisthesis of C2 on C3 C3 on C4, each measuring 3 mm. There is 2 mm retrolisthesis of C4 on C5. No acute traumatic listhesis is identified. Skull base and vertebrae: No acute fracture. No primary bone lesion or focal pathologic process. Soft tissues and spinal canal: No prevertebral fluid or swelling. No visible canal hematoma. Disc levels: Up to severe multilevel degenerative disc and joint disease. Upper chest: Negative. Other: None. IMPRESSION: 1. Moderate volume hyperacute to acute infarct in the left posterior cerebral artery territory without significant associated mass effect. 2. No acute facial bone fracture. 3. No acute osseous injury in the cervical spine. These results were called by telephone at the time of interpretation on 04/02/2022 at 8:27 pm to provider [facility] ZAMMIT , who verbally acknowledged these results. Electronically Signed   By: Zerita Boers M.D.   On: 04/02/2022 20:31   DG Chest Port 1 View  Result Date: 04/02/2022 CLINICAL DATA:  Weakness. EXAM: PORTABLE CHEST 1 VIEW COMPARISON:  11/02/2021 FINDINGS: 1913 hours. Lungs are hyperexpanded. The cardio pericardial silhouette is enlarged. Interstitial markings are diffusely coarsened with chronic features. The lungs are clear without focal pneumonia, edema, pneumothorax or pleural effusion. Telemetry leads overlie the chest. IMPRESSION: Emphysema and cardiomegaly without acute cardiopulmonary findings. Electronically Signed   By: Misty Stanley M.D.   On: 04/02/2022 19:48   DG Pelvis Portable  Result Date: 04/02/2022 CLINICAL DATA:  Weakness. EXAM: PORTABLE PELVIS 1-2 VIEWS COMPARISON:  None Available. FINDINGS: No acute fracture or dislocation. The bones are osteopenic. Degenerative changes of the lower lumbar spine. Multiple surgical clips over the pelvis. The soft tissues are grossly unremarkable. IMPRESSION: No acute fracture or dislocation. Osteopenia.  Electronically Signed   By: Anner Crete M.D.   On: 04/02/2022 19:47    Orson Eva, DO  [facility]  If 7PM-7AM, please contact night-coverage [email or web address] Password [email or web address] 04/06/2022, 5:23 PM   LOS: 3 days

## 2022-04-06 NOTE — Progress Notes (Signed)
Inpatient Rehab Admissions Coordinator:  Await insurance authorization. Will continue to follow.   Gayland Curry, [place], [place] Admissions Coordinator [phone]

## 2022-04-06 NOTE — Progress Notes (Signed)
Occupational Therapy Treatment Patient Details Name: Adam Arellano MRN: [id number] DOB: 02-17-1932 Today's Date: 04/06/2022   History of present illness 88 y.o. M admitted on 04/02/22 due to being found at home lying in bed covered in bruises, bleeding, and with urine incontinence. CT positive for an acute stroke in the L posterior cerbral artery territory. PMH significant for  transient global amnesia in 2009, hypertension, hyperlipidemia, coronary artery disease with NSTEMI status post multiple PCI's, prostate cancer status post radical prostatectomy, severe mitral regurgitation status post TEER ( 08/19/2020).   OT comments  Pt agreeable to OT treatment today. Pt able to complete B UE strengthening via AA/ROM using gripper. Pt needing tactile cuing for horizontal abduction. Generally WFL AA/ROM observed. Pt still unsteady in standing with poor coordination and impulsive actions with RW. Pt was able to was hands standing at the sink with cuing to locate hand towels located to R side. Pt left in chair with chair alarm set and call bell within reach. Pt will benefit from continued OT in the hospital and recommended venue below to increase strength, balance, and endurance for safe ADL's.      Recommendations for follow up therapy are one component of a multi-disciplinary discharge planning process, led by the attending physician.  Recommendations may be updated based on patient status, additional functional criteria and insurance authorization.    Follow Up Recommendations  Acute inpatient rehab (3hours/day)    Assistance Recommended at Discharge Frequent or constant Supervision/Assistance  Patient can return home with the following  A lot of help with walking and/or transfers;A lot of help with bathing/dressing/bathroom;Assistance with cooking/housework;Direct supervision/assist for medications management;Assistance with feeding;Direct supervision/assist for financial management;Assist for  transportation;Help with stairs or ramp for entrance   Equipment Recommendations  BSC/3in1    Recommendations for Other Services Rehab consult    Precautions / Restrictions Precautions Precautions: Fall Restrictions Weight Bearing Restrictions: No       Mobility Bed Mobility                    Transfers Overall transfer level: Needs assistance Equipment used: Rolling walker (2 wheels) Transfers: Sit to/from Stand, Bed to chair/wheelchair/BSC Sit to Stand: Min guard, Min assist     Step pivot transfers: Mod assist     General transfer comment: Poor coordination; impulsive     Balance Overall balance assessment: Needs assistance Sitting-balance support: Feet supported, No upper extremity supported Sitting balance-Leahy Scale: Good Sitting balance - Comments: seated in recliner   Standing balance support: Reliant on assistive device for balance, During functional activity, Bilateral upper extremity supported Standing balance-Leahy Scale: Poor Standing balance comment: fair/poor using RW                           ADL either performed or assessed with clinical judgement   ADL       Grooming: Min guard;Standing;Wash/dry hands Grooming Details (indicate cue type and reason): Standing at sink with RW available. Verbal cuing to locate paper towel to R side.                 Toilet Transfer: Moderate assistance;Ambulation;Rolling walker (2 wheels) Toilet Transfer Details (indicate cue type and reason): Pt still impulsive and unsteady with RW needing phsyical assist to keep walker clost to pt's body rather than pushed out in front.         Functional mobility during ADLs: Moderate assistance;Rolling walker (2 wheels) General ADL Comments: Able  to ambulate in room with RW.      Cognition Arousal/Alertness: Awake/alert Behavior During Therapy: WFL for tasks assessed/performed, Impulsive Overall Cognitive Status: Within Functional Limits for  tasks assessed                                          Exercises General Exercises - Upper Extremity Shoulder Flexion: AAROM, 10 reps, Both, Seated (x10 protraction) Shoulder ABduction: Right, AAROM, 10 reps, Seated (x10 external rotation) Shoulder Horizontal ABduction: AAROM, Both, 10 reps, Seated (Mod A needed to keep shoulders flexed near 90*.)                 Pertinent Vitals/ Pain       Pain Assessment Pain Assessment: No/denies pain                                                          Frequency  Min 2X/week        Progress Toward Goals  OT Goals(current goals can now be found in the care plan section)  Progress towards OT goals: Progressing toward goals  Acute Rehab OT Goals Patient Stated Goal: To get his independence back OT Goal Formulation: With patient/family Time For Goal Achievement: 04/17/22 Potential to Achieve Goals: Good ADL Goals Pt Will Perform Grooming: with set-up;sitting Pt Will Perform Lower Body Bathing: with min assist;sitting/lateral leans;sit to/from stand Pt Will Perform Lower Body Dressing: with min assist;sitting/lateral leans;sit to/from stand Pt Will Transfer to Toilet: with min assist;ambulating Pt Will Perform Toileting - Clothing Manipulation and hygiene: with min assist;sit to/from stand;sitting/lateral leans  Plan Discharge plan remains appropriate                                    End of Session Equipment Utilized During Treatment: Gait belt;Rolling walker (2 wheels)  OT Visit Diagnosis: Unsteadiness on feet (R26.81);Other abnormalities of gait and mobility (R26.89);Muscle weakness (generalized) (M62.81);Hemiplegia and hemiparesis Hemiplegia - Right/Left: Right Hemiplegia - dominant/non-dominant: Dominant Hemiplegia - caused by: Cerebral infarction   Activity Tolerance Patient tolerated treatment well   Patient Left in chair;with call bell/phone within  reach;with chair alarm set   Nurse Communication          Time: 7939-0300 OT Time Calculation (min): 16 min  Charges: OT General Charges $OT Visit: 1 Visit OT Treatments $Therapeutic Exercise: 8-22 mins  Guillaume Weninger OT, MOT  Larey Seat 04/06/2022, 9:49 AM

## 2022-04-07 ENCOUNTER — Other Ambulatory Visit: Payer: Self-pay

## 2022-04-07 ENCOUNTER — Encounter (HOSPITAL_COMMUNITY): Payer: Self-pay | Admitting: Physical Medicine & Rehabilitation

## 2022-04-07 ENCOUNTER — Inpatient Hospital Stay (HOSPITAL_COMMUNITY)
Admission: RE | Admit: 2022-04-07 | Discharge: 2022-04-21 | DRG: 057 | Disposition: A | Discharge status: Home / Self Care | Payer: Medicare Other | Source: Other Acute Inpatient Hospital | Attending: Physical Medicine & Rehabilitation | Admitting: Physical Medicine & Rehabilitation

## 2022-04-07 DIAGNOSIS — I69320 Aphasia following cerebral infarction: Secondary | ICD-10-CM

## 2022-04-07 DIAGNOSIS — Z7982 Long term (current) use of aspirin: Secondary | ICD-10-CM

## 2022-04-07 DIAGNOSIS — Z741 Need for assistance with personal care: Secondary | ICD-10-CM | POA: Diagnosis present

## 2022-04-07 DIAGNOSIS — I2510 Atherosclerotic heart disease of native coronary artery without angina pectoris: Secondary | ICD-10-CM | POA: Diagnosis not present

## 2022-04-07 DIAGNOSIS — I63532 Cerebral infarction due to unspecified occlusion or stenosis of left posterior cerebral artery: Secondary | ICD-10-CM | POA: Diagnosis not present

## 2022-04-07 DIAGNOSIS — I252 Old myocardial infarction: Secondary | ICD-10-CM

## 2022-04-07 DIAGNOSIS — I129 Hypertensive chronic kidney disease with stage 1 through stage 4 chronic kidney disease, or unspecified chronic kidney disease: Secondary | ICD-10-CM | POA: Diagnosis not present

## 2022-04-07 DIAGNOSIS — K219 Gastro-esophageal reflux disease without esophagitis: Secondary | ICD-10-CM | POA: Diagnosis present

## 2022-04-07 DIAGNOSIS — I10 Essential (primary) hypertension: Secondary | ICD-10-CM | POA: Diagnosis not present

## 2022-04-07 DIAGNOSIS — Z888 Allergy status to other drugs, medicaments and biological substances status: Secondary | ICD-10-CM

## 2022-04-07 DIAGNOSIS — E785 Hyperlipidemia, unspecified: Secondary | ICD-10-CM | POA: Diagnosis not present

## 2022-04-07 DIAGNOSIS — I739 Peripheral vascular disease, unspecified: Secondary | ICD-10-CM | POA: Diagnosis present

## 2022-04-07 DIAGNOSIS — Z79899 Other long term (current) drug therapy: Secondary | ICD-10-CM | POA: Diagnosis not present

## 2022-04-07 DIAGNOSIS — R4701 Aphasia: Secondary | ICD-10-CM | POA: Diagnosis present

## 2022-04-07 DIAGNOSIS — N1832 Chronic kidney disease, stage 3b: Secondary | ICD-10-CM | POA: Diagnosis not present

## 2022-04-07 DIAGNOSIS — Z7902 Long term (current) use of antithrombotics/antiplatelets: Secondary | ICD-10-CM | POA: Diagnosis not present

## 2022-04-07 DIAGNOSIS — Z87891 Personal history of nicotine dependence: Secondary | ICD-10-CM

## 2022-04-07 DIAGNOSIS — I25810 Atherosclerosis of coronary artery bypass graft(s) without angina pectoris: Secondary | ICD-10-CM | POA: Diagnosis not present

## 2022-04-07 DIAGNOSIS — Z9079 Acquired absence of other genital organ(s): Secondary | ICD-10-CM | POA: Diagnosis not present

## 2022-04-07 DIAGNOSIS — I340 Nonrheumatic mitral (valve) insufficiency: Secondary | ICD-10-CM | POA: Diagnosis not present

## 2022-04-07 DIAGNOSIS — J45909 Unspecified asthma, uncomplicated: Secondary | ICD-10-CM | POA: Diagnosis not present

## 2022-04-07 DIAGNOSIS — H532 Diplopia: Secondary | ICD-10-CM | POA: Diagnosis present

## 2022-04-07 DIAGNOSIS — Z8546 Personal history of malignant neoplasm of prostate: Secondary | ICD-10-CM

## 2022-04-07 DIAGNOSIS — Z955 Presence of coronary angioplasty implant and graft: Secondary | ICD-10-CM

## 2022-04-07 DIAGNOSIS — K5900 Constipation, unspecified: Secondary | ICD-10-CM | POA: Diagnosis not present

## 2022-04-07 DIAGNOSIS — I639 Cerebral infarction, unspecified: Secondary | ICD-10-CM | POA: Diagnosis not present

## 2022-04-07 DIAGNOSIS — M79662 Pain in left lower leg: Secondary | ICD-10-CM | POA: Diagnosis not present

## 2022-04-07 DIAGNOSIS — I69351 Hemiplegia and hemiparesis following cerebral infarction affecting right dominant side: Principal | ICD-10-CM

## 2022-04-07 DIAGNOSIS — C61 Malignant neoplasm of prostate: Secondary | ICD-10-CM | POA: Diagnosis not present

## 2022-04-07 DIAGNOSIS — M79661 Pain in right lower leg: Secondary | ICD-10-CM | POA: Diagnosis not present

## 2022-04-07 HISTORY — DX: Cerebral infarction due to unspecified occlusion or stenosis of left posterior cerebral artery: I63.532

## 2022-04-07 LAB — COMPREHENSIVE METABOLIC PANEL
ALT: 16 U/L (ref 0–44)
AST: 19 U/L (ref 15–41)
Albumin: 3 g/dL — ABNORMAL LOW (ref 3.5–5.0)
Alkaline Phosphatase: 55 U/L (ref 38–126)
Anion gap: 9 (ref 5–15)
BUN: 34 mg/dL — ABNORMAL HIGH (ref 8–23)
CO2: 23 mmol/L (ref 22–32)
Calcium: 8.7 mg/dL — ABNORMAL LOW (ref 8.9–10.3)
Chloride: 105 mmol/L (ref 98–111)
Creatinine, Ser: 1.79 mg/dL — ABNORMAL HIGH (ref 0.61–1.24)
GFR, Estimated: 36 mL/min — ABNORMAL LOW (ref >60)
Glucose, Bld: 99 mg/dL (ref 70–99)
Potassium: 4.1 mmol/L (ref 3.5–5.1)
Sodium: 137 mmol/L (ref 135–145)
Total Bilirubin: 0.7 mg/dL (ref 0.3–1.2)
Total Protein: 5.7 g/dL — ABNORMAL LOW (ref 6.5–8.1)

## 2022-04-07 LAB — CBC WITH DIFFERENTIAL/PLATELET
Abs Immature Granulocytes: 0.04 10*3/uL (ref 0.00–0.07)
Basophils Absolute: 0 10*3/uL (ref 0.0–0.1)
Basophils Relative: 0 %
Eosinophils Absolute: 0.1 10*3/uL (ref 0.0–0.5)
Eosinophils Relative: 1 %
HCT: 36.4 % — ABNORMAL LOW (ref 39.0–52.0)
Hemoglobin: 12.1 g/dL — ABNORMAL LOW (ref 13.0–17.0)
Immature Granulocytes: 0 %
Lymphocytes Relative: 15 %
Lymphs Abs: 1.6 10*3/uL (ref 0.7–4.0)
MCH: 31.8 pg (ref 26.0–34.0)
MCHC: 33.2 g/dL (ref 30.0–36.0)
MCV: 95.8 fL (ref 80.0–100.0)
Monocytes Absolute: 0.8 10*3/uL (ref 0.1–1.0)
Monocytes Relative: 8 %
Neutro Abs: 7.7 10*3/uL (ref 1.7–7.7)
Neutrophils Relative %: 76 %
Platelets: 255 10*3/uL (ref 150–400)
RBC: 3.8 MIL/uL — ABNORMAL LOW (ref 4.22–5.81)
RDW: 12.7 % (ref 11.5–15.5)
WBC: 10.2 10*3/uL (ref 4.0–10.5)
nRBC: 0 % (ref 0.0–0.2)

## 2022-04-07 MED ORDER — POLYETHYLENE GLYCOL 3350 17 G PO PACK
17.0000 g | PACK | Freq: Every day | ORAL | Status: DC | PRN
  Administered 2022-04-15 – 2022-04-21 (×2): 17 g via ORAL
  Filled 2022-04-07 (×2): qty 1

## 2022-04-07 MED ORDER — OXYCODONE HCL 5 MG PO TABS: 5.0000 mg | ORAL_TABLET | ORAL | Status: DC | PRN

## 2022-04-07 MED ORDER — GABAPENTIN 100 MG PO CAPS
100.0000 mg | ORAL_CAPSULE | Freq: Every day | ORAL | Status: DC
  Administered 2022-04-07 – 2022-04-20 (×14): 100 mg via ORAL
  Filled 2022-04-07 (×14): qty 1

## 2022-04-07 MED ORDER — NITROGLYCERIN 0.4 MG SL SUBL: 0.4000 mg | SUBLINGUAL_TABLET | SUBLINGUAL | Status: DC | PRN

## 2022-04-07 MED ORDER — POLYVINYL ALCOHOL 1.4 % OP SOLN: 1.0000 [drp] | Freq: Three times a day (TID) | OPHTHALMIC | Status: DC | PRN

## 2022-04-07 MED ORDER — ASPIRIN 81 MG PO TBEC
81.0000 mg | DELAYED_RELEASE_TABLET | Freq: Every day | ORAL | Status: DC
  Administered 2022-04-07 – 2022-04-21 (×15): 81 mg via ORAL
  Filled 2022-04-07 (×15): qty 1

## 2022-04-07 MED ORDER — ENOXAPARIN SODIUM 40 MG/0.4ML IJ SOSY
40.0000 mg | PREFILLED_SYRINGE | INTRAMUSCULAR | Status: DC
  Administered 2022-04-07: 40 mg via SUBCUTANEOUS
  Filled 2022-04-07: qty 0.4

## 2022-04-07 MED ORDER — CLOPIDOGREL BISULFATE 75 MG PO TABS: 75.0000 mg | ORAL_TABLET | Freq: Every day | ORAL | Status: DC

## 2022-04-07 MED ORDER — CLOPIDOGREL BISULFATE 75 MG PO TABS
75.0000 mg | ORAL_TABLET | Freq: Every day | ORAL | Status: DC
  Administered 2022-04-08 – 2022-04-21 (×14): 75 mg via ORAL
  Filled 2022-04-07 (×14): qty 1

## 2022-04-07 MED ORDER — ENOXAPARIN SODIUM 40 MG/0.4ML IJ SOSY: 40.0000 mg | PREFILLED_SYRINGE | INTRAMUSCULAR | Status: DC

## 2022-04-07 MED ORDER — GUAIFENESIN-DM 100-10 MG/5ML PO SYRP
5.0000 mL | ORAL_SOLUTION | Freq: Four times a day (QID) | ORAL | Status: DC | PRN
  Administered 2022-04-12: 10 mL via ORAL
  Filled 2022-04-07: qty 10

## 2022-04-07 MED ORDER — FLEET ENEMA 7-19 GM/118ML RE ENEM: 1.0000 | ENEMA | Freq: Once | RECTAL | Status: DC | PRN

## 2022-04-07 MED ORDER — PROCHLORPERAZINE 25 MG RE SUPP: 12.5000 mg | Freq: Four times a day (QID) | RECTAL | Status: DC | PRN

## 2022-04-07 MED ORDER — BISACODYL 10 MG RE SUPP
10.0000 mg | Freq: Every day | RECTAL | Status: DC | PRN
  Administered 2022-04-20: 10 mg via RECTAL
  Filled 2022-04-07: qty 1

## 2022-04-07 MED ORDER — PROCHLORPERAZINE EDISYLATE 10 MG/2ML IJ SOLN: 5.0000 mg | Freq: Four times a day (QID) | INTRAMUSCULAR | Status: DC | PRN

## 2022-04-07 MED ORDER — TRAZODONE HCL 50 MG PO TABS
25.0000 mg | ORAL_TABLET | Freq: Every evening | ORAL | Status: DC | PRN
  Administered 2022-04-14: 25 mg via ORAL
  Filled 2022-04-07: qty 1

## 2022-04-07 MED ORDER — DIPHENHYDRAMINE HCL 12.5 MG/5ML PO ELIX: 12.5000 mg | ORAL_SOLUTION | Freq: Four times a day (QID) | ORAL | Status: DC | PRN

## 2022-04-07 MED ORDER — ALUM & MAG HYDROXIDE-SIMETH 200-200-20 MG/5ML PO SUSP
30.0000 mL | ORAL | Status: DC | PRN
  Administered 2022-04-16 – 2022-04-18 (×3): 30 mL via ORAL
  Filled 2022-04-07 (×3): qty 30

## 2022-04-07 MED ORDER — ACETAMINOPHEN 325 MG PO TABS: 325.0000 mg | ORAL_TABLET | ORAL | Status: DC | PRN

## 2022-04-07 MED ORDER — PROCHLORPERAZINE MALEATE 5 MG PO TABS: 5.0000 mg | ORAL_TABLET | Freq: Four times a day (QID) | ORAL | Status: DC | PRN

## 2022-04-07 NOTE — H&P (Signed)
Expand All Collapse All      Physical Medicine and Rehabilitation Admission H&P     CC: Functional deficits secondary to acute left posterior cerebral artery infarct   HPI: Adam Arellano is an 88 year old male who was found down at his home my his neighbor on 04/02/2022. He presented to the [facility] ED with right-sided weakness, mild dysarthria and right hemianopsia on evaluation. Imaging was significant for left PICA infarct. No apparent injuries. Neurology consulted and resumed his Plavix and started aspirin 81 mg for 3 months then aspirin alone. Outside of window for tenecteplase and no evidence of LVO. Recommended resuming home statin; however, per PCP notes, the patient has stopped his home Crestor due to "aches".  SLP eval and noted expressive aphasia.  He is tolerating heart healthy diet. Home anti-hypertensives held to allow permissive hypertension. 2D echo with EF of 55-60%. A1c = 5.5%. The patient requires inpatient physical medicine and rehabilitation evaluations and treatment secondary to dysfunction due to left PICA infarct.   PMH: Significant for MR s/p TEER, CAD s/p DES, CKD stage 3b, hypertension. Review of Systems  Constitutional:  Negative for chills and fever.  HENT:  Positive for hearing loss. Negative for sore throat.        Chronic throat clearing  Eyes:  Negative for blurred vision.  Respiratory:  Negative for cough and shortness of breath.   Cardiovascular:  Negative for chest pain and palpitations.  Gastrointestinal:  Positive for constipation. Negative for nausea and vomiting.  Neurological:  Negative for dizziness and headaches.  Endo/Heme/Allergies:  Bruises/bleeds easily.  Psychiatric/Behavioral:  Positive for memory loss.         Past Medical History:  Diagnosis Date   Asthma      a. Childhood.   Coronary artery disease      a. NSTEMI s/p DES to dRCA 2007. b. Angina 07/2009: s/p DESx2 to mid RCA for ISR and prox RCA, EF 65%. c. Stress test 05/2013: low  risk, no evidence of ischemia, EF 61%.   Dyslipidemia     GERD (gastroesophageal reflux disease)     History of kidney stones     Hypertension     Peripheral vascular disease (HCC)     Prostate cancer ([place])      a. s/p radical prostatectomy.   PVC's (premature ventricular contractions)     S/P mitral valve clip implantation 08/19/2020    s/p MitraClip implantation with a XTW by Dr. Burt Knack    Transient global amnesia      a. Adm 2009: imaging nonacute, had resolved.         Past Surgical History:  Procedure Laterality Date   CARDIAC CATHETERIZATION       COLONOSCOPY       CORONARY STENT INTERVENTION N/A 08/06/2020    Procedure: CORONARY STENT INTERVENTION;  Surgeon: Sherren Mocha, MD;  Location: [place] [facility];  Service: Cardiovascular;  Laterality: N/A;   EYE SURGERY Bilateral      cataracts removed   MASTOIDECTOMY        L ear   MITRAL VALVE REPAIR       MITRAL VALVE REPAIR N/A 08/19/2020    Procedure: MITRAL VALVE REPAIR;  Surgeon: Sherren Mocha, MD;  Location: [place] [facility];  Service: Cardiovascular;  Laterality: N/A;   PROSTATECTOMY       RIGHT/LEFT HEART CATH AND CORONARY ANGIOGRAPHY N/A 08/06/2020    Procedure: RIGHT/LEFT HEART CATH AND CORONARY ANGIOGRAPHY;  Surgeon: Sherren Mocha, MD;  Location: [place] [facility];  Service: Cardiovascular;  Laterality: N/A;   TEE WITHOUT CARDIOVERSION N/A 08/06/2020    Procedure: TRANSESOPHAGEAL ECHOCARDIOGRAM (TEE);  Surgeon: Sanda Klein, MD;  Location: [facility] ENDOSCOPY;  Service: Cardiovascular;  Laterality: N/A;   TEE WITHOUT CARDIOVERSION   08/19/2020   TEE WITHOUT CARDIOVERSION N/A 08/19/2020    Procedure: TRANSESOPHAGEAL ECHOCARDIOGRAM (TEE);  Surgeon: Sherren Mocha, MD;  Location: [place] [facility];  Service: Cardiovascular;  Laterality: N/A;         Family History  Problem Relation Age of Onset   CAD Neg Hx      Social History:  reports that he quit smoking about 43 years ago. His smoking use included cigarettes.  He has never used smokeless tobacco. He reports that he does not currently use alcohol. He reports that he does not use drugs. Allergies:       Allergies  Allergen Reactions   Simvastatin Other (See Comments)      MUSCLE ACHES REAL BAD   Atorvastatin        Other reaction(s): stiffness, decreased energy   Lisinopril Cough   Rosuvastatin        Other reaction(s): weakness          Medications Prior to Admission  Medication Sig Dispense Refill   aspirin EC 81 MG tablet Take 81 mg by mouth daily. Swallow whole.       ciprofloxacin-dexamethasone (CIPRODEX) OTIC suspension Place 4 drops into the left ear 2 (two) times daily as needed (ear infection).       FIBER PO Take 1 capsule by mouth daily.       furosemide (LASIX) 40 MG tablet TAKE 1 TABLET DAILY (Patient taking differently: Take 40 mg by mouth daily.) 90 tablet 1   gabapentin (NEURONTIN) 100 MG capsule Take 100 mg by mouth at bedtime.       losartan (COZAAR) 25 MG tablet TAKE 1 TABLET DAILY 90 tablet 3   metoprolol tartrate (LOPRESSOR) 25 MG tablet Take 25 mg by mouth 2 (two) times daily.       Multiple Vitamin (MULTIVITAMIN WITH MINERALS) TABS tablet Take 1 tablet by mouth daily.       Polyvinyl Alcohol-Povidone (REFRESH OP) Place 1 drop into both eyes daily as needed (dry eye).       nitroGLYCERIN (NITROSTAT) 0.4 MG SL tablet PLACE 1 TABLET UNDER THE TONGUE AT ONSET OF CHEST PAIN EVERY 5 MINTUES UP TO 3 TIMES AS NEEDED (Patient taking differently: Place 0.4 mg under the tongue every 5 (five) minutes as needed for chest pain.) 25 tablet 3          Home: Home Living Family/patient expects to be discharged to:: Inpatient rehab Living Arrangements: Children Available Help at Discharge: Family Type of Home: House Home Access: Level entry Home Layout: Laundry or work area in basement, Two level, Able to live on main level with bedroom/bathroom Alternate Level Stairs-Number of Steps: 15 steps to basement Alternate Level  Stairs-Rails: Right, Left, Can reach both Bathroom Shower/Tub: Gaffer, Chiropodist: Standard Bathroom Accessibility: Yes Home Equipment: Conservation officer, nature (2 wheels), Shower seat - built in Additional Comments: Per daughter, he will be living with her and her husband once discharged  Lives With: Alone   Functional History: Prior Function Prior Level of Function : Independent/Modified Independent, Working/employed, Driving Mobility Comments: Hydrographic surveyor without AD, drives, works ADLs Comments: Completely indep with all ADL's and IADL's   Functional Status:  Mobility: Bed Mobility Overal  bed mobility: Needs Assistance Bed Mobility: Supine to Sit Supine to sit: Supervision General bed mobility comments: Presents seated in chair (assisted by nursing staff) Transfers Overall transfer level: Needs assistance Equipment used: Rolling walker (2 wheels) Transfers: Sit to/from Stand, Bed to chair/wheelchair/BSC Sit to Stand: Min guard, Min assist Bed to/from chair/wheelchair/BSC transfer type:: Step pivot Step pivot transfers: Mod assist General transfer comment: Poor coordination; impulsive Ambulation/Gait Ambulation/Gait assistance: Min assist, Mod assist Gait Distance (Feet): 65 Feet Assistive device: Rolling walker (2 wheels) Gait Pattern/deviations: Decreased step length - right, Decreased step length - left, Decreased stance time - right, Ataxic, Steppage General Gait Details: increased endurance/distance for taking steps with requiring frequent verbal/tactile cueing to step closer to RW with fair carryover, has diffiuclty making turns to the right due, limited right ankle dorsiflexion with frequent steppage gait, fair/poor carryover for right heel to toe stepping Gait velocity: decreased   ADL: ADL Overall ADL's : Needs assistance/impaired Eating/Feeding: Minimal assistance, Sitting Grooming: Min guard, Standing, Wash/dry hands Grooming Details  (indicate cue type and reason): Standing at sink with RW available. Verbal cuing to locate paper towel to R side. Upper Body Bathing: Moderate assistance, Sitting Lower Body Bathing: Maximal assistance, Sit to/from stand, Sitting/lateral leans Upper Body Dressing : Moderate assistance, Sitting Lower Body Dressing: Supervision/safety, Sitting/lateral leans Lower Body Dressing Details (indicate cue type and reason): Pt able to dof and don socks with verbal cuing for technique while seated in recliner. Pt struggled with R LE but was able to complete with adjustment of attempts by crossing R LE over his L LE to assist with ability to reach his R foot. Toilet Transfer: Moderate assistance, Ambulation, Rolling walker (2 wheels) Toilet Transfer Details (indicate cue type and reason): Pt still impulsive and unsteady with RW needing phsyical assist to keep walker clost to pt's body rather than pushed out in front. Toileting- Clothing Manipulation and Hygiene: Maximal assistance, +2 for safety/equipment, Sitting/lateral lean, Sit to/from stand Functional mobility during ADLs: Moderate assistance, Rolling walker (2 wheels) General ADL Comments: Able to ambulate in room with RW.   Cognition: Cognition Overall Cognitive Status: Within Functional Limits for tasks assessed Orientation Level: Oriented to person, Other (comment) (intermittent confusion) Year: 2021 Day of Week: Correct Attention: Focused Memory: Impaired Memory Impairment: Decreased short term memory Decreased Short Term Memory: Verbal basic Awareness: Impaired Awareness Impairment: Anticipatory impairment Executive Function: Reasoning, Organizing Reasoning: Impaired Reasoning Impairment: Verbal basic Organizing: Impaired Organizing Impairment: Verbal basic Cognition Arousal/Alertness: Awake/alert Behavior During Therapy: WFL for tasks assessed/performed, Impulsive Overall Cognitive Status: Within Functional Limits for tasks  assessed Area of Impairment: Orientation, Attention, Memory, Following commands, Safety/judgement, Awareness, Problem solving Orientation Level: Disoriented to, Place, Situation Current Attention Level: Selective Memory: Decreased short-term memory, Decreased recall of precautions Following Commands: Follows one step commands consistently, Follows one step commands inconsistently Safety/Judgement: Decreased awareness of safety, Decreased awareness of deficits Awareness: Emergent Problem Solving: Slow processing, Difficulty sequencing, Requires verbal cues General Comments: Verbal and tactile cuing needed to follow direction safety during transfers and upper extremity exercises.   Physical Exam: Blood pressure (!) 150/90, pulse 77, temperature 98.4 F (36.9 C), temperature source Oral, resp. rate 14, height '5\' 9"'$  (1.753 m), weight 68.4 kg, SpO2 98 %. Physical Exam Constitutional:      General: He is not in acute distress. HENT:     Head: Normocephalic.     Comments: Right frontal contusion    Nose: Nose normal.  Eyes:     Extraocular Movements: Extraocular movements  intact.     Pupils: Pupils are equal, round, and reactive to light.  Cardiovascular:     Rate and Rhythm: Normal rate and regular rhythm.     Heart sounds: No murmur heard.    No gallop.  Pulmonary:     Effort: Pulmonary effort is normal.     Breath sounds: Normal breath sounds.  Abdominal:     General: Bowel sounds are normal. There is no distension.     Palpations: Abdomen is soft.     Tenderness: There is no abdominal tenderness.  Musculoskeletal:     Cervical back: Normal range of motion.  Skin:    General: Skin is warm.     Comments: Right upper arm skin tears and smaller tears on forearm and dorsal aspect of hand  Neurological:     Mental Status: He is alert and oriented to person, place, and time.     Motor: Weakness present.     Comments: Pt alert. Follows basic commands. Mild dysarthria. No aphasia.   Right HH. No apparent neglect. Mild right upper extremity weakness (RTC injury). Otherwise 4-5/5. No sensory changes. No witnessed limb ataxia    Right hand    Right forearm    Right upper arm    Lab Results Last 48 Hours        Results for orders placed or performed during the hospital encounter of 04/02/22 (from the past 48 hour(s))  CBC     Status: Abnormal    Collection Time: 04/05/22  4:07 AM  Result Value Ref Range    WBC 8.7 4.0 - 10.5 K/uL    RBC 3.75 (L) 4.22 - 5.81 MIL/uL    Hemoglobin 11.8 (L) 13.0 - 17.0 g/dL    HCT 36.0 (L) 39.0 - 52.0 %    MCV 96.0 80.0 - 100.0 fL    MCH 31.5 26.0 - 34.0 pg    MCHC 32.8 30.0 - 36.0 g/dL    RDW 12.6 11.5 - 15.5 %    Platelets 247 150 - 400 K/uL    nRBC 0.0 0.0 - 0.2 %      Comment: Performed at [facility], [address], [place], [place] [address]  Basic metabolic panel     Status: Abnormal    Collection Time: 04/05/22  4:07 AM  Result Value Ref Range    Sodium 139 135 - 145 mmol/L    Potassium 3.6 3.5 - 5.1 mmol/L    Chloride 108 98 - 111 mmol/L    CO2 24 22 - 32 mmol/L    Glucose, Bld 108 (H) 70 - 99 mg/dL      Comment: Glucose reference range applies only to samples taken after fasting for at least 8 hours.    BUN 30 (H) 8 - 23 mg/dL    Creatinine, Ser 1.50 (H) 0.61 - 1.24 mg/dL    Calcium 8.3 (L) 8.9 - 10.3 mg/dL    GFR, Estimated 45 (L) >60 mL/min      Comment: (NOTE) Calculated using the CKD-EPI Creatinine Equation (2021)      Anion gap 7 5 - 15      Comment: Performed at [facility], [address] [place], [place] [address]      Imaging Results (Last 48 hours)  No results found.         Blood pressure (!) 150/90, pulse 77, temperature 98.4 F (36.9 C), temperature source Oral, resp. rate 14, height '5\' 9"'$  (1.753 m), weight 68.4 kg, SpO2  98 %.   Medical Problem List and Plan: 1. Functional deficits secondary to acute left posterior cerebral artery infarct             -patient may  shower              -ELOS/Goals: 14-16 days, supervision goals with PT, OT, SLP 2.  Antithrombotics: -DVT/anticoagulation:  Pharmaceutical: Heparin             -antiplatelet therapy: aspirin 81 mg and Plavix daily for 3 months then aspirin only 3. Pain Management: Tylenol, oxycodone as needed             -gabapentin 100 mg q HS 4. Mood/Behavior/Sleep: LCSW to evaluate and provide emotional support             -antipsychotic agents: n/a 5. Neuropsych/cognition: This patient is capable of making decisions on his own behalf. 6. Skin/Wound Care: Routine skin care checks.              -local care to multiple skin tears above             -encourage appropriate nutrition 7. Fluids/Electrolytes/Nutrition: Routine Is and Os and follow-up chemistries 8: Hypertension: monitor TID and prn (home Lasix, Cozaar, Lopressor on hold)             -bp elevated at admit 9: Hyperlipidemia: heart healthy diet (patient declined Crestor PTA) follow-up with PCP and neurology 10: CAD s/p NSTEMI/multiple PCI, DES: on DAPT; follows with Dr. Marlou Porch 11: Prostate cancer s/p radical proctatectomy 12: Severe MR s/p TEER: continue Plavix 13: GERD: monitor 14: CKD stage 3b: baseline creatinine 10.4-1.6; follow-up BMP       Barbie Banner, PA-C 04/06/2022  I have personally performed a face to face diagnostic evaluation of this patient and formulated the key components of the plan.  Additionally, I have personally reviewed laboratory data, imaging studies, as well as relevant notes and concur with the physician assistant's documentation above.  The patient's status has not changed from the original H&P.  Any changes in documentation from the acute care chart have been noted above.  Meredith Staggers, MD, [facility] Drown

## 2022-04-07 NOTE — Progress Notes (Signed)
Inpatient Rehab Admissions Coordinator:  There is a bed available in CIR for pt today. Dr. Carles Collet is aware and in agreement. Pt, pt's daughter [name], TOC, and NSG aware.   Gayland Curry, [place], [place] Admissions Coordinator [phone]

## 2022-04-07 NOTE — Progress Notes (Signed)
Patient dressing to R arm changed. Three individual skin tears to same area, cleansed and re dressed.

## 2022-04-07 NOTE — Progress Notes (Signed)
Inpatient Rehabilitation Admission Medication Review by a Pharmacist  A complete drug regimen review was completed for this patient to identify any potential clinically significant medication issues.  High Risk Drug Classes Is patient taking? Indication by Medication  Antipsychotic Yes Compazine - prn N/V  Anticoagulant Yes Lovenox - VTE ppx  Antibiotic No   Opioid Yes Oxycodone - prn pain  Antiplatelet Yes Aspirin, Plavix - CVA  Hypoglycemics/insulin No   Vasoactive Medication No   Chemotherapy No   Other Yes Trazodone - prn sleep Gabapentin - pain     Type of Medication Issue Identified Description of Issue Recommendation(s)  Drug Interaction(s) (clinically significant)     Duplicate Therapy     Allergy     No Medication Administration End Date     Incorrect Dose     Additional Drug Therapy Needed     Significant med changes from prior encounter (inform family/care partners about these prior to discharge). Furosemide 20 mg daily Metoprolol 25 mg BID Resume if and when appropriate on rehab or at discharge   Other       Clinically significant medication issues were identified that warrant physician communication and completion of prescribed/recommended actions by midnight of the next day:  No  Time spent performing this drug regimen review (minutes):  30 minutes  Thank you Anette Guarneri, PharmD

## 2022-04-07 NOTE — Discharge Summary (Signed)
Physician Discharge Summary   Patient: Adam Arellano MRN: [id number] DOB: 1932/05/08  Admit date:     04/02/2022  Discharge date: 04/07/22  Discharge Physician: Shanon Brow Jasmen Emrich   PCP: Lajean Manes, MD   Recommendations at discharge:   Please follow up with primary care provider within 1-2 weeks  Please repeat BMP and CBC in one week   Discharge Diagnoses: Principal Problem:   Acute ischemic stroke [facility]) Active Problems:   Essential hypertension   Chronic kidney disease, stage 3b ([place])   Hypokalemia   Fall at home, initial encounter  Resolved Problems:   * No resolved hospital problems. [facility] Course: No notes on file  Assessment and Plan: * Acute ischemic stroke ([place]) - Right upper extremity weakness, reported dysarthria when he was found and is experiencing right hemianopsia. -Patient has past swallowing test -LDL 69, A1c 5.5 -MRI demonstrating left PCA infarct -Appreciate neurology service>>continue aspirin and Plavix for 63-monththen ASA 81 mg daily thereafter for secondary prevention. -Physical therapy recommending CIR -2D echo--EF 55-60, no WMA, G1DD, no PFO - EEG--no seizure -initially allowed for permissive hypertension. -Patient will also need 30 days event monitoring(cardiology has been made aware).   Fall at home, initial encounter - Likely related to CVA - No fractures appreciated -Physical therapy has seen patient with recommendation for CIR -Continue supportive care and assist -Maintain adequate hydration.   Hypokalemia - Repleted and within normal limits currently -Continue to follow electrolytes trend.   Chronic kidney disease, stage 3b (HCC) -baseline creatinine 1.4-1.6 - Appears to be stable -Maintain adequate hydration -Continue to follow renal function trend.   Essential hypertension - Follow-up vital signs -Blood pressure overall is stable while allowing permissive hypertension -Slowly resume back antihypertensive agents -Heart  healthy diet discussed with patient.           Consultants: neurology Procedures performed: none  Disposition: Rehabilitation facility CIR Diet recommendation:  Cardiac diet DISCHARGE MEDICATION: Allergies as of 04/07/2022       Reactions   Simvastatin Other (See Comments)   MUSCLE ACHES REAL BAD   Atorvastatin    Other reaction(s): stiffness, decreased energy   Lisinopril Cough   Rosuvastatin    Other reaction(s): weakness        Medication List     TAKE these medications    aspirin EC 81 MG tablet Take 81 mg by mouth daily. Swallow whole.   ciprofloxacin-dexamethasone OTIC suspension Commonly known as: CIPRODEX Place 4 drops into the left ear 2 (two) times daily as needed (ear infection).   clopidogrel 75 MG tablet Commonly known as: PLAVIX Take 1 tablet (75 mg total) by mouth daily. Start taking on: April 08, 2022   FIBER PO Take 1 capsule by mouth daily.   furosemide 40 MG tablet Commonly known as: LASIX TAKE 1 TABLET DAILY   gabapentin 100 MG capsule Commonly known as: NEURONTIN Take 100 mg by mouth at bedtime.   losartan 25 MG tablet Commonly known as: COZAAR TAKE 1 TABLET DAILY   metoprolol tartrate 25 MG tablet Commonly known as: LOPRESSOR Take 25 mg by mouth 2 (two) times daily.   multivitamin with minerals Tabs tablet Take 1 tablet by mouth daily.   nitroGLYCERIN 0.4 MG SL tablet Commonly known as: NITROSTAT PLACE 1 TABLET UNDER THE TONGUE AT ONSET OF CHEST PAIN EVERY 5 MINTUES UP TO 3 TIMES AS NEEDED What changed:  how much to take how to take this when to take this reasons to take this additional  instructions   REFRESH OP Place 1 drop into both eyes daily as needed (dry eye).        Discharge Exam: Filed Weights   04/02/22 1842 04-20-2022 0910  Weight: 73 kg 68.4 kg   HEENT:  [place]/AT, No thrush, no icterus CV:  RRR, no rub, no S3, no S4 Lung:  CTA, no wheeze, no rhonchi Abd:  soft/+BS, NT Ext:  No edema, no  lymphangitis, no synovitis, no rash   Condition at discharge: stable  The results of significant diagnostics from this hospitalization (including imaging, microbiology, ancillary and laboratory) are listed below for reference.   Imaging Studies: EEG adult  Result Date: 2022-04-20 Lora Havens, MD     2022/04/20  5:06 PM Patient Name: Adam Arellano MRN: [id number] Epilepsy Attending: Lora Havens Referring Physician/Provider: Lora Havens, MD Date: 04-20-2022 Duration: 21.56 mins Patient history: 88yo M with ams. EEG to evaluate for seizure Level of alertness: Awake AEDs during EEG study: GBP Technical aspects: This EEG study was done with scalp electrodes positioned according to the 10-20 International system of electrode placement. Electrical activity was reviewed with band pass filter of 1-'70Hz'$ , sensitivity of 7 uV/mm, display speed of 59m/sec with a '60Hz'$  notched filter applied as appropriate. EEG data were recorded continuously and digitally stored.  Video monitoring was available and reviewed as appropriate. Description: The posterior dominant rhythm consists of 9-10 Hz activity of moderate voltage (25-35 uV) seen predominantly in posterior head regions, symmetric and reactive to eye opening and eye closing. EEG showed intermittent 2-3 Hz delta slowing In left parietal region. Hyperventilation and photic stimulation were not performed.   ABNORMALITY - Intermittent slow, left parietal region IMPRESSION: This study is suggestive of cortical dysfunction arising from left parietal region likely secondary to underlying stroke. No seizures or epileptiform discharges were seen throughout the recording. PLora Havens  ECHOCARDIOGRAM COMPLETE  Result Date: 111-16-2023   ECHOCARDIOGRAM REPORT   Patient Name:   Adam RODRIQUESDate of Exam: 111/16/2023Medical Rec #:  [id number]        Height:       67.0 in Accession #:    [id number]       Weight:       160.9 lb Date of Birth:   3December 26, 1933         BSA:          1.844 m Patient Age:    88years          BP:           171/78 mmHg Patient Gender: M                 HR:           75 bpm. Exam [facility]:  [facility]cedure: 2D Echo, Cardiac Doppler and Color Doppler Indications:    Stroke  History:        Patient has prior history of Echocardiogram examinations, most                 recent 11/24/2021. CAD and Previous Myocardial Infarction,                 Stroke, Arrythmias:PVC, Signs/Symptoms:Fatigue; Risk                 Factors:Hypertension and Dyslipidemia. Mitraclip implantation                 08/19/20.  Mitral Valve: Mitra-Clip valve is present in the mitral                 position.  Sonographer:    Wenda Low Referring Phys: [id number] ASIA B [place]  1. Left ventricular ejection fraction, by estimation, is 55 to 60%. The left ventricle has normal function. The left ventricle has no regional wall motion abnormalities. There is mild left ventricular hypertrophy. Left ventricular diastolic parameters are consistent with Grade I diastolic dysfunction (impaired relaxation).  2. Right ventricular systolic function is normal. The right ventricular size is normal. There is mildly elevated pulmonary artery systolic pressure. The estimated right ventricular systolic pressure is 84.1 mmHg.  3. Left atrial size was mildly dilated.  4. The mitral valve has been repaired/replaced. Mild mitral valve regurgitation. The mean mitral valve gradient is 3.0 mmHg. There is a Mitra-Clip present in the mitral position.  5. Tricuspid valve regurgitation is mild to moderate.  6. The aortic valve is tricuspid. Aortic valve regurgitation is not visualized. Aortic valve sclerosis/calcification is present, without any evidence of aortic stenosis. Aortic valve mean gradient measures 5.5 mmHg.  7. The inferior vena cava is normal in size with greater than 50% respiratory variability, suggesting right atrial pressure of 3 mmHg.  Comparison(s): Prior images reviewed side by side. LVEF remains normal at 55-60%. MitraClip in position with mild posterior leaflet prolapse, mild mitral regurgitation and stable mean gradient. FINDINGS  Left Ventricle: Left ventricular ejection fraction, by estimation, is 55 to 60%. The left ventricle has normal function. The left ventricle has no regional wall motion abnormalities. The left ventricular internal cavity size was normal in size. There is  mild left ventricular hypertrophy. Left ventricular diastolic parameters are consistent with Grade I diastolic dysfunction (impaired relaxation). Right Ventricle: The right ventricular size is normal. No increase in right ventricular wall thickness. Right ventricular systolic function is normal. There is mildly elevated pulmonary artery systolic pressure. The tricuspid regurgitant velocity is 3.05  m/s, and with an assumed right atrial pressure of 3 mmHg, the estimated right ventricular systolic pressure is 32.4 mmHg. Left Atrium: Left atrial size was mildly dilated. Right Atrium: Right atrial size was normal in size. Pericardium: There is no evidence of pericardial effusion. Presence of epicardial fat layer. Mitral Valve: The mitral valve has been repaired/replaced. Mild mitral valve regurgitation. There is a Mitra-Clip present in the mitral position. MV peak gradient, 9.6 mmHg. The mean mitral valve gradient is 3.0 mmHg. Tricuspid Valve: The tricuspid valve is grossly normal. Tricuspid valve regurgitation is mild to moderate. Aortic Valve: The aortic valve is tricuspid. There is mild to moderate aortic valve annular calcification. Aortic valve regurgitation is not visualized. Aortic valve sclerosis/calcification is present, without any evidence of aortic stenosis. Aortic valve mean gradient measures 5.5 mmHg. Aortic valve peak gradient measures 11.1 mmHg. Aortic valve area, by VTI measures 1.74 cm. Pulmonic Valve: The pulmonic valve was grossly normal. Pulmonic  valve regurgitation is trivial. Aorta: The aortic root is normal in size and structure. Venous: The inferior vena cava is normal in size with greater than 50% respiratory variability, suggesting right atrial pressure of 3 mmHg. IAS/Shunts: No atrial level shunt detected by color flow Doppler.  LEFT VENTRICLE PLAX 2D LVIDd:         4.80 cm   Diastology LVIDs:         3.40 cm   LV e' medial:    5.22 cm/s LV PW:  1.20 cm   LV E/e' medial:  14.5 LV IVS:        1.10 cm   LV e' lateral:   12.80 cm/s LVOT diam:     2.00 cm   LV E/e' lateral: 5.9 LV SV:         63 LV SV Index:   34 LVOT Area:     3.14 cm  RIGHT VENTRICLE RV Basal diam:  3.30 cm RV Mid diam:    2.60 cm RV S prime:     12.10 cm/s TAPSE (M-mode): 2.4 cm LEFT ATRIUM             Index        RIGHT ATRIUM           Index LA diam:        4.50 cm 2.44 cm/m   RA Area:     19.10 cm LA Vol (A2C):   62.7 ml 34.01 ml/m  RA Volume:   44.60 ml  24.19 ml/m LA Vol (A4C):   64.2 ml 34.82 ml/m LA Biplane Vol: 64.7 ml 35.09 ml/m  AORTIC VALVE                     PULMONIC VALVE AV Area (Vmax):    1.65 cm      PV Vmax:       0.88 m/s AV Area (Vmean):   1.58 cm      PV Peak grad:  3.1 mmHg AV Area (VTI):     1.74 cm AV Vmax:           166.50 cm/s AV Vmean:          113.000 cm/s AV VTI:            0.362 m AV Peak Grad:      11.1 mmHg AV Mean Grad:      5.5 mmHg LVOT Vmax:         87.50 cm/s LVOT Vmean:        56.900 cm/s LVOT VTI:          0.200 m LVOT/AV VTI ratio: 0.55  AORTA Ao Root diam: 3.60 cm MITRAL VALVE                TRICUSPID VALVE MV Area (PHT): 1.91 cm     TR Peak grad:   37.2 mmHg MV Area VTI:   1.92 cm     TR Vmax:        305.00 cm/s MV Peak grad:  9.6 mmHg MV Mean grad:  3.0 mmHg     SHUNTS MV Vmax:       1.55 m/s     Systemic VTI:  0.20 m MV Vmean:      74.5 cm/s    Systemic Diam: 2.00 cm MV Decel Time: 397 msec MV E velocity: 75.80 cm/s MV A velocity: 126.00 cm/s MV E/A ratio:  0.60 Rozann Lesches MD Electronically signed by Rozann Lesches MD  Signature Date/Time: 04/03/2022/1:31:51 PM    Final    US Carotid Bilateral (at [place] [facility] and [facility] only)  Result Date: 04/03/2022 CLINICAL DATA:  88 year old male with syncope and altered mental status EXAM: BILATERAL CAROTID DUPLEX ULTRASOUND TECHNIQUE: Pearline Cables scale imaging, color Doppler and duplex ultrasound were performed of bilateral carotid and vertebral arteries in the neck. COMPARISON:  None Available. FINDINGS: Criteria: Quantification of carotid stenosis is based on velocity parameters that correlate the residual internal carotid diameter with NASCET-based stenosis levels, using  the diameter of the distal internal carotid lumen as the denominator for stenosis measurement. The following velocity measurements were obtained: RIGHT ICA:  Systolic 672 cm/sec, Diastolic 32 cm/sec CCA:  99 cm/sec SYSTOLIC ICA/CCA RATIO:  1.7 ECA:  119 cm/sec LEFT ICA:  Systolic 78 cm/sec, Diastolic 15 cm/sec CCA:  094 cm/sec SYSTOLIC ICA/CCA RATIO:  0.5 ECA:  109 cm/sec Right Brachial SBP: Not acquired Left Brachial SBP: Not acquired RIGHT CAROTID ARTERY: No significant calcifications of the right common carotid artery. Intermediate waveform maintained. Heterogeneous and partially calcified plaque at the right carotid bifurcation. No significant lumen shadowing. Low resistance waveform of the right ICA. No significant tortuosity. RIGHT VERTEBRAL ARTERY: Antegrade flow with low resistance waveform. LEFT CAROTID ARTERY: No significant calcifications of the left common carotid artery. Intermediate waveform maintained. Heterogeneous and partially calcified plaque at the left carotid bifurcation without significant lumen shadowing. Low resistance waveform of the left ICA. No significant tortuosity. LEFT VERTEBRAL ARTERY:  Antegrade flow with low resistance waveform. IMPRESSION: Right: Heterogeneous and partially calcified plaque at the right carotid bifurcation, with discordant results regarding degree of stenosis by established duplex  criteria. Peak velocity suggests 50%-69% stenosis, with the ICA/ CCA ratio suggesting a lesser degree of stenosis. If establishing a more accurate degree of stenosis is required, cerebral angiogram should be considered, or as a second best test, CTA. Left: Color duplex indicates minimal heterogeneous and calcified plaque, with no hemodynamically significant stenosis by duplex criteria in the extracranial cerebrovascular circulation. Signed, Dulcy Fanny. Nadene Rubins, RPVI [facility] Electronically Signed   By: Corrie Mckusick D.O.   On: 04/03/2022 09:40   MR BRAIN WO CONTRAST  Result Date: 04/03/2022 CLINICAL DATA:  Stroke, follow up EXAM: MRI HEAD WITHOUT CONTRAST MRA HEAD WITHOUT CONTRAST TECHNIQUE: Multiplanar, multi-echo pulse sequences of the brain and surrounding structures were acquired without intravenous contrast. Angiographic images of the Circle of Willis were acquired using MRA technique without intravenous contrast. COMPARISON:  CT head April 02, 2022 FINDINGS: MRI HEAD FINDINGS Brain: Acute left PCA territory infarct, including restricted diffusion in the left occipital lobe as well as the left thalamocapsular junction. Edema without mass effect. Additional mild for age scattered T2/FLAIR hyperintensities in the white matter, nonspecific but compatible with chronic microvascular ischemic disease. No evidence of acute hemorrhage, mass lesion, midline shift or hydrocephalus. Vascular: Detailed below. Skull and upper cervical spine: Normal marrow signal. Sinuses/Orbits: Clear sinuses.  No acute orbital findings. Other: Trace left mastoid effusion. MRA HEAD FINDINGS Motion limited study. Anterior circulation: Bilateral intracranial ICAs, MCAs, and ACAs are patent. Potentially severe right prox A2 ACA stenosis. Approximately 3 mm outpouching arising from the left MCA bifurcation, concerning for aneurysm. Posterior circulation: Bilateral  visualized intradural vertebral arteries and basilar artery are patent without significant stenosis. Right PCAs patent without proximal high-grade stenosis. Occlusion (favored) versus severe stenosis of the left P2 PCA. IMPRESSION: MRI: Acute left PCA territory infarct. MRA: 1. Occlusion (favored) versus severe stenosis of the left P2 PCA. 2. Potentially severe proximal right A2 ACA stenosis. 3. Approximately 3 mm outpouching arising from the left MCA bifurcation, concerning for aneurysm. Recommend CTA to confirm and further characterize. Electronically Signed   By: Margaretha Sheffield M.D.   On: 04/03/2022 08:58   MR ANGIO HEAD WO CONTRAST  Result Date: 04/03/2022 CLINICAL DATA:  Stroke, follow up EXAM: MRI HEAD WITHOUT CONTRAST MRA HEAD WITHOUT CONTRAST TECHNIQUE: Multiplanar, multi-echo pulse sequences of the brain and surrounding structures were acquired without  intravenous contrast. Angiographic images of the Circle of Willis were acquired using MRA technique without intravenous contrast. COMPARISON:  CT head April 02, 2022 FINDINGS: MRI HEAD FINDINGS Brain: Acute left PCA territory infarct, including restricted diffusion in the left occipital lobe as well as the left thalamocapsular junction. Edema without mass effect. Additional mild for age scattered T2/FLAIR hyperintensities in the white matter, nonspecific but compatible with chronic microvascular ischemic disease. No evidence of acute hemorrhage, mass lesion, midline shift or hydrocephalus. Vascular: Detailed below. Skull and upper cervical spine: Normal marrow signal. Sinuses/Orbits: Clear sinuses.  No acute orbital findings. Other: Trace left mastoid effusion. MRA HEAD FINDINGS Motion limited study. Anterior circulation: Bilateral intracranial ICAs, MCAs, and ACAs are patent. Potentially severe right prox A2 ACA stenosis. Approximately 3 mm outpouching arising from the left MCA bifurcation, concerning for aneurysm. Posterior circulation: Bilateral  visualized intradural vertebral arteries and basilar artery are patent without significant stenosis. Right PCAs patent without proximal high-grade stenosis. Occlusion (favored) versus severe stenosis of the left P2 PCA. IMPRESSION: MRI: Acute left PCA territory infarct. MRA: 1. Occlusion (favored) versus severe stenosis of the left P2 PCA. 2. Potentially severe proximal right A2 ACA stenosis. 3. Approximately 3 mm outpouching arising from the left MCA bifurcation, concerning for aneurysm. Recommend CTA to confirm and further characterize. Electronically Signed   By: Margaretha Sheffield M.D.   On: 04/03/2022 08:58   CT Head Wo Contrast  Result Date: 04/02/2022 CLINICAL DATA:  Altered mental status with trauma to the head, face, and cervical spine. The patient is reportedly found normal approximately 24 hours ago. EXAM: CT HEAD WITHOUT CONTRAST CT MAXILLOFACIAL WITHOUT CONTRAST CT CERVICAL SPINE WITHOUT CONTRAST TECHNIQUE: Multidetector CT imaging of the head, cervical spine, and maxillofacial structures were performed using the standard protocol without intravenous contrast. Multiplanar CT image reconstructions of the cervical spine and maxillofacial structures were also generated. RADIATION DOSE REDUCTION: This exam was performed according to the departmental dose-optimization program which includes automated exposure control, adjustment of the mA and/or kV according to patient size and/or use of iterative reconstruction technique. COMPARISON:  CT head dated 08/26/2007. FINDINGS: CT HEAD FINDINGS Brain: There is loss of the gray-white matter differentiation in a moderate volume region of the left temporal lobe and occipital lobe in the left posterior cerebral artery territory. There is no significant swelling of the parenchyma or associated mass effect, suggestive of a hyperacute to acute infarct. No acute intra or extra-axial fluid collection is identified. There is mild cerebral volume loss with associated ex  vacuo dilatation. There is no midline shift and the basilar cisterns are patent. Periventricular white matter hypoattenuation likely represents chronic small vessel ischemic disease. Vascular: There are vascular calcifications in the carotid siphons. Skull: Normal. Negative for fracture or focal lesion. Other: Soft tissue density in the left middle ear likely reflects the patient's known cholesteatoma. CT MAXILLOFACIAL FINDINGS Osseous: No fracture or mandibular dislocation. No destructive process. Orbits: Negative. No traumatic or inflammatory finding. Sinuses: There is right maxillary sinus disease. Soft tissues: Negative. CT CERVICAL SPINE FINDINGS Alignment: There is chronic appearing anterolisthesis of C2 on C3 C3 on C4, each measuring 3 mm. There is 2 mm retrolisthesis of C4 on C5. No acute traumatic listhesis is identified. Skull base and vertebrae: No acute fracture. No primary bone lesion or focal pathologic process. Soft tissues and spinal canal: No prevertebral fluid or swelling. No visible canal hematoma. Disc levels: Up to severe multilevel degenerative disc and joint disease. Upper chest: Negative. Other: None. IMPRESSION:  1. Moderate volume hyperacute to acute infarct in the left posterior cerebral artery territory without significant associated mass effect. 2. No acute facial bone fracture. 3. No acute osseous injury in the cervical spine. These results were called by telephone at the time of interpretation on 04/02/2022 at 8:27 pm to provider [facility] ZAMMIT , who verbally acknowledged these results. Electronically Signed   By: Zerita Boers M.D.   On: 04/02/2022 20:31   CT Cervical Spine Wo Contrast  Result Date: 04/02/2022 CLINICAL DATA:  Altered mental status with trauma to the head, face, and cervical spine. The patient is reportedly found normal approximately 24 hours ago. EXAM: CT HEAD WITHOUT CONTRAST CT MAXILLOFACIAL WITHOUT CONTRAST CT CERVICAL SPINE WITHOUT CONTRAST TECHNIQUE:  Multidetector CT imaging of the head, cervical spine, and maxillofacial structures were performed using the standard protocol without intravenous contrast. Multiplanar CT image reconstructions of the cervical spine and maxillofacial structures were also generated. RADIATION DOSE REDUCTION: This exam was performed according to the departmental dose-optimization program which includes automated exposure control, adjustment of the mA and/or kV according to patient size and/or use of iterative reconstruction technique. COMPARISON:  CT head dated 08/26/2007. FINDINGS: CT HEAD FINDINGS Brain: There is loss of the gray-white matter differentiation in a moderate volume region of the left temporal lobe and occipital lobe in the left posterior cerebral artery territory. There is no significant swelling of the parenchyma or associated mass effect, suggestive of a hyperacute to acute infarct. No acute intra or extra-axial fluid collection is identified. There is mild cerebral volume loss with associated ex vacuo dilatation. There is no midline shift and the basilar cisterns are patent. Periventricular white matter hypoattenuation likely represents chronic small vessel ischemic disease. Vascular: There are vascular calcifications in the carotid siphons. Skull: Normal. Negative for fracture or focal lesion. Other: Soft tissue density in the left middle ear likely reflects the patient's known cholesteatoma. CT MAXILLOFACIAL FINDINGS Osseous: No fracture or mandibular dislocation. No destructive process. Orbits: Negative. No traumatic or inflammatory finding. Sinuses: There is right maxillary sinus disease. Soft tissues: Negative. CT CERVICAL SPINE FINDINGS Alignment: There is chronic appearing anterolisthesis of C2 on C3 C3 on C4, each measuring 3 mm. There is 2 mm retrolisthesis of C4 on C5. No acute traumatic listhesis is identified. Skull base and vertebrae: No acute fracture. No primary bone lesion or focal pathologic process.  Soft tissues and spinal canal: No prevertebral fluid or swelling. No visible canal hematoma. Disc levels: Up to severe multilevel degenerative disc and joint disease. Upper chest: Negative. Other: None. IMPRESSION: 1. Moderate volume hyperacute to acute infarct in the left posterior cerebral artery territory without significant associated mass effect. 2. No acute facial bone fracture. 3. No acute osseous injury in the cervical spine. These results were called by telephone at the time of interpretation on 04/02/2022 at 8:27 pm to provider [facility] ZAMMIT , who verbally acknowledged these results. Electronically Signed   By: Zerita Boers M.D.   On: 04/02/2022 20:31   CT Maxillofacial Wo Contrast  Result Date: 04/02/2022 CLINICAL DATA:  Altered mental status with trauma to the head, face, and cervical spine. The patient is reportedly found normal approximately 24 hours ago. EXAM: CT HEAD WITHOUT CONTRAST CT MAXILLOFACIAL WITHOUT CONTRAST CT CERVICAL SPINE WITHOUT CONTRAST TECHNIQUE: Multidetector CT imaging of the head, cervical spine, and maxillofacial structures were performed using the standard protocol without intravenous contrast. Multiplanar CT image reconstructions of the cervical spine and maxillofacial structures were also generated. RADIATION DOSE REDUCTION: This  exam was performed according to the departmental dose-optimization program which includes automated exposure control, adjustment of the mA and/or kV according to patient size and/or use of iterative reconstruction technique. COMPARISON:  CT head dated 08/26/2007. FINDINGS: CT HEAD FINDINGS Brain: There is loss of the gray-white matter differentiation in a moderate volume region of the left temporal lobe and occipital lobe in the left posterior cerebral artery territory. There is no significant swelling of the parenchyma or associated mass effect, suggestive of a hyperacute to acute infarct. No acute intra or extra-axial fluid collection is  identified. There is mild cerebral volume loss with associated ex vacuo dilatation. There is no midline shift and the basilar cisterns are patent. Periventricular white matter hypoattenuation likely represents chronic small vessel ischemic disease. Vascular: There are vascular calcifications in the carotid siphons. Skull: Normal. Negative for fracture or focal lesion. Other: Soft tissue density in the left middle ear likely reflects the patient's known cholesteatoma. CT MAXILLOFACIAL FINDINGS Osseous: No fracture or mandibular dislocation. No destructive process. Orbits: Negative. No traumatic or inflammatory finding. Sinuses: There is right maxillary sinus disease. Soft tissues: Negative. CT CERVICAL SPINE FINDINGS Alignment: There is chronic appearing anterolisthesis of C2 on C3 C3 on C4, each measuring 3 mm. There is 2 mm retrolisthesis of C4 on C5. No acute traumatic listhesis is identified. Skull base and vertebrae: No acute fracture. No primary bone lesion or focal pathologic process. Soft tissues and spinal canal: No prevertebral fluid or swelling. No visible canal hematoma. Disc levels: Up to severe multilevel degenerative disc and joint disease. Upper chest: Negative. Other: None. IMPRESSION: 1. Moderate volume hyperacute to acute infarct in the left posterior cerebral artery territory without significant associated mass effect. 2. No acute facial bone fracture. 3. No acute osseous injury in the cervical spine. These results were called by telephone at the time of interpretation on 04/02/2022 at 8:27 pm to provider [facility] , who verbally acknowledged these results. Electronically Signed   By: Zerita Boers M.D.   On: 04/02/2022 20:31   DG Chest Port 1 View  Result Date: 04/02/2022 CLINICAL DATA:  Weakness. EXAM: PORTABLE CHEST 1 VIEW COMPARISON:  11/02/2021 FINDINGS: 1913 hours. Lungs are hyperexpanded. The cardio pericardial silhouette is enlarged. Interstitial markings are diffusely coarsened  with chronic features. The lungs are clear without focal pneumonia, edema, pneumothorax or pleural effusion. Telemetry leads overlie the chest. IMPRESSION: Emphysema and cardiomegaly without acute cardiopulmonary findings. Electronically Signed   By: Misty Stanley M.D.   On: 04/02/2022 19:48   DG Pelvis Portable  Result Date: 04/02/2022 CLINICAL DATA:  Weakness. EXAM: PORTABLE PELVIS 1-2 VIEWS COMPARISON:  None Available. FINDINGS: No acute fracture or dislocation. The bones are osteopenic. Degenerative changes of the lower lumbar spine. Multiple surgical clips over the pelvis. The soft tissues are grossly unremarkable. IMPRESSION: No acute fracture or dislocation. Osteopenia. Electronically Signed   By: Anner Crete M.D.   On: 04/02/2022 19:47    Microbiology: Results for orders placed or performed during the hospital encounter of 04/02/22  Urine Culture     Status: None   Collection Time: 04/02/22  6:57 PM   Specimen: In/Out Cath Urine  Result Value Ref Range Status   Specimen Description   Final    IN/OUT CATH URINE Performed at [facility], [address], [place], [place] [address]    Special Requests   Final    NONE Performed at [facility], [address] [place], [place] [address]    Culture  Final    NO GROWTH Performed at [facility], [place] [address], [place], [place] [address]    Report Status 04/04/2022 FINAL  Final    Labs: CBC: Recent Labs  Lab 04/02/22 1901 04/03/22 0615 04/05/22 0407  WBC 11.5* 8.9 8.7  NEUTROABS 9.6*  --   --   HGB 12.7* 11.5* 11.8*  HCT 38.7* 35.4* 36.0*  MCV 95.8 97.3 96.0  PLT 268 233 818   Basic Metabolic Panel: Recent Labs  Lab 04/02/22 1901 04/03/22 0615 04/05/22 0407  NA 142 144 139  K 3.2* 3.8 3.6  CL 110 112* 108  CO2 '23 23 24  '$ GLUCOSE 105* 89 108*  BUN 37* 33* 30*  CREATININE 1.61* 1.42* 1.50*  CALCIUM 8.7* 8.4* 8.3*   Liver Function Tests: Recent Labs  Lab 04/02/22 1901 04/03/22 0615  AST 29 24   ALT 19 17  ALKPHOS 66 58  BILITOT 1.1 1.0  PROT 6.5 5.7*  ALBUMIN 3.8 3.1*   CBG: No results for input(s): "GLUCAP" in the last 168 hours.  Discharge time spent: greater than 30 minutes.  Signed: Orson Eva, MD [facility] 04/07/2022

## 2022-04-07 NOTE — Progress Notes (Signed)
Signed     PMR Admission Coordinator Pre-Admission Assessment   Patient: Adam Arellano is an 88 y.o., male MRN: [id number] DOB: Sep 16, 1931 Height: _0  (175.3 cm) Weight: 68.4 kg   Insurance Information HMO:  yes   PPO:      PCP:      IPA:      80/20:   OTHER:  PRIMARY: UHC Medicare       Policy#: [id number]      Subscriber: [name]      Phone#: TBD     Fax#: Fax: [phone] Pre-Cert#: [id number] Received insurance approval from [place] on 04/06/22. Pt approved for 14 days beginning 11/2-11/15. Updates due 04/19/22.     Employer:  Benefits:  Phone #:      Name:  Fax: [phone] Eff Date: 06/05/2021 - still active Deductible: $0 (does not have deductible) OOP Max: $3,600 ($337.01 met) CIR: $295/day co-pay for days 1-5, $0/day co-pay for days 6+ SNF: $0.00 Copayment per day for days 1-20; $196.00 Copayment per day for days 21-39; $0.00 Copayment per day for days 40-100 for Medicare-covered care/maximum 100 days/benefit period Outpatient: $20/visit co-pay/visit Home Health:  100% coverage; limited by medical necessity DME: 80% coverage; 20% co-insurance Providers: in network   SECONDARY:       Policy#:      Phone#:    Development worker, community:       Phone#:    The Engineer, petroleum" for patients in Inpatient Rehabilitation Facilities with attached "Privacy Act [place] Records" was provided and verbally reviewed with: Family   Emergency Contact Information Contact Information       Name Relation Home Work [place] Daughter [phone]               Current Medical History  Patient Admitting Diagnosis: CVA History of Present Illness: Pt. Is an 88 y.o. male with PMH significant for  transient global amnesia in 2009, hypertension, hyperlipidemia, coronary artery disease with NSTEMI status post multiple PCI's, prostate cancer status post radical prostatectomy, severe mitral regurgitation status post TEER ( 08/19/2020) who  presented to [facility] ED on 04/02/22 due to being found at home lying in bed covered in bruises, bleeding, and with urine incontinence.. CT positive for an acute stroke in the L posterior cerbral artery territory.  On admission, temp 98.4-98.8, heart rate 73-99, respiratory rate 14-25, blood pressure 162/71-195/98, satting 98-100% , 11.5, hemoglobin 12.7, platelets 268. Pt. was Hypokalemic at 3.2, creatinine at baseline, UA unremarkable for UTI. CT of the C-spine and head showed moderate volume hyperacute to acute infarct in left posterior cerebral artery territory without significant mass effect.  No facial fractures.  No acute osseous injury of the C-spine.Chest x-ray showed emphysema and cardiomegaly without acute cardiopulmonary disease. Pelvis shows no acute fracture or dislocation. Patient was given half a liter bolus and  Neurology was consulted They recommended starting Plavix and doing work-up at [facility]. Plavix 300 started, patient was already on aspirin. EEG demonstrating cortical dysfunction due to underlying stroke without epileptic waves abnormalities. Pt. Seen by PT/OT who recommended CIR to assist return to PLOF.    Complete NIHSS TOTAL: 8   Patient's medical record from [facility]  has been reviewed by the rehabilitation admission coordinator and physician.   Past Medical History      Past Medical History:  Diagnosis Date   Asthma      a. Childhood.   Coronary artery disease  a. NSTEMI s/p DES to dRCA 2007. b. Angina 07/2009: s/p DESx2 to mid RCA for ISR and prox RCA, EF 65%. c. Stress test 05/2013: low risk, no evidence of ischemia, EF 61%.   Dyslipidemia     GERD (gastroesophageal reflux disease)     History of kidney stones     Hypertension     Peripheral vascular disease (HCC)     Prostate cancer ([place])      a. s/p radical prostatectomy.   PVC's (premature ventricular contractions)     S/P mitral valve clip implantation 08/19/2020    s/p MitraClip  implantation with a XTW by Dr. Burt Knack    Transient global amnesia      a. Adm 2009: imaging nonacute, had resolved.      Has the patient had major surgery during 100 days prior to admission? No   Family History   family history is not on file.   Current Medications   Current Facility-Administered Medications:    acetaminophen (TYLENOL) tablet 650 mg, 650 mg, Oral, Q4H PRN **OR** acetaminophen (TYLENOL) 160 MG/5ML solution 650 mg, 650 mg, Per Tube, Q4H PRN **OR** acetaminophen (TYLENOL) suppository 650 mg, 650 mg, Rectal, Q4H PRN, Zierle-Ghosh, Asia B, DO   aspirin EC tablet 81 mg, 81 mg, Oral, Daily, Zierle-Ghosh, Asia B, DO, 81 mg at 04/04/22 1005   clopidogrel (PLAVIX) tablet 75 mg, 75 mg, Oral, Daily, Zierle-Ghosh, Asia B, DO, 75 mg at 04/04/22 1005   gabapentin (NEURONTIN) capsule 100 mg, 100 mg, Oral, QHS, Zierle-Ghosh, Asia B, DO, 100 mg at 04/03/22 2106   heparin injection 5,000 Units, 5,000 Units, Subcutaneous, Q8H, Zierle-Ghosh, Asia B, DO, 5,000 Units at 04/04/22 0541   oxyCODONE (Oxy IR/ROXICODONE) immediate release tablet 5 mg, 5 mg, Oral, Q4H PRN, Zierle-Ghosh, Asia B, DO   senna-docusate (Senokot-S) tablet 1 tablet, 1 tablet, Oral, QHS PRN, Zierle-Ghosh, Asia B, DO   Patients Current Diet:  Diet Order                  Diet Heart Room service appropriate? Yes; Fluid consistency: Thin  Diet effective now                         Precautions / Restrictions Precautions Precautions: Fall Restrictions Weight Bearing Restrictions: No    Has the patient had 2 or more falls or a fall with injury in the past year? No   Prior Activity Level Community (5-7x/wk): Pt. active in the community PTA   Prior Functional Level Self Care: Did the patient need help bathing, dressing, using the toilet or eating? Independent   Indoor Mobility: Did the patient need assistance with walking from room to room (with or without device)? Independent   Stairs: Did the patient need  assistance with internal or external stairs (with or without device)? Independent   Functional Cognition: Did the patient need help planning regular tasks such as shopping or remembering to take medications? Independent   Patient Information Are you of Hispanic, Latino/a,or Spanish origin?: A. No, not of Hispanic, Latino/a, or Spanish origin What is your race?: A. White Do you need or want an interpreter to communicate with a doctor or health care staff?: 0. No   Patient's Response To:  Health Literacy and Transportation Is the patient able to respond to health literacy and transportation needs?: Yes Health Literacy - How often do you need to have someone help you when you read instructions, pamphlets, or other written material  from your doctor or pharmacy?: Never In the past 12 months, has lack of transportation kept you from medical appointments or from getting medications?: No In the past 12 months, has lack of transportation kept you from meetings, work, or from getting things needed for daily living?: No   Development worker, international aid / [place] Devices/Equipment: None Home Equipment: Conservation officer, nature (2 wheels), Shower seat - built in   Prior Device Use: Indicate devices/aids used by the patient prior to current illness, exacerbation or injury? None of the above   Current Functional Level Cognition   Overall Cognitive Status: Within Functional Limits for tasks assessed Current Attention Level: Selective Orientation Level: Oriented to person, Oriented to place, Disoriented to time, Disoriented to situation Following Commands: Follows one step commands consistently, Follows one step commands inconsistently Safety/Judgement: Decreased awareness of safety, Decreased awareness of deficits General Comments: Verbal and tactile cuing needed to follow direction safety during transfers. Pt's family reported that at baseline he does things very quickly or impulsively. Attention:  Focused Memory: Impaired Memory Impairment: Decreased short term memory Decreased Short Term Memory: Verbal basic Awareness: Impaired Awareness Impairment: Anticipatory impairment Executive Function: Reasoning, Organizing Reasoning: Impaired Reasoning Impairment: Verbal basic Organizing: Impaired Organizing Impairment: Verbal basic    Extremity Assessment (includes Sensation/Coordination)   Upper Extremity Assessment: Defer to OT evaluation RUE Deficits / Details: Ataxic movements, globally weak, decreased ROM due to past rotator cuff injury RUE Sensation: decreased light touch, decreased proprioception RUE Coordination: decreased fine motor, decreased gross motor  Lower Extremity Assessment: RLE deficits/detail RLE Deficits / Details: grossly 4/5 except hamstrings 3+/5, ankle dorsiflexion 3+5 RLE Sensation: decreased light touch, decreased proprioception RLE Coordination: decreased gross motor     ADLs   Overall ADL's : Needs assistance/impaired Eating/Feeding: Minimal assistance, Sitting Grooming: Minimal assistance, Standing, Wash/dry face, Applying deodorant, Sitting, Min guard Grooming Details (indicate cue type and reason): Pt able to attempt deodorant application while seated in the recliner but needs assist under R arm due to wakness and range of motions deficits. Pt able to wash his face at the sink with verbal cuing and min G to min A for standing balance with RW available. Upper Body Bathing: Moderate assistance, Sitting Lower Body Bathing: Maximal assistance, Sit to/from stand, Sitting/lateral leans Upper Body Dressing : Moderate assistance, Sitting Lower Body Dressing: Supervision/safety, Sitting/lateral leans Lower Body Dressing Details (indicate cue type and reason): Pt able to dof and don socks with verbal cuing for technique while seated in recliner. Pt struggled with R LE but was able to complete with adjustment of attempts by crossing R LE over his L LE to assist  with ability to reach his R foot. Toilet Transfer: Moderate assistance, Ambulation, Rolling walker (2 wheels), BSC/3in1 Toilet Transfer Details (indicate cue type and reason): Pt able to ambulate to toilet with BSC placed over the top of the toilet seat. Moderate assitance needed for the transfer to the toilet with pt noted to struggle to lift from toilet initially without use of grab bar. Toileting- Clothing Manipulation and Hygiene: Maximal assistance, +2 for safety/equipment, Sitting/lateral lean, Sit to/from stand Functional mobility during ADLs: Moderate assistance, Rolling walker (2 wheels) General ADL Comments: Pt able to ambulate within room to sink and back and to toilet and back.     Mobility   Overal bed mobility: Needs Assistance Bed Mobility: Supine to Sit Supine to sit: Min guard General bed mobility comments: Pt seated in chair at start of session.     Transfers  Overall transfer level: Needs assistance Equipment used: Rolling walker (2 wheels) Transfers: Sit to/from Stand, Bed to chair/wheelchair/BSC Sit to Stand: Min guard, Min assist Bed to/from chair/wheelchair/BSC transfer type:: Step pivot Step pivot transfers: Mod assist General transfer comment: Pt able to boost from chair with min G assist to min A. Most assist for keeping RW steady but pt boosting from chair well. When stepping for the transfer pt was unsteady needing assist for balance and managmeent of RW.     Ambulation / Gait / Stairs / Wheelchair Mobility   Ambulation/Gait Ambulation/Gait assistance: Mod assist Gait Distance (Feet): 35 Feet Assistive device: None, Rolling walker (2 wheels) Gait Pattern/deviations: Decreased step length - right, Decreased step length - left, Decreased stance time - right, Knees buckling, Ataxic, Scissoring General Gait Details: at high risk for falls ambulating without AD due to ataxic like gait on RLE with frequent buckling of knee and scisooring legs when making turns, has  to lean on nearby objects for support when not using RW, severe neglect of right side due to hemianopsia Gait velocity: decreased     Posture / Balance Dynamic Sitting Balance Sitting balance - Comments: seated in recliner Balance Overall balance assessment: Needs assistance Sitting-balance support: Feet supported, Bilateral upper extremity supported Sitting balance-Leahy Scale: Good Sitting balance - Comments: seated in recliner Standing balance support: Bilateral upper extremity supported, During functional activity, Reliant on assistive device for balance Standing balance-Leahy Scale: Poor Standing balance comment: poor to fair using RW     Special needs/care consideration Special service needs none    Previous Home Environment (from acute therapy documentation) Living Arrangements: Children  Lives With: Alone Available Help at Discharge: Family Type of Home: House Home Layout: Laundry or work area in basement, Two level, Able to live on main level with bedroom/bathroom Alternate Level Stairs-Rails: Right, Left, Can reach both Alternate Level Stairs-Number of Steps: 15 steps to basement Home Access: Level entry Bathroom Shower/Tub: Gaffer, Chiropodist: Programmer, systems: Yes Home Care Services: No Additional Comments: Per daughter, he will be living with her and her husband once discharged   Discharge Living Setting Plans for Discharge Living Setting: Patient's home Type of Home at Discharge: House Discharge Home Layout: One level Discharge Home Access: Level entry Discharge Bathroom Shower/Tub: Walk-in shower, Tub/shower unit Discharge Bathroom Toilet: Standard Discharge Bathroom Accessibility: Yes How Accessible: Accessible via walker, Accessible via wheelchair Does the patient have any problems obtaining your medications?: No   Social/Family/Support Systems Patient Roles: Other (Comment) Contact Information:  [phone] Anticipated Caregiver: Campbell Stall Anticipated Caregiver's Contact Information: [phone] Ability/Limitations of Caregiver: Min A Caregiver Availability: 24/7 Discharge Plan Discussed with Primary Caregiver: Yes Is Caregiver In Agreement with Plan?: Yes Does Caregiver/Family have Issues with Lodging/Transportation while Pt is in Rehab?: Yes   Goals Patient/Family Goal for Rehab: PT/OT/SLP Supervision Expected length of stay: 14-16 days Pt/Family Agrees to Admission and willing to participate: Yes Program Orientation Provided & Reviewed with Pt/Caregiver Including Roles  & Responsibilities: Yes   Decrease burden of Care through IP rehab admission: n/a   Possible need for SNF placement upon discharge: no    Patient Condition: I have reviewed medical records from [facility] , spoken with CM, and patient and daughter. I discussed via phone for inpatient rehabilitation assessment.  Patient will benefit from ongoing PT, OT, and SLP, can actively participate in 3 hours of therapy a day 5 days of the week, and can make measurable gains during the admission.  Patient will also benefit from the coordinated team approach during an Inpatient Acute Rehabilitation admission.  The patient will receive intensive therapy as well as Rehabilitation physician, nursing, social worker, and care management interventions.  Due to safety, skin/wound care, disease management, medication administration, pain management, and patient education the patient requires 24 hour a day rehabilitation nursing.  The patient is currently Min G-Mod A with mobility and  basic ADLs.  Discharge setting and therapy post discharge at home with home health is anticipated.  Patient has agreed to participate in the Acute Inpatient Rehabilitation Program and will admit today.   Preadmission Screen Completed By:  Genella Mech, updates by Gayland Curry 04/04/2022 3:01  PM ______________________________________________________________________   Discussed status with Dr. Naaman Plummer on 04/07/22  at 10:06 AM and received approval for admission today.   Admission Coordinator:  Genella Mech, CCC-SLP, updates by Gayland Curry, MS, CCC-SLP time 10:06 AM/Date 04/07/22     Assessment/Plan: Diagnosis: Left PCA infarct Does the need for close, 24 hr/day Medical supervision in concert with the patient's rehab needs make it unreasonable for this patient to be served in a less intensive setting? Yes Co-Morbidities requiring supervision/potential complications: CAD, HTN, MR, prostate cancer Due to bladder management, bowel management, safety, skin/wound care, disease management, medication administration, pain management, and patient education, does the patient require 24 hr/day rehab nursing? Yes Does the patient require coordinated care of a physician, rehab nurse, PT, OT, and SLP to address physical and functional deficits in the context of the above medical diagnosis(es)? Yes Addressing deficits in the following areas: balance, endurance, locomotion, strength, transferring, bowel/bladder control, bathing, dressing, feeding, grooming, toileting, cognition, speech, swallowing, and psychosocial support Can the patient actively participate in an intensive therapy program of at least 3 hrs of therapy 5 days a week? Yes The potential for patient to make measurable gains while on inpatient rehab is excellent Anticipated functional outcomes upon discharge from inpatient rehab: supervision PT, supervision OT, supervision SLP Estimated rehab length of stay to reach the above functional goals is: 14-16 Anticipated discharge destination: Home 10. Overall Rehab/Functional Prognosis: excellent     MD Signature: Meredith Staggers, MD, [place] Director Rehabilitation Services 04/07/2022

## 2022-04-07 NOTE — Plan of Care (Signed)
  Problem: Education: Goal: Knowledge of disease or condition will improve Outcome: Progressing Goal: Knowledge of secondary prevention will improve (MUST DOCUMENT ALL) Outcome: Progressing   Problem: Ischemic Stroke/TIA Tissue Perfusion: Goal: Complications of ischemic stroke/TIA will be minimized Outcome: Progressing   Problem: Coping: Goal: Will verbalize positive feelings about self Outcome: Progressing Goal: Will identify appropriate support needs Outcome: Progressing   Problem: Health Behavior/Discharge Planning: Goal: Ability to manage health-related needs will improve Outcome: Progressing Goal: Goals will be collaboratively established with patient/family Outcome: Progressing   Problem: Self-Care: Goal: Ability to participate in self-care as condition permits will improve Outcome: Progressing Goal: Verbalization of feelings and concerns over difficulty with self-care will improve Outcome: Progressing Goal: Ability to communicate needs accurately will improve Outcome: Progressing   Problem: Nutrition: Goal: Risk of aspiration will decrease Outcome: Progressing Goal: Dietary intake will improve Outcome: Progressing   Problem: Education: Goal: Knowledge of General Education information will improve Description: Including pain rating scale, medication(s)/side effects and non-pharmacologic comfort measures Outcome: Progressing   Problem: Health Behavior/Discharge Planning: Goal: Ability to manage health-related needs will improve Outcome: Progressing   Problem: Clinical Measurements: Goal: Ability to maintain clinical measurements within normal limits will improve Outcome: Progressing Goal: Will remain free from infection Outcome: Progressing Goal: Diagnostic test results will improve Outcome: Progressing Goal: Respiratory complications will improve Outcome: Progressing Goal: Cardiovascular complication will be avoided Outcome: Progressing   Problem:  Activity: Goal: Risk for activity intolerance will decrease Outcome: Progressing   Problem: Nutrition: Goal: Adequate nutrition will be maintained Outcome: Progressing   Problem: Coping: Goal: Level of anxiety will decrease Outcome: Progressing   Problem: Elimination: Goal: Will not experience complications related to bowel motility Outcome: Progressing Goal: Will not experience complications related to urinary retention Outcome: Progressing   Problem: Pain Managment: Goal: General experience of comfort will improve Outcome: Progressing   Problem: Safety: Goal: Ability to remain free from injury will improve Outcome: Progressing   Problem: Skin Integrity: Goal: Risk for impaired skin integrity will decrease Outcome: Progressing

## 2022-04-07 NOTE — Progress Notes (Signed)
Patient report attempted x1.

## 2022-04-08 DIAGNOSIS — C61 Malignant neoplasm of prostate: Secondary | ICD-10-CM

## 2022-04-08 MED ORDER — METOPROLOL TARTRATE 25 MG PO TABS
25.0000 mg | ORAL_TABLET | Freq: Two times a day (BID) | ORAL | Status: DC
  Administered 2022-04-08 – 2022-04-21 (×27): 25 mg via ORAL
  Filled 2022-04-08 (×27): qty 1

## 2022-04-08 MED ORDER — ENOXAPARIN SODIUM 30 MG/0.3ML IJ SOSY
30.0000 mg | PREFILLED_SYRINGE | INTRAMUSCULAR | Status: DC
  Administered 2022-04-08 – 2022-04-16 (×9): 30 mg via SUBCUTANEOUS
  Filled 2022-04-08 (×9): qty 0.3

## 2022-04-08 NOTE — Plan of Care (Signed)
  Problem: Consults Goal: RH STROKE PATIENT EDUCATION Description: See Patient Education module for education specifics  Outcome: Progressing   Problem: RH BOWEL ELIMINATION Goal: RH STG MANAGE BOWEL WITH ASSISTANCE Description: STG Manage Bowel with mod I  Assistance. Outcome: Progressing Goal: RH STG MANAGE BOWEL W/MEDICATION W/ASSISTANCE Description: STG Manage Bowel with Medication with mod I Assistance. Outcome: Progressing   Problem: RH BLADDER ELIMINATION Goal: RH STG MANAGE BLADDER WITH ASSISTANCE Description: STG Manage Bladder With toileting Assistance Outcome: Progressing   Problem: RH SAFETY Goal: RH STG ADHERE TO SAFETY PRECAUTIONS W/ASSISTANCE/DEVICE Description: STG Adhere to Safety Precautions With cuesAssistance/Device. Outcome: Progressing   Problem: RH KNOWLEDGE DEFICIT Goal: RH STG INCREASE KNOWLEDGE OF DIABETES Description: Patient and family will be able to manage DM with medications and dietary modifications using handouts and educational resources independently Outcome: Progressing Goal: RH STG INCREASE KNOWLEDGE OF HYPERTENSION Description: Patient and family will be able to manage HTN with medications and dietary modifications using handouts and educational resources independently Outcome: Progressing Goal: RH STG INCREASE KNOWLEGDE OF HYPERLIPIDEMIA Description: Patient and family will be able to manage HLD with medications and dietary modifications using handouts and educational resources independently Outcome: Progressing Goal: RH STG INCREASE KNOWLEDGE OF STROKE PROPHYLAXIS Description: Patient and family will be able to manage secondary risks with medications and dietary modifications using handouts and educational resources independently Outcome: Progressing   Problem: RH Vision Goal: RH LTG Vision (Specify) Outcome: Progressing   Problem: Education: Goal: Knowledge of disease or condition will improve Outcome: Progressing Goal: Knowledge of  secondary prevention will improve (MUST DOCUMENT ALL) Outcome: Progressing Goal: Knowledge of patient specific risk factors will improve [place]ta [place] N/A or DELETE if not current risk factor) Outcome: Progressing   Problem: Ischemic Stroke/TIA Tissue Perfusion: Goal: Complications of ischemic stroke/TIA will be minimized Outcome: Progressing   Problem: Coping: Goal: Will verbalize positive feelings about self Outcome: Progressing Goal: Will identify appropriate support needs Outcome: Progressing   Problem: Health Behavior/Discharge Planning: Goal: Ability to manage health-related needs will improve Outcome: Progressing Goal: Goals will be collaboratively established with patient/family Outcome: Progressing   Problem: Self-Care: Goal: Ability to participate in self-care as condition permits will improve Outcome: Progressing Goal: Verbalization of feelings and concerns over difficulty with self-care will improve Outcome: Progressing Goal: Ability to communicate needs accurately will improve Outcome: Progressing   Problem: Nutrition: Goal: Risk of aspiration will decrease Outcome: Progressing Goal: Dietary intake will improve Outcome: Progressing

## 2022-04-08 NOTE — Evaluation (Signed)
Physical Therapy Assessment and Plan  Patient Details  Name: Adam Arellano MRN: [id number] Date of Birth: [birth year]  PT Diagnosis: Abnormal posture, Abnormality of gait, Ataxic gait, Cognitive deficits, Difficulty walking, Hemiparesis dominant, and Muscle weakness Rehab Potential: Good ELOS: 14 days   Today's Date: 04/08/2022 PT Individual Time: 1050-1200 PT Individual Time Calculation (min): 70 min    Hospital Problem: Principal Problem:   Acute ischemic left posterior cerebral artery (PCA) stroke ([place])   Past Medical History:  Past Medical History:  Diagnosis Date   Asthma    a. Childhood.   Coronary artery disease    a. NSTEMI s/p DES to dRCA 2007. b. Angina 07/2009: s/p DESx2 to mid RCA for ISR and prox RCA, EF 65%. c. Stress test 05/2013: low risk, no evidence of ischemia, EF 61%.   Dyslipidemia    GERD (gastroesophageal reflux disease)    History of kidney stones    Hypertension    Peripheral vascular disease (HCC)    Prostate cancer ([place])    a. s/p radical prostatectomy.   PVC's (premature ventricular contractions)    S/P mitral valve clip implantation 08/19/2020   s/p MitraClip implantation with a XTW by Dr. Burt Knack    Transient global amnesia    a. Adm 2009: imaging nonacute, had resolved.   Past Surgical History:  Past Surgical History:  Procedure Laterality Date   CARDIAC CATHETERIZATION     COLONOSCOPY     CORONARY STENT INTERVENTION N/A 08/06/2020   Procedure: CORONARY STENT INTERVENTION;  Surgeon: Sherren Mocha, MD;  Location: [place] [facility];  Service: Cardiovascular;  Laterality: N/A;   EYE SURGERY Bilateral    cataracts removed   MASTOIDECTOMY     L ear   MITRAL VALVE REPAIR     MITRAL VALVE REPAIR N/A 08/19/2020   Procedure: MITRAL VALVE REPAIR;  Surgeon: Sherren Mocha, MD;  Location: [place] [facility];  Service: Cardiovascular;  Laterality: N/A;   PROSTATECTOMY     RIGHT/LEFT HEART CATH AND CORONARY ANGIOGRAPHY N/A 08/06/2020   Procedure:  RIGHT/LEFT HEART CATH AND CORONARY ANGIOGRAPHY;  Surgeon: Sherren Mocha, MD;  Location: [place] [facility];  Service: Cardiovascular;  Laterality: N/A;   TEE WITHOUT CARDIOVERSION N/A 08/06/2020   Procedure: TRANSESOPHAGEAL ECHOCARDIOGRAM (TEE);  Surgeon: Sanda Klein, MD;  Location: [facility] ENDOS[facility];  Service: Cardiovascular;  Laterality: N/A;   TEE WITHOUT CARDIOVERSION  08/19/2020   TEE WITHOUT CARDIOVERSION N/A 08/19/2020   Procedure: TRANSESOPHAGEAL ECHOCARDIOGRAM (TEE);  Surgeon: Sherren Mocha, MD;  Location: [place] [facility];  Service: Cardiovascular;  Laterality: N/A;    Assessment & Plan Clinical Impression: Adam Arellano is an 88 year old male who was found down at his home my his neighbor on 04/02/2022. He presented to the [facility] ED with right-sided weakness, mild dysarthria and right hemianopsia on evaluation. Imaging was significant for left PICA infarct. No apparent injuries. Neurology consulted and resumed his Plavix and started aspirin 81 mg for 3 months then aspirin alone. Outside of window for tenecteplase and no evidence of LVO. Recommended resuming home statin; however, per PCP notes, the patient has stopped his home Crestor due to "aches".  SLP eval and noted expressive aphasia.  He is tolerating heart healthy diet. Home anti-hypertensives held to allow permissive hypertension. 2D echo with EF of 55-60%. A1c = 5.5%. The patient requires inpatient physical medicine and rehabilitation evaluations and treatment secondary to dysfunction due to left PICA infarct.Patient transferred to CIR on 04/07/2022 .   Patient currently requires  SPV for bed mobility, min assist for transfers, mod assist for ambulation      with mobility secondary to muscle weakness, decreased cardiorespiratoy endurance, impaired timing and sequencing, ataxia, and decreased coordination,  , decreased attention to right, decreased memory, and decreased sitting balance, decreased standing balance, decreased  postural control, and decreased balance strategies.  Prior to hospitalization, patient was independent  with mobility and lived with Alone in a House home.  Home access is  Level entry. However, pt will be living with daughter and son-in-law at discharge who have 1 STE and pt unable to express if there is a HR.   Patient will benefit from skilled PT intervention to maximize safe functional mobility, minimize fall risk, and decrease caregiver burden for planned discharge home with 24 hour supervision and assist.  Anticipate patient will benefit from follow up [facility] at discharge.  PT - End of Session Activity Tolerance: Tolerates 30+ min activity with multiple rests Endurance Deficit: Yes PT Assessment Rehab Potential (ACUTE/IP ONLY): Good PT Barriers to Discharge: Wound Care PT Patient demonstrates impairments in the following area(s): Balance;Behavior;Edema;Endurance;Motor;Pain;Perception;Safety;Sensory;Skin Integrity PT Transfers Functional Problem(s): Bed Mobility;Bed to Chair;Car PT Locomotion Functional Problem(s): Ambulation;Stairs PT Plan PT Intensity: Minimum of 1-2 x/day ,45 to 90 minutes PT Frequency: 5 out of 7 days PT Duration Estimated Length of Stay: 14 days PT Treatment/Interventions: Ambulation/gait training;Cognitive remediation/compensation;Discharge planning;DME/adaptive equipment instruction;Functional mobility training;Pain management;Psychosocial support;Splinting/orthotics;Therapeutic Activities;UE/LE Strength taining/ROM;Visual/perceptual remediation/compensation;Balance/vestibular training;Community reintegration;Disease management/prevention;Functional electrical stimulation;Neuromuscular re-education;Patient/family education;Skin care/wound management;Stair training;Therapeutic Exercise;UE/LE Coordination activities;Wheelchair propulsion/positioning PT Transfers Anticipated Outcome(s): SPV PT Locomotion Anticipated Outcome(s): CGA PT Recommendation Follow Up  Recommendations: Home health PT;24 hour supervision/assistance Patient destination: Home (daughter's home) Equipment Recommended: To be determined   PT Evaluation Precautions/Restrictions Precautions Precautions: Fall Precaution Comments: R hemiparesis, R inattention, fragile skin. Restrictions Weight Bearing Restrictions: No Pain Pain Assessment Pain Scale: 0-10 Pain Score: 0-No pain Pain Interference Pain Interference Pain Effect on Sleep: 1. Rarely or not at all Pain Interference with Therapy Activities: 1. Rarely or not at all Pain Interference with Day-to-Day Activities: 1. Rarely or not at all Home Living/Prior [place] Available Help at Discharge: Family;Available 24 hours/day (Pt planning to go live with daughter and son-in-law at discharge) Type of Home: House Home Access: Stairs to enter [employer] of Steps: 1 step Entrance Stairs-Rails: None (pt can't remember) Home Layout: Two level;Able to live on main level with bedroom/bathroom (ground level and basement) Alternate Level Stairs-Number of Steps: 15 steps to basement Alternate Level Stairs-Rails: Right;Left;Can reach both Additional Comments: Per daughter, he will be living with her and her husband once discharged  Lives With: Alone Prior Function Level of Independence: Independent with basic ADLs;Independent with gait;Independent with homemaking with ambulation;Independent with transfers  Able to Take Stairs?: Yes Driving: Yes Vocation: Retired Vision/Perception  Vision - History Ability to See in Adequate Light: 0 Adequate Vision - Assessment Eye Alignment: Within Functional Limits Ocular Range of Motion: Restricted looking down (Pt has difficult following finger to inferior right and left) Tracking/Visual Pursuits: Requires cues, head turns, or add eye shifts to track Perception Perception: Impaired Inattention/Neglect: Does not attend to right visual field;Does not attend to  right side of body Praxis Praxis: Intact  Cognition Overall Cognitive Status: Impaired/Different from baseline Arousal/Alertness: Awake/alert Orientation Level: Oriented to person;Oriented to place;Oriented to situation;Disoriented to time Year: Other (Comment) (did not know) Month: November Memory: Impaired Memory Impairment: Decreased short term memory Awareness: Impaired Behaviors: Impulsive Safety/Judgment: Impaired Sensation Sensation Light Touch: Appears Intact Hot/Cold:  Not tested Proprioception: Impaired by gross assessment Stereognosis: Not tested Coordination Gross Motor Movements are Fluid and Coordinated: No Coordination and Movement Description: decreased coordination noted with ambulation and Heel Shin test Heel Shin Test: jerky movements of right LE Motor  Motor Motor: Ataxia;Other (comment);Abnormal postural alignment and control (hemiparesis of R side) Motor - Skilled Clinical Observations: Decreased smoothness of movement with R UE and LE while ambulating.   Trunk/Postural Assessment  Cervical Assessment Cervical Assessment: Exceptions to [facility] (forward head) Thoracic Assessment Thoracic Assessment: Exceptions to [facility] (rounded shoulders) Lumbar Assessment Lumbar Assessment: Exceptions to [facility] (posterior pelvic tilt) Postural Control Postural Control: Deficits on evaluation (posterior lean in standing)  Balance Balance Balance Assessed: Yes Standardized Balance Assessment Standardized Balance Assessment: Berg Balance Test Berg Balance Test Sit to Stand: Needs minimal aid to stand or to stabilize Standing Unsupported: Unable to stand 30 seconds unassisted Sitting with Back Unsupported but Feet Supported on Floor or Stool: Able to sit 2 minutes under supervision Stand to Sit: Uses backs of legs against chair to control descent Transfers: Needs one person to assist Standing Unsupported with Eyes Closed: Needs help to keep from falling Standing Ubsupported  with Feet Together: Needs help to attain position and unable to hold for 15 seconds From Standing, Reach Forward with Outstretched Arm: Loses balance while trying/requires external support From Standing Position, Pick up Object from Floor: Unable to try/needs assist to keep balance From Standing Position, Turn to Look Behind Over each Shoulder: Needs assist to keep from losing balance and falling Turn 360 Degrees: Needs assistance while turning Standing Unsupported, Alternately Place Feet on Step/Stool: Needs assistance to keep from falling or unable to try Standing Unsupported, One Foot in Front: Loses balance while stepping or standing Standing on One Leg: Unable to try or needs assist to prevent fall Total Score: 7 Static Sitting Balance Static Sitting - Balance Support: Feet unsupported;No upper extremity supported Static Sitting - Level of Assistance: 5: Stand by assistance (SPV) Dynamic Sitting Balance Dynamic Sitting - Balance Support: Feet unsupported;No upper extremity supported Dynamic Sitting - Level of Assistance: 5: Stand by assistance (SPV) Dynamic Sitting - Balance Activities: Reaching for objects Static Standing Balance Static Standing - Balance Support: No upper extremity supported;During functional activity Static Standing - Level of Assistance: 4: Min assist;3: Mod assist Dynamic Standing Balance Dynamic Standing - Balance Support: No upper extremity supported;During functional activity Dynamic Standing - Level of Assistance: 3: Mod assist;2: Max assist Dynamic Standing - Balance Activities: Reaching for objects Extremity Assessment     RLE Assessment RLE Assessment: Exceptions to [facility] General Strength Comments: grossly assessed in sitting RLE Strength Right Hip Flexion: 3+/5 Right Knee Flexion: 3+/5 Right Knee Extension: 4+/5 Right Ankle Dorsiflexion: 4+/5 Right Ankle Plantar Flexion: 4+/5 LLE Assessment LLE Assessment: Within Functional Limits  Care  Tool Care Tool Bed Mobility Roll left and right activity   Roll left and right assist level: Supervision/Verbal cueing    Sit to lying activity   Sit to lying assist level: Supervision/Verbal cueing    Lying to sitting on side of bed activity   Lying to sitting on side of bed assist level: the ability to move from lying on the back to sitting on the side of the bed with no back support.: Supervision/Verbal cueing     Care Tool Transfers Sit to stand transfer   Sit to stand assist level: Minimal Assistance - Patient > 75%    Chair/bed transfer   Chair/bed transfer assist level: Minimal Assistance -  Patient > 75%     Physiological scientist transfer assist level: Minimal Assistance - Patient > 75%      Care Tool Locomotion Ambulation   Assist level: Moderate Assistance - Patient 50 - 74% Assistive device: Other (comment) (hallway hand rail) Max distance: 30  Walk 10 feet activity   Assist level: Moderate Assistance - Patient - 50 - 74% Assistive device: Other (comment) (hallway handrail)   Walk 50 feet with 2 turns activity Walk 50 feet with 2 turns activity did not occur: Safety/medical concerns      Walk 150 feet activity Walk 150 feet activity did not occur: Safety/medical concerns      Walk 10 feet on uneven surfaces activity   Assist level: Moderate Assistance - Patient - 50 - 74% Assistive device: Other (comment) (ramp handrail)  Stairs   Assist level: Minimal Assistance - Patient > 75% Stairs assistive device: 2 hand rails Max number of stairs: 4  Walk up/down 1 step activity   Walk up/down 1 step (curb) assist level: Moderate Assistance - Patient - 50 - 74% Walk up/down 1 step or curb assistive device: 2 hand rails  Walk up/down 4 steps activity   Walk up/down 4 steps assist level: Moderate Assistance - Patient - 50 - 74% Walk up/down 4 steps assistive device: 2 hand rails  Walk up/down 12 steps activity Walk up/down 12 steps activity did  not occur: Safety/medical concerns      Pick up small objects from floor   Pick up small object from the floor assist level: Moderate Assistance - Patient 50 - 74%    Wheelchair   Type of Wheelchair: Manual     Max wheelchair distance: 139f  Wheel 50 feet with 2 turns activity   Assist Level: Dependent - Patient 0%  Wheel 150 feet activity   Assist Level: Dependent - Patient 0%    Refer to Care Plan for Long Term Goals  SHORT TERM GOAL WEEK 1 PT Short Term Goal 1 (Week 1): Pt will perform bed mobility with mod i consistently. PT Short Term Goal 2 (Week 1): Pt will perform sit<>stand transfers with CGA and LRAD consistently. PT Short Term Goal 3 (Week 1): Pt will perform bed<>chair transfers with CGA and LRAD consistently. PT Short Term Goal 4 (Week 1): Pt will ambulate 534fwith min assist and LRAD consistently. PT Short Term Goal 5 (Week 1): Pt will perform stair negotiation, 1 step, with min assist consistenly.  Recommendations for other services: None   Skilled Therapeutic Intervention   [facility]   Evaluation completed (see details above) with patient education regarding purpose of PT evaluation, PT POC and goals, therapy schedule, weekly team meetings, and other CIR information including safety plan and fall risk safety. Pt performed the below functional mobility tasks with the specified levels of skilled cuing and assistance. Pt shows increased impulsivity, increased R inattention, and R hemiparesis. Pt often losing balance posteriorly and to the right.   Pt left seated in WC with call bell in reach, brakes locked, posey belt on, all needs met.   Mobility Bed Mobility Bed Mobility: Sit to Supine;Supine to Sit Supine to Sit: Supervision/Verbal cueing Sit to Supine: Supervision/Verbal cueing Transfers Transfers: Sit to Stand;Stand to Sit;Stand Pivot Transfers Sit to Stand: Minimal Assistance - Patient > 75% Stand to Sit:  Minimal Assistance - Patient > 75% Stand Pivot Transfers:  Minimal Assistance - Patient > 75% Stand Pivot Transfer Details: Verbal cues for sequencing;Verbal cues for technique Transfer (Assistive device): None Locomotion  Gait Ambulation: Yes Gait Assistance: Minimal Assistance - Patient > 75%;Moderate Assistance - Patient 50-74% Gait Distance (Feet): 30 Feet (x2) Assistive device: None;Other (Comment) (hand rail used for initial 30 ft, no AD for second set) Gait Assistance Details: Verbal cues for precautions/safety;Visual cues/gestures for precautions/safety;Tactile cues for posture;Verbal cues for gait pattern Gait Gait: Yes Gait Pattern: Impaired Gait Pattern: Decreased step length - right;Decreased step length - left;Decreased hip/knee flexion - right;Decreased hip/knee flexion - left;Decreased stride length;Ataxic;Scissoring;Narrow base of support Gait velocity: decreased Stairs / Additional Locomotion Stairs: Yes Stairs Assistance: Moderate Assistance - Patient 50 - 74% Stair Management Technique: Two rails;Forwards (right ascending, left descending) Number of Stairs: 4 Height of Stairs: 6 Ramp: Moderate Assistance - Patient 50 - 74% Wheelchair Mobility Wheelchair Mobility: No   Discharge Criteria: Patient will be discharged from PT if patient refuses treatment 3 consecutive times without medical reason, if treatment goals not met, if there is a change in medical status, if patient makes no progress towards goals or if patient is discharged from hospital.  The above assessment, treatment plan, treatment alternatives and goals were discussed and mutually agreed upon: by patient  Kayleen Memos, SPT 04/08/2022, 7:52 AM

## 2022-04-08 NOTE — Progress Notes (Addendum)
PROGRESS NOTE   Subjective/Complaints: Pt reports he had a pretty good night. +diplopia.   ROS: Patient denies fever, rash, sore throat, blurred vision, dizziness, nausea, vomiting, diarrhea, cough, shortness of breath or chest pain, joint or back/neck pain, headache, or mood change.    Objective:   No results found. Recent Labs    04/07/22 1505  WBC 10.2  HGB 12.1*  HCT 36.4*  PLT 255   Recent Labs    04/07/22 1505  NA 137  K 4.1  CL 105  CO2 23  GLUCOSE 99  BUN 34*  CREATININE 1.79*  CALCIUM 8.7*    Intake/Output Summary (Last 24 hours) at 04/08/2022 0917 Last data filed at 04/08/2022 0818 Gross per 24 hour  Intake 412 ml  Output 1150 ml  Net -738 ml        Physical Exam: Vital Signs Blood pressure (!) 129/102, pulse 71, temperature 98.7 F (37.1 C), temperature source Oral, resp. rate 18, height '5\' 9"'$  (1.753 m), weight 67.1 kg, SpO2 100 %.  General: Alert and oriented x 3, No apparent distress HEENT: Head is normocephalic, atraumatic, PERRLA, EOMI, sclera anicteric, oral mucosa pink and moist, dentition intact, ext ear canals clear,  Neck: Supple without JVD or lymphadenopathy Heart: Reg rate and rhythm. + Sys murmur. no rubs or gallops Chest: CTA bilaterally without wheezes, rales, or rhonchi; no distress Abdomen: Soft, non-tender, non-distended, bowel sounds positive. Extremities: No clubbing, cyanosis, or edema. Pulses are 2+ Psych: Pt's affect is appropriate. Pt is cooperative Skin: multiple areas of breakdown, lacerations of RUE  Neuro:  Alert and oriented x 3. Normal insight and awareness. Intact Memory. Normal language and speech. Right HH, no gross gaze abnl. RUE 4/5 (RTC injury). LUE 5/5. BLE grossly 4-/5 prox to 5/5 distally.  Senses pain and LT in all 4's. No abn tone. No gross limb ataxia. Musculoskeletal: Full ROM, No pain with AROM or PROM in the neck, trunk, or extremities. Posture  appropriate     Assessment/Plan: 1. Functional deficits which require 3+ hours per day of interdisciplinary therapy in a comprehensive inpatient rehab setting. Physiatrist is providing close team supervision and 24 hour management of active medical problems listed below. Physiatrist and rehab team continue to assess barriers to discharge/monitor patient progress toward functional and medical goals  Care Tool:  Bathing    Body parts bathed by patient: Right arm, Left arm, Chest, Abdomen, Front perineal area, Buttocks, Right upper leg, Left upper leg, Right lower leg, Face, Left lower leg         Bathing assist Assist Level: Minimal Assistance - Patient > 75%     Upper Body Dressing/Undressing Upper body dressing   What is the patient wearing?: Pull over shirt    Upper body assist Assist Level: Supervision/Verbal cueing    Lower Body Dressing/Undressing Lower body dressing      What is the patient wearing?: Pants, Underwear/pull up     Lower body assist Assist for lower body dressing: Minimal Assistance - Patient > 75%     Toileting Toileting    Toileting assist Assist for toileting: Minimal Assistance - Patient > 75%     Transfers Chair/bed transfer  Transfers assist     Chair/bed transfer assist level: Minimal Assistance - Patient > 75%     Locomotion Ambulation   Ambulation assist              Walk 10 feet activity   Assist           Walk 50 feet activity   Assist           Walk 150 feet activity   Assist           Walk 10 feet on uneven surface  activity   Assist           Wheelchair     Assist               Wheelchair 50 feet with 2 turns activity    Assist            Wheelchair 150 feet activity     Assist          Blood pressure (!) 129/102, pulse 71, temperature 98.7 F (37.1 C), temperature source Oral, resp. rate 18, height '5\' 9"'$  (1.753 m), weight 67.1 kg, SpO2 100  %.  Medical Problem List and Plan: 1. Functional deficits secondary to acute left posterior cerebral artery infarct             -patient may  shower             -ELOS/Goals: 14-16 days, supervision goals with PT, OT, SLP  -Patient is beginning CIR therapies today including PT, OT, and SLP  2.  Antithrombotics: -DVT/anticoagulation:  Pharmaceutical: Heparin             -antiplatelet therapy: aspirin 81 mg and Plavix daily for 3 months then aspirin only 3. Pain Management: Tylenol, oxycodone as needed             -gabapentin 100 mg q HS 4. Mood/Behavior/Sleep: LCSW to evaluate and provide emotional support             -antipsychotic agents: n/a 5. Neuropsych/cognition: This patient is capable of making decisions on his own behalf. 6. Skin/Wound Care: Routine skin care checks.              -local care to multiple skin tears above             -encourage appropriate nutrition 7. Fluids/Electrolytes/Nutrition: Routine Is and Os and follow-up chemistries 8: Hypertension: monitor TID and prn (home Lasix, Cozaar, Lopressor on hold)             -bp remains elevated extremely dbp  -resume lopressor at '25mg'$  bid 9: Hyperlipidemia: heart healthy diet (patient declined Crestor PTA) follow-up with PCP and neurology 10: CAD s/p NSTEMI/multiple PCI, DES: on DAPT; follows with Dr. Marlou Porch 11: Prostate cancer s/p radical proctatectomy 12: Severe MR s/p TEER: continue Plavix 13: GERD: monitor 14: CKD stage 3b: baseline creatinine 10.4-1.6; follow-up BMP    LOS: 1 days A FACE TO FACE EVALUATION WAS PERFORMED  Meredith Staggers 04/08/2022, 9:17 AM

## 2022-04-08 NOTE — Evaluation (Signed)
Occupational Therapy Assessment and Plan  Patient Details  Name: Adam Arellano MRN: [id number] Date of Birth: March 02, 1932  OT Diagnosis: ataxia, cognitive deficits, disturbance of vision, hemiplegia affecting dominant side, and muscle weakness (generalized) Rehab Potential: Rehab Potential (ACUTE ONLY): Good ELOS: 2 weeks   Today's Date: 04/08/2022 OT Individual Time: 9373-4287 OT Individual Time Calculation (min): 75 min     Hospital Problem: Principal Problem:   Acute ischemic left posterior cerebral artery (PCA) stroke ([place])   Past Medical History:  Past Medical History:  Diagnosis Date   Asthma    a. Childhood.   Coronary artery disease    a. NSTEMI s/p DES to dRCA 2007. b. Angina 07/2009: s/p DESx2 to mid RCA for ISR and prox RCA, EF 65%. c. Stress test 05/2013: low risk, no evidence of ischemia, EF 61%.   Dyslipidemia    GERD (gastroesophageal reflux disease)    History of kidney stones    Hypertension    Peripheral vascular disease (HCC)    Prostate cancer ([place])    a. s/p radical prostatectomy.   PVC's (premature ventricular contractions)    S/P mitral valve clip implantation 08/19/2020   s/p MitraClip implantation with a XTW by Dr. Burt Knack    Transient global amnesia    a. Adm 2009: imaging nonacute, had resolved.   Past Surgical History:  Past Surgical History:  Procedure Laterality Date   CARDIAC CATHETERIZATION     COLONOSCOPY     CORONARY STENT INTERVENTION N/A 08/06/2020   Procedure: CORONARY STENT INTERVENTION;  Surgeon: Sherren Mocha, MD;  Location: [facility];  Service: Cardiovascular;  Laterality: N/A;   EYE SURGERY Bilateral    cataracts removed   MASTOIDECTOMY     L ear   MITRAL VALVE REPAIR     MITRAL VALVE REPAIR N/A 08/19/2020   Procedure: MITRAL VALVE REPAIR;  Surgeon: Sherren Mocha, MD;  Location: [facility];  Service: Cardiovascular;  Laterality: N/A;   PROSTATECTOMY     RIGHT/LEFT HEART CATH AND CORONARY ANGIOGRAPHY N/A  08/06/2020   Procedure: RIGHT/LEFT HEART CATH AND CORONARY ANGIOGRAPHY;  Surgeon: Sherren Mocha, MD;  Location: [facility];  Service: Cardiovascular;  Laterality: N/A;   TEE WITHOUT CARDIOVERSION N/A 08/06/2020   Procedure: TRANSESOPHAGEAL ECHOCARDIOGRAM (TEE);  Surgeon: Sanda Klein, MD;  Location: [facility];  Service: Cardiovascular;  Laterality: N/A;   TEE WITHOUT CARDIOVERSION  08/19/2020   TEE WITHOUT CARDIOVERSION N/A 08/19/2020   Procedure: TRANSESOPHAGEAL ECHOCARDIOGRAM (TEE);  Surgeon: Sherren Mocha, MD;  Location: [facility];  Service: Cardiovascular;  Laterality: N/A;    Assessment & Plan Clinical Impression: Adam Arellano is an 88 year old male who was found down at his home my his neighbor on 04/02/2022. He presented to the [facility] ED with right-sided weakness, mild dysarthria and right hemianopsia on evaluation. Imaging was significant for left PICA infarct. No apparent injuries. Neurology consulted and resumed his Plavix and started aspirin 81 mg for 3 months then aspirin alone. Outside of window for tenecteplase and no evidence of LVO. Recommended resuming home statin; however, per PCP notes, the patient has stopped his home Crestor due to "aches".  SLP eval and noted expressive aphasia.  He is tolerating heart healthy diet. Home anti-hypertensives held to allow permissive hypertension. 2D echo with EF of 55-60%. A1c = 5.5%. The patient requires inpatient physical medicine and rehabilitation evaluations and treatment secondary to dysfunction due to left PICA infarct.   PMH: Significant for MR s/p TEER,  CAD s/p DES, CKD stage 3b, hypertension. Review of Systems  Constitutional:  Negative for chills and fever.  HENT:  Positive for hearing loss. Negative for sore throat.        Chronic throat clearing  Eyes:  Negative for blurred vision.  Respiratory:  Negative for cough and shortness of breath.   Cardiovascular:  Negative for chest pain and palpitations.   Gastrointestinal:  Positive for constipation. Negative for nausea and vomiting.  Neurological:  Negative for dizziness and headaches.  Endo/Heme/Allergies:  Bruises/bleeds easily.  Psychiatric/Behavioral:  Positive for memory loss.      Patient transferred to CIR on 04/07/2022 .    Patient currently requires min with basic self-care skills and IADL secondary to muscle weakness, decreased cardiorespiratoy endurance, ataxia, decreased coordination, and decreased motor planning, decreased visual acuity, decreased visual perceptual skills, decreased visual motor skills, and hemianopsia, decreased midline orientation, decreased attention to right, right side neglect, and decreased motor planning, decreased problem solving, decreased safety awareness, and decreased memory, and decreased sitting balance, decreased standing balance, decreased postural control, hemiplegia, and decreased balance strategies.  Prior to hospitalization, patient could complete ADLs and IADLs independently.  Patient will benefit from skilled intervention to increase independence with basic self-care skills prior to discharge home with care partner.  Anticipate patient will require 24 hour supervision and follow up home health.  OT - End of Session Activity Tolerance: Tolerates 30+ min activity with multiple rests Endurance Deficit: Yes OT Assessment Rehab Potential (ACUTE ONLY): Good OT Patient demonstrates impairments in the following area(s): Balance;Cognition;Endurance;Motor;Perception;Safety;Vision OT Basic ADL's Functional Problem(s): Grooming;Bathing;Dressing;Toileting OT Advanced ADL's Functional Problem(s): Light Housekeeping;Simple Meal Preparation OT Transfers Functional Problem(s): Toilet;Tub/Shower OT Additional Impairment(s): Fuctional Use of Upper Extremity OT Plan OT Intensity: Minimum of 1-2 x/day, 45 to 90 minutes OT Frequency: 5 out of 7 days OT Duration/Estimated Length of Stay: 2 weeks OT  Treatment/Interventions: Balance/vestibular training;DME/adaptive equipment instruction;Patient/family education;Therapeutic Activities;Therapeutic Exercise;Psychosocial support;Community reintegration;Functional mobility training;Self Care/advanced ADL retraining;UE/LE Strength taining/ROM;UE/LE Coordination activities;Neuromuscular re-education;Discharge planning;Visual/perceptual remediation/compensation OT Basic Self-Care Anticipated Outcome(s): Supervision OT Toileting Anticipated Outcome(s): Supervision OT Bathroom Transfers Anticipated Outcome(s): Supervision OT Recommendation Patient destination: Home Follow Up Recommendations: Home health OT;24 hour supervision/assistance Equipment Recommended: To be determined   OT Evaluation Precautions/Restrictions  Precautions Precautions: Fall Restrictions Weight Bearing Restrictions: No General Chart Reviewed: Yes Family/Caregiver Present: No   Home Living/Prior Functioning Home Living Family/patient expects to be discharged to:: Private residence Living Arrangements: Children Available Help at Discharge: Family Type of Home: House Home Access: Level entry Home Layout: Laundry or work area in basement, Able to live on main level with bedroom/bathroom, One level Alternate Level Stairs-Number of Steps: 15 steps to basement Alternate Level Stairs-Rails: Right, Left, Can reach both Bathroom Shower/Tub: Walk-in shower (has grab bars) Bathroom Toilet: Standard Bathroom Accessibility: Yes Additional Comments: Per daughter, he will be living with her and her husband once discharged  Lives With: Alone IADL History Homemaking Responsibilities: Yes Meal Prep Responsibility: Primary Laundry Responsibility: Primary Cleaning Responsibility: Primary Bill Paying/Finance Responsibility: Primary Shopping Responsibility: Primary Current License: Yes Mode of Transportation: Musician, Other (comment) (truck) Occupation: Retired 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 Leisure and Hobbies: enjoys working Prior Function Level of Independence: Independent with basic ADLs, Independent with gait, Independent with homemaking with ambulation, Independent with transfers  Able to Take Stairs?: Yes Driving: Yes Vocation: Retired Surveyor, mining Baseline Vision/History: 1 Wears glasses Ability to See in Adequate Light: 2 Moderately impaired Patient Visual Report: Peripheral vision impairment Vision Assessment?: Yes  Eye Alignment: Within Functional Limits Ocular Range of Motion: Within Functional Limits Tracking/Visual Pursuits: Decreased smoothness of horizontal tracking;Decreased smoothness of eye movement to RIGHT superior field;Decreased smoothness of eye movement to RIGHT inferior field Saccades: Additional eye shifts occurred during testing Convergence: Within functional limits Visual Fields: Right visual field deficit Depth Perception: Overshoots Perception  Perception: Impaired Inattention/Neglect: Does not attend to right visual field Praxis Praxis: Intact Cognition Cognition Overall Cognitive Status: Impaired/Different from baseline Arousal/Alertness: Awake/alert Orientation Level: Person;Place;Situation Person: Oriented Place: Oriented Situation: Oriented Memory: Impaired Memory Impairment: Decreased short term memory Decreased Short Term Memory: Verbal basic Attention: Focused Awareness: Impaired Reasoning: Impaired Safety/Judgment: Impaired Brief Interview for Mental Status (BIMS) Repetition of Three Words (First Attempt): 3 Temporal Orientation: Year: Correct Temporal Orientation: Month: Accurate within 5 days Temporal Orientation: Day: Correct Recall: "Sock": No, could not recall Recall: "Blue": No, could not recall Recall: "Bed": No, could not recall BIMS Summary Score: 9 Sensation Sensation Light Touch: Appears Intact Hot/Cold: Appears Intact Proprioception: Appears Intact Stereognosis: Not  tested Coordination Gross Motor Movements are Fluid and Coordinated: No Fine Motor Movements are Fluid and Coordinated: No Coordination and Movement Description: decreased coordination Finger Nose Finger Test: overshoots greater on the R side compared to L side Motor  Motor Motor: Ataxia Motor - Skilled Clinical Observations: impulsive  Trunk/Postural Assessment  Cervical Assessment Cervical Assessment: Exceptions to [facility] (forward head) Thoracic Assessment Thoracic Assessment: Exceptions to [facility] (rounded shoulders) Lumbar Assessment Lumbar Assessment: Exceptions to [facility] (posterior pelvic tilt) Postural Control Postural Control: Deficits on evaluation (decreased coordination)  Balance Balance Balance Assessed: Yes Static Sitting Balance Static Sitting - Balance Support: No upper extremity supported Static Sitting - Level of Assistance: 4: Min assist (CGA) Dynamic Sitting Balance Dynamic Sitting - Balance Support: No upper extremity supported Dynamic Sitting - Level of Assistance: 4: Min assist (CGA) Dynamic Sitting - Balance Activities: Reaching for objects Static Standing Balance Static Standing - Balance Support: Bilateral upper extremity supported Static Standing - Level of Assistance: 4: Min assist (CGA) Dynamic Standing Balance Dynamic Standing - Balance Support: Bilateral upper extremity supported;No upper extremity supported Dynamic Standing - Level of Assistance: 4: Min assist Dynamic Standing - Balance Activities: Reaching for objects Extremity/Trunk Assessment RUE Assessment RUE Assessment: Exceptions to [facility] Active Range of Motion (AROM) Comments: requires assist from the L UE to achieve full active ROM General Strength Comments: 3/5 RUE Body System: Neuro LUE Assessment LUE Assessment: Within Functional Limits  Care Tool Care Tool Self Care Eating   Eating Assist Level: Set up assist    Oral Care    Oral Care Assist Level: Minimal Assistance - Patient > 75%     Bathing   Body parts bathed by patient: Right arm;Left arm;Chest;Abdomen;Front perineal area;Buttocks;Right upper leg;Left upper leg;Right lower leg;Face;Left lower leg     Assist Level: Minimal Assistance - Patient > 75%    Upper Body Dressing(including orthotics)   What is the patient wearing?: Pull over shirt   Assist Level: Supervision/Verbal cueing    Lower Body Dressing (excluding footwear)   What is the patient wearing?: Pants;Underwear/pull up Assist for lower body dressing: Minimal Assistance - Patient > 75%    Putting on/Taking off footwear   What is the patient wearing?: Non-skid slipper socks Assist for footwear: Contact Guard/Touching assist       Care Tool Toileting Toileting activity   Assist for toileting: Minimal Assistance - Patient > 75%     Care Tool Bed Mobility Roll left and right activity  Roll left and right assist level: Contact Guard/Touching assist    Sit to lying activity   Sit to lying assist level: Contact Guard/Touching assist    Lying to sitting on side of bed activity   Lying to sitting on side of bed assist level: the ability to move from lying on the back to sitting on the side of the bed with no back support.: Contact Guard/Touching assist     Care Tool Transfers Sit to stand transfer   Sit to stand assist level: Minimal Assistance - Patient > 75%    Chair/bed transfer   Chair/bed transfer assist level: Minimal Assistance - Patient > 75%     Toilet transfer   Assist Level: Minimal Assistance - Patient > 75%     Care Tool Cognition  Expression of Ideas and Wants Expression of Ideas and Wants: 3. Some difficulty - exhibits some difficulty with expressing needs and ideas (e.g, some words or finishing thoughts) or speech is not clear  Understanding Verbal and Non-Verbal Content Understanding Verbal and Non-Verbal Content: 3. Usually understands - understands most conversations, but misses some part/intent of message. Requires cues  at times to understand   Memory/Recall Ability Memory/Recall Ability : Current season;Location of own room;That he or she is in a hospital/hospital unit   Refer to Care Plan for [place] 1 OT Short Term Goal 1 (Week 1): Pt will be CGA for LB dressing/bathing OT Short Term Goal 2 (Week 1): Pt will be CGA for functional transfers OT Short Term Goal 3 (Week 1): Pt will demonstrate improved attention to the R side with no more than 2 verbal cues OT Short Term Goal 4 (Week 1): Pt will demonstrates improved safety awareness with no more than 2 verbal cues for impulsivity OT Short Term Goal 5 (Week 1): Pt will improve MMT in R UE to 3+/5  Recommendations for other services: None    Skilled Therapeutic Intervention ADL ADL Eating: Set up Where Assessed-Eating: Bed level Grooming: Minimal assistance Where Assessed-Grooming: Standing at sink Upper Body Bathing: Supervision/safety Where Assessed-Upper Body Bathing: Sitting at sink Lower Body Bathing: Minimal assistance Where Assessed-Lower Body Bathing: Standing at sink;Sitting at sink Upper Body Dressing: Supervision/safety Where Assessed-Upper Body Dressing: Sitting at sink Lower Body Dressing: Minimal assistance Where Assessed-Lower Body Dressing: Sitting at sink;Standing at sink Toileting: Minimal assistance (for balance) Where Assessed-Toileting: Glass blower/designer: Psychiatric nurse Method: Human resources officer Method: Unable to assess Social research officer, government: Environmental education officer Method: Heritage manager: Manufacturing systems engineer  Bed Mobility Bed Mobility: Sit to Supine;Supine to Sit Supine to Sit: Contact Guard/Touching assist Sit to Supine: Contact Guard/Touching assist Transfers Sit to Stand: Minimal Assistance - Patient > 75% Stand to Sit: Minimal Assistance - Patient > 75%  Upon OT arrival, pt semi recumbent in bed  reporting no pain and is agreeable to OT session. OT evaluation initiated, educated on role of OT, and plan of care. Pt completes sponge bath ADL at the levels above. Pt limited by decreased safety awareness, impulsivity, visual perception, coordination, cognition, and balance and is functioning below his baseline. Pt would benefit from OT services to achieve highest level of independence. Pt was left in w/c at end of session with all needs met and safety measures in place.   Discharge Criteria: Patient will be discharged from OT if patient refuses treatment 3 consecutive times without medical reason, if treatment goals not met, if  there is a change in medical status, if patient makes no progress towards goals or if patient is discharged from hospital.  The above assessment, treatment plan, treatment alternatives and goals were discussed and mutually agreed upon: by patient  Marvetta Gibbons 04/08/2022, 9:39 AM

## 2022-04-08 NOTE — Progress Notes (Signed)
Physical Therapy Session Note  Patient Details  Name: Adam Arellano MRN: [id number] Date of Birth: [birth year]  Today's Date: 04/08/2022 PT Individual Time: 1420-1500 PT Individual Time Calculation (min): 40 min   Short Term Goals: Week 1:  PT Short Term Goal 1 (Week 1): Pt will perform bed mobility with mod i consistently. PT Short Term Goal 2 (Week 1): Pt will perform sit<>stand transfers with CGA and LRAD consistently. PT Short Term Goal 3 (Week 1): Pt will perform bed<>chair transfers with CGA and LRAD consistently. PT Short Term Goal 4 (Week 1): Pt will ambulate 55f with min assist and LRAD consistently. PT Short Term Goal 5 (Week 1): Pt will perform stair negotiation, 1 step, with min assist consistenly.  Skilled Therapeutic Interventions/Progress Updates:  Ambulation/gait training;Cognitive remediation/compensation;Discharge planning;DME/adaptive equipment instruction;Functional mobility training;Pain management;Psychosocial support;Splinting/orthotics;Therapeutic Activities;UE/LE Strength taining/ROM;Visual/perceptual remediation/compensation;Balance/vestibular training;Community reintegration;Disease management/prevention;Functional electrical stimulation;Neuromuscular re-education;Patient/family education;Skin care/wound management;Stair training;Therapeutic Exercise;UE/LE Coordination activities;Wheelchair propulsion/positioning   [facility]. No c/o pain,  but reports of R leg feeling weak throughout session.  Pt impulsive throughout session with ambulation, transfers, and activities that frequently resulted LOB as noted below.   Transported pt to and from room and main therapy gym [facility].   Gait training:  870fno AD and min/mod assist. Ataxia gait with increased sway, scissoring gait, various gait speed, unsteadiness with occasional LOB that required mod assist for recover, decreased R attention.   8044fo AD and  min/mod assist with cone scavenger hunt, decreased R attention with difficulty finding cones on R side of the room. Instructed pt to pick up cones with R hand and pt required frequent cuing to use R hand or to not let the L hand help the right hand, carry over of cuing was minimal. Same gait deviation as listed above and mod assist needed for occasional LOB   Transfers:  Sit<>stand [facility] performed throughout session and pt uses backs of legs to compensate for decreased strength and balance. Once standing pt has a posterior lean that requires mod to max assist for correction. Pt had several instances when standing at mat table when he would lost balance posteriorly and sit back down quickly without meaning to.   Horseshoe toss and hand on basketball hoop performed next to work on anterior weight shift, R attention, and R hand strength. Pt is very unsteady when standing without support and often leans posteriorly needing mod/max assist to recover or proceeds to sit down quickly at table. Pt also impulsive with all movements which increases unsteadiness. Horseshoe toss completed 3x8 and horseshoe hand on bball hoop net completed x4. Pt had difficulty with hanging horseshoes on the net with his right arm and would often compensate with his left arm, even after cues to correct.   Last activity included ball pick up from floor and place in hoop. Cued pt to scoot to the EOM to decrease chance for compensation of backs of legs against table. Pt first impulsive with first few sets even with cuing. However, once pt was told to slow down, he had better carry over and improved balance with sitting to standing and shooting. Performed x 10. Pt instinctively using majority L hand with shooting, cues given to use R UE as well, but minimal carry over.   Pt educated on slowing down mobility to increase safety and improve balance and pt verbalized understanding.   Pt left seated in WC with posey belt  on, call bell  in reach, all needs met.    Therapy Documentation Precautions:  Precautions Precautions: Fall Precaution Comments: R hemiparesis, R inattention, fragile skin. Restrictions Weight Bearing Restrictions: No  Therapy/Group: Individual Therapy  Kayleen Memos, SPT 04/08/2022, 3:02 PM

## 2022-04-08 NOTE — Plan of Care (Signed)
  Problem: RH Balance Goal: LTG Patient will maintain dynamic sitting balance (PT) Description: LTG:  Patient will maintain dynamic sitting balance with assistance during mobility activities (PT) Flowsheets (Taken 04/08/2022 1732) LTG: Pt will maintain dynamic sitting balance during mobility activities with:: Independent with assistive device  Goal: LTG Patient will maintain dynamic standing balance (PT) Description: LTG:  Patient will maintain dynamic standing balance with assistance during mobility activities (PT) 04/08/2022 1832 by Kayleen Memos, Student-PT Flowsheets (Taken 04/08/2022 1832) LTG: Pt will maintain dynamic standing balance during mobility activities with:: Contact Guard/Touching assist 04/08/2022 1732 by Kayleen Memos, Student-PT Flowsheets (Taken 04/08/2022 1732) LTG: Pt will maintain dynamic standing balance during mobility activities with:: Supervision/Verbal cueing Note: With LRAD   Problem: Sit to Stand Goal: LTG:  Patient will perform sit to stand with assistance level (PT) Description: LTG:  Patient will perform sit to stand with assistance level (PT) Flowsheets (Taken 04/08/2022 1732) LTG: PT will perform sit to stand in preparation for functional mobility with assistance level: Supervision/Verbal cueing Note: With LRAD   Problem: RH Bed Mobility Goal: LTG Patient will perform bed mobility with assist (PT) Description: LTG: Patient will perform bed mobility with assistance, with/without cues (PT). Flowsheets (Taken 04/08/2022 1732) LTG: Pt will perform bed mobility with assistance level of: Independent with assistive device    Problem: RH Bed to Chair Transfers Goal: LTG Patient will perform bed/chair transfers w/assist (PT) Description: LTG: Patient will perform bed to chair transfers with assistance (PT). Flowsheets (Taken 04/08/2022 1732) LTG: Pt will perform Bed to Chair Transfers with assistance level: Supervision/Verbal cueing Note: With LRAD   Problem: RH Car  Transfers Goal: LTG Patient will perform car transfers with assist (PT) Description: LTG: Patient will perform car transfers with assistance (PT). Flowsheets (Taken 04/08/2022 1732) LTG: Pt will perform car transfers with assist:: Supervision/Verbal cueing Note: With LRAD   Problem: RH Ambulation Goal: LTG Patient will ambulate in controlled environment (PT) Description: LTG: Patient will ambulate in a controlled environment, # of feet with assistance (PT). Flowsheets (Taken 04/08/2022 1732) LTG: Pt will ambulate in controlled environ  assist needed:: Contact Guard/Touching assist LTG: Ambulation distance in controlled environment: 147f With LRAD Goal: LTG Patient will ambulate in home environment (PT) Description: LTG: Patient will ambulate in home environment, # of feet with assistance (PT). Flowsheets (Taken 04/08/2022 1732) LTG: Pt will ambulate in home environ  assist needed:: Contact Guard/Touching assist LTG: Ambulation distance in home environment: 1030fWith LRAD   Problem: RH Stairs Goal: LTG Patient will ambulate up and down stairs w/assist (PT) Description: LTG: Patient will ambulate up and down # of stairs with assistance (PT) Flowsheets (Taken 04/08/2022 1732) LTG: Pt will ambulate up/down stairs assist needed:: Contact Guard/Touching assist LTG: Pt will  ambulate up and down number of stairs: 1 step with With LRAD

## 2022-04-08 NOTE — Plan of Care (Signed)
Problem: RH Balance Goal: LTG: Patient will maintain dynamic sitting balance (OT) Description: LTG:  Patient will maintain dynamic sitting balance with assistance during activities of daily living (OT) Flowsheets (Taken 04/08/2022 0949) LTG: Pt will maintain dynamic sitting balance during ADLs with: Independent Goal: LTG Patient will maintain dynamic standing with ADLs (OT) Description: LTG:  Patient will maintain dynamic standing balance with assist during activities of daily living (OT)  Flowsheets (Taken 04/08/2022 0949) LTG: Pt will maintain dynamic standing balance during ADLs with: Supervision/Verbal cueing   Problem: Sit to Stand Goal: LTG:  Patient will perform sit to stand in prep for activites of daily living with assistance level (OT) Description: LTG:  Patient will perform sit to stand in prep for activites of daily living with assistance level (OT) Flowsheets (Taken 04/08/2022 0949) LTG: PT will perform sit to stand in prep for activites of daily living with assistance level: Supervision/Verbal cueing   Problem: RH Grooming Goal: LTG Patient will perform grooming w/assist,cues/equip (OT) Description: LTG: Patient will perform grooming with assist, with/without cues using equipment (OT) Flowsheets (Taken 04/08/2022 0949) LTG: Pt will perform grooming with assistance level of: Independent   Problem: RH Bathing Goal: LTG Patient will bathe all body parts with assist levels (OT) Description: LTG: Patient will bathe all body parts with assist levels (OT) Flowsheets (Taken 04/08/2022 0949) LTG: Pt will perform bathing with assistance level/cueing: Supervision/Verbal cueing   Problem: RH Dressing Goal: LTG Patient will perform upper body dressing (OT) Description: LTG Patient will perform upper body dressing with assist, with/without cues (OT). Flowsheets (Taken 04/08/2022 0949) LTG: Pt will perform upper body dressing with assistance level of: Independent Goal: LTG Patient will  perform lower body dressing w/assist (OT) Description: LTG: Patient will perform lower body dressing with assist, with/without cues in positioning using equipment (OT) Flowsheets (Taken 04/08/2022 0949) LTG: Pt will perform lower body dressing with assistance level of: Supervision/Verbal cueing   Problem: RH Toileting Goal: LTG Patient will perform toileting task (3/3 steps) with assistance level (OT) Description: LTG: Patient will perform toileting task (3/3 steps) with assistance level (OT)  Flowsheets (Taken 04/08/2022 0949) LTG: Pt will perform toileting task (3/3 steps) with assistance level: Supervision/Verbal cueing   Problem: RH Vision Goal: RH LTG Vision Theme park manager) Flowsheets (Taken 04/08/2022 0949) LTG: Vision Goals: Pt will demonstrate improved visual perception and attention to the R side without verbal cues   Problem: RH Functional Use of Upper Extremity Goal: LTG Patient will use RT/LT upper extremity as a (OT) Description: LTG: Patient will use right/left upper extremity as a stabilizer/gross assist/diminished/nondominant/dominant level with assist, with/without cues during functional activity (OT) Flowsheets (Taken 04/08/2022 0949) LTG: Use of upper extremity in functional activities: RUE as dominant level LTG: Pt will use upper extremity in functional activity with assistance level of: Independent   Problem: RH Simple Meal Prep Goal: LTG Patient will perform simple meal prep w/assist (OT) Description: LTG: Patient will perform simple meal prep with assistance, with/without cues (OT). Flowsheets (Taken 04/08/2022 0949) LTG: Pt will perform simple meal prep with assistance level of: Supervision/Verbal cueing   Problem: RH Light Housekeeping Goal: LTG Patient will perform light housekeeping w/assist (OT) Description: LTG: Patient will perform light housekeeping with assistance, with/without cues (OT). Flowsheets (Taken 04/08/2022 0949) LTG: Pt will perform light housekeeping  with assistance level of: Supervision/Verbal cueing LTG: Pt will perform light housekeeping w/level of: Ambulate without device   Problem: RH Toilet Transfers Goal: LTG Patient will perform toilet transfers w/assist (OT) Description: LTG: Patient  will perform toilet transfers with assist, with/without cues using equipment (OT) Flowsheets (Taken 04/08/2022 0949) LTG: Pt will perform toilet transfers with assistance level of: Supervision/Verbal cueing   Problem: RH Tub/Shower Transfers Goal: LTG Patient will perform tub/shower transfers w/assist (OT) Description: LTG: Patient will perform tub/shower transfers with assist, with/without cues using equipment (OT) Flowsheets (Taken 04/08/2022 0949) LTG: Pt will perform tub/shower stall transfers with assistance level of: Supervision/Verbal cueing LTG: Pt will perform tub/shower transfers from: Walk in shower   Problem: RH Memory Goal: LTG Patient will demonstrate ability for day to day recall/carry over during activities of daily living with assistance level (OT) Description: LTG:  Patient will demonstrate ability for day to day recall/carry over during activities of daily living with assistance level (OT). Flowsheets (Taken 04/08/2022 0949) LTG:  Patient will demonstrate ability for day to day recall/carry over during activities of daily living with assistance level (OT): Supervision

## 2022-04-09 NOTE — Evaluation (Signed)
Speech Language Pathology Assessment and Plan  Patient Details  Name: Adam Arellano MRN: [id number] Date of Birth: 24-Jun-1931  SLP Diagnosis: Aphasia;Cognitive Impairments  Rehab Potential: Good ELOS: 14 days    Today's Date: 04/09/2022 SLP Individual Time: 4098-1191 SLP Individual Time Calculation (min): 60 min   Hospital Problem: Principal Problem:   Acute ischemic left posterior cerebral artery (PCA) stroke (HCC)  Past Medical History:  Past Medical History:  Diagnosis Date   Asthma    a. Childhood.   Coronary artery disease    a. NSTEMI s/p DES to dRCA 2007. b. Angina 07/2009: s/p DESx2 to mid RCA for ISR and prox RCA, EF 65%. c. Stress test 05/2013: low risk, no evidence of ischemia, EF 61%.   Dyslipidemia    GERD (gastroesophageal reflux disease)    History of kidney stones    Hypertension    Peripheral vascular disease (HCC)    Prostate cancer ([place])    a. s/p radical prostatectomy.   PVC's (premature ventricular contractions)    S/P mitral valve clip implantation 08/19/2020   s/p MitraClip implantation with a XTW by Dr. Burt Knack    Transient global amnesia    a. Adm 2009: imaging nonacute, had resolved.   Past Surgical History:  Past Surgical History:  Procedure Laterality Date   CARDIAC CATHETERIZATION     COLONOSCOPY     CORONARY STENT INTERVENTION N/A 08/06/2020   Procedure: CORONARY STENT INTERVENTION;  Surgeon: Sherren Mocha, MD;  Location: [place] [facility];  Service: Cardiovascular;  Laterality: N/A;   EYE SURGERY Bilateral    cataracts removed   MASTOIDECTOMY     L ear   MITRAL VALVE REPAIR     MITRAL VALVE REPAIR N/A 08/19/2020   Procedure: MITRAL VALVE REPAIR;  Surgeon: Sherren Mocha, MD;  Location: [place] [facility];  Service: Cardiovascular;  Laterality: N/A;   PROSTATECTOMY     RIGHT/LEFT HEART CATH AND CORONARY ANGIOGRAPHY N/A 08/06/2020   Procedure: RIGHT/LEFT HEART CATH AND CORONARY ANGIOGRAPHY;  Surgeon: Sherren Mocha, MD;  Location: [place] [facility];  Service: Cardiovascular;  Laterality: N/A;   TEE WITHOUT CARDIOVERSION N/A 08/06/2020   Procedure: TRANSESOPHAGEAL ECHOCARDIOGRAM (TEE);  Surgeon: Sanda Klein, MD;  Location: [facility];  Service: Cardiovascular;  Laterality: N/A;   TEE WITHOUT CARDIOVERSION  08/19/2020   TEE WITHOUT CARDIOVERSION N/A 08/19/2020   Procedure: TRANSESOPHAGEAL ECHOCARDIOGRAM (TEE);  Surgeon: Sherren Mocha, MD;  Location: [place] [facility];  Service: Cardiovascular;  Laterality: N/A;    Assessment / Plan / Recommendation Clinical Impression  Adam Arellano is an 88 year old male who was found down at his home my his neighbor on 04/02/2022. He presented to the [facility] ED with right-sided weakness, mild dysarthria and right hemianopsia on evaluation. Imaging was significant for left PICA infarct. No apparent injuries. Neurology consulted and resumed his Plavix and started aspirin 81 mg for 3 months then aspirin alone. Outside of window for tenecteplase and no evidence of LVO. Recommended resuming home statin; however, per PCP notes, the patient has stopped his home Crestor due to "aches".  SLP eval and noted expressive aphasia.  He is tolerating heart healthy diet. Home anti-hypertensives held to allow permissive hypertension. 2D echo with EF of 55-60%. A1c = 5.5%. The patient requires inpatient physical medicine and rehabilitation evaluations and treatment secondary to dysfunction due to left PICA infarct.  Pt was admitted to CIR and SLP evaluation was completed on 04/09/22 with results as follows:   Pt presents  with a mild expressive aphasia characterized by neologisms and intermittent word finding difficulty at the conversational level.  Pt is aware of errors but needs cues to correct them in the moment.  SLP provided skilled education regarding compensatory word finding strategies, including describing targeted words, substituting words with similar meaning words, and elimination of similar  category members.   Pt also presents with mild cognitive deficits characterized by decreased recall of new information, right inattention, decreased emergent awareness of deficits, and decreased functional problem solving.  SLP provided skilled education regarding compensatory memory strategies as pt reports this to be the most notable cognitive change post CVA, including having important information written down, keeping a calendar, repeating back information to his communication partners, and maintaining a consistent routine.   Given the abovementioned deficits, pt would benefit from skilled ST while inpatient in order to maximize functional independence and reduce burden of care prior to discharge.  Anticipate that pt will need 24/7 supervision at discharge in addition to [place] follow up at next level of care.     Skilled Therapeutic Interventions          Cognitive-linguistic evaluation completed with results and recommendations reviewed with patient.    SLP Assessment  Patient will need skilled [place] [facility] during CIR admission    Recommendations  Patient destination: Home Follow up Recommendations: 24 hour supervision/assistance;Home Health SLP Equipment Recommended: None recommended by SLP    SLP Frequency 3 to 5 out of 7 days   SLP Duration  SLP Intensity  SLP Treatment/Interventions 14 days  Minumum of 1-2 x/day, 30 to 90 minutes  Cognitive remediation/compensation;Internal/external aids;Speech/Language facilitation;Environmental controls;Patient/family education    Pain Pain Assessment Pain Scale: 0-10 Pain Score: 0-No pain  Prior Functioning Cognitive/Linguistic Baseline: Within functional limits Type of Home: House  Lives With: Alone Available Help at Discharge: Family;Available 24 hours/day Vocation: Retired  Programmer, systems Overall Cognitive Status: Impaired/Different from baseline Arousal/Alertness: Awake/alert Orientation Level:  Oriented X4 Attention: Sustained Sustained Attention: Appears intact Memory: Impaired Memory Impairment: Decreased short term memory Awareness: Impaired Awareness Impairment: Emergent impairment Problem Solving: Impaired Problem Solving Impairment: Functional basic;Verbal basic Safety/Judgment: Impaired Comments: right inattention  Comprehension Auditory Comprehension Overall Auditory Comprehension: Appears within functional limits for tasks assessed Expression Expression Primary Mode of Expression: Verbal Verbal Expression Overall Verbal Expression: Impaired Initiation: No impairment Level of Generative/Spontaneous Verbalization: Conversation Repetition: No impairment Naming: Impairment Responsive: 76-100% accurate Confrontation: Within functional limits Convergent: 75-100% accurate Divergent: 75-100% accurate Verbal Errors: Aware of errors Pragmatics: No impairment Written Expression Dominant Hand: Right Oral Motor Oral Motor/Sensory Function Overall Oral Motor/Sensory Function: Within functional limits Motor Speech Overall Motor Speech: Appears within functional limits for tasks assessed  Care Tool Care Tool Cognition Ability to hear (with hearing aid or hearing appliances if normally used Ability to hear (with hearing aid or hearing appliances if normally used): 1. Minimal difficulty - difficulty in some environments (e.g. when person speaks softly or setting is noisy)   Expression of Ideas and Wants Expression of Ideas and Wants: 3. Some difficulty - exhibits some difficulty with expressing needs and ideas (e.g, some words or finishing thoughts) or speech is not clear   Understanding Verbal and Non-Verbal Content Understanding Verbal and Non-Verbal Content: 3. Usually understands - understands most conversations, but misses some part/intent of message. Requires cues at times to understand  Memory/Recall Ability Memory/Recall Ability : Current season;Location of own  room;That he or she is in a hospital/hospital unit    Short  Term Goals: Week 1: SLP Short Term Goal 1 (Week 1): Pt will recognize and correct verbal errors at the conversational level with supervision cues SLP Short Term Goal 2 (Week 1): Pt will utilize compensatory word finding strategies as needed at the conversational level with supervision cues. SLP Short Term Goal 3 (Week 1): Pt will utilize memory compensatory aids to recall daily information with min assist. SLP Short Term Goal 4 (Week 1): Pt will complete familiar, mildly complex tasks wtih min assist for functional problem solving. SLP Short Term Goal 5 (Week 1): Pt will locate items to the right of midline during functional tasks in >75% accuracy with min assist.  Refer to Care Plan for Long Term Goals  Recommendations for other services: None   Discharge Criteria: Patient will be discharged from SLP if patient refuses treatment 3 consecutive times without medical reason, if treatment goals not met, if there is a change in medical status, if patient makes no progress towards goals or if patient is discharged from hospital.  The above assessment, treatment plan, treatment alternatives and goals were discussed and mutually agreed upon: by patient  Emilio Math 04/09/2022, 10:50 AM

## 2022-04-09 NOTE — Plan of Care (Signed)
  Problem: Consults Goal: RH STROKE PATIENT EDUCATION Description: See Patient Education module for education specifics  Outcome: Progressing   Problem: RH BOWEL ELIMINATION Goal: RH STG MANAGE BOWEL WITH ASSISTANCE Description: STG Manage Bowel with mod I  Assistance. Outcome: Progressing Goal: RH STG MANAGE BOWEL W/MEDICATION W/ASSISTANCE Description: STG Manage Bowel with Medication with mod I Assistance. Outcome: Progressing   Problem: RH BLADDER ELIMINATION Goal: RH STG MANAGE BLADDER WITH ASSISTANCE Description: STG Manage Bladder With toileting Assistance Outcome: Progressing   Problem: RH SAFETY Goal: RH STG ADHERE TO SAFETY PRECAUTIONS W/ASSISTANCE/DEVICE Description: STG Adhere to Safety Precautions With cuesAssistance/Device. Outcome: Progressing   Problem: RH KNOWLEDGE DEFICIT Goal: RH STG INCREASE KNOWLEDGE OF DIABETES Description: Patient and family will be able to manage DM with medications and dietary modifications using handouts and educational resources independently Outcome: Progressing Goal: RH STG INCREASE KNOWLEDGE OF HYPERTENSION Description: Patient and family will be able to manage HTN with medications and dietary modifications using handouts and educational resources independently Outcome: Progressing Goal: RH STG INCREASE KNOWLEGDE OF HYPERLIPIDEMIA Description: Patient and family will be able to manage HLD with medications and dietary modifications using handouts and educational resources independently Outcome: Progressing Goal: RH STG INCREASE KNOWLEDGE OF STROKE PROPHYLAXIS Description: Patient and family will be able to manage secondary risks with medications and dietary modifications using handouts and educational resources independently Outcome: Progressing   Problem: Education: Goal: Knowledge of disease or condition will improve Outcome: Progressing Goal: Knowledge of secondary prevention will improve (MUST DOCUMENT ALL) Outcome:  Progressing Goal: Knowledge of patient specific risk factors will improve Elta [place] N/A or DELETE if not current risk factor) Outcome: Progressing   Problem: Ischemic Stroke/TIA Tissue Perfusion: Goal: Complications of ischemic stroke/TIA will be minimized Outcome: Progressing   Problem: Coping: Goal: Will verbalize positive feelings about self Outcome: Progressing Goal: Will identify appropriate support needs Outcome: Progressing   Problem: Health Behavior/Discharge Planning: Goal: Ability to manage health-related needs will improve Outcome: Progressing Goal: Goals will be collaboratively established with patient/family Outcome: Progressing   Problem: Self-Care: Goal: Ability to participate in self-care as condition permits will improve Outcome: Progressing Goal: Verbalization of feelings and concerns over difficulty with self-care will improve Outcome: Progressing Goal: Ability to communicate needs accurately will improve Outcome: Progressing   Problem: Nutrition: Goal: Risk of aspiration will decrease Outcome: Progressing Goal: Dietary intake will improve Outcome: Progressing

## 2022-04-09 NOTE — Progress Notes (Signed)
PROGRESS NOTE   Subjective/Complaints: No new problems overnight. Doesn't have denture cream to keep teeth secure during breakfast. Says therapy went well yesterday  ROS: Patient denies fever, rash, sore throat, blurred vision, dizziness, nausea, vomiting, diarrhea, cough, shortness of breath or chest pain, joint or back/neck pain, headache, or mood change.    Objective:   No results found. Recent Labs    04/07/22 1505  WBC 10.2  HGB 12.1*  HCT 36.4*  PLT 255   Recent Labs    04/07/22 1505  NA 137  K 4.1  CL 105  CO2 23  GLUCOSE 99  BUN 34*  CREATININE 1.79*  CALCIUM 8.7*    Intake/Output Summary (Last 24 hours) at 04/09/2022 0828 Last data filed at 04/09/2022 0815 Gross per 24 hour  Intake 597 ml  Output 400 ml  Net 197 ml        Physical Exam: Vital Signs Blood pressure (!) 135/90, pulse 77, temperature 98.8 F (37.1 C), temperature source Oral, resp. rate 18, height '5\' 9"'$  (1.753 m), weight 67.1 kg, SpO2 94 %.  Constitutional: No distress . Vital signs reviewed. HEENT: NCAT, EOMI, oral membranes moist Neck: supple Cardiovascular: RRR with sys murmur. No JVD    Respiratory/Chest: CTA Bilaterally without wheezes or rales. Normal effort    GI/Abdomen: BS +, non-tender, non-distended Ext: no clubbing, cyanosis, or edema Psych: pleasant and cooperative  Skin: multiple areas of breakdown, lacerations of RUE  Neuro:  Alert and oriented x 3. Normal insight and awareness. Intact Memory. Normal language and speech. Right HH, no gross gaze abnl. RUE 4/5 (RTC injury). LUE 5/5. BLE grossly 4-/5 prox to 5/5 distally.  Senses pain and LT in all 4's. No abn tone. No gross limb ataxia. Musculoskeletal: Full ROM, No pain with AROM or PROM in the neck, trunk, or extremities. Posture appropriate     Assessment/Plan: 1. Functional deficits which require 3+ hours per day of interdisciplinary therapy in a comprehensive  inpatient rehab setting. Physiatrist is providing close team supervision and 24 hour management of active medical problems listed below. Physiatrist and rehab team continue to assess barriers to discharge/monitor patient progress toward functional and medical goals  Care Tool:  Bathing    Body parts bathed by patient: Right arm, Left arm, Chest, Abdomen, Front perineal area, Buttocks, Right upper leg, Left upper leg, Right lower leg, Face, Left lower leg         Bathing assist Assist Level: Minimal Assistance - Patient > 75%     Upper Body Dressing/Undressing Upper body dressing   What is the patient wearing?: Pull over shirt    Upper body assist Assist Level: Supervision/Verbal cueing    Lower Body Dressing/Undressing Lower body dressing      What is the patient wearing?: Pants, Underwear/pull up     Lower body assist Assist for lower body dressing: Minimal Assistance - Patient > 75%     Toileting Toileting    Toileting assist Assist for toileting: Minimal Assistance - Patient > 75%     Transfers Chair/bed transfer  Transfers assist     Chair/bed transfer assist level: Minimal Assistance - Patient > 75%  Locomotion Ambulation   Ambulation assist      Assist level: Moderate Assistance - Patient 50 - 74% Assistive device: Other (comment) (hallway hand rail) Max distance: 30   Walk 10 feet activity   Assist     Assist level: Moderate Assistance - Patient - 50 - 74% Assistive device: Other (comment) (hallway handrail)   Walk 50 feet activity   Assist Walk 50 feet with 2 turns activity did not occur: Safety/medical concerns         Walk 150 feet activity   Assist Walk 150 feet activity did not occur: Safety/medical concerns         Walk 10 feet on uneven surface  activity   Assist     Assist level: Moderate Assistance - Patient - 50 - 74% Assistive device: Other (comment) (ramp handrail)   Wheelchair     Assist Is the  patient using a wheelchair?: Yes (transported pt to and from main therapy gym for time management) Type of Wheelchair: Manual    Wheelchair assist level: Dependent - Patient 0% Max wheelchair distance: 114f    Wheelchair 50 feet with 2 turns activity    Assist        Assist Level: Dependent - Patient 0%   Wheelchair 150 feet activity     Assist      Assist Level: Dependent - Patient 0%   Blood pressure (!) 135/90, pulse 77, temperature 98.8 F (37.1 C), temperature source Oral, resp. rate 18, height '5\' 9"'$  (1.753 m), weight 67.1 kg, SpO2 94 %.  Medical Problem List and Plan: 1. Functional deficits secondary to acute left posterior cerebral artery infarct             -patient may  shower             -ELOS/Goals: 14-16 days, supervision goals with PT, OT, SLP  -Continue CIR therapies including PT, OT, and SLP   2.  Antithrombotics: -DVT/anticoagulation:  Pharmaceutical: Heparin             -antiplatelet therapy: aspirin 81 mg and Plavix daily for 3 months then aspirin only 3. Pain Management: Tylenol, oxycodone as needed             -gabapentin 100 mg q HS 4. Mood/Behavior/Sleep: LCSW to evaluate and provide emotional support             -antipsychotic agents: n/a 5. Neuropsych/cognition: This patient is capable of making decisions on his own behalf. 6. Skin/Wound Care: Routine skin care checks.              -local care to multiple skin tears above             -encourage appropriate nutrition  -asked nurse if we could find some denture cream for him 7. Fluids/Electrolytes/Nutrition: Routine Is and Os and follow-up chemistries 8: Hypertension: monitor TID and prn (home Lasix, Cozaar, Lopressor on hold)             -bp remains elevated extremely dbp  -resumed lopressor at '25mg'$  bid  -monitor DBP for pattern 9: Hyperlipidemia: heart healthy diet (patient declined Crestor PTA) follow-up with PCP and neurology 10: CAD s/p NSTEMI/multiple PCI, DES: on DAPT; follows with  Dr. SMarlou Porch11: Prostate cancer s/p radical proctatectomy  -seems to be voiding without issues 12: Severe MR s/p TEER: continue Plavix 13: GERD: monitor 14: CKD stage 3b: baseline creatinine 10.4-1.6; follow-up BMP    LOS: 2 days A FACE TO FACE EVALUATION WAS  PERFORMED  Meredith Staggers 04/09/2022, 8:28 AM

## 2022-04-10 LAB — BASIC METABOLIC PANEL
Anion gap: 9 (ref 5–15)
BUN: 38 mg/dL — ABNORMAL HIGH (ref 8–23)
CO2: 22 mmol/L (ref 22–32)
Calcium: 8.7 mg/dL — ABNORMAL LOW (ref 8.9–10.3)
Chloride: 106 mmol/L (ref 98–111)
Creatinine, Ser: 1.69 mg/dL — ABNORMAL HIGH (ref 0.61–1.24)
GFR, Estimated: 39 mL/min — ABNORMAL LOW (ref >60)
Glucose, Bld: 89 mg/dL (ref 70–99)
Potassium: 4.4 mmol/L (ref 3.5–5.1)
Sodium: 137 mmol/L (ref 135–145)

## 2022-04-10 LAB — CBC
HCT: 38.6 % — ABNORMAL LOW (ref 39.0–52.0)
Hemoglobin: 12.4 g/dL — ABNORMAL LOW (ref 13.0–17.0)
MCH: 31.3 pg (ref 26.0–34.0)
MCHC: 32.1 g/dL (ref 30.0–36.0)
MCV: 97.5 fL (ref 80.0–100.0)
Platelets: 272 10*3/uL (ref 150–400)
RBC: 3.96 MIL/uL — ABNORMAL LOW (ref 4.22–5.81)
RDW: 12.8 % (ref 11.5–15.5)
WBC: 10.7 10*3/uL — ABNORMAL HIGH (ref 4.0–10.5)
nRBC: 0 % (ref 0.0–0.2)

## 2022-04-10 MED ORDER — SODIUM CHLORIDE 0.45 % IV SOLN: INTRAVENOUS | Status: DC

## 2022-04-10 NOTE — Progress Notes (Signed)
Inpatient Rehabilitation Care Coordinator Assessment and Plan Patient Details  Name: Adam Arellano MRN: [id number] Date of Birth: July 18, 1931  Today's Date: 04/10/2022  Hospital Problems: Principal Problem:   Acute ischemic left posterior cerebral artery (PCA) stroke [facility])  Past Medical History:  Past Medical History:  Diagnosis Date   Asthma    a. Childhood.   Coronary artery disease    a. NSTEMI s/p DES to dRCA 2007. b. Angina 07/2009: s/p DESx2 to mid RCA for ISR and prox RCA, EF 65%. c. Stress test 05/2013: low risk, no evidence of ischemia, EF 61%.   Dyslipidemia    GERD (gastroesophageal reflux disease)    History of kidney stones    Hypertension    Peripheral vascular disease (HCC)    Prostate cancer ([place])    a. s/p radical prostatectomy.   PVC's (premature ventricular contractions)    S/P mitral valve clip implantation 08/19/2020   s/p MitraClip implantation with a XTW by Dr. Burt Knack    Transient global amnesia    a. Adm 2009: imaging nonacute, had resolved.   Past Surgical History:  Past Surgical History:  Procedure Laterality Date   CARDIAC CATHETERIZATION     COLONOSCOPY     CORONARY STENT INTERVENTION N/A 08/06/2020   Procedure: CORONARY STENT INTERVENTION;  Surgeon: Sherren Mocha, MD;  Location: [place] CV [facility];  Service: Cardiovascular;  Laterality: N/A;   EYE SURGERY Bilateral    cataracts removed   MASTOIDECTOMY     L ear   MITRAL VALVE REPAIR     MITRAL VALVE REPAIR N/A 08/19/2020   Procedure: MITRAL VALVE REPAIR;  Surgeon: Sherren Mocha, MD;  Location: [place] [facility];  Service: Cardiovascular;  Laterality: N/A;   PROSTATECTOMY     RIGHT/LEFT HEART CATH AND CORONARY ANGIOGRAPHY N/A 08/06/2020   Procedure: RIGHT/LEFT HEART CATH AND CORONARY ANGIOGRAPHY;  Surgeon: Sherren Mocha, MD;  Location: [place] [facility];  Service: Cardiovascular;  Laterality: N/A;   TEE WITHOUT CARDIOVERSION N/A 08/06/2020   Procedure: TRANSESOPHAGEAL ECHOCARDIOGRAM (TEE);   Surgeon: Sanda Klein, MD;  Location: [facility] ENDOSCOPY;  Service: Cardiovascular;  Laterality: N/A;   TEE WITHOUT CARDIOVERSION  08/19/2020   TEE WITHOUT CARDIOVERSION N/A 08/19/2020   Procedure: TRANSESOPHAGEAL ECHOCARDIOGRAM (TEE);  Surgeon: Sherren Mocha, MD;  Location: [place] [facility];  Service: Cardiovascular;  Laterality: N/A;   Social History:  reports that he quit smoking about 43 years ago. His smoking use included cigarettes. He has never used smokeless tobacco. He reports that he does not currently use alcohol. He reports that he does not use drugs.  Family / Support Systems Children: Adam Arellano Anticipated Caregiver: Adam Arellano Ability/Limitations of Caregiver: MIN A Caregiver Availability: 24/7 Family Dynamics: support form daughter and family  Social History Preferred language: English Religion: Curator - How often do you need to have someone help you when you read instructions, pamphlets, or other written material from your doctor or pharmacy?: Never Writes: Yes   Abuse/Neglect Abuse/Neglect Assessment Can Be Completed: Yes Physical Abuse: Denies Verbal Abuse: Denies Sexual Abuse: Denies Exploitation of patient/patient's resources: Denies Self-Neglect: Denies  Patient response to: Social Isolation - How often do you feel lonely or isolated from those around you?: Never  Emotional Status Recent Psychosocial Issues: n/a Psychiatric History: n/a Substance Abuse History: n/a  Patient / Family Perceptions, Expectations & Goals Pt/Family understanding of illness & functional limitations: yes Premorbid pt/family roles/activities: independent and active Anticipated changes in roles/activities/participation: Daughter able to assist up to [facility]  A at discharge. D/c home with daughter and family. Pt/family expectations/goals: Min/Sup  [employer]: None Premorbid Home Care/DME Agencies: Other (Comment) Doctor, hospital, shower seat) Transportation available at discharge: daughter able to transport Is the patient able to respond to transportation needs?: Yes In the past 12 months, has lack of transportation kept you from medical appointments or from getting medications?: No In the past 12 months, has lack of transportation kept you from meetings, work, or from getting things needed for daily living?: No Resource referrals recommended: Neuropsychology  Discharge Planning Living Arrangements: Children Support Systems: Children Type of Residence: Private residence Insurance Resources: Multimedia programmer (specify) ([place]) Financial Resources: Family Support Financial Screen Referred: No Living Expenses: Lives with family Money Management: Patient, Family Does the patient have any problems obtaining your medications?: No Home Management: Independent Patient/Family Preliminary Plans: Daughter and family able to help manage if needed. Care Coordinator Barriers to Discharge: Insurance for SNF coverage, Home environment access/layout Care Coordinator Anticipated Follow Up Needs: [place] Additional Notes/Comments: Patient discharging to live with daughter and her husband at discharge. Expected length of stay: 14-16 Days  Clinical Impression Sw met with patient and daughter, Adam Arellano. Introduced self and explained role. Patient will discharge home with daughter and SIL to assist and care. Daughter ia patient's primary contact. No additional questions or concerns.   Dyanne Iha 04/10/2022, 12:22 PM

## 2022-04-10 NOTE — Progress Notes (Signed)
Inpatient Rehabilitation  Patient information reviewed and entered into eRehab system by Kaybree Williams Jimmy Plessinger, OTR/L, Rehab Quality Coordinator.   Information including medical coding, functional ability and quality indicators will be reviewed and updated through discharge.   

## 2022-04-10 NOTE — Progress Notes (Signed)
Occupational Therapy Session Note  Patient Details  Name: Adam Arellano MRN: [id number] Date of Birth: April 02, 1932  Today's Date: 04/10/2022 OT Individual Time: 0900-1015 OT Individual Time Calculation (min): 75 min    Short Term Goals: Week 1:  OT Short Term Goal 1 (Week 1): Pt will be CGA for LB dressing/bathing OT Short Term Goal 2 (Week 1): Pt will be CGA for functional transfers OT Short Term Goal 3 (Week 1): Pt will demonstrate improved attention to the R side with no more than 2 verbal cues OT Short Term Goal 4 (Week 1): Pt will demonstrates improved safety awareness with no more than 2 verbal cues for impulsivity OT Short Term Goal 5 (Week 1): Pt will improve MMT in R UE to 3+/5  Skilled Therapeutic Interventions/Progress Updates:     Pt greeted resting in bed receptive to participation in skilled OT session with a focus on ADL retraining and functional use of RUE. Pt reporting no pain during OT session today.  Pt bed mobility supine>sitting EOB min A with HOB elevated. Pt stand step to wc min A with RW d/t posterior and R lateral lean. Pt Pt transported total A to bathroom. Pt stand pivot to elevated toilet seat min A RW. Pt completed toileting with mod A for clothing management (continent void). Pt ambulated to shower chair with RW min A with moderate verbal cueing for safety. Pt completed shower using RUE as a helper hand. Pt impulsive in reaching for his feet, trying to pick up self-care items from the ground, and standing to wash his bottom requiring moderate verbal cues. Pt noted to frequently drop items with his R hand, but he is motivated to use the RUE during functional tasks. Pt completed UB bathing CGA and LB bathing min A. Pt educated on bathroom safety and using grab bars during functional transfers to wc.  Pt completed UB dressing seated in wc with min A. Pt able to follow verbal cues for hemi dressing technique. Need for further education d/t memory impairment. Pt  completed LB dressing seated in wc crossing R/L legs in figure four position to don feet into pants. Pt pulled pants to his waist in standing requiring mod A to maintain balance d/t posterior propulsion.  Pt completed grooming brushing his teeth and applying deodorant at sink. Items presented to pt on R side to increase awareness and facilitate visual scanning to R side.  Pt ambulated to EOB to complete dynamic siting balance PNF exercises. Pt completed 2x10 reps of diagonal reaching exercises with therapists hands as targets. Pt was left resting in bed with his call bell in reach, bed alarm on, and all needs met.   Therapy Documentation Precautions:  Precautions Precautions: Fall Precaution Comments: R hemiparesis, R inattention, fragile skin. Restrictions Weight Bearing Restrictions: No   ADL: ADL Eating: Set up Where Assessed-Eating: Bed level Grooming: Minimal assistance Where Assessed-Grooming: Standing at sink Upper Body Bathing: Supervision/safety Where Assessed-Upper Body Bathing: Sitting at sink Lower Body Bathing: Minimal assistance Where Assessed-Lower Body Bathing: Standing at sink, Sitting at sink Upper Body Dressing: Supervision/safety Where Assessed-Upper Body Dressing: Sitting at sink Lower Body Dressing: Minimal assistance Where Assessed-Lower Body Dressing: Sitting at sink, Standing at sink Toileting: Minimal assistance (for balance) Where Assessed-Toileting: Glass blower/designer: Psychiatric nurse Method: Human resources officer Method: Unable to assess Social research officer, government: Environmental education officer Method: Heritage manager: Radio broadcast assistant   Therapy/Group: Individual Therapy  Janey Genta 04/10/2022, 10:10  AM

## 2022-04-10 NOTE — Progress Notes (Signed)
Physical Therapy Session Note  Patient Details  Name: Adam Arellano MRN: [id number] Date of Birth: 05-Aug-1931  Today's Date: 04/10/2022 PT Individual Time: 2956-2130 PT Individual Time Calculation (min): 72 min   Short Term Goals: Week 1:  PT Short Term Goal 1 (Week 1): Pt will perform bed mobility with mod i consistently. PT Short Term Goal 2 (Week 1): Pt will perform sit<>stand transfers with CGA and LRAD consistently. PT Short Term Goal 3 (Week 1): Pt will perform bed<>chair transfers with CGA and LRAD consistently. PT Short Term Goal 4 (Week 1): Pt will ambulate 26f with min assist and LRAD consistently. PT Short Term Goal 5 (Week 1): Pt will perform stair negotiation, 1 step, with min assist consistenly.  Skilled Therapeutic Interventions/Progress Updates:      NT assisting patient to bathroom on arrival. Daughter outside room and time spent educating her on PT goals, PT POC, and gathering information for DC planning. Daughter reports the plan is to DC to patient's home with daughter providing 24/7 care. After a few days, the plan is to then go to her home for her husband and herself to provider 24/7 care. Daughter reports patient is very independent and she hopes that he will reach high level goals prior to DC - requests that therapy "pushes" him appropriately.   Handoff of care to PT with patient sitting in w/c Transported to main rehab gym for time management.  Gait training during session: -2072fwith RW and minA. R hand supinating off of RW frequently, pt with decreased awareness resulting in consistent LOB to the R. Removed RW for remaining gait trials to challenge balance and progress towards LRAD.  -20032fith L HHA and minA. -100f40fth no AD and minA -100ft19fh no AD and minA -125ft 68f no AD and minA -125ft w62fno AD and minA. R knee buckling towards end of gait trial related to fatigue.  *Mildly ataxic on R while ambulating, narrow BOS and downward gaze.  Trendelenburg ?bilaterally with occasional R hip pain. Increased lateral trunk sway L<>R. Forward flexed trunk, difficulty achieving full upright - suspect this could be premorbid.  -25ft si42ftepping L<>R x6 bouts, modA stepping R.   Stair training using 6inch steps and 2 hand rails. Up/down x12 steps with minA overall. Step to pattern while forward facing. Decreased awareness of foot placement with only forefoot on step at times.   Standing there-ex in // bars -1x15 alternating high knees -1x20 PF heel raises -1x10 hip abduction -1x10 hamstring curls *Pt with impaired motor control on R with decreased graded movements, ataxic.  *MinA needed for balance and steadying during there-ex. Posterior bias and hip collapse bilaterally.   Nustep activity at L5 resistance using BLE/BUE to challenge reciprocal movement patterns, controlled/graded movements, and general strengthening. Pt fatigues after 3 minutes.   Returned to room and patient remained seated in recliner with daughter at bedside - updated on pt's mobility and tolerance to therapy session. All needs met at end.  Therapy Documentation Precautions:  Precautions Precautions: Fall Precaution Comments: R hemiparesis, R inattention, fragile skin. Restrictions Weight Bearing Restrictions: No General:     Therapy/Group: Individual Therapy  Allah Reason P Adam Arellano PT 04/10/2022, 7:42 AM

## 2022-04-10 NOTE — IPOC Note (Signed)
Overall Plan of Care [facility]) Patient Details Name: Adam Arellano MRN: [id number] DOB: 09/17/1931  Admitting Diagnosis: Acute ischemic left posterior cerebral artery (PCA) stroke [facility])  Hospital Problems: Principal Problem:   Acute ischemic left posterior cerebral artery (PCA) stroke ([place])     Functional Problem List: Nursing Bladder, Bowel, Endurance, Medication Management, Pain, Safety, Skin Integrity  PT Balance, Behavior, Edema, Endurance, Motor, Pain, Perception, Safety, Sensory, Skin Integrity  OT Balance, Cognition, Endurance, Motor, Perception, Safety, Vision  SLP Linguistic, Cognition  TR         Basic ADL's: OT Grooming, Bathing, Dressing, Toileting     Advanced  ADL's: OT Light Housekeeping, Simple Meal Preparation     Transfers: PT Bed Mobility, Bed to Chair, Teacher, early years/pre, Tub/Shower     Locomotion: PT Ambulation, Stairs     Additional Impairments: OT Fuctional Use of Upper Extremity  SLP Communication, Social Cognition expression Problem Solving, Memory, Awareness  TR      Anticipated Outcomes Item Anticipated Outcome  Self Feeding    Swallowing      Basic self-care  Supervision  Toileting  Supervision   Bathroom Transfers Supervision  Bowel/Bladder  manage bowel w mod I assist and bladder w toileting  Transfers  SPV  Locomotion  CGA  Communication  mod I  Cognition  supervision  Pain  < 4 with prns  Safety/Judgment  manage safety w cues   Therapy Plan: PT Intensity: Minimum of 1-2 x/day ,45 to 90 minutes PT Frequency: 5 out of 7 days PT Duration Estimated Length of Stay: 14 days OT Intensity: Minimum of 1-2 x/day, 45 to 90 minutes OT Frequency: 5 out of 7 days OT Duration/Estimated Length of Stay: 2 weeks SLP Intensity: Minumum of 1-2 x/day, 30 to 90 minutes SLP Frequency: 3 to 5 out of 7 days SLP Duration/Estimated Length of Stay: 14 days   Team Interventions: Nursing Interventions Bladder Management, Medication  Management, Patient/Family Education, Bowel Management, Skin Care/Wound Management, Disease Management/Prevention, Pain Management, Discharge Planning  PT interventions Ambulation/gait training, Cognitive remediation/compensation, Discharge planning, DME/adaptive equipment instruction, Functional mobility training, Pain management, Psychosocial support, Splinting/orthotics, Therapeutic Activities, UE/LE Strength taining/ROM, Visual/perceptual remediation/compensation, Training and development officer, Community reintegration, Disease management/prevention, Technical sales engineer stimulation, Neuromuscular re-education, Patient/family education, Skin care/wound management, Stair training, Therapeutic Exercise, UE/LE Coordination activities, Wheelchair propulsion/positioning  OT Interventions Training and development officer, Engineer, drilling, Patient/family education, Therapeutic Activities, Therapeutic Exercise, Psychosocial support, Community reintegration, Functional mobility training, Self Care/advanced ADL retraining, UE/LE Strength taining/ROM, UE/LE Coordination activities, Neuromuscular re-education, Discharge planning, Visual/perceptual remediation/compensation  SLP Interventions Cognitive remediation/compensation, Internal/external aids, Speech/Language facilitation, Environmental controls, Patient/family education  TR Interventions    SW/CM Interventions Discharge Planning, Psychosocial Support, Patient/Family Education, Disease Management/Prevention   Barriers to Discharge MD  Medical stability  Nursing Decreased caregiver support, Home environment access/layout 2 level main B+B bil rails w daughter  PT Wound Care    OT      SLP      SW Insurance for SNF coverage, Home environment access/layout     Team Discharge Planning: Destination: PT-Home (daughter's home) ,OT- Home , SLP-Home Projected Follow-up: PT-Home health PT, 24 hour supervision/assistance, OT-  Home health OT, 24  hour supervision/assistance, SLP-24 hour supervision/assistance, Home Health SLP Projected Equipment Needs: PT-To be determined, OT- To be determined, SLP-None recommended by SLP Equipment Details: PT- , OT-  Patient/family involved in discharge planning: PT- Patient,  OT-Patient, SLP-Patient  MD ELOS: 14-16d Medical Rehab Prognosis:  Good Assessment: The patient has been admitted for  CIR therapies with the diagnosis of Left PCA infarct. The team will be addressing functional mobility, strength, stamina, balance, safety, adaptive techniques and equipment, self-care, bowel and bladder mgt, patient and caregiver education, NEEDING IVF FOR HYDRATION , MONITOR RENAL STATUS. Goals have been set at sup/Mod I. Anticipated discharge destination is Home .        See Team Conference Notes for weekly updates to the plan of care

## 2022-04-10 NOTE — Progress Notes (Signed)
Patient ID: Adam Arellano, male   DOB: 28-Sep-1931, 88 y.o.   MRN: [id number] Met with the patient to review current situation, rehab process, team conference and plan of care. Patient noted he "had not had major health concerns" prior to the stroke; hx of HTN, HLD (no statin due to leg pain) and CAD. Discussed heart healthy diet and medications. Reported would need to review with his daughter as she would be going home with him at discharge. Reviewed DAPT x 3 months per MD then ASA solo. Discussed baking and broiling over frying. Hx of prostate issues; incontinent with pull ups PTA. Reported stroke affected his memory but he cannot recollect all that was affected by the stroke. Continue to follow along to discharge to address educational needs to facilitate preparation for discharge. Margarito Liner

## 2022-04-10 NOTE — Progress Notes (Signed)
PROGRESS NOTE   Subjective/Complaints:  No issues overnite , no pain , does not recall having therapy over the weekend but notes indicate SLP yesterday, PT, OT, SLP on Sat  ROS: Patient denies fever, rash, sore throat, blurred vision, dizziness, nausea, vomiting, diarrhea, cough, shortness of breath or chest pain, joint or back/neck pain, headache, or mood change.    Objective:   No results found. Recent Labs    04/07/22 1505 04/10/22 0534  WBC 10.2 10.7*  HGB 12.1* 12.4*  HCT 36.4* 38.6*  PLT 255 272    Recent Labs    04/07/22 1505 04/10/22 0534  NA 137 137  K 4.1 4.4  CL 105 106  CO2 23 22  GLUCOSE 99 89  BUN 34* 38*  CREATININE 1.79* 1.69*  CALCIUM 8.7* 8.7*     Intake/Output Summary (Last 24 hours) at 04/10/2022 0100 Last data filed at 04/10/2022 0742 Gross per 24 hour  Intake 840 ml  Output --  Net 840 ml         Physical Exam: Vital Signs Blood pressure (!) 146/64, pulse 63, temperature 97.9 F (36.6 C), temperature source Oral, resp. rate 20, height '5\' 9"'$  (1.753 m), weight 69.4 kg, SpO2 97 %.   General: No acute distress Mood and affect are appropriate Heart: Regular rate and rhythm no rubs murmurs or extra sounds Lungs: Clear to auscultation, breathing unlabored, no rales or wheezes Abdomen: Positive bowel sounds, soft nontender to palpation, nondistended Extremities: No clubbing, cyanosis, or edema Skin: Right supra orbital ecchymosis   Neuro:  Alert and oriented x 3. Normal insight and awareness. Intact Memory. Normal language and speech. Right HH, no gross gaze abnl. RUE 4/5 (RTC injury). LUE 5/5. BLE grossly 4-/5 prox to 5/5 distally.  Senses pain and LT in all 4's. No abn tone. No gross limb ataxia. Musculoskeletal:reduced ROM Right shoulder    Assessment/Plan: 1. Functional deficits which require 3+ hours per day of interdisciplinary therapy in a comprehensive inpatient rehab  setting. Physiatrist is providing close team supervision and 24 hour management of active medical problems listed below. Physiatrist and rehab team continue to assess barriers to discharge/monitor patient progress toward functional and medical goals  Care Tool:  Bathing    Body parts bathed by patient: Right arm, Left arm, Chest, Abdomen, Front perineal area, Buttocks, Right upper leg, Left upper leg, Right lower leg, Face, Left lower leg         Bathing assist Assist Level: Minimal Assistance - Patient > 75%     Upper Body Dressing/Undressing Upper body dressing   What is the patient wearing?: Pull over shirt    Upper body assist Assist Level: Supervision/Verbal cueing    Lower Body Dressing/Undressing Lower body dressing      What is the patient wearing?: Pants, Underwear/pull up     Lower body assist Assist for lower body dressing: Minimal Assistance - Patient > 75%     Toileting Toileting    Toileting assist Assist for toileting: Minimal Assistance - Patient > 75%     Transfers Chair/bed transfer  Transfers assist     Chair/bed transfer assist level: Minimal Assistance - Patient > 75%  Locomotion Ambulation   Ambulation assist      Assist level: Moderate Assistance - Patient 50 - 74% Assistive device: Other (comment) (hallway hand rail) Max distance: 30   Walk 10 feet activity   Assist     Assist level: Moderate Assistance - Patient - 50 - 74% Assistive device: Other (comment) (hallway handrail)   Walk 50 feet activity   Assist Walk 50 feet with 2 turns activity did not occur: Safety/medical concerns         Walk 150 feet activity   Assist Walk 150 feet activity did not occur: Safety/medical concerns         Walk 10 feet on uneven surface  activity   Assist     Assist level: Moderate Assistance - Patient - 50 - 74% Assistive device: Other (comment) (ramp handrail)   Wheelchair     Assist Is the patient using a  wheelchair?: Yes (transported pt to and from main therapy gym for time management) Type of Wheelchair: Manual    Wheelchair assist level: Dependent - Patient 0% Max wheelchair distance: 153f    Wheelchair 50 feet with 2 turns activity    Assist        Assist Level: Dependent - Patient 0%   Wheelchair 150 feet activity     Assist      Assist Level: Dependent - Patient 0%   Blood pressure (!) 146/64, pulse 63, temperature 97.9 F (36.6 C), temperature source Oral, resp. rate 20, height '5\' 9"'$  (1.753 m), weight 69.4 kg, SpO2 97 %.  Medical Problem List and Plan: 1. Functional deficits secondary to acute left posterior cerebral artery infarct             -patient may  shower             -ELOS/Goals: 14-16 days, supervision goals with PT, OT, SLP  -Continue CIR therapies including PT, OT, and SLP   2.  Antithrombotics: -DVT/anticoagulation:  Pharmaceutical: Heparin             -antiplatelet therapy: aspirin 81 mg and Plavix daily for 3 months then aspirin only 3. Pain Management: Tylenol, oxycodone as needed             -gabapentin 100 mg q HS 4. Mood/Behavior/Sleep: LCSW to evaluate and provide emotional support             -antipsychotic agents: n/a 5. Neuropsych/cognition: This patient is capable of making decisions on his own behalf. 6. Skin/Wound Care: Routine skin care checks.              -local care to multiple skin tears above             -encourage appropriate nutrition  -asked nurse if we could find some denture cream for him 7. Fluids/Electrolytes/Nutrition: Routine Is and Os and follow-up chemistries 8: Hypertension: monitor TID and prn (home Lasix, Cozaar, Lopressor on hold)             -bp remains elevated extremely dbp  -resumed lopressor at '25mg'$  bid   Vitals:   04/09/22 1930 04/10/22 0539  BP: (!) 147/75 (!) 146/64  Pulse: 71 63  Resp: 18 20  Temp: 98.9 F (37.2 C) 97.9 F (36.6 C)  SpO2: 98% 97%    9: Hyperlipidemia: heart healthy diet  (patient declined Crestor PTA) follow-up with PCP and neurology 10: CAD s/p NSTEMI/multiple PCI, DES: on DAPT; follows with Dr. SMarlou Porch11: Prostate cancer s/p radical proctatectomy  -seems to  be voiding without issues 12: Severe MR s/p TEER: continue Plavix 13: GERD: monitor 14: CKD stage 3b: baseline creatinine 10.4-1.6; follow-up BMP      Latest Ref Rng & Units 04/10/2022    5:34 AM 04/07/2022    3:05 PM 04/05/2022    4:07 AM  BMP  Glucose 70 - 99 mg/dL 89  99  108   BUN 8 - 23 mg/dL 38  34  30   Creatinine 0.61 - 1.24 mg/dL 1.69  1.79  1.50   Sodium 135 - 145 mmol/L 137  137  139   Potassium 3.5 - 5.1 mmol/L 4.4  4.1  3.6   Chloride 98 - 111 mmol/L 106  105  108   CO2 22 - 32 mmol/L '22  23  24   '$ Calcium 8.9 - 10.3 mg/dL 8.7  8.7  8.3   BUN trending up creat relatively stable start IVF at noc if intake <1053m during the day    LOS: 3 days [place] 04/10/2022, 8:12 AM

## 2022-04-10 NOTE — Progress Notes (Signed)
[facility] Individual Statement of Services  Patient Name:  Adam Arellano  Date:  04/10/2022  Welcome to the [facility].  Our goal is to provide you with an individualized program based on your diagnosis and situation, designed to meet your specific needs.  With this comprehensive rehabilitation program, you will be expected to participate in at least 3 hours of rehabilitation therapies Monday-Friday, with modified therapy programming on the weekends.  Your rehabilitation program will include the following services:  Physical Therapy (PT), Occupational Therapy (OT), Speech Therapy (ST), 24 hour per day rehabilitation nursing, Therapeutic Recreaction (TR), Neuropsychology, Care Coordinator, Rehabilitation Medicine, Nutrition Services, Pharmacy Services, and Other  Weekly team conferences will be held on Wednesdays to discuss your progress.  Your Inpatient Rehabilitation Care Coordinator will talk with you frequently to get your input and to update you on team discussions.  Team conferences with you and your family in attendance may also be held.  Expected length of stay:  14-16 Days  Overall anticipated outcome:  Supervision  Depending on your progress and recovery, your program may change. Your Inpatient Rehabilitation Care Coordinator will coordinate services and will keep you informed of any changes. Your Inpatient Rehabilitation Care Coordinator's name and contact numbers are listed  below.  The following services may also be recommended but are not provided by the [facility]:   [place] will be made to provide these services after discharge if needed.  Arrangements include referral to agencies that provide these services.  Your insurance has been verified to be:   [facility] MEDICARE Your primary doctor is:  Lajean Manes, MD  Pertinent information  will be shared with your doctor and your insurance company.  Inpatient Rehabilitation Care Coordinator:  Erlene Quan, [place] or [phone]  Information discussed with and copy given to patient by: Dyanne Iha, 04/10/2022, 11:48 AM

## 2022-04-10 NOTE — Progress Notes (Signed)
Speech Language Pathology Daily Session Note  Patient Details  Name: Adam Arellano MRN: [id number] Date of Birth: 06-Aug-1931  Today's Date: 04/10/2022 SLP Individual Time: 0459-9774 SLP Individual Time Calculation (min): 59 min  Short Term Goals: Week 1: SLP Short Term Goal 1 (Week 1): Pt will recognize and correct verbal errors at the conversational level with supervision cues SLP Short Term Goal 2 (Week 1): Pt will utilize compensatory word finding strategies as needed at the conversational level with supervision cues. SLP Short Term Goal 3 (Week 1): Pt will utilize memory compensatory aids to recall daily information with min assist. SLP Short Term Goal 4 (Week 1): Pt will complete familiar, mildly complex tasks wtih min assist for functional problem solving. SLP Short Term Goal 5 (Week 1): Pt will locate items to the right of midline during functional tasks in >75% accuracy with min assist.  Skilled Therapeutic Interventions:Skilled ST services focused on language skills. SLP facilitated medication management skills in BID pill organizer task. Pt required max A verbal cues for problem solving and error awareness but was able to correct errors with min A verbal cues. Pt demonstrated difficulty reading medication list, pt supported due to blurred / double vision, but not noted when looking at objects. SLP administered subsections of RCBA, pt demonstrated 80% accuracy in word reading comprehension and 60% accuracy in decoding accuracy. SLP will continue to assess reading deficits verse vision deficits. Pt was left in room with call bell within reach and chair alarm set. SLP recommends to continue skilled services.     Pain Pain Assessment Pain Score: 0-No pain  Therapy/Group: Individual Therapy  Taniqua Issa  [place] Bledsoe 04/10/2022, 2:44 PM

## 2022-04-11 NOTE — Discharge Summary (Signed)
Physician Discharge Summary  Patient ID: Adam Arellano MRN: [id number] DOB/AGE: 12/15/1931 88 y.o.  Admit date: 04/07/2022 Discharge date: 04/21/2022  Discharge Diagnoses:  Principal Problem:   Acute ischemic left posterior cerebral artery (PCA) stroke ([facility]) Active Problems:   Primary hypertension   Stage 3b chronic kidney disease ([facility]) Functional deficits secondary to acute ischemic left posterior cerebral artery stroke Multiple skin tears Hyperlipidemia Coronary artery disease Prostate cancer Mitral regurgitation GERD Diplopia consipation  Discharged Condition: good  Significant Diagnostic Studies:  Labs:  Basic Metabolic Panel: Recent Labs  Lab 04/14/22 1458 04/17/22 0516 04/20/22 0638  NA 137 140 136  K 4.4 4.4 4.2  CL 111 110 108  CO2 19* 22 22  GLUCOSE 108* 98 100*  BUN 40* 30* 30*  CREATININE 1.86* 1.52* 1.81*  CALCIUM 8.6* 8.9 8.7*    CBC: Recent Labs  Lab 04/17/22 0516  WBC 7.8  HGB 12.2*  HCT 36.6*  MCV 95.3  PLT 307     Brief HPI:   Adam Arellano is a 88 y.o. male  who was found down at his home my his neighbor on 04/02/2022. He presented to the [facility] ED with right-sided weakness, mild dysarthria and right hemianopsia on evaluation. Imaging was significant for left PICA infarct. No apparent injuries. Neurology consulted and resumed his Plavix and started aspirin 81 mg for 3 months then aspirin alone. Outside of window for tenecteplase and no evidence of LVO. Recommended resuming home statin; however, per PCP notes, the patient has stopped his home Crestor due to "aches".  SLP eval and noted expressive aphasia.  He is tolerating heart healthy diet. Home anti-hypertensives held to allow permissive hypertension. 2D echo with EF of 55-60%. A1c = 5.5%.   Hospital Course: Adam Arellano was admitted to rehab 04/07/2022 for inpatient therapies to consist of PT, ST and OT at least three hours five days a week. Past admission physiatrist,  therapy team and rehab RN have worked together to provide customized collaborative inpatient rehab. Serum creatinine intially lower but trended upward to 1.86 from 11/6 to 11/10 along with increase in BUN. Treated with nighttime IVFs: NS at 75 cc/hr. Scr down to 1.52 on 11/13. Noted to have mild pre-tibial edema on 11/14, therefore NS discontinued and Lasix 10 mg daily started on 11/14. Lasix discontinued on discharge as serum creatinine up to 1.81. Also, patient complaining of constipation and given Dulcolax suppository. Continue Colace at home BID.  Blood pressures were monitored on TID basis and Lopressor 25 mg BID resumed on 11/4. Cozaar 25 mg daily resumed on 11/8.  Lasix restarted but discontinued due to slight rise in serum creatinine.  Rehab course: During patient's stay in rehab weekly team conferences were held to monitor patient's progress, set goals and discuss barriers to discharge. At admission, patient required min assist with basic self-care skills and SPV for mobility.  He has had improvement in activity tolerance, balance, postural control as well as ability to compensate for deficits. He has had improvement in functional use RUE  and RLE as well as improvement in awareness. He has met 14 of 14 long term OT goals.  Pt performed sit<>stand and stand pivot transfers using RW throughout session with supervision especially for impulsivity and RW mgmt.  Pt ambulated >150 ft x3 using RW with supervision and w/c follow for fatigue. Demonstrated intermittent forward placement of RW requiring vc/ tc for improved proximity to walker.    Disposition: Home Discharge disposition: 01-Home or Self Care  Diet: Heart healthy  Special Instructions: No driving, alcohol consumption or tobacco use.  Follow-up with PCP in 1-2 weeks for recheck of BP/meds, serum creatinine and potassium level.  30-35 minutes were spent on discharge planning and discharge summary.  Discharge Instructions      Ambulatory referral to Neurology   Complete by: As directed    An appointment is requested in approximately: 4 weeks   Ambulatory referral to Physical Medicine Rehab   Complete by: As directed    Hospital follow-up   Discharge patient   Complete by: As directed    Discharge disposition: 01-Home or Self Care   Discharge patient date: 04/21/2022      Allergies as of 04/21/2022       Reactions   Simvastatin Other (See Comments)   MUSCLE ACHES REAL BAD   Atorvastatin    Other reaction(s): stiffness, decreased energy   Lisinopril Cough   Rosuvastatin    Other reaction(s): weakness        Medication List     STOP taking these medications    furosemide 40 MG tablet Commonly known as: LASIX       TAKE these medications    acetaminophen 325 MG tablet Commonly known as: TYLENOL Take 1-2 tablets (325-650 mg total) by mouth every 4 (four) hours as needed for mild pain.   aspirin EC 81 MG tablet Take 81 mg by mouth daily. Swallow whole.   ciprofloxacin-dexamethasone OTIC suspension Commonly known as: CIPRODEX Place 4 drops into the left ear 2 (two) times daily as needed (ear infection).   clopidogrel 75 MG tablet Commonly known as: PLAVIX Take 1 tablet (75 mg total) by mouth daily.   docusate sodium 100 MG capsule Commonly known as: COLACE Take 1 capsule (100 mg total) by mouth 2 (two) times daily.   FIBER PO Take 1 capsule by mouth daily.   gabapentin 100 MG capsule Commonly known as: NEURONTIN Take 1 capsule (100 mg total) by mouth at bedtime.   losartan 25 MG tablet Commonly known as: COZAAR TAKE 1 TABLET DAILY   metoprolol tartrate 25 MG tablet Commonly known as: LOPRESSOR Take 25 mg by mouth 2 (two) times daily.   multivitamin with minerals Tabs tablet Take 1 tablet by mouth daily.   Muscle Rub 10-15 % Crea Apply 1 Application topically 2 (two) times daily as needed for muscle pain.   nitroGLYCERIN 0.4 MG SL tablet Commonly known as:  NITROSTAT PLACE 1 TABLET UNDER THE TONGUE AT ONSET OF CHEST PAIN EVERY 5 MINTUES UP TO 3 TIMES AS NEEDED What changed:  how much to take how to take this when to take this reasons to take this additional instructions   polyethylene glycol 17 g packet Commonly known as: MIRALAX / GLYCOLAX Take 17 g by mouth daily as needed for mild constipation.   REFRESH OP Place 1 drop into both eyes daily as needed (dry eye).        Follow-up Information     Kirsteins, Luanna Salk, MD Follow up.   Specialty: Physical Medicine and Rehabilitation Why: office will call you to arrange your appt (sent) Contact information: [place] [address] [phone]         Lajean Manes, MD Follow up.   Specialty: Internal Medicine Why: Call in 1-2 days to make hospital follow-up appointment. Need appointment within 1-2 weeks and follow-up lab work (potassium level, kideney function) Contact information: [address] [place] [address] [place] [address] [phone]  [facility] Follow up.   Why: Call in 1-2 days to make hospital follow-up appointment Contact information: [address]     [address] [place] [address] [phone]        Jerline Pain, MD. Go to.   Specialty: Cardiology Why: 12/15 Contact information: [address] [place] [address] [phone]                 Signed: Barbie Banner 04/21/2022, 10:07 AM

## 2022-04-11 NOTE — Progress Notes (Signed)
Physical Therapy Session Note  Patient Details  Name: Adam Arellano MRN: [id number] Date of Birth: 04-16-1932  Today's Date: 04/11/2022 PT Individual Time: 0904-1001 and 1304 - 1351  PT Individual Time Calculation (min): 57 min and 47 min   Short Term Goals: Week 1:  PT Short Term Goal 1 (Week 1): Pt will perform bed mobility with mod i consistently. PT Short Term Goal 2 (Week 1): Pt will perform sit<>stand transfers with CGA and LRAD consistently. PT Short Term Goal 3 (Week 1): Pt will perform bed<>chair transfers with CGA and LRAD consistently. PT Short Term Goal 4 (Week 1): Pt will ambulate 66f with min assist and LRAD consistently. PT Short Term Goal 5 (Week 1): Pt will perform stair negotiation, 1 step, with min assist consistenly.  Skilled Therapeutic Interventions/Progress Updates:     Session 1:  Pt greeted supine in bed and agreeable to therapy. No c/o pain.  Pt continues presenting with impulsive behavior throughout session, but responds well to cuing.  Sup>sit with SPV.  Static sitting at EOB with SPV,  Dynamic sitting at EOB while dressing from SPV-mod assist.  Sit<>stand from EOB with pt using L UE support on bed railing with CGA. Pt able to doff brief in standing and perform pericare with min/mod assist for balance due to pts posterior lean.  Mod assist with donning pants and brief.   Gait training:  1615fwith R HHA  8060fith L HHA  Min/mod assist for balance. Ataxic gait pattern. Trendelenburg on the R. Pt c/o R hip weakness and giving out with occasional right hip buckling.  Cues for improved upright posture and fwd gaze.   Sit<> stand from EOM to no AD x 5 with pt initally compensating using the backs of his legs against the table. Frequent cues given to scoot to EOM and not use legs to brace himself. Pt has increased difficulty without compensation and has difficulty maintaining erect standing balance once coming into standing due to posterior lean. Mod assist  is needed to assist with standing balance with pt having difficulty with anterior weight shifting.   // bars L LE march to increase stance and mm activation on R LE. Mod assist for balance, pt using B UE support on bars. Presenting with continued R trendelenburg when lifting up L LE. Pt unable to perform slowly or with control and has continued giving out at the R hip.   Mat exercises performed to increase glute max and medius strength.  Bridging 3x10 with cues to keep R knee from falling in and pt able to correct.  Clamshells 20x2 bilaterally with cues needed for correct form.   Attempted R LE stance via step tap in front of a mirror. 4 inch step with pt instructed to tap L foot on step while maintaining upright posture. Pt impulsively stepped up into step with both feet, proceeds to lose balance and grabs for rolling mirror to hold on to. Mod assist needed for correction with cues to slowly step off and sit back at table.  Step tap attempted again with clearer instructions given and pt able to follow. Pt unable to maintain standing on R LE without mod assist with pt leaning to the right on SPT and unable to bring self back upright.   Pt amb [id number] to room with R HHA, no AD and min/mod assist and same deviations and cues given above.   Pt left seated in WC with brakes locked, call bell in reach, posey  belt on, all needs met.   Session 2:  Pt greeted seated in [facility] and agreeable to therapy. No c/o pain at beginning of session.   Transported pt to and from main therapy gym via [facility] for time management.   Blocked practice of sit<> stand performed today to work on technique, sequencing, limb placement, and fwd trunk lean.  Began with having pt sitting EOM with red kickball on the ground in front of him. Instructed pt to bend fwd, grab the ball with both hands, stand up, find upright posture, bend fwd, place ball on the floor, and sit down. Pt performed x 8 with CGA/min assist for balance due to pt  leaning posteriorly in standing even with cues to anterior weight shift.  Progressed to having kickball on chair in front of pt for increased anterior weight shift, which he performed well x10 with improved technique and progressed to CGA.  Continued progressing activity with the folllowing:  -Sit <> stand with 2kg weighted ball on chair x 10 - sit <> stand with L foot elevated on airex pad to promote R LE weightbearing x10 and  -Sit<> stand with L foot elevated on airex pad and kickball on chair x 10, progressed to green weighted ball (2kg) x 10  Pt carrying over of anterior weight shifting well and required only CGA upon standing consistently.   Gait training:  Attempted gait training with R HHA and min/mod assist. Pt amb 20 ft before stating his right hip felt like it was going to give out and pt needed to sit down. +2 assist to bring up chair behind pt.   Assessment of right hip in supine showed increased mm guarding of the right hip especially with min-mod IR and ER, moderate hip flexion, and pt unable to lie R LE flat without pain in supine. Pt unable to describe the pain in terms of type, deep or surface level, or specific area.   Proceeded with supine therex with red theraband loop around the knees to promote glute max and med activation.  Supine clam with red theraband 2x20 with frequent cues to increase eccentric control and not let R knee cave/adduct into the L knee. Pt able to follow cues for a few reps at a time.  Bridge with hip abd and red theraband 2x20   Sit<>stand with red theraband around knees, instructions to push out into band and not allow knees to cave in, x10 with CGA and good anterior weight shift even without cues of bending fwd for a ball.   Pt left seated in WC with call bell in reach, posey belt on, brakes on, friend present, all needs met.   Therapy Documentation Precautions:  Precautions Precautions: Fall Precaution Comments: R hemiparesis, R inattention,  fragile skin. Restrictions Weight Bearing Restrictions: No   Therapy/Group: Individual Therapy  Kayleen Memos, SPT 04/11/2022, 8:03 AM

## 2022-04-11 NOTE — Progress Notes (Signed)
PROGRESS NOTE   Subjective/Complaints:  Pt unaware of RIght field cut, discussed stroke location and effects   ROS: Patient denies CP, SOB, N/V/D  Objective:   No results found. Recent Labs    04/10/22 0534  WBC 10.7*  HGB 12.4*  HCT 38.6*  PLT 272    Recent Labs    04/10/22 0534  NA 137  K 4.4  CL 106  CO2 22  GLUCOSE 89  BUN 38*  CREATININE 1.69*  CALCIUM 8.7*     Intake/Output Summary (Last 24 hours) at 04/11/2022 0745 Last data filed at 04/11/2022 0733 Gross per 24 hour  Intake 1390.55 ml  Output 150 ml  Net 1240.55 ml         Physical Exam: Vital Signs Blood pressure (!) 167/105, pulse 66, temperature 97.7 F (36.5 C), temperature source Oral, resp. rate 20, height '5\' 9"'$  (1.753 m), weight 69.4 kg, SpO2 99 %.   General: No acute distress Mood and affect are appropriate Heart: Regular rate and rhythm no rubs murmurs or extra sounds Lungs: Clear to auscultation, breathing unlabored, no rales or wheezes Abdomen: Positive bowel sounds, soft nontender to palpation, nondistended Extremities: No clubbing, cyanosis, or edema Skin: Right supra orbital ecchymosis   Neuro:  Alert and oriented x 3. Normal insight and awareness. Intact Memory. Normal language and speech.Dense Right HH, . RUE 4/5 (RTC injury). LUE 5/5. BLE grossly 4-/5 prox to 5/5 distally.  Senses pain and LT in all 4's. No abn tone. Mild dysmetria R FNF Musculoskeletal:reduced ROM Right shoulder    Assessment/Plan: 1. Functional deficits which require 3+ hours per day of interdisciplinary therapy in a comprehensive inpatient rehab setting. Physiatrist is providing close team supervision and 24 hour management of active medical problems listed below. Physiatrist and rehab team continue to assess barriers to discharge/monitor patient progress toward functional and medical goals  Care Tool:  Bathing    Body parts bathed by patient:  Right arm, Left arm, Chest, Abdomen, Front perineal area, Buttocks, Right upper leg, Left upper leg, Right lower leg, Face, Left lower leg         Bathing assist Assist Level: Minimal Assistance - Patient > 75%     Upper Body Dressing/Undressing Upper body dressing   What is the patient wearing?: Pull over shirt    Upper body assist Assist Level: Supervision/Verbal cueing    Lower Body Dressing/Undressing Lower body dressing      What is the patient wearing?: Pants, Underwear/pull up     Lower body assist Assist for lower body dressing: Minimal Assistance - Patient > 75%     Toileting Toileting    Toileting assist Assist for toileting: Minimal Assistance - Patient > 75%     Transfers Chair/bed transfer  Transfers assist     Chair/bed transfer assist level: Minimal Assistance - Patient > 75%     Locomotion Ambulation   Ambulation assist      Assist level: Moderate Assistance - Patient 50 - 74% Assistive device: Other (comment) (hallway hand rail) Max distance: 30   Walk 10 feet activity   Assist     Assist level: Moderate Assistance - Patient - 17 -  74% Assistive device: Other (comment) (hallway handrail)   Walk 50 feet activity   Assist Walk 50 feet with 2 turns activity did not occur: Safety/medical concerns         Walk 150 feet activity   Assist Walk 150 feet activity did not occur: Safety/medical concerns         Walk 10 feet on uneven surface  activity   Assist     Assist level: Moderate Assistance - Patient - 50 - 74% Assistive device: Other (comment) (ramp handrail)   Wheelchair     Assist Is the patient using a wheelchair?: Yes (transported pt to and from main therapy gym for time management) Type of Wheelchair: Manual    Wheelchair assist level: Dependent - Patient 0% Max wheelchair distance: 142f    Wheelchair 50 feet with 2 turns activity    Assist        Assist Level: Dependent - Patient 0%    Wheelchair 150 feet activity     Assist      Assist Level: Dependent - Patient 0%   Blood pressure (!) 167/105, pulse 66, temperature 97.7 F (36.5 C), temperature source Oral, resp. rate 20, height '5\' 9"'$  (1.753 m), weight 69.4 kg, SpO2 99 %.  Medical Problem List and Plan: 1. Functional deficits secondary to acute left posterior cerebral artery infarct             -patient may  shower             -ELOS/Goals: 14-16 days, supervision goals with PT, OT, SLP, team conf in am   -Continue CIR therapies including PT, OT, and SLP   2.  Antithrombotics: -DVT/anticoagulation:  Pharmaceutical: Heparin             -antiplatelet therapy: aspirin 81 mg and Plavix daily for 3 months then aspirin only 3. Pain Management: Tylenol, oxycodone as needed             -gabapentin 100 mg q HS 4. Mood/Behavior/Sleep: LCSW to evaluate and provide emotional support             -antipsychotic agents: n/a 5. Neuropsych/cognition: This patient is capable of making decisions on his own behalf. 6. Skin/Wound Care: Routine skin care checks.              -local care to multiple skin tears above             -encourage appropriate nutrition  -asked nurse if we could find some denture cream for him 7. Fluids/Electrolytes/Nutrition: Routine Is and Os and follow-up chemistries 8: Hypertension: monitor TID and prn (home Lasix, Cozaar, Lopressor on hold)             -bp remains elevated extremely dbp  -resumed lopressor at '25mg'$  bid   Vitals:   04/10/22 1947 04/11/22 0429  BP: (!) 162/83 (!) 167/105  Pulse: 72 66  Resp: 16 20  Temp: 97.7 F (36.5 C) 97.7 F (36.5 C)  SpO2: 98% 99%   Permissive HTN, review BMET anin am decide on adding back Cozaar  9: Hyperlipidemia: heart healthy diet (patient declined Crestor PTA) follow-up with PCP and neurology 10: CAD s/p NSTEMI/multiple PCI, DES: on DAPT; follows with Dr. SMarlou Porch11: Prostate cancer s/p radical proctatectomy  -seems to be voiding without issues 12:  Severe MR s/p TEER: continue Plavix 13: GERD: monitor 14: CKD stage 3b: baseline creatinine 10.4-1.6; follow-up BMP      Latest Ref Rng & Units 04/10/2022  5:34 AM 04/07/2022    3:05 PM 04/05/2022    4:07 AM  BMP  Glucose 70 - 99 mg/dL 89  99  108   BUN 8 - 23 mg/dL 38  34  30   Creatinine 0.61 - 1.24 mg/dL 1.69  1.79  1.50   Sodium 135 - 145 mmol/L 137  137  139   Potassium 3.5 - 5.1 mmol/L 4.4  4.1  3.6   Chloride 98 - 111 mmol/L 106  105  108   CO2 22 - 32 mmol/L '22  23  24   '$ Calcium 8.9 - 10.3 mg/dL 8.7  8.7  8.3   BUN trending up creat relatively stable start IVF at noc if intake <1083m during the day  6155mIVF recorded with 66055mo yesterday  LOS: 4 days A FACE TO FACE EVALUATION WAS PERFORMED  AndCharlett Blake/12/2021, 7:45 AM

## 2022-04-11 NOTE — Progress Notes (Signed)
Occupational Therapy Session Note  Patient Details  Name: Adam Arellano MRN: [id number] Date of Birth: November 30, 1931  Today's Date: 04/11/2022 OT Individual Time: 1100-1200 OT Individual Time Calculation (min): 60 min  OT Individual Time: 1445-1530 OT Individual Time Calculation (min): 45 min     Short Term Goals: Week 1:  OT Short Term Goal 1 (Week 1): Pt will be CGA for LB dressing/bathing OT Short Term Goal 2 (Week 1): Pt will be CGA for functional transfers OT Short Term Goal 3 (Week 1): Pt will demonstrate improved attention to the R side with no more than 2 verbal cues OT Short Term Goal 4 (Week 1): Pt will demonstrates improved safety awareness with no more than 2 verbal cues for impulsivity OT Short Term Goal 5 (Week 1): Pt will improve MMT in R UE to 3+/5  Skilled Therapeutic Interventions/Progress Updates:     Session 1: Pt received in room resting in wc in good spirits and motivated to participate in therapy session with a focus on ADL retraining. Pt reporting no pain during session. Pt agreeable to shower and BADLs. Pt ambulated to bathroom min HHA and min A to guide hips d/t R lean and posterior propulsion. Pt completed toileting mod A for clothing management and posterior peri care. Pt completed U/LB bathing seated on shower chair with min A to wash bottom. During shower, Pt noted to be impulsive requiring moderate verbal cues to slow pace and remain seated during shower. Pt demonstrating increased use of RUE during task. Pt ambulated to EOB min A to complete dressing tasks. Donned shirt with set-up A and pants with mod A to weave feet. Pt able to pull pants to waist in standing with CGA to maintain balance. Pt donned socks and shoes while seated reaching anteriorly towards the floor. Pt able to tie shoes when given increased time.  Pt completed grooming tasks brushing teeth and hair standing at sink with CGA. Pt required mod V cues to hold onto sink to maintain balance and to pace  self during activity for safety. Pt ambulated to wc min A and was left resting in wc with call bell in reach, seat belt alarm on, and all needs met.   Session 2: Pt received resting in wc motivated to participate in skilled OT session. Pt transported total A to therapy gym to participate in therapeutic activities with a focus on dynamic standing balance and improving functional use of RUE.  Pt completed bean bag toss activity while completing sit>stands without AD. Pt sit<>stand CGA with moderate verbal cues for body mechanics and positioning. In standing, Pt presenting with significant posterior propulsion. Mod A to maintain balance. Pt educated on weight shifting and provided cues for slowing pace with noted improvement min A to maintain dynamic balance while throwing bean bags. Pt able to grasp bean bags in RUE without dropping throughout task.  Pt ambulated to elevated table to complete BUE task to improve strength and coordination of RUE movements. Pt crated pipe structure from picture with min A. Pt able to pick up light weight piping pieces and perform in hand manipulation to shift pieces to fit on structure. Pt noted to drop pieces ~25% of the time.  Pt was transported back to room total A in wc to address toileting needs.Pt completed toileting on BCS over standard toilet with mod A for clothing management and posterior peri care. Pt ambulated to sink min HHA to wash hands. Pt impulsive throughout session attempting to perform all activities  quickly with noted improvement when provided moderate verbal cues. Pt demonstrating decreased safety awareness during functional tasks. Pt was left resting in his wc with call bell in reach, chair alarm on, and all needs met.     Therapy Documentation Precautions:  Precautions Precautions: Fall Precaution Comments: R hemiparesis, R inattention, fragile skin. Restrictions Weight Bearing Restrictions: No  Pain: Pain Assessment Pain Scale: 0-10 Pain Score:  0-No pain ADL: ADL Eating: Set up Where Assessed-Eating: Bed level Grooming: Minimal assistance Where Assessed-Grooming: Standing at sink Upper Body Bathing: Supervision/safety Where Assessed-Upper Body Bathing: Sitting at sink Lower Body Bathing: Minimal assistance Where Assessed-Lower Body Bathing: Standing at sink, Sitting at sink Upper Body Dressing: Supervision/safety Where Assessed-Upper Body Dressing: Sitting at sink Lower Body Dressing: Minimal assistance Where Assessed-Lower Body Dressing: Sitting at sink, Standing at sink Toileting: Minimal assistance (for balance) Where Assessed-Toileting: Glass blower/designer: Psychiatric nurse Method: Human resources officer Method: Unable to assess Social research officer, government: Environmental education officer Method: Heritage manager: Radio broadcast assistant  Therapy/Group: Individual Therapy  Janey Genta 04/11/2022, 11:51 AM

## 2022-04-12 LAB — BASIC METABOLIC PANEL
Anion gap: 7 (ref 5–15)
BUN: 38 mg/dL — ABNORMAL HIGH (ref 8–23)
CO2: 22 mmol/L (ref 22–32)
Calcium: 8.7 mg/dL — ABNORMAL LOW (ref 8.9–10.3)
Chloride: 109 mmol/L (ref 98–111)
Creatinine, Ser: 1.75 mg/dL — ABNORMAL HIGH (ref 0.61–1.24)
GFR, Estimated: 37 mL/min — ABNORMAL LOW (ref >60)
Glucose, Bld: 100 mg/dL — ABNORMAL HIGH (ref 70–99)
Potassium: 4.4 mmol/L (ref 3.5–5.1)
Sodium: 138 mmol/L (ref 135–145)

## 2022-04-12 MED ORDER — MUSCLE RUB 10-15 % EX CREA
TOPICAL_CREAM | Freq: Two times a day (BID) | CUTANEOUS | Status: DC | PRN
  Filled 2022-04-12: qty 85

## 2022-04-12 MED ORDER — LOSARTAN POTASSIUM 25 MG PO TABS
25.0000 mg | ORAL_TABLET | Freq: Every day | ORAL | Status: DC
  Administered 2022-04-13 – 2022-04-21 (×9): 25 mg via ORAL
  Filled 2022-04-12 (×9): qty 1

## 2022-04-12 MED ORDER — TROLAMINE SALICYLATE 10 % EX CREA: TOPICAL_CREAM | Freq: Two times a day (BID) | CUTANEOUS | Status: DC | PRN

## 2022-04-12 NOTE — Progress Notes (Signed)
Occupational Therapy Session Note  Patient Details  Name: Adam Arellano MRN: [id number] Date of Birth: [birth year]  Today's Date: 04/12/2022 OT Individual Time: 6433-2951 OT Individual Time Calculation (min): 45 min    Short Term Goals: Week 1:  OT Short Term Goal 1 (Week 1): Pt will be CGA for LB dressing/bathing OT Short Term Goal 2 (Week 1): Pt will be CGA for functional transfers OT Short Term Goal 3 (Week 1): Pt will demonstrate improved attention to the R side with no more than 2 verbal cues OT Short Term Goal 4 (Week 1): Pt will demonstrates improved safety awareness with no more than 2 verbal cues for impulsivity OT Short Term Goal 5 (Week 1): Pt will improve MMT in R UE to 3+/5  Skilled Therapeutic Interventions/Progress Updates:     Pt received resting in his wc dressed and ready for the day. Pt receptive to skilled OT session reporting no pain. Pt transported to therapy gym total A to participate in skilled OT session.  Therapeutic Activity:  -Pt ambulated from wc>EOM with RW min A with mod vc for safety d/t posterior propulsion and R lean. Pt completed sit<>stands x12 from EOM Min to mod A posterior support provided during functional activity. Pt able pinch squigz and place them on vertical mirror at eye level x12 and pull them off without dropping. Pt placed and removed squigz form bottom of mirror while sitting to work on Advertising account planner with CGA provide when lifting trunk. -Pt completed FM therapeutic task placing and removing medium sized pegs into peg board while sitting EOM. Pt able to maintain sitting balance with close supervision. Pegs presented in Pt R visual field at eye level to facilitate increased attention to R side and functional reaching. Pt required min verbal cues for to manipulate pegs in his hand. Pt able to reach 90-100 degrees to obtain pegs requiring min support at shoulder to lift arm to eye level. Pt noted to drop pegs <25% of the time. Pt  transported back to room total A in wc and left resting in his wc with call bell in reach, seat belt alarm on, and all needs met.   Therapy Documentation Precautions:  Precautions Precautions: Fall Precaution Comments: R hemiparesis, R inattention, fragile skin. Restrictions Weight Bearing Restrictions: No  Pain: Pain Assessment Pain Scale: 0-10 Pain Score: 0-No pain ADL: ADL Eating: Set up Where Assessed-Eating: Bed level Grooming: Minimal assistance Where Assessed-Grooming: Standing at sink Upper Body Bathing: Supervision/safety Where Assessed-Upper Body Bathing: Sitting at sink Lower Body Bathing: Minimal assistance Where Assessed-Lower Body Bathing: Standing at sink, Sitting at sink Upper Body Dressing: Supervision/safety Where Assessed-Upper Body Dressing: Sitting at sink Lower Body Dressing: Minimal assistance Where Assessed-Lower Body Dressing: Sitting at sink, Standing at sink Toileting: Minimal assistance (for balance) Where Assessed-Toileting: Glass blower/designer: Psychiatric nurse Method: Human resources officer Method: Unable to assess Social research officer, government: Environmental education officer Method: Heritage manager: Radio broadcast assistant  Therapy/Group: Individual Therapy  Janey Genta 04/12/2022, 10:03 AM

## 2022-04-12 NOTE — Progress Notes (Signed)
Occupational Therapy Session Note  Patient Details  Name: Adam Arellano MRN: [id number] Date of Birth: 04-26-1932  Today's Date: 04/12/2022 OT Individual Time: 1341-1406 OT Individual Time Calculation (min): 25 min    Short Term Goals: Week 1:  OT Short Term Goal 1 (Week 1): Pt will be CGA for LB dressing/bathing OT Short Term Goal 2 (Week 1): Pt will be CGA for functional transfers OT Short Term Goal 3 (Week 1): Pt will demonstrate improved attention to the R side with no more than 2 verbal cues OT Short Term Goal 4 (Week 1): Pt will demonstrates improved safety awareness with no more than 2 verbal cues for impulsivity OT Short Term Goal 5 (Week 1): Pt will improve MMT in R UE to 3+/5  Skilled Therapeutic Interventions/Progress Updates:  Pt greeted seated in w/c, pt agreeable to OT intervention. Pt reports need to void bladder, ambulatory toilet transfer with MIN HHA. Pt able to complete 3/3 toileting tasks with CGA with + urine void.  Pt exited bathroom in same manner with pt able to stand at sink for hand hygiene with [place] for dynamic balance d/t posterior lean.  Total A transport to gym where remainder of session focus on dynamic standing balance, functional reaching with RUE, RUE [place] and visual attention to R side. Pt instructed to place checkers pieces/horseshoes on vertical game board with RUE with an emphasis on improved RUE motor planning and visual scanning. Pt completed task with MIN A as pt using LUE to compensate often. Pt also presents with impaired spontaneous motor planning as pt will initiate smooth reaching but then the functional reach becomes uncoordinated needing MINA for targeted reach. Pt also needed MIN verbal cues to locate all pieces on R side.  Ended session with pt seated in w/c with alarm belt activated and all needs within reach.    Therapy Documentation Precautions:  Precautions Precautions: Fall Precaution Comments: R hemiparesis, R inattention, fragile  skin. Restrictions Weight Bearing Restrictions: No  Pain: no pain     Therapy/Group: Individual Therapy  Precious Haws 04/12/2022, 4:16 PM

## 2022-04-12 NOTE — Progress Notes (Signed)
Physical Therapy Session Note  Patient Details  Name: Adam Arellano MRN: [id number] Date of Birth: 11-02-1931  Today's Date: 04/12/2022 PT Individual Time: 0924-1026 PT Individual Time Calculation (min): 62 min   Short Term Goals: Week 1:  PT Short Term Goal 1 (Week 1): Pt will perform bed mobility with mod i consistently. PT Short Term Goal 2 (Week 1): Pt will perform sit<>stand transfers with CGA and LRAD consistently. PT Short Term Goal 3 (Week 1): Pt will perform bed<>chair transfers with CGA and LRAD consistently. PT Short Term Goal 4 (Week 1): Pt will ambulate 85f with min assist and LRAD consistently. PT Short Term Goal 5 (Week 1): Pt will perform stair negotiation, 1 step, with min assist consistenly.  Skilled Therapeutic Interventions/Progress Updates:     [facility]. No c/o pain at beginning of session.  Occasional c/o R hip pain throughout session, mainly with erect posture when hip is in neutral position (pts baseline posture is fwd flexed).   [facility] and [facility].  Gait training:  [id number] RW and CGA/min assist. CGA for majority of amb distance, min assist needed during turning due to occasional LOB.   Pt demonstrating the following gait deviations and SPT providing the described cuing and facilitation for improvement:   - inconsistent gait pattern and gait speed. Pt would switch between short step length and normal step length, increased gait speed and slow gait speed.  -NBOS with cues to increase step width  -trendelenburg gait with R hip drop  -fwd flexed posture with frequent cues to come upright  -frequent cues for RW management to bring RW closer to [facility] allowing it to get too far out in front of him  - increased unsteadiness when distractions are present in hall way.  -occasional c/o R hip pain with erect standing as noted above. Pt would go  away with continued walking and rest.   Cone weaving with RW and CGA. Pt preformed weaving through 5 cones x 2 with frequent cues to keep RW closer, improve posture, and slow down when turning to increase safety.   Fwd walking with B HHA + 2 160ft 72f cues to improve posture and step width. Pt showing an improvement in gait pattern and gait speed.   Side stepping +2 B HHA  2x10 steps in each direction. Decreased coordination and pt compensating by turning pelvis instead of true abduction of hips.   Backwards walking +2 B HHA 20ftx280fes for upright posture, reciprocal gait pattern, increased step length. Pt demoing decreased weight shifting, so weight shifting facilitated for the second set of 20 ft with improved reciprocal pattern and balance.   Step up (6 inch) 2x5 bilaterally. Pt using B HR and CGA assist for balance. Cues to improve upright posture, increase attention to right foot placement to ensure it is fully on the step prior to stepping up. No c/o R hip pain or hip giving way.    Sit<>[facility].  Blocked practice x5 to focus on correct hand placement on WC arm rests prior to standing and to look and reach back for arm rests prior to sitting. Pt able to carry over correct technique in the moment, but unable to carry over after a few minutes. Random practice performed throughout session with little carry over from blocked practice and frequent cues needed for  hand placement and increased eccentric control when going from standing to sitting.   Pt left seated in WC with call bell in reach, no c/o R hip pain, posey belt on, brakes locked, all needs met.   Therapy Documentation Precautions:  Precautions Precautions: Fall Precaution Comments: R hemiparesis, R inattention, fragile skin. Restrictions Weight Bearing Restrictions: No   Therapy/Group: Individual Therapy  Kayleen Memos, SPT 04/12/2022, 11:02 AM

## 2022-04-12 NOTE — Progress Notes (Signed)
Patient ID: Adam Arellano, male   DOB: 1932/05/30, 88 y.o.   MRN: [id number]  Team Conference Report to Patient/Family  Team Conference discussion was reviewed with the patient and caregiver, including goals, any changes in plan of care and target discharge date.  Patient and caregiver express understanding and are in agreement.  The patient has a target discharge date of 04/21/22.  Sw met with patient and spoke with patient daughter, Adam Arellano via telephone. Patient daughter confirmed that patient has a rolling walker. Daughter will be present for family education on Tuesday 11/14 9-12. No additional questions or concerns.  Dyanne Iha 04/12/2022, 1:22 PM

## 2022-04-12 NOTE — Patient Care Conference (Signed)
Inpatient RehabilitationTeam Conference and Plan of Care Update Date: 04/12/2022   Time: 10:35 AM    Patient Name: Adam Arellano      Medical Record Number: [id number]  Date of Birth: 10/22/1931 Sex: Male         Room/Bed: 4M06C/4M06C-01 Payor Info: Payor: Marine [employer] / Plan: [facility] MEDICARE / Product Type: *No Product type* /    Admit Date/Time:  04/07/2022  2:43 PM  Primary Diagnosis:  Acute ischemic left posterior cerebral artery (PCA) stroke [facility])  Hospital Problems: Principal Problem:   Acute ischemic left posterior cerebral artery (PCA) stroke [facility])    Expected Discharge Date: Expected Discharge Date: 04/21/22  Team Members Present: Physician leading conference: Dr. Alysia Penna Social Worker Present: Erlene Quan, BSW Nurse Present: Dorien Chihuahua, RN PT Present: Page Spiro, PT OT Present: Other (comment) Lowella Fairy, OT) SLP Present: Sherren Kerns, SLP PPS Coordinator present : Gunnar Fusi, SLP     Current Status/Progress Goal Weekly Team Focus  Bowel/Bladder   Incontinent of Bladder .LBM 04/11/22   Regain Bladder Continence   Assist with toileting needs    Swallow/Nutrition/ Hydration               ADL's   Bathing MIN A w/moderate vc for safety, UB dressing CGA, LB dressing Mod A, Mod A toileting, min A grooming. Improvements in RUE strength and coordination 3+/5; Requires moderate verbal cues for safety and pacing during tasks   Supervision   NMR-education, dynamic standing balance, coordination, ADL retraining, safety awareness    Mobility   very impulsive, but good carryover when educated and cued, posterior leaning with difficulty correcting while standing. SPV with bed mobility, gait training min/mod assist with no AD up to 12f, min/mod assist with transfers due to impulsivity and posterior leaning.   CGA with dynamic standing, walking and 1 step, SPV with transfers, mod i with bed mobility. with LRAD  gait training,  balance training, star training, bed mobility and transfer training, R hemibody NMR    Communication   min A   mod I   word finding    Safety/Cognition/ Behavioral Observations  mod A   sup A cognition   awareness, functional recall, memory, problem solving    Pain   Denies apin   Remain pain free   Assess Q4 and prn.    Skin   Rt upper arm skin tear. Dressing in place.   Prevent further skin breakdown  Assess QS and prn      Discharge Planning:  Patient discharging home with DTR & SIL to asssit. (Daughter primary contact)   Team Discussion: Patient with left side weakness and sensory deficits post left PICA CVA. MD to resume cozaar and added HS IVF; encourage po fluid intake. Patient is impulsive and needs cues for safety. Progress limited by right hip pain with extension. Xray of pelvis 04/02/22 unremarkable.  Patient on target to meet rehab goals: yes, currently needs CGA for upper body care and mod assist for lower body care and completes showers seated. Needs mod assist for toileting. Able to ambulate up to 300' using a RW with CGA. Requires min assist for turning due to an ataxic gait. Needs min assist for word finding difficulty and mod assist for cognition. Goals for discharge set for supervision overall.  *See Care Plan and progress notes for long and short-term goals.   Revisions to Treatment Plan:  N/a   Teaching Needs: Safety, medications, transfers, toileting, etc  Current Barriers  to Discharge: Lack of/limited family support  Possible Resolutions to Barriers: Family education OP follow up services DME: RW, TTB     Medical Summary Current Status: Reduced strength and sensation RUE and RLE due to CVA, impulsivity, permissive HTN, poor po intake  Barriers to Discharge: Medical stability;Other (comments)  Barriers to Discharge Comments: poor safety awareness Possible Resolutions to Barriers/Weekly Focus: med adjustment for BP,monitor renal status and  fluid intake   Continued Need for Acute Rehabilitation Level of Care: The patient requires daily medical management by a physician with specialized training in physical medicine and rehabilitation for the following reasons: Direction of a multidisciplinary physical rehabilitation program to maximize functional independence : Yes Medical management of patient stability for increased activity during participation in an intensive rehabilitation regime.: Yes Analysis of laboratory values and/or radiology reports with any subsequent need for medication adjustment and/or medical intervention. : Yes   I attest that I was present, lead the team conference, and concur with the assessment and plan of the team.   Dorien Chihuahua B 04/12/2022, 3:23 PM

## 2022-04-12 NOTE — Progress Notes (Signed)
Physical Therapy Session Note  Patient Details  Name: Adam Arellano MRN: [id number] Date of Birth: 1931-07-01  Today's Date: 04/12/2022 PT Individual Time: 1030-1056 PT Individual Time Calculation (min): 26 min   Short Term Goals: Week 1:  PT Short Term Goal 1 (Week 1): Pt will perform bed mobility with mod i consistently. PT Short Term Goal 2 (Week 1): Pt will perform sit<>stand transfers with CGA and LRAD consistently. PT Short Term Goal 3 (Week 1): Pt will perform bed<>chair transfers with CGA and LRAD consistently. PT Short Term Goal 4 (Week 1): Pt will ambulate 35f with min assist and LRAD consistently. PT Short Term Goal 5 (Week 1): Pt will perform stair negotiation, 1 step, with min assist consistenly.  Skilled Therapeutic Interventions/Progress Updates:      Pt sitting in w/c to start - agreeable to PT tx. Denies pain but reports need to void.   Sit<>stand to RW with CGA - cues needed for hand placement to push from arm rests as patient reaches for RW to pull to stand.   Ambulated with minA and RW to bathroom - safety cues for keeping body within walker frame, maintaining upright posture, and slowing down pace as patient moves quickly. Pt able to manage LB dressing/brief in standing with CGA for balance. Pt sits on toilet to urinate, continent of bladder - charted.   Pt requiring cues to use RW after toileting as he attempts to walk without it. Requires minA for standing balance as he washes hands at the sink - posterior bias in standing that is not self corrected.   Transported to ortho rehab gym in w/c for time management and assisted to mat table with minA squat<>pivot transfer.  Sit>supine with minA for trunk support for controlled lowering.   Supine and sidelying there-x completed as follows to focus on hip strengthening: -1x10 bridges -1x10 resistive hip abduction/adduction (light manual resistance). Bilateral -2x10 sidelying clamshells, bilaterally  Required minA  for supine to sitting for trunk support. Squat<>pivot transfer with modA to his L side to w/c and returned to his room. Remained seated in w/c, seat belt alarm on, call bell in reach.       Therapy Documentation Precautions:  Precautions Precautions: Fall Precaution Comments: R hemiparesis, R inattention, fragile skin. Restrictions Weight Bearing Restrictions: No General:    Therapy/Group: Individual Therapy  Uel Davidow P [name] PT 04/12/2022, 11:01 AM

## 2022-04-12 NOTE — Progress Notes (Addendum)
PROGRESS NOTE   Subjective/Complaints:  No issues overnite, discussed BP  ROS: Patient denies CP, SOB, N/V/D  Objective:   No results found. Recent Labs    04/10/22 0534  WBC 10.7*  HGB 12.4*  HCT 38.6*  PLT 272    Recent Labs    04/10/22 0534  NA 137  K 4.4  CL 106  CO2 22  GLUCOSE 89  BUN 38*  CREATININE 1.69*  CALCIUM 8.7*     Intake/Output Summary (Last 24 hours) at 04/12/2022 0724 Last data filed at 04/11/2022 1807 Gross per 24 hour  Intake 660 ml  Output --  Net 660 ml         Physical Exam: Vital Signs Blood pressure (!) 162/76, pulse 68, temperature 97.9 F (36.6 C), temperature source Oral, resp. rate 18, height _0  (1.753 m), weight 69.4 kg, SpO2 100 %.   General: No acute distress Mood and affect are appropriate Heart: Regular rate and rhythm no rubs murmurs or extra sounds Lungs: Clear to auscultation, breathing unlabored, no rales or wheezes Abdomen: Positive bowel sounds, soft nontender to palpation, nondistended Extremities: No clubbing, cyanosis, or edema Skin: Right supra orbital ecchymosis   Neuro:  Alert and oriented x 3. Normal insight and awareness. Intact Memory. Normal language and speech.Dense Right HH, . RUE 4/5 (RTC injury). LUE 5/5. BLE grossly 4-/5 prox to 5/5 distally.  Senses pain and LT in all 4's. No abn tone. Mild dysmetria R FNF Musculoskeletal:reduced ROM Right shoulder    Assessment/Plan: 1. Functional deficits which require 3+ hours per day of interdisciplinary therapy in a comprehensive inpatient rehab setting. Physiatrist is providing close team supervision and 24 hour management of active medical problems listed below. Physiatrist and rehab team continue to assess barriers to discharge/monitor patient progress toward functional and medical goals  Care Tool:  Bathing    Body parts bathed by patient: Right arm, Left arm, Chest, Abdomen, Front perineal  area, Buttocks, Right upper leg, Left upper leg, Right lower leg, Face, Left lower leg         Bathing assist Assist Level: Minimal Assistance - Patient > 75%     Upper Body Dressing/Undressing Upper body dressing   What is the patient wearing?: Pull over shirt    Upper body assist Assist Level: Supervision/Verbal cueing    Lower Body Dressing/Undressing Lower body dressing      What is the patient wearing?: Pants, Underwear/pull up     Lower body assist Assist for lower body dressing: Minimal Assistance - Patient > 75%     Toileting Toileting    Toileting assist Assist for toileting: Minimal Assistance - Patient > 75%     Transfers Chair/bed transfer  Transfers assist     Chair/bed transfer assist level: Minimal Assistance - Patient > 75%     Locomotion Ambulation   Ambulation assist      Assist level: Moderate Assistance - Patient 50 - 74% Assistive device: Other (comment) (hallway hand rail) Max distance: 30   Walk 10 feet activity   Assist     Assist level: Moderate Assistance - Patient - 50 - 74% Assistive device: Other (comment) (hallway handrail)  Walk 50 feet activity   Assist Walk 50 feet with 2 turns activity did not occur: Safety/medical concerns         Walk 150 feet activity   Assist Walk 150 feet activity did not occur: Safety/medical concerns         Walk 10 feet on uneven surface  activity   Assist     Assist level: Moderate Assistance - Patient - 50 - 74% Assistive device: Other (comment) (ramp handrail)   Wheelchair     Assist Is the patient using a wheelchair?: Yes (transported pt to and from main therapy gym for time management) Type of Wheelchair: Manual    Wheelchair assist level: Dependent - Patient 0% Max wheelchair distance: 14f    Wheelchair 50 feet with 2 turns activity    Assist        Assist Level: Dependent - Patient 0%   Wheelchair 150 feet activity     Assist       Assist Level: Dependent - Patient 0%   Blood pressure (!) 162/76, pulse 68, temperature 97.9 F (36.6 C), temperature source Oral, resp. rate 18, height _0  (1.753 m), weight 69.4 kg, SpO2 100 %.  Medical Problem List and Plan: 1. Functional deficits secondary to acute left posterior cerebral artery infarct             -patient may  shower             -ELOS/Goals: 14-16 days, supervision goals with PT, OT, SLP,Team conference today please see physician documentation under team conference tab, met with team  to discuss problems,progress, and goals. Formulized individual treatment plan based on medical history, underlying problem and comorbidities.   -Continue CIR therapies including PT, OT, and SLP   2.  Antithrombotics: -DVT/anticoagulation:  Pharmaceutical: Heparin             -antiplatelet therapy: aspirin 81 mg and Plavix daily for 3 months then aspirin only 3. Pain Management: Tylenol, oxycodone as needed             -gabapentin 100 mg q HS 4. Mood/Behavior/Sleep: LCSW to evaluate and provide emotional support             -antipsychotic agents: n/a 5. Neuropsych/cognition: This patient is capable of making decisions on his own behalf. 6. Skin/Wound Care: Routine skin care checks.              -local care to multiple skin tears above             -encourage appropriate nutrition  -asked nurse if we could find some denture cream for him 7. Fluids/Electrolytes/Nutrition: Routine Is and Os and follow-up chemistries 8: Hypertension: monitor TID and prn (home Lasix, Cozaar, Lopressor on hold)             -bp remains elevated extremely dbp  -resumed lopressor at 244mbid   Vitals:   04/11/22 2002 04/12/22 0554  BP: (!) 168/79 (!) 162/76  Pulse: 73 68  Resp: 15 18  Temp: 98 F (36.7 C) 97.9 F (36.6 C)  SpO2: 96% 100%  Will resume low dose Cozaar 11/8 9: Hyperlipidemia: heart healthy diet (patient declined Crestor PTA) follow-up with PCP and neurology 10: CAD s/p  NSTEMI/multiple PCI, DES: on DAPT; follows with Dr. SkMarlou Porch1: Prostate cancer s/p radical proctatectomy  -seems to be voiding without issues 12: Severe MR s/p TEER: continue Plavix 13: GERD: monitor 14: CKD stage 3b: baseline creatinine 1.4-1.6; follow-up BMP  Latest Ref Rng & Units 04/10/2022    5:34 AM 04/07/2022    3:05 PM 04/05/2022    4:07 AM  BMP  Glucose 70 - 99 mg/dL 89  99  108   BUN 8 - 23 mg/dL 38  34  30   Creatinine 0.61 - 1.24 mg/dL 1.69  1.79  1.50   Sodium 135 - 145 mmol/L 137  137  139   Potassium 3.5 - 5.1 mmol/L 4.4  4.1  3.6   Chloride 98 - 111 mmol/L 106  105  108   CO2 22 - 32 mmol/L _0 Calcium 8.9 - 10.3 mg/dL 8.7  8.7  8.3   BUN trending up creat relatively stable start IVF at noc if intake <1029m during the day  No IVF recorded yesterday  LOS: 5 days A FACE TO FACE EVALUATION WAS PERFORMED  ACharlett Blake11/01/2022, 7:24 AM

## 2022-04-12 NOTE — Progress Notes (Signed)
Speech Language Pathology Daily Session Note  Patient Details  Name: Adam Arellano MRN: [id number] Date of Birth: [birth year]  Today's Date: 04/12/2022 SLP Individual Time: 1450-1545 SLP Individual Time Calculation (min): 55 min  Short Term Goals: Week 1: SLP Short Term Goal 1 (Week 1): Pt will recognize and correct verbal errors at the conversational level with supervision cues SLP Short Term Goal 2 (Week 1): Pt will utilize compensatory word finding strategies as needed at the conversational level with supervision cues. SLP Short Term Goal 3 (Week 1): Pt will utilize memory compensatory aids to recall daily information with min assist. SLP Short Term Goal 4 (Week 1): Pt will complete familiar, mildly complex tasks wtih min assist for functional problem solving. SLP Short Term Goal 5 (Week 1): Pt will locate items to the right of midline during functional tasks in >75% accuracy with min assist.  Skilled Therapeutic Interventions: Skilled ST treatment focused on cognitive goals. Pt received in wheelchair on arrival and accompanied by daughter. SLP facilitated medication management tasks as continuation of previous SLP session. Pt was unable to recall prior or current medications at independent of cues or external aids. Pt required max fading to mod A verbal cues to identify pertinent information on medication chart (name, purpose, frequency). Pt completed BID pillbox organization with overall max fading to mod A verbal cues to load 3 medications accurately. Pt was known to work through task rather quickly, dropped several pills on the ground, and scattered on table likely attributed to fine motor and visual deficits. Daughter also mentioned pt was not very "tidy" at prior level. Pt required max A for organizing work space (separating new pill bottles) and for error awareness and correction. Performance appears similar to performance during session on 11/06. SLP educated daughter on techniques to  support pt's level of independence with such task (as pt wishes to manage medications at discharge) along with general cognitive-linguistic strategies  however discussed the necessity of assistance with medication management due to both cognitive and physical changes that may impact pt's safety, judgement, organization, and accuracy. Both verbalized understanding and agreement. Patient was left in wheelchair with alarm activated and immediate needs within reach at end of session. Continue per current plan of care.      Pain  None/denied  Therapy/Group: Individual Therapy  Patty Sermons 04/12/2022, 4:19 PM

## 2022-04-13 NOTE — Progress Notes (Signed)
PROGRESS NOTE   Subjective/Complaints:  No new concerns or complaints this AM.   ROS: Patient denies CP, SOB, N/V/D, abdominal pain, HA,  cough  Objective:   No results found. No results for input(s): "WBC", "HGB", "HCT", "PLT" in the last 72 hours.  Recent Labs    04/12/22 0621  NA 138  K 4.4  CL 109  CO2 22  GLUCOSE 100*  BUN 38*  CREATININE 1.75*  CALCIUM 8.7*     Intake/Output Summary (Last 24 hours) at 04/13/2022 1710 Last data filed at 04/13/2022 0807 Gross per 24 hour  Intake 360 ml  Output --  Net 360 ml         Physical Exam: Vital Signs Blood pressure 134/71, pulse (!) 59, temperature 98.7 F (37.1 C), temperature source Oral, resp. rate 18, height '5\' 9"'$  (1.753 m), weight 69.4 kg, SpO2 (!) 78 %.   General: No acute distress Mood and affect are appropriate Heart: Regular rate and rhythm no rubs murmurs or extra sounds Lungs: Clear to auscultation, breathing unlabored, no rales or wheezes, non-labored Abdomen: Positive bowel sounds, soft nontender to palpation, nondistended Extremities: No clubbing, cyanosis, or edema Skin: Right supra orbital ecchymosis   Neuro:  Alert and oriented x 3. Makes eye contact, Normal insight and awareness. Intact Memory. Normal language and speech.Dense Right HH, . RUE 4/5 (RTC injury). LUE 5/5. BLE grossly 4-/5 prox to 5/5 distally.  Senses pain and LT in all 4's. No abn tone. Mild dysmetria R FNF Musculoskeletal:reduced ROM Right shoulder , no joint swelling noted   Assessment/Plan: 1. Functional deficits which require 3+ hours per day of interdisciplinary therapy in a comprehensive inpatient rehab setting. Physiatrist is providing close team supervision and 24 hour management of active medical problems listed below. Physiatrist and rehab team continue to assess barriers to discharge/monitor patient progress toward functional and medical goals  Care  Tool:  Bathing    Body parts bathed by patient: Right arm, Left arm, Chest, Abdomen, Front perineal area, Buttocks, Right upper leg, Left upper leg, Right lower leg, Face, Left lower leg         Bathing assist Assist Level: Minimal Assistance - Patient > 75%     Upper Body Dressing/Undressing Upper body dressing   What is the patient wearing?: Pull over shirt    Upper body assist Assist Level: Supervision/Verbal cueing    Lower Body Dressing/Undressing Lower body dressing      What is the patient wearing?: Pants, Underwear/pull up     Lower body assist Assist for lower body dressing: Minimal Assistance - Patient > 75%     Toileting Toileting    Toileting assist Assist for toileting: Minimal Assistance - Patient > 75%     Transfers Chair/bed transfer  Transfers assist     Chair/bed transfer assist level: Minimal Assistance - Patient > 75%     Locomotion Ambulation   Ambulation assist      Assist level: Minimal Assistance - Patient > 75% Assistive device: Walker-rolling Max distance: 300   Walk 10 feet activity   Assist     Assist level: Contact Guard/Touching assist Assistive device: Walker-rolling   Walk 50 feet  activity   Assist Walk 50 feet with 2 turns activity did not occur: Safety/medical concerns  Assist level: Minimal Assistance - Patient > 75% Assistive device: Walker-rolling    Walk 150 feet activity   Assist Walk 150 feet activity did not occur: Safety/medical concerns  Assist level: Minimal Assistance - Patient > 75% Assistive device: Walker-rolling    Walk 10 feet on uneven surface  activity   Assist     Assist level: Moderate Assistance - Patient - 50 - 74% Assistive device: Other (comment) (ramp handrail)   Wheelchair     Assist Is the patient using a wheelchair?: Yes (transported pt to and from main therapy gym for time management) Type of Wheelchair: Manual    Wheelchair assist level: Dependent -  Patient 0% Max wheelchair distance: 141f    Wheelchair 50 feet with 2 turns activity    Assist        Assist Level: Dependent - Patient 0%   Wheelchair 150 feet activity     Assist      Assist Level: Dependent - Patient 0%   Blood pressure 134/71, pulse (!) 59, temperature 98.7 F (37.1 C), temperature source Oral, resp. rate 18, height '5\' 9"'$  (1.753 m), weight 69.4 kg, SpO2 (!) 78 %.  Medical Problem List and Plan: 1. Functional deficits secondary to acute left posterior cerebral artery infarct             -patient may  shower             -ELOS/Goals: 11/17, supervision goals with PT, OT, SLP  -Continue CIR therapies including PT, OT, and SLP   2.  Antithrombotics: -DVT/anticoagulation:  Pharmaceutical: Heparin             -antiplatelet therapy: aspirin 81 mg and Plavix daily for 3 months then aspirin only 3. Pain Management: Tylenol, oxycodone as needed             -gabapentin 100 mg q HS 4. Mood/Behavior/Sleep: LCSW to evaluate and provide emotional support             -antipsychotic agents: n/a 5. Neuropsych/cognition: This patient is capable of making decisions on his own behalf. 6. Skin/Wound Care: Routine skin care checks.              -local care to multiple skin tears above             -encourage appropriate nutrition  -asked nurse if we could find some denture cream for him 7. Fluids/Electrolytes/Nutrition: Routine Is and Os and follow-up chemistries 8: Hypertension: monitor TID and prn (home Lasix, Cozaar, Lopressor on hold)             -bp remains elevated extremely dbp  -resumed lopressor at '25mg'$  bid   Will resume low dose Cozaar 11/8 11/8 BP intermittently elevated, monitor response to medication change yesterday 9: Hyperlipidemia: heart healthy diet (patient declined Crestor PTA) follow-up with PCP and neurology 10: CAD s/p NSTEMI/multiple PCI, DES: on DAPT; follows with Dr. SMarlou Porch11: Prostate cancer s/p radical proctatectomy  -seems to be  voiding without issues 12: Severe MR s/p TEER: continue Plavix 13: GERD: monitor 14: CKD stage 3b: baseline creatinine 1.4-1.6; follow-up BMP      Latest Ref Rng & Units 04/12/2022    6:21 AM 04/10/2022    5:34 AM 04/07/2022    3:05 PM  BMP  Glucose 70 - 99 mg/dL 100  89  99   BUN 8 - 23 mg/dL 38  38  34   Creatinine 0.61 - 1.24 mg/dL 1.75  1.69  1.79   Sodium 135 - 145 mmol/L 138  137  137   Potassium 3.5 - 5.1 mmol/L 4.4  4.4  4.1   Chloride 98 - 111 mmol/L 109  106  105   CO2 22 - 32 mmol/L '22  22  23   '$ Calcium 8.9 - 10.3 mg/dL 8.7  8.7  8.7   BUN trending up creat relatively stable start IVF at noc if intake <1077m during the day  -Continue IVF at night  [place]: [place]/2022, 5:10 PM

## 2022-04-13 NOTE — Progress Notes (Signed)
Physical Therapy Session Note  Patient Details  Name: Adam Arellano MRN: [id number] Date of Birth: 09-14-1931  Today's Date: 04/13/2022 PT Individual Time: 1045-1200 and 1406-1500 PT Individual Time Calculation (min): 75 min and 54 min   Short Term Goals: Week 1:  PT Short Term Goal 1 (Week 1): Pt will perform bed mobility with mod i consistently. PT Short Term Goal 2 (Week 1): Pt will perform sit<>stand transfers with CGA and LRAD consistently. PT Short Term Goal 3 (Week 1): Pt will perform bed<>chair transfers with CGA and LRAD consistently. PT Short Term Goal 4 (Week 1): Pt will ambulate 51f with min assist and LRAD consistently. PT Short Term Goal 5 (Week 1): Pt will perform stair negotiation, 1 step, with min assist consistenly.  Skilled Therapeutic Interventions/Progress Updates:  Session 1.  Pt received sitting in WC and agreeable to PT  Pt [facility]   Gait training with RW x 1569fand CGA for safety with min cues for AD management in turns. Gait training without AD  2 x 4058f Mild trendelenberg.   Dynamic gait training in parallel bars side stepping R and L 4 x 8ft52fide stepping over 2inch bar weights on floor 6 x 4 bars R and L with cues for posture, step width and decreased speed of movement.   Pt performed foot tap to target on 4inch step with BUE support 2 x 8 il and 2 x 8 without UE support. Then performed foot tap on 6 inch cone 2 x 7 Bil and 2 x 5 with no UE support. Pt extremely impulsive while performing foot tap on cones.   Nustep reciprocal movement training with BUE/BLE x 8 min with cues for decreased speed to improve motor coordination and awareness of completion time. Pt required moderate cues to remember to cease activity at 8 min.   Session 2.   Pt received sitting in WC and agreeable to PT  Pt transported to rehab gym [facility] in parallel bars standing on airex pad with no UE support 4 x 30 sec. Performed  standing on blue wedge with forefoot 3 x 45 sec and then to perform lateral/forward reach 4 x 5 BUE with min assist for coordination on the RUE.    UE coordination sitting in WC to cath basketball 2 x 10. UE coordination/standing balance at RW to performed ball lift to eye level 2 x 10 with CGA for balance. .   Patient returned to room and [facility] in reach and all needs met.             Therapy Documentation Precautions:  Precautions Precautions: Fall Precaution Comments: R hemiparesis, R inattention, fragile skin. Restrictions Weight Bearing Restrictions: No  Vital Signs: Therapy Vitals Temp: 98.7 F (37.1 C) Temp Source: Oral Pulse Rate: (!) 59 Resp: 18 BP: 134/71 Patient Position (if appropriate): Sitting Oxygen Therapy SpO2: (!) 78 % O2 Device: Room Air Pain:  denies    Therapy/Group: Individual Therapy  AustLorie Phenix9/2023, 4:04 PM

## 2022-04-13 NOTE — Progress Notes (Signed)
Physical Therapy Session Note  Patient Details  Name: Adam Arellano MRN: [id number] Date of Birth: 04-15-1932  Today's Date: 04/13/2022 PT Individual Time: 1005-1050 PT Individual Time Calculation (min): 45 min   Short Term Goals: Week 1:  PT Short Term Goal 1 (Week 1): Pt will perform bed mobility with mod i consistently. PT Short Term Goal 2 (Week 1): Pt will perform sit<>stand transfers with CGA and LRAD consistently. PT Short Term Goal 3 (Week 1): Pt will perform bed<>chair transfers with CGA and LRAD consistently. PT Short Term Goal 4 (Week 1): Pt will ambulate 48f with min assist and LRAD consistently. PT Short Term Goal 5 (Week 1): Pt will perform stair negotiation, 1 step, with min assist consistenly.  Skilled Therapeutic Interventions/Progress Updates:     [facility]. No c/o pain.   [facility] and pt showing improved hand placement on armrest instead of pulling up from RW.   Gait training:  1516f(+2 WC follow) , [id number] RW and CGA  Pt demonstrating the following gait deviations and SPT providing the described cuing and facilitation for improvement:    - pt frequently requiring cues for improved RW management to keep RW closer to COM. Pt having increased trouble with this during turns in which max cues are required.  -  inconsistent gait pattern and gait speed. Pt would switch between short step length and normal step length, increased gait speed and slow gait speed.  -NBOS with cues to increase step width  -fwd flexed posture with frequent cues to improve  - no c/o right hip pain today compared to previous visits.   Stair training:  -4 (6inch) steps x 2 with B HR support and min assist. Step to pattern with L foot leading when ascending and R foot leading with descending.  During ascending pt requires cues to improve upright posture. During descending pt has a posterior lean and requires cues  for R LE placement occasionally.   Sit<> stand from couch and EOM with CGA. Cues to push off from surface when going to standing and to reach back with hands to surface when going to sit.   Car transfer: CGA with RW and cues to turn with buttock facing the seat and to reach back with hands prior to sitting. Pt able to bring legs in and out of the car with no assist.   Trans[facility] back [facility].   Pt left seated in WC with posey belt on, call bell in reach, all needs met.   Therapy Documentation Precautions:  Precautions Precautions: Fall Precaution Comments: R hemiparesis, R inattention, fragile skin. Restrictions Weight Bearing Restrictions: No   Therapy/Group: Individual Therapy  Fatina Sprankle Kayleen Memos 04/13/2022, 8:01 AM

## 2022-04-13 NOTE — Progress Notes (Signed)
Occupational Therapy Session Note  Patient Details  Name: Adam Arellano MRN: [id number] Date of Birth: [birth year]  Today's Date: 04/13/2022 OT Individual Time: 0800-0900 OT Individual Time Calculation (min): 60 min   Short Term Goals: Week 1:  OT Short Term Goal 1 (Week 1): Pt will be CGA for LB dressing/bathing OT Short Term Goal 2 (Week 1): Pt will be CGA for functional transfers OT Short Term Goal 3 (Week 1): Pt will demonstrate improved attention to the R side with no more than 2 verbal cues OT Short Term Goal 4 (Week 1): Pt will demonstrates improved safety awareness with no more than 2 verbal cues for impulsivity OT Short Term Goal 5 (Week 1): Pt will improve MMT in R UE to 3+/5  Skilled Therapeutic Interventions/Progress Updates:     Pt received resting in bed agreeable to skilled OT session with a focus on ADL retraining. Pt reporting soreness in RLE- denying need for pain medications. Pt reported need to void bladder and requested to shower during session. PT supine>EOB close supervision. Pt ambulatory to bathroom with RW CGA and moderate verbal cueing to attend to R side and avoid obstacles. Pt completed 3/3 toileting tasks on raised toilet seat with CGA. Pt completed U/LB bathing sitting on shower chair with min A. Pt ambulated to EOB for dressing tasks donning shirt with set-up assist and donning pants with Min A. Pt able to don socks and shoes with CGA. Pt ambulated to sink and brush his teeth and hair in standing RW CGA. Pt was left resting in his wc with call bell in reach, seat belt alarm on, and all needs met.   Therapy Documentation Precautions:  Precautions Precautions: Fall Precaution Comments: R hemiparesis, R inattention, fragile skin. Restrictions Weight Bearing Restrictions: No   Therapy/Group: Individual Therapy  Janey Genta 04/13/2022, 9:02 AM

## 2022-04-14 DIAGNOSIS — I25810 Atherosclerosis of coronary artery bypass graft(s) without angina pectoris: Secondary | ICD-10-CM

## 2022-04-14 LAB — BASIC METABOLIC PANEL
Anion gap: 7 (ref 5–15)
BUN: 40 mg/dL — ABNORMAL HIGH (ref 8–23)
CO2: 19 mmol/L — ABNORMAL LOW (ref 22–32)
Calcium: 8.6 mg/dL — ABNORMAL LOW (ref 8.9–10.3)
Chloride: 111 mmol/L (ref 98–111)
Creatinine, Ser: 1.86 mg/dL — ABNORMAL HIGH (ref 0.61–1.24)
GFR, Estimated: 34 mL/min — ABNORMAL LOW (ref >60)
Glucose, Bld: 108 mg/dL — ABNORMAL HIGH (ref 70–99)
Potassium: 4.4 mmol/L (ref 3.5–5.1)
Sodium: 137 mmol/L (ref 135–145)

## 2022-04-14 MED ORDER — DOCUSATE SODIUM 100 MG PO CAPS
100.0000 mg | ORAL_CAPSULE | Freq: Two times a day (BID) | ORAL | Status: DC
  Administered 2022-04-14 – 2022-04-21 (×14): 100 mg via ORAL
  Filled 2022-04-14 (×14): qty 1

## 2022-04-14 MED ORDER — SODIUM CHLORIDE 0.45 % IV SOLN: Freq: Every day | INTRAVENOUS | Status: DC

## 2022-04-14 NOTE — Progress Notes (Signed)
Physical Therapy Session Note  Patient Details  Name: Adam Arellano MRN: [id number] Date of Birth: March 18, 1932  Today's Date: 04/14/2022 PT Individual Time: 8786-7672 PT Individual Time Calculation (min): 71 min   Short Term Goals: Week 1:  PT Short Term Goal 1 (Week 1): Pt will perform bed mobility with mod i consistently. PT Short Term Goal 2 (Week 1): Pt will perform sit<>stand transfers with CGA and LRAD consistently. PT Short Term Goal 3 (Week 1): Pt will perform bed<>chair transfers with CGA and LRAD consistently. PT Short Term Goal 4 (Week 1): Pt will ambulate 16f with min assist and LRAD consistently. PT Short Term Goal 5 (Week 1): Pt will perform stair negotiation, 1 step, with min assist consistenly.  Skilled Therapeutic Interventions/Progress Updates:   Received pt sitting in WC in main gym - handoff from previous PT. Pt agreeable to PT treatment and denied any pain during session. Session with emphasis on functional mobility/transfers, generalized strengthening and endurance, dynamic standing balance/coordination, NMR, and gait training. [facility] purposes. Pt transferred on/off Nustep with RW and CGA and performed seated BUE/LE strengthening on Nustep at workload 4 increasing to 5 for 8 minutes for a total of 310 steps with emphasis on cardiovascular endurance and reciprocal movement training - min cues for attention to RLE when slipping off footplate.   Pt ambulated 179fon uneven surfaces (ramp) with RW and CGA and over mulch and down 1 6in curb with RW and CGA/min A. Pt required cues to keep RW within BOS, increased R foot clearance, and mod cues for RW safety with curb navigation as pt attempting to step down before back legs of RW were on floor. Pt then performed alternating toe taps to 6in step 2x10 reps with min HHA - cues to slow down and reset when losing balance prior to continuing. Pt then worked on dynamic standing  balance and RLE coordination/reaction time kicking ball with RLE back and forth with rehab tech 1x5, 1x10, and 1x5 - Pt with heavy reliance leaning knees on mat and with posterior bias - required heavy min A for balance due to multiple LOB.   Pt transported to dayroom and [facility] and ambulated 9088fithout AD and min A. Pt ambulates with significant trunk flexion and with RUE held in abduction/ER - pt required verbal and tactile cues for upright posture, to relax RUE, and to decrease cadence. Pt then worked on dynamic standing balance, problem solving, fine motor control, and spatial awareness building pictures with soft lego blocks x 2 trials. Pt required max verbal and hand over hand cues for sequencing with therapist frequently having to undo what pt built to start over, as pt with no awareness that he was building incorrectly and attempting to continue going - Noted increased R lateral/posterior lean with fatigue but pt with difficulty correcting despite cues. Pt transported back to room [facility]. Concluded session with pt sitting in WC, needs within reach, and seatbelt alarm on.   Therapy Documentation Precautions:  Precautions Precautions: Fall Precaution Comments: R hemiparesis, R inattention, fragile skin. Restrictions Weight Bearing Restrictions: No  Therapy/Group: Individual Therapy Adam Arellano, DPT  04/14/2022, 6:54 AM

## 2022-04-14 NOTE — Progress Notes (Signed)
Physical Therapy Session Note  Patient Details  Name: Adam Arellano MRN: [id number] Date of Birth: 20-Dec-1931  Today's Date: 04/14/2022 PT Individual Time: 0930-1045 PT Individual Time Calculation (min): 75 min   Short Term Goals: Week 1:  PT Short Term Goal 1 (Week 1): Pt will perform bed mobility with mod i consistently. PT Short Term Goal 2 (Week 1): Pt will perform sit<>stand transfers with CGA and LRAD consistently. PT Short Term Goal 3 (Week 1): Pt will perform bed<>chair transfers with CGA and LRAD consistently. PT Short Term Goal 4 (Week 1): Pt will ambulate 54f with min assist and LRAD consistently. PT Short Term Goal 5 (Week 1): Pt will perform stair negotiation, 1 step, with min assist consistenly.  Skilled Therapeutic Interventions/Progress Updates:  Patient greeted sitting upright in wheelchair in room and agreeable to PT treatment session. Patient wheeled to rehab gym for time management and energy conservation.  Patient performed sit/stand with RW and SBA- Patient required VC for proper hand placement when returning to a seated position.   Patient gait trained x185' with RW and CGA/SBA for safety- Patient required VC for stepping within the RW and improve R LE clearance throughout swing phase of gait. As patient fatigued, he demonstrated increased cadence and poor safety awareness.   Patient tasked with ambulating in a loop and returning to his wheelchair all while staying within his RW- Patient gait trained x110' with RW and CGA/SBA for safety. Patient demonstrated improved ability to stay within the RW throughout trial, however required VC for decreased cadence as he approached his wheelchair.   Patient stood with RW and performed R foot taps to 4-6" step with CGA for safety- Patient with poor coordination and timing with activity requiring a poor precise target. Activity discontinued and therapist placed a colored dot on the step and on the floor next to his left foot  for external visual cues/targets. Patient then stood and performed 3 x 15 with emphasis on decreased cadence in order to improve accuracy with good improvements noted, however unable to sustain. Seated rest break required in between sets.   Patient weaved through x10 cones, turned around and weaved back through the same 10 cones with RW and CGA for safety- VC for stepping within the RW, improved postural extension, and improved B foot clearance (R>L). Patient demonstrated notable fatigue when weaving back through the remaining 10 cones with increased cadence noted. Patient required a seated rest break secondary to impaired endurance and then was able to perform the same activity above x1 more trial. Shuffling gait pattern noted throughout activity, but especially as patient fatigued or started ambulating toward his wheelchair.   Sit/stand x5 with RW and CGA/SBA- VC for proper hand placement, decreased cadence, scooting forward prior to standing and tucking feet underneath him. Minimal carryover noted with increased repetition and fatigue.   Patient transitioned to another therapist in rehab gym for continued PT treatment session.    Therapy Documentation Precautions:  Precautions Precautions: Fall Precaution Comments: R hemiparesis, R inattention, fragile skin. Restrictions Weight Bearing Restrictions: No  Therapy/Group: Individual Therapy  Adam Arellano 04/14/2022, 7:58 AM

## 2022-04-14 NOTE — Plan of Care (Signed)
  Problem: Consults Goal: RH STROKE PATIENT EDUCATION Description: See Patient Education module for education specifics  Outcome: Progressing   Problem: RH BOWEL ELIMINATION Goal: RH STG MANAGE BOWEL WITH ASSISTANCE Description: STG Manage Bowel with mod I  Assistance. Outcome: Progressing Goal: RH STG MANAGE BOWEL W/MEDICATION W/ASSISTANCE Description: STG Manage Bowel with Medication with mod I Assistance. Outcome: Progressing   Problem: RH BLADDER ELIMINATION Goal: RH STG MANAGE BLADDER WITH ASSISTANCE Description: STG Manage Bladder With toileting Assistance Outcome: Progressing   Problem: RH SAFETY Goal: RH STG ADHERE TO SAFETY PRECAUTIONS W/ASSISTANCE/DEVICE Description: STG Adhere to Safety Precautions With cuesAssistance/Device. Outcome: Progressing   Problem: RH KNOWLEDGE DEFICIT Goal: RH STG INCREASE KNOWLEDGE OF DIABETES Description: Patient and family will be able to manage DM with medications and dietary modifications using handouts and educational resources independently Outcome: Progressing Goal: RH STG INCREASE KNOWLEDGE OF HYPERTENSION Description: Patient and family will be able to manage HTN with medications and dietary modifications using handouts and educational resources independently Outcome: Progressing Goal: RH STG INCREASE KNOWLEGDE OF HYPERLIPIDEMIA Description: Patient and family will be able to manage HLD with medications and dietary modifications using handouts and educational resources independently Outcome: Progressing Goal: RH STG INCREASE KNOWLEDGE OF STROKE PROPHYLAXIS Description: Patient and family will be able to manage secondary risks with medications and dietary modifications using handouts and educational resources independently Outcome: Progressing   Problem: Education: Goal: Knowledge of disease or condition will improve Outcome: Progressing Goal: Knowledge of secondary prevention will improve (MUST DOCUMENT ALL) Outcome:  Progressing Goal: Knowledge of patient specific risk factors will improve [place] N/A or DELETE if not current risk factor) Outcome: Progressing   Problem: Ischemic Stroke/TIA Tissue Perfusion: Goal: Complications of ischemic stroke/TIA will be minimized Outcome: Progressing   Problem: Coping: Goal: Will verbalize positive feelings about self Outcome: Progressing Goal: Will identify appropriate support needs Outcome: Progressing   Problem: Health Behavior/Discharge Planning: Goal: Ability to manage health-related needs will improve Outcome: Progressing Goal: Goals will be collaboratively established with patient/family Outcome: Progressing

## 2022-04-14 NOTE — Progress Notes (Signed)
Occupational Therapy Session Note  Patient Details  Name: Adam Arellano MRN: [id number] Date of Birth: 10-06-1931  Today's Date: 04/14/2022 OT Individual Time: 0803-0900 OT Individual Time Calculation (min): 57 min   Short Term Goals: Week 1:  OT Short Term Goal 1 (Week 1): Pt will be CGA for LB dressing/bathing OT Short Term Goal 2 (Week 1): Pt will be CGA for functional transfers OT Short Term Goal 3 (Week 1): Pt will demonstrate improved attention to the R side with no more than 2 verbal cues OT Short Term Goal 4 (Week 1): Pt will demonstrates improved safety awareness with no more than 2 verbal cues for impulsivity OT Short Term Goal 5 (Week 1): Pt will improve MMT in R UE to 3+/5  Skilled Therapeutic Interventions/Progress Updates:     Pt received resting in bed in good spirits and receptive to participation in skilled OT session with a focus on ADL retraining. Pt reporting 0/10 pain at beginning of session later stating his R hip felt sore during BADLs, Rn notified.  Pt supine>EOB supervision. Pt ambulated to bathroom with RW close supervision requiring moderate verbal cues for safety with RW to push up from bed during transfer, stay within his RW during ambulation, hand placement, and to keep walker with him when transferring to [facility]. Pt completed 3/3 toileting tasks with CGA. Pt required increased time to have continent bowel movement. Pt requesting stool softeneer during session with Rn notified. Pt ambulated to sink with RW and washed hands close supervision.  Pt completeted U/LB dressing sitting EOB. Pt impulsive in donning pants with R lateral lean noted d/t decreased trunk control. Pt donned shirt sitting EOB with set-up assist min verbal cueing required for orientation.  Pt ambulated to sink and completed grooming tasks standing at sink with RW stand by assist. Pt demonstrating improved standing balance and decreased posterior propulsion this session. Pt able to wash face, brush  teeth, and comb hair with stand by assist. Pt ambulated to hallway bathroom to practice tub bench transfers. Pt educated on transfer technique demonstrating good teach back completing tub bench transfer with min verbal cueing CGA.  Pt ambulated back to room with RW min verbal cues for hand placement, pacing, and to avoid barriers. Pt was left resting in his wc with call bell in reach, seat belt alarm on, and all needs met.   Therapy Documentation Precautions:  Precautions Precautions: Fall Precaution Comments: R hemiparesis, R inattention, fragile skin. Restrictions Weight Bearing Restrictions: No General:    Vital Signs: Therapy Vitals Pulse Rate: 98 BP: (Abnormal) 162/93 Pain:   ADL: ADL Eating: Set up Where Assessed-Eating: Bed level Grooming: Minimal assistance Where Assessed-Grooming: Standing at sink Upper Body Bathing: Supervision/safety Where Assessed-Upper Body Bathing: Sitting at sink Lower Body Bathing: Minimal assistance Where Assessed-Lower Body Bathing: Standing at sink, Sitting at sink Upper Body Dressing: Supervision/safety Where Assessed-Upper Body Dressing: Sitting at sink Lower Body Dressing: Minimal assistance Where Assessed-Lower Body Dressing: Sitting at sink, Standing at sink Toileting: Minimal assistance (for balance) Where Assessed-Toileting: Glass blower/designer: Psychiatric nurse Method: Human resources officer Method: Unable to assess Social research officer, government: Environmental education officer Method: Heritage manager: Radio broadcast assistant   Therapy/Group: Individual Therapy  Janey Genta 04/14/2022, 8:48 AM

## 2022-04-14 NOTE — Progress Notes (Signed)
Occupational Therapy Weekly Progress Note  Patient Details  Name: Adam Arellano MRN: [id number] Date of Birth: [birth year]  Beginning of progress report period: April 07, 2022 End of progress report period: April 14, 2022  Today's Date: 04/14/2022 OT Individual Time:  -       Patient has met 4 of 4 short term goals. Pt demonstrates improvements in activity tolerance, functional transfers, awareness of R side, safety awareness, and decreased impulsivity. Pt receives good support from his DTR who is planning to provide 24/7 supervision to Pt upon dc home.  Patient continues to demonstrate the following deficits: muscle weakness, decreased cardiorespiratoy endurance, ataxia, decreased attention to right, decreased attention, decreased awareness, decreased problem solving, decreased safety awareness, and decreased memory, and decreased balance strategies and therefore will continue to benefit from skilled OT intervention to enhance overall performance with BADL.  Patient progressing toward long term goals..  Continue plan of care.  OT Short Term Goals Week 2:  OT Short Term Goal 1 (Week 2): Week 2 STG=LTG  Skilled Therapeutic Interventions/Progress Updates:  NMR-education, balance training, safety awareness, insight to deficits, ADL retraining, Functional use of RUE     Therapy Documentation Precautions:  Precautions Precautions: Fall Precaution Comments: R hemiparesis, R inattention, fragile skin. Restrictions Weight Bearing Restrictions: No General:  Pain: Pain Assessment Pain Scale: 0-10 Pain Score: 0-No pain ADL: ADL Eating: Set up Where Assessed-Eating: Bed level Grooming: Minimal assistance Where Assessed-Grooming: Standing at sink Upper Body Bathing: Supervision/safety Where Assessed-Upper Body Bathing: Sitting at sink Lower Body Bathing: Minimal assistance Where Assessed-Lower Body Bathing: Standing at sink, Sitting at sink Upper Body Dressing:  Supervision/safety Where Assessed-Upper Body Dressing: Sitting at sink Lower Body Dressing: Minimal assistance Where Assessed-Lower Body Dressing: Sitting at sink, Standing at sink Toileting: Minimal assistance (for balance) Where Assessed-Toileting: Glass blower/designer: Psychiatric nurse Method: Human resources officer Method: Unable to assess Social research officer, government: Environmental education officer Method: Heritage manager: Radio broadcast assistant  Therapy/Group: Individual Therapy  Janey Genta 04/14/2022, 4:57 PM

## 2022-04-14 NOTE — Progress Notes (Signed)
Patient ID: Adam Arellano, male   DOB: 05-Nov-1931, 88 y.o.   MRN: [id number]  Bedside Commode and Tub Transfer Bench ordered through [employer]

## 2022-04-14 NOTE — Progress Notes (Addendum)
PROGRESS NOTE   Subjective/Complaints:  Reports BM today but was a little hard. Drinking a more fluids. No additional concerns.   ROS: Patient denies CP, SOB, N/V/D, abdominal pain, HA,  cough, pain + constipation  Objective:   No results found. No results for input(s): "WBC", "HGB", "HCT", "PLT" in the last 72 hours.  Recent Labs    04/12/22 0621  NA 138  K 4.4  CL 109  CO2 22  GLUCOSE 100*  BUN 38*  CREATININE 1.75*  CALCIUM 8.7*     Intake/Output Summary (Last 24 hours) at 04/14/2022 1210 Last data filed at 04/14/2022 0700 Gross per 24 hour  Intake 655 ml  Output --  Net 655 ml         Physical Exam: Vital Signs Blood pressure (!) 162/93, pulse 98, temperature 97.8 F (36.6 C), temperature source Oral, resp. rate 17, height '5\' 9"'$  (1.753 m), weight 69.4 kg, SpO2 100 %.   General: No acute distress, working with therapy Mood and affect are appropriate, pleasant  Heart: Regular rate and rhythm no rubs murmurs or extra sounds Lungs: Clear to auscultation, breathing unlabored, no rales or wheezes, good air movement Abdomen: Positive bowel sounds, soft nontender to palpation, nondistended Extremities: No clubbing, cyanosis, or edema Skin: Right supra orbital ecchymosis-improving   Neuro:  Alert and oriented x 3. Makes eye contact, Normal insight and awareness. Intact Memory. Normal language and speech.Dense Right HH, . RUE 4/5 (RTC injury). LUE 5/5. BLE grossly 4-/5 prox to 5/5 distally.  Senses pain and LT in all 4's. No abnormal tone noted,  Mild dysmetria R FNF Musculoskeletal:reduced ROM Right shoulder , no joint swelling noted   Assessment/Plan: 1. Functional deficits which require 3+ hours per day of interdisciplinary therapy in a comprehensive inpatient rehab setting. Physiatrist is providing close team supervision and 24 hour management of active medical problems listed below. Physiatrist and  rehab team continue to assess barriers to discharge/monitor patient progress toward functional and medical goals  Care Tool:  Bathing    Body parts bathed by patient: Right arm, Left arm, Chest, Abdomen, Front perineal area, Buttocks, Right upper leg, Left upper leg, Right lower leg, Face, Left lower leg         Bathing assist Assist Level: Minimal Assistance - Patient > 75%     Upper Body Dressing/Undressing Upper body dressing   What is the patient wearing?: Pull over shirt    Upper body assist Assist Level: Supervision/Verbal cueing    Lower Body Dressing/Undressing Lower body dressing      What is the patient wearing?: Pants, Underwear/pull up     Lower body assist Assist for lower body dressing: Minimal Assistance - Patient > 75%     Toileting Toileting    Toileting assist Assist for toileting: Minimal Assistance - Patient > 75%     Transfers Chair/bed transfer  Transfers assist     Chair/bed transfer assist level: Contact Guard/Touching assist     Locomotion Ambulation   Ambulation assist      Assist level: Minimal Assistance - Patient > 75% Assistive device: No Device Max distance: 38f   Walk 10 feet activity   Assist  Assist level: Minimal Assistance - Patient > 75% Assistive device: No Device   Walk 50 feet activity   Assist Walk 50 feet with 2 turns activity did not occur: Safety/medical concerns  Assist level: Minimal Assistance - Patient > 75% Assistive device: No Device    Walk 150 feet activity   Assist Walk 150 feet activity did not occur: Safety/medical concerns  Assist level: Minimal Assistance - Patient > 75% Assistive device: Walker-rolling    Walk 10 feet on uneven surface  activity   Assist     Assist level: Moderate Assistance - Patient - 50 - 74% Assistive device: Other (comment) (ramp handrail)   Wheelchair     Assist Is the patient using a wheelchair?: Yes (transported pt to and from main  therapy gym for time management) Type of Wheelchair: Manual    Wheelchair assist level: Dependent - Patient 0% Max wheelchair distance: 165f    Wheelchair 50 feet with 2 turns activity    Assist        Assist Level: Dependent - Patient 0%   Wheelchair 150 feet activity     Assist      Assist Level: Dependent - Patient 0%   Blood pressure (!) 162/93, pulse 98, temperature 97.8 F (36.6 C), temperature source Oral, resp. rate 17, height '5\' 9"'$  (1.753 m), weight 69.4 kg, SpO2 100 %.  Medical Problem List and Plan: 1. Functional deficits secondary to acute left posterior cerebral artery infarct             -patient may  shower             -ELOS/Goals: 11/17, supervision goals with PT, OT, SLP  -Continue CIR therapies including PT, OT, and SLP   2.  Antithrombotics: -DVT/anticoagulation:  Pharmaceutical: Heparin             -antiplatelet therapy: aspirin 81 mg and Plavix daily for 3 months then aspirin only 3. Pain Management: Tylenol, oxycodone as needed             -gabapentin 100 mg q HS 4. Mood/Behavior/Sleep: LCSW to evaluate and provide emotional support             -antipsychotic agents: n/a 5. Neuropsych/cognition: This patient is capable of making decisions on his own behalf. 6. Skin/Wound Care: Routine skin care checks.              -local care to multiple skin tears above             -encourage appropriate nutrition  -asked nurse if we could find some denture cream for him 7. Fluids/Electrolytes/Nutrition: Routine Is and Os and follow-up chemistries 8: Hypertension: monitor TID and prn (home Lasix, Cozaar, Lopressor on hold)             -bp remains elevated extremely dbp  -resumed lopressor at '25mg'$  bid Will resume low dose Cozaar 11/8 11/10 Intermittently a little elevated, Continue to monitor trend, consider increase in medication tomorrow if BP remains above goal 9: Hyperlipidemia: heart healthy diet (patient declined Crestor PTA) follow-up with PCP and  neurology 10: CAD s/p NSTEMI/multiple PCI, DES: on DAPT; follows with Dr. SMarlou Porch -11/10 Denies CP today 11: Prostate cancer s/p radical proctatectomy  -seems to be voiding without issues 12: Severe MR s/p TEER: continue Plavix 13: GERD: monitor  -Continue maalox PRN 14: CKD stage 3b: baseline creatinine 1.4-1.6; follow-up BMP      Latest Ref Rng & Units 04/12/2022    6:21 AM  04/10/2022    5:34 AM 04/07/2022    3:05 PM  BMP  Glucose 70 - 99 mg/dL 100  89  99   BUN 8 - 23 mg/dL 38  38  34   Creatinine 0.61 - 1.24 mg/dL 1.75  1.69  1.79   Sodium 135 - 145 mmol/L 138  137  137   Potassium 3.5 - 5.1 mmol/L 4.4  4.4  4.1   Chloride 98 - 111 mmol/L 109  106  105   CO2 22 - 32 mmol/L '22  22  23   '$ Calcium 8.9 - 10.3 mg/dL 8.7  8.7  8.7   BUN trending up creat relatively stable start IVF at noc if intake <1036m during the day  -recheck BMP today.  Late addendum: Cr and BUN a little higher, I asked nursing to run night time fluids, doesn't appear he had this run last night  LOS: 7 days A FACE TO FACE EVALUATION WAS PERFORMED  YJennye Boroughs11/03/2022, 12:10 PM

## 2022-04-15 NOTE — Progress Notes (Signed)
PROGRESS NOTE   Subjective/Complaints:  No issues overnite , pt had PT this am and OT this pm, on IVF at noc for hydration   ROS: Patient denies CP, SOB, N/V/D, abdominal pain, HA,  cough, pain + constipation  Objective:   No results found. No results for input(s): "WBC", "HGB", "HCT", "PLT" in the last 72 hours.  Recent Labs    04/14/22 1458  NA 137  K 4.4  CL 111  CO2 19*  GLUCOSE 108*  BUN 40*  CREATININE 1.86*  CALCIUM 8.6*     Intake/Output Summary (Last 24 hours) at 04/15/2022 1016 Last data filed at 04/15/2022 0745 Gross per 24 hour  Intake 811.68 ml  Output --  Net 811.68 ml         Physical Exam: Vital Signs Blood pressure 138/79, pulse (!) 58, temperature 97.7 F (36.5 C), resp. rate 18, height '5\' 9"'$  (1.753 m), weight 69.4 kg, SpO2 100 %.   General: No acute distress, working with therapy Mood and affect are appropriate, pleasant  Heart: Regular rate and rhythm no rubs murmurs or extra sounds Lungs: Clear to auscultation, breathing unlabored, no rales or wheezes, good air movement Abdomen: Positive bowel sounds, soft nontender to palpation, nondistended Extremities: No clubbing, cyanosis, or edema Skin: Right supra orbital ecchymosis-improving   Neuro:  Alert and oriented x 3. Makes eye contact, Normal insight and awareness. Intact Memory. Normal language and speech.Dense Right HH, . RUE 4/5 (RTC injury). LUE 5/5. BLE grossly 4-/5 prox to 5/5 distally.  Senses pain and LT in all 4's. No abnormal tone noted,  Mild dysmetria R FNF Musculoskeletal:reduced ROM Right shoulder , no joint swelling noted   Assessment/Plan: 1. Functional deficits which require 3+ hours per day of interdisciplinary therapy in a comprehensive inpatient rehab setting. Physiatrist is providing close team supervision and 24 hour management of active medical problems listed below. Physiatrist and rehab team continue to  assess barriers to discharge/monitor patient progress toward functional and medical goals  Care Tool:  Bathing    Body parts bathed by patient: Right arm, Left arm, Chest, Abdomen, Front perineal area, Buttocks, Right upper leg, Left upper leg, Right lower leg, Face, Left lower leg         Bathing assist Assist Level: Minimal Assistance - Patient > 75%     Upper Body Dressing/Undressing Upper body dressing   What is the patient wearing?: Pull over shirt    Upper body assist Assist Level: Supervision/Verbal cueing    Lower Body Dressing/Undressing Lower body dressing      What is the patient wearing?: Pants, Underwear/pull up     Lower body assist Assist for lower body dressing: Minimal Assistance - Patient > 75%     Toileting Toileting    Toileting assist Assist for toileting: Minimal Assistance - Patient > 75%     Transfers Chair/bed transfer  Transfers assist     Chair/bed transfer assist level: Contact Guard/Touching assist     Locomotion Ambulation   Ambulation assist      Assist level: Minimal Assistance - Patient > 75% Assistive device: No Device Max distance: 84f   Walk 10 feet activity  Assist     Assist level: Minimal Assistance - Patient > 75% Assistive device: No Device   Walk 50 feet activity   Assist Walk 50 feet with 2 turns activity did not occur: Safety/medical concerns  Assist level: Minimal Assistance - Patient > 75% Assistive device: No Device    Walk 150 feet activity   Assist Walk 150 feet activity did not occur: Safety/medical concerns  Assist level: Minimal Assistance - Patient > 75% Assistive device: Walker-rolling    Walk 10 feet on uneven surface  activity   Assist     Assist level: Moderate Assistance - Patient - 50 - 74% Assistive device: Other (comment) (ramp handrail)   Wheelchair     Assist Is the patient using a wheelchair?: Yes (transported pt to and from main therapy gym for time  management) Type of Wheelchair: Manual    Wheelchair assist level: Dependent - Patient 0% Max wheelchair distance: 181f    Wheelchair 50 feet with 2 turns activity    Assist        Assist Level: Dependent - Patient 0%   Wheelchair 150 feet activity     Assist      Assist Level: Dependent - Patient 0%   Blood pressure 138/79, pulse (!) 58, temperature 97.7 F (36.5 C), resp. rate 18, height '5\' 9"'$  (1.753 m), weight 69.4 kg, SpO2 100 %.  Medical Problem List and Plan: 1. Functional deficits secondary to acute left posterior cerebral artery infarct             -patient may  shower             -ELOS/Goals: 11/17, supervision goals with PT, OT, SLP  -Continue CIR therapies including PT, OT, and SLP   2.  Antithrombotics: -DVT/anticoagulation:  Pharmaceutical: Heparin             -antiplatelet therapy: aspirin 81 mg and Plavix daily for 3 months then aspirin only 3. Pain Management: Tylenol, oxycodone as needed             -gabapentin 100 mg q HS 4. Mood/Behavior/Sleep: LCSW to evaluate and provide emotional support             -antipsychotic agents: n/a 5. Neuropsych/cognition: This patient is capable of making decisions on his own behalf. 6. Skin/Wound Care: Routine skin care checks.              -local care to multiple skin tears above             -encourage appropriate nutrition  -asked nurse if we could find some denture cream for him 7. Fluids/Electrolytes/Nutrition: Routine Is and Os and follow-up chemistries 8: Hypertension: monitor TID and prn (home Lasix, Cozaar, Lopressor on hold)             -bp remains elevated extremely dbp  -resumed lopressor at '25mg'$  bid Will resume low dose Cozaar 11/11- recheck BMET Monday  Vitals:   04/14/22 1848 04/15/22 0513  BP: 124/68 138/79  Pulse: 84 (!) 58  Resp: 16 18  Temp: 98.8 F (37.1 C) 97.7 F (36.5 C)  SpO2: 98% 100%    9: Hyperlipidemia: heart healthy diet (patient declined Crestor PTA) follow-up with PCP  and neurology 10: CAD s/p NSTEMI/multiple PCI, DES: on DAPT; follows with Dr. SMarlou Porch -11/10 Denies CP today 11: Prostate cancer s/p radical proctatectomy  -seems to be voiding without issues 12: Severe MR s/p TEER: continue Plavix 13: GERD: monitor  -Continue maalox PRN 14:  CKD stage 3b: baseline creatinine 1.4-1.6; follow-up BMP      Latest Ref Rng & Units 04/14/2022    2:58 PM 04/12/2022    6:21 AM 04/10/2022    5:34 AM  BMP  Glucose 70 - 99 mg/dL 108  100  89   BUN 8 - 23 mg/dL 40  38  38   Creatinine 0.61 - 1.24 mg/dL 1.86  1.75  1.69   Sodium 135 - 145 mmol/L 137  138  137   Potassium 3.5 - 5.1 mmol/L 4.4  4.4  4.4   Chloride 98 - 111 mmol/L 111  109  106   CO2 22 - 32 mmol/L '19  22  22   '$ Calcium 8.9 - 10.3 mg/dL 8.6  8.7  8.7   BUN IVF at noc  -recheck BMP Monday.    LOS: 8 days A FACE TO FACE EVALUATION WAS PERFORMED  Charlett Blake 04/15/2022, 10:16 AM

## 2022-04-15 NOTE — Plan of Care (Signed)
  Problem: Consults Goal: RH STROKE PATIENT EDUCATION Description: See Patient Education module for education specifics  Outcome: Progressing   Problem: RH BOWEL ELIMINATION Goal: RH STG MANAGE BOWEL WITH ASSISTANCE Description: STG Manage Bowel with mod I  Assistance. Outcome: Progressing Goal: RH STG MANAGE BOWEL W/MEDICATION W/ASSISTANCE Description: STG Manage Bowel with Medication with mod I Assistance. Outcome: Progressing   Problem: RH BLADDER ELIMINATION Goal: RH STG MANAGE BLADDER WITH ASSISTANCE Description: STG Manage Bladder With toileting Assistance Outcome: Progressing   Problem: RH SAFETY Goal: RH STG ADHERE TO SAFETY PRECAUTIONS W/ASSISTANCE/DEVICE Description: STG Adhere to Safety Precautions With cuesAssistance/Device. Outcome: Progressing   Problem: RH KNOWLEDGE DEFICIT Goal: RH STG INCREASE KNOWLEDGE OF DIABETES Description: Patient and family will be able to manage DM with medications and dietary modifications using handouts and educational resources independently Outcome: Progressing Goal: RH STG INCREASE KNOWLEDGE OF HYPERTENSION Description: Patient and family will be able to manage HTN with medications and dietary modifications using handouts and educational resources independently Outcome: Progressing Goal: RH STG INCREASE KNOWLEGDE OF HYPERLIPIDEMIA Description: Patient and family will be able to manage HLD with medications and dietary modifications using handouts and educational resources independently Outcome: Progressing Goal: RH STG INCREASE KNOWLEDGE OF STROKE PROPHYLAXIS Description: Patient and family will be able to manage secondary risks with medications and dietary modifications using handouts and educational resources independently Outcome: Progressing   Problem: Education: Goal: Knowledge of disease or condition will improve Outcome: Progressing Goal: Knowledge of secondary prevention will improve (MUST DOCUMENT ALL) Outcome:  Progressing Goal: Knowledge of patient specific risk factors will improve [place] N/A or DELETE if not current risk factor) Outcome: Progressing   Problem: Ischemic Stroke/TIA Tissue Perfusion: Goal: Complications of ischemic stroke/TIA will be minimized Outcome: Progressing   Problem: Coping: Goal: Will verbalize positive feelings about self Outcome: Progressing Goal: Will identify appropriate support needs Outcome: Progressing   Problem: Health Behavior/Discharge Planning: Goal: Ability to manage health-related needs will improve Outcome: Progressing Goal: Goals will be collaboratively established with patient/family Outcome: Progressing

## 2022-04-15 NOTE — Progress Notes (Signed)
Occupational Therapy Session Note  Patient Details  Name: Adam Arellano MRN: [id number] Date of Birth: March 21, 1932  Today's Date: 04/15/2022 OT Individual Time: 5176-1607 OT Individual Time Calculation (min): 42 min    Short Term Goals: Week 2:  OT Short Term Goal 1 (Week 2): Week 2 STG=LTG  Skilled Therapeutic Interventions/Progress Updates:  Pt greeted supine in bed, pt agreeable to OT intervention. Daughter present, therefore incorporated her into session for education.  Session focus on BADL reeducation, functional mobility, dynamic standing balance and decreasing overall caregiver burden.  Covered IV and skins tears with tegaderm per nurse request.  Pt completed ambulatory transfer from EOb>bathroom with no AD and MIN A. Pt completed 3/3 toileting tasks in standing with CGA. Pt ambulated into shower with MIN HHA. Pt completed bathing with overall CGA, pt with impaired R attention noted to spray therapist on accident or get hose caught around his hand during bathing, education provided to daughter on pts OT goals being at supervision level however at this time pt requires more of CGA. Education provided to daughter on pts R inattention in relation to ADL participation, daughter reports she is a Marine scientist therefore daughter very good at cueing pt throughout ADL routine.  Pt completed dressing in bathing from [facility] with pt needing assist to orient shirt correctly but completed task with overall set- up assist. Pt required MIN A for LB dressing d/t brief and needing assist on R side d/t R inattention.  Pt exited bathroom with MIN HHA.  Ended session going over DME needs with daughter, daughter reports they have all needed DME and daughter prefers OP if possible. Noted all of this in team sticky note.  Ended session with pt supine in bed with bed alarm activated and all needs within reach.                   Therapy Documentation Precautions:  Precautions Precautions: Fall Precaution Comments:  R hemiparesis, R inattention, fragile skin. Restrictions Weight Bearing Restrictions: No  Pain: no pain     Therapy/Group: Individual Therapy  Precious Haws 04/15/2022, 3:59 PM

## 2022-04-15 NOTE — Progress Notes (Signed)
Physical Therapy Session Note  Patient Details  Name: Adam Arellano MRN: [id number] Date of Birth: 04-09-1932  Today's Date: 04/15/2022 PT Individual Time: 0905-0950 PT Individual Time Calculation (min): 45 min   Short Term Goals: Week 1:  PT Short Term Goal 1 (Week 1): Pt will perform bed mobility with mod i consistently. PT Short Term Goal 2 (Week 1): Pt will perform sit<>stand transfers with CGA and LRAD consistently. PT Short Term Goal 3 (Week 1): Pt will perform bed<>chair transfers with CGA and LRAD consistently. PT Short Term Goal 4 (Week 1): Pt will ambulate 46f with min assist and LRAD consistently. PT Short Term Goal 5 (Week 1): Pt will perform stair negotiation, 1 step, with min assist consistenly. Week 2:    Week 3:    Week 4:     Skilled Therapeutic Interventions/Progress Updates:   Pt received sitting in recliner and agreeable to PT. Pt performed sit<>stand transfer with supervision assist and RW with min cues for decreaesd impulsivity from PT.  Toilet transfer with RW and CGA with cues for safety over threshold into bathroom. .   Gait training with RW 1558fwith CGA with cues for safetyin turns and improved step width to reduce fall risk.   Standing balance trunkal NMR to perform RUE lateral x 4 with UE support on RW and x 4 without UE support then toss horse shoe to target. Then performed and cross body reaches to grab horse shoes off low bench with RUE and toss to target 4 with LUE support on RW and 4 with no UE support. Min assist for balance and coordination of RUE shoulder girdle activation to allow improved ROM .   [facility] to [facility] reach and all needs met.        Therapy Documentation Precautions:  Precautions Precautions: Fall Precaution Comments: R hemiparesis, R inattention, fragile skin. Restrictions Weight Bearing Restrictions: No  Pain:  denies    Therapy/Group: Individual Therapy  AuLorie Phenix1/04/2022, 9:50 AM

## 2022-04-16 NOTE — Plan of Care (Signed)
  Problem: Consults Goal: RH STROKE PATIENT EDUCATION Description: See Patient Education module for education specifics  Outcome: Progressing   Problem: RH BOWEL ELIMINATION Goal: RH STG MANAGE BOWEL WITH ASSISTANCE Description: STG Manage Bowel with mod I  Assistance. Outcome: Progressing Goal: RH STG MANAGE BOWEL W/MEDICATION W/ASSISTANCE Description: STG Manage Bowel with Medication with mod I Assistance. Outcome: Progressing   Problem: RH BLADDER ELIMINATION Goal: RH STG MANAGE BLADDER WITH ASSISTANCE Description: STG Manage Bladder With toileting Assistance Outcome: Progressing   Problem: RH SAFETY Goal: RH STG ADHERE TO SAFETY PRECAUTIONS W/ASSISTANCE/DEVICE Description: STG Adhere to Safety Precautions With cuesAssistance/Device. Outcome: Progressing   Problem: RH KNOWLEDGE DEFICIT Goal: RH STG INCREASE KNOWLEDGE OF DIABETES Description: Patient and family will be able to manage DM with medications and dietary modifications using handouts and educational resources independently Outcome: Progressing Goal: RH STG INCREASE KNOWLEDGE OF HYPERTENSION Description: Patient and family will be able to manage HTN with medications and dietary modifications using handouts and educational resources independently Outcome: Progressing Goal: RH STG INCREASE KNOWLEGDE OF HYPERLIPIDEMIA Description: Patient and family will be able to manage HLD with medications and dietary modifications using handouts and educational resources independently Outcome: Progressing Goal: RH STG INCREASE KNOWLEDGE OF STROKE PROPHYLAXIS Description: Patient and family will be able to manage secondary risks with medications and dietary modifications using handouts and educational resources independently Outcome: Progressing   Problem: Education: Goal: Knowledge of disease or condition will improve Outcome: Progressing Goal: Knowledge of secondary prevention will improve (MUST DOCUMENT ALL) Outcome:  Progressing Goal: Knowledge of patient specific risk factors will improve [place] N/A or DELETE if not current risk factor) Outcome: Progressing   Problem: Ischemic Stroke/TIA Tissue Perfusion: Goal: Complications of ischemic stroke/TIA will be minimized Outcome: Progressing   Problem: Coping: Goal: Will verbalize positive feelings about self Outcome: Progressing Goal: Will identify appropriate support needs Outcome: Progressing   Problem: Health Behavior/Discharge Planning: Goal: Ability to manage health-related needs will improve Outcome: Progressing Goal: Goals will be collaboratively established with patient/family Outcome: Progressing

## 2022-04-16 NOTE — Progress Notes (Signed)
Occupational Therapy Session Note  Patient Details  Name: Adam Arellano MRN: [id number] Date of Birth: 01/25/1932  Today's Date: 04/16/2022 OT Individual Time: 1000-1055 OT Individual Time Calculation (min): 55 min    Short Term Goals: Week 1:  OT Short Term Goal 1 (Week 1): Pt will be CGA for LB dressing/bathing OT Short Term Goal 1 - Progress (Week 1): Met OT Short Term Goal 2 (Week 1): Pt will be CGA for functional transfers OT Short Term Goal 2 - Progress (Week 1): Met OT Short Term Goal 3 (Week 1): Pt will demonstrate improved attention to the R side with no more than 2 verbal cues OT Short Term Goal 3 - Progress (Week 1): Met OT Short Term Goal 4 (Week 1): Pt will demonstrates improved safety awareness with no more than 2 verbal cues for impulsivity OT Short Term Goal 4 - Progress (Week 1): Met OT Short Term Goal 5 (Week 1): Pt will improve MMT in R UE to 3+/5 Week 2:  OT Short Term Goal 1 (Week 2): Week 2 STG=LTG   Skilled Therapeutic Interventions/Progress Updates:    Pt greeted at time of session sitting up in recliner with family present, no pain. Pt initially requesting schedule for the day and OT looking up schedule, relayed pt only has this OT session today. Politely declined shower, toileting, oral hygiene, etc. Functional mobility room <> ortho gym with wheelchair follow and CGA with RW, cues for proximity and staying in AD. In gym focused on functional movement patterns for RUE hemi side with various weight bearing, gross motor, throwing, ball rolls, etc all with cues to prevent compensatory movements and promote functional movement patterns. Pt also performing [name] activity for untying knots for 1x5 to improve Lindy on R side. Nustep for 5 mins on level 5 for BUE proprioceptive input and strengthening. Back in room set up all needs met call bell in reach.   Therapy Documentation Precautions:  Precautions Precautions: Fall Precaution Comments: R hemiparesis, R  inattention, fragile skin. Restrictions Weight Bearing Restrictions: No    Therapy/Group: Individual Therapy  Viona Gilmore 04/16/2022, 9:52 AM

## 2022-04-17 DIAGNOSIS — K219 Gastro-esophageal reflux disease without esophagitis: Secondary | ICD-10-CM

## 2022-04-17 DIAGNOSIS — M79661 Pain in right lower leg: Secondary | ICD-10-CM

## 2022-04-17 DIAGNOSIS — M79662 Pain in left lower leg: Secondary | ICD-10-CM

## 2022-04-17 LAB — CBC
HCT: 36.6 % — ABNORMAL LOW (ref 39.0–52.0)
Hemoglobin: 12.2 g/dL — ABNORMAL LOW (ref 13.0–17.0)
MCH: 31.8 pg (ref 26.0–34.0)
MCHC: 33.3 g/dL (ref 30.0–36.0)
MCV: 95.3 fL (ref 80.0–100.0)
Platelets: 307 10*3/uL (ref 150–400)
RBC: 3.84 MIL/uL — ABNORMAL LOW (ref 4.22–5.81)
RDW: 13.2 % (ref 11.5–15.5)
WBC: 7.8 10*3/uL (ref 4.0–10.5)
nRBC: 0 % (ref 0.0–0.2)

## 2022-04-17 LAB — BASIC METABOLIC PANEL
Anion gap: 8 (ref 5–15)
BUN: 30 mg/dL — ABNORMAL HIGH (ref 8–23)
CO2: 22 mmol/L (ref 22–32)
Calcium: 8.9 mg/dL (ref 8.9–10.3)
Chloride: 110 mmol/L (ref 98–111)
Creatinine, Ser: 1.52 mg/dL — ABNORMAL HIGH (ref 0.61–1.24)
GFR, Estimated: 44 mL/min — ABNORMAL LOW (ref >60)
Glucose, Bld: 98 mg/dL (ref 70–99)
Potassium: 4.4 mmol/L (ref 3.5–5.1)
Sodium: 140 mmol/L (ref 135–145)

## 2022-04-17 MED ORDER — ENOXAPARIN SODIUM 40 MG/0.4ML IJ SOSY
40.0000 mg | PREFILLED_SYRINGE | INTRAMUSCULAR | Status: DC
  Administered 2022-04-17 – 2022-04-20 (×4): 40 mg via SUBCUTANEOUS
  Filled 2022-04-17 (×4): qty 0.4

## 2022-04-17 NOTE — Progress Notes (Signed)
Occupational Therapy Session Note  Patient Details  Name: Adam Arellano MRN: [id number] Date of Birth: [birth year]  Today's Date: 04/17/2022 OT Individual Time: 1000-1100 OT Individual Time Calculation (min): 60 min   Short Term Goals: Week 2:  OT Short Term Goal 1 (Week 2): Week 2 STG=LTG  Skilled Therapeutic Interventions/Progress Updates:     Pt received resting in wc in good spirits and receptive to skilled OT session with a focus on ADL retraining. Pt reporting 0/10 pain.  Pt ambulated to bathroom with RW close supervision mod verbal cues for proximity, hand placement, and staying in RW. Pt completed 3/3 toileting tasks continent void with CGA.   Pt completed U/LB bathing seated on shower chair with close supervision. Pt required mod verbal cues for pacing, safety, and to utilize RUE during bathing tasks. Pt able to maintain dynamic sitting balance to wash feet and stand to wash bottom while holding grab bar.   Pt completed UB dressing sitting EOB with supervision. Pt required min A to donn pants with re-education provided on hemi-dressing technique. Pt initially impulsively tried to donn pants EOB, unable to maintain sitting balance d/t quickly leaning R/L to don feet into pants and putting both legs into same pant leg. Pt unaware of dressing mistake requiring mod cues to restart task with greater success on second attempt. Pt stood to bring pants to his waist with CGA.   Pt completed grooming tasks at sink- washing his face, applying deodorant, and brushing his hair. Pt required moderate verbal cues for safety to stand up straight when brushing teeth and when using BUE to clean dentures. Pt noted to lean forward to rest head on mirror x2 during task with Pt instructed to complete following grooming tasks at wc level. Pt was left resting in his wc with call bell in reach, seat belt alarm on, and call bell in reach.   Therapy Documentation Precautions:  Precautions Precautions:  Fall Precaution Comments: R hemiparesis, R inattention, fragile skin. Restrictions Weight Bearing Restrictions: No General:   Pain: Pain Assessment Pain Scale: 0-10 Pain Score: 0-No pain ADL: ADL Eating: Set up Where Assessed-Eating: Bed level Grooming: Contact guard Where Assessed-Grooming: Sitting at sink, Standing at sink Upper Body Bathing: Supervision/safety Where Assessed-Upper Body Bathing: Shower Lower Body Bathing: Supervision/safety Where Assessed-Lower Body Bathing: Shower Upper Body Dressing: Supervision/safety Where Assessed-Upper Body Dressing: Edge of bed Lower Body Dressing: Minimal assistance Where Assessed-Lower Body Dressing: Edge of bed Toileting: Contact guard Where Assessed-Toileting: Glass blower/designer: Therapist, music Method: Human resources officer Method: Unable to assess Social research officer, government: Curator Method: Heritage manager: Shower seat with back   Therapy/Group: Individual Therapy  Janey Genta 04/17/2022, 10:59 AM

## 2022-04-17 NOTE — Progress Notes (Signed)
PROGRESS NOTE   Subjective/Complaints:  Recently completed therapy this AM. Sitting in bedside chair. No new complaints this AM.   ROS: Patient denies CP, SOB, N/V/D, abdominal pain, HA,  cough, pain Denies constipation LBM yesterday  Objective:   No results found. Recent Labs    04/17/22 0516  WBC 7.8  HGB 12.2*  HCT 36.6*  PLT 307    Recent Labs    04/14/22 1458 04/17/22 0516  NA 137 140  K 4.4 4.4  CL 111 110  CO2 19* 22  GLUCOSE 108* 98  BUN 40* 30*  CREATININE 1.86* 1.52*  CALCIUM 8.6* 8.9     Intake/Output Summary (Last 24 hours) at 04/17/2022 0938 Last data filed at 04/16/2022 1848 Gross per 24 hour  Intake 460 ml  Output --  Net 460 ml         Physical Exam: Vital Signs Blood pressure (!) 151/91, pulse (!) 59, temperature 98.1 F (36.7 C), resp. rate 18, height '5\' 9"'$  (1.753 m), weight 69.4 kg, SpO2 100 %.   General: No acute distress Mood and affect are appropriate, pleasant  Heart: Regular rate and rhythm no rubs murmurs or extra sounds Lungs: Clear to auscultation, breathing unlabored Abdomen: Normoactive bowel sounds, soft, nontender to palpation, nondistended Extremities: No clubbing, cyanosis, or edema Skin: Right supra orbital ecchymosis-improving   Neuro:  Alert and oriented x 3. Makes eye contact, Normal insight and awareness. Intact Memory. Normal language and speech.Dense Right HH, . RUE 4/5 (RTC injury). LUE 5/5. BLE grossly 4-/5 prox to 5/5 distally.  Senses pain and LT in all 4's. No abnormal tone noted,  Mild dysmetria R FNF Musculoskeletal:reduced ROM Right shoulder , no joint swelling noted   Assessment/Plan: 1. Functional deficits which require 3+ hours per day of interdisciplinary therapy in a comprehensive inpatient rehab setting. Physiatrist is providing close team supervision and 24 hour management of active medical problems listed below. Physiatrist and rehab  team continue to assess barriers to discharge/monitor patient progress toward functional and medical goals  Care Tool:  Bathing    Body parts bathed by patient: Right arm, Left arm, Chest, Abdomen, Front perineal area, Buttocks, Right upper leg, Left upper leg, Right lower leg, Left lower leg, Face         Bathing assist Assist Level: Contact Guard/Touching assist     Upper Body Dressing/Undressing Upper body dressing   What is the patient wearing?: Pull over shirt    Upper body assist Assist Level: Set up assist    Lower Body Dressing/Undressing Lower body dressing      What is the patient wearing?: Pants, Underwear/pull up     Lower body assist Assist for lower body dressing: Minimal Assistance - Patient > 75%     Toileting Toileting    Toileting assist Assist for toileting: Contact Guard/Touching assist     Transfers Chair/bed transfer  Transfers assist     Chair/bed transfer assist level: Contact Guard/Touching assist     Locomotion Ambulation   Ambulation assist      Assist level: Contact Guard/Touching assist Assistive device: Walker-rolling Max distance: 150   Walk 10 feet activity   Assist  Assist level: Contact Guard/Touching assist Assistive device: Walker-rolling   Walk 50 feet activity   Assist Walk 50 feet with 2 turns activity did not occur: Safety/medical concerns  Assist level: Contact Guard/Touching assist Assistive device: Walker-rolling    Walk 150 feet activity   Assist Walk 150 feet activity did not occur: Safety/medical concerns  Assist level: Contact Guard/Touching assist Assistive device: Walker-rolling    Walk 10 feet on uneven surface  activity   Assist     Assist level: Moderate Assistance - Patient - 50 - 74% Assistive device: Other (comment) (ramp handrail)   Wheelchair     Assist Is the patient using a wheelchair?: Yes (transported pt to and from main therapy gym for time  management) Type of Wheelchair: Manual    Wheelchair assist level: Dependent - Patient 0% Max wheelchair distance: 134f    Wheelchair 50 feet with 2 turns activity    Assist        Assist Level: Dependent - Patient 0%   Wheelchair 150 feet activity     Assist      Assist Level: Dependent - Patient 0%   Blood pressure (!) 151/91, pulse (!) 59, temperature 98.1 F (36.7 C), resp. rate 18, height '5\' 9"'$  (1.753 m), weight 69.4 kg, SpO2 100 %.  Medical Problem List and Plan: 1. Functional deficits secondary to acute left posterior cerebral artery infarct             -patient may  shower             -ELOS/Goals: 11/17, supervision goals with PT, OT, SLP  -Continue CIR therapies including PT, OT, and SLP   2.  Antithrombotics: -DVT/anticoagulation:  Pharmaceutical: Heparin             -antiplatelet therapy: aspirin 81 mg and Plavix daily for 3 months then aspirin only 3. Pain Management: Tylenol, oxycodone as needed             -gabapentin 100 mg q HS  -Reports pain under control, continue to monitor 4. Mood/Behavior/Sleep: LCSW to evaluate and provide emotional support             -antipsychotic agents: n/a 5. Neuropsych/cognition: This patient is capable of making decisions on his own behalf. 6. Skin/Wound Care: Routine skin care checks.              -local care to multiple skin tears above             -encourage appropriate nutrition  -asked nurse if we could find some denture cream for him 7. Fluids/Electrolytes/Nutrition: Routine Is and Os and follow-up chemistries 8: Hypertension: monitor TID and prn (home Lasix, Cozaar, Lopressor on hold)             -bp remains elevated extremely dbp  -resumed lopressor at '25mg'$  bid Will resume low dose Cozaar 11/11- recheck BMET Monday  -11/13 Intermittently elevated, continue to monitor trend Vitals:   04/16/22 1923 04/17/22 0332  BP: 129/81 (!) 151/91  Pulse: 88 (!) 59  Resp: 18   Temp: 98.8 F (37.1 C) 98.1 F (36.7  C)  SpO2: 97% 100%    9: Hyperlipidemia: heart healthy diet (patient declined Crestor PTA) follow-up with PCP and neurology 10: CAD s/p NSTEMI/multiple PCI, DES: on DAPT; follows with Dr. SMarlou Porch -11/10 Denies CP today 11: Prostate cancer s/p radical proctatectomy  -seems to be voiding without issues 12: Severe MR s/p TEER: continue Plavix 13: GERD: monitor  -11/13 2x use  of maalox PRN yesterday, reports symptoms improved, continue to monitor 14: CKD stage 3b: baseline creatinine 1.4-1.6; follow-up BMP      Latest Ref Rng & Units 04/17/2022    5:16 AM 04/14/2022    2:58 PM 04/12/2022    6:21 AM  BMP  Glucose 70 - 99 mg/dL 98  108  100   BUN 8 - 23 mg/dL 30  40  38   Creatinine 0.61 - 1.24 mg/dL 1.52  1.86  1.75   Sodium 135 - 145 mmol/L 140  137  138   Potassium 3.5 - 5.1 mmol/L 4.4  4.4  4.4   Chloride 98 - 111 mmol/L 110  111  109   CO2 22 - 32 mmol/L '22  19  22   '$ Calcium 8.9 - 10.3 mg/dL 8.9  8.6  8.7   BUN IVF at noc  -11/13 Cr down to 1.52, BUN down to 30, improved continue current treatment   LOS: 10 days A FACE TO FACE EVALUATION WAS PERFORMED  Jennye Boroughs 04/17/2022, 9:38 AM

## 2022-04-17 NOTE — Discharge Instructions (Addendum)
Inpatient Rehab Discharge Instructions  Adam Arellano Discharge date and time:  04/21/2022  Activities/Precautions/ Functional Status: Activity: no lifting, driving, or strenuous exercise until cleared by MD Diet: cardiac diet Wound Care: none needed  Functional status:  ___ No restrictions     ___ Walk up steps independently ___ 24/7 supervision/assistance   ___ Walk up steps with assistance _x__ Intermittent supervision/assistance  ___ Bathe/dress independently ___ Walk with walker     ___ Bathe/dress with assistance ___ Walk Independently    ___ Shower independently ___ Walk with assistance    __x_ Shower with assistance _x__ No alcohol        Special Instructions: No driving, alcohol consumption or tobacco use.   COMMUNITY REFERRALS UPON DISCHARGE:    Outpatient: PT     OT    [place] Phone: [phone]              Appointment Date/Time: Please allow 3-7 days after discharge for scheduling.     STROKE/TIA DISCHARGE INSTRUCTIONS SMOKING Cigarette smoking nearly doubles your risk of having a stroke & is the single most alterable risk factor  If you smoke or have smoked in the last 12 months, you are advised to quit smoking for your health. Most of the excess cardiovascular risk related to smoking disappears within a year of stopping. Ask you doctor about anti-smoking medications [employer] Quit Line: 1-800-QUIT NOW Free Smoking Cessation Classes [phone]  CHOLESTEROL Know your levels; limit fat & cholesterol in your diet  Lipid Panel     Component Value Date/Time   CHOL 123 04/03/2022 0615   TRIG 38 04/03/2022 0615   HDL 46 04/03/2022 0615   CHOLHDL 2.7 04/03/2022 0615   VLDL 8 04/03/2022 0615   LDLCALC 69 04/03/2022 0615     Many patients benefit from treatment even if their cholesterol is at goal. Goal: Total Cholesterol (CHOL) less than 160 Goal:  Triglycerides (TRIG) less than 150 Goal:  HDL greater than  40 Goal:  LDL (LDLCALC) less than 100   BLOOD PRESSURE American Stroke Association blood pressure target is less that 120/80 mm/Hg  Your discharge blood pressure is:  BP: (!) 155/70 Monitor your blood pressure Limit your salt and alcohol intake Many individuals will require more than one medication for high blood pressure  DIABETES (A1c is a blood sugar average for last 3 months) Goal HGBA1c is under 7% (HBGA1c is blood sugar average for last 3 months)  Diabetes: No known diagnosis of diabetes    Lab Results  Component Value Date   HGBA1C 5.5 04/03/2022    Your HGBA1c can be lowered with medications, healthy diet, and exercise. Check your blood sugar as directed by your physician Call your physician if you experience unexplained or low blood sugars.  PHYSICAL ACTIVITY/REHABILITATION Goal is 30 minutes at least 4 days per week  Activity: Increase activity slowly, Therapies: Physical Therapy: Home Health Return to work: n/a Activity decreases your risk of heart attack and stroke and makes your heart stronger.  It helps control your weight and blood pressure; helps you relax and can improve your mood. Participate in a regular exercise program. Talk with your doctor about the best form of exercise for you (dancing, walking, swimming, cycling).  DIET/WEIGHT Goal is to maintain a healthy weight  Your discharge diet is:  Diet Order  Diet Heart Room service appropriate? Yes; Fluid consistency: Thin  Diet effective now                  thin liquids Your height is:  Height: '5\' 9"'$  (175.3 cm) Your current weight is: Weight: 69.4 kg Your Body Mass Index (BMI) is:  BMI (Calculated): 22.58 Following the type of diet specifically designed for you will help prevent another stroke. Your goal weight range is:   Your goal Body Mass Index (BMI) is 19-24. Healthy food habits can help reduce 3 risk factors for stroke:  High cholesterol, hypertension, and excess weight.  RESOURCES  Stroke/Support Group:  Call [phone]   STROKE EDUCATION PROVIDED/REVIEWED AND GIVEN TO PATIENT Stroke warning signs and symptoms How to activate emergency medical system (call 911). Medications prescribed at discharge. Need for follow-up after discharge. Personal risk factors for stroke. Pneumonia vaccine given: No Flu vaccine given: No My questions have been answered, the writing is legible, and I understand these instructions.  I will adhere to these goals & educational materials that have been provided to me after my discharge from the hospital.     My questions have been answered and I understand these instructions. I will adhere to these goals and the provided educational materials after my discharge from the hospital.  Patient/Caregiver Signature _______________________________ Date __________  Clinician Signature _______________________________________ Date __________  Please bring this form and your medication list with you to all your follow-up doctor's appointments.

## 2022-04-17 NOTE — Progress Notes (Signed)
Physical Therapy Session Note  Patient Details  Name: Adam Arellano MRN: [id number] Date of Birth: [birth year]  Today's Date: 04/17/2022 PT Individual Time: 0800-0845 PT Individual Time Calculation (min): 45 min   Short Term Goals: Week 2:     Skilled Therapeutic Interventions/Progress Updates: Pt presents sitting in w/c and agreeable to therapy.  Pt amb w/ RW x 150' w/ CGA as fatigues, but close supervision initially.  Pt requires increased cueing and CGA as approaches seat 2/2 impulsiveness.  Pt requires seated rest breaks.  Pt performed standing beanbag toss crossing midline w/ 1 LOB to right when reaching right.  Pt performed reaching to right w/ B hands for numbered circles on chair seat.  Pt cued for number and hand to reach and 100% accurate although further right, increased time to locate.  Pt performed standing stepping to 1 of 3 numbered circles in fron, alternating feet.  Pt amb back to room w/ close supervision and verbal cues for visual scanning, posture and then as fatigues, increased cues and CGA.  Pt remained sitting in w/c w/ chair alarm on and all needs in reach.     Therapy Documentation Precautions:  Precautions Precautions: Fall Precaution Comments: R hemiparesis, R inattention, fragile skin. Restrictions Weight Bearing Restrictions: No General:   Vital Signs:  Pain:0/10 Pain Assessment Pain Scale: 0-10 Pain Score: 0-No pain Mobility:     Therapy/Group: Individual Therapy  Ladoris Gene 04/17/2022, 8:48 AM

## 2022-04-17 NOTE — Progress Notes (Signed)
Speech Language Pathology Weekly Progress and Session Note  Patient Details  Name: Adam Arellano MRN: [id number] Date of Birth: 10/14/1931  Beginning of progress report period: April 10, 2022 End of progress report period: April 17, 2022  Today's Date: 04/17/2022 SLP Individual Time: 1102-1145 SLP Individual Time Calculation (min): 43 min  Short Term Goals: Week 1: SLP Short Term Goal 1 (Week 1): Pt will recognize and correct verbal errors at the conversational level with supervision cues SLP Short Term Goal 1 - Progress (Week 1): Not met SLP Short Term Goal 2 (Week 1): Pt will utilize compensatory word finding strategies as needed at the conversational level with supervision cues. SLP Short Term Goal 2 - Progress (Week 1): Not met SLP Short Term Goal 3 (Week 1): Pt will utilize memory compensatory aids to recall daily information with min assist. SLP Short Term Goal 3 - Progress (Week 1): Not met SLP Short Term Goal 4 (Week 1): Pt will complete familiar, mildly complex tasks wtih min assist for functional problem solving. SLP Short Term Goal 4 - Progress (Week 1): Not met SLP Short Term Goal 5 (Week 1): Pt will locate items to the right of midline during functional tasks in >75% accuracy with min assist. SLP Short Term Goal 5 - Progress (Week 1): Not met    New Short Term Goals: Week 2: SLP Short Term Goal 1 (Week 2): STG=LTG due to ELOS 11/17  Weekly Progress Updates: Pt made poor progress, deficits are more severe than presented on evaluation date. SLP has downgraded goals. Pt currently demonstrates reduced awareness of impact of deficits in mildly complex tasks such as medication management. Pt demonstrates poor organization, monitoring of errors and recall. Pt demonstrates difficulty reading due to decoding verse acute vision deficits as well as right inattention. Pt demonstrates difficulty with word finding at sentence and conversation level noting semantic errors.Education  has begun and pt's daughter will provide supervision for all iADLs. Pt would continue to benefit from skilled ST services in order to maximize functional independence and reduce burden of care, requiring 24 hour supervision at discharge with continued skilled ST services.      Intensity: Minumum of 1-2 x/day, 30 to 90 minutes Frequency: 3 to 5 out of 7 days Duration/Length of Stay: 11/17 Treatment/Interventions: Cognitive remediation/compensation;Internal/external aids;Speech/Language facilitation;Environmental controls;Patient/family education   Daily Session  Skilled Therapeutic Interventions:  Skilled ST services focused on cognitive skills. SLP facilitated basic problem solving, error awareness and recall in sorting and displaying requested basic change. Pt required mod A verbal cues for organization, error awareness and problem solving. Pt was able to sequence 4 step picture ADL cards with mod A verbal cues for problem solving/error awareness and able to describe picture scenes with min A semantic cues for word finding. Pt supports current deficits and SLP provided education to slow down during task to allow for processing. SLP downgraded goals due to slower than expected progress. Pt was left in room with call bell within reach and chair alarm set. SLP recommends to continue skilled services.     General    Pain    Therapy/Group: Individual Therapy     04/17/2022, 5:46 AM       

## 2022-04-17 NOTE — Plan of Care (Signed)
  Problem: RH Expression Communication Goal: LTG Patient will increase word finding of common (SLP) Description: LTG:  Patient will increase word finding of common objects/daily info/abstract thoughts with cues using compensatory strategies (SLP). Flowsheets (Taken 04/17/2022 1425) LTG: Patient will increase word finding of common (SLP): Supervision Note: Slower than expected progress   Problem: RH Memory Goal: LTG Patient will use memory compensatory aids to (SLP) Description: LTG:  Patient will use memory compensatory aids to recall biographical/new, daily complex information with cues (SLP) Flowsheets (Taken 04/17/2022 1425) LTG: Patient will use memory compensatory aids to (SLP): Moderate Assistance - Patient 50 - 74% Note: Slower than expected progress   Problem: RH Awareness Goal: LTG: Patient will demonstrate awareness during functional activites type of (SLP) Description: LTG: Patient will demonstrate awareness during functional activites type of (SLP) Flowsheets (Taken 04/17/2022 1425) Patient will demonstrate during cognitive/linguistic activities awareness type of: Emergent LTG: Patient will demonstrate awareness during cognitive/linguistic activities with assistance of (SLP): Minimal Assistance - Patient > 75% Note: Slower than expected progress

## 2022-04-17 NOTE — Progress Notes (Signed)
Occupational Therapy Session Note  Patient Details  Name: Adam Arellano MRN: [id number] Date of Birth: 01/27/1932  Today's Date: 04/17/2022 OT Individual Time: 1345-1430 OT Individual Time Calculation (min): 45 min    Short Term Goals: Week 1:  OT Short Term Goal 1 (Week 2): Week 2 STG=LTG  Skilled Therapeutic Interventions/Progress Updates:    Patient agreeable to participate in OT session. Reports 0/10 pain level.   Patient participated in skilled OT session focusing on [place] re-education. Therapist facilitated increased right side awareness and coordination throughout session in order to improve functional use of the right UE when completing self care tasks. Pt provided with Min A intermittently to utilize only right hand to complete task with VC also provided.  Coordination and strengthening activities: - Resistive clothespins completed with RUE, 1lb wrist weight applied to RUE, seated, pt placed and removed all red and green clothespin from middle and bottom level. Activity modified to compensate for right RTC injury. VC provided to rest elbow on table/chair armrest and utilize elbow flexion to reach appropriate level. [place] Ell[place] and small wooden blocks utilized to complete hand strength and coordination. RUE utilized to place all [place] on table. OT then requested pt locate a specific number of wooden blocks from container and place on top of 8 squigz. Completed with Max difficulty due to motor ataxia and attempting to raise entire UE versus resting extremity on table to compensate for lack of RTC use.  - Standing RUE strengthening and visual motor task completed with colored cones and 1lb wrist weight on RUE to increase right side awareness and motor control needed to complete self care tasks requiring use of right and left visual field.     Therapy Documentation Precautions:  Precautions Precautions: Fall Precaution Comments: R hemiparesis, R inattention, fragile  skin. Restrictions Weight Bearing Restrictions: No Therapy/Group: Individual Therapy    Ailene Ravel, OTR/L,CBIS  Supplemental OT - [place] and [facility]  04/17/2022, 8:27 AM

## 2022-04-18 MED ORDER — FUROSEMIDE 20 MG PO TABS
10.0000 mg | ORAL_TABLET | Freq: Every day | ORAL | Status: DC
  Administered 2022-04-18 – 2022-04-20 (×3): 10 mg via ORAL
  Filled 2022-04-18 (×3): qty 1

## 2022-04-18 NOTE — Progress Notes (Signed)
Occupational Therapy Session Note  Patient Details  Name: Adam Arellano MRN: [id number] Date of Birth: Dec 13, 1931  Today's Date: 04/18/2022 OT Individual Time: 0973-5329 OT Individual Time Calculation (min): 45 min  OT Individual Time: 1400-1500 OT Individual Time Calculation (min): 60 min    Short Term Goals: Week 2:  OT Short Term Goal 1 (Week 2): Week 2 STG=LTG  Skilled Therapeutic Interventions/Progress Updates:     Pt received with DTR in room returning from bathroom. Pt receptive to skilled OT session with a focus on family education and ADL retraining. Pt reporting 0/10 pain during sessions today.  Pt completed morning BADLs with DTR present. Pt and DTR educated on hemi dressing technique and safety during U/LB dressing. Pt required set-up A for UB dressing and CGA for LB dressing. Pt presenting with posterior lean when pulling pants up requiring min A to maintain sitting balance.   Pt completed grooming tasks standing at sink with moderate verbal cues for postural alignment, safety, and pacing during task. Pt able to maintain standing balance throughout activity with stand by A. DTR educated on Pt R sided neglect and impulsively with strategies provided to maintain Pt safety.   Pt ambulated down hallway with close supervision. Pt noted to frequently drop head and veer R during functional mobility requiring moderate cues for safety, hand placement on RW, pacing, and body alignment. Pt completed tub bench transfer with CGA. Education provided on safety during transfer, shower curtain management, and areas for caregiver assist and cueing. Pt ambulated back to room RW in same manner.    Pt was left resting in wc with DTR present, call bell in reach, seat belt alarm on, and all needs met.   Session 2:  Pt received resting in bed motivated to particiapte in skilled OT session with a focus on NMR, dynamic standing balance, and safety awareness. Pt transported to therapy gym total A  wc.   Pt completed seated AAROM shoulder flexion an abduction exercises 2x10 with scapular rotation facilitation from OT. Pt able to actively flex/abduct shoulder to 90 degrees requiring min A to CGA to bring shoulder >90.   Pt completed shoulder protraction/retraction standing at elevated table CGA with RUE placed on yoga block. Pt instructed to slide yoga block to hit targets placed on table to facilitated targeted reach practice.   Pt completed standing shoulder mobilization activity using wash cloth to glide arm on vertically slanted board. Pt able to flex arm to ~140 degrees with maximal effort and intermittent min A.   Pt completed functional reach activity placing and removing horse shoes from vertical bar placed at shoulder level while standing at elevated table. Pt required CGA to min A to lift arm during task. Pt was transported back to room in wc total A and left resting in bed with call bell in reach, bed alarm on, and all needs met.    Therapy Documentation Precautions:  Precautions Precautions: Fall Precaution Comments: R hemiparesis, R inattention, fragile skin. Restrictions Weight Bearing Restrictions: No General:   Vital Signs: Therapy Vitals Temp: 97.6 F (36.4 C) Temp Source: Oral Pulse Rate: 63 Resp: 16 BP: (Abnormal) 157/97 Patient Position (if appropriate): Lying Oxygen Therapy SpO2: 100 % O2 Device: Room Air Pain: Pain Assessment Pain Scale: 0-10 Pain Score: 0-No pain ADL: ADL Eating: Set up Where Assessed-Eating: Bed level Grooming: Contact guard Where Assessed-Grooming: Sitting at sink, Standing at sink Upper Body Bathing: Supervision/safety Where Assessed-Upper Body Bathing: Shower Lower Body Bathing: Supervision/safety Where Assessed-Lower Body  Bathing: Shower Upper Body Dressing: Supervision/safety Where Assessed-Upper Body Dressing: Edge of bed Lower Body Dressing: Minimal assistance Where Assessed-Lower Body Dressing: Edge of  bed Toileting: Contact guard Where Assessed-Toileting: Glass blower/designer: Therapist, music Method: Human resources officer Method: Unable to assess Social research officer, government: Curator Method: Heritage manager: Shower seat with back   Therapy/Group: Individual Therapy  Janey Genta 04/18/2022, 7:53 AM

## 2022-04-18 NOTE — Progress Notes (Signed)
Patient ID: Adam Arellano, male   DOB: [birth year], 88 y.o.   MRN: [id number]  OP referral sent to [place]: [phone] F: [phone]

## 2022-04-18 NOTE — Progress Notes (Signed)
Physical Therapy Weekly Progress Note  Patient Details  Name: Adam Arellano MRN: [id number] Date of Birth: 02/27/1932  Beginning of progress report period: April 08, 2022 End of progress report period: April 18, 2022  Today's Date: 04/18/2022 PT Individual Time: 4665-9935 PT Individual Time Calculation (min): 60 min   Patient has met 5 of 5 short term goals.    Adam Arellano is progressing well with therapy. He is currently mod I with bed mobility, CGA with sit<>stand and bed to chair transfers using the RW, and CGA with amb with RW up to 163f and with stair negotiation with RW up to 2 steps. Pt continues move impulsively at times, which requires frequent cuing. While ambulating the pt requires frequent cues for safe RW management due to the pt allowing the RW to get too far away from COM. He also requires frequent cues to improve posture and decrease cadence to improve safety. Pt would benefit from contined skill CIR level therapy to address impairments. Planning to DC home with daughter providing 24 hour support and OPPT.    Patient continues to demonstrate the following deficits muscle weakness, decreased cardiorespiratoy endurance, decreased motor planning,  , decreased attention to right, decreased safety awareness and decreased memory,  , and decreased standing balance and therefore will continue to benefit from skilled PT intervention to increase functional independence with mobility.  Patient progressing toward long term goals..  Continue plan of care.  PT Short Term Goals Week 1:  PT Short Term Goal 1 (Week 1): Pt will perform bed mobility with mod i consistently. PT Short Term Goal 1 - Progress (Week 1): Met PT Short Term Goal 2 (Week 1): Pt will perform sit<>stand transfers with CGA and LRAD consistently. PT Short Term Goal 2 - Progress (Week 1): Met PT Short Term Goal 3 (Week 1): Pt will perform bed<>chair transfers with CGA and LRAD consistently. PT Short Term Goal 3 -  Progress (Week 1): Met PT Short Term Goal 4 (Week 1): Pt will ambulate 563fwith min assist and LRAD consistently. PT Short Term Goal 4 - Progress (Week 1): Met PT Short Term Goal 5 (Week 1): Pt will perform stair negotiation, 1 step, with min assist consistenly. PT Short Term Goal 5 - Progress (Week 1): Met Week 2:  PT Short Term Goal 1 (Week 2): = to LTGs due to ELOS.  Skilled Therapeutic Interventions/Progress Updates:  Ambulation/gait training;Cognitive remediation/compensation;Discharge planning;DME/adaptive equipment instruction;Functional mobility training;Pain management;Psychosocial support;Splinting/orthotics;Therapeutic Activities;UE/LE Strength taining/ROM;Visual/perceptual remediation/compensation;Balance/vestibular training;Community reintegration;Disease management/prevention;Functional electrical stimulation;Neuromuscular re-education;Patient/family education;Skin care/wound management;Stair training;Therapeutic Exercise;UE/LE Coordination activities;Wheelchair propulsion/positioning   [facility] present and agreeable to therapy. No c/o pain throughout session.   Family education given today on the following:  -Gait belt use and guarding on the right side with all mobility due to increased LOB to the right. Pts daughter demonstrating safe and correct guarding throughout session.  -Sit<>stand transfers from [facility] Tricitieseducated pt and daughter of at least one hand pushing off from seated surface before standing and at least one hand reaching back to seated surface prior to sitting down. Pt does well coming from sitting to stand with hand placement. However, required constant cuing for hand placement when going from standing>sitting. Pts daughter able to guard and give cues with appropriate positioning and timing.  -Gait: Pts daughter educated on pt needing frequent cuing for improved posture and RW management. Pt amb with RW and daughter guarding 15043f with no LOB,  daughter guarding safely and  giving excellent cuing to patient.  -Car transfers: SPT demonstrated safe car transfer to pt and daughter first. Pt then performed with daughter showing excellent guarding and cuing technique.  - Ramp: Pt using RW and daughter guarding safely and giving excellent cuing regarding management of RW and decreasing gait speed when walking down ramp.  - stair negotiation, two steps and one step with no HR per home set up. SPT demonstrated correct technique and sequencing with RW first. Pt and daughter then performed 2 steps x 3 (home set up up to porch) with daughter guarding safely and giving good cuing. SPT giving increased cuing with first and second descent for RW placement, and hand and foot placement. Pt requiring cuing to slow down and handle place RW gently. 1 step (step from porch to home) then performed with SPT demonstration first and pt and daughter practicing x4 with good technique and safety.   Edu given to daughter regarding pts R inattention as well as pts history of R hip pain with walking.   Daughter verbalized understanding of all education and stated she had no further questions at the completion of the session.   Pt left seated in [facility] with daughter present, call bell in reach, brakes locked, all needs met.   Therapy Documentation Precautions:  Precautions Precautions: Fall Precaution Comments: R hemiparesis, R inattention, fragile skin. Restrictions Weight Bearing Restrictions: No  Therapy/Group: Individual Therapy  Kayleen Memos, SPT 04/18/2022, 7:57 AM

## 2022-04-18 NOTE — Progress Notes (Signed)
PROGRESS NOTE   Subjective/Complaints:  DIscussed renal labs, pt drinking much better 872m recorded yesterday , currently getting IVF at 768mhr  ROS: Patient denies CP, SOB, N/V/D, abdominal pain, HA,  cough, pain Denies constipation LBM yesterday  Objective:   No results found. Recent Labs    04/17/22 0516  WBC 7.8  HGB 12.2*  HCT 36.6*  PLT 307    Recent Labs    04/17/22 0516  NA 140  K 4.4  CL 110  CO2 22  GLUCOSE 98  BUN 30*  CREATININE 1.52*  CALCIUM 8.9     Intake/Output Summary (Last 24 hours) at 04/18/2022 0756 Last data filed at 04/17/2022 2214 Gross per 24 hour  Intake --  Output 175 ml  Net -175 ml         Physical Exam: Vital Signs Blood pressure (!) 157/97, pulse 63, temperature 97.6 F (36.4 C), temperature source Oral, resp. rate 16, height '5\' 9"'$  (1.753 m), weight 69.4 kg, SpO2 100 %.   General: No acute distress Mood and affect are appropriate, pleasant  Heart: Regular rate and rhythm no rubs murmurs or extra sounds Lungs: Clear to auscultation, breathing unlabored Abdomen: Normoactive bowel sounds, soft, nontender to palpation, nondistended Extremities: No clubbing, cyanosis, 1+ pre tibial edema Skin: Right supra orbital ecchymosis-improving   Neuro:  Alert and oriented x 3. Makes eye contact, Normal insight and awareness. Intact Memory. Normal language and speech.Dense Right HH, . RUE 4/5 (RTC injury). LUE 5/5. BLE grossly 4-/5 prox to 5/5 distally.  Senses pain and LT in all 4's. No abnormal tone noted,  Mild dysmetria R FNF Musculoskeletal:reduced ROM Right shoulder , no joint swelling noted   Assessment/Plan: 1. Functional deficits which require 3+ hours per day of interdisciplinary therapy in a comprehensive inpatient rehab setting. Physiatrist is providing close team supervision and 24 hour management of active medical problems listed below. Physiatrist and rehab team  continue to assess barriers to discharge/monitor patient progress toward functional and medical goals  Care Tool:  Bathing    Body parts bathed by patient: Right arm, Left arm, Chest, Abdomen, Front perineal area, Buttocks, Right upper leg, Left upper leg, Right lower leg, Left lower leg, Face         Bathing assist Assist Level: Contact Guard/Touching assist     Upper Body Dressing/Undressing Upper body dressing   What is the patient wearing?: Pull over shirt    Upper body assist Assist Level: Set up assist    Lower Body Dressing/Undressing Lower body dressing      What is the patient wearing?: Pants, Underwear/pull up     Lower body assist Assist for lower body dressing: Minimal Assistance - Patient > 75%     Toileting Toileting    Toileting assist Assist for toileting: Contact Guard/Touching assist     Transfers Chair/bed transfer  Transfers assist     Chair/bed transfer assist level: Contact Guard/Touching assist     Locomotion Ambulation   Ambulation assist      Assist level: Contact Guard/Touching assist Assistive device: Walker-rolling Max distance: 150   Walk 10 feet activity   Assist     Assist level: Contact  Guard/Touching assist Assistive device: Walker-rolling   Walk 50 feet activity   Assist Walk 50 feet with 2 turns activity did not occur: Safety/medical concerns  Assist level: Contact Guard/Touching assist Assistive device: Walker-rolling    Walk 150 feet activity   Assist Walk 150 feet activity did not occur: Safety/medical concerns  Assist level: Contact Guard/Touching assist Assistive device: Walker-rolling    Walk 10 feet on uneven surface  activity   Assist     Assist level: Moderate Assistance - Patient - 50 - 74% Assistive device: Other (comment) (ramp handrail)   Wheelchair     Assist Is the patient using a wheelchair?: Yes (transported pt to and from main therapy gym for time management) Type  of Wheelchair: Manual    Wheelchair assist level: Dependent - Patient 0% Max wheelchair distance: 188f    Wheelchair 50 feet with 2 turns activity    Assist        Assist Level: Dependent - Patient 0%   Wheelchair 150 feet activity     Assist      Assist Level: Dependent - Patient 0%   Blood pressure (!) 157/97, pulse 63, temperature 97.6 F (36.4 C), temperature source Oral, resp. rate 16, height '5\' 9"'$  (1.753 m), weight 69.4 kg, SpO2 100 %.  Medical Problem List and Plan: 1. Functional deficits secondary to acute left posterior cerebral artery infarct             -patient may  shower             -ELOS/Goals: 11/17, supervision goals with PT, OT, SLP  -Continue CIR therapies including PT, OT, and SLP   2.  Antithrombotics: -DVT/anticoagulation:  Pharmaceutical: Heparin             -antiplatelet therapy: aspirin 81 mg and Plavix daily for 3 months then aspirin only 3. Pain Management: Tylenol, oxycodone as needed             -gabapentin 100 mg q HS  -Reports pain under control, continue to monitor 4. Mood/Behavior/Sleep: LCSW to evaluate and provide emotional support             -antipsychotic agents: n/a 5. Neuropsych/cognition: This patient is capable of making decisions on his own behalf. 6. Skin/Wound Care: Routine skin care checks.              -local care to multiple skin tears above             -encourage appropriate nutrition  -asked nurse if we could find some denture cream for him 7. Fluids/Electrolytes/Nutrition: Routine Is and Os and follow-up chemistries 8: Hypertension: monitor TID and prn (home Lasix, Cozaar, Lopressor on hold)           -resumed lopressor at '25mg'$  bid  Vitals:   04/17/22 1850 04/18/22 0528  BP: 130/61 (!) 157/97  Pulse: 83 63  Resp: 16 16  Temp: 98.2 F (36.8 C) 97.6 F (36.4 C)  SpO2: 99% 100%    9: Hyperlipidemia: heart healthy diet (patient declined Crestor PTA) follow-up with PCP and neurology 10: CAD s/p  NSTEMI/multiple PCI, DES: on DAPT; follows with Dr. SMarlou Porch -11/10 Denies CP today 11: Prostate cancer s/p radical proctatectomy  -seems to be voiding without issues 12: Severe MR s/p TEER: continue Plavix 13: GERD: monitor  -11/13 2x use of maalox PRN yesterday, reports symptoms improved, continue to monitor 14: CKD stage 3b: baseline creatinine 1.4-1.6; follow-up BMP      Latest Ref  Rng & Units 04/17/2022    5:16 AM 04/14/2022    2:58 PM 04/12/2022    6:21 AM  BMP  Glucose 70 - 99 mg/dL 98  108  100   BUN 8 - 23 mg/dL 30  40  38   Creatinine 0.61 - 1.24 mg/dL 1.52  1.86  1.75   Sodium 135 - 145 mmol/L 140  137  138   Potassium 3.5 - 5.1 mmol/L 4.4  4.4  4.4   Chloride 98 - 111 mmol/L 110  111  109   CO2 22 - 32 mmol/L '22  19  22   '$ Calcium 8.9 - 10.3 mg/dL 8.9  8.6  8.7   11/14 BUN/Creat at baseline  Starting to get pretib edema, d/c IVF, resume lasix but at low dose , recheck BMET 11/16  LOS: 11 days A FACE TO [place] Adam Arellano 04/18/2022, 7:56 AM

## 2022-04-18 NOTE — Progress Notes (Signed)
Speech Language Pathology Daily Session Note  Patient Details  Name: Adam Arellano MRN: [id number] Date of Birth: 02/04/1932  Today's Date: 04/18/2022 SLP Individual Time: 1121-1205 SLP Individual Time Calculation (min): 44 min  Short Term Goals: Week 2: SLP Short Term Goal 1 (Week 2): STG=LTG due to ELOS 11/17  Skilled Therapeutic Interventions: Skilled ST treatment focused on cognitive goals and education with pt's daughter. SLP facilitated session by providing education regarding patient's current deficits and their impact on attention, visual scanning, problem solving, awareness, memory and overall safety. SLP reinforced the importance of 24 hour supervision to ensure safe decision making as patient has decreased awareness of deficits. SLP provided strategies to maximize attention, attention to right field of environment/body, recall, problem solving, and overall safety with functional tasks. SLP also discussed cognitive activities/functional tasks patient can perform with assistance at home for ongoing improvement of pt's overall cognitive functioning. All verbalized understanding of education.  SLP facilitated a PEG design task with overall min A verbal cues for correct placement, selecting correct colored PEGs in field of 2, error awareness and correction. Pt appeared to enjoy task and SLP provided education to daughter on ways to facilitate such task at home with varying levels of support.   SLP also facilitated a divergent naming task with overall sup for word finding and use of description strategy.  Patient was left in wheelchair with alarm activated and immediate needs within reach at end of session. Continue per current plan of care.      Pain None/denied   Therapy/Group: Individual Therapy  Patty Sermons 04/18/2022, 3:58 PM

## 2022-04-19 NOTE — Progress Notes (Addendum)
Occupational Therapy Session Note  Patient Details  Name: Adam Arellano MRN: [id number] Date of Birth: 03-09-1932  Today's Date: 04/19/2022 OT Individual Time: 09:07-10:05 OT Individual Time Calculation (min): 58 min  OT Individual Time: 1415-1500 OT Individual Time Calculation (min): 45 min   Short Term Goals: Week 2:  OT Short Term Goal 1 (Week 2): Week 2 STG=LTG  Skilled Therapeutic Interventions/Progress Updates:     Session 1: Pt received resting in wc following breakfast. Pt in good spirits and motivated to participate in skilled OT session reporting 0/10 pain. Pt requesting to complete a shower this AM.   BADLs: Pt ambulated to bathroom with RW close supervision with mod verbal cues for hand placement and to stay within his walker. Pt completed U/LB bathing with close supervision requiring min verbal cues for pacing and safety when standing to wash body. Pt completed UB dressing seated in wc with set-up assist. Pt completed LB dressing with close supervision requiring verbal cues for safety when bending over to don socks, standing to pull pants to his waist, and for pacing.  Pt completed grooming tasks applying deodorant, brushing teeth, and brushing hair with close supervision. Pt demonstrating improved attention to R side and improved functional use of RUE during tasks lifting RUE 90 degrees to apply deodorant. Pt able to manipulate tooth paste, tooth brush, and dentures with min verbal cues. Pt able to maintain balance while using BUEs to open/close mouth wash and tooth paste.   Therapeutic exercise: Pt completed shoulder mobilization exercises in room. Pt completed 2x10 reps of BUE shoulder abduction glides on wall with scapular mobilization facilitated by OT and 2x10 reps of shoulder protraction/retraction with hands on wall in modified push-up position. Pt was left resting in wc with call bell in reach, seat belt alarm on, and all needs met.   Session 2: Pt received resting in  wc with DTR present in room. Pt in good spirits and receptive to skilled OT session reporting 0/10 pain. Pt transported to therapy gym in wc total A.   Pt completed shoulder mobilization and targeted reach activities standing at elevated table with CGA. Pt performed AAROM shoulder flexion and abduction with scapular mobilization facilitation from OT 2x10 reps min A.   Pt completed standing shoulder protraction and retraction exercises making circles on table using washcloth 2x10. Pt completed targeted reach rolling weighted ball on elevated table to hit targets placed in front, to the R, and L of the Pt. Pt able to maintain dynamic standing balance CGA during activity.  Pt practiced seated functional reach EOM. Pt instructed to reach for cones placed inferiorly to the R of the Pt and stack them on elevated table at shoulder height. Pt required mod A to stack cone with dysmetric arm movements occurring sporadically during activity. Pt then removed stacked cones and placed them to his R with min A provided shoulder level.  Pt was transported back to his room total A in wc and left in wc with call bell in reach, DTR present, and all needs met.   Therapy Documentation Precautions:  Precautions Precautions: Fall Precaution Comments: R hemiparesis, R inattention, fragile skin. Restrictions Weight Bearing Restrictions: No General:     ADL: ADL Eating: Set up Where Assessed-Eating: Bed level Grooming: Contact guard Where Assessed-Grooming: Sitting at sink, Standing at sink Upper Body Bathing: Supervision/safety Where Assessed-Upper Body Bathing: Shower Lower Body Bathing: Supervision/safety Where Assessed-Lower Body Bathing: Shower Upper Body Dressing: Supervision/safety Where Assessed-Upper Body Dressing: Edge of bed Lower  Body Dressing: Minimal assistance Where Assessed-Lower Body Dressing: Edge of bed Toileting: Contact guard Where Assessed-Toileting: Glass blower/designer: Medical laboratory scientific officer Method: Human resources officer Method: Unable to assess Social research officer, government: Curator Method: Heritage manager: Shower seat with back   Therapy/Group: Individual Therapy  Janey Genta 04/19/2022, 9:46 AM

## 2022-04-19 NOTE — Progress Notes (Signed)
Patient ID: Adam Arellano, male   DOB: July 29, 1931, 88 y.o.   MRN: [id number]  Team Conference Report to Patient/Family  Team Conference discussion was reviewed with the patient and caregiver, including goals, any changes in plan of care and target discharge date.  Patient and caregiver express understanding and are in agreement.  The patient has a target discharge date of 04/21/22.  SW met with patient and provided team conference updates. SW called patient daughter, unable to leave a VM. Patient ready for d/c. No additional questions or concerns.   Dyanne Iha 04/19/2022, 2:13 PM

## 2022-04-19 NOTE — Progress Notes (Signed)
Physical Therapy Session Note  Patient Details  Name: Adam Arellano MRN: [id number] Date of Birth: 1932/05/11  Today's Date: 04/19/2022 PT Individual Time: 1030-1314 PT Individual Time Calculation (min): 42 min   Short Term Goals: Week 2:  PT Short Term Goal 1 (Week 2): = to LTGs due to ELOS.  Skilled Therapeutic Interventions/Progress Updates:     Pt greeted sitting in [facility] and agreeable to therapy. No c/o pain throughout session.   Sit>stand from [facility] to RW with correct hand placement and close SPV.   Gait training:  227f, 5029fwith RW and CGA. Pt showing improved ability to manage walker evident by requiring less cuing to keep RW closer while ambulating. Pt has trouble with RW management mainly when turning. Less cues required for upright posture today as well.   Car transfer:  RW with SPV and minimal cuing for sequencing and RW position. Pt able to bring legs in and out of the car with no assist.   [place] and demonstrates increased fall risk as noted by score of 28/56, indicating recommendation to use RW full time.  (<36= high risk for falls, close to 100%; 37-45 significant >80%; 46-51 moderate >50%; 52-55 lower >25%). See details below. Previous score at evaluation: 7/56.   Upon returning to room pt stated he'd like to use the restroom. Pt able to amb into bathroom, set up RW, stand at toilet and manage LB dressing with close SPV. Pt continent of bladder. Upon exiting bathroom pt has trouble maintaining RW at a safe distance from body and is impulsive when walking to sink to wash his hands. Max cuing for RW management back to [facility]>sit with SPV and cues for hand placement due to pt not reaching back to [facility] to sitting.   Pt left sitting in WC, brakes locked, call bell in reach, daughter present, all needs met.    Therapy Documentation Precautions:  Precautions Precautions: Fall Precaution Comments: R  hemiparesis, R inattention, fragile skin. Restrictions Weight Bearing Restrictions: No    Balance: Balance Balance Assessed: Yes Standardized Balance Assessment Standardized Balance Assessment: Berg Balance Test Berg Balance Test Sit to Stand: Able to stand  independently using hands Standing Unsupported: Able to stand 2 minutes with supervision Sitting with Back Unsupported but Feet Supported on Floor or Stool: Able to sit safely and securely 2 minutes Stand to Sit: Controls descent by using hands Transfers: Able to transfer with verbal cueing and /or supervision Standing Unsupported with Eyes Closed: Able to stand 10 seconds with supervision Standing Ubsupported with Feet Together: Able to place feet together independently but unable to hold for 30 seconds From Standing, Reach Forward with Outstretched Arm: Reaches forward but needs supervision From Standing Position, Pick up Object from Floor: Able to pick up shoe, needs supervision From Standing Position, Turn to Look Behind Over each Shoulder: Turn sideways only but maintains balance Turn 360 Degrees: Needs close supervision or verbal cueing Standing Unsupported, Alternately Place Feet on Step/Stool: Able to complete >2 steps/needs minimal assist Standing Unsupported, One Foot in Front: Loses balance while stepping or standing Standing on One Leg: Unable to try or needs assist to prevent fall Total Score: 28  Therapy/Group: Individual Therapy  AnKayleen MemosSPT 04/19/2022, 1:53 PM

## 2022-04-19 NOTE — Progress Notes (Signed)
Speech Language Pathology Daily Session Note  Patient Details  Name: Adam Arellano MRN: [id number] Date of Birth: 1931-11-29  Today's Date: 04/19/2022 SLP Individual Time: 0800-0900 SLP Individual Time Calculation (min): 60 min  Short Term Goals: Week 2: SLP Short Term Goal 1 (Week 2): STG=LTG due to ELOS 11/17  Skilled Therapeutic Interventions: Skilled ST treatment focused on cognitive-linguistic goals. Pt greeted in bed on arrival. Pt transferred to wheelchair with CGA. SLP facilitated set-up + sup A for lower and upper body dressing, sup A for washing face and applying denture adhesive at the sink. SLP then facilitated functional recall and use of external memory aids to recall list of items to locate in hospital gift shop. With immediate recall, pt recalled 3/5 items successfully without cue and 5/5 with sup A semantic cues. Following 5 minute delay, pt required moderate verbal and visual cues for use of external aid to recall list. Hospital gift shop was closed and therefore unable to complete second part to task involving actually locating items in gift shop in effort to address visual scanning skills. Pt actively participated in higher level word finding task through divergent naming task with sup A verbal cues for occasional word finding hesitation. SLP educated further on compensatory memory strategies using internal and external aids. Pt was known to repeat himself throughout session reminding SLP of his upcoming discharge date, sometimes with awareness and others "oh, I already told you that." At end of session pt ambulated to bathroom using RW and sup A to void bladder. Pt washed hands at sink with device and was left in wheelchair with alarm activated and immediate needs within reach at end of session. Continue per current plan of care.      Pain  None/denied  Therapy/Group: Individual Therapy  Patty Sermons 04/19/2022, 8:24 AM

## 2022-04-19 NOTE — Progress Notes (Signed)
Speech Language Pathology Discharge Summary  Patient Details  Name: Adam Arellano MRN: [id number] Date of Birth: 01-23-1932  Date of Discharge from SLP service:April 20, 2022  {chl ip rehab slp time calculations:[id number]}   Skilled Therapeutic Interventions: Skilled ST treatment focused on *** goals.  Patient was left in *** with alarm activated and immediate needs within reach at end of session.   Patient has met 4 of 4 long term goals.  Patient to discharge at overall Mod;Min level.  Reasons goals not met: all goals met   Clinical Impression/Discharge Summary: Patient has made functional gains and has met 4 of 4 long-term goals this admission. Pt is currently completing basic level cognitive-linguistic tasks with supervision-to-mod A in regards to problem solving, functional recall, and awareness. Pt exhibits occasional word finding hesitation and required sup A verbal cues to implement word finding strategies, along with extended time. Patient and family education is complete and patient to discharge at overall min-to-mod level from a cognitive-linguistic perspective. Patient's care partner is independent to provide the necessary physical and cognitive assistance at discharge. Patient would benefit from continued SLP services in outpatient setting to maximize cognitive-linguistic and communciation skills and functional independence.   Care Partner:  Caregiver Able to Provide Assistance: Yes  Type of Caregiver Assistance: Physical;Cognitive  Recommendation:  24 hour supervision/assistance;Home Health SLP  Rationale for SLP Follow Up: Reduce caregiver burden;Maximize cognitive function and independence;Maximize functional communication   Equipment: none   Reasons for discharge: Treatment goals met   Patient/Family Agrees with Progress Made and Goals Achieved: Yes    Elpidia Karn T Samamtha Tiegs 04/19/2022, 4:50 PM

## 2022-04-19 NOTE — Patient Care Conference (Signed)
Inpatient RehabilitationTeam Conference and Plan of Care Update Date: 04/19/2022   Time: 10:40 AM    Patient Name: Adam Arellano      Medical Record Number: [id number]  Date of Birth: 1932-01-19 Sex: Male         Room/Bed: 4M06C/4M06C-01 Payor Info: Payor: Marine scientist / Plan: [facility] MEDICARE / Product Type: *No Product type* /    Admit Date/Time:  04/07/2022  2:43 PM  Primary Diagnosis:  Acute ischemic left posterior cerebral artery (PCA) stroke [facility])  Hospital Problems: Principal Problem:   Acute ischemic left posterior cerebral artery (PCA) stroke ([place]) Active Problems:   Primary hypertension   Stage 3b chronic kidney disease [facility])    Expected Discharge Date: Expected Discharge Date: 04/21/22  Team Members Present: Physician leading conference: Dr. Alysia Penna Social Worker Present: Erlene Quan, BSW Nurse Present: Dorien Chihuahua, RN PT Present: Page Spiro, PT OT Present: Other (comment) Lowella Fairy, OT) SLP Present: Sherren Kerns, SLP PPS Coordinator present : Gunnar Fusi, SLP     Current Status/Progress Goal Weekly Team Focus  Bowel/Bladder   Cont xs 2   Remain cont   Assist with toileting needs    Swallow/Nutrition/ Hydration               ADL's   U/LB bathing supervision, UB dressing set-up, LB dressing Min A, Grooming supervision; requries min verbal cues for safety awareness; impulsive during LB dressing and grooming tasks; improved strength and coordination in RUE with occasionaly ataxia during functional reach  Supervision   NMR-education, dynamic standing balance, safety awareness, ADL retraining, dynamic standing balance, UB strength/VMC    Mobility   family training performed 11/14, Continues to be impulsive but good response to cuing. CGA with sit<>stand and bed to chair transfers, CGA with amb up to 167f with RW and 2 step stair negotiation with RW. Heavy cuing needed during gait and stairs for RW management, and for  hand placement during transfers.   CGA with dynamic standing, walking and 2 steps with LRAD, SPV with transfers, mod i with bed mobility.  family training, gait training, balance training, star training, bed mobility and transfer training, R hemibody NMR    Communication   sup A   sup A   word finding, education    Safety/Cognition/ Behavioral Observations  min-to-mod A for basic tasks   sup-to-mod A   problem solving, functional recall, awareness, education    Pain   No c/o pain   remain pain free   Assess PRN and Q shift    Skin   RUE skin tear   Prevent further breakdown  Assess Q Shift and PRN      Discharge Planning:  Discharging home with Daughter and SIL on Friday   Team Discussion: Patient with hemiparesis post left PICA CVA with poor awareness of deficits. Patient on target to meet rehab goals: yes, at goal level  *See Care Plan and progress notes for long and short-term goals.   Revisions to Treatment Plan:  N/a  Teaching Needs: Safety, medications, dietary modifications, transfers, toileting, etc  Current Barriers to Discharge: Decreased caregiver support  Possible Resolutions to Barriers: Family education OP follow up services DME; has DME     Medical Summary Current Status: Right upper extremity coordination improving somewhat, impulsive been improving, hypertension normalizing  Barriers to Discharge: Self-care education;Inadequate Nutritional Intake   Possible Resolutions to BCel[employer] CorporationFocus: Encourage oral intake and recheck renal function test off IV fluids prior to discharge  Continued Need for Acute Rehabilitation Level of Care: The patient requires daily medical management by a physician with specialized training in physical medicine and rehabilitation for the following reasons: Direction of a multidisciplinary physical rehabilitation program to maximize functional independence : Yes Medical management of patient stability for  increased activity during participation in an intensive rehabilitation regime.: Yes Analysis of laboratory values and/or radiology reports with any subsequent need for medication adjustment and/or medical intervention. : Yes   I attest that I was present, lead the team conference, and concur with the assessment and plan of the team.   Dorien Chihuahua B 04/19/2022, 3:45 PM

## 2022-04-19 NOTE — Progress Notes (Signed)
Physical Therapy Session Note  Patient Details  Name: Adam Arellano MRN: [id number] Date of Birth: May 22, 1932  Today's Date: 04/19/2022 PT Individual Time: 1030-1059 PT Individual Time Calculation (min): 29 min   Short Term Goals: Week 2:  PT Short Term Goal 1 (Week 2): = to LTGs due to ELOS.  Skilled Therapeutic Interventions/Progress Updates:     Pt received seated in [facility] and agrees to therapy. No complaint of pain. WC transport to gym for time management. Pt performs sit to stand with CG and cues for sequencing. Pt ambulates x200' with RW, primarily with CGA and cues for upright gaze to improve posture and balance, but requiring minA/modA as pt approaches WC due to difficulty safely sequencing transition.   Following rest break, pt performs alternating toe taps on colored cones, with use of RW. Pt requires minA for stability and cues to attend to cone on R, as pt initially taps both toes on L cone. Pt also has increased difficulty with coordination on R, knocking cone over multiple times during process of x20 alternating toe taps. Activity progressed by having pt perform toe taps without RW for upper extremity support. Pt requires modA on R side due to R lateral and posterior bias, with tactile cues for upright posture and mildine orientation. 2x20 with seated rest break.  WC transport back to room. Left seated with alarm intact and all needs within reach.  Therapy Documentation Precautions:  Precautions Precautions: Fall Precaution Comments: R hemiparesis, R inattention, fragile skin. Restrictions Weight Bearing Restrictions: No   Therapy/Group: Individual Therapy  Breck Coons, PT, DPT 04/19/2022, 3:53 PM

## 2022-04-19 NOTE — Progress Notes (Signed)
PROGRESS NOTE   Subjective/Complaints:  No issues overnite , feels ready for d/c later this week   ROS: Patient denies CP, SOB, N/V/D, abdominal pain, HA,  cough, pain Denies constipation LBM yesterday  Objective:   No results found. Recent Labs    04/17/22 0516  WBC 7.8  HGB 12.2*  HCT 36.6*  PLT 307    Recent Labs    04/17/22 0516  NA 140  K 4.4  CL 110  CO2 22  GLUCOSE 98  BUN 30*  CREATININE 1.52*  CALCIUM 8.9     Intake/Output Summary (Last 24 hours) at 04/19/2022 0726 Last data filed at 04/18/2022 1744 Gross per 24 hour  Intake 600 ml  Output 200 ml  Net 400 ml         Physical Exam: Vital Signs Blood pressure (!) 142/75, pulse 66, temperature 98.4 F (36.9 C), temperature source Oral, resp. rate 20, height _0  (1.753 m), weight 69.4 kg, SpO2 100 %.   General: No acute distress Mood and affect are appropriate, pleasant  Heart: Regular rate and rhythm no rubs murmurs or extra sounds Lungs: Clear to auscultation, breathing unlabored Abdomen: Normoactive bowel sounds, soft, nontender to palpation, nondistended Extremities: No clubbing, cyanosis, 1+ pre tibial edema Skin: Right supra orbital ecchymosis-improving   Neuro:  Alert and oriented x 3. Makes eye contact, Normal insight and awareness. Intact Memory. Normal language and speech.Dense Right HH, . RUE 4/5 (RTC injury). LUE 5/5. BLE grossly 4-/5 prox to 5/5 distally.  Senses pain and LT in all 4's. No abnormal tone noted,  Mild dysmetria R FNF Musculoskeletal:reduced ROM Right shoulder , no joint swelling noted   Assessment/Plan: 1. Functional deficits which require 3+ hours per day of interdisciplinary therapy in a comprehensive inpatient rehab setting. Physiatrist is providing close team supervision and 24 hour management of active medical problems listed below. Physiatrist and rehab team continue to assess barriers to  discharge/monitor patient progress toward functional and medical goals  Care Tool:  Bathing    Body parts bathed by patient: Right arm, Left arm, Chest, Abdomen, Front perineal area, Buttocks, Right upper leg, Left upper leg, Right lower leg, Left lower leg, Face         Bathing assist Assist Level: Contact Guard/Touching assist     Upper Body Dressing/Undressing Upper body dressing   What is the patient wearing?: Pull over shirt    Upper body assist Assist Level: Set up assist    Lower Body Dressing/Undressing Lower body dressing      What is the patient wearing?: Pants, Underwear/pull up     Lower body assist Assist for lower body dressing: Minimal Assistance - Patient > 75%     Toileting Toileting    Toileting assist Assist for toileting: Contact Guard/Touching assist     Transfers Chair/bed transfer  Transfers assist     Chair/bed transfer assist level: Contact Guard/Touching assist     Locomotion Ambulation   Ambulation assist      Assist level: Contact Guard/Touching assist Assistive device: Walker-rolling Max distance: 150   Walk 10 feet activity   Assist     Assist level: Contact Guard/Touching assist Assistive  device: Walker-rolling   Walk 50 feet activity   Assist Walk 50 feet with 2 turns activity did not occur: Safety/medical concerns  Assist level: Contact Guard/Touching assist Assistive device: Walker-rolling    Walk 150 feet activity   Assist Walk 150 feet activity did not occur: Safety/medical concerns  Assist level: Contact Guard/Touching assist Assistive device: Walker-rolling    Walk 10 feet on uneven surface  activity   Assist     Assist level: Contact Guard/Touching assist Assistive device: Walker-rolling   Wheelchair     Assist Is the patient using a wheelchair?: Yes (transported pt to and from main therapy gym for time management) Type of Wheelchair: Manual    Wheelchair assist level:  Dependent - Patient 0% Max wheelchair distance: 131f    Wheelchair 50 feet with 2 turns activity    Assist        Assist Level: Dependent - Patient 0%   Wheelchair 150 feet activity     Assist      Assist Level: Dependent - Patient 0%   Blood pressure (!) 142/75, pulse 66, temperature 98.4 F (36.9 C), temperature source Oral, resp. rate 20, height _0  (1.753 m), weight 69.4 kg, SpO2 100 %.  Medical Problem List and Plan: 1. Functional deficits secondary to acute left posterior cerebral artery infarct             -patient may  shower             -ELOS/Goals: 11/17, supervision goals with PT, OT, SLP  -Continue CIR therapies including PT, OT, and SLP   Team conference today please see physician documentation under team conference tab, met with team  to discuss problems,progress, and goals. Formulized individual treatment plan based on medical history, underlying problem and comorbidities.  2.  Antithrombotics: -DVT/anticoagulation:  Pharmaceutical: Heparin             -antiplatelet therapy: aspirin 81 mg and Plavix daily for 3 months then aspirin only 3. Pain Management: Tylenol, oxycodone as needed             -gabapentin 100 mg q HS  -Reports pain under control, continue to monitor 4. Mood/Behavior/Sleep: LCSW to evaluate and provide emotional support             -antipsychotic agents: n/a 5. Neuropsych/cognition: This patient is capable of making decisions on his own behalf. 6. Skin/Wound Care: Routine skin care checks.              -local care to multiple skin tears above             -encourage appropriate nutrition  -asked nurse if we could find some denture cream for him 7. Fluids/Electrolytes/Nutrition: Routine Is and Os and follow-up chemistries 8: Hypertension: monitor TID and prn (home Lasix, Cozaar, Lopressor on hold)           -resumed lopressor at 239mbid  Vitals:   04/18/22 1911 04/19/22 0509  BP: (!) 143/76 (!) 142/75  Pulse: 64 66  Resp: 18  20  Temp: 98.4 F (36.9 C) 98.4 F (36.9 C)  SpO2: 100% 100%    9: Hyperlipidemia: heart healthy diet (patient declined Crestor PTA) follow-up with PCP and neurology 10: CAD s/p NSTEMI/multiple PCI, DES: on DAPT; follows with Dr. SkMarlou PorchNo cardiac symptoms  11: Prostate cancer s/p radical proctatectomy  -seems to be voiding without issues 12: Severe MR s/p TEER: continue Plavix 13: GERD: monitor  -no c/os 14: CKD stage 3b:  baseline creatinine 1.4-1.6; follow-up BMP      Latest Ref Rng & Units 04/17/2022    5:16 AM 04/14/2022    2:58 PM 04/12/2022    6:21 AM  BMP  Glucose 70 - 99 mg/dL 98  108  100   BUN 8 - 23 mg/dL 30  40  38   Creatinine 0.61 - 1.24 mg/dL 1.52  1.86  1.75   Sodium 135 - 145 mmol/L 140  137  138   Potassium 3.5 - 5.1 mmol/L 4.4  4.4  4.4   Chloride 98 - 111 mmol/L 110  111  109   CO2 22 - 32 mmol/L _0 Calcium 8.9 - 10.3 mg/dL 8.9  8.6  8.7   11/14 BUN/Creat at baseline  Starting to get pretib edema, d/c IVF, resume lasix but at low dose , recheck BMET 11/16  LOS: 12 days A FACE TO Charles E Uzziah Rigg 04/19/2022, 7:26 AM

## 2022-04-19 NOTE — Progress Notes (Signed)
Patient ID: Adam Arellano, male   DOB: 08-21-1931, 88 y.o.   MRN: [id number]  OP referral sent to [facility] per daughter request.

## 2022-04-20 LAB — BASIC METABOLIC PANEL
Anion gap: 6 (ref 5–15)
BUN: 30 mg/dL — ABNORMAL HIGH (ref 8–23)
CO2: 22 mmol/L (ref 22–32)
Calcium: 8.7 mg/dL — ABNORMAL LOW (ref 8.9–10.3)
Chloride: 108 mmol/L (ref 98–111)
Creatinine, Ser: 1.81 mg/dL — ABNORMAL HIGH (ref 0.61–1.24)
GFR, Estimated: 36 mL/min — ABNORMAL LOW (ref >60)
Glucose, Bld: 100 mg/dL — ABNORMAL HIGH (ref 70–99)
Potassium: 4.2 mmol/L (ref 3.5–5.1)
Sodium: 136 mmol/L (ref 135–145)

## 2022-04-20 NOTE — Progress Notes (Signed)
PROGRESS NOTE   Subjective/Complaints:  No issues overnite , discussed renal function test   ROS: Patient denies CP, SOB, N/V/D, abdominal pain, HA,  cough, pain Denies constipation LBM yesterday  Objective:   No results found. No results for input(s): "WBC", "HGB", "HCT", "PLT" in the last 72 hours.  Recent Labs    04/20/22 0638  NA 136  K 4.2  CL 108  CO2 22  GLUCOSE 100*  BUN 30*  CREATININE 1.81*  CALCIUM 8.7*    Intake/Output Summary (Last 24 hours) at 04/20/2022 0759 Last data filed at 04/19/2022 1735 Gross per 24 hour  Intake 660 ml  Output --  Net 660 ml        Physical Exam: Vital Signs Blood pressure (!) 141/75, pulse 65, temperature 97.9 F (36.6 C), temperature source Oral, resp. rate 18, height '5\' 9"'$  (1.753 m), weight 69.4 kg, SpO2 100 %.   General: No acute distress Mood and affect are appropriate, pleasant  Heart: Regular rate and rhythm no rubs murmurs or extra sounds Lungs: Clear to auscultation, breathing unlabored Abdomen: Normoactive bowel sounds, soft, nontender to palpation, nondistended Extremities: No clubbing, cyanosis, 1+ pre tibial edema Skin: Right supra orbital ecchymosis-improving   Neuro:  Alert and oriented x 3. Makes eye contact, Normal insight and awareness. Intact Memory. Normal language and speech.Dense Right HH, . RUE 4/5 (RTC injury). LUE 5/5. BLE grossly 4-/5 prox to 5/5 distally.  Senses pain and LT in all 4's. No abnormal tone noted,  Mild dysmetria R FNF Musculoskeletal:reduced ROM Right shoulder , no joint swelling noted   Assessment/Plan: 1. Functional deficits which require 3+ hours per day of interdisciplinary therapy in a comprehensive inpatient rehab setting. Physiatrist is providing close team supervision and 24 hour management of active medical problems listed below. Physiatrist and rehab team continue to assess barriers to discharge/monitor patient  progress toward functional and medical goals  Care Tool:  Bathing    Body parts bathed by patient: Right arm, Left arm, Chest, Abdomen, Front perineal area, Buttocks, Right upper leg, Left upper leg, Right lower leg, Left lower leg, Face         Bathing assist Assist Level: Contact Guard/Touching assist     Upper Body Dressing/Undressing Upper body dressing   What is the patient wearing?: Pull over shirt    Upper body assist Assist Level: Set up assist    Lower Body Dressing/Undressing Lower body dressing      What is the patient wearing?: Pants, Underwear/pull up     Lower body assist Assist for lower body dressing: Minimal Assistance - Patient > 75%     Toileting Toileting    Toileting assist Assist for toileting: Contact Guard/Touching assist     Transfers Chair/bed transfer  Transfers assist     Chair/bed transfer assist level: Contact Guard/Touching assist     Locomotion Ambulation   Ambulation assist      Assist level: Contact Guard/Touching assist Assistive device: Walker-rolling Max distance: 150   Walk 10 feet activity   Assist     Assist level: Contact Guard/Touching assist Assistive device: Walker-rolling   Walk 50 feet activity   Assist Walk 50 feet  with 2 turns activity did not occur: Safety/medical concerns  Assist level: Contact Guard/Touching assist Assistive device: Walker-rolling    Walk 150 feet activity   Assist Walk 150 feet activity did not occur: Safety/medical concerns  Assist level: Contact Guard/Touching assist Assistive device: Walker-rolling    Walk 10 feet on uneven surface  activity   Assist     Assist level: Contact Guard/Touching assist Assistive device: Walker-rolling   Wheelchair     Assist Is the patient using a wheelchair?: Yes (transported pt to and from main therapy gym for time management) Type of Wheelchair: Manual    Wheelchair assist level: Dependent - Patient 0% Max  wheelchair distance: 132f    Wheelchair 50 feet with 2 turns activity    Assist        Assist Level: Dependent - Patient 0%   Wheelchair 150 feet activity     Assist      Assist Level: Dependent - Patient 0%   Blood pressure (!) 141/75, pulse 65, temperature 97.9 F (36.6 C), temperature source Oral, resp. rate 18, height '5\' 9"'$  (1.753 m), weight 69.4 kg, SpO2 100 %.  Medical Problem List and Plan: 1. Functional deficits secondary to acute left posterior cerebral artery infarct             -patient may  shower             -ELOS/Goals: 11/17, supervision goals with PT, OT, SLP  -Continue CIR therapies including PT, OT, and SLP   T  2.  Antithrombotics: -DVT/anticoagulation:  Pharmaceutical: Heparin             -antiplatelet therapy: aspirin 81 mg and Plavix daily for 3 months then aspirin only 3. Pain Management: Tylenol, oxycodone as needed             -gabapentin 100 mg q HS  -Reports pain under control, continue to monitor 4. Mood/Behavior/Sleep: LCSW to evaluate and provide emotional support             -antipsychotic agents: n/a 5. Neuropsych/cognition: This patient is capable of making decisions on his own behalf. 6. Skin/Wound Care: Routine skin care checks.              -local care to multiple skin tears above             -encourage appropriate nutrition  -asked nurse if we could find some denture cream for him 7. Fluids/Electrolytes/Nutrition: Routine Is and Os and follow-up chemistries 8: Hypertension: monitor TID and prn (home Lasix, Cozaar, Lopressor on hold)           -resumed lopressor at '25mg'$  bid  Vitals:   04/19/22 1957 04/20/22 0441  BP: 133/74 (!) 141/75  Pulse: 65 65  Resp: 18 18  Temp: (!) 97.5 F (36.4 C) 97.9 F (36.6 C)  SpO2: 99% 100%    9: Hyperlipidemia: heart healthy diet (patient declined Crestor PTA) follow-up with PCP and neurology 10: CAD s/p NSTEMI/multiple PCI, DES: on DAPT; follows with Dr. SMarlou Porch No cardiac symptoms   11: Prostate cancer s/p radical proctatectomy  -seems to be voiding without issues 12: Severe MR s/p TEER: continue Plavix 13: GERD: monitor  -no c/os 14: CKD stage 3b: baseline creatinine 1.4-1.6; follow-up BMP      Latest Ref Rng & Units 04/20/2022    6:38 AM 04/17/2022    5:16 AM 04/14/2022    2:58 PM  BMP  Glucose 70 - 99 mg/dL 100  98  108   BUN 8 - 23 mg/dL 30  30  40   Creatinine 0.61 - 1.24 mg/dL 1.81  1.52  1.86   Sodium 135 - 145 mmol/L 136  140  137   Potassium 3.5 - 5.1 mmol/L 4.2  4.4  4.4   Chloride 98 - 111 mmol/L 108  110  111   CO2 22 - 32 mmol/L '22  22  19   '$ Calcium 8.9 - 10.3 mg/dL 8.7  8.9  8.6   11/14 BUN/Creat at baseline   Will d/c lasix given bump in creat, pt will need to f/u with PCP ~1-2wk after d/c LOS: 13 days A FACE TO Adam Arellano 04/20/2022, 7:59 AM

## 2022-04-20 NOTE — Plan of Care (Signed)
  Problem: RH Expression Communication Goal: LTG Patient will increase word finding of common (SLP) Description: LTG:  Patient will increase word finding of common objects/daily info/abstract thoughts with cues using compensatory strategies (SLP). Outcome: Completed/Met   Problem: RH Problem Solving Goal: LTG Patient will demonstrate problem solving for (SLP) Description: LTG:  Patient will demonstrate problem solving for basic/complex daily situations with cues  (SLP) Outcome: Completed/Met   Problem: RH Memory Goal: LTG Patient will use memory compensatory aids to (SLP) Description: LTG:  Patient will use memory compensatory aids to recall biographical/new, daily complex information with cues (SLP) Outcome: Completed/Met   Problem: RH Awareness Goal: LTG: Patient will demonstrate awareness during functional activites type of (SLP) Description: LTG: Patient will demonstrate awareness during functional activites type of (SLP) Outcome: Completed/Met

## 2022-04-20 NOTE — Plan of Care (Signed)
  Problem: RH Balance Goal: LTG: Patient will maintain dynamic sitting balance (OT) Description: LTG:  Patient will maintain dynamic sitting balance with assistance during activities of daily living (OT) Outcome: Completed/Met Goal: LTG Patient will maintain dynamic standing with ADLs (OT) Description: LTG:  Patient will maintain dynamic standing balance with assist during activities of daily living (OT)  Outcome: Completed/Met   Problem: Sit to Stand Goal: LTG:  Patient will perform sit to stand in prep for activites of daily living with assistance level (OT) Description: LTG:  Patient will perform sit to stand in prep for activites of daily living with assistance level (OT) Outcome: Completed/Met   Problem: RH Grooming Goal: LTG Patient will perform grooming w/assist,cues/equip (OT) Description: LTG: Patient will perform grooming with assist, with/without cues using equipment (OT) Outcome: Completed/Met   Problem: RH Bathing Goal: LTG Patient will bathe all body parts with assist levels (OT) Description: LTG: Patient will bathe all body parts with assist levels (OT) Outcome: Completed/Met   Problem: RH Dressing Goal: LTG Patient will perform upper body dressing (OT) Description: LTG Patient will perform upper body dressing with assist, with/without cues (OT). Outcome: Completed/Met Goal: LTG Patient will perform lower body dressing w/assist (OT) Description: LTG: Patient will perform lower body dressing with assist, with/without cues in positioning using equipment (OT) Outcome: Completed/Met   Problem: RH Toileting Goal: LTG Patient will perform toileting task (3/3 steps) with assistance level (OT) Description: LTG: Patient will perform toileting task (3/3 steps) with assistance level (OT)  Outcome: Completed/Met   Problem: RH Functional Use of Upper Extremity Goal: LTG Patient will use RT/LT upper extremity as a (OT) Description: LTG: Patient will use right/left upper  extremity as a stabilizer/gross assist/diminished/nondominant/dominant level with assist, with/without cues during functional activity (OT) Outcome: Completed/Met   Problem: RH Simple Meal Prep Goal: LTG Patient will perform simple meal prep w/assist (OT) Description: LTG: Patient will perform simple meal prep with assistance, with/without cues (OT). Outcome: Completed/Met   Problem: RH Light Housekeeping Goal: LTG Patient will perform light housekeeping w/assist (OT) Description: LTG: Patient will perform light housekeeping with assistance, with/without cues (OT). Outcome: Completed/Met   Problem: RH Toilet Transfers Goal: LTG Patient will perform toilet transfers w/assist (OT) Description: LTG: Patient will perform toilet transfers with assist, with/without cues using equipment (OT) Outcome: Completed/Met   Problem: RH Tub/Shower Transfers Goal: LTG Patient will perform tub/shower transfers w/assist (OT) Description: LTG: Patient will perform tub/shower transfers with assist, with/without cues using equipment (OT) Outcome: Completed/Met   Problem: RH Memory Goal: LTG Patient will demonstrate ability for day to day recall/carry over during activities of daily living with assistance level (OT) Description: LTG:  Patient will demonstrate ability for day to day recall/carry over during activities of daily living with assistance level (OT). Outcome: Completed/Met

## 2022-04-20 NOTE — Progress Notes (Signed)
Physical Therapy Discharge Summary  Patient Details  Name: Adam Arellano MRN: [id number] Date of Birth: February 14, 1932  Date of Discharge from PT service:April 20, 2022  Today's Date: 04/20/2022 PT Individual Time: 0804-0906 PT Individual Time Calculation (min): 62 min    Patient has met 9 of 9 long term goals due to improved activity tolerance, improved balance, improved postural control, increased strength, increased range of motion, decreased pain, functional use of  right upper extremity and right lower extremity, improved attention, and improved coordination.  Patient to discharge at an ambulatory level CGA for ambulation with a RW, stair negotiation, and dynamic standing tasks, SPV for sit to stand and bed to chair transfers, and mod I for dynamic sitting and bed mobility.   .   Patient's care partner is independent to provide the necessary physical and cognitive assistance at discharge.  All goals met.   Recommendation:  Patient will benefit from ongoing skilled PT services in outpatient setting to continue to advance safe functional mobility, address ongoing impairments in standing and walk balance, ambulation, endurance, LE strengthening, and minimize fall risk.  Equipment: No equipment provided, pt has a RW from home.   Reasons for discharge: treatment goals met and discharge from hospital  Patient/family agrees with progress made and goals achieved: Yes  Skilled Therapeutic Interventions/Progress Updates:  Pt greeted supine in bed and agreeable to therapy. No c/o pain.   Sup>sit with mod I and able to doff shirt and don clean shirt with set up assist. Donned shoes with total assist for time management.   Sit>stand EOB to RW with SPV, amb ~66f to bathroom with CGA and cues to remain within RW, in standing pt is able to manage LB dressing and is continent of bladder in standing with CGA. Amb ~128fto sink with RW and CGA with continued cues on managing RW. Pt able to  correct RW management immediately, but has difficulty with remembering to keep within RW when turning or with distractions.   Sit<>stand and stand pivot transfers with RW and SPV throughout session. Frequent cues for hand placement.   Gait training:  [id number] and RW.  Occasional cues to keep within RW, especially with turning and with distractions in hall way. One cue needed to increase attention to R LE to keep from kicking RW while ambulating.   Ramp:  RW and CGA, cues with descending ramp to keep within RW with good pt carryover.   Steps:  2 steps, per home set up with RW and CGA. SPT demonstrated technique and sequencing first and then pt performed twice with minimal cuing needed. Good RW management.   HEP:  Made, demonstrated, and practiced HEP with patient. All standing exercises performed with CGA and B UE support on counter.   -Standing march x 10- pt showing decreased coordination of movement evident by wide marching step width even after cuing  -standing hip abd and ext x 10 each- cues for form and upright posture  -standing side stepping x10 bilateral- difficulty with R side stepping due to ataxia in R LE.  -sit to stand x10 with cues for correct hand placement on chair prior to standing and reaching back for chair prior to sitting  -supine bridging x 20, cues for technique. Sit<>supine mod I.  Full HEP listed below   Transported pt back to room via wheelchair for time management.   Pt left seated in WC with posey belt on, brakes locked, call bell in reach, all needs  met.    Access Code: [email or web address]  URL: [email or web address]  Date: 04/20/2022  Prepared by: Page Spiro   Exercises  - Sit to Stand with Counter Support  - 1 x daily - 7 x weekly - 2 sets - 10 reps  - Standing March with Counter Support  - 1 x daily - 7 x weekly - 2 sets - 10 reps  - Standing Hip Extension with Counter Support  - 1 x daily - 7 x weekly - 2 sets - 10 reps  -  Standing Hip Abduction with Counter Support  - 1 x daily - 7 x weekly - 2 sets - 10 reps  - Side Stepping with Counter Support  - 1 x daily - 7 x weekly - 2 sets - 10 reps  - Supine Bridge with Resistance Band  - 1 x daily - 7 x weekly - 2 sets - 15 reps    PT Discharge Precautions/Restrictions Precautions Precautions: Fall Precaution Comments: R hemiparesis, R inattention, fragile skin. Restrictions Weight Bearing Restrictions: No Pain Pain Assessment Pain Scale: 0-10 Pain Score: 0-No pain Pain Interference Pain Interference Pain Effect on Sleep: 1. Rarely or not at all Pain Interference with Therapy Activities: 1. Rarely or not at all Pain Interference with Day-to-Day Activities: 1. Rarely or not at all Vision/Perception  Vision - History Ability to See in Adequate Light: 0 Adequate Vision - Assessment Eye Alignment: Within Functional Limits Perception Perception: Within Functional Limits Praxis Praxis: Intact  Cognition Overall Cognitive Status: Impaired/Different from baseline Arousal/Alertness: Awake/alert Orientation Level: Oriented X4 Year: 2023 Month: November Day of Week: Correct Sustained Attention: Appears intact Memory: Impaired Memory Impairment: Decreased short term memory Awareness: Impaired Problem Solving: Appears intact Behaviors: Impulsive Safety/Judgment: Impaired (due to impulsiveness occassionally with ambulating and transfers.) Sensation Sensation Light Touch: Impaired by gross assessment Hot/Cold: Appears Intact Proprioception: Impaired by gross assessment Stereognosis: Not tested Additional Comments: Pt reporting decreased sensation to the R lower leg and foot compared to the left.  Coordination Gross Motor Movements are Fluid and Coordinated: No Fine Motor Movements are Fluid and Coordinated: No Coordination and Movement Description: overshooting during functional reaching task with RUE Finger Nose Finger Test: Improvment since  evaluation, mild overshooting on R side Heel Shin Test: RUE sporadically has ataxic movements when reaching Motor  Motor Motor: Ataxia;Other (comment);Abnormal postural alignment and control Motor - Discharge Observations: Decreased smoothness of movement with R LE with ambulation, side stepping, and stair negotiation.  Mobility Bed Mobility Bed Mobility: Sit to Supine;Supine to Sit Supine to Sit: Independent with assistive device Sit to Supine: Independent with assistive device Transfers Transfers: Sit to Stand;Stand to Sit;Stand Pivot Transfers Sit to Stand: Supervision/Verbal cueing Stand to Sit: Supervision/Verbal cueing Stand Pivot Transfers: Supervision/Verbal cueing Stand Pivot Transfer Details: Verbal cues for sequencing;Verbal cues for technique Transfer (Assistive device): Rolling walker Locomotion  Gait Ambulation: Yes Gait Assistance: Contact Guard/Touching assist Gait Distance (Feet): 500 Feet Assistive device: Rolling walker Gait Assistance Details: Verbal cues for precautions/safety;Visual cues/gestures for precautions/safety;Tactile cues for posture;Verbal cues for gait pattern Gait Gait: Yes Gait Pattern: Impaired Gait Pattern: Decreased step length - right;Decreased step length - left;Decreased hip/knee flexion - right;Decreased hip/knee flexion - left;Decreased stride length;Ataxic;Narrow base of support Gait velocity: decreased, but improved from baseline Stairs / Additional Locomotion Stairs: Yes Stairs Assistance: Contact Guard/Touching assist Stair Management Technique: With walker;Forwards Number of Stairs: 2 Height of Stairs: -2 Ramp: Contact Guard/touching assist Curb: Contact Guard/Touching assist Wheelchair Mobility Wheelchair Mobility: No  Trunk/Postural Assessment  Cervical Assessment Cervical Assessment: Exceptions to [facility] (forward head) Thoracic Assessment Thoracic Assessment: Exceptions to [facility] (rounded shoulders) Lumbar Assessment Lumbar  Assessment: Exceptions to [facility] (posterior pelvic tilt) Postural Control Postural Control: Deficits on evaluation (minimal posterior lean in standing, improved from eval.)  Balance Balance Balance Assessed: Yes Standardized Balance Assessment Standardized Balance Assessment: Berg Balance Test Berg Balance Test Sit to Stand: Able to stand  independently using hands Standing Unsupported: Able to stand 2 minutes with supervision Sitting with Back Unsupported but Feet Supported on Floor or Stool: Able to sit safely and securely 2 minutes Stand to Sit: Controls descent by using hands Transfers: Able to transfer with verbal cueing and /or supervision Standing Unsupported with Eyes Closed: Able to stand 10 seconds with supervision Standing Ubsupported with Feet Together: Able to place feet together independently but unable to hold for 30 seconds From Standing, Reach Forward with Outstretched Arm: Reaches forward but needs supervision From Standing Position, Pick up Object from Floor: Able to pick up shoe, needs supervision From Standing Position, Turn to Look Behind Over each Shoulder: Turn sideways only but maintains balance Turn 360 Degrees: Needs close supervision or verbal cueing Standing Unsupported, Alternately Place Feet on Step/Stool: Able to complete >2 steps/needs minimal assist Standing Unsupported, One Foot in Front: Loses balance while stepping or standing Standing on One Leg: Unable to try or needs assist to prevent fall Total Score: 28 Static Sitting Balance Static Sitting - Balance Support: No upper extremity supported;Feet supported Static Sitting - Level of Assistance: 6: Modified independent (Device/Increase time) Dynamic Sitting Balance Dynamic Sitting - Balance Support: No upper extremity supported;Feet supported;During functional activity Dynamic Sitting - Level of Assistance: 6: Modified independent (Device/Increase time) Static Standing Balance Static Standing - Balance  Support: Bilateral upper extremity supported;During functional activity Static Standing - Level of Assistance: 5: Stand by assistance (CGA) Dynamic Standing Balance Dynamic Standing - Balance Support: Bilateral upper extremity supported;During functional activity Dynamic Standing - Level of Assistance: 5: Stand by assistance (CGA) Extremity Assessment      RLE Assessment RLE Assessment: Exceptions to [facility] General Strength Comments: grossly assessed in sitting RLE Strength Right Hip Flexion: 4/5 Right Knee Flexion: 4/5 Right Knee Extension: 4+/5 Right Ankle Dorsiflexion: 4+/5 Right Ankle Plantar Flexion: 4+/5 LLE Assessment LLE Assessment: Within Functional Limits General Strength Comments: grossly assessed in sitting   Kayleen Memos, SPT 04/20/2022, 7:55 AM

## 2022-04-20 NOTE — Progress Notes (Signed)
Occupational Therapy Discharge Summary  Patient Details  Name: Adam Arellano MRN: [id number] Date of Birth: 24-Sep-1931  Date of Discharge from OT service:November 45, 2023  Today's Date: 04/20/2022 OT Individual Time: 1415-1500 OT Individual Time Calculation (min): 45 min    Patient has met 14 of 14 long term goals due to improved activity tolerance, improved balance, postural control, ability to compensate for deficits, functional use of  RIGHT upper and RIGHT lower extremity, improved attention, improved awareness, and improved coordination.  Patient to discharge at overall Supervision level.  Patient's care partner is independent to provide the necessary physical and cognitive assistance at discharge.    Reasons goals not met: All goals met   Recommendation:  Patient will benefit from ongoing skilled OT services in outpatient setting to continue to advance functional skills in the area of BADL, iADL, and Reduce care partner burden.  Equipment: BSC, Tub transfer bench  Reasons for discharge: treatment goals met and discharge from hospital  Patient/family agrees with progress made and goals achieved: Yes  Skilled Therapeutic Interventions and Updates: Pt received sleeping in wc easy to wake and motivated to participate in skilled OT session. Pt reporting 0/10 pain during session. Pt completed toileting, U/LB dressing, grooming, and functional mobility in room using RW with supervision. Pt requires min verbal cues for safety during ambulation for pacing, to stay within walker, and for hand placement on RW. Pt demonstrates impulsive movements during BADLs and benefits from mod verbal cues to increase safety. Pt will be dc'ing home with DTR and sone in law with 24/7 supervision. Pt was left resting in wc with son in law present, call bell in reach, seat belt alarm on, and all needs met.   OT Discharge Precautions/Restrictions  Precautions Precautions: Fall Precaution Comments: R  hemiparesis, R inattention, fragile skin. Restrictions Weight Bearing Restrictions: No General   Vital Signs Therapy Vitals Temp: 97.9 F (36.6 C) Temp Source: Oral Pulse Rate: 71 Resp: 18 BP: (Abnormal) 134/58 Patient Position (if appropriate): Sitting Oxygen Therapy SpO2: 100 % Pain Pain Assessment Pain Scale: 0-10 Pain Score: 0-No pain ADL ADL Eating: Set up Where Assessed-Eating: Chair Grooming: Modified independent Where Assessed-Grooming: Standing at sink Upper Body Bathing: Supervision/safety Where Assessed-Upper Body Bathing: Shower Lower Body Bathing: Supervision/safety Where Assessed-Lower Body Bathing: Shower Upper Body Dressing: Supervision/safety Where Assessed-Upper Body Dressing: Wheelchair Lower Body Dressing: Supervision/safety Where Assessed-Lower Body Dressing: Wheelchair Toileting: Supervision/safety Where Assessed-Toileting: Glass blower/designer: Close supervision Toilet Transfer Method: Counselling psychologist: Energy manager: Close supervison Clinical cytogeneticist Method: Optometrist: [employer], Facilities manager: Close supervision Social research officer, government Method: Heritage manager: Civil engineer, contracting with back Vision Baseline Vision/History: 1 Wears glasses Patient Visual Report: No change from baseline Vision Assessment?: No apparent visual deficits Eye Alignment: Within Functional Limits Ocular Range of Motion: Restricted looking down Tracking/Visual Pursuits: Requires cues, head turns, or add eye shifts to track Saccades: Within functional limits Convergence: Within functional limits Visual Fields: Right visual field deficit Depth Perception: Overshoots Perception  Perception: Within Functional Limits Inattention/Neglect: Does not attend to right visual field;Does not attend to right side of body (Pt has shown significant increased independence in  attending to R visual field) Praxis Praxis: Intact Cognition Cognition Overall Cognitive Status: Within Functional Limits for tasks assessed Arousal/Alertness: Awake/alert Orientation Level: Person;Place;Situation Person: Oriented Place: Oriented Situation: Oriented Memory: Impaired Memory Impairment: Decreased short term memory Decreased Short Term Memory: Verbal basic Attention: Sustained Focused Attention: Appears  intact Sustained Attention: Appears intact Awareness: Impaired Awareness Impairment: Emergent impairment Problem Solving: Appears intact Problem Solving Impairment: Functional basic;Verbal basic Executive Function: Reasoning;Organizing Reasoning: Impaired Reasoning Impairment: Verbal basic Organizing: Impaired Organizing Impairment: Verbal basic Behaviors: Impulsive Safety/Judgment: Impaired Comments: right inattention Brief Interview for Mental Status (BIMS) Repetition of Three Words (First Attempt): 3 Temporal Orientation: Year: Missed by more than 5 years Temporal Orientation: Month: Accurate within 5 days Temporal Orientation: Day: Correct Recall: "Sock": No, could not recall Recall: "Blue": No, could not recall Recall: "Bed": No, could not recall BIMS Summary Score: 6 Sensation Sensation Light Touch: Impaired by gross assessment Hot/Cold: Appears Intact Proprioception: Impaired by gross assessment Stereognosis: Not tested Additional Comments: Pt reporting decreased sensation to the R lower leg and foot compared to the right. Coordination Gross Motor Movements are Fluid and Coordinated: No Fine Motor Movements are Fluid and Coordinated: No Coordination and Movement Description: overshooting during functional reaching task with RUE Finger Nose Finger Test: Improvment since evaluation, mild overshooting on R side Heel Shin Test: RUE sporadically has ataxic movements when reaching Motor  Motor Motor: Ataxia;Other (comment);Abnormal postural alignment  and control Motor - Discharge Observations: Decreased smoothess of movements RUE Mobility  Bed Mobility Bed Mobility: Sit to Supine;Supine to Sit Supine to Sit: Independent with assistive device Sit to Supine: Independent with assistive device Transfers Sit to Stand: Supervision/Verbal cueing Stand to Sit: Supervision/Verbal cueing  Trunk/Postural Assessment  Cervical Assessment Cervical Assessment: Exceptions to [facility] (forward head) Thoracic Assessment Thoracic Assessment: Exceptions to [facility] (rounded shoulders) Lumbar Assessment Lumbar Assessment: Exceptions to [facility] (posterior pelvic tilt) Postural Control Postural Control: Deficits on evaluation (minimal posterior lean when completing LB ADLS and in standing; increased improvement since evaluation)  Balance Balance Balance Assessed: Yes Standardized Balance Assessment Standardized Balance Assessment: Berg Balance Test Berg Balance Test Sit to Stand: Able to stand  independently using hands Standing Unsupported: Able to stand 2 minutes with supervision Sitting with Back Unsupported but Feet Supported on Floor or Stool: Able to sit safely and securely 2 minutes Stand to Sit: Controls descent by using hands Transfers: Able to transfer with verbal cueing and /or supervision Standing Unsupported with Eyes Closed: Able to stand 10 seconds with supervision Standing Ubsupported with Feet Together: Able to place feet together independently but unable to hold for 30 seconds From Standing, Reach Forward with Outstretched Arm: Reaches forward but needs supervision From Standing Position, Pick up Object from Floor: Able to pick up shoe, needs supervision From Standing Position, Turn to Look Behind Over each Shoulder: Turn sideways only but maintains balance Turn 360 Degrees: Needs close supervision or verbal cueing Standing Unsupported, Alternately Place Feet on Step/Stool: Able to complete >2 steps/needs minimal assist Standing Unsupported,  One Foot in Front: Loses balance while stepping or standing Standing on One Leg: Unable to try or needs assist to prevent fall Total Score: 28 Static Sitting Balance Static Sitting - Balance Support: No upper extremity supported;Feet supported Static Sitting - Level of Assistance: 6: Modified independent (Device/Increase time) Dynamic Sitting Balance Dynamic Sitting - Balance Support: No upper extremity supported;Feet supported;During functional activity Dynamic Sitting - Level of Assistance: 6: Modified independent (Device/Increase time) Dynamic Sitting - Balance Activities: Reaching for objects Static Standing Balance Static Standing - Balance Support: Bilateral upper extremity supported;During functional activity Static Standing - Level of Assistance: 6: Modified independent (Device/Increase time) Dynamic Standing Balance Dynamic Standing - Balance Support: Bilateral upper extremity supported;During functional activity Dynamic Standing - Level of Assistance: 6: Modified independent (Device/Increase time)  Dynamic Standing - Balance Activities: Reaching for objects Extremity/Trunk Assessment RUE Assessment RUE Assessment: Exceptions to [facility] Active Range of Motion (AROM) Comments: requires assist from the L UE to achieve full active ROM RUE Body System: Neuro LUE Assessment LUE Assessment: Within Functional Limits   Janey Genta 04/20/2022, 3:35 PM

## 2022-04-20 NOTE — Progress Notes (Signed)
Inpatient Rehabilitation Care Coordinator Discharge Note   Patient Details  Name: Adam Arellano MRN: [id number] Date of Birth: 12-May-1932   Discharge location: Daughter's Home  Length of Stay: 14 Days  Discharge activity level: Supervision  Home/community participation: Daughter and SIL  Patient response [id number] Literacy - How often do you need to have someone help you when you read instructions, pamphlets, or other written material from your doctor or pharmacy?: Sometimes  Patient response FX:[id number] Isolation - How often do you feel lonely or isolated from those around you?: Never  Services provided included: SW, Neuropsych, Pharmacy, TR, CM, RN, SLP, OT, PT, RD, MD  Financial Services:  Financial Services Utilized: [employer]  Choices offered to/list presented to: patient daughter  Follow-up services arranged:  Outpatient Home Health Agency: N/A  Outpatient Servicies: PT OT SLP [facility]      Patient response to transportation need: Is the patient able to respond to transportation needs?: Yes In the past 12 months, has lack of transportation kept you from medical appointments or from getting medications?: No In the past 12 months, has lack of transportation kept you from meetings, work, or from getting things needed for daily living?: No    Comments (or additional information):  Patient/Family verbalized understanding of follow-up arrangements:  Yes  Individual responsible for coordination of the follow-up plan: [name], daughter  Confirmed correct DME delivered: Dyanne Iha 04/20/2022    Dyanne Iha

## 2022-04-20 NOTE — Progress Notes (Signed)
Physical Therapy Session Note  Patient Details  Name: Adam Arellano MRN: [id number] Date of Birth: 06-11-1931  Today's Date: 04/20/2022 PT Individual Time: 1121-1202 PT Individual Time Calculation (min): 41 min   Short Term Goals: Week 1:  PT Short Term Goal 1 (Week 1): Pt will perform bed mobility with mod i consistently. PT Short Term Goal 1 - Progress (Week 1): Met PT Short Term Goal 2 (Week 1): Pt will perform sit<>stand transfers with CGA and LRAD consistently. PT Short Term Goal 2 - Progress (Week 1): Met PT Short Term Goal 3 (Week 1): Pt will perform bed<>chair transfers with CGA and LRAD consistently. PT Short Term Goal 3 - Progress (Week 1): Met PT Short Term Goal 4 (Week 1): Pt will ambulate 74f with min assist and LRAD consistently. PT Short Term Goal 4 - Progress (Week 1): Met PT Short Term Goal 5 (Week 1): Pt will perform stair negotiation, 1 step, with min assist consistenly. PT Short Term Goal 5 - Progress (Week 1): Met Week 2:  PT Short Term Goal 1 (Week 2): = to LTGs due to ELOS.   Skilled Therapeutic Interventions/Progress Updates:  Patient seated upright in w/c on entrance to room. Dtr present. Patient alert and agreeable to PT session.   Patient with no pain complaint at start of session.  Therapeutic Activity: Transfers: Pt performed sit<>stand and stand pivot transfers using RW throughout session with supervision especially for impulsivity and RW mgmt. Provided verbal cues for BUE push to stand.  Gait Training:  Pt ambulated >150 ft x3 using RW with supervision and w/c follow for fatigue. Demonstrated intermittent forward placement of RW requiring vc/ tc for improved proximity to walker.   Neuromuscular Re-ed: NMR facilitated during session with focus on standing balance, RW mgmt, safety awareness. Pt guided in lateral stepping with HHA for UE support. Completes 20 ft in each direction with vc for LE positioning. Then guided in balance training using [employer]  balance beam in // bars with fwd and lateral stepping. With UE support, pt performs with no instability or LOB and CGA.   Pt guided in ambulatory retrieval of weighted balls from about day room. All ball placements require planning for safe approach and retrieval. Pt does demo few instances of discard of walker to side as well as stepping outside of RW base. Cues for problem solving safe approach at 50% of targets. Pt retrieves with BUE and hands off to PT with RUE. NMR performed for improvements in motor control and coordination, balance, sequencing, judgement, and self confidence/ efficacy in performing all aspects of mobility at highest level of independence.   Patient seated in w/c at end of session with brakes locked, no alarm set as dtr in room, and all needs within reach.   Therapy Documentation Precautions:  Precautions Precautions: Fall Precaution Comments: R hemiparesis, R inattention, fragile skin. Restrictions Weight Bearing Restrictions: No General:   Vital Signs: Therapy Vitals Temp: 97.9 F (36.6 C) Temp Source: Oral Pulse Rate: 71 Resp: 18 BP: (!) 134/58 Patient Position (if appropriate): Sitting Oxygen Therapy SpO2: 100 % Pain: Pain Assessment Pain Scale: 0-10 Pain Score: 0-No pain Mobility: Bed Mobility Bed Mobility: Sit to Supine;Supine to Sit Supine to Sit: Independent with assistive device Sit to Supine: Independent with assistive device Transfers Transfers: Sit to Stand;Stand to Sit;Stand Pivot Transfers Sit to Stand: Supervision/Verbal cueing Stand to Sit: Supervision/Verbal cueing Stand Pivot Transfers: Supervision/Verbal cueing Stand Pivot Transfer Details: Verbal cues for sequencing;Verbal cues for technique Transfer (  Assistive device): Rolling walker Locomotion : Gait Ambulation: Yes Gait Assistance: Contact Guard/Touching assist  Trunk/Postural Assessment :    Balance:   Exercises:   Other Treatments:      Therapy/Group: Individual  Therapy  Adam Arellano PT, DPT, CSRS 04/20/2022, 1:39 PM

## 2022-04-21 MED ORDER — MUSCLE RUB 10-15 % EX CREA: 1.0000 | TOPICAL_CREAM | Freq: Two times a day (BID) | CUTANEOUS | 0 refills | Status: DC | PRN

## 2022-04-21 MED ORDER — POLYETHYLENE GLYCOL 3350 17 G PO PACK: 17.0000 g | PACK | Freq: Every day | ORAL | 0 refills | Status: AC | PRN

## 2022-04-21 MED ORDER — GABAPENTIN 100 MG PO CAPS: 100.0000 mg | ORAL_CAPSULE | Freq: Every day | ORAL | 0 refills | Status: AC

## 2022-04-21 MED ORDER — CLOPIDOGREL BISULFATE 75 MG PO TABS: 75.0000 mg | ORAL_TABLET | Freq: Every day | ORAL | 0 refills | Status: DC

## 2022-04-21 MED ORDER — ACETAMINOPHEN 325 MG PO TABS: 325.0000 mg | ORAL_TABLET | ORAL | Status: AC | PRN

## 2022-04-21 MED ORDER — DOCUSATE SODIUM 100 MG PO CAPS: 100.0000 mg | ORAL_CAPSULE | Freq: Two times a day (BID) | ORAL | 0 refills | Status: AC

## 2022-04-21 NOTE — Progress Notes (Signed)
PROGRESS NOTE   Subjective/Complaints:  Bowels "bound up" discussed increasing fluid intake as well as fruit and veggie intake, cont coace at home D/w nsg will give dulc supp this am   ROS: Patient denies CP, SOB, N/V/D, abdominal pain, HA,  cough, pain Denies constipation LBM yesterday  Objective:   No results found. No results for input(s): "WBC", "HGB", "HCT", "PLT" in the last 72 hours.  Recent Labs    04/20/22 0638  NA 136  K 4.2  CL 108  CO2 22  GLUCOSE 100*  BUN 30*  CREATININE 1.81*  CALCIUM 8.7*     Intake/Output Summary (Last 24 hours) at 04/21/2022 0744 Last data filed at 04/21/2022 0700 Gross per 24 hour  Intake 118 ml  Output --  Net 118 ml         Physical Exam: Vital Signs Blood pressure (!) 155/70, pulse (!) 57, temperature (!) 97.5 F (36.4 C), resp. rate 18, height '5\' 9"'$  (1.753 m), weight 69.4 kg, SpO2 100 %.   General: No acute distress Mood and affect are appropriate, pleasant  Heart: Regular rate and rhythm no rubs murmurs or extra sounds Lungs: Clear to auscultation, breathing unlabored Abdomen: Normoactive bowel sounds, soft, nontender to palpation, nondistended Extremities: No clubbing, cyanosis, 1+ pre tibial edema Skin: Right supra orbital ecchymosis-improving   Neuro:  Alert and oriented x 3. Makes eye contact, Normal insight and awareness. Intact Memory. Normal language and speech.Dense Right HH, . RUE 4/5 (RTC injury). LUE 5/5. BLE grossly 4-/5 prox to 5/5 distally.  Senses pain and LT in all 4's. No abnormal tone noted,  Mild dysmetria R FNF RIght field cut  Musculoskeletal:reduced ROM Right shoulder , no joint swelling noted   Assessment/Plan: 1. Functional deficits Left PCA infarct  Stable for D/C today F/u PCP in 3-4 weeks F/u PM&R 2 weeks See D/C summary See D/C instructions   Care Tool:  Bathing    Body parts bathed by patient: Right arm, Left arm,  Chest, Abdomen, Front perineal area, Buttocks, Right upper leg, Left upper leg, Right lower leg, Left lower leg, Face         Bathing assist Assist Level: Supervision/Verbal cueing     Upper Body Dressing/Undressing Upper body dressing   What is the patient wearing?: Pull over shirt    Upper body assist Assist Level: Independent    Lower Body Dressing/Undressing Lower body dressing      What is the patient wearing?: Pants, Underwear/pull up     Lower body assist Assist for lower body dressing: Supervision/Verbal cueing     Toileting Toileting    Toileting assist Assist for toileting: Supervision/Verbal cueing     Transfers Chair/bed transfer  Transfers assist     Chair/bed transfer assist level: Supervision/Verbal cueing     Locomotion Ambulation   Ambulation assist      Assist level: Contact Guard/Touching assist Assistive device: Walker-rolling Max distance: 571f   Walk 10 feet activity   Assist     Assist level: Supervision/Verbal cueing Assistive device: Walker-rolling   Walk 50 feet activity   Assist Walk 50 feet with 2 turns activity did not occur: Safety/medical concerns  Assist  level: Contact Guard/Touching assist Assistive device: Walker-rolling    Walk 150 feet activity   Assist Walk 150 feet activity did not occur: Safety/medical concerns  Assist level: Contact Guard/Touching assist Assistive device: Walker-rolling    Walk 10 feet on uneven surface  activity   Assist     Assist level: Contact Guard/Touching assist Assistive device: Walker-rolling   Wheelchair     Assist Is the patient using a wheelchair?: No Type of Wheelchair: Manual    Wheelchair assist level: Dependent - Patient 0% Max wheelchair distance: 185f    Wheelchair 50 feet with 2 turns activity    Assist        Assist Level: Dependent - Patient 0%   Wheelchair 150 feet activity     Assist      Assist Level: Dependent -  Patient 0%   Blood pressure (!) 155/70, pulse (!) 57, temperature (!) 97.5 F (36.4 C), resp. rate 18, height '5\' 9"'$  (1.753 m), weight 69.4 kg, SpO2 100 %.  Medical Problem List and Plan: 1. Functional deficits secondary to acute left posterior cerebral artery infarct             -patient may  shower             -ELOS/Goals: 11/17, d/c home today , no driving   -Continue CIR therapies including PT, OT, and SLP   T  2.  Antithrombotics: -DVT/anticoagulation:  Pharmaceutical: Heparin             -antiplatelet therapy: aspirin 81 mg and Plavix daily for 3 months then aspirin only 3. Pain Management: Tylenol, oxycodone as needed             -gabapentin 100 mg q HS  -Reports pain under control, continue to monitor 4. Mood/Behavior/Sleep: LCSW to evaluate and provide emotional support             -antipsychotic agents: n/a 5. Neuropsych/cognition: This patient is capable of making decisions on his own behalf. 6. Skin/Wound Care: Routine skin care checks.              -local care to multiple skin tears above             -encourage appropriate nutrition  -asked nurse if we could find some denture cream for him 7. Fluids/Electrolytes/Nutrition: Routine Is and Os and follow-up chemistries 8: Hypertension: monitor TID and prn (home Lasix, Cozaar, Lopressor on hold)           -resumed lopressor at '25mg'$  bid  Vitals:   04/20/22 1937 04/21/22 0530  BP: (!) 147/79 (!) 155/70  Pulse: 68 (!) 57  Resp: 15 18  Temp: 98 F (36.7 C) (!) 97.5 F (36.4 C)  SpO2: 97% 100%    9: Hyperlipidemia: heart healthy diet (patient declined Crestor PTA) follow-up with PCP and neurology 10: CAD s/p NSTEMI/multiple PCI, DES: on DAPT; follows with Dr. SMarlou Porch No cardiac symptoms  11: Prostate cancer s/p radical proctatectomy  -seems to be voiding without issues 12: Severe MR s/p TEER: continue Plavix 13: GERD: monitor  -no c/os 14: CKD stage 3b: baseline creatinine 1.4-1.6; follow-up BMP      Latest Ref  Rng & Units 04/20/2022    6:38 AM 04/17/2022    5:16 AM 04/14/2022    2:58 PM  BMP  Glucose 70 - 99 mg/dL 100  98  108   BUN 8 - 23 mg/dL 30  30  40   Creatinine 0.61 - 1.24 mg/dL  1.81  1.52  1.86   Sodium 135 - 145 mmol/L 136  140  137   Potassium 3.5 - 5.1 mmol/L 4.2  4.4  4.4   Chloride 98 - 111 mmol/L 108  110  111   CO2 22 - 32 mmol/L '22  22  19   '$ Calcium 8.9 - 10.3 mg/dL 8.7  8.9  8.6   Has been above baseline range, hydration main issue , f/u PCP and Nephro as OP   Will d/c lasix given bump in creat, pt will need to f/u with PCP ~1-2wk after d/c  15.  Constipation - enc fluids, fruit and veggie intake, , colace '100mg'$  BID, will need dulc supp this am  LOS: 14 days A FACE TO FACE EVALUATION WAS PERFORMED  Charlett Blake 04/21/2022, 7:44 AM

## 2022-04-24 NOTE — Progress Notes (Signed)
Inpatient Rehabilitation Discharge Medication Review by a Pharmacist  A complete drug regimen review was completed for this patient to identify any potential clinically significant medication issues.  High Risk Drug Classes Is patient taking? Indication by Medication  Antipsychotic No   Anticoagulant No   Antibiotic Yes Ciprofloxacin/dexamethasone-ear infection   Opioid No   Antiplatelet Yes Aspirin/Plavix-CVA/CAD  Hypoglycemics/insulin No   Vasoactive Medication Yes Losartan, metoprolol-HTN  Chemotherapy No   Other Yes Gabapentin-pain Miralax-constipation Docusate-constipation     Type of Medication Issue Identified Description of Issue Recommendation(s)  Drug Interaction(s) (clinically significant)     Duplicate Therapy     Allergy     No Medication Administration End Date     Incorrect Dose     Additional Drug Therapy Needed     Significant med changes from prior encounter (inform family/care partners about these prior to discharge).    Other       Clinically significant medication issues were identified that warrant physician communication and completion of prescribed/recommended actions by midnight of the next day:  No  Name of provider notified for urgent issues identified:   Provider Method of Notification:     Pharmacist comments:   Time spent performing this drug regimen review (minutes):  10   Bailee Metter A. Levada Dy, PharmD, BCPS, FNKF Clinical Pharmacist [place] Please utilize Amion for appropriate phone number to reach the unit pharmacist ([place])  04/24/2022 10:22 AM

## 2022-04-26 DIAGNOSIS — H53461 Homonymous bilateral field defects, right side: Secondary | ICD-10-CM | POA: Diagnosis not present

## 2022-04-26 DIAGNOSIS — I6930 Unspecified sequelae of cerebral infarction: Secondary | ICD-10-CM | POA: Diagnosis not present

## 2022-04-26 DIAGNOSIS — I129 Hypertensive chronic kidney disease with stage 1 through stage 4 chronic kidney disease, or unspecified chronic kidney disease: Secondary | ICD-10-CM | POA: Diagnosis not present

## 2022-04-26 DIAGNOSIS — R4701 Aphasia: Secondary | ICD-10-CM | POA: Diagnosis not present

## 2022-04-26 DIAGNOSIS — Z9989 Dependence on other enabling machines and devices: Secondary | ICD-10-CM | POA: Diagnosis not present

## 2022-04-26 DIAGNOSIS — N1832 Chronic kidney disease, stage 3b: Secondary | ICD-10-CM | POA: Diagnosis not present

## 2022-04-29 DIAGNOSIS — I639 Cerebral infarction, unspecified: Secondary | ICD-10-CM | POA: Diagnosis not present

## 2022-04-29 DIAGNOSIS — G464 Cerebellar stroke syndrome: Secondary | ICD-10-CM | POA: Diagnosis not present

## 2022-05-03 ENCOUNTER — Encounter: Payer: Medicare Other | Admitting: Registered Nurse

## 2022-05-03 ENCOUNTER — Ambulatory Visit: Payer: Medicare Other | Attending: Cardiology

## 2022-05-03 DIAGNOSIS — I639 Cerebral infarction, unspecified: Secondary | ICD-10-CM

## 2022-05-04 ENCOUNTER — Encounter (INDEPENDENT_AMBULATORY_CARE_PROVIDER_SITE_OTHER): Payer: Medicare Other | Admitting: Ophthalmology

## 2022-05-04 DIAGNOSIS — H353133 Nonexudative age-related macular degeneration, bilateral, advanced atrophic without subfoveal involvement: Secondary | ICD-10-CM | POA: Diagnosis not present

## 2022-05-04 DIAGNOSIS — H353221 Exudative age-related macular degeneration, left eye, with active choroidal neovascularization: Secondary | ICD-10-CM | POA: Diagnosis not present

## 2022-05-08 NOTE — Therapy (Signed)
OUTPATIENT OCCUPATIONAL THERAPY NEURO EVALUATION  Patient Name: Adam Arellano MRN: [id number] DOB:1931/06/16, 88 y.o., male Today's Date: 05/09/2022  PCP: Merlyn Lot, MD REFERRING PROVIDER: Cathlyn Parsons, PA-C  END OF SESSION:  OT End of Session - 05/09/22 1451     Visit Number 1    Number of Visits 16    Date for OT Re-Evaluation 07/10/22    Authorization Type UHC MCR    OT Start Time 0930    OT Stop Time 1015    OT Time Calculation (min) 45 min    Activity Tolerance Patient tolerated treatment well    Behavior During Therapy WFL for tasks assessed/performed             Past Medical History:  Diagnosis Date   Asthma    a. Childhood.   Coronary artery disease    a. NSTEMI s/p DES to dRCA 2007. b. Angina 07/2009: s/p DESx2 to mid RCA for ISR and prox RCA, EF 65%. c. Stress test 05/2013: low risk, no evidence of ischemia, EF 61%.   Dyslipidemia    GERD (gastroesophageal reflux disease)    History of kidney stones    Hypertension    Peripheral vascular disease (HCC)    Prostate cancer ([place])    a. s/p radical prostatectomy.   PVC's (premature ventricular contractions)    S/P mitral valve clip implantation 08/19/2020   s/p MitraClip implantation with a XTW by Dr. Burt Knack    Transient global amnesia    a. Adm 2009: imaging nonacute, had resolved.   Past Surgical History:  Procedure Laterality Date   CARDIAC CATHETERIZATION     COLONOSCOPY     CORONARY STENT INTERVENTION N/A 08/06/2020   Procedure: CORONARY STENT INTERVENTION;  Surgeon: Sherren Mocha, MD;  Location: [facility];  Service: Cardiovascular;  Laterality: N/A;   EYE SURGERY Bilateral    cataracts removed   MASTOIDECTOMY     L ear   MITRAL VALVE REPAIR     MITRAL VALVE REPAIR N/A 08/19/2020   Procedure: MITRAL VALVE REPAIR;  Surgeon: Sherren Mocha, MD;  Location: [facility];  Service: Cardiovascular;  Laterality: N/A;   PROSTATECTOMY     RIGHT/LEFT HEART CATH AND CORONARY  ANGIOGRAPHY N/A 08/06/2020   Procedure: RIGHT/LEFT HEART CATH AND CORONARY ANGIOGRAPHY;  Surgeon: Sherren Mocha, MD;  Location: [facility];  Service: Cardiovascular;  Laterality: N/A;   TEE WITHOUT CARDIOVERSION N/A 08/06/2020   Procedure: TRANSESOPHAGEAL ECHOCARDIOGRAM (TEE);  Surgeon: Sanda Klein, MD;  Location: [facility];  Service: Cardiovascular;  Laterality: N/A;   TEE WITHOUT CARDIOVERSION  08/19/2020   TEE WITHOUT CARDIOVERSION N/A 08/19/2020   Procedure: TRANSESOPHAGEAL ECHOCARDIOGRAM (TEE);  Surgeon: Sherren Mocha, MD;  Location: [facility];  Service: Cardiovascular;  Laterality: N/A;   Patient Active Problem List   Diagnosis Date Noted   Acute ischemic left posterior cerebral artery (PCA) stroke ([place]) 04/07/2022   Acute ischemic stroke ([place]) 04/02/2022   Hypokalemia 04/02/2022   Fall at home, initial encounter 04/02/2022   Mitral valve disorder 01/16/2022   Paresthesia 01/16/2022   Exudative age-related macular degeneration of left eye with active choroidal neovascularization ([place]) 11/14/2021   Advanced nonexudative age-related macular degeneration of right eye without subfoveal involvement 11/14/2021   Advanced nonexudative age-related macular degeneration of left eye without subfoveal involvement 11/14/2021   S/P mitral valve clip implantation 08/19/2020   Prostate cancer ([place])    PVC's (premature ventricular contractions)    Peripheral vascular disease ([place])  Coronary artery disease    Nonrheumatic mitral valve regurgitation    Anemia in chronic kidney disease 04/24/2020   Stage 3b chronic kidney disease ([place]) 04/24/2020   History of malignant neoplasm of prostate 04/24/2020   Hypertensive renal disease 04/24/2020   Otorrhea of left ear 01/12/2020   Tympanic membrane perforation, left 01/12/2020   Acute non-recurrent sinusitis 08/07/2018   Tympanic membrane perforation, marginal, right 08/07/2018   Cholesteatoma of attic of ear, left 01/23/2017    Conductive hearing loss of both ears 01/23/2017   Laryngopharyngeal reflux (LPR) 01/23/2017   Perennial allergic rhinitis 01/23/2017   Sensorineural hearing loss (SNHL), bilateral 08/17/2015   Bilateral chronic secretory otitis media 08/09/2015   Chronic swimmer's ear of both sides 08/09/2015   History of colonic polyps 08/09/2015   Dysfunction of both eustachian tubes 07/19/2015   Primary hypertension 02/27/2014   Hyperlipidemia 06/10/2013    ONSET DATE: 04/20/2022 (referral date)   REFERRING DIAG: I63.9 (ICD-10-CM) - CVA (cerebral vascular accident) ([place])  THERAPY DIAG:  Hemiplegia and hemiparesis following cerebral infarction affecting right dominant side ([place])  Other lack of coordination  Visuospatial deficit  Other symptoms and signs involving cognitive functions following cerebral infarction  Rationale for Evaluation and Treatment: Rehabilitation  SUBJECTIVE:   SUBJECTIVE STATEMENT: Pt goes by [name]. Daughter reports vision affected, old rotator cuff tear Rt shoulder, macular degeneration Pt accompanied by:  Derek Jack [name])   PERTINENT HISTORY:  He [facility]22 with right-sided weakness, mild dysarthria and right hemianopsia on evaluation. Imaging was significant for left PICA infarct.  PMH: Primary hypertension   Stage 3b chronic kidney disease ([place]) Functional deficits secondary to acute ischemic left posterior cerebral artery stroke Multiple skin tears Hyperlipidemia Coronary artery disease Prostate cancer Mitral regurgitation GERD Diplopia Macular degeneration  PRECAUTIONS: Other: no driving, heart monitor  WEIGHT BEARING RESTRICTIONS: No  PAIN:  Are you having pain? No  FALLS: Has patient fallen in last 6 months? Yes. Number of falls 1 w/ stroke  LIVING ENVIRONMENT: Lives with: lives with their family and lives alone, currently staying with daughter Pt lives in 2 story home but stays on main level, level entry.  (Daughter has 6 STE her house)  Has following equipment at home: Environmental consultant - 2 wheeled, shower chair, Shower bench, and Grab bars  PLOF: Independent  PATIENT GOALS: shower I'ly  OBJECTIVE:   HAND DOMINANCE: Right  ADLs: Eating: using bigger handles, sometimes assist cutting meat Grooming: mod I w/ electric razor UB Dressing: independent  LB Dressing: supervision with donning underwear on shower chair Toileting: independent Bathing: mod I seated Tub Shower transfers: supervision/cues Equipment: Radio broadcast assistant and Grab bars  *Pt's daughter reports that he will most likely not return to living alone  IADLs: Shopping: dependent Light housekeeping: dependent (pt had housekeeper every 2 weeks for heavier cleaning)  Meal Prep: dependent  Community mobility: relies on family for transportation Medication management: family fills pillbox and reminds patient at times Financial management: daughter POA and now taking over Handwriting: 25% legible (premorbidly was not great but worse since stroke)   MOBILITY STATUS:  sometimes uses walker   UPPER EXTREMITY ROM:  Rt shoulder limited somewhat premorbidly d/t rotator cuff tear (not surgical candidate). BUE's elbows distally WFL's  HAND FUNCTION: Grip strength: Right: 60.6 lbs; Left: 56.4 lbs  COORDINATION: 9 Hole Peg test: Right: 49.09  sec; Left: 36.75 sec (Rt shoulder slightly limited coordination as well)   SENSATION: Diminshed/dull RT hand  EDEMA: none  COGNITION: Overall cognitive status: Impaired and short term memory  VISION: Subjective report: Rt eye was affected from stroke (per daughter report) Baseline vision: Bifocals and Wears glasses all the time Visual history: macular degeneration (wet)   VISION ASSESSMENT: Visual Fields: Right homonymous hemianopsia and Right visual field deficits     TODAY'S TREATMENT:                                                                                                                                Pt/daughter instructed to practice picking up pennies, flipping cards, practice writing with Rt hand until next visit where we can provide more formal HEP   PATIENT EDUCATION: SEE ABOVE  HOME EXERCISE PROGRAM: N/A   GOALS: Goals reviewed with patient? Yes  SHORT TERM GOALS: Target date: 06/09/22  Independent with HEP for Rt hand coordination Baseline: Goal status: INITIAL  2.  Pt will verbalize understanding with visual scanning strategies Baseline:  Goal status: INITIAL  3.  Pt will verbalize understanding with memory strategies for ADLS Baseline:  Goal status: INITIAL  4.  Pt will consistently cut food Baseline:  Goal status: INITIAL   LONG TERM GOALS: Target date: 07/10/22  Independent with updated HEP prn, and review HEP for Rt shoulder (provided by ortho clinic)  Baseline:  Goal status: INITIAL  2.  Pt to increase handwriting to 75% legibility in print for name and address Baseline:  Goal status: INITIAL  3.  Pt to perform environmental scanning at 75% or greater legibility Baseline:  Goal status: INITIAL  4.  Pt to perform light IADLS (folding clothes, washing dishes) and simple sandwich/microwaveable items consistently and safely at mod I level Baseline:  Goal status: INITIAL   ASSESSMENT:  CLINICAL IMPRESSION: Patient is a 88 y.o. male who was seen today for occupational therapy evaluation for stroke w/ residual deficits in Rt hand coordination, visual field deficits to Rt, slight balance and cognitive changes. Pt would benefit from O.T. to address these deficits and increase safety and participation in [place]: in functional skills including ADLs, IADLs, coordination, dexterity, ROM, strength, Fine motor control, Gross motor control, balance, decreased knowledge of use of DME, vision, and UE functional use, cognitive skills including attention, memory, problem solving, and safety awareness.   IMPAIRMENTS:  are limiting patient from ADLs, IADLs, work, and leisure.   CO-MORBIDITIES: has co-morbidities such as macular degeneration  that affects occupational performance. Patient will benefit from skilled OT to address above impairments and improve overall function.  MODIFICATION OR ASSISTANCE TO COMPLETE EVALUATION: No modification of tasks or assist necessary to complete an evaluation.  OT OCCUPATIONAL PROFILE AND HISTORY: Detailed assessment: Review of records and additional review of physical, cognitive, psychosocial history related to current functional performance.  CLINICAL DECISION MAKING: Moderate - several treatment options, min-mod task modification necessary  REHAB POTENTIAL: Good  EVALUATION COMPLEXITY: Moderate    PLAN:  OT FREQUENCY: 2x/week  OT DURATION: 8  weeks (anticipate only 6 weeks needed)   PLANNED INTERVENTIONS: self care/ADL training, therapeutic exercise, therapeutic activity, neuromuscular re-education, passive range of motion, functional mobility training, fluidotherapy, moist heat, patient/family education, cognitive remediation/compensation, visual/perceptual remediation/compensation, coping strategies training, and DME and/or AE instructions  RECOMMENDED OTHER SERVICES: none at this time  CONSULTED AND AGREED WITH PLAN OF CARE: Patient and family member/caregiver  PLAN FOR NEXT SESSION: coordination HEP, visual scanning strategies   Hans Eden, OT 05/09/2022, 3:09 PM

## 2022-05-09 ENCOUNTER — Ambulatory Visit: Payer: Medicare Other | Attending: Physician Assistant

## 2022-05-09 ENCOUNTER — Ambulatory Visit: Payer: Medicare Other | Admitting: Physical Therapy

## 2022-05-09 ENCOUNTER — Ambulatory Visit: Payer: Medicare Other | Admitting: Occupational Therapy

## 2022-05-09 ENCOUNTER — Encounter: Payer: Self-pay | Admitting: Physical Therapy

## 2022-05-09 DIAGNOSIS — M6281 Muscle weakness (generalized): Secondary | ICD-10-CM | POA: Insufficient documentation

## 2022-05-09 DIAGNOSIS — R2689 Other abnormalities of gait and mobility: Secondary | ICD-10-CM

## 2022-05-09 DIAGNOSIS — I69351 Hemiplegia and hemiparesis following cerebral infarction affecting right dominant side: Secondary | ICD-10-CM | POA: Insufficient documentation

## 2022-05-09 DIAGNOSIS — R41842 Visuospatial deficit: Secondary | ICD-10-CM

## 2022-05-09 DIAGNOSIS — R2681 Unsteadiness on feet: Secondary | ICD-10-CM

## 2022-05-09 DIAGNOSIS — R41841 Cognitive communication deficit: Secondary | ICD-10-CM | POA: Insufficient documentation

## 2022-05-09 DIAGNOSIS — R278 Other lack of coordination: Secondary | ICD-10-CM | POA: Insufficient documentation

## 2022-05-09 DIAGNOSIS — I69318 Other symptoms and signs involving cognitive functions following cerebral infarction: Secondary | ICD-10-CM

## 2022-05-09 DIAGNOSIS — R4701 Aphasia: Secondary | ICD-10-CM | POA: Diagnosis not present

## 2022-05-09 NOTE — Therapy (Signed)
OUTPATIENT PHYSICAL THERAPY NEURO EVALUATION   Patient Name: Adam Arellano MRN: [id number] DOB:March 31, 1932, 88 y.o., male Today's Date: 05/09/2022   PCP: Adam Manes, MD REFERRING PROVIDER: Cathlyn Parsons, PA-C  END OF SESSION:  PT End of Session - 05/09/22 1034     Visit Number 1    Number of Visits 17    Date for PT Re-Evaluation 07/07/22    Authorization Type [employer] Medicare    Authorization Time Period 05-09-22 - 07-07-22    PT Start Time 0850    PT Stop Time 0930    PT Time Calculation (min) 40 min    Equipment Utilized During Treatment Gait belt    Activity Tolerance Patient tolerated treatment well    Behavior During Therapy WFL for tasks assessed/performed             Past Medical History:  Diagnosis Date   Asthma    a. Childhood.   Coronary artery disease    a. NSTEMI s/p DES to dRCA 2007. b. Angina 07/2009: s/p DESx2 to mid RCA for ISR and prox RCA, EF 65%. c. Stress test 05/2013: low risk, no evidence of ischemia, EF 61%.   Dyslipidemia    GERD (gastroesophageal reflux disease)    History of kidney stones    Hypertension    Peripheral vascular disease (HCC)    Prostate cancer ([place])    a. s/p radical prostatectomy.   PVC's (premature ventricular contractions)    S/P mitral valve clip implantation 08/19/2020   s/p MitraClip implantation with a XTW by Dr. Burt Arellano    Transient global amnesia    a. Adm 2009: imaging nonacute, had resolved.   Past Surgical History:  Procedure Laterality Date   CARDIAC CATHETERIZATION     COLONOSCOPY     CORONARY STENT INTERVENTION N/A 08/06/2020   Procedure: CORONARY STENT INTERVENTION;  Surgeon: Adam Mocha, MD;  Location: [facility];  Service: Cardiovascular;  Laterality: N/A;   EYE SURGERY Bilateral    cataracts removed   MASTOIDECTOMY     L ear   MITRAL VALVE REPAIR     MITRAL VALVE REPAIR N/A 08/19/2020   Procedure: MITRAL VALVE REPAIR;  Surgeon: Adam Mocha, MD;  Location: [facility];   Service: Cardiovascular;  Laterality: N/A;   PROSTATECTOMY     RIGHT/LEFT HEART CATH AND CORONARY ANGIOGRAPHY N/A 08/06/2020   Procedure: RIGHT/LEFT HEART CATH AND CORONARY ANGIOGRAPHY;  Surgeon: Adam Mocha, MD;  Location: [facility];  Service: Cardiovascular;  Laterality: N/A;   TEE WITHOUT CARDIOVERSION N/A 08/06/2020   Procedure: TRANSESOPHAGEAL ECHOCARDIOGRAM (TEE);  Surgeon: Adam Klein, MD;  Location: [facility];  Service: Cardiovascular;  Laterality: N/A;   TEE WITHOUT CARDIOVERSION  08/19/2020   TEE WITHOUT CARDIOVERSION N/A 08/19/2020   Procedure: TRANSESOPHAGEAL ECHOCARDIOGRAM (TEE);  Surgeon: Adam Mocha, MD;  Location: [facility];  Service: Cardiovascular;  Laterality: N/A;   Patient Active Problem List   Diagnosis Date Noted   Acute ischemic left posterior cerebral artery (PCA) stroke ([place]) 04/07/2022   Acute ischemic stroke ([place]) 04/02/2022   Hypokalemia 04/02/2022   Fall at home, initial encounter 04/02/2022   Mitral valve disorder 01/16/2022   Paresthesia 01/16/2022   Exudative age-related macular degeneration of left eye with active choroidal neovascularization ([place]) 11/14/2021   Advanced nonexudative age-related macular degeneration of right eye without subfoveal involvement 11/14/2021   Advanced nonexudative age-related macular degeneration of left eye without subfoveal involvement 11/14/2021   S/P mitral valve clip implantation  08/19/2020   Prostate cancer ([place])    PVC's (premature ventricular contractions)    Peripheral vascular disease (HCC)    Coronary artery disease    Nonrheumatic mitral valve regurgitation    Anemia in chronic kidney disease 04/24/2020   Stage 3b chronic kidney disease ([place]) 04/24/2020   History of malignant neoplasm of prostate 04/24/2020   Hypertensive renal disease 04/24/2020   Otorrhea of left ear 01/12/2020   Tympanic membrane perforation, left 01/12/2020   Acute non-recurrent sinusitis 08/07/2018   Tympanic  membrane perforation, marginal, right 08/07/2018   Cholesteatoma of attic of ear, left 01/23/2017   Conductive hearing loss of both ears 01/23/2017   Laryngopharyngeal reflux (LPR) 01/23/2017   Perennial allergic rhinitis 01/23/2017   Sensorineural hearing loss (SNHL), bilateral 08/17/2015   Bilateral chronic secretory otitis media 08/09/2015   Chronic swimmer's ear of both sides 08/09/2015   History of colonic polyps 08/09/2015   Dysfunction of both eustachian tubes 07/19/2015   Primary hypertension 02/27/2014   Hyperlipidemia 06/10/2013    ONSET DATE: 04-02-22  REFERRING DIAG:  Diagnosis  I63.9 (ICD-10-CM) - CVA (cerebral vascular accident) ([place])    THERAPY DIAG:  Other abnormalities of gait and mobility  Unsteadiness on feet  Hemiplegia and hemiparesis following cerebral infarction affecting right dominant side (HCC)  Muscle weakness (generalized)  Rationale for Evaluation and Treatment: Rehabilitation  SUBJECTIVE:                                                                                                                                                                                             SUBJECTIVE STATEMENT: Pt ambulating to PT eval with RW accompanied by his daughter, Adam Arellano; pt had CVA on 04-02-22 and was admitted to [facility]; he was discharged on 04-07-22 to inpatient rehab at [facility]; discharged to his daughter's home on 04-21-22; daughter states pt is currently living with her and her husband and sometimes they go stay at his house    Pt accompanied by: family member Adam Arellano  PERTINENT HISTORY: Brief HPI:   Adam Arellano is a 88 y.o. male  who was found down at his home my his neighbor on 04/02/2022. He presented to the [facility] ED with right-sided weakness, mild dysarthria and right hemianopsia on evaluation. Imaging was significant for left PICA infarct. No apparent injuries. Neurology consulted and resumed his Plavix and started aspirin 81 mg  for 3 months then aspirin alone. Outside of window for tenecteplase and no evidence of LVO. Recommended resuming home statin; however, per PCP notes, the patient has stopped his home Crestor due to "aches". Discharge Diagnoses:  Principal Problem:  Acute ischemic left posterior cerebral artery (PCA) stroke (HCC) Active Problems:   Primary hypertension   Stage 3b chronic kidney disease (HCC) Functional deficits secondary to acute ischemic left posterior cerebral artery stroke Multiple skin tears Hyperlipidemia Coronary artery disease Prostate cancer Mitral regurgitation GERD Diplopia consipation        PAIN:  Are you having pain? No  PRECAUTIONS: Fall  WEIGHT BEARING RESTRICTIONS: No  FALLS: Has patient fallen in last 6 months? No  LIVING ENVIRONMENT: Lives with: lives with their daughter and husband Lives in: House/apartment Stairs: Yes: External: 6 steps; bilateral but cannot reach both; No steps at his house Has following equipment at home: Gilford Rile - 2 wheeled and Shower bench  PLOF: Independent  PATIENT GOALS: "Get back to being myself again"  OBJECTIVE:   DIAGNOSTIC FINDINGS: MRI:   Acute left PCA territory infarct.  COGNITION: Overall cognitive status:  decreased memory    SENSATION: WFL  COORDINATION: WNL's bil. LE's   POSTURE: rounded shoulders, forward head, and increased thoracic kyphosis  LOWER EXTREMITY ROM:   WFL's bil. LE's   LOWER EXTREMITY MMT:  WFL's bil. LE's   BED MOBILITY:  Independent   TRANSFERS: Assistive device utilized: Environmental consultant - 2 wheeled  Sit to stand: SBA Stand to sit: Modified independence  Car transfers - SBA per daughter's report  RAMP: TBA  CURB: TBA   STAIRS: Level of Assistance: SBA Stair Negotiation Technique: Alternating Pattern  Forwards with Bilateral Rails Number of Stairs: 4  Height of Stairs: 6  Comments: Daughter states pt places both hands on rail at steps at her house - both with ascension  and descension  GAIT: Gait pattern: step through pattern, trunk flexed, and wide BOS Distance walked: 100' Assistive device utilized: Environmental consultant - 2 wheeled and None; no device used during eval except for TUG test Level of assistance: CGA Comments: pt has slight Trendelenburg gait RLE but daughter states his gait pattern is essentially the same now as it was prior to CVA  FUNCTIONAL TESTS:  5 times sit to stand: 17.68 secs without UE support from mat Timed up and go (TUG): 19.53 secs with RW;  15.16 secs without RW 10 meter walk test: 2.40 ft/sec = 13.69 secs without RW Berg Balance Scale: 35/56   [facility] PT Assessment - 05/09/22 0001       Berg Balance Test   Sit to Stand Able to stand without using hands and stabilize independently    Standing Unsupported Able to stand safely 2 minutes    Sitting with Back Unsupported but Feet Supported on Floor or Stool Able to sit safely and securely 2 minutes    Stand to Sit Controls descent by using hands    Transfers Able to transfer safely, definite need of hands    Standing Unsupported with Eyes Closed Able to stand 3 seconds    Standing Unsupported with Feet Together Able to place feet together independently but unable to hold for 30 seconds    From Standing, Reach Forward with Outstretched Arm Can reach forward >12 cm safely (5")    From Standing Position, Pick up Object from Floor Able to pick up shoe, needs supervision    From Standing Position, Turn to Look Behind Over each Shoulder Turn sideways only but maintains balance    Turn 360 Degrees Needs close supervision or verbal cueing    Standing Unsupported, Alternately Place Feet on Step/Stool Able to complete >2 steps/needs minimal assist    Standing Unsupported, One Foot in  Front Able to take small step independently and hold 30 seconds    Standing on One Leg Tries to lift leg/unable to hold 3 seconds but remains standing independently    Total Score 35              PATIENT SURVEYS:   FOTO not captured by front office - to be completed next session   PATIENT EDUCATION: Education details: eval results and estimated POC 8 weeks; daughter inquired if patient would be able to safely amb. With cart in a store - replied yes, but may need cues to stay close to cart due to him pushing/ambulating with RW too far ahead of him; daughter asked about ability to amb. With use of gait belt (no RW) in paved driveway at home - informed her that this would be very beneficial for him Person educated: Patient and Child(ren) Education method: Explanation Education comprehension: verbalized understanding  HOME EXERCISE PROGRAM: To be established  GOALS: Goals reviewed with patient? Yes  SHORT TERM GOALS: Target date: 06-09-21  Improve Berg score to >/= 40/56 to demo improved balance and to decrease fall risk. Baseline: 35/56 Goal status: INITIAL  2.  Pt will improve TUG score without use of RW from 15.16 secs to </= 14 secs for reduced fall risk. Baseline: 15.16 secs Goal status: INITIAL  3.  Amb. Household distances modified independently without use of RW. Baseline:  Goal status: INITIAL  4.  Amb. 500' without device with CGA on flat even, uneven surfaces for increased community accessibility. Baseline:  Goal status: INITIAL  5.  Independent in HEP for balance and strengthening exercises.  Baseline:  Goal status: INITIAL   LONG TERM GOALS: Target date: 07-07-21  Improve FOTO score by at least 10 points to demo improvement in mobility. Baseline:  Goal status: INITIAL  Improve Berg score to >/= 46/56 to demo improved balance and to decrease fall risk. Baseline: 35/56 Goal status: INITIAL  3.   Pt will improve TUG score without use of RW from 15.16 secs to </= 12 secs for reduced fall risk. Baseline: 15.16 secs Goal status: INITIAL  4.   Amb. 800' without device with supervision on flat even, uneven surfaces for increased community accessibility. Baseline:  Goal  status: INITIAL  5.  Pt will perform floor to stand transfer with UE support with SBA.  Baseline:  Goal status: INITIAL  6.  Independent in updated HEP for balance and strengthening exs.  Baseline:  Goal status: INITIAL   ASSESSMENT:  CLINICAL IMPRESSION: Patient is a 88 y.o. gentleman who was seen today for physical therapy evaluation and treatment for gait and balance deficits due to Lt PCA CVA, sustained on 04-02-22.  Pt was hospitalized until 04-07-22 and then transferred to inpatient rehab at [facility] until 04-21-22, then discharged home with daughter.  Pt is currently ambulating modified independently in home with use of RW but pushes walker too far in front of him, resulting in forward flexed posture.  Pt is at fall risk per Merrilee Jansky balance test score of 35 secs;  TUG score with RW is 19.53 secs (fall risk as score > 13.5 secs); TUG score without RW is 15.16 secs. Pt able to amb. Without device with CGA approx. 100' on flat, even surface.  Pt has Lt hemianopsia and also memory deficits.  Pt will benefit from skilled PT to address strengthening, gait and balance deficits.   OBJECTIVE IMPAIRMENTS: decreased activity tolerance, decreased balance, decreased knowledge of use of DME, decreased  strength, and impaired vision/preception.   ACTIVITY LIMITATIONS: carrying, bending, standing, squatting, transfers, and locomotion level  PARTICIPATION LIMITATIONS: meal prep, cleaning, laundry, driving, shopping, community activity, and yard work  PERSONAL FACTORS: Behavior pattern and 1-2 comorbidities: diplopia and mild impulsivity  are also affecting patient's functional outcome.   REHAB POTENTIAL: Good  CLINICAL DECISION MAKING: Evolving/moderate complexity  EVALUATION COMPLEXITY: Moderate  PLAN:  PT FREQUENCY: 2x/week  PT DURATION: 8 weeks  PLANNED INTERVENTIONS: Therapeutic exercises, Therapeutic activity, Neuromuscular re-education, Balance training, Gait training, Patient/Family  education, Self Care, Stair training, and DME instructions  PLAN FOR NEXT SESSION: establish balance HEP; gait train without device, balance exercises, functional strengthening   Maurica Omura, [place], PT 05/09/2022, 10:48 AM

## 2022-05-09 NOTE — Therapy (Signed)
OUTPATIENT SPEECH LANGUAGE PATHOLOGY EVALUATION   Patient Name: Adam Arellano MRN: [id number] DOB:01/09/1932, 88 y.o., male Today's Date: 05/09/2022  PCP: Lajean Manes MD  REFERRING PROVIDER: Cathlyn Parsons, PA-C  END OF SESSION:  End of Session - 05/09/22 1138     Visit Number 1    Number of Visits 17    Date for SLP Re-Evaluation 07/04/22    Authorization Type UHC Medicare    SLP Start Time 0800    SLP Stop Time  0845    SLP Time Calculation (min) 45 min    Activity Tolerance Patient tolerated treatment well             Past Medical History:  Diagnosis Date   Asthma    a. Childhood.   Coronary artery disease    a. NSTEMI s/p DES to dRCA 2007. b. Angina 07/2009: s/p DESx2 to mid RCA for ISR and prox RCA, EF 65%. c. Stress test 05/2013: low risk, no evidence of ischemia, EF 61%.   Dyslipidemia    GERD (gastroesophageal reflux disease)    History of kidney stones    Hypertension    Peripheral vascular disease (HCC)    Prostate cancer ([place])    a. s/p radical prostatectomy.   PVC's (premature ventricular contractions)    S/P mitral valve clip implantation 08/19/2020   s/p MitraClip implantation with a XTW by Dr. Burt Knack    Transient global amnesia    a. Adm 2009: imaging nonacute, had resolved.   Past Surgical History:  Procedure Laterality Date   CARDIAC CATHETERIZATION     COLONOSCOPY     CORONARY STENT INTERVENTION N/A 08/06/2020   Procedure: CORONARY STENT INTERVENTION;  Surgeon: Sherren Mocha, MD;  Location: [facility];  Service: Cardiovascular;  Laterality: N/A;   EYE SURGERY Bilateral    cataracts removed   MASTOIDECTOMY     L ear   MITRAL VALVE REPAIR     MITRAL VALVE REPAIR N/A 08/19/2020   Procedure: MITRAL VALVE REPAIR;  Surgeon: Sherren Mocha, MD;  Location: [facility] [facility];  Service: Cardiovascular;  Laterality: N/A;   PROSTATECTOMY     RIGHT/LEFT HEART CATH AND CORONARY ANGIOGRAPHY N/A 08/06/2020   Procedure: RIGHT/LEFT HEART  CATH AND CORONARY ANGIOGRAPHY;  Surgeon: Sherren Mocha, MD;  Location: [facility];  Service: Cardiovascular;  Laterality: N/A;   TEE WITHOUT CARDIOVERSION N/A 08/06/2020   Procedure: TRANSESOPHAGEAL ECHOCARDIOGRAM (TEE);  Surgeon: Sanda Klein, MD;  Location: [facility];  Service: Cardiovascular;  Laterality: N/A;   TEE WITHOUT CARDIOVERSION  08/19/2020   TEE WITHOUT CARDIOVERSION N/A 08/19/2020   Procedure: TRANSESOPHAGEAL ECHOCARDIOGRAM (TEE);  Surgeon: Sherren Mocha, MD;  Location: [place] [facility];  Service: Cardiovascular;  Laterality: N/A;   Patient Active Problem List   Diagnosis Date Noted   Acute ischemic left posterior cerebral artery (PCA) stroke ([place]) 04/07/2022   Acute ischemic stroke ([place]) 04/02/2022   Hypokalemia 04/02/2022   Fall at home, initial encounter 04/02/2022   Mitral valve disorder 01/16/2022   Paresthesia 01/16/2022   Exudative age-related macular degeneration of left eye with active choroidal neovascularization ([place]) 11/14/2021   Advanced nonexudative age-related macular degeneration of right eye without subfoveal involvement 11/14/2021   Advanced nonexudative age-related macular degeneration of left eye without subfoveal involvement 11/14/2021   S/P mitral valve clip implantation 08/19/2020   Prostate cancer ([place])    PVC's (premature ventricular contractions)    Peripheral vascular disease ([place])    Coronary artery disease  Nonrheumatic mitral valve regurgitation    Anemia in chronic kidney disease 04/24/2020   Stage 3b chronic kidney disease ([place]) 04/24/2020   History of malignant neoplasm of prostate 04/24/2020   Hypertensive renal disease 04/24/2020   Otorrhea of left ear 01/12/2020   Tympanic membrane perforation, left 01/12/2020   Acute non-recurrent sinusitis 08/07/2018   Tympanic membrane perforation, marginal, right 08/07/2018   Cholesteatoma of attic of ear, left 01/23/2017   Conductive hearing loss of both ears 01/23/2017    Laryngopharyngeal reflux (LPR) 01/23/2017   Perennial allergic rhinitis 01/23/2017   Sensorineural hearing loss (SNHL), bilateral 08/17/2015   Bilateral chronic secretory otitis media 08/09/2015   Chronic swimmer's ear of both sides 08/09/2015   History of colonic polyps 08/09/2015   Dysfunction of both eustachian tubes 07/19/2015   Primary hypertension 02/27/2014   Hyperlipidemia 06/10/2013    ONSET DATE: 04-02-22  REFERRING DIAG: I63.9 (ICD-10-CM) - CVA (cerebral vascular accident   THERAPY DIAG:  Cognitive communication deficit  Aphasia  Rationale for Evaluation and Treatment: Rehabilitation  SUBJECTIVE:   SUBJECTIVE STATEMENT: "He doesn't remember the hospitalization" Pt accompanied by: family member daughter - Adam Arellano   PERTINENT HISTORY: Adam Arellano is an 88 year old male who was found down at his home by his neighbor on 04/02/2022. He presented to the [facility] ED with right-sided weakness, mild dysarthria and right hemianopsia on evaluation. Imaging was significant for left PICA infarct.  PAIN: Are you having pain? No  FALLS: Has patient fallen in last 6 months?  No  LIVING ENVIRONMENT: Lives with: lives with their daughter (lived alone prior)  Lives in: House/apartment  PLOF:  Level of assistance: Independent with ADLs, Independent with IADLs Employment: Part-time employment   PATIENT GOALS: "get back to work"  OBJECTIVE:   COGNITION: Overall cognitive status: Impaired Areas of impairment:  Attention: Impaired: Focused, Sustained, Alternating, Divided Memory: Impaired: Immediate Working Short term Prospective Awareness: Impaired: Intellectual, Emergent, and Anticipatory Executive function: Impaired: Impulse control, Organization, Error awareness, and Slow processing Functional deficits: Pt largely unaware of cognitive deficits with daughter providing insight into memory change, particularly short term recall, and communication changes, including  occasional word finding. Of note, daughter suggested he was impulsive at baseline prior to stroke.   AUDITORY COMPREHENSION: Overall auditory comprehension: Appears intact YES/NO questions: Appears intact Following directions: Appears intact Conversation: Simple Interfering components: visual impairments, hearing, processing speed, and working memory Effective technique: extra processing time, repetition/stressing words, and slowed speech  READING COMPREHENSION: Not tested  EXPRESSION: verbal  VERBAL EXPRESSION: Level of generative/spontaneous verbalization: conversation Automatic speech: social response: intact  Naming: Confrontation: 51-75% and Divergent: 51-75% Pragmatics: Appears intact Interfering components:  cognition  Effective technique: semantic cues, phonemic cues, and written cues Comments: No overt anomia/dysnomia exhibited in conversation; however, pt very reliant on daughter for prompting impacting his active participation in conversation today  WRITTEN EXPRESSION: Dominant hand: right (impacted by stroke)  MOTOR SPEECH: Overall motor speech: Appears intact  ORAL MOTOR EXAMINATION: Overall status: WFL  CLINICAL SWALLOW ASSESSMENT:   Current diet: regular and thin liquids Comments: denied swallowing changes/concerns. Consumed thin liquids with no overt s/sx of aspiration   STANDARDIZED ASSESSMENTS: SLUMS: 9/30 and [place] NAMING TEST: 6/5  PATIENT REPORTED OUTCOME MEASURES (PROM): Not completed due to time constraints   TODAY'S TREATMENT:  05-09-22: Trialed education and instruction of novel information (take sip of water/swallow versus "nervous cough"). Pt able to recall new instructions immediately; however, pt unable to recall after short delay. Usual prompting and repetition were effective to aid recall of new information  throughout evaluation. Educated patient and daughter on POC to identify and address functional goals to assist with maximizing patient return to baseline. Both verbalized understanding and agreement with POC.    PATIENT EDUCATION: Education details: see above Person educated: Patient and Child(ren) Education method: Customer service manager Education comprehension: verbalized understanding, returned demonstration, and needs further education   GOALS: Goals reviewed with patient? Yes  SHORT TERM GOALS: Target date: 06/06/2022  Pt will utilize word retrieval strategies on structured speech tasks with 80% accuracy given occasional mod A  Baseline: Goal status: INITIAL  2.  Pt will recall novel strategies to reduce "nervous cough" for 4/5 opportunities given occasional mod A over 2 sessions  Baseline:  Goal status: INITIAL  3.  Pt will utilize memory supports to aid recall on structured functional tasks (med management, etc) given occasional mod A over 2 sessions  Baseline:  Goal status: INITIAL   LONG TERM GOALS: Target date: 07/04/2022  Pt will utilize word retrieval strategies to effectively relay work related information for 2/2 opportunities given occasional min A  Baseline:  Goal status: INITIAL  2.  Pt will recall personally relevant information (medications, etc) with use of memory supports given occasional min A over 2 sessions  Baseline:  Goal status: INITIAL  3.  Pt will demonstrate adequate cognitive ability to accurately perform functional tasks related to job with use of cognitive compensations given occasional min A Baseline:  Goal status: INITIAL  ASSESSMENT:  CLINICAL IMPRESSION: Patient is a 88 y.o. male who was seen today for CVA in October 2023. Daughter, Adam Arellano, provides past hx due to patient short term recall deficits. Pt was independent prior, including living alone and working part time. Baseline impulsivity endorsed by daughter, which was  evidenced in fast, unsteady transfers today. Impaired short term recall and reduced awareness exhibited with limited awareness of current deficits or functional impact of his memory deficits observed. He currently lives with his daughter who is managing his medications and finances. Pt desires to return to work in some capacity. Recall benefited from repetition and cues this session. Occasional word finding reported and exhibited (albeit improved since hospitalization), with pt identifying 6/15 items on BNT. Descriptions, first letter cues, and written cues were intermittently effective. Hearing loss and changes in vision may have been contributing to patient performance this session. Pt would benefit from skilled ST intervention to maximize cognitive function and communication effectiveness to optimize patient return to baseline.   OBJECTIVE IMPAIRMENTS: include attention, memory, awareness, executive functioning, and aphasia. These impairments are limiting patient from managing medications, managing appointments, managing finances, household responsibilities, ADLs/IADLs, and effectively communicating at home and in community. Factors affecting potential to achieve goals and functional outcome are ability to learn/carryover information. Patient will benefit from skilled SLP services to address above impairments and improve overall function.  REHAB POTENTIAL: Good  PLAN:  SLP FREQUENCY: 2x/week  SLP DURATION: 8 weeks  PLANNED INTERVENTIONS: Language facilitation, Cueing hierachy, Cognitive reorganization, Internal/external aids, Functional tasks, Multimodal communication approach, SLP instruction and feedback, Compensatory strategies, and Patient/family education    Marzetta Board, CCC-SLP 05/09/2022, 11:39 AM

## 2022-05-16 ENCOUNTER — Encounter: Payer: Self-pay | Admitting: Physical Therapy

## 2022-05-16 ENCOUNTER — Ambulatory Visit: Payer: Medicare Other | Admitting: Physical Therapy

## 2022-05-16 DIAGNOSIS — R41841 Cognitive communication deficit: Secondary | ICD-10-CM | POA: Diagnosis not present

## 2022-05-16 DIAGNOSIS — R2689 Other abnormalities of gait and mobility: Secondary | ICD-10-CM | POA: Diagnosis not present

## 2022-05-16 DIAGNOSIS — R2681 Unsteadiness on feet: Secondary | ICD-10-CM

## 2022-05-16 DIAGNOSIS — I69351 Hemiplegia and hemiparesis following cerebral infarction affecting right dominant side: Secondary | ICD-10-CM | POA: Diagnosis not present

## 2022-05-16 DIAGNOSIS — R278 Other lack of coordination: Secondary | ICD-10-CM | POA: Diagnosis not present

## 2022-05-16 DIAGNOSIS — M6281 Muscle weakness (generalized): Secondary | ICD-10-CM

## 2022-05-16 DIAGNOSIS — R4701 Aphasia: Secondary | ICD-10-CM | POA: Diagnosis not present

## 2022-05-16 DIAGNOSIS — I69318 Other symptoms and signs involving cognitive functions following cerebral infarction: Secondary | ICD-10-CM | POA: Diagnosis not present

## 2022-05-16 NOTE — Therapy (Signed)
OUTPATIENT PHYSICAL THERAPY NEURO TREATMENT   Patient Name: Adam Arellano MRN: [id number] DOB:1931/09/08, 88 y.o., male Today's Date: 05/16/2022   PCP: Lajean Manes, MD REFERRING PROVIDER: Cathlyn Parsons, PA-C  END OF SESSION:  PT End of Session - 05/16/22 1116     Visit Number 2    Number of Visits 17    Date for PT Re-Evaluation 07/07/22    Authorization Type UHC Medicare    Authorization Time Period 05-09-22 - 07-07-22    PT Start Time 0845    PT Stop Time 0931    PT Time Calculation (min) 46 min    Equipment Utilized During Treatment Gait belt    Activity Tolerance Patient tolerated treatment well    Behavior During Therapy WFL for tasks assessed/performed              Past Medical History:  Diagnosis Date   Asthma    a. Childhood.   Coronary artery disease    a. NSTEMI s/p DES to dRCA 2007. b. Angina 07/2009: s/p DESx2 to mid RCA for ISR and prox RCA, EF 65%. c. Stress test 05/2013: low risk, no evidence of ischemia, EF 61%.   Dyslipidemia    GERD (gastroesophageal reflux disease)    History of kidney stones    Hypertension    Peripheral vascular disease (HCC)    Prostate cancer ([place])    a. s/p radical prostatectomy.   PVC's (premature ventricular contractions)    S/P mitral valve clip implantation 08/19/2020   s/p MitraClip implantation with a XTW by Dr. Burt Knack    Transient global amnesia    a. Adm 2009: imaging nonacute, had resolved.   Past Surgical History:  Procedure Laterality Date   CARDIAC CATHETERIZATION     COLONOSCOPY     CORONARY STENT INTERVENTION N/A 08/06/2020   Procedure: CORONARY STENT INTERVENTION;  Surgeon: Sherren Mocha, MD;  Location: [facility] [facility];  Service: Cardiovascular;  Laterality: N/A;   EYE SURGERY Bilateral    cataracts removed   MASTOIDECTOMY     L ear   MITRAL VALVE REPAIR     MITRAL VALVE REPAIR N/A 08/19/2020   Procedure: MITRAL VALVE REPAIR;  Surgeon: Sherren Mocha, MD;  Location: [facility];   Service: Cardiovascular;  Laterality: N/A;   PROSTATECTOMY     RIGHT/LEFT HEART CATH AND CORONARY ANGIOGRAPHY N/A 08/06/2020   Procedure: RIGHT/LEFT HEART CATH AND CORONARY ANGIOGRAPHY;  Surgeon: Sherren Mocha, MD;  Location: [place];  Service: Cardiovascular;  Laterality: N/A;   TEE WITHOUT CARDIOVERSION N/A 08/06/2020   Procedure: TRANSESOPHAGEAL ECHOCARDIOGRAM (TEE);  Surgeon: Sanda Klein, MD;  Location: [facility] ENDOSCOPY;  Service: Cardiovascular;  Laterality: N/A;   TEE WITHOUT CARDIOVERSION  08/19/2020   TEE WITHOUT CARDIOVERSION N/A 08/19/2020   Procedure: TRANSESOPHAGEAL ECHOCARDIOGRAM (TEE);  Surgeon: Sherren Mocha, MD;  Location: [place] [facility];  Service: Cardiovascular;  Laterality: N/A;   Patient Active Problem List   Diagnosis Date Noted   Acute ischemic left posterior cerebral artery (PCA) stroke ([place]) 04/07/2022   Acute ischemic stroke ([place]) 04/02/2022   Hypokalemia 04/02/2022   Fall at home, initial encounter 04/02/2022   Mitral valve disorder 01/16/2022   Paresthesia 01/16/2022   Exudative age-related macular degeneration of left eye with active choroidal neovascularization ([place]) 11/14/2021   Advanced nonexudative age-related macular degeneration of right eye without subfoveal involvement 11/14/2021   Advanced nonexudative age-related macular degeneration of left eye without subfoveal involvement 11/14/2021   S/P mitral valve clip  implantation 08/19/2020   Prostate cancer ([place])    PVC's (premature ventricular contractions)    Peripheral vascular disease (HCC)    Coronary artery disease    Nonrheumatic mitral valve regurgitation    Anemia in chronic kidney disease 04/24/2020   Stage 3b chronic kidney disease ([place]) 04/24/2020   History of malignant neoplasm of prostate 04/24/2020   Hypertensive renal disease 04/24/2020   Otorrhea of left ear 01/12/2020   Tympanic membrane perforation, left 01/12/2020   Acute non-recurrent sinusitis 08/07/2018   Tympanic  membrane perforation, marginal, right 08/07/2018   Cholesteatoma of attic of ear, left 01/23/2017   Conductive hearing loss of both ears 01/23/2017   Laryngopharyngeal reflux (LPR) 01/23/2017   Perennial allergic rhinitis 01/23/2017   Sensorineural hearing loss (SNHL), bilateral 08/17/2015   Bilateral chronic secretory otitis media 08/09/2015   Chronic swimmer's ear of both sides 08/09/2015   History of colonic polyps 08/09/2015   Dysfunction of both eustachian tubes 07/19/2015   Primary hypertension 02/27/2014   Hyperlipidemia 06/10/2013    ONSET DATE: 04-02-22  REFERRING DIAG:  Diagnosis  I63.9 (ICD-10-CM) - CVA (cerebral vascular accident) ([place])    THERAPY DIAG:  Unsteadiness on feet  Other abnormalities of gait and mobility  Muscle weakness (generalized)  Rationale for Evaluation and Treatment: Rehabilitation  SUBJECTIVE:                                                                                                                                                                                             SUBJECTIVE STATEMENT: Daugther, [name], reports that pt has used walker only one time (outside) of home since eval last week - is walking well in the home; states they have been out shopping and he uses the cart without any difficulty  - states he is doing better   Pt accompanied by: family member [name]  PERTINENT HISTORY: Brief HPI:   Adam Arellano is a 88 y.o. male  who was found down at his home my his neighbor on 04/02/2022. He presented to the [facility] ED with right-sided weakness, mild dysarthria and right hemianopsia on evaluation. Imaging was significant for left PICA infarct. No apparent injuries. Neurology consulted and resumed his Plavix and started aspirin 81 mg for 3 months then aspirin alone. Outside of window for tenecteplase and no evidence of LVO. Recommended resuming home statin; however, per PCP notes, the patient has stopped his home Crestor  due to "aches". Discharge Diagnoses:  Principal Problem:   Acute ischemic left posterior cerebral artery (PCA) stroke (HCC) Active Problems:   Primary hypertension   Stage 3b chronic kidney disease ([place]) Functional  deficits secondary to acute ischemic left posterior cerebral artery stroke Multiple skin tears Hyperlipidemia Coronary artery disease Prostate cancer Mitral regurgitation GERD Diplopia consipation        PAIN:  Are you having pain? No  PRECAUTIONS: Fall  WEIGHT BEARING RESTRICTIONS: No  FALLS: Has patient fallen in last 6 months? No  LIVING ENVIRONMENT: Lives with: lives with their daughter and husband Lives in: House/apartment Stairs: Yes: External: 6 steps; bilateral but cannot reach both; No steps at his house Has following equipment at home: Gilford Rile - 2 wheeled and Shower bench  PLOF: [place]: "Get back to being myself again"  OBJECTIVE: 05-16-22  NEURO RE-ED: Pt was instructed in HEP for balance and strengthening - Medbridge [id number] Pt performed sit to stand 10 reps from mat without UE support  Balance exercises at counter with UE support - cues to decrease UE support as able:  Pt performed forward, back and side kicks 10 reps each LE each direction; marching in place 10 reps; marching forwards, backwards along counter tops 2 reps Standing with feet together 30 secs (EO) without UE support with CGA to SBA Standing in partial tandem stance 15 secs each foot position with CGA SLS on each leg 10 secs with UE support on counter  Access Code: [email or web address] URL: [email or web address] Date: 05/16/2022 Prepared by: Ethelene Browns  Exercises - Standing March with Counter Support  - 1 x daily - 7 x weekly - 1 sets - 10 reps - Standing Hip Flexion with Counter Support  - 1 x daily - 7 x weekly - 1 sets - 10 reps - Standing Hip Extension with Unilateral Counter Support  - 1 x daily - 7 x weekly - 1 sets - 10 reps - Standing  Hip Abduction with Counter Support  - 1 x daily - 7 x weekly - 1 sets - 10 reps - Standing Romberg to 1/4 Tandem Stance  - 1 x daily - 7 x weekly - 1 sets - 1-2 reps - 15 secs hold - Romberg Stance  - 1 x daily - 7 x weekly - 1 sets - 1-2 reps - 15 sec hold hold - Single Leg Stance with Arms Out  - 1 x daily - 7 x weekly - 1 sets - 1-2 reps - 10 sec hold - Sit to Stand Without Arm Support  - 1 x daily - 7 x weekly - 1 sets - 10 reps  Pt performed touching balance bubbles (3) for targets to facilitate improved SLS on each leg - min assist for balance recovery Pt performed stepping over and back of balance beam 5 reps each leg with min assist for balance recovery Pt performed figure 8's around 5 cones to improve balance with turning - CGA  GAIT: Gait pattern: step through pattern, trunk flexed, and wide BOS Distance walked: 115' x 2 reps during session with cues for more upright posture Assistive device utilized: none  Level of assistance: CGA Comments: pt has slight Trendelenburg gait RLE & flexed trunk but daughter states his gait pattern is essentially the same now as it was prior to CVA  STAIRS: Level of Assistance: SBA Stair Negotiation Technique: Alternating Pattern  Forwards with Bilateral Rails Number of Stairs: 4  Height of Stairs: 6  Comments: Daughter states pt places both hands on rail at steps at her house - both with ascension and descension   THEREX: Heel raises 10 reps bil. LE's at counter with UE support Sit to stand -  see above SciFit level 5.0 x 3" with UE's & LE's:  decreased to level 4.0 for final 2" - pt used UE's & LE's   PATIENT EDUCATION: Education details: Medbridge [id number] Person educated: Patient and Child(ren) Education method: Explanation Education comprehension: verbalized understanding  HOME EXERCISE PROGRAM: To be established  GOALS: Goals reviewed with patient? Yes  SHORT TERM GOALS: Target date: 06-09-21  Improve Berg score to >/= 40/56  to demo improved balance and to decrease fall risk. Baseline: 35/56 Goal status: INITIAL  2.  Pt will improve TUG score without use of RW from 15.16 secs to </= 14 secs for reduced fall risk. Baseline: 15.16 secs Goal status: INITIAL  3.  Amb. Household distances modified independently without use of RW. Baseline:  Goal status: INITIAL  4.  Amb. 500' without device with CGA on flat even, uneven surfaces for increased community accessibility. Baseline:  Goal status: INITIAL  5.  Independent in HEP for balance and strengthening exercises.  Baseline:  Goal status: INITIAL   LONG TERM GOALS: Target date: 07-07-21  Improve FOTO score by at least 10 points to demo improvement in mobility. Baseline:  Goal status: INITIAL  Improve Berg score to >/= 46/56 to demo improved balance and to decrease fall risk. Baseline: 35/56 Goal status: INITIAL  3.   Pt will improve TUG score without use of RW from 15.16 secs to </= 12 secs for reduced fall risk. Baseline: 15.16 secs Goal status: INITIAL  4.   Amb. 800' without device with supervision on flat even, uneven surfaces for increased community accessibility. Baseline:  Goal status: INITIAL  5.  Pt will perform floor to stand transfer with UE support with SBA.  Baseline:  Goal status: INITIAL  6.  Independent in updated HEP for balance and strengthening exs.  Baseline:  Goal status: INITIAL   ASSESSMENT:  CLINICAL IMPRESSION: PT session focused on establishing HEP for balance and functional strengthening - daughter present for instruction and verbalized understanding.  Pt needs UE support for safety due to decreased SLS on each leg.  Pt exhibits gait deviations including WBOS with slight Trendelenburg RLE and forward flexed posture.  Daughter states gait pattern at present time is essentially the same as it was prior to CVA.  Rt visual field deficit impacted ability to touch target on Rt side with Rt foot initially- improvement noted  with practice and repetition.  Cont with POC.  OBJECTIVE IMPAIRMENTS: decreased activity tolerance, decreased balance, decreased knowledge of use of DME, decreased strength, and impaired vision/preception.   ACTIVITY LIMITATIONS: carrying, bending, standing, squatting, transfers, and locomotion level  PARTICIPATION LIMITATIONS: meal prep, cleaning, laundry, driving, shopping, community activity, and yard work  PERSONAL FACTORS: Behavior pattern and 1-2 comorbidities: diplopia and mild impulsivity  are also affecting patient's functional outcome.   REHAB POTENTIAL: Good  CLINICAL DECISION MAKING: Evolving/moderate complexity  EVALUATION COMPLEXITY: Moderate  PLAN:  PT FREQUENCY: 2x/week  PT DURATION: 8 weeks  PLANNED INTERVENTIONS: Therapeutic exercises, Therapeutic activity, Neuromuscular re-education, Balance training, Gait training, Patient/Family education, Self Care, Stair training, and DME instructions  PLAN FOR NEXT SESSION: check for any ?'s or problems with HEP issued on 05-16-22: dynamic gait without device, balance training,  functional strengthening   Khi Mcmillen, [place], PT 05/16/2022, 2:44 PM

## 2022-05-19 ENCOUNTER — Ambulatory Visit: Payer: Medicare Other | Attending: Cardiology | Admitting: Cardiology

## 2022-05-19 ENCOUNTER — Encounter: Payer: Self-pay | Admitting: Cardiology

## 2022-05-19 ENCOUNTER — Encounter: Payer: Self-pay | Admitting: Registered Nurse

## 2022-05-19 ENCOUNTER — Encounter: Payer: Self-pay | Admitting: Physical Therapy

## 2022-05-19 ENCOUNTER — Encounter: Payer: Medicare Other | Attending: Registered Nurse | Admitting: Registered Nurse

## 2022-05-19 ENCOUNTER — Ambulatory Visit: Payer: Medicare Other | Admitting: Physical Therapy

## 2022-05-19 ENCOUNTER — Ambulatory Visit: Payer: Medicare Other | Admitting: Speech Pathology

## 2022-05-19 VITALS — BP 153/87 | HR 64 | Ht 69.0 in | Wt 149.0 lb

## 2022-05-19 VITALS — BP 120/70 | HR 55 | Ht 69.0 in | Wt 149.0 lb

## 2022-05-19 VITALS — BP 157/75 | HR 53

## 2022-05-19 DIAGNOSIS — I63532 Cerebral infarction due to unspecified occlusion or stenosis of left posterior cerebral artery: Secondary | ICD-10-CM | POA: Insufficient documentation

## 2022-05-19 DIAGNOSIS — I639 Cerebral infarction, unspecified: Secondary | ICD-10-CM

## 2022-05-19 DIAGNOSIS — Z9889 Other specified postprocedural states: Secondary | ICD-10-CM | POA: Diagnosis not present

## 2022-05-19 DIAGNOSIS — I2510 Atherosclerotic heart disease of native coronary artery without angina pectoris: Secondary | ICD-10-CM

## 2022-05-19 DIAGNOSIS — R278 Other lack of coordination: Secondary | ICD-10-CM | POA: Diagnosis not present

## 2022-05-19 DIAGNOSIS — R2689 Other abnormalities of gait and mobility: Secondary | ICD-10-CM | POA: Diagnosis not present

## 2022-05-19 DIAGNOSIS — R2681 Unsteadiness on feet: Secondary | ICD-10-CM

## 2022-05-19 DIAGNOSIS — R4701 Aphasia: Secondary | ICD-10-CM

## 2022-05-19 DIAGNOSIS — Z95818 Presence of other cardiac implants and grafts: Secondary | ICD-10-CM | POA: Diagnosis not present

## 2022-05-19 DIAGNOSIS — I10 Essential (primary) hypertension: Secondary | ICD-10-CM | POA: Diagnosis not present

## 2022-05-19 DIAGNOSIS — R41841 Cognitive communication deficit: Secondary | ICD-10-CM

## 2022-05-19 DIAGNOSIS — M6281 Muscle weakness (generalized): Secondary | ICD-10-CM | POA: Diagnosis not present

## 2022-05-19 DIAGNOSIS — I69351 Hemiplegia and hemiparesis following cerebral infarction affecting right dominant side: Secondary | ICD-10-CM | POA: Diagnosis not present

## 2022-05-19 DIAGNOSIS — I69318 Other symptoms and signs involving cognitive functions following cerebral infarction: Secondary | ICD-10-CM | POA: Diagnosis not present

## 2022-05-19 MED ORDER — FUROSEMIDE 20 MG PO TABS: 20.0000 mg | ORAL_TABLET | Freq: Every day | ORAL | 3 refills | Status: AC

## 2022-05-19 MED ORDER — CLOPIDOGREL BISULFATE 75 MG PO TABS: 75.0000 mg | ORAL_TABLET | Freq: Every day | ORAL | 3 refills | Status: DC

## 2022-05-19 NOTE — Progress Notes (Signed)
Cardiology Office Note:    Date:  05/19/2022   ID:  Adam Arellano, DOB 08/30/1931, MRN [id number]  PCP:  Corliss Blacker, MD  [facility] Cardiologist:  Candee Furbish, MD  [facility] Electrophysiologist:  None   Referring MD: Lajean Manes, MD    History of Present Illness:    Adam Arellano is a 88 y.o. male here for the follow-up of MitraClip March 2022, right coronary artery stenting March 2022.  Unfortunately suffered a stroke in November 2023.  He was placed back on his aspirin 81 mg with his Plavix 75.  He has a Preventice monitor looking for atrial fibrillation currently.  Has hypertension hyperlipidemia and prior PVCs noted as well.  His daughter is a Marine scientist, Chief Executive Officer.  She is helpful with historical items.   Past Medical History:  Diagnosis Date   Asthma    a. Childhood.   Coronary artery disease    a. NSTEMI s/p DES to dRCA 2007. b. Angina 07/2009: s/p DESx2 to mid RCA for ISR and prox RCA, EF 65%. c. Stress test 05/2013: low risk, no evidence of ischemia, EF 61%.   Dyslipidemia    GERD (gastroesophageal reflux disease)    History of kidney stones    Hypertension    Peripheral vascular disease (HCC)    Prostate cancer ([place])    a. s/p radical prostatectomy.   PVC's (premature ventricular contractions)    S/P mitral valve clip implantation 08/19/2020   s/p MitraClip implantation with a XTW by Dr. Burt Knack    Transient global amnesia    a. Adm 2009: imaging nonacute, had resolved.    Past Surgical History:  Procedure Laterality Date   CARDIAC CATHETERIZATION     COLONOSCOPY     CORONARY STENT INTERVENTION N/A 08/06/2020   Procedure: CORONARY STENT INTERVENTION;  Surgeon: Sherren Mocha, MD;  Location: [place] [facility];  Service: Cardiovascular;  Laterality: N/A;   EYE SURGERY Bilateral    cataracts removed   MASTOIDECTOMY     L ear   MITRAL VALVE REPAIR     MITRAL VALVE REPAIR N/A 08/19/2020   Procedure: MITRAL VALVE REPAIR;  Surgeon: Sherren Mocha, MD;  Location: [place] [facility];  Service: Cardiovascular;  Laterality: N/A;   PROSTATECTOMY     RIGHT/LEFT HEART CATH AND CORONARY ANGIOGRAPHY N/A 08/06/2020   Procedure: RIGHT/LEFT HEART CATH AND CORONARY ANGIOGRAPHY;  Surgeon: Sherren Mocha, MD;  Location: [place] [facility];  Service: Cardiovascular;  Laterality: N/A;   TEE WITHOUT CARDIOVERSION N/A 08/06/2020   Procedure: TRANSESOPHAGEAL ECHOCARDIOGRAM (TEE);  Surgeon: Sanda Klein, MD;  Location: [facility];  Service: Cardiovascular;  Laterality: N/A;   TEE WITHOUT CARDIOVERSION  08/19/2020   TEE WITHOUT CARDIOVERSION N/A 08/19/2020   Procedure: TRANSESOPHAGEAL ECHOCARDIOGRAM (TEE);  Surgeon: Sherren Mocha, MD;  Location: [place] [facility];  Service: Cardiovascular;  Laterality: N/A;    Current Medications: Current Meds  Medication Sig   acetaminophen (TYLENOL) 325 MG tablet Take 1-2 tablets (325-650 mg total) by mouth every 4 (four) hours as needed for mild pain.   aspirin EC 81 MG tablet Take 81 mg by mouth daily. Swallow whole.   ciprofloxacin-dexamethasone (CIPRODEX) OTIC suspension Place 4 drops into the left ear 2 (two) times daily as needed (ear infection).   docusate sodium (COLACE) 100 MG capsule Take 1 capsule (100 mg total) by mouth 2 (two) times daily.   FIBER PO Take 1 capsule by mouth daily.   furosemide (LASIX) 20 MG tablet Take  1 tablet (20 mg total) by mouth daily.   gabapentin (NEURONTIN) 100 MG capsule Take 1 capsule (100 mg total) by mouth at bedtime.   losartan (COZAAR) 25 MG tablet TAKE 1 TABLET DAILY   Menthol-Methyl Salicylate (MUSCLE RUB) 10-15 % CREA Apply 1 Application topically 2 (two) times daily as needed for muscle pain.   metoprolol tartrate (LOPRESSOR) 25 MG tablet Take 25 mg by mouth 2 (two) times daily.   Multiple Vitamin (MULTIVITAMIN WITH MINERALS) TABS tablet Take 1 tablet by mouth daily.   nitroGLYCERIN (NITROSTAT) 0.4 MG SL tablet PLACE 1 TABLET UNDER THE TONGUE AT ONSET OF CHEST  PAIN EVERY 5 MINTUES UP TO 3 TIMES AS NEEDED   polyethylene glycol (MIRALAX / GLYCOLAX) 17 g packet Take 17 g by mouth daily as needed for mild constipation.   Polyvinyl Alcohol-Povidone (REFRESH OP) Place 1 drop into both eyes daily as needed (dry eye).   [DISCONTINUED] clopidogrel (PLAVIX) 75 MG tablet Take 1 tablet (75 mg total) by mouth daily.     Allergies:   Simvastatin, Atorvastatin, Lisinopril, and Rosuvastatin   Social History   Socioeconomic History   Marital status: Married    Spouse name: Not on file   Number of children: Not on file   Years of education: Not on file   Highest education level: Not on file  Occupational History   Not on file  Tobacco Use   Smoking status: Former    Types: Cigarettes    Quit date: 1980    Years since quitting: 43.9   Smokeless tobacco: Never   Tobacco comments:    Smoked for 15 years  Vaping Use   Vaping Use: Never used  Substance and Sexual Activity   Alcohol use: Not Currently    Comment: rare   Drug use: No   Sexual activity: Not on file  Other Topics Concern   Not on file  Social History Narrative   Not on file   Social Determinants of Health   Financial Resource Strain: Not on file  Food Insecurity: No Food Insecurity (04/03/2022)   Hunger Vital Sign    Worried About Running Out of Food in the Last Year: Never true    Ran Out of Food in the Last Year: Never true  Transportation Needs: No Transportation Needs (04/03/2022)   PRAPARE - Hydrologist (Medical): No    Lack of Transportation (Non-Medical): No  Physical Activity: Not on file  Stress: Not on file  Social Connections: Not on file     Family History: The patient's family history is negative for CAD.  ROS:   Please see the history of present illness. (+) Shooting pains in left UE (+) Fatigue All other systems reviewed and are negative.  EKGs/Labs/Other Studies Reviewed:    The following studies were reviewed  today:  ECHO 04/03/22:  1. Left ventricular ejection fraction, by estimation, is 55 to 60%. The  left ventricle has normal function. The left ventricle has no regional  wall motion abnormalities. There is mild left ventricular hypertrophy.  Left ventricular diastolic parameters  are consistent with Grade I diastolic dysfunction (impaired relaxation).   2. Right ventricular systolic function is normal. The right ventricular  size is normal. There is mildly elevated pulmonary artery systolic  pressure. The estimated right ventricular systolic pressure is 11.9 mmHg.   3. Left atrial size was mildly dilated.   4. The mitral valve has been repaired/replaced. Mild mitral valve  regurgitation. The  mean mitral valve gradient is 3.0 mmHg. There is a  Mitra-Clip present in the mitral position.   5. Tricuspid valve regurgitation is mild to moderate.   6. The aortic valve is tricuspid. Aortic valve regurgitation is not  visualized. Aortic valve sclerosis/calcification is present, without any  evidence of aortic stenosis. Aortic valve mean gradient measures 5.5 mmHg.   7. The inferior vena cava is normal in size with greater than 50%  respiratory variability, suggesting right atrial pressure of 3 mmHg.   Comparison(s): Prior images reviewed side by side. LVEF remains normal at  55-60%. MitraClip in position with mild posterior leaflet prolapse, mild  mitral regurgitation and stable mean gradient.    Mitral Valve Repair 08/19/2020: Successful transcatheter edge-to-edge mitral valve repair using a MitraClip XTW device, reducing 4+ mitral regurgitation to 1+, clip positioned A2/P2  RHC/LHC 08/06/2020: 1.  Patent left mainstem with mild nonobstructive plaquing 2.  Patent LAD with mild diffuse calcific nonobstructive stenosis 3.  Patent circumflex with mild nonobstructive stenosis 4.  Severe stenosis of a moderate caliber ramus intermedius branch, favor medical therapy and an absence of angina 5.   Severe stenosis of a large, dominant RCA, successfully treated with PTCA and stenting using a 3.5 x 22 mm resolute Onyx DES deployed in overlapping fashion with a previously implanted distal RCA stent 6.  Normal LVEDP 7.  Hemodynamics consistent with severe mitral regurgitation with a pulmonary capillary wedge pressure V wave of 45 mmHg, mean wedge pressure 24 mmHg, mean PA pressure 29 mmHg   Recommendations: Aspirin and clopidogrel without interruption x6 months as tolerated Cardiac surgical consultation for evaluation of severe mitral regurgitation, consideration of transcatheter edge-to-edge mitral valve repair in this elderly patient Medical therapy for residual CAD  EKG:     06/15/2020: sinus rhythm 67, PVC.  Recent Labs: 11/02/2021: NT-Pro BNP 1,085 04/07/2022: ALT 16 04/17/2022: Hemoglobin 12.2; Platelets 307 04/20/2022: BUN 30; Creatinine, Ser 1.81; Potassium 4.2; Sodium 136   Recent Lipid Panel    Component Value Date/Time   CHOL 123 04/03/2022 0615   TRIG 38 04/03/2022 0615   HDL 46 04/03/2022 0615   CHOLHDL 2.7 04/03/2022 0615   VLDL 8 04/03/2022 0615   LDLCALC 69 04/03/2022 0615     Physical Exam:    VS:  BP 120/70 (BP Location: Left Arm, Patient Position: Sitting, Cuff Size: Normal)   Pulse (!) 55   Ht '5\' 9"'$  (1.753 m)   Wt 149 lb (67.6 kg)   SpO2 95%   BMI 22.00 kg/m     Wt Readings from Last 3 Encounters:  05/19/22 149 lb (67.6 kg)  04/10/22 153 lb (69.4 kg)  04/03/22 150 lb 14.4 oz (68.4 kg)     GEN: Well nourished, well developed, in no acute distress HEENT: normal Neck: no JVD, carotid bruits, or masses Cardiac: RRR; no murmurs, rubs, or gallops,no edema  Respiratory:  clear to auscultation bilaterally, normal work of breathing GI: soft, nontender, nondistended, + BS MS: no deformity or atrophy Skin: warm and dry, no rash Neuro:  Alert and Oriented x 3, Strength and sensation are intact mild memory impairment Psych: euthymic mood, full  affect     ASSESSMENT:    1. Cerebrovascular accident (CVA), unspecified mechanism ([place])   2. S/P mitral valve clip implantation   3. CAD in native artery      PLAN:    In order of problems listed above:  Acute ischemic left posterior cerebral artery (PCA) stroke  Currently on both  aspirin 81 mg as well as Plavix 75 mg.  They are seeing Dr. Leonie Man soon.  Memory has diminished.  Hospital notes lab work data extensive review.  Ambulating well.  Dysarthria resolved He is currently wearing a Preventice monitor.   Nonrheumatic mitral valve regurgitation Successful MitraClip procedure 2022.  No residual murmur heard on exam.  1+ post MI.  Excellent.  Continue with Plavix.   No significant shortness of breath.  Prophylaxis.  Essential hypertension Currently well controlled on low-dose metoprolol and low-dose losartan.  No changes made.  Continue with current medical management.  Restarting Lasix 20 mg once a day.  Minor lower extremity edema noted.  Most recent creatinine 1.6 from outside labs.  Stable.  Hyperlipidemia Encourage statin use previously however he is no longer taking his Crestor.  He stopped his simvastatin previously because of aches.  He does not wish to be on statin therapy.  Continue with diet and exercise.  Last LDL 42 HDL 42.  Excellent.  Coronary artery disease Stent placement to RCA prior to MitraClip.  Overall doing well.  No chest pain.    S/P mitral valve clip implantation Doing well     Follow-up: 1 year.     Medication Adjustments/Labs and Tests Ordered: Current medicines are reviewed at length with the patient today.  Concerns regarding medicines are outlined above.   No orders of the defined types were placed in this encounter.   Meds ordered this encounter  Medications   clopidogrel (PLAVIX) 75 MG tablet    Sig: Take 1 tablet (75 mg total) by mouth daily.    Dispense:  90 tablet    Refill:  3   furosemide (LASIX) 20 MG tablet    Sig: Take 1  tablet (20 mg total) by mouth daily.    Dispense:  90 tablet    Refill:  3    Patient Instructions  Medication Instructions:  Please restart Furosemide 20 mg a day. Continue all other medications as listed.  *If you need a refill on your cardiac medications before your next appointment, please call your pharmacy*  Follow-Up: At [facility], you and your health needs are our priority.  As part of our continuing mission to provide you with exceptional heart care, we have created designated Provider Care Teams.  These Care Teams include your primary Cardiologist (physician) and Advanced Practice Providers (APPs -  Physician Assistants and Nurse Practitioners) who all work together to provide you with the care you need, when you need it.  We recommend signing up for the patient portal called "MyChart".  Sign up information is provided on this After Visit Summary.  MyChart is used to connect with patients for Virtual Visits (Telemedicine).  Patients are able to view lab/test results, encounter notes, upcoming appointments, etc.  Non-urgent messages can be sent to your provider as well.   To learn more about what you can do with MyChart, go to [email or web address].    Your next appointment:   6 month(s)  The format for your next appointment:   In Person  Provider:   Candee Furbish, MD      Important Information About Sugar          Signed, Candee Furbish, MD  05/19/2022 10:36 AM    [place]

## 2022-05-19 NOTE — Therapy (Signed)
OUTPATIENT PHYSICAL THERAPY NEURO TREATMENT   Patient Name: Adam Arellano MRN: [id number] DOB:[birth year], 88 y.o., male Today's Date: 05/19/2022  PCP: Adam Manes, MD REFERRING PROVIDER: Cathlyn Parsons, PA-C  END OF SESSION:  PT End of Session - 05/19/22 0819     Visit Number 3    Number of Visits 17    Date for PT Re-Evaluation 07/07/22    Authorization Type [employer] Medicare    Authorization Time Period 05-09-22 - 07-07-22    PT Start Time 0847    PT Stop Time 0930    PT Time Calculation (min) 43 min    Equipment Utilized During Treatment Gait belt    Activity Tolerance Patient tolerated treatment well    Behavior During Therapy WFL for tasks assessed/performed              Past Medical History:  Diagnosis Date   Asthma    a. Childhood.   Coronary artery disease    a. NSTEMI s/p DES to dRCA 2007. b. Angina 07/2009: s/p DESx2 to mid RCA for ISR and prox RCA, EF 65%. c. Stress test 05/2013: low risk, no evidence of ischemia, EF 61%.   Dyslipidemia    GERD (gastroesophageal reflux disease)    History of kidney stones    Hypertension    Peripheral vascular disease (HCC)    Prostate cancer ([place])    a. s/p radical prostatectomy.   PVC's (premature ventricular contractions)    S/P mitral valve clip implantation 08/19/2020   s/p MitraClip implantation with a XTW by Dr. Burt Knack    Transient global amnesia    a. Adm 2009: imaging nonacute, had resolved.   Past Surgical History:  Procedure Laterality Date   CARDIAC CATHETERIZATION     COLONOSCOPY     CORONARY STENT INTERVENTION N/A 08/06/2020   Procedure: CORONARY STENT INTERVENTION;  Surgeon: Sherren Mocha, MD;  Location: [place] [facility];  Service: Cardiovascular;  Laterality: N/A;   EYE SURGERY Bilateral    cataracts removed   MASTOIDECTOMY     L ear   MITRAL VALVE REPAIR     MITRAL VALVE REPAIR N/A 08/19/2020   Procedure: MITRAL VALVE REPAIR;  Surgeon: Sherren Mocha, MD;  Location: [place] [facility];   Service: Cardiovascular;  Laterality: N/A;   PROSTATECTOMY     RIGHT/LEFT HEART CATH AND CORONARY ANGIOGRAPHY N/A 08/06/2020   Procedure: RIGHT/LEFT HEART CATH AND CORONARY ANGIOGRAPHY;  Surgeon: Sherren Mocha, MD;  Location: [place] [facility];  Service: Cardiovascular;  Laterality: N/A;   TEE WITHOUT CARDIOVERSION N/A 08/06/2020   Procedure: TRANSESOPHAGEAL ECHOCARDIOGRAM (TEE);  Surgeon: Sanda Klein, MD;  Location: [facility];  Service: Cardiovascular;  Laterality: N/A;   TEE WITHOUT CARDIOVERSION  08/19/2020   TEE WITHOUT CARDIOVERSION N/A 08/19/2020   Procedure: TRANSESOPHAGEAL ECHOCARDIOGRAM (TEE);  Surgeon: Sherren Mocha, MD;  Location: [place] [facility];  Service: Cardiovascular;  Laterality: N/A;   Patient Active Problem List   Diagnosis Date Noted   Acute ischemic left posterior cerebral artery (PCA) stroke ([place]) 04/07/2022   Acute ischemic stroke ([place]) 04/02/2022   Hypokalemia 04/02/2022   Fall at home, initial encounter 04/02/2022   Mitral valve disorder 01/16/2022   Paresthesia 01/16/2022   Exudative age-related macular degeneration of left eye with active choroidal neovascularization ([place]) 11/14/2021   Advanced nonexudative age-related macular degeneration of right eye without subfoveal involvement 11/14/2021   Advanced nonexudative age-related macular degeneration of left eye without subfoveal involvement 11/14/2021   S/P mitral valve clip implantation  08/19/2020   Prostate cancer ([place])    PVC's (premature ventricular contractions)    Peripheral vascular disease (HCC)    Coronary artery disease    Nonrheumatic mitral valve regurgitation    Anemia in chronic kidney disease 04/24/2020   Stage 3b chronic kidney disease ([place]) 04/24/2020   History of malignant neoplasm of prostate 04/24/2020   Hypertensive renal disease 04/24/2020   Otorrhea of left ear 01/12/2020   Tympanic membrane perforation, left 01/12/2020   Acute non-recurrent sinusitis 08/07/2018   Tympanic  membrane perforation, marginal, right 08/07/2018   Cholesteatoma of attic of ear, left 01/23/2017   Conductive hearing loss of both ears 01/23/2017   Laryngopharyngeal reflux (LPR) 01/23/2017   Perennial allergic rhinitis 01/23/2017   Sensorineural hearing loss (SNHL), bilateral 08/17/2015   Bilateral chronic secretory otitis media 08/09/2015   Chronic swimmer's ear of both sides 08/09/2015   History of colonic polyps 08/09/2015   Dysfunction of both eustachian tubes 07/19/2015   Primary hypertension 02/27/2014   Hyperlipidemia 06/10/2013    ONSET DATE: 04-02-22  REFERRING DIAG:  Diagnosis  I63.9 (ICD-10-CM) - CVA (cerebral vascular accident) ([place])    THERAPY DIAG:  Unsteadiness on feet  Other abnormalities of gait and mobility  Muscle weakness (generalized)  Other lack of coordination  Rationale for Evaluation and Treatment: Rehabilitation  SUBJECTIVE:                                                                                                                                                                                             SUBJECTIVE STATEMENT: Patient reports that he is doing well. Denies falls since last visit. Patient reports that his walking is going well. He is walking without walker in all settings; daughter or daughter's husband are with patient at all times. Per daughter, Adam Arellano, patient was not able to get through all of HEP but did make it through a few exercises. Balance is primary concern for daughter. Patient's   Pt accompanied by: family member Adam Arellano  PERTINENT HISTORY: Brief HPI:   Adam Arellano is a 88 y.o. male  who was found down at his home my his neighbor on 04/02/2022. [facility] ED with right-sided weakness, mild dysarthria and right hemianopsia on evaluation. Imaging was significant for left PICA infarct. No apparent injuries. Neurology consulted and resumed his Plavix and started aspirin 81 mg for 3 months then  aspirin alone. Outside of window for tenecteplase and no evidence of LVO. Recommended resuming home statin; however, per PCP notes, the patient has stopped his home Crestor due to "aches".   Discharge Diagnoses:  Principal Problem:  Acute ischemic left posterior cerebral artery (PCA) stroke (HCC) Active Problems:   Primary hypertension   Stage 3b chronic kidney disease (HCC) Functional deficits secondary to acute ischemic left posterior cerebral artery stroke Multiple skin tears Hyperlipidemia Coronary artery disease Prostate cancer Mitral regurgitation GERD Diplopia consipation     PAIN:  Are you having pain? No  PRECAUTIONS: Fall  PATIENT GOALS: "Get back to being myself again"  OBJECTIVE:   Today's Vitals   05/19/22 0857  BP: (!) 157/75  Pulse: (!) 53   There is no height or weight on file to calculate BMI.  **All activities performed with gait belt   Ther Act. Step forward and back with yellow ball on racket balance in RUE x 6 for functional balance while holding objects for error augmentation (successful 3/7 trials w/ CGA)  Stairs with use of bilateral UE 2 x 4 steps - mild deficits noted on foot placement, cues for reducing speed to improve safety (SBA-CGA)  Education on alignment to chair before going to sit for improved safety  NRM: Navigation through obstacles without RW + SBA-CGA with emphasis on visual safety for dynamic safety 1 x 115', 2 x 30'  6 x 6" hurdle step overs- step to gait pattern  Error with foot placement/knocking over hurdles with 5/6 trials (CGA-minA)  Sit to stand w/o UE on foam (thigh assist intermittently) 3 x with 1 x 5" hold EC, 3 x with 1 x 10" EC hold, 3 x 1 x 15" EC  Bubble dot semicircle taps to called out color 3 x 10 (CGA)  Lateral stepping to bubble dots 6 x7' (CGA)  PATIENT EDUCATION: Education details: Visual merchandiser, AD recommendation/safety Person educated: Patient and Child(ren) Education  method: Explanation Education comprehension: verbalized understanding  HOME EXERCISE PROGRAM: Access Code: [email or web address] URL: [email or web address] Date: 05/16/2022 Prepared by: Ethelene Browns  Exercises - Standing March with Counter Support  - 1 x daily - 7 x weekly - 1 sets - 10 reps - Standing Hip Flexion with Counter Support  - 1 x daily - 7 x weekly - 1 sets - 10 reps - Standing Hip Extension with Unilateral Counter Support  - 1 x daily - 7 x weekly - 1 sets - 10 reps - Standing Hip Abduction with Counter Support  - 1 x daily - 7 x weekly - 1 sets - 10 reps - Standing Romberg to 1/4 Tandem Stance  - 1 x daily - 7 x weekly - 1 sets - 1-2 reps - 15 secs hold - Romberg Stance  - 1 x daily - 7 x weekly - 1 sets - 1-2 reps - 15 sec hold hold - Single Leg Stance with Arms Out  - 1 x daily - 7 x weekly - 1 sets - 1-2 reps - 10 sec hold - Sit to Stand Without Arm Support  - 1 x daily - 7 x weekly - 1 sets - 10 reps   GOALS: Goals reviewed with patient? Yes  SHORT TERM GOALS: Target date: 06-09-21  Improve Berg score to >/= 40/56 to demo improved balance and to decrease fall risk. Baseline: 35/56 Goal status: INITIAL  2.  Pt will improve TUG score without use of RW from 15.16 secs to </= 14 secs for reduced fall risk. Baseline: 15.16 secs Goal status: INITIAL  3.  Amb. Household distances modified independently without use of RW. Baseline:  Goal status: INITIAL  4.  Amb. 500' without device with CGA on flat even, uneven  surfaces for increased community accessibility. Baseline:  Goal status: INITIAL  5.  Independent in HEP for balance and strengthening exercises.  Baseline:  Goal status: INITIAL   LONG TERM GOALS: Target date: 07-07-21  Improve FOTO score by at least 10 points to demo improvement in mobility. Baseline:  Goal status: INITIAL  Improve Berg score to >/= 46/56 to demo improved balance and to decrease fall risk. Baseline: 35/56 Goal status:  INITIAL  3.   Pt will improve TUG score without use of RW from 15.16 secs to </= 12 secs for reduced fall risk. Baseline: 15.16 secs Goal status: INITIAL  4.   Amb. 800' without device with supervision on flat even, uneven surfaces for increased community accessibility. Baseline:  Goal status: INITIAL  5.  Pt will perform floor to stand transfer with UE support with SBA.  Baseline:  Goal status: INITIAL  6.  Independent in updated HEP for balance and strengthening exs.  Baseline:  Goal status: INITIAL   ASSESSMENT:  CLINICAL IMPRESSION: PT session focused on functional dynamic balance, stair navigation, modified SLS activities, and error augmentation tasks. Pt continues to exhibit gait deviations including WBOS with slight Trendelenburg RLE and forward flexed posture. Improved upright posture with cueing. Patient demonstrates poor coordination and slight ataxic movement with bilateral LE during error augmentation tasks with hurdle taps and step overs. Patient educated on use of walker for uneven terrain given deficits. Recommend trial of SPC or other AD at next session. Continue with POC.  OBJECTIVE IMPAIRMENTS: decreased activity tolerance, decreased balance, decreased knowledge of use of DME, decreased strength, and impaired vision/preception.   ACTIVITY LIMITATIONS: carrying, bending, standing, squatting, transfers, and locomotion level  PARTICIPATION LIMITATIONS: meal prep, cleaning, laundry, driving, shopping, community activity, and yard work  PERSONAL FACTORS: Behavior pattern and 1-2 comorbidities: diplopia and mild impulsivity  are also affecting patient's functional outcome.   REHAB POTENTIAL: Good  CLINICAL DECISION MAKING: Evolving/moderate complexity  EVALUATION COMPLEXITY: Moderate  PLAN:  PT FREQUENCY: 2x/week  PT DURATION: 8 weeks  PLANNED INTERVENTIONS: Therapeutic exercises, Therapeutic activity, Neuromuscular re-education, Balance training, Gait  training, Patient/Family education, Self Care, Stair training, and DME instructions  PLAN FOR NEXT SESSION: issued on 05-16-22: dynamic gait without device, balance training,  functional strengthening, coordination, ankle weights for input, trial SPC or other AD for gait over uneven terrain  Esperanza Heir, PT 05/19/2022, 9:56 AM

## 2022-05-19 NOTE — Patient Instructions (Signed)
Medication Instructions:  Please restart Furosemide 20 mg a day. Continue all other medications as listed.  *If you need a refill on your cardiac medications before your next appointment, please call your pharmacy*  Follow-Up: At [facility], you and your health needs are our priority.  As part of our continuing mission to provide you with exceptional heart care, we have created designated Provider Care Teams.  These Care Teams include your primary Cardiologist (physician) and Advanced Practice Providers (APPs -  Physician Assistants and Nurse Practitioners) who all work together to provide you with the care you need, when you need it.  We recommend signing up for the patient portal called "MyChart".  Sign up information is provided on this After Visit Summary.  MyChart is used to connect with patients for Virtual Visits (Telemedicine).  Patients are able to view lab/test results, encounter notes, upcoming appointments, etc.  Non-urgent messages can be sent to your provider as well.   To learn more about what you can do with MyChart, go to [email or web address].    Your next appointment:   6 month(s)  The format for your next appointment:   In Person  Provider:   Candee Furbish, MD      Important Information About Sugar

## 2022-05-19 NOTE — Patient Instructions (Addendum)
Medication:  Create a routine to aid in increased independence   Take at same time every day and pair with something else you do every day  Morning meds first thing in the morning. Keep pill box somewhere obvious. Evening meds before taking care of nightly oral care.    [name], begin establishing the routine and transition to just cueing when routine is established. "What else do you need to do?"   Describing words  What group does it belong to?  What do I use it for?  Where can I find it?  What does it LOOK like?  What other words go with it?  What is the 1st sound of the word?   Many Ways to Communicate  Describe it Write it Draw it Gesture it Use related words

## 2022-05-19 NOTE — Progress Notes (Unsigned)
Subjective:    Patient ID: Adam Arellano, male    DOB: [birth year], 88 y.o.   MRN: [id number]  HPI: Adam Arellano is a 88 y.o. male who is here for [place] appointment for follow up of his Acute Ischemic left posterior cerebral artery stroke and Essential Hypertension. He presented to [facility] on 04/02/2022  H&P: Dr Nanda Quinton: 04/02/2022  HPI: Adam Arellano is a 88 y.o. male with medical history significant of history of coronary artery disease, hyperlipidemia, GERD, hypertension, status post mitral valve clip implant, and more presents to the ED with a chief complaint of altered mental status.  Is reported the daughter spoke to patient last yesterday around 4 PM and he was normal at that time.  Patient reports that he was normal at the time that he went to bed.  Reports he was also normal when he got up this morning.  He reports that he remembers eating breakfast and felt that he was in his normal state of health at that time as well.,  And then daughter was trying to get a hold of him later in the day and could not reach him.  She sent somebody to check on him and he was found in his bed saturated in urine, with bruises and blood around, everything knocked off the nightstand, and with slurred speech.  Patient reports he does not remember that.  He does not remember falling.  Reports he is not in any pain right now.  He does not remember having a headache, dizziness, chest pain, palpitations, dyspnea.  Daughter reports at baseline he has clear speech, lives independently, takes care of his own ADLs.  He has no history of tremor, although there is a right upper extremity tremor at this time.  He has no history of contracture although he is holding his right upper extremity in a fist, unless he really focuses on opening his hand.  Patient is no longer slurred, but it is reported that it was slurred with he was found.  He also had incoherent speech where the words were not making sense when he was  found.  Patient is not able to contribute any more to this history.  Of note, he does have a mitral valve clip and compatibility with MRI is unknown.  CT Head: WO Contrast: CT Cervical Spine   IMPRESSION: 1. Moderate volume hyperacute to acute infarct in the left posterior cerebral artery territory without significant associated mass effect. 2. No acute facial bone fracture. 3. No acute osseous injury in the cervical spine.  MRI: MRA:  IMPRESSION: MRI:   Acute left PCA territory infarct.   MRA:   1. Occlusion (favored) versus severe stenosis of the left P2 PCA. 2. Potentially severe proximal right A2 ACA stenosis. 3. Approximately 3 mm outpouching arising from the left MCA bifurcation, concerning for aneurysm. Recommend CTA to confirm and further characterize.  Neurology was consulted: : his Plavix was resumed and Aspirin 81 mg for three months, then aspirin alone.   He was admitted to inpatient rehabilitation on 04/07/2022 and discharged to daughter's home on 04/21/2022. He is receiving Outpatient therapy at [facility]. He denies any pain. He rates his pain 0, Also reports he has a good appetite,   His daughter in room. Daughter reports he has a chronic cough, she will speak with his cardiologist regarding the Losartan, she verbalizes understanding.    Pain Inventory Average Pain 0 Pain Right Now 0 My pain is  no pain  LOCATION OF PAIN  no pain  BOWEL Number of stools per week: 2-3 Oral laxative use Yes  Type of laxative colace   BLADDER Pads  Bladder incontinence Yes  Frequent urination No  Leakage with coughing Yes  Difficulty starting stream No  Incomplete bladder emptying No    Mobility walk with assistance use a walker ability to climb steps?  yes Do you have any goals in this area?  yes  Function retired I need assistance with the following:  meal prep, household duties, and shopping Do you have any goals in this area?   yes  Neuro/Psych trouble walking confusion anxiety  Prior Studies Any changes since last visit?  no  Physicians involved in your care Any changes since last visit?  no   Family History  Problem Relation Age of Onset   CAD Neg Hx    Social History   Socioeconomic History   Marital status: Married    Spouse name: Not on file   Number of children: Not on file   Years of education: Not on file   Highest education level: Not on file  Occupational History   Not on file  Tobacco Use   Smoking status: Former    Types: Cigarettes    Quit date: 1980    Years since quitting: 43.9   Smokeless tobacco: Never   Tobacco comments:    Smoked for 15 years  Vaping Use   Vaping Use: Never used  Substance and Sexual Activity   Alcohol use: Not Currently    Comment: rare   Drug use: No   Sexual activity: Not on file  Other Topics Concern   Not on file  Social History Narrative   Not on file   Social Determinants of Health   Financial Resource Strain: Not on file  Food Insecurity: No Food Insecurity (04/03/2022)   Hunger Vital Sign    Worried About Running Out of Food in the Last Year: Never true    Ran Out of Food in the Last Year: Never true  Transportation Needs: No Transportation Needs (04/03/2022)   PRAPARE - Hydrologist (Medical): No    Lack of Transportation (Non-Medical): No  Physical Activity: Not on file  Stress: Not on file  Social Connections: Not on file   Past Surgical History:  Procedure Laterality Date   CARDIAC CATHETERIZATION     COLONOSCOPY     CORONARY STENT INTERVENTION N/A 08/06/2020   Procedure: CORONARY STENT INTERVENTION;  Surgeon: Sherren Mocha, MD;  Location: [place] [facility];  Service: Cardiovascular;  Laterality: N/A;   EYE SURGERY Bilateral    cataracts removed   MASTOIDECTOMY     L ear   MITRAL VALVE REPAIR     MITRAL VALVE REPAIR N/A 08/19/2020   Procedure: MITRAL VALVE REPAIR;  Surgeon: Sherren Mocha,  MD;  Location: [place] [facility];  Service: Cardiovascular;  Laterality: N/A;   PROSTATECTOMY     RIGHT/LEFT HEART CATH AND CORONARY ANGIOGRAPHY N/A 08/06/2020   Procedure: RIGHT/LEFT HEART CATH AND CORONARY ANGIOGRAPHY;  Surgeon: Sherren Mocha, MD;  Location: [place] [facility];  Service: Cardiovascular;  Laterality: N/A;   TEE WITHOUT CARDIOVERSION N/A 08/06/2020   Procedure: TRANSESOPHAGEAL ECHOCARDIOGRAM (TEE);  Surgeon: Sanda Klein, MD;  Location: [facility] ENDOSCOPY;  Service: Cardiovascular;  Laterality: N/A;   TEE WITHOUT CARDIOVERSION  08/19/2020   TEE WITHOUT CARDIOVERSION N/A 08/19/2020   Procedure: TRANSESOPHAGEAL ECHOCARDIOGRAM (TEE);  Surgeon: Sherren Mocha, MD;  Location:  [place] [facility];  Service: Cardiovascular;  Laterality: N/A;   Past Medical History:  Diagnosis Date   Asthma    a. Childhood.   Coronary artery disease    a. NSTEMI s/p DES to dRCA 2007. b. Angina 07/2009: s/p DESx2 to mid RCA for ISR and prox RCA, EF 65%. c. Stress test 05/2013: low risk, no evidence of ischemia, EF 61%.   Dyslipidemia    GERD (gastroesophageal reflux disease)    History of kidney stones    Hypertension    Peripheral vascular disease (HCC)    Prostate cancer ([place])    a. s/p radical prostatectomy.   PVC's (premature ventricular contractions)    S/P mitral valve clip implantation 08/19/2020   s/p MitraClip implantation with a XTW by Dr. Burt Knack    Transient global amnesia    a. Adm 2009: imaging nonacute, had resolved.   BP (!) 153/87   Pulse (!) 56   Ht '5\' 9"'$  (1.753 m)   Wt 149 lb (67.6 kg)   SpO2 96%   BMI 22.00 kg/m   Opioid Risk Score:   Fall Risk Score:  `1  Depression screen [facility] 2/9     05/19/2022   11:03 AM  Depression screen PHQ 2/9  Decreased Interest 0  Down, Depressed, Hopeless 1  PHQ - 2 Score 1  Altered sleeping 0  Tired, decreased energy 1  Change in appetite 0  Feeling bad or failure about yourself  0  Trouble concentrating 0  Moving slowly or  fidgety/restless 3  Suicidal thoughts 0  PHQ-9 Score 5  Difficult doing work/chores Not difficult at all      Review of Systems  Musculoskeletal:  Positive for joint swelling.  All other systems reviewed and are negative.     Objective:   Physical Exam Vitals and nursing note reviewed.  Constitutional:      Appearance: Normal appearance.  Cardiovascular:     Rate and Rhythm: Normal rate and regular rhythm.     Pulses: Normal pulses.     Heart sounds: Normal heart sounds.  Pulmonary:     Effort: Pulmonary effort is normal.     Breath sounds: Normal breath sounds.  Musculoskeletal:     Cervical back: Normal range of motion and neck supple.     Comments: Normal Muscle Bulk and Muscle Testing Reveals:  Upper Extremities: Full ROM and Muscle Strength 5/5  Lower Extremities: Full ROM and Muscle Strength 5/5 Arises from Table with ease Narrow Based  Gait     Skin:    General: Skin is warm and dry.  Neurological:     Mental Status: He is alert and oriented to person, place, and time.  Psychiatric:        Mood and Affect: Mood normal.        Behavior: Behavior normal.         Assessment & Plan:  1.Acute Ischemic left posterior cerebral artery stroke: He has a schedulked appointment with Neurology. Continue outpatient therapy at [name].  2.  Essential Hypertension.: Continue current medication regimen . Continue to Monitor.   F/U with Dr Letta Pate in 4-6 weeks

## 2022-05-19 NOTE — Therapy (Signed)
OUTPATIENT SPEECH LANGUAGE PATHOLOGY TREATMENT NOTE   Patient Name: Adam Arellano MRN: [id number] DOB:08-Jun-1931, 88 y.o., male Today's Date: 05/19/2022  PCP: Lajean Manes MD  REFERRING PROVIDER: Cathlyn Parsons, PA-C  END OF SESSION:  End of Session - 05/19/22 0754     Visit Number 2    Number of Visits 17    Date for SLP Re-Evaluation 07/04/22    Authorization Type UHC Medicare    SLP Start Time 0800    SLP Stop Time  0845    SLP Time Calculation (min) 45 min    Activity Tolerance Patient tolerated treatment well             Past Medical History:  Diagnosis Date   Asthma    a. Childhood.   Coronary artery disease    a. NSTEMI s/p DES to dRCA 2007. b. Angina 07/2009: s/p DESx2 to mid RCA for ISR and prox RCA, EF 65%. c. Stress test 05/2013: low risk, no evidence of ischemia, EF 61%.   Dyslipidemia    GERD (gastroesophageal reflux disease)    History of kidney stones    Hypertension    Peripheral vascular disease (HCC)    Prostate cancer ([place])    a. s/p radical prostatectomy.   PVC's (premature ventricular contractions)    S/P mitral valve clip implantation 08/19/2020   s/p MitraClip implantation with a XTW by Dr. Burt Knack    Transient global amnesia    a. Adm 2009: imaging nonacute, had resolved.   Past Surgical History:  Procedure Laterality Date   CARDIAC CATHETERIZATION     COLONOSCOPY     CORONARY STENT INTERVENTION N/A 08/06/2020   Procedure: CORONARY STENT INTERVENTION;  Surgeon: Sherren Mocha, MD;  Location: [place] [facility];  Service: Cardiovascular;  Laterality: N/A;   EYE SURGERY Bilateral    cataracts removed   MASTOIDECTOMY     L ear   MITRAL VALVE REPAIR     MITRAL VALVE REPAIR N/A 08/19/2020   Procedure: MITRAL VALVE REPAIR;  Surgeon: Sherren Mocha, MD;  Location: [place] [facility];  Service: Cardiovascular;  Laterality: N/A;   PROSTATECTOMY     RIGHT/LEFT HEART CATH AND CORONARY ANGIOGRAPHY N/A 08/06/2020   Procedure: RIGHT/LEFT  HEART CATH AND CORONARY ANGIOGRAPHY;  Surgeon: Sherren Mocha, MD;  Location: [place] [facility];  Service: Cardiovascular;  Laterality: N/A;   TEE WITHOUT CARDIOVERSION N/A 08/06/2020   Procedure: TRANSESOPHAGEAL ECHOCARDIOGRAM (TEE);  Surgeon: Sanda Klein, MD;  Location: [facility] END[facility]COPY;  Service: Cardiovascular;  Laterality: N/A;   TEE WITHOUT CARDIOVERSION  08/19/2020   TEE WITHOUT CARDIOVERSION N/A 08/19/2020   Procedure: TRANSESOPHAGEAL ECHOCARDIOGRAM (TEE);  Surgeon: Sherren Mocha, MD;  Location: [place] [facility];  Service: Cardiovascular;  Laterality: N/A;   Patient Active Problem List   Diagnosis Date Noted   Acute ischemic left posterior cerebral artery (PCA) stroke ([place]) 04/07/2022   Acute ischemic stroke ([place]) 04/02/2022   Hypokalemia 04/02/2022   Fall at home, initial encounter 04/02/2022   Mitral valve disorder 01/16/2022   Paresthesia 01/16/2022   Exudative age-related macular degeneration of left eye with active choroidal neovascularization ([place]) 11/14/2021   Advanced nonexudative age-related macular degeneration of right eye without subfoveal involvement 11/14/2021   Advanced nonexudative age-related macular degeneration of left eye without subfoveal involvement 11/14/2021   S/P mitral valve clip implantation 08/19/2020   Prostate cancer ([place])    PVC's (premature ventricular contractions)    Peripheral vascular disease ([place])    Coronary artery disease  Nonrheumatic mitral valve regurgitation    Anemia in chronic kidney disease 04/24/2020   Stage 3b chronic kidney disease ([place]) 04/24/2020   History of malignant neoplasm of prostate 04/24/2020   Hypertensive renal disease 04/24/2020   Otorrhea of left ear 01/12/2020   Tympanic membrane perforation, left 01/12/2020   Acute non-recurrent sinusitis 08/07/2018   Tympanic membrane perforation, marginal, right 08/07/2018   Cholesteatoma of attic of ear, left 01/23/2017   Conductive hearing loss of both ears 01/23/2017    Laryngopharyngeal reflux (LPR) 01/23/2017   Perennial allergic rhinitis 01/23/2017   Sensorineural hearing loss (SNHL), bilateral 08/17/2015   Bilateral chronic secretory otitis media 08/09/2015   Chronic swimmer's ear of both sides 08/09/2015   History of colonic polyps 08/09/2015   Dysfunction of both eustachian tubes 07/19/2015   Primary hypertension 02/27/2014   Hyperlipidemia 06/10/2013    ONSET DATE: 04-02-22  REFERRING DIAG: I63.9 (ICD-10-CM) - CVA (cerebral vascular accident   THERAPY DIAG:  Cognitive communication deficit  Aphasia  Rationale for Evaluation and Treatment: Rehabilitation  SUBJECTIVE:   SUBJECTIVE STATEMENT: "it's a nervous habit"   PAIN: Are you having pain? No  PATIENT GOALS: "get back to work"  OBJECTIVE:   PATIENT REPORTED OUTCOME MEASURES (PROM): Not completed due to time constraints   TODAY'S TREATMENT:                                                                                                                                         05-19-22: [name] evidencing usual memory lapses throughout conversation with SLP, asking same questions repeatedly about topics already discussed. Evidencing anomia re: favorite places to eat, people's names, etc. Tells SLP "it just goes and I can't say it." SLP educates on anomia strategy of description. In response to questioning cues, pt able to describe x3 usually frequented places and x2 common objects. Provided daughter with education on cueing strategies to [name] in word recall when anomia occurs. Daughter able to demonstrate semantic cueing and phonemic cueing with mod-I. Generated strategies to facilitate improved independence in medication management. Strategies to employ include routine, environmental cues, and visual aids. Education on fading cues from daughter as able. Daughter verbalizes understanding.   05-09-22: Trialed education and instruction of novel information (take sip of water/swallow  versus "nervous cough"). Pt able to recall new instructions immediately; however, pt unable to recall after short delay. Usual prompting and repetition were effective to aid recall of new information throughout evaluation. Educated patient and daughter on POC to identify and address functional goals to assist with maximizing patient return to baseline. Both verbalized understanding and agreement with POC.    PATIENT EDUCATION: Education details: see above Person educated: Patient and Child(ren) Education method: Customer service manager Education comprehension: verbalized understanding, returned demonstration, and needs further education   GOALS: Goals reviewed with patient? Yes  SHORT TERM GOALS: Target date: 06/06/2022  Pt will utilize word  retrieval strategies on structured speech tasks with 80% accuracy given occasional mod A  Baseline: Goal status: IN PROGRESS  2.  Pt will recall novel strategies to reduce "nervous cough" for 4/5 opportunities given occasional mod A over 2 sessions  Baseline:  Goal status: IN PROGRESS  3.  Pt will utilize memory supports to aid recall on structured functional tasks (med management, etc) given occasional mod A over 2 sessions  Baseline:  Goal status: IN PROGRESS   LONG TERM GOALS: Target date: 07/04/2022  Pt will utilize word retrieval strategies to effectively relay work related information for 2/2 opportunities given occasional min A  Baseline:  Goal status: IN PROGRESS  2.  Pt will recall personally relevant information (medications, etc) with use of memory supports given occasional min A over 2 sessions  Baseline:  Goal status: IN PROGRESS  3.  Pt will demonstrate adequate cognitive ability to accurately perform functional tasks related to job with use of cognitive compensations given occasional min A Baseline:  Goal status: IN PROGRESS  ASSESSMENT:  CLINICAL IMPRESSION: Patient is a 88 y.o. male who was seen today for CVA in  October 2023. Daughter, [name], provides past hx due to patient short term recall deficits. Pt was independent prior, including living alone and working part time. Baseline impulsivity endorsed by daughter, which was evidenced in fast, unsteady transfers today. Impaired short term recall and reduced awareness exhibited with limited awareness of current deficits or functional impact of his memory deficits observed. He currently lives with his daughter who is managing his medications and finances. Pt desires to return to work in some capacity. Recall benefited from repetition and cues this session. Occasional word finding reported and exhibited (albeit improved since hospitalization), with pt identifying 6/15 items on BNT. Descriptions, first letter cues, and written cues were intermittently effective. Hearing loss and changes in vision may have been contributing to patient performance this session. Pt would benefit from skilled ST intervention to maximize cognitive function and communication effectiveness to optimize patient return to baseline.   OBJECTIVE IMPAIRMENTS: include attention, memory, awareness, executive functioning, and aphasia. These impairments are limiting patient from managing medications, managing appointments, managing finances, household responsibilities, ADLs/IADLs, and effectively communicating at home and in community. Factors affecting potential to achieve goals and functional outcome are ability to learn/carryover information. Patient will benefit from skilled SLP services to address above impairments and improve overall function.  REHAB POTENTIAL: Good  PLAN:  SLP FREQUENCY: 2x/week  SLP DURATION: 8 weeks  PLANNED INTERVENTIONS: Language facilitation, Cueing hierachy, Cognitive reorganization, Internal/external aids, Functional tasks, Multimodal communication approach, SLP instruction and feedback, Compensatory strategies, and Patient/family education    Su Monks,  CCC-SLP 05/19/2022, 7:55 AM

## 2022-05-21 ENCOUNTER — Encounter: Payer: Self-pay | Admitting: Registered Nurse

## 2022-05-22 ENCOUNTER — Encounter: Payer: Self-pay | Admitting: Occupational Therapy

## 2022-05-22 ENCOUNTER — Encounter: Payer: Self-pay | Admitting: Physical Therapy

## 2022-05-22 ENCOUNTER — Ambulatory Visit: Payer: Medicare Other | Admitting: Physical Therapy

## 2022-05-22 ENCOUNTER — Ambulatory Visit: Payer: Medicare Other | Admitting: Occupational Therapy

## 2022-05-22 VITALS — BP 169/77 | HR 55

## 2022-05-22 DIAGNOSIS — I69318 Other symptoms and signs involving cognitive functions following cerebral infarction: Secondary | ICD-10-CM | POA: Diagnosis not present

## 2022-05-22 DIAGNOSIS — M6281 Muscle weakness (generalized): Secondary | ICD-10-CM | POA: Diagnosis not present

## 2022-05-22 DIAGNOSIS — R278 Other lack of coordination: Secondary | ICD-10-CM

## 2022-05-22 DIAGNOSIS — R2689 Other abnormalities of gait and mobility: Secondary | ICD-10-CM

## 2022-05-22 DIAGNOSIS — R2681 Unsteadiness on feet: Secondary | ICD-10-CM | POA: Diagnosis not present

## 2022-05-22 DIAGNOSIS — R41841 Cognitive communication deficit: Secondary | ICD-10-CM | POA: Diagnosis not present

## 2022-05-22 DIAGNOSIS — I69351 Hemiplegia and hemiparesis following cerebral infarction affecting right dominant side: Secondary | ICD-10-CM

## 2022-05-22 DIAGNOSIS — R4701 Aphasia: Secondary | ICD-10-CM | POA: Diagnosis not present

## 2022-05-22 NOTE — Therapy (Signed)
OUTPATIENT PHYSICAL THERAPY NEURO TREATMENT   Patient Name: Adam Arellano MRN: [id number] DOB:03-10-1932, 88 y.o., male Today's Date: 05/22/2022  PCP: Adam Manes, MD REFERRING PROVIDER: Cathlyn Parsons, PA-C  END OF SESSION:  PT End of Session - 05/22/22 0854     Visit Number 4    Number of Visits 17    Date for PT Re-Evaluation 07/07/22    Authorization Type UHC Medicare    Authorization Time Period 05-09-22 - 07-07-22    PT Start Time 1020    PT Stop Time 1102    PT Time Calculation (min) 42 min    Equipment Utilized During Treatment Gait belt    Activity Tolerance Patient tolerated treatment well    Behavior During Therapy WFL for tasks assessed/performed              Past Medical History:  Diagnosis Date   Asthma    a. Childhood.   Coronary artery disease    a. NSTEMI s/p DES to dRCA 2007. b. Angina 07/2009: s/p DESx2 to mid RCA for ISR and prox RCA, EF 65%. c. Stress test 05/2013: low risk, no evidence of ischemia, EF 61%.   Dyslipidemia    GERD (gastroesophageal reflux disease)    History of kidney stones    Hypertension    Peripheral vascular disease (HCC)    Prostate cancer ([place])    a. s/p radical prostatectomy.   PVC's (premature ventricular contractions)    S/P mitral valve clip implantation 08/19/2020   s/p MitraClip implantation with a XTW by Dr. Burt Knack    Transient global amnesia    a. Adm 2009: imaging nonacute, had resolved.   Past Surgical History:  Procedure Laterality Date   CARDIAC CATHETERIZATION     COLONOSCOPY     CORONARY STENT INTERVENTION N/A 08/06/2020   Procedure: CORONARY STENT INTERVENTION;  Surgeon: Sherren Mocha, MD;  Location: [place] [facility];  Service: Cardiovascular;  Laterality: N/A;   EYE SURGERY Bilateral    cataracts removed   MASTOIDECTOMY     L ear   MITRAL VALVE REPAIR     MITRAL VALVE REPAIR N/A 08/19/2020   Procedure: MITRAL VALVE REPAIR;  Surgeon: Sherren Mocha, MD;  Location: [place] [facility];   Service: Cardiovascular;  Laterality: N/A;   PROSTATECTOMY     RIGHT/LEFT HEART CATH AND CORONARY ANGIOGRAPHY N/A 08/06/2020   Procedure: RIGHT/LEFT HEART CATH AND CORONARY ANGIOGRAPHY;  Surgeon: Sherren Mocha, MD;  Location: [place] [facility];  Service: Cardiovascular;  Laterality: N/A;   TEE WITHOUT CARDIOVERSION N/A 08/06/2020   Procedure: TRANSESOPHAGEAL ECHOCARDIOGRAM (TEE);  Surgeon: Sanda Klein, MD;  Location: [facility] ENDOSCOPY;  Service: Cardiovascular;  Laterality: N/A;   TEE WITHOUT CARDIOVERSION  08/19/2020   TEE WITHOUT CARDIOVERSION N/A 08/19/2020   Procedure: TRANSESOPHAGEAL ECHOCARDIOGRAM (TEE);  Surgeon: Sherren Mocha, MD;  Location: [place] [facility];  Service: Cardiovascular;  Laterality: N/A;   Patient Active Problem List   Diagnosis Date Noted   Acute ischemic left posterior cerebral artery (PCA) stroke ([place]) 04/07/2022   Acute ischemic stroke ([place]) 04/02/2022   Hypokalemia 04/02/2022   Fall at home, initial encounter 04/02/2022   Mitral valve disorder 01/16/2022   Paresthesia 01/16/2022   Exudative age-related macular degeneration of left eye with active choroidal neovascularization ([place]) 11/14/2021   Advanced nonexudative age-related macular degeneration of right eye without subfoveal involvement 11/14/2021   Advanced nonexudative age-related macular degeneration of left eye without subfoveal involvement 11/14/2021   S/P mitral valve clip implantation  08/19/2020   Prostate cancer ([place])    PVC's (premature ventricular contractions)    Peripheral vascular disease (HCC)    Coronary artery disease    Nonrheumatic mitral valve regurgitation    Anemia in chronic kidney disease 04/24/2020   Stage 3b chronic kidney disease ([place]) 04/24/2020   History of malignant neoplasm of prostate 04/24/2020   Hypertensive renal disease 04/24/2020   Otorrhea of left ear 01/12/2020   Tympanic membrane perforation, left 01/12/2020   Acute non-recurrent sinusitis 08/07/2018   Tympanic  membrane perforation, marginal, right 08/07/2018   Cholesteatoma of attic of ear, left 01/23/2017   Conductive hearing loss of both ears 01/23/2017   Laryngopharyngeal reflux (LPR) 01/23/2017   Perennial allergic rhinitis 01/23/2017   Sensorineural hearing loss (SNHL), bilateral 08/17/2015   Bilateral chronic secretory otitis media 08/09/2015   Chronic swimmer's ear of both sides 08/09/2015   History of colonic polyps 08/09/2015   Dysfunction of both eustachian tubes 07/19/2015   Primary hypertension 02/27/2014   Hyperlipidemia 06/10/2013    ONSET DATE: 04-02-22  REFERRING DIAG:  Diagnosis  I63.9 (ICD-10-CM) - CVA (cerebral vascular accident) ([place])    THERAPY DIAG:  Unsteadiness on feet  Other abnormalities of gait and mobility  Muscle weakness (generalized)  Other lack of coordination  Rationale for Evaluation and Treatment: Rehabilitation  SUBJECTIVE:                                                                                                                                                                                             SUBJECTIVE STATEMENT: Patient prefers to go by Adam Arellano.  Patient reports that he is doing okay. Patient reports that he is walking the driveway. Patient reports that he can walk continuously 15-20 minutes; however, daughter at end of session reports that he is able to walk 2 laps on driveway for about 10 minutes before requiring a break. Patient denies falls since last seen. Patient reports that he is going back home for a few nights and is daughter will stay there with him. Patient repeatedly asks therapists if they have asked before.  Pt accompanied by: self - daughter Adam Arellano arrives at end of session   PERTINENT HISTORY: Brief HPI:   Adam Arellano is a 88 y.o. male  who was found down at his home my his neighbor on 04/02/2022. He presented to the [facility] ED with right-sided weakness, mild dysarthria and right hemianopsia on  evaluation. Imaging was significant for left PICA infarct. No apparent injuries. Neurology consulted and resumed his Plavix and started aspirin 81 mg for 3 months then aspirin alone. Outside of window for  tenecteplase and no evidence of LVO. Recommended resuming home statin; however, per PCP notes, the patient has stopped his home Crestor due to "aches".   Discharge Diagnoses:  Principal Problem:   Acute ischemic left posterior cerebral artery (PCA) stroke (HCC) Active Problems:   Primary hypertension   Stage 3b chronic kidney disease (HCC) Functional deficits secondary to acute ischemic left posterior cerebral artery stroke Multiple skin tears Hyperlipidemia Coronary artery disease Prostate cancer Mitral regurgitation GERD Diplopia consipation     PAIN:  Are you having pain? No  PRECAUTIONS: Fall  PATIENT GOALS: "Get back to being myself again"  OBJECTIVE:   Today's Vitals   05/22/22 1025  BP: (!) 169/77  Pulse: (!) 55    **All activities performed with gait belt   Gait: Trial gait with SPC x 300' with CGA  Trialed RUE and LUE, alt leg and cane pattern  X1 foot catch but was able to recover with min A from therapist Gait with SPC + 3lb ankle weights for proprioceptive feedback x 200' (CGA)   NMR: 4" box taps with cues to increase foot clearance (x3 error with RLE due to poor clearance) (CGA-minA)  Blaze pod reactive stepping 3 x 30" with navigation to chair for seated rest break between sets (CGA-min A with mod verbal cues for safe navigation around blaze pods)  TherAct: Fishing bubble dots with emphasis on static standing balance tasks ( BL semitandem, EO on foam wide base, EO on foam narrow base x 30") (CGA-minA, hand assist to step down)  PATIENT EDUCATION: Education details: Recommended use of FWW for safety at this time as SPC use was not ideal for patient Person educated: Patient and Child(ren) Education method: Explanation Education comprehension:  verbalized understanding  HOME EXERCISE PROGRAM: Access Code: [email or web address] URL: [email or web address] Date: 05/16/2022 Prepared by: Ethelene Browns  Exercises - Standing March with Counter Support  - 1 x daily - 7 x weekly - 1 sets - 10 reps - Standing Hip Flexion with Counter Support  - 1 x daily - 7 x weekly - 1 sets - 10 reps - Standing Hip Extension with Unilateral Counter Support  - 1 x daily - 7 x weekly - 1 sets - 10 reps - Standing Hip Abduction with Counter Support  - 1 x daily - 7 x weekly - 1 sets - 10 reps - Standing Romberg to 1/4 Tandem Stance  - 1 x daily - 7 x weekly - 1 sets - 1-2 reps - 15 secs hold - Romberg Stance  - 1 x daily - 7 x weekly - 1 sets - 1-2 reps - 15 sec hold hold - Single Leg Stance with Arms Out  - 1 x daily - 7 x weekly - 1 sets - 1-2 reps - 10 sec hold - Sit to Stand Without Arm Support  - 1 x daily - 7 x weekly - 1 sets - 10 reps   GOALS: Goals reviewed with patient? Yes  SHORT TERM GOALS: Target date: 06-09-21  Improve Berg score to >/= 40/56 to demo improved balance and to decrease fall risk. Baseline: 35/56 Goal status: INITIAL  2.  Pt will improve TUG score without use of RW from 15.16 secs to </= 14 secs for reduced fall risk. Baseline: 15.16 secs Goal status: INITIAL  3.  Amb. Household distances modified independently without use of RW. Baseline:  Goal status: INITIAL  4.  Amb. 500' without device with CGA on flat even, uneven surfaces for increased community accessibility.  Baseline:  Goal status: INITIAL  5.  Independent in HEP for balance and strengthening exercises.  Baseline:  Goal status: INITIAL   LONG TERM GOALS: Target date: 07-07-21  Improve FOTO score by at least 10 points to demo improvement in mobility. Baseline:  Goal status: INITIAL  Improve Berg score to >/= 46/56 to demo improved balance and to decrease fall risk. Baseline: 35/56 Goal status: INITIAL  3.   Pt will improve TUG score without use of  RW from 15.16 secs to </= 12 secs for reduced fall risk. Baseline: 15.16 secs Goal status: INITIAL  4.   Amb. 800' without device with supervision on flat even, uneven surfaces for increased community accessibility. Baseline:  Goal status: INITIAL  5.  Pt will perform floor to stand transfer with UE support with SBA.  Baseline:  Goal status: INITIAL  6.  Independent in updated HEP for balance and strengthening exs.  Baseline:  Goal status: INITIAL   ASSESSMENT:  CLINICAL IMPRESSION: PT session focused on functional dynamic balance with use of ankle weight for proprioceptive feedback to improve error augmentation. Trialed use of SPC cane to improve patient's safety with gait over compliant surface. Patient demonstrated improvements with use of cane in RUE and reported improved balance but patient required mod verbal cues from therapist to use cane appropriately. Patient continues to demonstrate shuffled gait and poor navigation around objects - frequently running into blaze pod between sets. Therapist continued to encourage use of FWW at this time for community ambulation given patient's balance presentation in today's session. Patient will benefit from skilled physical therapy to address impairments and improve functional mobility. Continue with POC.  OBJECTIVE IMPAIRMENTS: decreased activity tolerance, decreased balance, decreased knowledge of use of DME, decreased strength, and impaired vision/preception.   ACTIVITY LIMITATIONS: carrying, bending, standing, squatting, transfers, and locomotion level  PARTICIPATION LIMITATIONS: meal prep, cleaning, laundry, driving, shopping, community activity, and yard work  PERSONAL FACTORS: Behavior pattern and 1-2 comorbidities: diplopia and mild impulsivity  are also affecting patient's functional outcome.   REHAB POTENTIAL: Good  CLINICAL DECISION MAKING: Evolving/moderate complexity  EVALUATION COMPLEXITY: Moderate  PLAN:  PT FREQUENCY:  2x/week  PT DURATION: 8 weeks  PLANNED INTERVENTIONS: Therapeutic exercises, Therapeutic activity, Neuromuscular re-education, Balance training, Gait training, Patient/Family education, Self Care, Stair training, and DME instructions  PLAN FOR NEXT SESSION: issued on 05-16-22: dynamic gait without device, balance training,  functional strengthening, coordination, ankle weights for input, trial U step or FWW with band to promote improved stride length, scanning tasks to improve safety around obstacle navigation   Malachi Carl, PT, DPT  05/22/2022, 12:17 PM

## 2022-05-22 NOTE — Patient Instructions (Signed)
  Coordination Activities  Perform the following activities for 10 minutes 1-2 times per day with right hand(s).  Rotate ball in fingertips (clockwise and counter-clockwise). Toss ball in air and catch with the same hand. Flip cards 1 at a time as fast as you can. Deal cards with your thumb (Hold deck in hand and push card off top with thumb). Rotate ONE card in hand (clockwise and counter-clockwise). Pick up coins and place in container or coin bank. Pick up coins one at a time until you get 5 in your hand, then move coins from palm to fingertips to stack one at a time. Practice writing  Screw together nuts and bolts, then unfasten.   VISUAL SCANNING STRATEGIES 1. Look for the edge of objects (to the left and/or right) so that you make sure you are seeing all of an object 2. Turn your head when walking, scan from side to side, particularly in busy environments 3. Use an organized scanning pattern. It's usually easier to scan from top to bottom, and left to right (like you are reading) 4. Double check yourself 5. Use a line guide (like a blank piece of paper) or your finger when reading 6. If necessary, place brightly colored tape at end of table or work area as a reminder to always look until you see the tape.   Activities to try at home to encourage visual scanning:   1. Word searches 2. Mazes 3. Puzzles 4. Card games 5. Computer games and/or searches 6. Connect-the-dots  Activities for environmental (larger) scanning:  1. With supervision, scan for items in grocery store or drugstore.  Begin with a familiar store, then progress to a new store you've never been in before. Make sure you have supervision with this.

## 2022-05-22 NOTE — Therapy (Signed)
OUTPATIENT OCCUPATIONAL THERAPY NEURO TREATMENT  Patient Name: Adam Arellano MRN: [id number] DOB:[birth year], 88 y.o., male Today's Date: 05/22/2022  PCP: Adam Lot, MD REFERRING PROVIDER: Cathlyn Parsons, PA-C  END OF SESSION:  OT End of Session - 05/22/22 0937     Visit Number 2    Number of Visits 16    Date for OT Re-Evaluation 07/10/22    Authorization Type UHC MCR    OT Start Time 0935    OT Stop Time 1015    OT Time Calculation (min) 40 min    Activity Tolerance Patient tolerated treatment well    Behavior During Therapy WFL for tasks assessed/performed             Past Medical History:  Diagnosis Date   Asthma    a. Childhood.   Coronary artery disease    a. NSTEMI s/p DES to dRCA 2007. b. Angina 07/2009: s/p DESx2 to mid RCA for ISR and prox RCA, EF 65%. c. Stress test 05/2013: low risk, no evidence of ischemia, EF 61%.   Dyslipidemia    GERD (gastroesophageal reflux disease)    History of kidney stones    Hypertension    Peripheral vascular disease (HCC)    Prostate cancer ([facility])    a. s/p radical prostatectomy.   PVC's (premature ventricular contractions)    S/P mitral valve clip implantation 08/19/2020   s/p MitraClip implantation with a XTW by [facility]    Transient global amnesia    a. Adm 2009: imaging nonacute, had resolved.   Past Surgical History:  Procedure Laterality Date   CARDIAC CATHETERIZATION     COLONOSCOPY     CORONARY STENT INTERVENTION N/A 08/06/2020   Procedure: CORONARY STENT INTERVENTION;  Surgeon: Sherren Mocha, MD;  Location: [facility];  Service: Cardiovascular;  Laterality: N/A;   EYE SURGERY Bilateral    cataracts removed   MASTOIDECTOMY     L ear   MITRAL VALVE REPAIR     MITRAL VALVE REPAIR N/A 08/19/2020   Procedure: MITRAL VALVE REPAIR;  Surgeon: Sherren Mocha, MD;  Location: [facility];  Service: Cardiovascular;  Laterality: N/A;   PROSTATECTOMY     RIGHT/LEFT HEART CATH AND CORONARY  ANGIOGRAPHY N/A 08/06/2020   Procedure: RIGHT/LEFT HEART CATH AND CORONARY ANGIOGRAPHY;  Surgeon: Sherren Mocha, MD;  Location: [facility];  Service: Cardiovascular;  Laterality: N/A;   TEE WITHOUT CARDIOVERSION N/A 08/06/2020   Procedure: TRANSESOPHAGEAL ECHOCARDIOGRAM (TEE);  Surgeon: Sanda Klein, MD;  Location: [facility];  Service: Cardiovascular;  Laterality: N/A;   TEE WITHOUT CARDIOVERSION  08/19/2020   TEE WITHOUT CARDIOVERSION N/A 08/19/2020   Procedure: TRANSESOPHAGEAL ECHOCARDIOGRAM (TEE);  Surgeon: Sherren Mocha, MD;  Location: [facility];  Service: Cardiovascular;  Laterality: N/A;   Patient Active Problem List   Diagnosis Date Noted   Acute ischemic left posterior cerebral artery (PCA) stroke ([facility]) 04/07/2022   Acute ischemic stroke ([facility]) 04/02/2022   Hypokalemia 04/02/2022   Fall at home, initial encounter 04/02/2022   Mitral valve disorder 01/16/2022   Paresthesia 01/16/2022   Exudative age-related macular degeneration of left eye with active choroidal neovascularization ([place]) 11/14/2021   Advanced nonexudative age-related macular degeneration of right eye without subfoveal involvement 11/14/2021   Advanced nonexudative age-related macular degeneration of left eye without subfoveal involvement 11/14/2021   S/P mitral valve clip implantation 08/19/2020   Prostate cancer ([facility])    PVC's (premature ventricular contractions)    Peripheral vascular disease ([facility])  Coronary artery disease    Nonrheumatic mitral valve regurgitation    Anemia in chronic kidney disease 04/24/2020   Stage 3b chronic kidney disease ([facility]) 04/24/2020   History of malignant neoplasm of prostate 04/24/2020   Hypertensive renal disease 04/24/2020   Otorrhea of left ear 01/12/2020   Tympanic membrane perforation, left 01/12/2020   Acute non-recurrent sinusitis 08/07/2018   Tympanic membrane perforation, marginal, right 08/07/2018   Cholesteatoma of attic of ear, left 01/23/2017    Conductive hearing loss of both ears 01/23/2017   Laryngopharyngeal reflux (LPR) 01/23/2017   Perennial allergic rhinitis 01/23/2017   Sensorineural hearing loss (SNHL), bilateral 08/17/2015   Bilateral chronic secretory otitis media 08/09/2015   Chronic swimmer's ear of both sides 08/09/2015   History of colonic polyps 08/09/2015   Dysfunction of both eustachian tubes 07/19/2015   Primary hypertension 02/27/2014   Hyperlipidemia 06/10/2013    ONSET DATE: 04/20/2022 (referral date)   REFERRING DIAG: I63.9 (ICD-10-CM) - CVA (cerebral vascular accident) ([facility])  THERAPY DIAG:  Other lack of coordination  Muscle weakness (generalized)  Hemiplegia and hemiparesis following cerebral infarction affecting right dominant side ([place])  Rationale for Evaluation and Treatment: Rehabilitation  SUBJECTIVE:   SUBJECTIVE STATEMENT: Pt goes by [name] Daughter reports vision affected, old rotator cuff tear Rt shoulder, macular degeneration Pt accompanied by: self  PERTINENT HISTORY:  He presented to [facility] 04[facility]/22 with right-sided weakness, mild dysarthria and right hemianopsia on evaluation. Imaging was significant for left PICA infarct.  PMH: Primary hypertension   Stage 3b chronic kidney disease ([facility]) Functional deficits secondary to acute ischemic left posterior cerebral artery stroke Multiple skin tears Hyperlipidemia Coronary artery disease Prostate cancer Mitral regurgitation GERD Diplopia Macular degeneration  PRECAUTIONS: Other: no driving, heart monitor  WEIGHT BEARING RESTRICTIONS: No  PAIN:  Are you having pain? No  FALLS: Has patient fallen in last 6 months? Yes. Number of falls 1 w/ stroke  LIVING ENVIRONMENT: Lives with: lives with their family and lives alone, currently staying with daughter Pt lives in 2 story home but stays on main level, level entry. (Daughter has 6 STE her house)  Has following equipment at home: Environmental consultant - 2 wheeled, shower chair,  Shower bench, and Grab bars  PLOF: Independent  PATIENT GOALS: shower I'ly  OBJECTIVE:   HAND DOMINANCE: Right  ADLs: Eating: using bigger handles, sometimes assist cutting meat Grooming: mod I w/ electric razor UB Dressing: independent  LB Dressing: supervision with donning underwear on shower chair Toileting: independent Bathing: mod I seated Tub Shower transfers: supervision/cues Equipment: Radio broadcast assistant and Grab bars  *Pt's daughter reports that he will most likely not return to living alone  IADLs: Shopping: dependent Light housekeeping: dependent (pt had housekeeper every 2 weeks for heavier cleaning)  Meal Prep: dependent  Community mobility: relies on family for transportation Medication management: family fills pillbox and reminds patient at times Financial management: daughter POA and now taking over Handwriting: 25% legible (premorbidly was not great but worse since stroke)   MOBILITY STATUS:  sometimes uses walker   UPPER EXTREMITY ROM:  Rt shoulder limited somewhat premorbidly d/t rotator cuff tear (not surgical candidate). BUE's elbows distally WFL's  HAND FUNCTION: Grip strength: Right: 60.6 lbs; Left: 56.4 lbs  COORDINATION: 9 Hole Peg test: Right: 49.09  sec; Left: 36.75 sec (Rt shoulder slightly limited coordination as well)   SENSATION: Diminshed/dull RT hand  EDEMA: none   COGNITION: Overall cognitive status: Impaired and short term memory  VISION: Subjective  report: Rt eye was affected from stroke (per daughter report) Baseline vision: Bifocals and Wears glasses all the time Visual history: macular degeneration (wet)   VISION ASSESSMENT: Visual Fields: Right homonymous hemianopsia and Right visual field deficits     TODAY'S TREATMENT:                                                                                                                               Pt issued coordination HEP and visual scanning strategies - see pt  instructions for details   Practiced writing with cues to slow down, focus on writing each letter, sliding forearm, and writing in print. Pt issued built up foam for pen as this also improved handwriting. Pt went from no legibility to approx 90% legibility writing simple sentence in print.   Letter cancellation (81M print size) - pt with difficulty staying on correct line even with line guide. Pt with max difficulty and omissions (approx only 60% accuracy)  PATIENT EDUCATION: Education details: coordination HEP, visual scanning strategies Person educated: Patient Education method: Explanation, Demonstration, Verbal cues, and Handouts Education comprehension: verbalized understanding, returned demonstration, verbal cues required, and needs further education   HOME EXERCISE PROGRAM: 05/22/22: Coordination HEP, visual scanning strategies   GOALS: Goals reviewed with patient? Yes  SHORT TERM GOALS: Target date: 06/09/22  Independent with HEP for Rt hand coordination Baseline: Goal status: IN PROGRESS  2.  Pt will verbalize understanding with visual scanning strategies Baseline:  Goal status: IN PROGRESS  3.  Pt will verbalize understanding with memory strategies for ADLS Baseline:  Goal status: INITIAL  4.  Pt will consistently cut food Baseline:  Goal status: INITIAL   LONG TERM GOALS: Target date: 07/10/22  Independent with updated HEP prn, and review HEP for Rt shoulder (provided by ortho clinic)  Baseline:  Goal status: INITIAL  2.  Pt to increase handwriting to 75% legibility in print for name and address Baseline:  Goal status: INITIAL  3.  Pt to perform environmental scanning at 75% or greater legibility Baseline:  Goal status: INITIAL  4.  Pt to perform light IADLS (folding clothes, washing dishes) and simple sandwich/microwaveable items consistently and safely at mod I level Baseline:  Goal status: INITIAL   ASSESSMENT:  CLINICAL IMPRESSION: Pt progressing  towards STG's #1 and #2. Pt limited by vision and cognitive changes  PERFORMANCE DEFICITS: in functional skills including ADLs, IADLs, coordination, dexterity, ROM, strength, Fine motor control, Gross motor control, balance, decreased knowledge of use of DME, vision, and UE functional use, cognitive skills including attention, memory, problem solving, and safety awareness.   IMPAIRMENTS: are limiting patient from ADLs, IADLs, work, and leisure.   CO-MORBIDITIES: has co-morbidities such as macular degeneration  that affects occupational performance. Patient will benefit from skilled OT to address above impairments and improve overall function.  MODIFICATION OR ASSISTANCE TO COMPLETE EVALUATION: No modification of tasks or assist necessary to complete an evaluation.  OT OCCUPATIONAL  PROFILE AND HISTORY: Detailed assessment: Review of records and additional review of physical, cognitive, psychosocial history related to current functional performance.  CLINICAL DECISION MAKING: Moderate - several treatment options, min-mod task modification necessary  REHAB POTENTIAL: Good  EVALUATION COMPLEXITY: Moderate    PLAN:  OT FREQUENCY: 2x/week  OT DURATION: 8 weeks (anticipate only 6 weeks needed)   PLANNED INTERVENTIONS: self care/ADL training, therapeutic exercise, therapeutic activity, neuromuscular re-education, passive range of motion, functional mobility training, fluidotherapy, moist heat, patient/family education, cognitive remediation/compensation, visual/perceptual remediation/compensation, coping strategies training, and DME and/or AE instructions  RECOMMENDED OTHER SERVICES: none at this time  CONSULTED AND AGREED WITH PLAN OF CARE: Patient and family member/caregiver  PLAN FOR NEXT SESSION: practice letter cancellation with capital letters (77M or 81M) w/ line guide above and below line and darker highlighter, copy peg design or PVC pipe design   Hans Eden, OT 05/22/2022,  9:37 AM

## 2022-05-31 DIAGNOSIS — N1832 Chronic kidney disease, stage 3b: Secondary | ICD-10-CM | POA: Diagnosis not present

## 2022-05-31 DIAGNOSIS — Z8673 Personal history of transient ischemic attack (TIA), and cerebral infarction without residual deficits: Secondary | ICD-10-CM | POA: Diagnosis not present

## 2022-05-31 NOTE — Therapy (Unsigned)
OUTPATIENT SPEECH LANGUAGE PATHOLOGY TREATMENT NOTE   Patient Name: [facility] MRN: [id number] DOB:12-20-1931, 88 y.o., male Today's Date: 06/01/2022  PCP: Lajean Manes MD  REFERRING PROVIDER: Cathlyn Parsons, PA-C  END OF SESSION:  End of Session - 06/01/22 0850     Visit Number 3    Number of Visits 17    Date for SLP Re-Evaluation 07/04/22    Authorization Type UHC Medicare    SLP Start Time 0850    SLP Stop Time  0930    SLP Time Calculation (min) 40 min    Activity Tolerance Patient tolerated treatment well              Past Medical History:  Diagnosis Date   Asthma    a. Childhood.   Coronary artery disease    a. NSTEMI s/p DES to dRCA 2007. b. Angina 07/2009: s/p DESx2 to mid RCA for ISR and prox RCA, EF 65%. c. Stress test 05/2013: low risk, no evidence of ischemia, EF 61%.   Dyslipidemia    GERD (gastroesophageal reflux disease)    History of kidney stones    Hypertension    Peripheral vascular disease (HCC)    Prostate cancer ([place])    a. s/p radical prostatectomy.   PVC's (premature ventricular contractions)    S/P mitral valve clip implantation 08/19/2020   s/p MitraClip implantation with a XTW by Dr. Burt Knack    Transient global amnesia    a. Adm 2009: imaging nonacute, had resolved.   Past Surgical History:  Procedure Laterality Date   CARDIAC CATHETERIZATION     COLONOSCOPY     CORONARY STENT INTERVENTION N/A 08/06/2020   Procedure: CORONARY STENT INTERVENTION;  Surgeon: Sherren Mocha, MD;  Location: [facility];  Service: Cardiovascular;  Laterality: N/A;   EYE SURGERY Bilateral    cataracts removed   MASTOIDECTOMY     L ear   MITRAL VALVE REPAIR     MITRAL VALVE REPAIR N/A 08/19/2020   Procedure: MITRAL VALVE REPAIR;  Surgeon: Sherren Mocha, MD;  Location: [facility];  Service: Cardiovascular;  Laterality: N/A;   PROSTATECTOMY     RIGHT/LEFT HEART CATH AND CORONARY ANGIOGRAPHY N/A 08/06/2020   Procedure: RIGHT/LEFT  HEART CATH AND CORONARY ANGIOGRAPHY;  Surgeon: Sherren Mocha, MD;  Location: [facility];  Service: Cardiovascular;  Laterality: N/A;   TEE WITHOUT CARDIOVERSION N/A 08/06/2020   Procedure: TRANSESOPHAGEAL ECHOCARDIOGRAM (TEE);  Surgeon: Sanda Klein, MD;  Location: [facility];  Service: Cardiovascular;  Laterality: N/A;   TEE WITHOUT CARDIOVERSION  08/19/2020   TEE WITHOUT CARDIOVERSION N/A 08/19/2020   Procedure: TRANSESOPHAGEAL ECHOCARDIOGRAM (TEE);  Surgeon: Sherren Mocha, MD;  Location: [facility];  Service: Cardiovascular;  Laterality: N/A;   Patient Active Problem List   Diagnosis Date Noted   Acute ischemic left posterior cerebral artery (PCA) stroke ([place]) 04/07/2022   Acute ischemic stroke ([place]) 04/02/2022   Hypokalemia 04/02/2022   Fall at home, initial encounter 04/02/2022   Mitral valve disorder 01/16/2022   Paresthesia 01/16/2022   Exudative age-related macular degeneration of left eye with active choroidal neovascularization ([place]) 11/14/2021   Advanced nonexudative age-related macular degeneration of right eye without subfoveal involvement 11/14/2021   Advanced nonexudative age-related macular degeneration of left eye without subfoveal involvement 11/14/2021   S/P mitral valve clip implantation 08/19/2020   Prostate cancer ([place])    PVC's (premature ventricular contractions)    Peripheral vascular disease ([place])    Coronary artery disease  Nonrheumatic mitral valve regurgitation    Anemia in chronic kidney disease 04/24/2020   Stage 3b chronic kidney disease ([place]) 04/24/2020   History of malignant neoplasm of prostate 04/24/2020   Hypertensive renal disease 04/24/2020   Otorrhea of left ear 01/12/2020   Tympanic membrane perforation, left 01/12/2020   Acute non-recurrent sinusitis 08/07/2018   Tympanic membrane perforation, marginal, right 08/07/2018   Cholesteatoma of attic of ear, left 01/23/2017   Conductive hearing loss of both ears 01/23/2017    Laryngopharyngeal reflux (LPR) 01/23/2017   Perennial allergic rhinitis 01/23/2017   Sensorineural hearing loss (SNHL), bilateral 08/17/2015   Bilateral chronic secretory otitis media 08/09/2015   Chronic swimmer's ear of both sides 08/09/2015   History of colonic polyps 08/09/2015   Dysfunction of both eustachian tubes 07/19/2015   Primary hypertension 02/27/2014   Hyperlipidemia 06/10/2013    ONSET DATE: 04-02-22  REFERRING DIAG: I63.9 (ICD-10-CM) - CVA (cerebral vascular accident   THERAPY DIAG:  Cognitive communication deficit  Aphasia  Rationale for Evaluation and Treatment: Rehabilitation  SUBJECTIVE:   SUBJECTIVE STATEMENT: "she's been helping me"   PAIN: Are you having pain? No  PATIENT GOALS: "get back to work"  OBJECTIVE:   PATIENT REPORTED OUTCOME MEASURES (PROM): Not completed due to time constraints   TODAY'S TREATMENT:                                                                                                                                         05-31-22: Pt attends with daughter. They ID most prominet need at this time is being able to recall details about past day and upcoming day. SLP proposed use of memory aid calendar/journal paired with daily reflections to A in recall of important or relevant details from the day. Pt in agreement. SLP provided calendar sheets (daily) with room for schedule, to-do list, and notes from the day. Demonstrates how each section can be used. Pt able to generate list of x3 items he would like to recall: who he visits with, appointments, and significant events. SLP educates on creating routine for use, with consideration of best times to employ. SLP recommends referencing in morning to orient to days events and using during nightly reflection on day. Encourage pt to recall as much as possible with cues from daughter PRN. Pt and daughter verbalize understanding.   12-15-23[name] evidencing usual memory lapses throughout  conversation with SLP, asking same questions repeatedly about topics already discussed. Evidencing anomia re: favorite places to eat, people's names, etc. Tells SLP "it just goes and I can't say it." SLP educates on anomia strategy of description. In response to questioning cues, pt able to describe x3 usually frequented places and x2 common objects. Provided daughter with education on cueing strategies to [name] in word recall when anomia occurs. Daughter able to demonstrate semantic cueing and phonemic cueing with mod-I. Generated strategies to facilitate improved independence  in medication management. Strategies to employ include routine, environmental cues, and visual aids. Education on fading cues from daughter as able. Daughter verbalizes understanding.   05-09-22: Trialed education and instruction of novel information (take sip of water/swallow versus "nervous cough"). Pt able to recall new instructions immediately; however, pt unable to recall after short delay. Usual prompting and repetition were effective to aid recall of new information throughout evaluation. Educated patient and daughter on POC to identify and address functional goals to assist with maximizing patient return to baseline. Both verbalized understanding and agreement with POC.    PATIENT EDUCATION: Education details: see above Person educated: Patient and Child(ren) Education method: Customer service manager Education comprehension: verbalized understanding, returned demonstration, and needs further education   GOALS: Goals reviewed with patient? Yes  SHORT TERM GOALS: Target date: 06/06/2022  Pt will utilize word retrieval strategies on structured speech tasks with 80% accuracy given occasional mod A  Baseline: Goal status: IN PROGRESS  2.  Pt will recall novel strategies to reduce "nervous cough" for 4/5 opportunities given occasional mod A over 2 sessions  Baseline:  Goal status: IN PROGRESS  3.  Pt will  utilize memory supports to aid recall on structured functional tasks (med management, etc) given occasional mod A over 2 sessions  Baseline:  Goal status: IN PROGRESS   LONG TERM GOALS: Target date: 07/04/2022  Pt will utilize word retrieval strategies to effectively relay work related information for 2/2 opportunities given occasional min A  Baseline:  Goal status: IN PROGRESS  2.  Pt will recall personally relevant information (medications, etc) with use of memory supports given occasional min A over 2 sessions  Baseline:  Goal status: IN PROGRESS  3.  Pt will demonstrate adequate cognitive ability to accurately perform functional tasks related to job with use of cognitive compensations given occasional min A Baseline:  Goal status: IN PROGRESS  ASSESSMENT:  CLINICAL IMPRESSION: Patient is a 88 y.o. male who was seen today for CVA in October 2023. Daughter, [name], provides past hx due to patient short term recall deficits. Pt was independent prior, including living alone and working part time. Baseline impulsivity endorsed by daughter, which was evidenced in fast, unsteady transfers today. Impaired short term recall and reduced awareness exhibited with limited awareness of current deficits or functional impact of his memory deficits observed. He currently lives with his daughter who is managing his medications and finances. Pt desires to return to work in some capacity. Recall benefited from repetition and cues this session. Occasional word finding reported and exhibited (albeit improved since hospitalization), with pt identifying 6/15 items on BNT. Descriptions, first letter cues, and written cues were intermittently effective. Hearing loss and changes in vision may have been contributing to patient performance this session. Pt would benefit from skilled ST intervention to maximize cognitive function and communication effectiveness to optimize patient return to baseline.   OBJECTIVE  IMPAIRMENTS: include attention, memory, awareness, executive functioning, and aphasia. These impairments are limiting patient from managing medications, managing appointments, managing finances, household responsibilities, ADLs/IADLs, and effectively communicating at home and in community. Factors affecting potential to achieve goals and functional outcome are ability to learn/carryover information. Patient will benefit from skilled SLP services to address above impairments and improve overall function.  REHAB POTENTIAL: Good  PLAN:  SLP FREQUENCY: 2x/week  SLP DURATION: 8 weeks  PLANNED INTERVENTIONS: Language facilitation, Cueing hierachy, Cognitive reorganization, Internal/external aids, Functional tasks, Multimodal communication approach, SLP instruction and feedback, Compensatory strategies, and Patient/family education  Su Monks, [place] 06/01/2022, 8:51 AM

## 2022-06-01 ENCOUNTER — Ambulatory Visit: Payer: Medicare Other | Admitting: Speech Pathology

## 2022-06-01 ENCOUNTER — Ambulatory Visit: Payer: Medicare Other | Admitting: Physical Therapy

## 2022-06-01 ENCOUNTER — Encounter: Payer: Self-pay | Admitting: Physical Therapy

## 2022-06-01 ENCOUNTER — Ambulatory Visit: Payer: Medicare Other | Admitting: Neurology

## 2022-06-01 VITALS — BP 148/65 | HR 73 | Ht 69.0 in | Wt 144.0 lb

## 2022-06-01 DIAGNOSIS — R278 Other lack of coordination: Secondary | ICD-10-CM | POA: Diagnosis not present

## 2022-06-01 DIAGNOSIS — M6281 Muscle weakness (generalized): Secondary | ICD-10-CM | POA: Diagnosis not present

## 2022-06-01 DIAGNOSIS — R2689 Other abnormalities of gait and mobility: Secondary | ICD-10-CM | POA: Diagnosis not present

## 2022-06-01 DIAGNOSIS — R2681 Unsteadiness on feet: Secondary | ICD-10-CM

## 2022-06-01 DIAGNOSIS — R413 Other amnesia: Secondary | ICD-10-CM | POA: Diagnosis not present

## 2022-06-01 DIAGNOSIS — R41841 Cognitive communication deficit: Secondary | ICD-10-CM

## 2022-06-01 DIAGNOSIS — R4701 Aphasia: Secondary | ICD-10-CM | POA: Diagnosis not present

## 2022-06-01 DIAGNOSIS — G3184 Mild cognitive impairment, so stated: Secondary | ICD-10-CM | POA: Diagnosis not present

## 2022-06-01 DIAGNOSIS — H53462 Homonymous bilateral field defects, left side: Secondary | ICD-10-CM

## 2022-06-01 DIAGNOSIS — I63432 Cerebral infarction due to embolism of left posterior cerebral artery: Secondary | ICD-10-CM | POA: Diagnosis not present

## 2022-06-01 DIAGNOSIS — I69351 Hemiplegia and hemiparesis following cerebral infarction affecting right dominant side: Secondary | ICD-10-CM | POA: Diagnosis not present

## 2022-06-01 DIAGNOSIS — I69318 Other symptoms and signs involving cognitive functions following cerebral infarction: Secondary | ICD-10-CM | POA: Diagnosis not present

## 2022-06-01 NOTE — Patient Instructions (Signed)
I had a long d/w patient and his daughter about his recent embolic stroke, memory loss and right sided peripheral vision loss,risk for recurrent stroke/TIAs, personally independently reviewed imaging studies and stroke evaluation results and answered questions.Continue aspirin 81 mg daily and clopidogrel 75 mg daily  for  1 more month and then stop aspirin and stay on clopidogrel alone for secondary stroke prevention and maintain strict control of hypertension with blood pressure goal below 130/90, diabetes with hemoglobin A1c goal below 6.5% and lipids with LDL cholesterol goal below 70 mg/dL. I also advised the patient to eat a healthy diet with plenty of whole grains, cereals, fruits and vegetables, exercise regularly and maintain ideal body weight . He was advised not to drive due to his vision loss and use a walker at all times for ambulation. Follow 30 day heart monitor results.I also advised him to increase participation in mentally challenging activities like solving crossword puzzles, playing bridge and sudoku discussed memory compensation strategies.  Followup in the future with my nurse practitioner in 3 months or call earlier if necessary. Stroke Prevention Some medical conditions and behaviors can lead to a higher chance of having a stroke. You can help prevent a stroke by eating healthy, exercising, not smoking, and managing any medical conditions you have. Stroke is a leading cause of functional impairment. Primary prevention is particularly important because a majority of strokes are first-time events. Stroke changes the lives of not only those who experience a stroke but also their family and other caregivers. How can this condition affect me? A stroke is a medical emergency and should be treated right away. A stroke can lead to brain damage and can sometimes be life-threatening. If a person gets medical treatment right away, there is a better chance of surviving and recovering from a  stroke. What can increase my risk? The following medical conditions may increase your risk of a stroke: Cardiovascular disease. High blood pressure (hypertension). Diabetes. High cholesterol. Sickle cell disease. Blood clotting disorders (hypercoagulable state). Obesity. Sleep disorders (obstructive sleep apnea). Other risk factors include: Being older than age 36. Having a history of blood clots, stroke, or mini-stroke (transient ischemic attack, TIA). Genetic factors, such as race, ethnicity, or a family history of stroke. Smoking cigarettes or using other tobacco products. Taking birth control pills, especially if you also use tobacco. Heavy use of alcohol or drugs, especially cocaine and methamphetamine. Physical inactivity. What actions can I take to prevent this? Manage your health conditions High cholesterol levels. Eating a healthy diet is important for preventing high cholesterol. If cholesterol cannot be managed through diet alone, you may need to take medicines. Take any prescribed medicines to control your cholesterol as told by your health care provider. Hypertension. To reduce your risk of stroke, try to keep your blood pressure below 130/80. Eating a healthy diet and exercising regularly are important for controlling blood pressure. If these steps are not enough to manage your blood pressure, you may need to take medicines. Take any prescribed medicines to control hypertension as told by your health care provider. Ask your health care provider if you should monitor your blood pressure at home. Have your blood pressure checked every year, even if your blood pressure is normal. Blood pressure increases with age and some medical conditions. Diabetes. Eating a healthy diet and exercising regularly are important parts of managing your blood sugar (glucose). If your blood sugar cannot be managed through diet and exercise, you may need to take medicines. Take any  prescribed  medicines to control your diabetes as told by your health care provider. Get evaluated for obstructive sleep apnea. Talk to your health care provider about getting a sleep evaluation if you snore a lot or have excessive sleepiness. Make sure that any other medical conditions you have, such as atrial fibrillation or atherosclerosis, are managed. Nutrition Follow instructions from your health care provider about what to eat or drink to help manage your health condition. These instructions may include: Reducing your daily calorie intake. Limiting how much salt (sodium) you use to 1,500 milligrams (mg) each day. Using only healthy fats for cooking, such as olive oil, canola oil, or sunflower oil. Eating healthy foods. You can do this by: Choosing foods that are high in fiber, such as whole grains, and fresh fruits and vegetables. Eating at least 5 servings of fruits and vegetables a day. Try to fill one-half of your plate with fruits and vegetables at each meal. Choosing lean protein foods, such as lean cuts of meat, poultry without skin, fish, tofu, beans, and nuts. Eating low-fat dairy products. Avoiding foods that are high in sodium. This can help lower blood pressure. Avoiding foods that have saturated fat, trans fat, and cholesterol. This can help prevent high cholesterol. Avoiding processed and prepared foods. Counting your daily carbohydrate intake.  Lifestyle If you drink alcohol: Limit how much you have to: 0-1 drink a day for women who are not pregnant. 0-2 drinks a day for men. Know how much alcohol is in your drink. In the [place] one drink equals one 12 oz bottle of beer (344m), one 5 oz glass of wine (1433m, or one 1 oz glass of hard liquor (4440m Do not use any products that contain nicotine or tobacco. These products include cigarettes, chewing tobacco, and vaping devices, such as e-cigarettes. If you need help quitting, ask your health care provider. Avoid secondhand  smoke. Do not use drugs. Activity  Try to stay at a healthy weight. Get at least 30 minutes of exercise on most days, such as: Fast walking. Biking. Swimming. Medicines Take over-the-counter and prescription medicines only as told by your health care provider. Aspirin or blood thinners (antiplatelets or anticoagulants) may be recommended to reduce your risk of forming blood clots that can lead to stroke. Avoid taking birth control pills. Talk to your health care provider about the risks of taking birth control pills if: You are over 35 45ars old. You smoke. You get very bad headaches. You have had a blood clot. Where to find more information American Stroke Association: www.strokeassociation.org Get help right away if: You or a loved one has any symptoms of a stroke. "BE FAST" is an easy way to remember the main warning signs of a stroke: B - Balance. Signs are dizziness, sudden trouble walking, or loss of balance. E - Eyes. Signs are trouble seeing or a sudden change in vision. F - Face. Signs are sudden weakness or numbness of the face, or the face or eyelid drooping on one side. A - Arms. Signs are weakness or numbness in an arm. This happens suddenly and usually on one side of the body. S - Speech. Signs are sudden trouble speaking, slurred speech, or trouble understanding what people say. T - Time. Time to call emergency services. Write down what time symptoms started. You or a loved one has other signs of a stroke, such as: A sudden, severe headache with no known cause. Nausea or vomiting. Seizure. These symptoms may represent a serious  problem that is an emergency. Do not wait to see if the symptoms will go away. Get medical help right away. Call your local emergency services (911 in the [place] Do not drive yourself to the hospital. Summary You can help to prevent a stroke by eating healthy, exercising, not smoking, limiting alcohol intake, and managing any medical conditions  you may have. Do not use any products that contain nicotine or tobacco. These include cigarettes, chewing tobacco, and vaping devices, such as e-cigarettes. If you need help quitting, ask your health care provider. Remember "BE FAST" for warning signs of a stroke. Get help right away if you or a loved one has any of these signs. This information is not intended to replace advice given to you by your health care provider. Make sure you discuss any questions you have with your health care provider. Document Revised: 12/22/2019 Document Reviewed: 12/22/2019 Elsevier Patient Education  [place].

## 2022-06-01 NOTE — Progress Notes (Signed)
[facility] [address] [place]. [place] [address] [id number]       OFFICE CONSULT NOTE  Mr. Adam Arellano Date of Birth:  Nov 13, 1931 Medical Record Number:  [id number]   Referring MD:  Risa Grill Pa-c  Reason for Referral:  Stroke  HPI: Adam Arellano is a 88 year old Caucasian male seen today for initial office consultation visit for stroke.  He is accompanied by his daughter Adam Arellano history is provided by them and review of electronic medical records.  I personally reviewed pertinent available imaging films in PACS.Adam Arellano is a 88 y.o. male with past medical history of transient global amnesia in 2009, hypertension, hyperlipidemia, coronary artery disease with NSTEMI status post multiple PCI's, prostate cancer status post radical prostatectomy, severe mitral regurgitation status post TEER ( 08/19/2020). Patient doesn't remember what happened yesterday.  Per daughter at bedside, she spoke with patient on Saturday 03/25/2019.  Around 4 PM when he was his usual self.  Yesterday afternoon Multifol patient initially did not pick up and when he did pick up he started confused.  Therefore daughter called EMS.  When EMS went to his house, he was laying in bed had some bruises and urinary incontinence.  He was brought to [facility], ED and admitted for further work-up.  CT head was done showed acute stroke and therefore neurology was consulted on 03/17/2022.  MRI scan showed acute left occipital infarct and MR angiogram showed occlusion of the left P2 posterior cerebral artery with potentially severe proximal right A2 anterior cerebral artery stenosis as well.  3 mm outpouching at left MCA bifurcation possible aneurysm.  Transthoracic echo showed ejection fraction of 55 to 60%.  LDL cholesterol was 69 mg percent.  Hemoglobin A1c was 5.5.  Patient is currently wearing a 30-day heart monitor to look for paroxysmal A-fib.  Patient was transferred to inpatient rehab and has  done well and has been discharged home now.  Patient states she still has some peripheral vision loss but it appears to be improving.  He is currently doing outpatient physical occupational and speech therapy.  His walking has improved.  He is living at home with his daughter and he has 24-hour care at home.  He is independent mostly in activities of daily living but needs some help with his meds.  He does also have macular degeneration which may also be responsible for his poor vision.  Patient had some mild memory difficulties prior to the stroke which now appear to have progressed.  He is having more cognitive difficulties.  He remains on aspirin and Plavix which is tolerating well with only minor bruising and no bleeding.  His blood pressure is under good control today it is 148/65. ROS:   14 system review of systems is positive for memory loss, confusion, imbalance, falls all other systems negative  PMH:  Past Medical History:  Diagnosis Date   Asthma    a. Childhood.   Coronary artery disease    a. NSTEMI s/p DES to dRCA 2007. b. Angina 07/2009: s/p DESx2 to mid RCA for ISR and prox RCA, EF 65%. c. Stress test 05/2013: low risk, no evidence of ischemia, EF 61%.   Dyslipidemia    GERD (gastroesophageal reflux disease)    History of kidney stones    Hypertension    Peripheral vascular disease (HCC)    Prostate cancer ([place])    a. s/p radical prostatectomy.   PVC's (premature ventricular contractions)    S/P mitral valve clip  implantation 08/19/2020   s/p MitraClip implantation with a XTW by Dr. Burt Knack    Transient global amnesia    a. Adm 2009: imaging nonacute, had resolved.    Social History:  Social History   Socioeconomic History   Marital status: Married    Spouse name: Not on file   Number of children: Not on file   Years of education: Not on file   Highest education level: Not on file  Occupational History   Not on file  Tobacco Use   Smoking status: Former    Types:  Cigarettes    Quit date: 1980    Years since quitting: 44.0   Smokeless tobacco: Never   Tobacco comments:    Smoked for 15 years  Vaping Use   Vaping Use: Never used  Substance and Sexual Activity   Alcohol use: Not Currently    Comment: rare   Drug use: No   Sexual activity: Not on file  Other Topics Concern   Not on file  Social History Narrative   Not on file   Social Determinants of Health   Financial Resource Strain: Not on file  Food Insecurity: No Food Insecurity (04/03/2022)   Hunger Vital Sign    Worried About Running Out of Food in the Last Year: Never true    Ran Out of Food in the Last Year: Never true  Transportation Needs: No Transportation Needs (04/03/2022)   PRAPARE - Hydrologist (Medical): No    Lack of Transportation (Non-Medical): No  Physical Activity: Not on file  Stress: Not on file  Social Connections: Not on file  Intimate Partner Violence: Not At Risk (04/03/2022)   Humiliation, Afraid, Rape, and Kick questionnaire    Fear of Current or Ex-Partner: No    Emotionally Abused: No    Physically Abused: No    Sexually Abused: No    Medications:   Current Outpatient Medications on File Prior to Visit  Medication Sig Dispense Refill   acetaminophen (TYLENOL) 325 MG tablet Take 1-2 tablets (325-650 mg total) by mouth every 4 (four) hours as needed for mild pain.     aspirin EC 81 MG tablet Take 81 mg by mouth daily. Swallow whole.     ciprofloxacin-dexamethasone (CIPRODEX) OTIC suspension Place 4 drops into the left ear 2 (two) times daily as needed (ear infection).     clopidogrel (PLAVIX) 75 MG tablet Take 1 tablet (75 mg total) by mouth daily. 90 tablet 3   docusate sodium (COLACE) 100 MG capsule Take 1 capsule (100 mg total) by mouth 2 (two) times daily. 10 capsule 0   FIBER PO Take 1 capsule by mouth daily.     furosemide (LASIX) 20 MG tablet Take 1 tablet (20 mg total) by mouth daily. 90 tablet 3   gabapentin  (NEURONTIN) 100 MG capsule Take 1 capsule (100 mg total) by mouth at bedtime. 30 capsule 0   losartan (COZAAR) 25 MG tablet TAKE 1 TABLET DAILY 90 tablet 3   Menthol-Methyl Salicylate (MUSCLE RUB) 10-15 % CREA Apply 1 Application topically 2 (two) times daily as needed for muscle pain.  0   metoprolol tartrate (LOPRESSOR) 25 MG tablet Take 25 mg by mouth 2 (two) times daily.     Multiple Vitamin (MULTIVITAMIN WITH MINERALS) TABS tablet Take 1 tablet by mouth daily.     nitroGLYCERIN (NITROSTAT) 0.4 MG SL tablet PLACE 1 TABLET UNDER THE TONGUE AT ONSET OF CHEST PAIN EVERY 5  MINTUES UP TO 3 TIMES AS NEEDED 25 tablet 3   polyethylene glycol (MIRALAX / GLYCOLAX) 17 g packet Take 17 g by mouth daily as needed for mild constipation. 14 each 0   Polyvinyl Alcohol-Povidone (REFRESH OP) Place 1 drop into both eyes daily as needed (dry eye).     No current facility-administered medications on file prior to visit.    Allergies:   Allergies  Allergen Reactions   Simvastatin Other (See Comments)    MUSCLE ACHES REAL BAD   Atorvastatin     Other reaction(s): stiffness, decreased energy   Lisinopril Cough   Rosuvastatin     Other reaction(s): weakness    Physical Exam General: well developed, well nourished pleasant elderly Caucasian male, seated, in no evident distress Head: head normocephalic and atraumatic.   Neck: supple with no carotid or supraclavicular bruits Cardiovascular: regular rate and rhythm, no murmurs Musculoskeletal: no deformity Skin:  no rash/petichiae Vascular:  Normal pulses all extremities  Neurologic Exam Mental Status: Awake and fully alert. Oriented to place and time. Recent and remote memory intact. Attention span, concentration and fund of knowledge appropriate. Mood and affect appropriate.  Diminished attention, registration and recall.  Diminished recall 0/3.  Able to name only 4 animals which can walk on 4 legs.  Clock drawing 2/4. Cranial Nerves: Fundoscopic exam  reveals sharp disc margins. Pupils equal, briskly reactive to light. Extraocular movements full without nystagmus. Visual fields showed partial right homonymous hemianopsia to confrontation. Hearing intact. Facial sensation intact. Face, tongue, palate moves normally and symmetrically.  Motor: Normal bulk and tone. Normal strength in all tested extremity muscles. Sensory.: intact to touch , pinprick , position and vibratory sensation.  Coordination: Rapid alternating movements normal in all extremities. Finger-to-nose and heel-to-shin performed accurately bilaterally. Gait and Station: Arises from chair without difficulty. Stance is normal. Gait demonstrates normal stride length and balance .  Uses a walker able to heel, toe and tandem walk with difficulty.  Reflexes: 1+ and symmetric. Toes downgoing.   NIHSS  2 Modified Rankin  3   ASSESSMENT: 88 year old Caucasian male with left posterior cerebral artery embolic infarct and October 2023 cryptogenic etiology.  He has mild cognitive impairment and mild right-sided peripheral vision loss.  Vascular risk factors of hypertension, hyperlipidemia and intracranial stenosis.     PLAN: I had a long d/w patient and his daughter about his recent embolic stroke, memory loss and right sided peripheral vision loss,risk for recurrent stroke/TIAs, personally independently reviewed imaging studies and stroke evaluation results and answered questions.Continue aspirin 81 mg daily and clopidogrel 75 mg daily  for  1 more month and then stop aspirin and stay on clopidogrel alone for secondary stroke prevention and maintain strict control of hypertension with blood pressure goal below 130/90, diabetes with hemoglobin A1c goal below 6.5% and lipids with LDL cholesterol goal below 70 mg/dL. I also advised the patient to eat a healthy diet with plenty of whole grains, cereals, fruits and vegetables, exercise regularly and maintain ideal body weight . He was advised not to  drive due to his vision loss and use a walker at all times for ambulation. Follow 30 day heart monitor results.I also advised him to increase participation in mentally challenging activities like solving crossword puzzles, playing bridge and sudoku discussed memory compensation strategies.  Followup in the future with my nurse practitioner in 3 months or call earlier if necessary.  Greater than 50% time during the 45-minute consultation with the counseling and coordination of care  about embolic stroke, right-sided vision loss as well as memory loss and mild cognitive impairment and answering questions.  Antony Contras, MD Note: This document was prepared with digital dictation and possible smart phrase technology. Any transcriptional errors that result from this process are unintentional.

## 2022-06-01 NOTE — Therapy (Signed)
OUTPATIENT PHYSICAL THERAPY NEURO TREATMENT   Patient Name: Adam Arellano MRN: [id number] DOB:[birth year], 88 y.o., male Today's Date: 06/01/2022  PCP: Adam Manes, MD REFERRING PROVIDER: Cathlyn Parsons, PA-C  END OF SESSION:  PT End of Session - 06/01/22 0852     Visit Number 5    Number of Visits 17    Date for PT Re-Evaluation 07/07/22    Authorization Type [employer]    Authorization Time Period 05-09-22 - 07-07-22    PT Start Time 0803    PT Stop Time 0844    PT Time Calculation (min) 41 min    Equipment Utilized During Treatment Gait belt    Activity Tolerance Patient tolerated treatment well    Behavior During Therapy WFL for tasks assessed/performed               Past Medical History:  Diagnosis Date   Asthma    a. Childhood.   Coronary artery disease    a. NSTEMI s/p DES to dRCA 2007. b. Angina 07/2009: s/p DESx2 to mid RCA for ISR and prox RCA, EF 65%. c. Stress test 05/2013: low risk, no evidence of ischemia, EF 61%.   Dyslipidemia    GERD (gastroesophageal reflux disease)    History of kidney stones    Hypertension    Peripheral vascular disease (HCC)    Prostate cancer ([place])    a. s/p radical prostatectomy.   PVC's (premature ventricular contractions)    S/P mitral valve clip implantation 08/19/2020   s/p MitraClip implantation with a XTW by Dr. Burt Arellano    Transient global amnesia    a. Adm 2009: imaging nonacute, had resolved.   Past Surgical History:  Procedure Laterality Date   CARDIAC CATHETERIZATION     COLONOSCOPY     CORONARY STENT INTERVENTION N/A 08/06/2020   Procedure: CORONARY STENT INTERVENTION;  Surgeon: Adam Mocha, MD;  Location: [facility];  Service: Cardiovascular;  Laterality: N/A;   EYE SURGERY Bilateral    cataracts removed   MASTOIDECTOMY     L ear   MITRAL VALVE REPAIR     MITRAL VALVE REPAIR N/A 08/19/2020   Procedure: MITRAL VALVE REPAIR;  Surgeon: Adam Mocha, MD;  Location: [facility];   Service: Cardiovascular;  Laterality: N/A;   PROSTATECTOMY     RIGHT/LEFT HEART CATH AND CORONARY ANGIOGRAPHY N/A 08/06/2020   Procedure: RIGHT/LEFT HEART CATH AND CORONARY ANGIOGRAPHY;  Surgeon: Adam Mocha, MD;  Location: [facility];  Service: Cardiovascular;  Laterality: N/A;   TEE WITHOUT CARDIOVERSION N/A 08/06/2020   Procedure: TRANSESOPHAGEAL ECHOCARDIOGRAM (TEE);  Surgeon: Adam Klein, MD;  Location: [facility];  Service: Cardiovascular;  Laterality: N/A;   TEE WITHOUT CARDIOVERSION  08/19/2020   TEE WITHOUT CARDIOVERSION N/A 08/19/2020   Procedure: TRANSESOPHAGEAL ECHOCARDIOGRAM (TEE);  Surgeon: Adam Mocha, MD;  Location: [facility];  Service: Cardiovascular;  Laterality: N/A;   Patient Active Problem List   Diagnosis Date Noted   Acute ischemic left posterior cerebral artery (PCA) stroke ([place]) 04/07/2022   Acute ischemic stroke ([place]) 04/02/2022   Hypokalemia 04/02/2022   Fall at home, initial encounter 04/02/2022   Mitral valve disorder 01/16/2022   Paresthesia 01/16/2022   Exudative age-related macular degeneration of left eye with active choroidal neovascularization ([place]) 11/14/2021   Advanced nonexudative age-related macular degeneration of right eye without subfoveal involvement 11/14/2021   Advanced nonexudative age-related macular degeneration of left eye without subfoveal involvement 11/14/2021   S/P mitral valve clip  implantation 08/19/2020   Prostate cancer ([place])    PVC's (premature ventricular contractions)    Peripheral vascular disease (HCC)    Coronary artery disease    Nonrheumatic mitral valve regurgitation    Anemia in chronic kidney disease 04/24/2020   Stage 3b chronic kidney disease ([place]) 04/24/2020   History of malignant neoplasm of prostate 04/24/2020   Hypertensive renal disease 04/24/2020   Otorrhea of left ear 01/12/2020   Tympanic membrane perforation, left 01/12/2020   Acute non-recurrent sinusitis 08/07/2018   Tympanic  membrane perforation, marginal, right 08/07/2018   Cholesteatoma of attic of ear, left 01/23/2017   Conductive hearing loss of both ears 01/23/2017   Laryngopharyngeal reflux (LPR) 01/23/2017   Perennial allergic rhinitis 01/23/2017   Sensorineural hearing loss (SNHL), bilateral 08/17/2015   Bilateral chronic secretory otitis media 08/09/2015   Chronic swimmer's ear of both sides 08/09/2015   History of colonic polyps 08/09/2015   Dysfunction of both eustachian tubes 07/19/2015   Primary hypertension 02/27/2014   Hyperlipidemia 06/10/2013    ONSET DATE: 04-02-22  REFERRING DIAG:  Diagnosis  I63.9 (ICD-10-CM) - CVA (cerebral vascular accident) ([place])    THERAPY DIAG:  Unsteadiness on feet  Other abnormalities of gait and mobility  Muscle weakness (generalized)  Other lack of coordination  Rationale for Evaluation and Treatment: Rehabilitation  SUBJECTIVE:                                                                                                                                                                                             SUBJECTIVE STATEMENT: Patient prefers to go by Adam Arellano.  Pt's daughter, Adam Arellano,  reports he fell when getting out of car last Thursday (does not know what happened that caused him to fall) - is sore from the fall but did not get injured; because of the fall he is now using the RW again; states he doesn't use it in the house but uses it when he goes out (has actually not been out a lot this past week due to holidays and the rainy weather)  Pt accompanied by: daughter, Adam Arellano  PERTINENT HISTORY: Brief HPI:   Adam Arellano is a 88 y.o. male  who was found down at his home my his neighbor on 04/02/2022. He presented to the [facility] ED with right-sided weakness, mild dysarthria and right hemianopsia on evaluation. Imaging was significant for left PICA infarct. No apparent injuries. Neurology consulted and resumed his Plavix and started  aspirin 81 mg for 3 months then aspirin alone. Outside of window for tenecteplase and no evidence of LVO. Recommended resuming home statin; however, per PCP  notes, the patient has stopped his home Crestor due to "aches".   Discharge Diagnoses:  Principal Problem:   Acute ischemic left posterior cerebral artery (PCA) stroke (HCC) Active Problems:   Primary hypertension   Stage 3b chronic kidney disease (HCC) Functional deficits secondary to acute ischemic left posterior cerebral artery stroke Multiple skin tears Hyperlipidemia Coronary artery disease Prostate cancer Mitral regurgitation GERD Diplopia consipation     PAIN:  Are you having pain? No - pt reports no pain or soreness at current time  PRECAUTIONS: Fall  PATIENT GOALS: "Get back to being myself again"  OBJECTIVE:   There were no vitals filed for this visit.  with gait belt   Gait:  Pt gait trained 115' x 3 laps intermittently during PT session without device;  1st lap 115' with CGA no device;  2nd lap - pt performed ball toss for initial 67' of 115' distance - mod assist needed initially for recovery of LOB with catching ball;  pt able to pick up ball off floor x 1 with CGA without LOB;  pt improved with maintaining balance with ball toss after approx. 20'  3rd rep - pt carried cup filled 1/2 way with water and also bowl of pegs (approx. 1/8 full) for dynamic gait training; pt had no difficulty and no spillage with carrying objects while ambulating    NMR: Pt performed rockerboard activity inside // bars - 10 reps with bil. UE support on // bars; 10 reps with RUE support only and then 10 reps without any UE support with CGA for safety; pt stood on rockerboard - held it steady and performed horizontal and vertical head turns 5 reps each with CGA  Tap ups to 1st step without UE support on handrails 5 reps each leg; tap ups to 2nd step with min to mod assist for recovery of LOB 5 reps each foot with cues to lift high for  clearance  Pt performed 4 square stepping (yardsticks placed on floor "+") - pt performed 4 reps stepping with min to mod assist - pt had most difficulty with stepping backward  Performed SLS and coordination activity - touching 3 balance bubbles - named specific colors but pt did not touch correct color as in order called - cognition appeared to impact this activity  Stepping over and back of black balance beam 5 reps each LE with cues to step big and lift high for clearance - min assist needed for recovery of LOB  TherEx: 2# weight used on LLE - pt performed Lt hip flexion 10 reps with knee extended with min HHA for balance; hip flexion with 2# LLE with knee flexed (1/2 march)  Sit to stand from mat - 3 reps without UE support with SBA; feet on Airex for 1 rep with CGA without UE support     PATIENT EDUCATION: Education details: Recommended use of FWW for safety at this time as SPC use was not ideal for patient Person educated: Patient and Child(ren) Education method: Explanation Education comprehension: verbalized understanding  HOME EXERCISE PROGRAM: Access Code: [email or web address] URL: [email or web address] Date: 05/16/2022 Prepared by: Ethelene Browns  Exercises - Standing March with Counter Support  - 1 x daily - 7 x weekly - 1 sets - 10 reps - Standing Hip Flexion with Counter Support  - 1 x daily - 7 x weekly - 1 sets - 10 reps - Standing Hip Extension with Unilateral Counter Support  - 1 x daily - 7 x weekly - 1 sets -  10 reps - Standing Hip Abduction with Counter Support  - 1 x daily - 7 x weekly - 1 sets - 10 reps - Standing Romberg to 1/4 Tandem Stance  - 1 x daily - 7 x weekly - 1 sets - 1-2 reps - 15 secs hold - Romberg Stance  - 1 x daily - 7 x weekly - 1 sets - 1-2 reps - 15 sec hold hold - Single Leg Stance with Arms Out  - 1 x daily - 7 x weekly - 1 sets - 1-2 reps - 10 sec hold - Sit to Stand Without Arm Support  - 1 x daily - 7 x weekly - 1 sets - 10  reps   GOALS: Goals reviewed with patient? Yes  SHORT TERM GOALS: Target date: 06-09-21  Improve Berg score to >/= 40/56 to demo improved balance and to decrease fall risk. Baseline: 35/56 Goal status: INITIAL  2.  Pt will improve TUG score without use of RW from 15.16 secs to </= 14 secs for reduced fall risk. Baseline: 15.16 secs Goal status: INITIAL  3.  Amb. Household distances modified independently without use of RW. Baseline:  Goal status: INITIAL  4.  Amb. 500' without device with CGA on flat even, uneven surfaces for increased community accessibility. Baseline:  Goal status: INITIAL  5.  Independent in HEP for balance and strengthening exercises.  Baseline:  Goal status: INITIAL   LONG TERM GOALS: Target date: 07-07-21  Improve FOTO score by at least 10 points to demo improvement in mobility. Baseline:  Goal status: INITIAL  Improve Berg score to >/= 46/56 to demo improved balance and to decrease fall risk. Baseline: 35/56 Goal status: INITIAL  3.   Pt will improve TUG score without use of RW from 15.16 secs to </= 12 secs for reduced fall risk. Baseline: 15.16 secs Goal status: INITIAL  4.   Amb. 800' without device with supervision on flat even, uneven surfaces for increased community accessibility. Baseline:  Goal status: INITIAL  5.  Pt will perform floor to stand transfer with UE support with SBA.  Baseline:  Goal status: INITIAL  6.  Independent in updated HEP for balance and strengthening exs.  Baseline:  Goal status: INITIAL   ASSESSMENT:  CLINICAL IMPRESSION: PT session focused on dynamic gait training with no device used during session and also on balance activities to improve SLS on each leg.  Pt's LLE appears to be slightly weaker than RLE with pt having more difficulty lifting Lt leg to tap or step over/back of object and had LOB intermittently with SLS activities.  Pt had much difficulty initially with tossing/catching ball while  ambulating - needed mod assist for recovery of LOB.  Continue with POC.  OBJECTIVE IMPAIRMENTS: decreased activity tolerance, decreased balance, decreased knowledge of use of DME, decreased strength, and impaired vision/preception.   ACTIVITY LIMITATIONS: carrying, bending, standing, squatting, transfers, and locomotion level  PARTICIPATION LIMITATIONS: meal prep, cleaning, laundry, driving, shopping, community activity, and yard work  PERSONAL FACTORS: Behavior pattern and 1-2 comorbidities: diplopia and mild impulsivity  are also affecting patient's functional outcome.   REHAB POTENTIAL: Good  CLINICAL DECISION MAKING: Evolving/moderate complexity  EVALUATION COMPLEXITY: Moderate  PLAN:  PT FREQUENCY: 2x/week  PT DURATION: 8 weeks  PLANNED INTERVENTIONS: Therapeutic exercises, Therapeutic activity, Neuromuscular re-education, Balance training, Gait training, Patient/Family education, Self Care, Stair training, and DME instructions  PLAN FOR NEXT SESSION:  Begin checking STG's: dynamic gait without device, balance training,  functional  strengthening, coordination, ankle weights for input, trial U step or FWW with band to promote improved stride length, scanning tasks to improve safety around obstacle navigation   [facility], PT  06/01/2022, 9:27 AM

## 2022-06-06 ENCOUNTER — Ambulatory Visit: Payer: Medicare Other | Admitting: Occupational Therapy

## 2022-06-06 ENCOUNTER — Encounter: Payer: Self-pay | Admitting: Occupational Therapy

## 2022-06-06 ENCOUNTER — Ambulatory Visit: Payer: Medicare Other | Attending: Physician Assistant | Admitting: Physical Therapy

## 2022-06-06 ENCOUNTER — Ambulatory Visit: Payer: Medicare Other | Admitting: Speech Pathology

## 2022-06-06 ENCOUNTER — Encounter: Payer: Self-pay | Admitting: Physical Therapy

## 2022-06-06 DIAGNOSIS — R4701 Aphasia: Secondary | ICD-10-CM

## 2022-06-06 DIAGNOSIS — M6281 Muscle weakness (generalized): Secondary | ICD-10-CM | POA: Insufficient documentation

## 2022-06-06 DIAGNOSIS — I69351 Hemiplegia and hemiparesis following cerebral infarction affecting right dominant side: Secondary | ICD-10-CM

## 2022-06-06 DIAGNOSIS — I69318 Other symptoms and signs involving cognitive functions following cerebral infarction: Secondary | ICD-10-CM | POA: Insufficient documentation

## 2022-06-06 DIAGNOSIS — R278 Other lack of coordination: Secondary | ICD-10-CM | POA: Insufficient documentation

## 2022-06-06 DIAGNOSIS — R41842 Visuospatial deficit: Secondary | ICD-10-CM | POA: Diagnosis present

## 2022-06-06 DIAGNOSIS — R41841 Cognitive communication deficit: Secondary | ICD-10-CM | POA: Diagnosis present

## 2022-06-06 DIAGNOSIS — R2689 Other abnormalities of gait and mobility: Secondary | ICD-10-CM | POA: Insufficient documentation

## 2022-06-06 DIAGNOSIS — R2681 Unsteadiness on feet: Secondary | ICD-10-CM | POA: Insufficient documentation

## 2022-06-06 NOTE — Therapy (Signed)
OUTPATIENT PHYSICAL THERAPY NEURO TREATMENT   Patient Name: Adam Arellano MRN: [id number] DOB:Dec 03, 1931, 89 y.o., male Today's Date: 06/06/2022  PCP: Adam Manes, MD REFERRING PROVIDER: Cathlyn Parsons, PA-C  END OF SESSION:  PT End of Session - 06/06/22 0926     Visit Number 6    Number of Visits 17    Date for PT Re-Evaluation 07/07/22    Authorization Type [employer] Medicare    Authorization Time Period 05-09-22 - 07-07-22    PT Start Time 0925    PT Stop Time 1007    PT Time Calculation (min) 42 min    Equipment Utilized During Treatment Gait belt    Activity Tolerance Patient tolerated treatment well    Behavior During Therapy WFL for tasks assessed/performed               Past Medical History:  Diagnosis Date   Asthma    a. Childhood.   Coronary artery disease    a. NSTEMI s/p DES to dRCA 2007. b. Angina 07/2009: s/p DESx2 to mid RCA for ISR and prox RCA, EF 65%. c. Stress test 05/2013: low risk, no evidence of ischemia, EF 61%.   Dyslipidemia    GERD (gastroesophageal reflux disease)    History of kidney stones    Hypertension    Peripheral vascular disease (HCC)    Prostate cancer ([place])    a. s/p radical prostatectomy.   PVC's (premature ventricular contractions)    S/P mitral valve clip implantation 08/19/2020   s/p MitraClip implantation with a XTW by Dr. Burt Arellano    Transient global amnesia    a. Adm 2009: imaging nonacute, had resolved.   Past Surgical History:  Procedure Laterality Date   CARDIAC CATHETERIZATION     COLONOSCOPY     CORONARY STENT INTERVENTION N/A 08/06/2020   Procedure: CORONARY STENT INTERVENTION;  Surgeon: Adam Mocha, MD;  Location: [facility];  Service: Cardiovascular;  Laterality: N/A;   EYE SURGERY Bilateral    cataracts removed   MASTOIDECTOMY     L ear   MITRAL VALVE REPAIR     MITRAL VALVE REPAIR N/A 08/19/2020   Procedure: MITRAL VALVE REPAIR;  Surgeon: Adam Mocha, MD;  Location: [facility];   Service: Cardiovascular;  Laterality: N/A;   PROSTATECTOMY     RIGHT/LEFT HEART CATH AND CORONARY ANGIOGRAPHY N/A 08/06/2020   Procedure: RIGHT/LEFT HEART CATH AND CORONARY ANGIOGRAPHY;  Surgeon: Adam Mocha, MD;  Location: [facility];  Service: Cardiovascular;  Laterality: N/A;   TEE WITHOUT CARDIOVERSION N/A 08/06/2020   Procedure: TRANSESOPHAGEAL ECHOCARDIOGRAM (TEE);  Surgeon: Adam Klein, MD;  Location: [facility];  Service: Cardiovascular;  Laterality: N/A;   TEE WITHOUT CARDIOVERSION  08/19/2020   TEE WITHOUT CARDIOVERSION N/A 08/19/2020   Procedure: TRANSESOPHAGEAL ECHOCARDIOGRAM (TEE);  Surgeon: Adam Mocha, MD;  Location: [facility];  Service: Cardiovascular;  Laterality: N/A;   Patient Active Problem List   Diagnosis Date Noted   Acute ischemic left posterior cerebral artery (PCA) stroke ([place]) 04/07/2022   Acute ischemic stroke ([place]) 04/02/2022   Hypokalemia 04/02/2022   Fall at home, initial encounter 04/02/2022   Mitral valve disorder 01/16/2022   Paresthesia 01/16/2022   Exudative age-related macular degeneration of left eye with active choroidal neovascularization ([place]) 11/14/2021   Advanced nonexudative age-related macular degeneration of right eye without subfoveal involvement 11/14/2021   Advanced nonexudative age-related macular degeneration of left eye without subfoveal involvement 11/14/2021   S/P mitral valve clip  implantation 08/19/2020   Prostate cancer ([place])    PVC's (premature ventricular contractions)    Peripheral vascular disease (HCC)    Coronary artery disease    Nonrheumatic mitral valve regurgitation    Anemia in chronic kidney disease 04/24/2020   Stage 3b chronic kidney disease ([place]) 04/24/2020   History of malignant neoplasm of prostate 04/24/2020   Hypertensive renal disease 04/24/2020   Otorrhea of left ear 01/12/2020   Tympanic membrane perforation, left 01/12/2020   Acute non-recurrent sinusitis 08/07/2018   Tympanic  membrane perforation, marginal, right 08/07/2018   Cholesteatoma of attic of ear, left 01/23/2017   Conductive hearing loss of both ears 01/23/2017   Laryngopharyngeal reflux (LPR) 01/23/2017   Perennial allergic rhinitis 01/23/2017   Sensorineural hearing loss (SNHL), bilateral 08/17/2015   Bilateral chronic secretory otitis media 08/09/2015   Chronic swimmer's ear of both sides 08/09/2015   History of colonic polyps 08/09/2015   Dysfunction of both eustachian tubes 07/19/2015   Primary hypertension 02/27/2014   Hyperlipidemia 06/10/2013    ONSET DATE: 04-02-22  REFERRING DIAG:  Diagnosis  I63.9 (ICD-10-CM) - CVA (cerebral vascular accident) ([place])    THERAPY DIAG:  Unsteadiness on feet  Muscle weakness (generalized)  Other lack of coordination  Other abnormalities of gait and mobility  Rationale for Evaluation and Treatment: Rehabilitation  SUBJECTIVE:                                                                                                                                                                                             SUBJECTIVE STATEMENT: Patient prefers to go by [name].  Patient reports that he is doing well. Denies falls/medication changes. Patient arrives to session without walker and son-in-law Adam Arellano says he was told he didn't need to use it anymore. Cannot recall which provider gave that information.   Pt accompanied by: son-in-law, Adam Arellano  PERTINENT HISTORY: Brief HPI:   Adam Arellano is a 89 y.o. male  who was found down at his home my his neighbor on 04/02/2022. He presented to [facility] ED with right-sided weakness, mild dysarthria and right hemianopsia on evaluation. Imaging was significant for left PICA infarct. No apparent injuries. Neurology consulted and resumed his Plavix and started aspirin 81 mg for 3 months then aspirin alone. Outside of window for tenecteplase and no evidence of LVO. Recommended resuming home statin; however, per  PCP notes, the patient has stopped his home Crestor due to "aches".   Discharge Diagnoses:  Principal Problem:   Acute ischemic left posterior cerebral artery (PCA) stroke (HCC) Active Problems:   Primary hypertension   Stage 3b chronic  kidney disease ([place]) Functional deficits secondary to acute ischemic left posterior cerebral artery stroke Multiple skin tears Hyperlipidemia Coronary artery disease Prostate cancer Mitral regurgitation GERD Diplopia consipation     PAIN:  Are you having pain? No - pt reports no pain or soreness at current time  PRECAUTIONS: Fall  PATIENT GOALS: "Get back to being myself again"  OBJECTIVE:   There were no vitals filed for this visit.  with gait belt   NMR:  Overground gait working on dynamic balance with CGA and no AD - 230 feet and tolerated for 2 minutes and 18" -noted increased forward flexion and increased crouch gait with increased distance Dynamic stepping semi circle to dots with emphasis on scanning for obstacles 4 dots x 10 (CGA-minA) Reactive step backs emphasis on speed and large step length 2 x 10 (SBA-CGA) Forward, backward, and lateral stepping to dots for reactive stepping and gaze safety x 6 (CGA-minA) Hurdle taps 6" 2 x 10 (CGA)   [facility] PT Assessment - 06/06/22 0001       Standardized Balance Assessment   Standardized Balance Assessment Timed Up and Go Test      Berg Balance Test   Sit to Stand Able to stand without using hands and stabilize independently    Standing Unsupported Able to stand safely 2 minutes    Sitting with Back Unsupported but Feet Supported on Floor or Stool Able to sit safely and securely 2 minutes    Stand to Sit Sits safely with minimal use of hands    Transfers Able to transfer safely, definite need of hands    Standing Unsupported with Eyes Closed Able to stand 10 seconds with supervision    Standing Unsupported with Feet Together Able to place feet together independently and stand for 1  minute with supervision    From Standing, Reach Forward with Outstretched Arm Can reach forward >12 cm safely (5")    From Standing Position, Pick up Object from Floor Able to pick up shoe, needs supervision    From Standing Position, Turn to Look Behind Over each Shoulder Turn sideways only but maintains balance    Turn 360 Degrees Able to turn 360 degrees safely but slowly    Standing Unsupported, Alternately Place Feet on Step/Stool Able to complete 4 steps without aid or supervision    Standing Unsupported, One Foot in Front Needs help to step but can hold 15 seconds    Standing on One Leg Tries to lift leg/unable to hold 3 seconds but remains standing independently    Total Score 39      Timed Up and Go Test   Normal TUG (seconds) 19.3   CGA-SBA (without AD)          TUG: 19.3" with CGA and no AD    Self Care/Home Management: Reviewed use of walker for safety and need for it at all times given current balance presentation   PATIENT EDUCATION: Education details: Remphasized use of FWW and progress towards goal Person educated: Patient and Child(ren) Education method: Explanation Education comprehension: verbalized understanding  HOME EXERCISE PROGRAM: Access Code: [email or web address] URL: [email or web address] Date: 05/16/2022 Prepared by: Ethelene Browns  Exercises - Standing March with Counter Support  - 1 x daily - 7 x weekly - 1 sets - 10 reps - Standing Hip Flexion with Counter Support  - 1 x daily - 7 x weekly - 1 sets - 10 reps - Standing Hip Extension with Unilateral Counter Support  - 1 x daily -  7 x weekly - 1 sets - 10 reps - Standing Hip Abduction with Counter Support  - 1 x daily - 7 x weekly - 1 sets - 10 reps - Standing Romberg to 1/4 Tandem Stance  - 1 x daily - 7 x weekly - 1 sets - 1-2 reps - 15 secs hold - Romberg Stance  - 1 x daily - 7 x weekly - 1 sets - 1-2 reps - 15 sec hold hold - Single Leg Stance with Arms Out  - 1 x daily - 7 x weekly - 1 sets  - 1-2 reps - 10 sec hold - Sit to Stand Without Arm Support  - 1 x daily - 7 x weekly - 1 sets - 10 reps   GOALS: Goals reviewed with patient? Yes  SHORT TERM GOALS: Target date: 06-09-21  Improve Berg score to >/= 40/56 to demo improved balance and to decrease fall risk. Baseline: 35/56, 06/06/2022 improved to 39/56 Goal status: IN PROGRESS  2.  Pt will improve TUG score without use of RW from 15.16 secs to </= 14 secs for reduced fall risk. Baseline: 15.16 secs; degressed to 19.3" with CGA but performed after long overground walk which may increased fatigue Goal status: IN PROGRESS  3.  Amb. Household distances modified independently with use of RW. Baseline: 06/06/2022 recommend use or RW in home for safety Goal status: REVISED  4.  Amb. 300' without device with CGA on flat even, uneven surfaces for increased community accessibility. Baseline: 06/06/2022:  230 feet and tolerated for 2 minutes and 18" (CGA) Goal status: REVISED  5.  Independent in HEP for balance and strengthening exercises.  Baseline:  Goal status: INITIAL   LONG TERM GOALS: Target date: 07-07-21  Improve FOTO score by at least 10 points to demo improvement in mobility. Baseline:  Goal status: INITIAL  Improve Berg score to >/= 46/56 to demo improved balance and to decrease fall risk. Baseline: 35/56; 06/06/2022 improved to 39/56 Goal status: Progressing  3.   Pt will improve TUG score without use of RW from 15.16 secs to </= 12 secs for reduced fall risk. Baseline: 15.16 secs;  degressed to 19.3" with CGA but performed after long overground walk which may increased fatigue Goal status: Progressing  4.   Amb. 500' without device with CGA on flat even, uneven surfaces for increased community accessibility. Baseline: 230 feet and tolerated for 2 minutes and 18" (CGA) Goal status: Revised  5.  Pt will perform floor to stand transfer with UE support with SBA.  Baseline:  Goal status: INITIAL  6.  Independent in  updated HEP for balance and strengthening exs.  Baseline:  Goal status: INITIAL   ASSESSMENT:  CLINICAL IMPRESSION: PT session focused on review of short term goals with plan towards progressing to discharge. Patient demonstrated tolerance for ambulating 230' in session with CGA and no AD. Continued to strongly emphasize use of RW outside of sessions at all time given patient's balance observed in session and tendency to "counter serf" for increased stability walking into clinic. Patient verbalized understanding and reinforced to family. Ptient regressed in TUG indicating continued risk of falls likely due to ambulating overground to fatigue shortly before. Patient goals revised to better fit current presentation. Continue with POC.  OBJECTIVE IMPAIRMENTS: decreased activity tolerance, decreased balance, decreased knowledge of use of DME, decreased strength, and impaired vision/preception.   ACTIVITY LIMITATIONS: carrying, bending, standing, squatting, transfers, and locomotion level  PARTICIPATION LIMITATIONS: meal prep, cleaning, laundry,  driving, shopping, community activity, and yard work  PERSONAL FACTORS: Behavior pattern and 1-2 comorbidities: diplopia and mild impulsivity  are also affecting patient's functional outcome.   REHAB POTENTIAL: Good  CLINICAL DECISION MAKING: Evolving/moderate complexity  EVALUATION COMPLEXITY: Moderate  PLAN:  PT FREQUENCY: 2x/week  PT DURATION: 8 weeks  PLANNED INTERVENTIONS: Therapeutic exercises, Therapeutic activity, Neuromuscular re-education, Balance training, Gait training, Patient/Family education, Self Care, Stair training, and DME instructions  PLAN FOR NEXT SESSION:  Floor transfers; continue assessing progress towards goals, final HEP (family interested in 2 more visits currently)  Malachi Carl, PT, DPT 06/06/2022, 12:06 PM

## 2022-06-06 NOTE — Therapy (Signed)
OUTPATIENT SPEECH LANGUAGE PATHOLOGY TREATMENT NOTE   Patient Name: Adam Arellano MRN: [id number] DOB:Sep 19, 1931, 89 y.o., male Today's Date: 06/06/2022  PCP: Lajean Manes MD  REFERRING PROVIDER: Cathlyn Parsons, PA-C  END OF SESSION:  End of Session - 06/06/22 1018     Visit Number 4    Number of Visits 17    Date for SLP Re-Evaluation 07/04/22    Authorization Type U[employer] Medicare    SLP Start Time 1019    SLP Stop Time  1100    SLP Time Calculation (min) 41 min    Activity Tolerance Patient tolerated treatment well               Past Medical History:  Diagnosis Date   Asthma    a. Childhood.   Coronary artery disease    a. NSTEMI s/p DES to dRCA 2007. b. Angina 07/2009: s/p DESx2 to mid RCA for ISR and prox RCA, EF 65%. c. Stress test 05/2013: low risk, no evidence of ischemia, EF 61%.   Dyslipidemia    GERD (gastroesophageal reflux disease)    History of kidney stones    Hypertension    Peripheral vascular disease (HCC)    Prostate cancer ([place])    a. s/p radical prostatectomy.   PVC's (premature ventricular contractions)    S/P mitral valve clip implantation 08/19/2020   s/p MitraClip implantation with a XTW by Dr. Burt Knack    Transient global amnesia    a. Adm 2009: imaging nonacute, had resolved.   Past Surgical History:  Procedure Laterality Date   CARDIAC CATHETERIZATION     COLONOSCOPY     CORONARY STENT INTERVENTION N/A 08/06/2020   Procedure: CORONARY STENT INTERVENTION;  Surgeon: Sherren Mocha, MD;  Location: [place];  Service: Cardiovascular;  Laterality: N/A;   EYE SURGERY Bilateral    cataracts removed   MASTOIDECTOMY     L ear   MITRAL VALVE REPAIR     MITRAL VALVE REPAIR N/A 08/19/2020   Procedure: MITRAL VALVE REPAIR;  Surgeon: Sherren Mocha, MD;  Location: [place] [facility];  Service: Cardiovascular;  Laterality: N/A;   PROSTATECTOMY     RIGHT/LEFT HEART CATH AND CORONARY ANGIOGRAPHY N/A 08/06/2020   Procedure: RIGHT/LEFT  HEART CATH AND CORONARY ANGIOGRAPHY;  Surgeon: Sherren Mocha, MD;  Location: [facility];  Service: Cardiovascular;  Laterality: N/A;   TEE WITHOUT CARDIOVERSION N/A 08/06/2020   Procedure: TRANSESOPHAGEAL ECHOCARDIOGRAM (TEE);  Surgeon: Sanda Klein, MD;  Location: [facility] ENDOSCOPY;  Service: Cardiovascular;  Laterality: N/A;   TEE WITHOUT CARDIOVERSION  08/19/2020   TEE WITHOUT CARDIOVERSION N/A 08/19/2020   Procedure: TRANSESOPHAGEAL ECHOCARDIOGRAM (TEE);  Surgeon: Sherren Mocha, MD;  Location: [place] [facility];  Service: Cardiovascular;  Laterality: N/A;   Patient Active Problem List   Diagnosis Date Noted   Acute ischemic left posterior cerebral artery (PCA) stroke ([place]) 04/07/2022   Acute ischemic stroke ([place]) 04/02/2022   Hypokalemia 04/02/2022   Fall at home, initial encounter 04/02/2022   Mitral valve disorder 01/16/2022   Paresthesia 01/16/2022   Exudative age-related macular degeneration of left eye with active choroidal neovascularization ([place]) 11/14/2021   Advanced nonexudative age-related macular degeneration of right eye without subfoveal involvement 11/14/2021   Advanced nonexudative age-related macular degeneration of left eye without subfoveal involvement 11/14/2021   S/P mitral valve clip implantation 08/19/2020   Prostate cancer ([place])    PVC's (premature ventricular contractions)    Peripheral vascular disease ([place])    Coronary artery disease  Nonrheumatic mitral valve regurgitation    Anemia in chronic kidney disease 04/24/2020   Stage 3b chronic kidney disease ([place]) 04/24/2020   History of malignant neoplasm of prostate 04/24/2020   Hypertensive renal disease 04/24/2020   Otorrhea of left ear 01/12/2020   Tympanic membrane perforation, left 01/12/2020   Acute non-recurrent sinusitis 08/07/2018   Tympanic membrane perforation, marginal, right 08/07/2018   Cholesteatoma of attic of ear, left 01/23/2017   Conductive hearing loss of both ears 01/23/2017    Laryngopharyngeal reflux (LPR) 01/23/2017   Perennial allergic rhinitis 01/23/2017   Sensorineural hearing loss (SNHL), bilateral 08/17/2015   Bilateral chronic secretory otitis media 08/09/2015   Chronic swimmer's ear of both sides 08/09/2015   History of colonic polyps 08/09/2015   Dysfunction of both eustachian tubes 07/19/2015   Primary hypertension 02/27/2014   Hyperlipidemia 06/10/2013    ONSET DATE: 04-02-22  REFERRING DIAG: I63.9 (ICD-10-CM) - CVA (cerebral vascular accident   THERAPY DIAG:  Aphasia  Cognitive communication deficit  Rationale for Evaluation and Treatment: Rehabilitation  SUBJECTIVE:   SUBJECTIVE STATEMENT: "she had to make it bigger"   PAIN: Are you having pain? No  OBJECTIVE:   PATIENT REPORTED OUTCOME MEASURES (PROM): Not completed due to time constraints  TODAY'S TREATMENT:                                                                                                                                         06-06-22: Re-education provided on optimizing use of memory journal to aid in recall of significant daily events. Pt attends with son in law, who reports they have been incorporating into routine with success, though Adam Arellano reports ongoing frustration with being unable to recall details about day. SLP educates on memory deficits and need to compensate for those deficits to aid in confidence about events. Generate alternative external memory aid to A in recall of prospective memory tasks- small pocket notebook. Education on keeping notes simple and direct, evaluating legibility, incorporating use into routine with A from family members. Son in Sports coach verbalizes understanding. Given x5 potential prospective memory tasks, pt able to write 1-2 words to aid in recall with occasional min-A in 100% of trials.   05-31-22: Pt attends with daughter. They ID most prominet need at this time is being able to recall details about past day and upcoming day. SLP  proposed use of memory aid calendar/journal paired with daily reflections to A in recall of important or relevant details from the day. Pt in agreement. SLP provided calendar sheets (daily) with room for schedule, to-do list, and notes from the day. Demonstrates how each section can be used. Pt able to generate list of x3 items he would like to recall: who he visits with, appointments, and significant events. SLP educates on creating routine for use, with consideration of best times to employ. SLP recommends referencing in morning to orient to days events  and using during nightly reflection on day. Encourage pt to recall as much as possible with cues from daughter PRN. Pt and daughter verbalize understanding.   12-15-23[name] evidencing usual memory lapses throughout conversation with SLP, asking same questions repeatedly about topics already discussed. Evidencing anomia re: favorite places to eat, people's names, etc. Tells SLP "it just goes and I can't say it." SLP educates on anomia strategy of description. In response to questioning cues, pt able to describe x3 usually frequented places and x2 common objects. Provided daughter with education on cueing strategies to [name] in word recall when anomia occurs. Daughter able to demonstrate semantic cueing and phonemic cueing with mod-I. Generated strategies to facilitate improved independence in medication management. Strategies to employ include routine, environmental cues, and visual aids. Education on fading cues from daughter as able. Daughter verbalizes understanding.   05-09-22: Trialed education and instruction of novel information (take sip of water/swallow versus "nervous cough"). Pt able to recall new instructions immediately; however, pt unable to recall after short delay. Usual prompting and repetition were effective to aid recall of new information throughout evaluation. Educated patient and daughter on POC to identify and address functional goals  to assist with maximizing patient return to baseline. Both verbalized understanding and agreement with POC.    PATIENT EDUCATION: Education details: see above Person educated: Patient and Child(ren) Education method: Customer service manager Education comprehension: verbalized understanding, returned demonstration, and needs further education   GOALS: Goals reviewed with patient? Yes  SHORT TERM GOALS: Target date: 06/06/2022  Pt will utilize word retrieval strategies on structured speech tasks with 80% accuracy given occasional mod A  Baseline: Goal status: IN PROGRESS  2.  Pt will recall novel strategies to reduce "nervous cough" for 4/5 opportunities given occasional mod A over 2 sessions  Baseline:  Goal status: IN PROGRESS  3.  Pt will utilize memory supports to aid recall on structured functional tasks (med management, etc) given occasional mod A over 2 sessions  Baseline:  Goal status: IN PROGRESS   LONG TERM GOALS: Target date: 07/04/2022  Pt will utilize word retrieval strategies to effectively relay work related information for 2/2 opportunities given occasional min A  Baseline:  Goal status: IN PROGRESS  2.  Pt will recall personally relevant information (medications, etc) with use of memory supports given occasional min A over 2 sessions  Baseline:  Goal status: IN PROGRESS  3.  Pt will demonstrate adequate cognitive ability to accurately perform functional tasks related to job with use of cognitive compensations given occasional min A Baseline:  Goal status: IN PROGRESS  ASSESSMENT:  CLINICAL IMPRESSION: Patient is a 89 y.o. male who was seen today for CVA in October 2023. Daughter, Adam Arellano, provides past hx due to patient short term recall deficits. Pt was independent prior, including living alone and working part time. Baseline impulsivity endorsed by daughter, which was evidenced in fast, unsteady transfers today. Impaired short term recall and reduced  awareness exhibited with limited awareness of current deficits or functional impact of his memory deficits observed. He currently lives with his daughter who is managing his medications and finances. Pt desires to return to work in some capacity. Recall benefited from repetition and cues this session. Occasional word finding reported and exhibited (albeit improved since hospitalization), with pt identifying 6/15 items on BNT. Descriptions, first letter cues, and written cues were intermittently effective. Hearing loss and changes in vision may have been contributing to patient performance this session. Pt would benefit  from skilled ST intervention to maximize cognitive function and communication effectiveness to optimize patient return to baseline.   OBJECTIVE IMPAIRMENTS: include attention, memory, awareness, executive functioning, and aphasia. These impairments are limiting patient from managing medications, managing appointments, managing finances, household responsibilities, ADLs/IADLs, and effectively communicating at home and in community. Factors affecting potential to achieve goals and functional outcome are ability to learn/carryover information. Patient will benefit from skilled SLP services to address above impairments and improve overall function.  REHAB POTENTIAL: Good  PLAN:  SLP FREQUENCY: 2x/week  SLP DURATION: 8 weeks  PLANNED INTERVENTIONS: Language facilitation, Cueing hierachy, Cognitive reorganization, Internal/external aids, Functional tasks, Multimodal communication approach, SLP instruction and feedback, Compensatory strategies, and Patient/family education    Su Monks, CCC-SLP 06/06/2022, 10:19 AM

## 2022-06-06 NOTE — Therapy (Signed)
OUTPATIENT OCCUPATIONAL THERAPY NEURO TREATMENT  Patient Name: Adam Arellano MRN: [id number] DOB:1931-10-05, 89 y.o., male Today's Date: 06/06/2022  PCP: Merlyn Lot, MD REFERRING PROVIDER: Cathlyn Parsons, PA-C  END OF SESSION:  OT End of Session - 06/06/22 1015     Visit Number 3    Number of Visits 16    Date for OT Re-Evaluation 07/10/22    Authorization Type UHC MCR    OT Start Time 1105    OT Stop Time 1143    OT Time Calculation (min) 38 min    Activity Tolerance Patient tolerated treatment well    Behavior During Therapy WFL for tasks assessed/performed              Past Medical History:  Diagnosis Date   Asthma    a. Childhood.   Coronary artery disease    a. NSTEMI s/p DES to dRCA 2007. b. Angina 07/2009: s/p DESx2 to mid RCA for ISR and prox RCA, EF 65%. c. Stress test 05/2013: low risk, no evidence of ischemia, EF 61%.   Dyslipidemia    GERD (gastroesophageal reflux disease)    History of kidney stones    Hypertension    Peripheral vascular disease (HCC)    Prostate cancer ([place])    a. s/p radical prostatectomy.   PVC's (premature ventricular contractions)    S/P mitral valve clip implantation 08/19/2020   s/p MitraClip implantation with a XTW by Dr. Burt Knack    Transient global amnesia    a. Adm 2009: imaging nonacute, had resolved.   Past Surgical History:  Procedure Laterality Date   CARDIAC CATHETERIZATION     COLONOSCOPY     CORONARY STENT INTERVENTION N/A 08/06/2020   Procedure: CORONARY STENT INTERVENTION;  Surgeon: Sherren Mocha, MD;  Location: [place] [facility];  Service: Cardiovascular;  Laterality: N/A;   EYE SURGERY Bilateral    cataracts removed   MASTOIDECTOMY     L ear   MITRAL VALVE REPAIR     MITRAL VALVE REPAIR N/A 08/19/2020   Procedure: MITRAL VALVE REPAIR;  Surgeon: Sherren Mocha, MD;  Location: [facility] [facility];  Service: Cardiovascular;  Laterality: N/A;   PROSTATECTOMY     RIGHT/LEFT HEART CATH AND CORONARY  ANGIOGRAPHY N/A 08/06/2020   Procedure: RIGHT/LEFT HEART CATH AND CORONARY ANGIOGRAPHY;  Surgeon: Sherren Mocha, MD;  Location: [place] [facility];  Service: Cardiovascular;  Laterality: N/A;   TEE WITHOUT CARDIOVERSION N/A 08/06/2020   Procedure: TRANSESOPHAGEAL ECHOCARDIOGRAM (TEE);  Surgeon: Sanda Klein, MD;  Location: [facility] ENDOSCOPY;  Service: Cardiovascular;  Laterality: N/A;   TEE WITHOUT CARDIOVERSION  08/19/2020   TEE WITHOUT CARDIOVERSION N/A 08/19/2020   Procedure: TRANSESOPHAGEAL ECHOCARDIOGRAM (TEE);  Surgeon: Sherren Mocha, MD;  Location: [place] [facility];  Service: Cardiovascular;  Laterality: N/A;   Patient Active Problem List   Diagnosis Date Noted   Acute ischemic left posterior cerebral artery (PCA) stroke ([place]) 04/07/2022   Acute ischemic stroke ([place]) 04/02/2022   Hypokalemia 04/02/2022   Fall at home, initial encounter 04/02/2022   Mitral valve disorder 01/16/2022   Paresthesia 01/16/2022   Exudative age-related macular degeneration of left eye with active choroidal neovascularization ([place]) 11/14/2021   Advanced nonexudative age-related macular degeneration of right eye without subfoveal involvement 11/14/2021   Advanced nonexudative age-related macular degeneration of left eye without subfoveal involvement 11/14/2021   S/P mitral valve clip implantation 08/19/2020   Prostate cancer ([place])    PVC's (premature ventricular contractions)    Peripheral vascular disease (  [place])    Coronary artery disease    Nonrheumatic mitral valve regurgitation    Anemia in chronic kidney disease 04/24/2020   Stage 3b chronic kidney disease ([place]) 04/24/2020   History of malignant neoplasm of prostate 04/24/2020   Hypertensive renal disease 04/24/2020   Otorrhea of left ear 01/12/2020   Tympanic membrane perforation, left 01/12/2020   Acute non-recurrent sinusitis 08/07/2018   Tympanic membrane perforation, marginal, right 08/07/2018   Cholesteatoma of attic of ear, left 01/23/2017    Conductive hearing loss of both ears 01/23/2017   Laryngopharyngeal reflux (LPR) 01/23/2017   Perennial allergic rhinitis 01/23/2017   Sensorineural hearing loss (SNHL), bilateral 08/17/2015   Bilateral chronic secretory otitis media 08/09/2015   Chronic swimmer's ear of both sides 08/09/2015   History of colonic polyps 08/09/2015   Dysfunction of both eustachian tubes 07/19/2015   Primary hypertension 02/27/2014   Hyperlipidemia 06/10/2013    ONSET DATE: 04/20/2022 (referral date)   REFERRING DIAG: I63.9 (ICD-10-CM) - CVA (cerebral vascular accident) ([place])  THERAPY DIAG:  Muscle weakness (generalized)  Other lack of coordination  Hemiplegia and hemiparesis following cerebral infarction affecting right dominant side (HCC)  Visuospatial deficit  Other symptoms and signs involving cognitive functions following cerebral infarction  Rationale for Evaluation and Treatment: Rehabilitation  SUBJECTIVE:   SUBJECTIVE STATEMENT: Pt goes by [facility]. He has not been seen by an eye doctor in years. Reports his vision has really declined in the last 6 months including difficulty knowing where the end of a reading line is. He likes to read the newspaper at home.  Pt accompanied by:  son  PERTINENT HISTORY:  [facility]22 with right-sided weakness, mild dysarthria and right hemianopsia on evaluation. Imaging was significant for left PICA infarct.  PMH: Primary hypertension   Stage 3b chronic kidney disease ([place]) Functional deficits secondary to acute ischemic left posterior cerebral artery stroke Multiple skin tears Hyperlipidemia Coronary artery disease Prostate cancer Mitral regurgitation GERD Diplopia Macular degeneration  PRECAUTIONS: Other: no driving, heart monitor  WEIGHT BEARING RESTRICTIONS: No  PAIN:  Are you having pain? No  FALLS: Has patient fallen in last 6 months? Yes. Number of falls 1 w/ stroke  LIVING ENVIRONMENT: Lives with:  lives with their family and lives alone, currently staying with daughter Pt lives in 2 story home but stays on main level, level entry. (Daughter has 6 STE her house)  Has following equipment at home: Environmental consultant - 2 wheeled, shower chair, Shower bench, and Grab bars  PLOF: Independent  PATIENT GOALS: shower I'ly  OBJECTIVE:   HAND DOMINANCE: Right  ADLs: Eating: using bigger handles, sometimes assist cutting meat Grooming: mod I w/ electric razor UB Dressing: independent  LB Dressing: supervision with donning underwear on shower chair Toileting: independent Bathing: mod I seated Tub Shower transfers: supervision/cues Equipment: Radio broadcast assistant and Grab bars  *Pt's daughter reports that he will most likely not return to living alone  IADLs: Shopping: dependent Light housekeeping: dependent (pt had housekeeper every 2 weeks for heavier cleaning)  Meal Prep: dependent  Community mobility: relies on family for transportation Medication management: family fills pillbox and reminds patient at times Financial management: daughter POA and now taking over Handwriting: 25% legible (premorbidly was not great but worse since stroke)   MOBILITY STATUS:  sometimes uses walker   UPPER EXTREMITY ROM:  Rt shoulder limited somewhat premorbidly d/t rotator cuff tear (not surgical candidate). BUE's elbows distally WFL's  HAND FUNCTION: Grip strength:  Right: 60.6 lbs; Left: 56.4 lbs  COORDINATION: 9 Hole Peg test: Right: 49.09  sec; Left: 36.75 sec (Rt shoulder slightly limited coordination as well)   SENSATION: Diminshed/dull RT hand  EDEMA: none   COGNITION: Overall cognitive status: Impaired and short term memory  VISION: Subjective report: Rt eye was affected from stroke (per daughter report) Baseline vision: Bifocals and Wears glasses all the time Visual history: macular degeneration (wet)   VISION ASSESSMENT: Visual Fields: Right homonymous hemianopsia and Right visual  field deficits     TODAY'S TREATMENT:            - Therapeutic activities completed for duration as noted below including:                                                                                                                     Letter cancellation (56M print size) - pt with difficulty locating desired letters especially at end of lines even with top and bottom lined guides. Therapist placed far right anchor to help pt attend all the way to the end of the lines. Pt with max difficulty and omissions (approx only 60% accuracy). Cues to stay on task for independent use of lined guide. OT educated pt and son on Reading Focus Cards for home use.   PATIENT EDUCATION: Education details: Lined Photographer Person educated: Patient Education method: Explanation, Demonstration, Verbal cues, and Handouts Education comprehension: verbalized understanding, returned demonstration, verbal cues required, and needs further education   HOME EXERCISE PROGRAM: 05/22/22: Coordination HEP, visual scanning strategies   GOALS: Goals reviewed with patient? Yes  SHORT TERM GOALS: Target date: 06/09/22  Independent with HEP for Rt hand coordination Baseline: Goal status: IN PROGRESS  2.  Pt will verbalize understanding with visual scanning strategies Baseline:  Goal status: IN PROGRESS  3.  Pt will verbalize understanding with memory strategies for ADLS Baseline:  Goal status: INITIAL  4.  Pt will consistently cut food Baseline:  Goal status: INITIAL   LONG TERM GOALS: Target date: 07/10/22  Independent with updated HEP prn, and review HEP for Rt shoulder (provided by ortho clinic)  Baseline:  Goal status: INITIAL  2.  Pt to increase handwriting to 75% legibility in print for name and address Baseline:  Goal status: INITIAL  3.  Pt to perform environmental scanning at 75% or greater legibility Baseline:  Goal status: INITIAL  4.  Pt to perform light IADLS (folding  clothes, washing dishes) and simple sandwich/microwaveable items consistently and safely at mod I level Baseline:  Goal status: INITIAL   ASSESSMENT:  CLINICAL IMPRESSION: Pt progressing towards STG's #1 and #2. Pt limited by vision and cognitive changes but remains good candidate for skilled OT services as needed to address these impairments for improved independence and safety with completion of functional activities.   PERFORMANCE DEFICITS: in functional skills including ADLs, IADLs, coordination, dexterity, ROM, strength, Fine motor control, Gross motor control, balance, decreased knowledge of use of DME, vision, and UE functional use,  cognitive skills including attention, memory, problem solving, and safety awareness.   IMPAIRMENTS: are limiting patient from ADLs, IADLs, work, and leisure.   CO-MORBIDITIES: has co-morbidities such as macular degeneration  that affects occupational performance. Patient will benefit from skilled OT to address above impairments and improve overall function.  REHAB POTENTIAL: Good   PLAN:  OT FREQUENCY: 2x/week  OT DURATION: 8 weeks (anticipate only 6 weeks needed)   PLANNED INTERVENTIONS: self care/ADL training, therapeutic exercise, therapeutic activity, neuromuscular re-education, passive range of motion, functional mobility training, fluidotherapy, moist heat, patient/family education, cognitive remediation/compensation, visual/perceptual remediation/compensation, coping strategies training, and DME and/or AE instructions  RECOMMENDED OTHER SERVICES: none at this time  CONSULTED AND AGREED WITH PLAN OF CARE: Patient and family member/caregiver  PLAN FOR NEXT SESSION: copy peg design or PVC pipe design; newspaper with magnifier   Dennis Bast, OT 06/06/2022, 1:49 PM

## 2022-06-08 ENCOUNTER — Encounter: Payer: Self-pay | Admitting: Physical Therapy

## 2022-06-08 ENCOUNTER — Ambulatory Visit: Payer: Medicare Other | Admitting: Physical Therapy

## 2022-06-08 ENCOUNTER — Ambulatory Visit: Payer: Medicare Other | Admitting: Occupational Therapy

## 2022-06-08 ENCOUNTER — Ambulatory Visit: Payer: Medicare Other | Admitting: Speech Pathology

## 2022-06-08 VITALS — BP 169/82 | HR 64

## 2022-06-08 DIAGNOSIS — I69351 Hemiplegia and hemiparesis following cerebral infarction affecting right dominant side: Secondary | ICD-10-CM

## 2022-06-08 DIAGNOSIS — R2689 Other abnormalities of gait and mobility: Secondary | ICD-10-CM

## 2022-06-08 DIAGNOSIS — M6281 Muscle weakness (generalized): Secondary | ICD-10-CM

## 2022-06-08 DIAGNOSIS — R2681 Unsteadiness on feet: Secondary | ICD-10-CM

## 2022-06-08 NOTE — Therapy (Signed)
OUTPATIENT PHYSICAL THERAPY NEURO TREATMENT   Patient Name: Adam Arellano MRN: [id number] DOB:[birth year], 89 y.o., male Today's Date: 06/08/2022  PCP: Lajean Manes, MD REFERRING PROVIDER: Cathlyn Parsons, PA-C  END OF SESSION:  PT End of Session - 06/08/22 0839     Visit Number 7    Number of Visits 17    Date for PT Re-Evaluation 07/07/22    Authorization Type [employer]    Authorization Time Period 05-09-22 - 07-07-22    PT Start Time 0839    PT Stop Time 0920    PT Time Calculation (min) 41 min    Equipment Utilized During Treatment Gait belt    Activity Tolerance Patient tolerated treatment well    Behavior During Therapy WFL for tasks assessed/performed               Past Medical History:  Diagnosis Date   Asthma    a. Childhood.   Coronary artery disease    a. NSTEMI s/p DES to dRCA 2007. b. Angina 07/2009: s/p DESx2 to mid RCA for ISR and prox RCA, EF 65%. c. Stress test 05/2013: low risk, no evidence of ischemia, EF 61%.   Dyslipidemia    GERD (gastroesophageal reflux disease)    History of kidney stones    Hypertension    Peripheral vascular disease (HCC)    Prostate cancer ([place])    a. s/p radical prostatectomy.   PVC's (premature ventricular contractions)    S/P mitral valve clip implantation 08/19/2020   s/p MitraClip implantation with a XTW by Dr. Burt Knack    Transient global amnesia    a. Adm 2009: imaging nonacute, had resolved.   Past Surgical History:  Procedure Laterality Date   CARDIAC CATHETERIZATION     COLONOSCOPY     CORONARY STENT INTERVENTION N/A 08/06/2020   Procedure: CORONARY STENT INTERVENTION;  Surgeon: Sherren Mocha, MD;  Location: [facility];  Service: Cardiovascular;  Laterality: N/A;   EYE SURGERY Bilateral    cataracts removed   MASTOIDECTOMY     L ear   MITRAL VALVE REPAIR     MITRAL VALVE REPAIR N/A 08/19/2020   Procedure: MITRAL VALVE REPAIR;  Surgeon: Sherren Mocha, MD;  Location: [facility];   Service: Cardiovascular;  Laterality: N/A;   PROSTATECTOMY     RIGHT/LEFT HEART CATH AND CORONARY ANGIOGRAPHY N/A 08/06/2020   Procedure: RIGHT/LEFT HEART CATH AND CORONARY ANGIOGRAPHY;  Surgeon: Sherren Mocha, MD;  Location: [facility];  Service: Cardiovascular;  Laterality: N/A;   TEE WITHOUT CARDIOVERSION N/A 08/06/2020   Procedure: TRANSESOPHAGEAL ECHOCARDIOGRAM (TEE);  Surgeon: Sanda Klein, MD;  Location: [facility];  Service: Cardiovascular;  Laterality: N/A;   TEE WITHOUT CARDIOVERSION  08/19/2020   TEE WITHOUT CARDIOVERSION N/A 08/19/2020   Procedure: TRANSESOPHAGEAL ECHOCARDIOGRAM (TEE);  Surgeon: Sherren Mocha, MD;  Location: [facility] [facility];  Service: Cardiovascular;  Laterality: N/A;   Patient Active Problem List   Diagnosis Date Noted   Acute ischemic left posterior cerebral artery (PCA) stroke ([place]) 04/07/2022   Acute ischemic stroke ([place]) 04/02/2022   Hypokalemia 04/02/2022   Fall at home, initial encounter 04/02/2022   Mitral valve disorder 01/16/2022   Paresthesia 01/16/2022   Exudative age-related macular degeneration of left eye with active choroidal neovascularization ([place]) 11/14/2021   Advanced nonexudative age-related macular degeneration of right eye without subfoveal involvement 11/14/2021   Advanced nonexudative age-related macular degeneration of left eye without subfoveal involvement 11/14/2021   S/P mitral valve clip  implantation 08/19/2020   Prostate cancer ([place])    PVC's (premature ventricular contractions)    Peripheral vascular disease (HCC)    Coronary artery disease    Nonrheumatic mitral valve regurgitation    Anemia in chronic kidney disease 04/24/2020   Stage 3b chronic kidney disease ([place]) 04/24/2020   History of malignant neoplasm of prostate 04/24/2020   Hypertensive renal disease 04/24/2020   Otorrhea of left ear 01/12/2020   Tympanic membrane perforation, left 01/12/2020   Acute non-recurrent sinusitis 08/07/2018   Tympanic  membrane perforation, marginal, right 08/07/2018   Cholesteatoma of attic of ear, left 01/23/2017   Conductive hearing loss of both ears 01/23/2017   Laryngopharyngeal reflux (LPR) 01/23/2017   Perennial allergic rhinitis 01/23/2017   Sensorineural hearing loss (SNHL), bilateral 08/17/2015   Bilateral chronic secretory otitis media 08/09/2015   Chronic swimmer's ear of both sides 08/09/2015   History of colonic polyps 08/09/2015   Dysfunction of both eustachian tubes 07/19/2015   Primary hypertension 02/27/2014   Hyperlipidemia 06/10/2013    ONSET DATE: 04-02-22  REFERRING DIAG:  Diagnosis  I63.9 (ICD-10-CM) - CVA (cerebral vascular accident) ([place])    THERAPY DIAG:  Muscle weakness (generalized)  Hemiplegia and hemiparesis following cerebral infarction affecting right dominant side (HCC)  Unsteadiness on feet  Other abnormalities of gait and mobility  Rationale for Evaluation and Treatment: Rehabilitation  SUBJECTIVE:                                                                                                                                                                                             SUBJECTIVE STATEMENT: Patient prefers to go by [name].  Patient reports that he is doing well. Continues to ambulate into clinic without cane, stating "I will get weak if I use it." Therapist educates on importance of use for safety to prevent injury as well as addresses ways patient can continue to stay strong and patient verbalizes understanding.   Pt accompanied by: son-in-law, [name]  PERTINENT HISTORY: Brief HPI:   Adam Arellano is a 89 y.o. male  who was found down at his home my his neighbor on 04/02/2022. He presented to the [facility] ED with right-sided weakness, mild dysarthria and right hemianopsia on evaluation. Imaging was significant for left PICA infarct. No apparent injuries. Neurology consulted and resumed his Plavix and started aspirin 81 mg for 3 months  then aspirin alone. Outside of window for tenecteplase and no evidence of LVO. Recommended resuming home statin; however, per PCP notes, the patient has stopped his home Crestor due to "aches".   Discharge Diagnoses:  Principal Problem:   Acute  ischemic left posterior cerebral artery (PCA) stroke (HCC) Active Problems:   Primary hypertension   Stage 3b chronic kidney disease (HCC) Functional deficits secondary to acute ischemic left posterior cerebral artery stroke Multiple skin tears Hyperlipidemia Coronary artery disease Prostate cancer Mitral regurgitation GERD Diplopia consipation     PAIN:  Are you having pain? No - pt reports no pain or soreness at current time  PRECAUTIONS: Fall  PATIENT GOALS: "Get back to being myself again"  OBJECTIVE:   Today's Vitals   06/08/22 0844  BP: (!) 169/82  Pulse: 64      NMR:  Reviewed/performed final HEP with education on safety techniques.(SBA-CGA with mod verbal/visual cues) - Standing March with Counter Support  2 x 10 reps - Standing Hip Flexion with Counter Support 2 x 10 reps - Standing Hip Extension with Unilateral Counter Support  2 x 10 reps - Standing Hip Abduction with Counter Support  2 x 10 reps - Standing Romberg to 1/4 Tandem Stance 2 x 20" - required minA to maintain so regressed to performing with feet together only (CGA) - Single Leg Stance with Arms Out + UE support 2 x 10" holds - Sit to Stand Without Arm Support  2 x 10  There Act:  Tall kneel -> half kneel on low mat with UE support on bench in preparation for floor transfer (CGA - minA for LE management)  Mat to floor transfer - over right side with (CGA and mod verbal cues) required UE support on low mat to come to stand  Stand<>pivot transfers without AD + (SBA-CGA) with emphasis on proper lower extremity alignment to improve safety of transfer    PATIENT EDUCATION: Education details: Remphasized use of FWW and safe transfer technique Person  educated: Patient and Child(ren) Education method: Explanation Education comprehension: verbalized understanding  HOME EXERCISE PROGRAM: Access Code: [email or web address] URL: [email or web address] Date: 05/16/2022 Prepared by: Ethelene Browns  Exercises - Standing March with Counter Support  - 1 x daily - 7 x weekly - 1 sets - 10 reps - Standing Hip Flexion with Counter Support  - 1 x daily - 7 x weekly - 1 sets - 10 reps - Standing Hip Extension with Unilateral Counter Support  - 1 x daily - 7 x weekly - 1 sets - 10 reps - Standing Hip Abduction with Counter Support  - 1 x daily - 7 x weekly - 1 sets - 10 reps - Romberg Stance  - 1 x daily - 7 x weekly - 1 sets - 1-2 reps - 15 sec hold hold - Single Leg Stance with Arms Out  - 1 x daily - 7 x weekly - 1 sets - 1-2 reps - 10 sec hold - Sit to Stand Without Arm Support  - 1 x daily - 7 x weekly - 1 sets - 10 reps   GOALS: Goals reviewed with patient? Yes  SHORT TERM GOALS: Target date: 06-09-21  Improve Berg score to >/= 40/56 to demo improved balance and to decrease fall risk. Baseline: 35/56, 06/06/2022 improved to 39/56 Goal status: IN PROGRESS  2.  Pt will improve TUG score without use of RW from 15.16 secs to </= 14 secs for reduced fall risk. Baseline: 15.16 secs; degressed to 19.3" with CGA but performed after long overground walk which may increased fatigue Goal status: IN PROGRESS  3.  Amb. Household distances modified independently with use of RW. Baseline: 06/06/2022 recommend use or RW in home for safety Goal status: REVISED  4.  Amb. 300' without device with CGA on flat even, uneven surfaces for increased community accessibility. Baseline: 06/06/2022:  230 feet and tolerated for 2 minutes and 18" (CGA) Goal status: REVISED  5.  Independent in HEP for balance and strengthening exercises.  Baseline:  Goal status: INITIAL   LONG TERM GOALS: Target date: 07-07-21  Improve FOTO score by at least 10 points to demo  improvement in mobility. Baseline:  Goal status: INITIAL  Improve Berg score to >/= 46/56 to demo improved balance and to decrease fall risk. Baseline: 35/56; 06/06/2022 improved to 39/56 Goal status: Progressing  3.   Pt will improve TUG score without use of RW from 15.16 secs to </= 12 secs for reduced fall risk. Baseline: 15.16 secs;  degressed to 19.3" with CGA but performed after long overground walk which may increased fatigue Goal status: Progressing  4.   Amb. 500' without device with CGA on flat even, uneven surfaces for increased community accessibility. Baseline: 230 feet and tolerated for 2 minutes and 18" (CGA) Goal status: Revised  5.  Pt will perform floor to stand transfer with UE support with SBA.  Baseline: Patient able to complete safely with CGA with use of UE support; cannot perform without UE support (06/08/2022) Goal status: NOT MET  6.  Independent in updated HEP for balance and strengthening exs.  Baseline:  Goal status: INITIAL   ASSESSMENT:  CLINICAL IMPRESSION: PT session focused on review of final HEP in preparation for discharge and safety with transfers. Patient HEP modified to standing balance feet together only instead of semitandem for safety at home at this time given presentation in today's session. Educated patient and family on continued importance of use of walker and supervision when patient is completing HEP. Patient continues to demonstrate poor alignment when going to sit for breaks throughout session so reinforced to patient and caregiver importance of LE alignment with chair when patient goes to sit. Plan to dishcarge next session with final review of home program and goals.   OBJECTIVE IMPAIRMENTS: decreased activity tolerance, decreased balance, decreased knowledge of use of DME, decreased strength, and impaired vision/preception.   ACTIVITY LIMITATIONS: carrying, bending, standing, squatting, transfers, and locomotion level  PARTICIPATION  LIMITATIONS: meal prep, cleaning, laundry, driving, shopping, community activity, and yard work  PERSONAL FACTORS: Behavior pattern and 1-2 comorbidities: diplopia and mild impulsivity  are also affecting patient's functional outcome.   REHAB POTENTIAL: Good  CLINICAL DECISION MAKING: Evolving/moderate complexity  EVALUATION COMPLEXITY: Moderate  PLAN:  PT FREQUENCY: 2x/week  PT DURATION: 8 weeks  PLANNED INTERVENTIONS: Therapeutic exercises, Therapeutic activity, Neuromuscular re-education, Balance training, Gait training, Patient/Family education, Self Care, Stair training, and DME instructions  PLAN FOR NEXT SESSION:  Discharge, safety education  Malachi Carl, PT, DPT 06/08/2022, 11:21 AM

## 2022-06-13 ENCOUNTER — Ambulatory Visit: Payer: Medicare Other | Admitting: Physical Therapy

## 2022-06-13 ENCOUNTER — Encounter: Payer: Medicare Other | Admitting: Occupational Therapy

## 2022-06-13 ENCOUNTER — Encounter: Payer: Self-pay | Admitting: Physical Therapy

## 2022-06-13 DIAGNOSIS — R2689 Other abnormalities of gait and mobility: Secondary | ICD-10-CM

## 2022-06-13 DIAGNOSIS — M6281 Muscle weakness (generalized): Secondary | ICD-10-CM

## 2022-06-13 DIAGNOSIS — R2681 Unsteadiness on feet: Secondary | ICD-10-CM

## 2022-06-13 DIAGNOSIS — R278 Other lack of coordination: Secondary | ICD-10-CM

## 2022-06-13 NOTE — Therapy (Addendum)
OUTPATIENT PHYSICAL THERAPY NEURO TREATMENT / DISCHARGE   Patient Name: Adam Arellano MRN: [id number] DOB:11-29-1931, 89 y.o., male Today's Date: 06/13/2022  PCP: Lajean Manes, MD REFERRING PROVIDER: Cathlyn Parsons, PA-C  PHYSICAL THERAPY DISCHARGE SUMMARY  Visits from Start of Care: 8  Current functional level related to goals / functional outcomes: Patient demonstrates good progress towards balance goal and compliance with HEP. Patient demonstrates plateau in remaining goals or not met given memory impairment. See below for details.   Remaining deficits: Ambulation for long distance, requires supervision for reinforcement of walker safety and transfer safety. Family educated and aware of how to provide as not sticking due to memory deficits.    Education / Equipment: Systems analyst, when to return to physical therapy if patient demonstrates a functional decline.    Patient agrees to discharge. Patient goals were partially met. Patient is being discharged due to maximized rehab potential. Given patient's cognition, maximal rehab potential has been met at this time and family demonstrates ability to safely continue to manage at home.   END OF SESSION:  PT End of Session - 06/13/22 0933     Visit Number 8    Number of Visits 17    Date for PT Re-Evaluation 07/07/22    Authorization Type [employer] Medicare    Authorization Time Period 05-09-22 - 07-07-22    PT Start Time 0931    PT Stop Time 1005   treatment time shortened due to patient goals fully assessed and HEP updated/reviewed   PT Time Calculation (min) 34 min    Equipment Utilized During Treatment Gait belt    Activity Tolerance Patient tolerated treatment well    Behavior During Therapy WFL for tasks assessed/performed               Past Medical History:  Diagnosis Date   Asthma    a. Childhood.   Coronary artery disease    a. NSTEMI s/p DES to dRCA 2007. b. Angina 07/2009: s/p DESx2 to mid RCA for ISR and  prox RCA, EF 65%. c. Stress test 05/2013: low risk, no evidence of ischemia, EF 61%.   Dyslipidemia    GERD (gastroesophageal reflux disease)    History of kidney stones    Hypertension    Peripheral vascular disease (HCC)    Prostate cancer ([place])    a. s/p radical prostatectomy.   PVC's (premature ventricular contractions)    S/P mitral valve clip implantation 08/19/2020   s/p MitraClip implantation with a XTW by Dr. Burt Knack    Transient global amnesia    a. Adm 2009: imaging nonacute, had resolved.   Past Surgical History:  Procedure Laterality Date   CARDIAC CATHETERIZATION     COLONOSCOPY     CORONARY STENT INTERVENTION N/A 08/06/2020   Procedure: CORONARY STENT INTERVENTION;  Surgeon: Sherren Mocha, MD;  Location: [place] [facility];  Service: Cardiovascular;  Laterality: N/A;   EYE SURGERY Bilateral    cataracts removed   MASTOIDECTOMY     L ear   MITRAL VALVE REPAIR     MITRAL VALVE REPAIR N/A 08/19/2020   Procedure: MITRAL VALVE REPAIR;  Surgeon: Sherren Mocha, MD;  Location: [place] [facility];  Service: Cardiovascular;  Laterality: N/A;   PROSTATECTOMY     RIGHT/LEFT HEART CATH AND CORONARY ANGIOGRAPHY N/A 08/06/2020   Procedure: RIGHT/LEFT HEART CATH AND CORONARY ANGIOGRAPHY;  Surgeon: Sherren Mocha, MD;  Location: [place] [facility];  Service: Cardiovascular;  Laterality: N/A;   TEE WITHOUT  CARDIOVERSION N/A 08/06/2020   Procedure: TRANSESOPHAGEAL ECHOCARDIOGRAM (TEE);  Surgeon: Sanda Klein, MD;  Location: [facility];  Service: Cardiovascular;  Laterality: N/A;   TEE WITHOUT CARDIOVERSION  08/19/2020   TEE WITHOUT CARDIOVERSION N/A 08/19/2020   Procedure: TRANSESOPHAGEAL ECHOCARDIOGRAM (TEE);  Surgeon: Sherren Mocha, MD;  Location: [place] [facility];  Service: Cardiovascular;  Laterality: N/A;   Patient Active Problem List   Diagnosis Date Noted   Acute ischemic left posterior cerebral artery (PCA) stroke ([place]) 04/07/2022   Acute ischemic stroke ([place])  04/02/2022   Hypokalemia 04/02/2022   Fall at home, initial encounter 04/02/2022   Mitral valve disorder 01/16/2022   Paresthesia 01/16/2022   Exudative age-related macular degeneration of left eye with active choroidal neovascularization ([place]) 11/14/2021   Advanced nonexudative age-related macular degeneration of right eye without subfoveal involvement 11/14/2021   Advanced nonexudative age-related macular degeneration of left eye without subfoveal involvement 11/14/2021   S/P mitral valve clip implantation 08/19/2020   Prostate cancer ([place])    PVC's (premature ventricular contractions)    Peripheral vascular disease ([place])    Coronary artery disease    Nonrheumatic mitral valve regurgitation    Anemia in chronic kidney disease 04/24/2020   Stage 3b chronic kidney disease ([place]) 04/24/2020   History of malignant neoplasm of prostate 04/24/2020   Hypertensive renal disease 04/24/2020   Otorrhea of left ear 01/12/2020   Tympanic membrane perforation, left 01/12/2020   Acute non-recurrent sinusitis 08/07/2018   Tympanic membrane perforation, marginal, right 08/07/2018   Cholesteatoma of attic of ear, left 01/23/2017   Conductive hearing loss of both ears 01/23/2017   Laryngopharyngeal reflux (LPR) 01/23/2017   Perennial allergic rhinitis 01/23/2017   Sensorineural hearing loss (SNHL), bilateral 08/17/2015   Bilateral chronic secretory otitis media 08/09/2015   Chronic swimmer's ear of both sides 08/09/2015   History of colonic polyps 08/09/2015   Dysfunction of both eustachian tubes 07/19/2015   Primary hypertension 02/27/2014   Hyperlipidemia 06/10/2013    ONSET DATE: 04-02-22  REFERRING DIAG:  Diagnosis  I63.9 (ICD-10-CM) - CVA (cerebral vascular accident) ([place])    THERAPY DIAG:  Muscle weakness (generalized)  Unsteadiness on feet  Other abnormalities of gait and mobility  Other lack of coordination  Rationale for Evaluation and Treatment:  Rehabilitation  SUBJECTIVE:                                                                                                                                                                                             SUBJECTIVE STATEMENT: Patient prefers to go by [facility].  Patient reports that he is doing well. Patient arrives to clinic  with walker today and requires min reinforcement for proper alignment of cane. Caregivers verbalize confidence in ability to continue to progress at home. Patient reports compliance with HEP, no changes to medication, and no falls since last visit.  Pt accompanied by: son-in-law, [name]  PERTINENT HISTORY: Brief HPI:   Adam Arellano is a 89 y.o. male  who was found down at his home my his neighbor on 04/02/2022. He [facility] ED with right-sided weakness, mild dysarthria and right hemianopsia on evaluation. Imaging was significant for left PICA infarct. No apparent injuries. Neurology consulted and resumed his Plavix and started aspirin 81 mg for 3 months then aspirin alone. Outside of window for tenecteplase and no evidence of LVO. Recommended resuming home statin; however, per PCP notes, the patient has stopped his home Crestor due to "aches".   Discharge Diagnoses:  Principal Problem:   Acute ischemic left posterior cerebral artery (PCA) stroke (HCC) Active Problems:   Primary hypertension   Stage 3b chronic kidney disease (HCC) Functional deficits secondary to acute ischemic left posterior cerebral artery stroke Multiple skin tears Hyperlipidemia Coronary artery disease Prostate cancer Mitral regurgitation GERD Diplopia consipation     PAIN:  Are you having pain? No - pt reports no pain or soreness at current time  PRECAUTIONS: Fall  PATIENT GOALS: "Get back to being myself again"  OBJECTIVE:   There were no vitals filed for this visit.  Gait: 410 feet with CGA + FWW and min verbal cues intermittently to keep walker close  to body to improve stability  NMR:   [facility] PT Assessment - 06/13/22 0001       Berg Balance Test   Sit to Stand Able to stand without using hands and stabilize independently    Standing Unsupported Able to stand safely 2 minutes    Sitting with Back Unsupported but Feet Supported on Floor or Stool Able to sit safely and securely 2 minutes    Stand to Sit Sits safely with minimal use of hands    Transfers Able to transfer safely, definite need of hands    Standing Unsupported with Eyes Closed Able to stand 10 seconds with supervision    Standing Unsupported with Feet Together Able to place feet together independently and stand 1 minute safely    From Standing, Reach Forward with Outstretched Arm Can reach forward >12 cm safely (5")    From Standing Position, Pick up Object from Floor Able to pick up shoe, needs supervision    From Standing Position, Turn to Look Behind Over each Shoulder Looks behind one side only/other side shows less weight shift    Turn 360 Degrees Able to turn 360 degrees safely but slowly    Standing Unsupported, Alternately Place Feet on Step/Stool Able to complete 4 steps without aid or supervision    Standing Unsupported, One Foot in Front Able to take small step independently and hold 30 seconds    Standing on One Leg Tries to lift leg/unable to hold 3 seconds but remains standing independently    Total Score 42      Timed Up and Go Test   Normal TUG (seconds) 25.6   with FWW; 22.23" without use of walker            PATIENT EDUCATION: Education details: Remphasized use of FWW and safe transfer technique and return to physical therapy with referral if decline in physical funcitons Person educated: Patient and Caregiver son-in-law Education method: Explanation Education comprehension:  verbalized understanding  HOME EXERCISE PROGRAM: Access Code: [email or web address] URL: [email or web address] Date: 05/16/2022 Prepared by: Ethelene Browns  Exercises -  Standing March with Counter Support  - 1 x daily - 7 x weekly - 1 sets - 10 reps - Standing Hip Flexion with Counter Support  - 1 x daily - 7 x weekly - 1 sets - 10 reps - Standing Hip Extension with Unilateral Counter Support  - 1 x daily - 7 x weekly - 1 sets - 10 reps - Standing Hip Abduction with Counter Support  - 1 x daily - 7 x weekly - 1 sets - 10 reps - Romberg Stance  - 1 x daily - 7 x weekly - 1 sets - 1-2 reps - 15 sec hold hold - Single Leg Stance with Arms Out  - 1 x daily - 7 x weekly - 1 sets - 1-2 reps - 10 sec hold - Sit to Stand Without Arm Support  - 1 x daily - 7 x weekly - 1 sets - 10 reps   GOALS: Goals reviewed with patient? Yes  SHORT TERM GOALS: Target date: 06-09-21  Improve Berg score to >/= 40/56 to demo improved balance and to decrease fall risk. Baseline: 35/56, 06/06/2022 improved to 39/56; improved to 42/56 on 06/13/2022 Goal status: MET  2.  Pt will improve TUG score without use of RW from 15.16 secs to </= 14 secs for reduced fall risk. Baseline: 15.16 secs; degressed to 19.3" with CGA but performed after long overground walk which may increased fatigue; 25.6" with FWW and 22.23" without AD (CGA) Goal status: NOT MET  3.  Amb. Household distances modified independently with use of RW. Baseline: 06/06/2022 recommend use or RW in home for safety; 410 feet with CGA + FWW and min verbal cues (06/13/2022) Goal status: MET  4.  Amb. 300' without device with CGA on flat even, uneven surfaces for increased community accessibility. Baseline: 06/06/2022:  230 feet and tolerated for 2 minutes and 18" (CGA); 410 feet with CGA + FWW and min verbal cues; 410 feet with CGA + FWW and min verbal cues Goal status: NOT MET  5.  Independent in HEP for balance and strengthening exercises.  Baseline: Provided, Met on 06/13/2022 with caregiver assist Goal status: MET   LONG TERM GOALS: Target date: 07-07-21  Improve FOTO score by at least 10 points to demo improvement in  mobility. Baseline: FOTO not completed at eval so not relevant for patient Goal status: DISCONTINUED  Improve Berg score to >/= 46/56 to demo improved balance and to decrease fall risk. Baseline: 35/56; 06/06/2022 improved to 39/56; improved to 42/56 on 06/13/2022 Goal status: NOT MET  3.   Pt will improve TUG score without use of RW from 15.16 secs to </= 12 secs for reduced fall risk. Baseline: 15.16 secs;  degressed to 19.3" with CGA but performed after long overground walk which may increased fatigue;  25.6" with FWW and 22.23" without AD (CGA) Goal status: NOT MET  4.   Amb. 500' without device with CGA on flat even, uneven surfaces for increased community accessibility. Baseline: 230 feet and tolerated for 2 minutes and 18" (CGA); 460' with FWW and CGA and min verbal cues Goal status: NOT MET  5.  Pt will perform floor to stand transfer with UE support with SBA.  Baseline: Patient able to complete safely with CGA with use of UE support; cannot perform without UE support (06/08/2022) Goal status: NOT MET  6.  Independent in updated HEP for balance and strengthening exs.  Baseline: Provided Goal status: MET   ASSESSMENT:  CLINICAL IMPRESSION: PT is discharging from physical therapy given maximal achieved progress at this time. Patient met 4/10 goals including demonstrating independence in HEP with supervision of family as well as short term Merrilee Jansky balance goal and household ambulation goal. Patient did not achieve remaining goals given cognitive deficits limiting carryover between sessions. Therapist continues to emphasize home safety and use of walker to reduce risk for falls. Patient and caregiver verbalize understanding. Plan to dishcarge next session with final review of home program and goals.   OBJECTIVE IMPAIRMENTS: decreased activity tolerance, decreased balance, decreased knowledge of use of DME, decreased strength, and impaired vision/preception.   ACTIVITY LIMITATIONS:  carrying, bending, standing, squatting, transfers, and locomotion level  PARTICIPATION LIMITATIONS: meal prep, cleaning, laundry, driving, shopping, community activity, and yard work  PERSONAL FACTORS: Behavior pattern and 1-2 comorbidities: diplopia and mild impulsivity  are also affecting patient's functional outcome.   REHAB POTENTIAL: Good  CLINICAL DECISION MAKING: Evolving/moderate complexity  EVALUATION COMPLEXITY: Moderate  PLAN:  PT FREQUENCY: 2x/week - discharge today on 06/13/2022  PT DURATION: 8 weeks  - discharge today on 06/13/2022  PLANNED INTERVENTIONS: Therapeutic exercises, Therapeutic activity, Neuromuscular re-education, Balance training, Gait training, Patient/Family education, Self Care, Stair training, and DME instructions  PLAN FOR NEXT SESSION:  Not Applicable given discharge  Malachi Carl, PT, DPT 06/13/2022, 11:56 AM

## 2022-06-15 ENCOUNTER — Encounter: Payer: Medicare Other | Admitting: Occupational Therapy

## 2022-06-15 ENCOUNTER — Ambulatory Visit: Payer: Medicare Other | Admitting: Physical Therapy

## 2022-06-15 ENCOUNTER — Encounter: Payer: Medicare Other | Admitting: Speech Pathology

## 2022-06-20 ENCOUNTER — Encounter: Payer: Medicare Other | Admitting: Occupational Therapy

## 2022-06-20 ENCOUNTER — Ambulatory Visit: Payer: Medicare Other | Admitting: Physical Therapy

## 2022-06-20 ENCOUNTER — Encounter: Payer: Medicare Other | Admitting: Speech Pathology

## 2022-06-22 ENCOUNTER — Ambulatory Visit: Payer: Medicare Other | Admitting: Physical Therapy

## 2022-06-22 ENCOUNTER — Encounter: Payer: Medicare Other | Admitting: Occupational Therapy

## 2022-06-22 ENCOUNTER — Encounter: Payer: Medicare Other | Admitting: Speech Pathology

## 2022-06-27 ENCOUNTER — Encounter: Payer: Medicare Other | Admitting: Speech Pathology

## 2022-06-27 ENCOUNTER — Ambulatory Visit: Payer: Medicare Other | Admitting: Physical Therapy

## 2022-06-27 ENCOUNTER — Ambulatory Visit: Payer: Medicare Other | Admitting: Speech Pathology

## 2022-06-27 ENCOUNTER — Encounter: Payer: Medicare Other | Admitting: Occupational Therapy

## 2022-06-29 ENCOUNTER — Ambulatory Visit: Payer: Medicare Other | Admitting: Physical Therapy

## 2022-06-29 ENCOUNTER — Encounter: Payer: Medicare Other | Admitting: Occupational Therapy

## 2022-06-29 ENCOUNTER — Encounter: Payer: Medicare Other | Admitting: Speech Pathology

## 2022-06-30 ENCOUNTER — Encounter: Payer: Self-pay | Admitting: Physical Medicine & Rehabilitation

## 2022-06-30 ENCOUNTER — Encounter: Payer: Medicare Other | Admitting: Physical Medicine & Rehabilitation

## 2022-06-30 VITALS — BP 126/73 | HR 67 | Ht 69.0 in | Wt 142.0 lb

## 2022-06-30 DIAGNOSIS — H539 Unspecified visual disturbance: Secondary | ICD-10-CM

## 2022-06-30 DIAGNOSIS — S72091A Other fracture of head and neck of right femur, initial encounter for closed fracture: Secondary | ICD-10-CM | POA: Diagnosis not present

## 2022-06-30 DIAGNOSIS — I69398 Other sequelae of cerebral infarction: Secondary | ICD-10-CM | POA: Insufficient documentation

## 2022-06-30 NOTE — Progress Notes (Signed)
Subjective:    Patient ID: Adam Arellano, male    DOB: 06-27-1931, 89 y.o.   MRN: [id number] Pt performed sit<>stand and stand pivot transfers using RW throughout session with supervision especially for impulsivity and RW mgmt.  Pt ambulated >150 ft x3 using RW with supervision and w/c follow for fatigue. Demonstrated intermittent forward placement of RW requiring vc/ tc for improved proximity to walker.      89 y.o. male  who was found down at his home my his neighbor on 04/02/2022. He presented to the [facility] ED with right-sided weakness, mild dysarthria and right hemianopsia on evaluation. Imaging was significant for left PICA infarct. No apparent injuries. Neurology consulted and resumed his Plavix and started aspirin 81 mg for 3 months then aspirin alone. Outside of window for tenecteplase and no evidence of LVO. Recommended resuming home statin; however, per PCP notes, the patient has stopped his home Crestor due to "aches".  SLP eval and noted expressive aphasia.   Admit date: 04/07/2022 Discharge date: 04/21/2022 HPI Walking at home without walker .  Finished OP therapy , dressing and bathing with supervision Daughter assist pt at home, continues with daily walking in home for exercise Planning to see Optho in July,2024, discussed CVA related vision issues common with PCA infarcts No falls reported Pt climbs steps, no longer drives Function in UE limited by chronic rotator cuff tears  Pain Inventory Average Pain  No pain Pain Right Now  no pain My pain is no pain  LOCATION OF PAIN  No pain  BOWEL Number of stools per week: 4 Oral laxative use Yes  Type of laxative Miralax  Enema or suppository use Yes    BLADDER Pads   Mobility walk with assistance ability to climb steps?  yes do you drive?  no  Function retired I need assistance with the following:  bathing, meal prep, household duties, and shopping Do you have any goals in this area?   yes  Neuro/Psych bowel control problems trouble walking confusion  Prior Studies Any changes since last visit?  no  Physicians involved in your care Any changes since last visit?  no   Family History  Problem Relation Age of Onset   CAD Neg Hx    Social History   Socioeconomic History   Marital status: Married    Spouse name: Not on file   Number of children: Not on file   Years of education: Not on file   Highest education level: Not on file  Occupational History   Not on file  Tobacco Use   Smoking status: Former    Types: Cigarettes    Quit date: 1980    Years since quitting: 44.0   Smokeless tobacco: Never   Tobacco comments:    Smoked for 15 years  Vaping Use   Vaping Use: Never used  Substance and Sexual Activity   Alcohol use: Not Currently    Comment: rare   Drug use: No   Sexual activity: Not on file  Other Topics Concern   Not on file  Social History Narrative   Not on file   Social Determinants of Health   Financial Resource Strain: Not on file  Food Insecurity: No Food Insecurity (04/03/2022)   Hunger Vital Sign    Worried About Running Out of Food in the Last Year: Never true    Ran Out of Food in the Last Year: Never true  Transportation Needs: No Transportation Needs (04/03/2022)  PRAPARE - Hydrologist (Medical): No    Lack of Transportation (Non-Medical): No  Physical Activity: Not on file  Stress: Not on file  Social Connections: Not on file   Past Surgical History:  Procedure Laterality Date   CARDIAC CATHETERIZATION     COLONOSCOPY     CORONARY STENT INTERVENTION N/A 08/06/2020   Procedure: CORONARY STENT INTERVENTION;  Surgeon: Sherren Mocha, MD;  Location: [place] [facility];  Service: Cardiovascular;  Laterality: N/A;   EYE SURGERY Bilateral    cataracts removed   MASTOIDECTOMY     L ear   MITRAL VALVE REPAIR     MITRAL VALVE REPAIR N/A 08/19/2020   Procedure: MITRAL VALVE REPAIR;  Surgeon:  Sherren Mocha, MD;  Location: [place] [facility];  Service: Cardiovascular;  Laterality: N/A;   PROSTATECTOMY     RIGHT/LEFT HEART CATH AND CORONARY ANGIOGRAPHY N/A 08/06/2020   Procedure: RIGHT/LEFT HEART CATH AND CORONARY ANGIOGRAPHY;  Surgeon: Sherren Mocha, MD;  Location: [place] [facility];  Service: Cardiovascular;  Laterality: N/A;   TEE WITHOUT CARDIOVERSION N/A 08/06/2020   Procedure: TRANSESOPHAGEAL ECHOCARDIOGRAM (TEE);  Surgeon: Sanda Klein, MD;  Location: [facility] ENDOSCOPY;  Service: Cardiovascular;  Laterality: N/A;   TEE WITHOUT CARDIOVERSION  08/19/2020   TEE WITHOUT CARDIOVERSION N/A 08/19/2020   Procedure: TRANSESOPHAGEAL ECHOCARDIOGRAM (TEE);  Surgeon: Sherren Mocha, MD;  Location: [place] [facility];  Service: Cardiovascular;  Laterality: N/A;   Past Medical History:  Diagnosis Date   Asthma    a. Childhood.   Coronary artery disease    a. NSTEMI s/p DES to dRCA 2007. b. Angina 07/2009: s/p DESx2 to mid RCA for ISR and prox RCA, EF 65%. c. Stress test 05/2013: low risk, no evidence of ischemia, EF 61%.   Dyslipidemia    GERD (gastroesophageal reflux disease)    History of kidney stones    Hypertension    Peripheral vascular disease (HCC)    Prostate cancer ([place])    a. s/p radical prostatectomy.   PVC's (premature ventricular contractions)    S/P mitral valve clip implantation 08/19/2020   s/p MitraClip implantation with a XTW by Dr. Burt Knack    Transient global amnesia    a. Adm 2009: imaging nonacute, had resolved.   Ht '5\' 9"'$  (1.753 m)   Wt 142 lb (64.4 kg)   BMI 20.97 kg/m   Opioid Risk Score:   Fall Risk Score:  `1  Depression screen [facility] 2/9     06/30/2022    9:38 AM 05/19/2022   11:03 AM  Depression screen PHQ 2/9  Decreased Interest 0 0  Down, Depressed, Hopeless 0 1  PHQ - 2 Score 0 1  Altered sleeping  0  Tired, decreased energy  1  Change in appetite  0  Feeling bad or failure about yourself   0  Trouble concentrating  0  Moving slowly or  fidgety/restless  3  Suicidal thoughts  0  PHQ-9 Score  5  Difficult doing work/chores  Not difficult at all    Review of Systems  Genitourinary:        Incontinence  Musculoskeletal:  Positive for gait problem.  Psychiatric/Behavioral:  Positive for confusion.       Objective:   Physical Exam  General no acute distress Mood and affect appropriate Oriented to person place and time Patient does not recall me from the hospital thought he saw me in the emergency department.  Generally does recall being in  the rehab department. Extraocular muscles intact Normal sensation in the upper limbs and facial region. Intact shoulder shrug and head turning No evidence of nystagmus on lateral gaze.  No evidence of I and O No evidence of visuospatial neglect Motor strength is 3 - bilateral deltoid 4 - bilateral bicep tricep limited by shoulder pain 5/5 bilateral grip 5/5 bilateral hip flexor knee extensor and ankle dorsiflexor Negative straight leg raising bilaterally Ambulates without assistive device no evidence of toe drag or knee instability Sit to stand independently is able to stand with feet together and eyes closed.       Assessment & Plan:  1.  History of left posterior cerebral artery infarct main residual issues mild field cut noted by family but not obvious on examination today asked patient to follow-up with ophthalmology and get visual field testing. 2.  Cognitive deficits some pre-existing some poststroke.  May get some general improvements with this over time with plateau approximately 1 year post stroke. Physical medicine rehab follow-up on as-needed basis, advised patient and daughter to continue with daily walking program

## 2022-06-30 NOTE — Patient Instructions (Signed)
Would rec visual field testing at next ophthalmology visit

## 2022-07-01 ENCOUNTER — Emergency Department (HOSPITAL_BASED_OUTPATIENT_CLINIC_OR_DEPARTMENT_OTHER): Payer: Medicare Other | Admitting: Radiology

## 2022-07-01 ENCOUNTER — Inpatient Hospital Stay (HOSPITAL_BASED_OUTPATIENT_CLINIC_OR_DEPARTMENT_OTHER)
Admission: EM | Admit: 2022-07-01 | Discharge: 2022-07-13 | DRG: 522 | Disposition: A | Discharge status: Home Health | Payer: Medicare Other | Attending: Internal Medicine | Admitting: Internal Medicine

## 2022-07-01 ENCOUNTER — Emergency Department (HOSPITAL_BASED_OUTPATIENT_CLINIC_OR_DEPARTMENT_OTHER): Payer: Medicare Other

## 2022-07-01 ENCOUNTER — Other Ambulatory Visit: Payer: Self-pay

## 2022-07-01 ENCOUNTER — Encounter (HOSPITAL_COMMUNITY): Payer: Self-pay

## 2022-07-01 ENCOUNTER — Encounter (HOSPITAL_BASED_OUTPATIENT_CLINIC_OR_DEPARTMENT_OTHER): Payer: Self-pay | Admitting: Emergency Medicine

## 2022-07-01 DIAGNOSIS — Y9259 Other trade areas as the place of occurrence of the external cause: Secondary | ICD-10-CM

## 2022-07-01 DIAGNOSIS — Z87891 Personal history of nicotine dependence: Secondary | ICD-10-CM

## 2022-07-01 DIAGNOSIS — K219 Gastro-esophageal reflux disease without esophagitis: Secondary | ICD-10-CM | POA: Diagnosis present

## 2022-07-01 DIAGNOSIS — F0390 Unspecified dementia without behavioral disturbance: Secondary | ICD-10-CM | POA: Diagnosis present

## 2022-07-01 DIAGNOSIS — E785 Hyperlipidemia, unspecified: Secondary | ICD-10-CM | POA: Diagnosis present

## 2022-07-01 DIAGNOSIS — W010XXA Fall on same level from slipping, tripping and stumbling without subsequent striking against object, initial encounter: Secondary | ICD-10-CM | POA: Diagnosis present

## 2022-07-01 DIAGNOSIS — W1830XA Fall on same level, unspecified, initial encounter: Secondary | ICD-10-CM | POA: Diagnosis present

## 2022-07-01 DIAGNOSIS — D539 Nutritional anemia, unspecified: Secondary | ICD-10-CM | POA: Diagnosis present

## 2022-07-01 DIAGNOSIS — I129 Hypertensive chronic kidney disease with stage 1 through stage 4 chronic kidney disease, or unspecified chronic kidney disease: Secondary | ICD-10-CM | POA: Diagnosis present

## 2022-07-01 DIAGNOSIS — K5900 Constipation, unspecified: Secondary | ICD-10-CM | POA: Diagnosis present

## 2022-07-01 DIAGNOSIS — Z955 Presence of coronary angioplasty implant and graft: Secondary | ICD-10-CM

## 2022-07-01 DIAGNOSIS — R4701 Aphasia: Secondary | ICD-10-CM | POA: Diagnosis present

## 2022-07-01 DIAGNOSIS — I2510 Atherosclerotic heart disease of native coronary artery without angina pectoris: Secondary | ICD-10-CM | POA: Diagnosis present

## 2022-07-01 DIAGNOSIS — J45909 Unspecified asthma, uncomplicated: Secondary | ICD-10-CM | POA: Diagnosis present

## 2022-07-01 DIAGNOSIS — D75838 Other thrombocytosis: Secondary | ICD-10-CM | POA: Diagnosis not present

## 2022-07-01 DIAGNOSIS — Z66 Do not resuscitate: Secondary | ICD-10-CM | POA: Diagnosis present

## 2022-07-01 DIAGNOSIS — R32 Unspecified urinary incontinence: Secondary | ICD-10-CM | POA: Diagnosis present

## 2022-07-01 DIAGNOSIS — E8809 Other disorders of plasma-protein metabolism, not elsewhere classified: Secondary | ICD-10-CM | POA: Diagnosis present

## 2022-07-01 DIAGNOSIS — Z9079 Acquired absence of other genital organ(s): Secondary | ICD-10-CM

## 2022-07-01 DIAGNOSIS — Z7902 Long term (current) use of antithrombotics/antiplatelets: Secondary | ICD-10-CM

## 2022-07-01 DIAGNOSIS — D72828 Other elevated white blood cell count: Secondary | ICD-10-CM | POA: Diagnosis present

## 2022-07-01 DIAGNOSIS — Z8673 Personal history of transient ischemic attack (TIA), and cerebral infarction without residual deficits: Secondary | ICD-10-CM

## 2022-07-01 DIAGNOSIS — Z79899 Other long term (current) drug therapy: Secondary | ICD-10-CM

## 2022-07-01 DIAGNOSIS — S72091A Other fracture of head and neck of right femur, initial encounter for closed fracture: Principal | ICD-10-CM | POA: Diagnosis present

## 2022-07-01 DIAGNOSIS — S72001A Fracture of unspecified part of neck of right femur, initial encounter for closed fracture: Secondary | ICD-10-CM | POA: Diagnosis present

## 2022-07-01 DIAGNOSIS — L89152 Pressure ulcer of sacral region, stage 2: Secondary | ICD-10-CM | POA: Diagnosis not present

## 2022-07-01 DIAGNOSIS — I10 Essential (primary) hypertension: Secondary | ICD-10-CM | POA: Diagnosis present

## 2022-07-01 DIAGNOSIS — Z8546 Personal history of malignant neoplasm of prostate: Secondary | ICD-10-CM

## 2022-07-01 DIAGNOSIS — N1832 Chronic kidney disease, stage 3b: Secondary | ICD-10-CM | POA: Diagnosis present

## 2022-07-01 DIAGNOSIS — Z7982 Long term (current) use of aspirin: Secondary | ICD-10-CM

## 2022-07-01 DIAGNOSIS — I739 Peripheral vascular disease, unspecified: Secondary | ICD-10-CM | POA: Diagnosis present

## 2022-07-01 DIAGNOSIS — I252 Old myocardial infarction: Secondary | ICD-10-CM

## 2022-07-01 HISTORY — DX: Fracture of unspecified part of neck of right femur, initial encounter for closed fracture: S72.001A

## 2022-07-01 HISTORY — DX: Cerebral infarction, unspecified: I63.9

## 2022-07-01 LAB — CBC WITH DIFFERENTIAL/PLATELET
Abs Immature Granulocytes: 0.04 10*3/uL (ref 0.00–0.07)
Basophils Absolute: 0 10*3/uL (ref 0.0–0.1)
Basophils Relative: 0 %
Eosinophils Absolute: 0.1 10*3/uL (ref 0.0–0.5)
Eosinophils Relative: 1 %
HCT: 35.9 % — ABNORMAL LOW (ref 39.0–52.0)
Hemoglobin: 11.7 g/dL — ABNORMAL LOW (ref 13.0–17.0)
Immature Granulocytes: 0 %
Lymphocytes Relative: 9 %
Lymphs Abs: 0.9 10*3/uL (ref 0.7–4.0)
MCH: 31.2 pg (ref 26.0–34.0)
MCHC: 32.6 g/dL (ref 30.0–36.0)
MCV: 95.7 fL (ref 80.0–100.0)
Monocytes Absolute: 0.6 10*3/uL (ref 0.1–1.0)
Monocytes Relative: 6 %
Neutro Abs: 8.4 10*3/uL — ABNORMAL HIGH (ref 1.7–7.7)
Neutrophils Relative %: 84 %
Platelets: 255 10*3/uL (ref 150–400)
RBC: 3.75 MIL/uL — ABNORMAL LOW (ref 4.22–5.81)
RDW: 12.9 % (ref 11.5–15.5)
WBC: 10 10*3/uL (ref 4.0–10.5)
nRBC: 0 % (ref 0.0–0.2)

## 2022-07-01 LAB — PROTIME-INR
INR: 1 (ref 0.8–1.2)
Prothrombin Time: 13.5 seconds (ref 11.4–15.2)

## 2022-07-01 LAB — BASIC METABOLIC PANEL
Anion gap: 9 (ref 5–15)
BUN: 38 mg/dL — ABNORMAL HIGH (ref 8–23)
CO2: 28 mmol/L (ref 22–32)
Calcium: 8.9 mg/dL (ref 8.9–10.3)
Chloride: 103 mmol/L (ref 98–111)
Creatinine, Ser: 1.87 mg/dL — ABNORMAL HIGH (ref 0.61–1.24)
GFR, Estimated: 34 mL/min — ABNORMAL LOW (ref >60)
Glucose, Bld: 113 mg/dL — ABNORMAL HIGH (ref 70–99)
Potassium: 3.9 mmol/L (ref 3.5–5.1)
Sodium: 140 mmol/L (ref 135–145)

## 2022-07-01 MED ORDER — HYDROMORPHONE HCL 1 MG/ML IJ SOLN
1.0000 mg | Freq: Once | INTRAMUSCULAR | Status: AC
  Administered 2022-07-01: 1 mg via INTRAVENOUS
  Filled 2022-07-01: qty 1

## 2022-07-01 MED ORDER — FENTANYL CITRATE PF 50 MCG/ML IJ SOSY
50.0000 ug | PREFILLED_SYRINGE | INTRAMUSCULAR | Status: AC | PRN
  Administered 2022-07-01 (×2): 50 ug via INTRAVENOUS
  Filled 2022-07-01 (×2): qty 1

## 2022-07-01 NOTE — ED Provider Notes (Signed)
[facility] Provider Note   CSN: [id number] Arrival date & time: 07/01/22  1453     History  Chief Complaint  Patient presents with   Paulino Cork is a 89 y.o. male.  The history is provided by the patient, a relative and medical records. No language interpreter was used.  Fall     89 year old male significant history of CAD, peripheral artery disease, prior stroke currently on Plavix, hypertension, brought to the ER for evaluation of a fall.  History obtained through daughter who is at bedside.  Patient does have history of dementia.  Per daughter, prior to arrival patient was walking reaching towards the door and while has pulled the door he lost control fell and landed on his right hip.  He did not hit his head no loss of consciousness.  He was unable to stand up due to the pain.  His primary pain is to his right hip only.  Pain is sharp achy throbbing worse with movement.  He does not complain of any headache, neck pain, chest pain, shortness of breath, abdominal pain denies any precipitating symptoms prior to the fall.  He is currently on Plavix.  No numbness no weakness.  Home Medications Prior to Admission medications   Medication Sig Start Date End Date Taking? Authorizing Provider  acetaminophen (TYLENOL) 325 MG tablet Take 1-2 tablets (325-650 mg total) by mouth every 4 (four) hours as needed for mild pain. 04/21/22   Setzer, Edman Circle, PA-C  aspirin EC 81 MG tablet Take 81 mg by mouth daily. Swallow whole.    [provider]  ciprofloxacin-dexamethasone (CIPRODEX) OTIC suspension Place 4 drops into the left ear 2 (two) times daily as needed (ear infection).    [provider]  clopidogrel (PLAVIX) 75 MG tablet Take 1 tablet (75 mg total) by mouth daily. 05/19/22   Jerline Pain, MD  docusate sodium (COLACE) 100 MG capsule Take 1 capsule (100 mg total) by mouth 2 (two) times daily. 04/21/22   Setzer,  Edman Circle, PA-C  FIBER PO Take 1 capsule by mouth daily.    [provider]  furosemide (LASIX) 20 MG tablet Take 1 tablet (20 mg total) by mouth daily. 05/19/22   Jerline Pain, MD  gabapentin (NEURONTIN) 100 MG capsule Take 1 capsule (100 mg total) by mouth at bedtime. 04/21/22   Setzer, Edman Circle, PA-C  losartan (COZAAR) 25 MG tablet TAKE 1 TABLET DAILY 01/16/22   Jerline Pain, MD  Menthol-Methyl Salicylate (MUSCLE RUB) 10-15 % CREA Apply 1 Application topically 2 (two) times daily as needed for muscle pain. 04/21/22   Setzer, Edman Circle, PA-C  metoprolol tartrate (LOPRESSOR) 25 MG tablet Take 25 mg by mouth 2 (two) times daily. 10/24/13   Jerline Pain, MD  Multiple Vitamin (MULTIVITAMIN WITH MINERALS) TABS tablet Take 1 tablet by mouth daily.    [provider]  nitroGLYCERIN (NITROSTAT) 0.4 MG SL tablet PLACE 1 TABLET UNDER THE TONGUE AT ONSET OF CHEST PAIN EVERY 5 MINTUES UP TO 3 TIMES AS NEEDED 08/03/21   Eileen Stanford, PA-C  polyethylene glycol (MIRALAX / GLYCOLAX) 17 g packet Take 17 g by mouth daily as needed for mild constipation. 04/21/22   Setzer, Edman Circle, PA-C  Polyvinyl Alcohol-Povidone (REFRESH OP) Place 1 drop into both eyes daily as needed (dry eye).    [provider]      Allergies    Simvastatin,  Atorvastatin, Lisinopril, and Rosuvastatin    Review of Systems   Review of Systems  All other systems reviewed and are negative.   Physical Exam Updated Vital Signs BP 127/66 (BP Location: Left Arm)   Pulse 68   Temp 98.2 F (36.8 C)   Resp 17   SpO2 100%  Physical Exam Vitals and nursing note reviewed.  Constitutional:      General: He is not in acute distress.    Appearance: He is well-developed.     Comments: Elderly male hard of hearing laying in bed appears to be in no acute discomfort.  HENT:     Head: Normocephalic and atraumatic.  Eyes:     Extraocular Movements: Extraocular movements intact.     Conjunctiva/sclera:  Conjunctivae normal.     Pupils: Pupils are equal, round, and reactive to light.  Cardiovascular:     Rate and Rhythm: Normal rate and regular rhythm.     Pulses: Normal pulses.     Heart sounds: Normal heart sounds.  Pulmonary:     Effort: Pulmonary effort is normal.     Breath sounds: Normal breath sounds.  Abdominal:     Palpations: Abdomen is soft.     Tenderness: There is no abdominal tenderness.  Musculoskeletal:        General: Tenderness (Right hip: Tenderness about the hip with decreased range of motion but no shortening of the leg or external rotation of the leg.) present.     Cervical back: Normal range of motion and neck supple.     Comments: No midline spine tenderness  Skin:    Findings: No rash.  Neurological:     Mental Status: He is alert. Mental status is at baseline.     ED Results / Procedures / Treatments   Labs (all labs ordered are listed, but only abnormal results are displayed) Labs Reviewed  BASIC METABOLIC PANEL - Abnormal; Notable for the following components:      Result Value   Glucose, Bld 113 (*)    BUN 38 (*)    Creatinine, Ser 1.87 (*)    GFR, Estimated 34 (*)    All other components within normal limits  CBC WITH DIFFERENTIAL/PLATELET - Abnormal; Notable for the following components:   RBC 3.75 (*)    Hemoglobin 11.7 (*)    HCT 35.9 (*)    Neutro Abs 8.4 (*)    All other components within normal limits  PROTIME-INR  TYPE AND SCREEN    EKG None  Radiology DG HIP UNILAT WITH PELVIS 2-3 VIEWS RIGHT  Result Date: 07/01/2022 CLINICAL DATA:  Pain EXAM: DG HIP (WITH OR WITHOUT PELVIS) 2-3V RIGHT COMPARISON:  04/02/2022 FINDINGS: Acute fracture of the right femoral neck with mild proximal migration and external rotation. Right hip joint remains intact without dislocation. Mild osteoarthritic changes of both hips. No evidence of pelvic fracture or diastasis. Bones are demineralized. No suspicious bone lesion is identified. IMPRESSION: Acute  right femoral neck fracture. Electronically Signed   By: Davina Poke D.O.   On: 07/01/2022 16:56    Procedures Procedures    Medications Ordered in ED Medications  fentaNYL (SUBLIMAZE) injection 50 mcg (50 mcg Intravenous Given 07/01/22 1818)  HYDROmorphone (DILAUDID) injection 1 mg (1 mg Intravenous Given 07/01/22 1944)    ED Course/ Medical Decision Making/ A&P  Medical Decision Making Amount and/or Complexity of Data Reviewed Labs: ordered. Radiology: ordered.  Risk Prescription drug management. Decision regarding hospitalization.   BP 127/66 (BP Location: Left Arm)   Pulse 68   Temp 98.2 F (36.8 C)   Resp 17   SpO2 100%   10:15 PM  89 year old male significant history of CAD, peripheral artery disease, prior stroke currently on Plavix, hypertension, brought to the ER for evaluation of a fall.  History obtained through daughter who is at bedside.  Patient does have history of dementia.  Per daughter, prior to arrival patient was walking reaching towards the door and while has pulled the door he lost control fell and landed on his right hip.  He did not hit his head no loss of consciousness.  He was unable to stand up due to the pain.  His primary pain is to his right hip only.  Pain is sharp achy throbbing worse with movement.  He does not complain of any headache, neck pain, chest pain, shortness of breath, abdominal pain denies any precipitating symptoms prior to the fall.  He is currently on Plavix.  No numbness no weakness.  On exam, patient is laying in bed appears to be in no acute discomfort.  His only tenderness is about his right hip with decreased range of motion but no obvious deformity noted.  He is neurovascularly intact to his right lower extremity.  He does not have any midline spine tenderness no other injury.  -Labs ordered, independently viewed and interpreted by me.  Labs remarkable for Cr. 1.87, similar to baseline.   -The  patient was maintained on a cardiac monitor.  I personally viewed and interpreted the cardiac monitored which showed an underlying rhythm of: NSR -Imaging independently viewed and interpreted by me and I agree with radiologist's interpretation.  Result remarkable for R hip xray showing R femoral neck fx -This patient presents to the ED for concern of fall, this involves an extensive number of treatment options, and is a complaint that carries with it a high risk of complications and morbidity.  The differential diagnosis includes fracture, dislocation, strain, sprain -Co morbidities that complicate the patient evaluation includes prior stroke on Plavix, CAD, HTN, asthma -Treatment includes fentanyl -Reevaluation of the patient after these medicines showed that the patient improved -PCP office notes or outside notes reviewed -Discussion with specialist orthopedist Dr. Zachery Dakins who agrees with surgical management.  SPoke with hospitalist Dr. Velia Meyer who agrees to admit pt to [facility] -Escalation to admission/observation considered: patient is comfortable with admission and surgical management of his hip fx.   6:58 PM Patient fell and had a acute right femoral neck fracture.  This is a closed injury.  I have consulted on-call orthopedic specialist Dr. Zachery Dakins who request patient to be admitted over to [facility] for further management of his hip fracture.  Pt's took Plavix this AM.  He is still in some pain, additional pain medication given.          Final Clinical Impression(s) / ED Diagnoses Final diagnoses:  Closed displaced fracture of right femoral neck [facility])    Rx / DC Orders ED Discharge Orders     None         Domenic Moras, PA-C 07/01/22 1947    Tegeler, Gwenyth Allegra, MD 07/01/22 2048

## 2022-07-01 NOTE — ED Notes (Signed)
Patient transported to CT 

## 2022-07-01 NOTE — Progress Notes (Signed)
Hospitalist Transfer Note:  Transferring facility: [facility] Requesting provider: Domenic Moras, PA (EDP at [facility]) Reason for transfer: admission for further evaluation and management of acute right femoral neck fracture, following, mechanical fall.    89  year old M with medical history notable for CAD, PVD, stroke, who presented to [facility] ED complaining of acute right hip pain stemming from mechanical ground-level fall that occurred earlier today, when patient tripped while ambulating.  Did not hit his head and and not associated w/ loss conscious. imaging confirmed closed right femoral neck fracture.  It is noted that he is on daily Plavix, and confirms that his most recent dose of such occurred on the morning of 07/01/2022.  No additional reported blood thinners as an outpatient.  EDP discussed with on-call orthopedic surgery, Dr. Zachery Dakins, who will formally consult and see the patient in the morning at [facility].   Subsequently, I accepted this patient for transfer for inpatient admission to a med-tele bed at [facility] for further work-up and management of the above .      Check [email or web address] for on-call coverage.   Nursing staff, Please call [facility] number on Amion as soon as patient's arrival, so appropriate admitting provider can evaluate the pt.     Babs Bertin, DO Hospitalist

## 2022-07-01 NOTE — ED Triage Notes (Signed)
Fall onto right hip, pain,  Able to move foot at ankle  Happened around 1pm On plavix and asa

## 2022-07-02 DIAGNOSIS — S72091A Other fracture of head and neck of right femur, initial encounter for closed fracture: Secondary | ICD-10-CM | POA: Diagnosis present

## 2022-07-02 DIAGNOSIS — S72001A Fracture of unspecified part of neck of right femur, initial encounter for closed fracture: Secondary | ICD-10-CM | POA: Diagnosis not present

## 2022-07-02 DIAGNOSIS — D539 Nutritional anemia, unspecified: Secondary | ICD-10-CM | POA: Diagnosis present

## 2022-07-02 DIAGNOSIS — W010XXA Fall on same level from slipping, tripping and stumbling without subsequent striking against object, initial encounter: Secondary | ICD-10-CM | POA: Diagnosis present

## 2022-07-02 DIAGNOSIS — Y9259 Other trade areas as the place of occurrence of the external cause: Secondary | ICD-10-CM | POA: Diagnosis not present

## 2022-07-02 DIAGNOSIS — Z7902 Long term (current) use of antithrombotics/antiplatelets: Secondary | ICD-10-CM | POA: Diagnosis not present

## 2022-07-02 DIAGNOSIS — Z8673 Personal history of transient ischemic attack (TIA), and cerebral infarction without residual deficits: Secondary | ICD-10-CM | POA: Diagnosis not present

## 2022-07-02 DIAGNOSIS — R32 Unspecified urinary incontinence: Secondary | ICD-10-CM | POA: Diagnosis present

## 2022-07-02 DIAGNOSIS — S72041A Displaced fracture of base of neck of right femur, initial encounter for closed fracture: Secondary | ICD-10-CM | POA: Diagnosis not present

## 2022-07-02 DIAGNOSIS — I10 Essential (primary) hypertension: Secondary | ICD-10-CM | POA: Diagnosis not present

## 2022-07-02 DIAGNOSIS — Z79899 Other long term (current) drug therapy: Secondary | ICD-10-CM | POA: Diagnosis not present

## 2022-07-02 DIAGNOSIS — R4701 Aphasia: Secondary | ICD-10-CM | POA: Diagnosis present

## 2022-07-02 DIAGNOSIS — Z8546 Personal history of malignant neoplasm of prostate: Secondary | ICD-10-CM | POA: Diagnosis not present

## 2022-07-02 DIAGNOSIS — I252 Old myocardial infarction: Secondary | ICD-10-CM | POA: Diagnosis not present

## 2022-07-02 DIAGNOSIS — E782 Mixed hyperlipidemia: Secondary | ICD-10-CM | POA: Diagnosis not present

## 2022-07-02 DIAGNOSIS — D649 Anemia, unspecified: Secondary | ICD-10-CM | POA: Diagnosis not present

## 2022-07-02 DIAGNOSIS — I2510 Atherosclerotic heart disease of native coronary artery without angina pectoris: Secondary | ICD-10-CM | POA: Diagnosis present

## 2022-07-02 DIAGNOSIS — Z955 Presence of coronary angioplasty implant and graft: Secondary | ICD-10-CM | POA: Diagnosis not present

## 2022-07-02 DIAGNOSIS — W1830XA Fall on same level, unspecified, initial encounter: Secondary | ICD-10-CM

## 2022-07-02 DIAGNOSIS — N1832 Chronic kidney disease, stage 3b: Secondary | ICD-10-CM

## 2022-07-02 DIAGNOSIS — I739 Peripheral vascular disease, unspecified: Secondary | ICD-10-CM | POA: Diagnosis present

## 2022-07-02 DIAGNOSIS — E785 Hyperlipidemia, unspecified: Secondary | ICD-10-CM | POA: Diagnosis present

## 2022-07-02 DIAGNOSIS — G454 Transient global amnesia: Secondary | ICD-10-CM | POA: Insufficient documentation

## 2022-07-02 DIAGNOSIS — Z66 Do not resuscitate: Secondary | ICD-10-CM | POA: Diagnosis present

## 2022-07-02 DIAGNOSIS — I129 Hypertensive chronic kidney disease with stage 1 through stage 4 chronic kidney disease, or unspecified chronic kidney disease: Secondary | ICD-10-CM | POA: Diagnosis present

## 2022-07-02 DIAGNOSIS — D72828 Other elevated white blood cell count: Secondary | ICD-10-CM | POA: Diagnosis present

## 2022-07-02 DIAGNOSIS — Z7982 Long term (current) use of aspirin: Secondary | ICD-10-CM | POA: Diagnosis not present

## 2022-07-02 DIAGNOSIS — Z9079 Acquired absence of other genital organ(s): Secondary | ICD-10-CM | POA: Insufficient documentation

## 2022-07-02 DIAGNOSIS — K5900 Constipation, unspecified: Secondary | ICD-10-CM | POA: Diagnosis present

## 2022-07-02 DIAGNOSIS — L89152 Pressure ulcer of sacral region, stage 2: Secondary | ICD-10-CM | POA: Diagnosis not present

## 2022-07-02 DIAGNOSIS — F0390 Unspecified dementia without behavioral disturbance: Secondary | ICD-10-CM | POA: Diagnosis present

## 2022-07-02 DIAGNOSIS — J45909 Unspecified asthma, uncomplicated: Secondary | ICD-10-CM | POA: Diagnosis present

## 2022-07-02 DIAGNOSIS — Z87891 Personal history of nicotine dependence: Secondary | ICD-10-CM | POA: Diagnosis not present

## 2022-07-02 DIAGNOSIS — K219 Gastro-esophageal reflux disease without esophagitis: Secondary | ICD-10-CM | POA: Diagnosis present

## 2022-07-02 LAB — CBC
HCT: 39.9 % (ref 39.0–52.0)
Hemoglobin: 12.7 g/dL — ABNORMAL LOW (ref 13.0–17.0)
MCH: 31.1 pg (ref 26.0–34.0)
MCHC: 31.8 g/dL (ref 30.0–36.0)
MCV: 97.8 fL (ref 80.0–100.0)
Platelets: 241 10*3/uL (ref 150–400)
RBC: 4.08 MIL/uL — ABNORMAL LOW (ref 4.22–5.81)
RDW: 12.8 % (ref 11.5–15.5)
WBC: 14 10*3/uL — ABNORMAL HIGH (ref 4.0–10.5)
nRBC: 0 % (ref 0.0–0.2)

## 2022-07-02 LAB — BASIC METABOLIC PANEL
Anion gap: 9 (ref 5–15)
BUN: 33 mg/dL — ABNORMAL HIGH (ref 8–23)
CO2: 26 mmol/L (ref 22–32)
Calcium: 8.6 mg/dL — ABNORMAL LOW (ref 8.9–10.3)
Chloride: 104 mmol/L (ref 98–111)
Creatinine, Ser: 1.77 mg/dL — ABNORMAL HIGH (ref 0.61–1.24)
GFR, Estimated: 36 mL/min — ABNORMAL LOW (ref >60)
Glucose, Bld: 121 mg/dL — ABNORMAL HIGH (ref 70–99)
Potassium: 3.6 mmol/L (ref 3.5–5.1)
Sodium: 139 mmol/L (ref 135–145)

## 2022-07-02 LAB — PROTIME-INR
INR: 1.2 (ref 0.8–1.2)
Prothrombin Time: 14.6 seconds (ref 11.4–15.2)

## 2022-07-02 LAB — MAGNESIUM: Magnesium: 2.3 mg/dL (ref 1.7–2.4)

## 2022-07-02 LAB — PROCALCITONIN: Procalcitonin: <0.1 ng/mL

## 2022-07-02 MED ORDER — DOCUSATE SODIUM 100 MG PO CAPS
100.0000 mg | ORAL_CAPSULE | Freq: Two times a day (BID) | ORAL | Status: DC
  Administered 2022-07-02 – 2022-07-13 (×22): 100 mg via ORAL
  Filled 2022-07-02 (×22): qty 1

## 2022-07-02 MED ORDER — ONDANSETRON HCL 4 MG/2ML IJ SOLN: 4.0000 mg | Freq: Four times a day (QID) | INTRAMUSCULAR | Status: DC | PRN

## 2022-07-02 MED ORDER — ACETAMINOPHEN 500 MG PO TABS
1000.0000 mg | ORAL_TABLET | Freq: Once | ORAL | Status: AC
  Administered 2022-07-03: 1000 mg via ORAL
  Filled 2022-07-02: qty 2

## 2022-07-02 MED ORDER — ACETAMINOPHEN 650 MG RE SUPP: 650.0000 mg | Freq: Four times a day (QID) | RECTAL | Status: DC | PRN

## 2022-07-02 MED ORDER — POVIDONE-IODINE 10 % EX SWAB
2.0000 | Freq: Once | CUTANEOUS | Status: AC
  Administered 2022-07-03: 2 via TOPICAL

## 2022-07-02 MED ORDER — MORPHINE SULFATE (PF) 4 MG/ML IV SOLN
4.0000 mg | Freq: Once | INTRAVENOUS | Status: AC
  Administered 2022-07-02: 4 mg via INTRAVENOUS
  Filled 2022-07-02: qty 1

## 2022-07-02 MED ORDER — FUROSEMIDE 20 MG PO TABS: 20.0000 mg | ORAL_TABLET | Freq: Every day | ORAL | Status: DC

## 2022-07-02 MED ORDER — ONDANSETRON HCL 4 MG PO TABS: 4.0000 mg | ORAL_TABLET | Freq: Four times a day (QID) | ORAL | Status: DC | PRN

## 2022-07-02 MED ORDER — SENNOSIDES-DOCUSATE SODIUM 8.6-50 MG PO TABS: 1.0000 | ORAL_TABLET | Freq: Every evening | ORAL | Status: DC | PRN

## 2022-07-02 MED ORDER — SENNOSIDES-DOCUSATE SODIUM 8.6-50 MG PO TABS
2.0000 | ORAL_TABLET | Freq: Every day | ORAL | Status: DC
  Administered 2022-07-02 – 2022-07-12 (×11): 2 via ORAL
  Filled 2022-07-02 (×11): qty 2

## 2022-07-02 MED ORDER — HYDROMORPHONE HCL 1 MG/ML IJ SOLN
1.0000 mg | INTRAMUSCULAR | Status: DC | PRN
  Administered 2022-07-02 – 2022-07-03 (×5): 1 mg via INTRAVENOUS
  Filled 2022-07-02 (×5): qty 1

## 2022-07-02 MED ORDER — POLYETHYLENE GLYCOL 3350 17 G PO PACK: 17.0000 g | PACK | Freq: Every day | ORAL | Status: DC | PRN

## 2022-07-02 MED ORDER — ENOXAPARIN SODIUM 30 MG/0.3ML IJ SOSY
30.0000 mg | PREFILLED_SYRINGE | INTRAMUSCULAR | Status: DC
  Administered 2022-07-02 – 2022-07-13 (×11): 30 mg via SUBCUTANEOUS
  Filled 2022-07-02 (×11): qty 0.3

## 2022-07-02 MED ORDER — LOSARTAN POTASSIUM 25 MG PO TABS: 25.0000 mg | ORAL_TABLET | Freq: Every day | ORAL | Status: DC

## 2022-07-02 MED ORDER — ACETAMINOPHEN 325 MG PO TABS
650.0000 mg | ORAL_TABLET | Freq: Four times a day (QID) | ORAL | Status: DC | PRN
  Administered 2022-07-06 – 2022-07-11 (×5): 650 mg via ORAL
  Filled 2022-07-02 (×7): qty 2

## 2022-07-02 MED ORDER — HYDRALAZINE HCL 20 MG/ML IJ SOLN: 5.0000 mg | INTRAMUSCULAR | Status: DC | PRN

## 2022-07-02 MED ORDER — METOPROLOL TARTRATE 25 MG PO TABS
25.0000 mg | ORAL_TABLET | Freq: Two times a day (BID) | ORAL | Status: DC
  Administered 2022-07-02 – 2022-07-13 (×23): 25 mg via ORAL
  Filled 2022-07-02 (×24): qty 1

## 2022-07-02 MED ORDER — CEFAZOLIN SODIUM-DEXTROSE 2-4 GM/100ML-% IV SOLN
2.0000 g | INTRAVENOUS | Status: AC
  Administered 2022-07-03: 2 g via INTRAVENOUS
  Filled 2022-07-02: qty 100

## 2022-07-02 MED ORDER — LACTATED RINGERS IV SOLN: INTRAVENOUS | Status: DC

## 2022-07-02 MED ORDER — NITROGLYCERIN 0.4 MG SL SUBL: 0.4000 mg | SUBLINGUAL_TABLET | SUBLINGUAL | Status: DC | PRN

## 2022-07-02 MED ORDER — GABAPENTIN 100 MG PO CAPS
100.0000 mg | ORAL_CAPSULE | Freq: Every day | ORAL | Status: DC
  Administered 2022-07-02 – 2022-07-12 (×11): 100 mg via ORAL
  Filled 2022-07-02 (×11): qty 1

## 2022-07-02 MED ORDER — CHLORHEXIDINE GLUCONATE 4 % EX LIQD
60.0000 mL | Freq: Once | CUTANEOUS | Status: AC
  Administered 2022-07-03: 4 via TOPICAL
  Filled 2022-07-02: qty 60

## 2022-07-02 NOTE — H&P (Signed)
PCP:   Corliss Blacker, MD   Chief Complaint:  Fall, right hip pain  HPI: This is a 89 year old male with past medical history of CVA, prostate cancer radical prostatectomy, mitral valve clip, hypertension, peripheral vascular disease, CAD, dyslipidemia and cognitive issues.  Yesterday while leaving the bout[place] with his daughter he fell on his right hip.  He was unable to stand and in pain.  He did not hit his head, he had no loss of consciousness.  He had no chest pains or palpitations.  911 was called, he was taken to the ER.  Review of Systems:  The patient denies anorexia, fever, weight loss,, vision loss, decreased hearing, hoarseness, chest pain, syncope, dyspnea on exertion, peripheral edema, balance deficits, hemoptysis, abdominal pain, melena, hematochezia, severe indigestion/heartburn, hematuria, incontinence, genital sores, muscle weakness, suspicious skin lesions, transient blindness, depression, unusual weight change, abnormal bleeding, enlarged lymph nodes, angioedema, and breast masses. Positive: Right hip pain, inability to walk, cognitive issues  Past Medical History: Past Medical History:  Diagnosis Date   Asthma    a. Childhood.   Coronary artery disease    a. NSTEMI s/p DES to dRCA 2007. b. Angina 07/2009: s/p DESx2 to mid RCA for ISR and prox RCA, EF 65%. c. Stress test 05/2013: low risk, no evidence of ischemia, EF 61%.   CVA (cerebral vascular accident) ([place])    Dyslipidemia    GERD (gastroesophageal reflux disease)    History of kidney stones    Hypertension    Peripheral vascular disease (HCC)    Prostate cancer ([place])    a. s/p radical prostatectomy.   PVC's (premature ventricular contractions)    S/P mitral valve clip implantation 08/19/2020   s/p MitraClip implantation with a XTW by Dr. Burt Knack    Transient global amnesia    a. Adm 2009: imaging nonacute, had resolved.   Past Surgical History:  Procedure Laterality Date   CARDIAC CATHETERIZATION      COLONOSCOPY     CORONARY STENT INTERVENTION N/A 08/06/2020   Procedure: CORONARY STENT INTERVENTION;  Surgeon: Sherren Mocha, MD;  Location: [place] [facility];  Service: Cardiovascular;  Laterality: N/A;   EYE SURGERY Bilateral    cataracts removed   MASTOIDECTOMY     L ear   MITRAL VALVE REPAIR     MITRAL VALVE REPAIR N/A 08/19/2020   Procedure: MITRAL VALVE REPAIR;  Surgeon: Sherren Mocha, MD;  Location: [place] [facility];  Service: Cardiovascular;  Laterality: N/A;   PROSTATECTOMY     RIGHT/LEFT HEART CATH AND CORONARY ANGIOGRAPHY N/A 08/06/2020   Procedure: RIGHT/LEFT HEART CATH AND CORONARY ANGIOGRAPHY;  Surgeon: Sherren Mocha, MD;  Location: [place] [facility];  Service: Cardiovascular;  Laterality: N/A;   TEE WITHOUT CARDIOVERSION N/A 08/06/2020   Procedure: TRANSESOPHAGEAL ECHOCARDIOGRAM (TEE);  Surgeon: Sanda Klein, MD;  Location: [facility];  Service: Cardiovascular;  Laterality: N/A;   TEE WITHOUT CARDIOVERSION  08/19/2020   TEE WITHOUT CARDIOVERSION N/A 08/19/2020   Procedure: TRANSESOPHAGEAL ECHOCARDIOGRAM (TEE);  Surgeon: Sherren Mocha, MD;  Location: [place] [facility];  Service: Cardiovascular;  Laterality: N/A;    Medications: Prior to Admission medications   Medication Sig Start Date End Date Taking? Authorizing Provider  acetaminophen (TYLENOL) 325 MG tablet Take 1-2 tablets (325-650 mg total) by mouth every 4 (four) hours as needed for mild pain. 04/21/22   Setzer, Edman Circle, PA-C  aspirin EC 81 MG tablet Take 81 mg by mouth daily. Swallow whole.    [provider]  ciprofloxacin-dexamethasone (CIPRODEX) OTIC suspension Place 4 drops into the left ear 2 (two) times daily as needed (ear infection).    [provider]  clopidogrel (PLAVIX) 75 MG tablet Take 1 tablet (75 mg total) by mouth daily. 05/19/22   Jerline Pain, MD  docusate sodium (COLACE) 100 MG capsule Take 1 capsule (100 mg total) by mouth 2 (two) times daily. 04/21/22   Setzer,  Edman Circle, PA-C  FIBER PO Take 1 capsule by mouth daily.    [provider]  furosemide (LASIX) 20 MG tablet Take 1 tablet (20 mg total) by mouth daily. 05/19/22   Jerline Pain, MD  gabapentin (NEURONTIN) 100 MG capsule Take 1 capsule (100 mg total) by mouth at bedtime. 04/21/22   Setzer, Edman Circle, PA-C  losartan (COZAAR) 25 MG tablet TAKE 1 TABLET DAILY 01/16/22   Jerline Pain, MD  Menthol-Methyl Salicylate (MUSCLE RUB) 10-15 % CREA Apply 1 Application topically 2 (two) times daily as needed for muscle pain. 04/21/22   Setzer, Edman Circle, PA-C  metoprolol tartrate (LOPRESSOR) 25 MG tablet Take 25 mg by mouth 2 (two) times daily. 10/24/13   Jerline Pain, MD  Multiple Vitamin (MULTIVITAMIN WITH MINERALS) TABS tablet Take 1 tablet by mouth daily.    [provider]  nitroGLYCERIN (NITROSTAT) 0.4 MG SL tablet PLACE 1 TABLET UNDER THE TONGUE AT ONSET OF CHEST PAIN EVERY 5 MINTUES UP TO 3 TIMES AS NEEDED 08/03/21   Eileen Stanford, PA-C  polyethylene glycol (MIRALAX / GLYCOLAX) 17 g packet Take 17 g by mouth daily as needed for mild constipation. 04/21/22   Setzer, Edman Circle, PA-C  Polyvinyl Alcohol-Povidone (REFRESH OP) Place 1 drop into both eyes daily as needed (dry eye).    [provider]    Allergies:   Allergies  Allergen Reactions   Simvastatin Other (See Comments)    MUSCLE ACHES REAL BAD   Atorvastatin     Other reaction(s): stiffness, decreased energy   Lisinopril Cough   Rosuvastatin     Other reaction(s): weakness    Social History:  reports that he quit smoking about 44 years ago. His smoking use included cigarettes. He has never used smokeless tobacco. He reports that he does not currently use alcohol. He reports that he does not use drugs.  Family History: Family History  Problem Relation Age of Onset   CAD Neg Hx     Physical Exam: Vitals:   07/02/22 0015 07/02/22 0030 07/02/22 0100 07/02/22 0204  BP: 133/70 135/63 132/76 (!) 141/100   Pulse:  69 (!) 53 (!) 109  Resp: 18 (!) '24 18 20  '$ Temp: 98 F (36.7 C)   97.8 F (36.6 C)  TempSrc:    Oral  SpO2: 93% 95% 93% 100%    General:  Alert, mild/moderate dementia, well developed and nourished, no acute distress Eyes: PERRLA, pink conjunctiva, no scleral icterus ENT: Moist oral mucosa, neck supple, no thyromegaly Lungs: clear to ascultation, no wheeze, no crackles, no use of accessory muscles Cardiovascular: regular rate and rhythm, no regurgitation, no gallops, no murmurs. No carotid bruits, no JVD Abdomen: soft, positive BS, non-tender, non-distended, no organomegaly, not an acute abdomen GU: not examined Neuro: CN II - XII grossly intact, sensation intact Musculoskeletal: strength 5/5 left extremity, right hip pain, no clubbing, cyanosis or edema Skin: no rash, no subcutaneous crepitation, no decubitus Psych: appropriate patient   Labs on Admission:  Recent Labs    07/01/22 1633  NA 140  K 3.9  CL 103  CO2 28  GLUCOSE 113*  BUN 38*  CREATININE 1.87*  CALCIUM 8.9   Recent Labs    07/01/22 1633  WBC 10.0  NEUTROABS 8.4*  HGB 11.7*  HCT 35.9*  MCV 95.7  PLT 255    Radiological Exams on Admission: DG FEMUR PORT, MIN 2 VIEWS RIGHT  Result Date: 07/01/2022 CLINICAL DATA:  Proximal femur fracture, preop. EXAM: RIGHT FEMUR PORTABLE 2 VIEW COMPARISON:  Hip radiograph earlier today that included the proximal femur. FINDINGS: The included femur is intact. No fracture of the mid or distal femur. Knee alignment is preserved. Cannot assess for joint effusion due to positioning. Vascular calcifications are seen. IMPRESSION: No fracture of the mid or distal femur. Electronically Signed   By: Keith Rake M.D.   On: 07/01/2022 21:09   DG HIP UNILAT WITH PELVIS 2-3 VIEWS RIGHT  Result Date: 07/01/2022 CLINICAL DATA:  Pain EXAM: DG HIP (WITH OR WITHOUT PELVIS) 2-3V RIGHT COMPARISON:  04/02/2022 FINDINGS: Acute fracture of the right femoral neck with mild  proximal migration and external rotation. Right hip joint remains intact without dislocation. Mild osteoarthritic changes of both hips. No evidence of pelvic fracture or diastasis. Bones are demineralized. No suspicious bone lesion is identified. IMPRESSION: Acute right femoral neck fracture. Electronically Signed   By: Davina Poke D.O.   On: 07/01/2022 16:56    Assessment/Plan Present on Admission:  Closed right hip fracture (HCC)/ fall, initial encounter -Admit to [place] -N.p.o., IV fluid hydration -Pain meds as needed -Orthopedic surgeon consulted and aware -Patient on Plavix and aspirin, last dose taken 07/01/2022 AM.  Aspirin and Plavix on hold   History of CVA -Aspirin and Plavix on hold.  Statin intolerant   Coronary artery disease -Aspirin, Plavix on hold.  Lopressor and Cozaar resumed   Hyperlipidemia -Statin intolerant   Primary hypertension -Stable, resume Cozaar resumed   Stage 3b chronic kidney disease ([place]) -Stable at baseline.  Avoid nephrotoxic medications    PVD (peripheral vascular disease) ([place])   Marin Milley 07/02/2022, 2:23 AM

## 2022-07-02 NOTE — ED Notes (Signed)
Wallene Huh, daughter called [phone] called and updated

## 2022-07-02 NOTE — Progress Notes (Signed)
Patient with R displaced femoral neck fracture transferred from [facility] overnight. Possible OR today pending OR availability.

## 2022-07-02 NOTE — Consult Note (Signed)
ORTHOPAEDIC CONSULTATION  REQUESTING PHYSICIAN: Mendel Corning, MD  Chief Complaint: Fall  HPI: Adam Arellano is a 89 y.o. male with history of CAD, GERD, hypertension, peripheral vascular disease, prostate cancer status post radical prostatectomy, mitral valve implantation, dementia, CVA in October on Plavix and aspirin who was brought to the [place] emergency department on 07/01/2022 after a fall.  History obtained by patient and daughter in the room.  Patient was reaching towards a door when he pulled the door he lost control and fell and landed on his right hip.  He had significant pain and was unable to stand.  He was brought to the emergency department found to have a right femoral neck fracture.  Today right hip pain is well-controlled at rest with p.o. and IV pain medication, worse with movement.  Denies paresthesias.  Denies chest pain, shortness of breath, nausea, or vomiting.  Patient currently on Plavix last dose was morning of 07/01/2022.  Per daughter, he was planning to have his last dose of Plavix be today and then he would be transitioning only to aspirin moving forward.   Past Medical History:  Diagnosis Date   Asthma    a. Childhood.   Coronary artery disease    a. NSTEMI s/p DES to dRCA 2007. b. Angina 07/2009: s/p DESx2 to mid RCA for ISR and prox RCA, EF 65%. c. Stress test 05/2013: low risk, no evidence of ischemia, EF 61%.   CVA (cerebral vascular accident) ([place])    Dyslipidemia    GERD (gastroesophageal reflux disease)    History of kidney stones    Hypertension    Peripheral vascular disease (HCC)    Prostate cancer ([place])    a. s/p radical prostatectomy.   PVC's (premature ventricular contractions)    S/P mitral valve clip implantation 08/19/2020   s/p MitraClip implantation with a XTW by Dr. Burt Knack    Transient global amnesia    a. Adm 2009: imaging nonacute, had resolved.   Past Surgical History:  Procedure Laterality Date   CARDIAC  CATHETERIZATION     COLONOSCOPY     CORONARY STENT INTERVENTION N/A 08/06/2020   Procedure: CORONARY STENT INTERVENTION;  Surgeon: Sherren Mocha, MD;  Location: [place] [facility];  Service: Cardiovascular;  Laterality: N/A;   EYE SURGERY Bilateral    cataracts removed   MASTOIDECTOMY     L ear   MITRAL VALVE REPAIR     MITRAL VALVE REPAIR N/A 08/19/2020   Procedure: MITRAL VALVE REPAIR;  Surgeon: Sherren Mocha, MD;  Location: [place] [facility];  Service: Cardiovascular;  Laterality: N/A;   PROSTATECTOMY     RIGHT/LEFT HEART CATH AND CORONARY ANGIOGRAPHY N/A 08/06/2020   Procedure: RIGHT/LEFT HEART CATH AND CORONARY ANGIOGRAPHY;  Surgeon: Sherren Mocha, MD;  Location: [facility];  Service: Cardiovascular;  Laterality: N/A;   TEE WITHOUT CARDIOVERSION N/A 08/06/2020   Procedure: TRANSESOPHAGEAL ECHOCARDIOGRAM (TEE);  Surgeon: Sanda Klein, MD;  Location: [facility];  Service: Cardiovascular;  Laterality: N/A;   TEE WITHOUT CARDIOVERSION  08/19/2020   TEE WITHOUT CARDIOVERSION N/A 08/19/2020   Procedure: TRANSESOPHAGEAL ECHOCARDIOGRAM (TEE);  Surgeon: Sherren Mocha, MD;  Location: [place] [facility];  Service: Cardiovascular;  Laterality: N/A;   Social History   Socioeconomic History   Marital status: Married    Spouse name: Not on file   Number of children: Not on file   Years of education: Not on file   Highest education level: Not on file  Occupational History   Not on file  Tobacco Use   Smoking status: Former    Types: Cigarettes    Quit date: 1980    Years since quitting: 44.1   Smokeless tobacco: Never   Tobacco comments:    Smoked for 15 years  Vaping Use   Vaping Use: Never used  Substance and Sexual Activity   Alcohol use: Not Currently    Comment: rare   Drug use: No   Sexual activity: Not on file  Other Topics Concern   Not on file  Social History Narrative   Not on file   Social Determinants of Health   Financial Resource Strain: Not on  file  Food Insecurity: No Food Insecurity (07/02/2022)   Hunger Vital Sign    Worried About Running Out of Food in the Last Year: Never true    Ran Out of Food in the Last Year: Never true  Transportation Needs: No Transportation Needs (07/02/2022)   PRAPARE - Hydrologist (Medical): No    Lack of Transportation (Non-Medical): No  Physical Activity: Not on file  Stress: Not on file  Social Connections: Not on file   Family History  Problem Relation Age of Onset   CAD Neg Hx    Allergies  Allergen Reactions   Simvastatin Other (See Comments)    MUSCLE ACHES REAL BAD   Atorvastatin     Other reaction(s): stiffness, decreased energy   Lisinopril Cough   Rosuvastatin     Other reaction(s): weakness   Prior to Admission medications   Medication Sig Start Date End Date Taking? Authorizing Provider  acetaminophen (TYLENOL) 325 MG tablet Take 1-2 tablets (325-650 mg total) by mouth every 4 (four) hours as needed for mild pain. 04/21/22   Setzer, Edman Circle, PA-C  aspirin EC 81 MG tablet Take 81 mg by mouth daily. Swallow whole.    [provider]  ciprofloxacin-dexamethasone (CIPRODEX) OTIC suspension Place 4 drops into the left ear 2 (two) times daily as needed (ear infection).    [provider]  clopidogrel (PLAVIX) 75 MG tablet Take 1 tablet (75 mg total) by mouth daily. 05/19/22   Jerline Pain, MD  docusate sodium (COLACE) 100 MG capsule Take 1 capsule (100 mg total) by mouth 2 (two) times daily. 04/21/22   Setzer, Edman Circle, PA-C  FIBER PO Take 1 capsule by mouth daily.    [provider]  furosemide (LASIX) 20 MG tablet Take 1 tablet (20 mg total) by mouth daily. 05/19/22   Jerline Pain, MD  gabapentin (NEURONTIN) 100 MG capsule Take 1 capsule (100 mg total) by mouth at bedtime. 04/21/22   Setzer, Edman Circle, PA-C  losartan (COZAAR) 25 MG tablet TAKE 1 TABLET DAILY 01/16/22   Jerline Pain, MD  Menthol-Methyl Salicylate (MUSCLE  RUB) 10-15 % CREA Apply 1 Application topically 2 (two) times daily as needed for muscle pain. 04/21/22   Setzer, Edman Circle, PA-C  metoprolol tartrate (LOPRESSOR) 25 MG tablet Take 25 mg by mouth 2 (two) times daily. 10/24/13   Jerline Pain, MD  Multiple Vitamin (MULTIVITAMIN WITH MINERALS) TABS tablet Take 1 tablet by mouth daily.    [provider]  nitroGLYCERIN (NITROSTAT) 0.4 MG SL tablet PLACE 1 TABLET UNDER THE TONGUE AT ONSET OF CHEST PAIN EVERY 5 MINTUES UP TO 3 TIMES AS NEEDED 08/03/21   Eileen Stanford, PA-C  polyethylene glycol (MIRALAX / GLYCOLAX) 17 g packet Take 17  g by mouth daily as needed for mild constipation. 04/21/22   Setzer, Edman Circle, PA-C  Polyvinyl Alcohol-Povidone (REFRESH OP) Place 1 drop into both eyes daily as needed (dry eye).    [provider]   DG FEMUR PORT, MIN 2 VIEWS RIGHT  Result Date: 07/01/2022 CLINICAL DATA:  Proximal femur fracture, preop. EXAM: RIGHT FEMUR PORTABLE 2 VIEW COMPARISON:  Hip radiograph earlier today that included the proximal femur. FINDINGS: The included femur is intact. No fracture of the mid or distal femur. Knee alignment is preserved. Cannot assess for joint effusion due to positioning. Vascular calcifications are seen. IMPRESSION: No fracture of the mid or distal femur. Electronically Signed   By: Keith Rake M.D.   On: 07/01/2022 21:09   DG HIP UNILAT WITH PELVIS 2-3 VIEWS RIGHT  Result Date: 07/01/2022 CLINICAL DATA:  Pain EXAM: DG HIP (WITH OR WITHOUT PELVIS) 2-3V RIGHT COMPARISON:  04/02/2022 FINDINGS: Acute fracture of the right femoral neck with mild proximal migration and external rotation. Right hip joint remains intact without dislocation. Mild osteoarthritic changes of both hips. No evidence of pelvic fracture or diastasis. Bones are demineralized. No suspicious bone lesion is identified. IMPRESSION: Acute right femoral neck fracture. Electronically Signed   By: Davina Poke D.O.   On: 07/01/2022 16:56    Family History Reviewed and non-contributory, no pertinent history of problems with bleeding or anesthesia      Review of Systems 14 system ROS conducted and negative except for that noted in HPI   OBJECTIVE  Vitals:Patient Vitals for the past 8 hrs:  BP Temp Temp src Pulse Resp SpO2  07/02/22 0459 (!) 135/110 -- -- 85 18 100 %  07/02/22 0204 (!) 141/100 97.8 F (36.6 C) Oral (!) 109 20 100 %  07/02/22 0100 132/76 -- -- (!) 53 18 93 %   General: Alert, no acute distress Cardiovascular: Warm extremities noted Respiratory: No cyanosis, no use of accessory musculature GI: No organomegaly, abdomen is soft and non-tender Skin: No lesions in the area of chief complaint other than those listed below in MSK exam.  Neurologic: Sensation intact distally save for the below mentioned MSK exam Psychiatric: Patient pleasant with normal mood and affect, though sometimes confused on answering questions. Lymphatic: No swelling obvious and reported other than the area involved in the exam below  Extremities  RLE: No TTP to right hip. No ecchymosis or bruising. Dorsiflexion and plantarflexion intact. Sensation intact diffusely to right foot. + DP pulse. Right leg shortened and externally rotated.      Test Results Imaging X-rays of right hip show acute right femoral neck fracture  Labs cbc Recent Labs    07/01/22 1633 07/02/22 0502  WBC 10.0 14.0*  HGB 11.7* 12.7*  HCT 35.9* 39.9  PLT 255 241    Labs inflam No results for input(s): "CRP" in the last 72 hours.  Invalid input(s): "ESR"  Labs coag Recent Labs    07/01/22 1633 07/02/22 0502  INR 1.0 1.2    Recent Labs    07/01/22 1633 07/02/22 0502  NA 140 139  K 3.9 3.6  CL 103 104  CO2 28 26  GLUCOSE 113* 121*  BUN 38* 33*  CREATININE 1.87* 1.77*  CALCIUM 8.9 8.6*     ASSESSMENT AND PLAN: 89 y.o. male with the following: right femoral neck fracture  Discussed the nature of the injury as well as the care with  the patient and daughter.  Discussed options and non-operative versus operative measures.  Nonoperative measures are not well tolerated as patient's on bedrest for extended periods of time tend to develop secondary issues such as pneumonia, urinary tract infections, bedsores and delirium.  Based on this our recommendation is for operative measures.  Understanding this the patient/family elected to proceed with operative measures.  The risks and benefits of  surgical intervention including infection, bleeding, nerve injury, periprosthetic fracture, the need for revision surgery, leg length discrepancy, gait change, blood clots, cardiopulmonary complications, morbidity, mortality, among others, and they were willing to proceed.    - Plan: Originally planned for operative fixation today, however we have been told that only emergent cases are being performed today. Thus plan for Right hip Hemiarthroplasty with Dr. Zachery Dakins tomorrow - NPO at midnight  - continue to hold Plavix and ASA - Weight Bearing Status/Activity: will ammend WB status postop, bedrest for now - PT/OT post op - VTE Prophylaxis: SCDs for now - Pain control: PRN pain medications - Dispo: Likely to require Rehab or SNF placement upon discharge.    07/02/2022 8:38 AM

## 2022-07-02 NOTE — ED Notes (Signed)
Report to Angelica, RN at [facility]

## 2022-07-02 NOTE — H&P (View-Only) (Signed)
ORTHOPAEDIC CONSULTATION  REQUESTING PHYSICIAN: Mendel Corning, MD  Chief Complaint: Fall  HPI: Adam Arellano is a 89 y.o. male with history of CAD, GERD, hypertension, peripheral vascular disease, prostate cancer status post radical prostatectomy, mitral valve implantation, dementia, CVA in October on Plavix and aspirin who was brought to the [place] emergency department on 07/01/2022 after a fall.  History obtained by patient and daughter in the room.  Patient was reaching towards a door when he pulled the door he lost control and fell and landed on his right hip.  He had significant pain and was unable to stand.  He was brought to the emergency department found to have a right femoral neck fracture.  Today right hip pain is well-controlled at rest with p.o. and IV pain medication, worse with movement.  Denies paresthesias.  Denies chest pain, shortness of breath, nausea, or vomiting.  Patient currently on Plavix last dose was morning of 07/01/2022.  Per daughter, he was planning to have his last dose of Plavix be today and then he would be transitioning only to aspirin moving forward.   Past Medical History:  Diagnosis Date   Asthma    a. Childhood.   Coronary artery disease    a. NSTEMI s/p DES to dRCA 2007. b. Angina 07/2009: s/p DESx2 to mid RCA for ISR and prox RCA, EF 65%. c. Stress test 05/2013: low risk, no evidence of ischemia, EF 61%.   CVA (cerebral vascular accident) ([place])    Dyslipidemia    GERD (gastroesophageal reflux disease)    History of kidney stones    Hypertension    Peripheral vascular disease (HCC)    Prostate cancer ([place])    a. s/p radical prostatectomy.   PVC's (premature ventricular contractions)    S/P mitral valve clip implantation 08/19/2020   s/p MitraClip implantation with a XTW by Dr. Burt Knack    Transient global amnesia    a. Adm 2009: imaging nonacute, had resolved.   Past Surgical History:  Procedure Laterality Date   CARDIAC  CATHETERIZATION     COLONOSCOPY     CORONARY STENT INTERVENTION N/A 08/06/2020   Procedure: CORONARY STENT INTERVENTION;  Surgeon: Sherren Mocha, MD;  Location: [place] [facility];  Service: Cardiovascular;  Laterality: N/A;   EYE SURGERY Bilateral    cataracts removed   MASTOIDECTOMY     L ear   MITRAL VALVE REPAIR     MITRAL VALVE REPAIR N/A 08/19/2020   Procedure: MITRAL VALVE REPAIR;  Surgeon: Sherren Mocha, MD;  Location: [place] [facility];  Service: Cardiovascular;  Laterality: N/A;   PROSTATECTOMY     RIGHT/LEFT HEART CATH AND CORONARY ANGIOGRAPHY N/A 08/06/2020   Procedure: RIGHT/LEFT HEART CATH AND CORONARY ANGIOGRAPHY;  Surgeon: Sherren Mocha, MD;  Location: [place] [facility];  Service: Cardiovascular;  Laterality: N/A;   TEE WITHOUT CARDIOVERSION N/A 08/06/2020   Procedure: TRANSESOPHAGEAL ECHOCARDIOGRAM (TEE);  Surgeon: Sanda Klein, MD;  Location: [facility];  Service: Cardiovascular;  Laterality: N/A;   TEE WITHOUT CARDIOVERSION  08/19/2020   TEE WITHOUT CARDIOVERSION N/A 08/19/2020   Procedure: TRANSESOPHAGEAL ECHOCARDIOGRAM (TEE);  Surgeon: Sherren Mocha, MD;  Location: [place] [facility];  Service: Cardiovascular;  Laterality: N/A;   Social History   Socioeconomic History   Marital status: Married    Spouse name: Not on file   Number of children: Not on file   Years of education: Not on file   Highest education level: Not on file  Occupational History   Not on file  Tobacco Use   Smoking status: Former    Types: Cigarettes    Quit date: 1980    Years since quitting: 44.1   Smokeless tobacco: Never   Tobacco comments:    Smoked for 15 years  Vaping Use   Vaping Use: Never used  Substance and Sexual Activity   Alcohol use: Not Currently    Comment: rare   Drug use: No   Sexual activity: Not on file  Other Topics Concern   Not on file  Social History Narrative   Not on file   Social Determinants of Health   Financial Resource Strain: Not on  file  Food Insecurity: No Food Insecurity (07/02/2022)   Hunger Vital Sign    Worried About Running Out of Food in the Last Year: Never true    Ran Out of Food in the Last Year: Never true  Transportation Needs: No Transportation Needs (07/02/2022)   PRAPARE - Hydrologist (Medical): No    Lack of Transportation (Non-Medical): No  Physical Activity: Not on file  Stress: Not on file  Social Connections: Not on file   Family History  Problem Relation Age of Onset   CAD Neg Hx    Allergies  Allergen Reactions   Simvastatin Other (See Comments)    MUSCLE ACHES REAL BAD   Atorvastatin     Other reaction(s): stiffness, decreased energy   Lisinopril Cough   Rosuvastatin     Other reaction(s): weakness   Prior to Admission medications   Medication Sig Start Date End Date Taking? Authorizing Provider  acetaminophen (TYLENOL) 325 MG tablet Take 1-2 tablets (325-650 mg total) by mouth every 4 (four) hours as needed for mild pain. 04/21/22   Setzer, Edman Circle, PA-C  aspirin EC 81 MG tablet Take 81 mg by mouth daily. Swallow whole.    [provider]  ciprofloxacin-dexamethasone (CIPRODEX) OTIC suspension Place 4 drops into the left ear 2 (two) times daily as needed (ear infection).    [provider]  clopidogrel (PLAVIX) 75 MG tablet Take 1 tablet (75 mg total) by mouth daily. 05/19/22   Jerline Pain, MD  docusate sodium (COLACE) 100 MG capsule Take 1 capsule (100 mg total) by mouth 2 (two) times daily. 04/21/22   Setzer, Edman Circle, PA-C  FIBER PO Take 1 capsule by mouth daily.    [provider]  furosemide (LASIX) 20 MG tablet Take 1 tablet (20 mg total) by mouth daily. 05/19/22   Jerline Pain, MD  gabapentin (NEURONTIN) 100 MG capsule Take 1 capsule (100 mg total) by mouth at bedtime. 04/21/22   Setzer, Edman Circle, PA-C  losartan (COZAAR) 25 MG tablet TAKE 1 TABLET DAILY 01/16/22   Jerline Pain, MD  Menthol-Methyl Salicylate (MUSCLE  RUB) 10-15 % CREA Apply 1 Application topically 2 (two) times daily as needed for muscle pain. 04/21/22   Setzer, Edman Circle, PA-C  metoprolol tartrate (LOPRESSOR) 25 MG tablet Take 25 mg by mouth 2 (two) times daily. 10/24/13   Jerline Pain, MD  Multiple Vitamin (MULTIVITAMIN WITH MINERALS) TABS tablet Take 1 tablet by mouth daily.    [provider]  nitroGLYCERIN (NITROSTAT) 0.4 MG SL tablet PLACE 1 TABLET UNDER THE TONGUE AT ONSET OF CHEST PAIN EVERY 5 MINTUES UP TO 3 TIMES AS NEEDED 08/03/21   Eileen Stanford, PA-C  polyethylene glycol (MIRALAX / GLYCOLAX) 17 g packet Take 17  g by mouth daily as needed for mild constipation. 04/21/22   Setzer, Edman Circle, PA-C  Polyvinyl Alcohol-Povidone (REFRESH OP) Place 1 drop into both eyes daily as needed (dry eye).    [provider]   DG FEMUR PORT, MIN 2 VIEWS RIGHT  Result Date: 07/01/2022 CLINICAL DATA:  Proximal femur fracture, preop. EXAM: RIGHT FEMUR PORTABLE 2 VIEW COMPARISON:  Hip radiograph earlier today that included the proximal femur. FINDINGS: The included femur is intact. No fracture of the mid or distal femur. Knee alignment is preserved. Cannot assess for joint effusion due to positioning. Vascular calcifications are seen. IMPRESSION: No fracture of the mid or distal femur. Electronically Signed   By: Keith Rake M.D.   On: 07/01/2022 21:09   DG HIP UNILAT WITH PELVIS 2-3 VIEWS RIGHT  Result Date: 07/01/2022 CLINICAL DATA:  Pain EXAM: DG HIP (WITH OR WITHOUT PELVIS) 2-3V RIGHT COMPARISON:  04/02/2022 FINDINGS: Acute fracture of the right femoral neck with mild proximal migration and external rotation. Right hip joint remains intact without dislocation. Mild osteoarthritic changes of both hips. No evidence of pelvic fracture or diastasis. Bones are demineralized. No suspicious bone lesion is identified. IMPRESSION: Acute right femoral neck fracture. Electronically Signed   By: Davina Poke D.O.   On: 07/01/2022 16:56    Family History Reviewed and non-contributory, no pertinent history of problems with bleeding or anesthesia      Review of Systems 14 system ROS conducted and negative except for that noted in HPI   OBJECTIVE  Vitals:Patient Vitals for the past 8 hrs:  BP Temp Temp src Pulse Resp SpO2  07/02/22 0459 (!) 135/110 -- -- 85 18 100 %  07/02/22 0204 (!) 141/100 97.8 F (36.6 C) Oral (!) 109 20 100 %  07/02/22 0100 132/76 -- -- (!) 53 18 93 %   General: Alert, no acute distress Cardiovascular: Warm extremities noted Respiratory: No cyanosis, no use of accessory musculature GI: No organomegaly, abdomen is soft and non-tender Skin: No lesions in the area of chief complaint other than those listed below in MSK exam.  Neurologic: Sensation intact distally save for the below mentioned MSK exam Psychiatric: Patient pleasant with normal mood and affect, though sometimes confused on answering questions. Lymphatic: No swelling obvious and reported other than the area involved in the exam below  Extremities  RLE: No TTP to right hip. No ecchymosis or bruising. Dorsiflexion and plantarflexion intact. Sensation intact diffusely to right foot. + DP pulse. Right leg shortened and externally rotated.      Test Results Imaging X-rays of right hip show acute right femoral neck fracture  Labs cbc Recent Labs    07/01/22 1633 07/02/22 0502  WBC 10.0 14.0*  HGB 11.7* 12.7*  HCT 35.9* 39.9  PLT 255 241    Labs inflam No results for input(s): "CRP" in the last 72 hours.  Invalid input(s): "ESR"  Labs coag Recent Labs    07/01/22 1633 07/02/22 0502  INR 1.0 1.2    Recent Labs    07/01/22 1633 07/02/22 0502  NA 140 139  K 3.9 3.6  CL 103 104  CO2 28 26  GLUCOSE 113* 121*  BUN 38* 33*  CREATININE 1.87* 1.77*  CALCIUM 8.9 8.6*     ASSESSMENT AND PLAN: 89 y.o. male with the following: right femoral neck fracture  Discussed the nature of the injury as well as the care with  the patient and daughter.  Discussed options and non-operative versus operative measures.  Nonoperative measures are not well tolerated as patient's on bedrest for extended periods of time tend to develop secondary issues such as pneumonia, urinary tract infections, bedsores and delirium.  Based on this our recommendation is for operative measures.  Understanding this the patient/family elected to proceed with operative measures.  The risks and benefits of  surgical intervention including infection, bleeding, nerve injury, periprosthetic fracture, the need for revision surgery, leg length discrepancy, gait change, blood clots, cardiopulmonary complications, morbidity, mortality, among others, and they were willing to proceed.    - Plan: Originally planned for operative fixation today, however we have been told that only emergent cases are being performed today. Thus plan for Right hip Hemiarthroplasty with Dr. Zachery Dakins tomorrow - NPO at midnight  - continue to hold Plavix and ASA - Weight Bearing Status/Activity: will ammend WB status postop, bedrest for now - PT/OT post op - VTE Prophylaxis: SCDs for now - Pain control: PRN pain medications - Dispo: Likely to require Rehab or SNF placement upon discharge.    07/02/2022 8:38 AM

## 2022-07-02 NOTE — Progress Notes (Signed)
[facility]                                                                              Adam Arellano, is a 89 y.o. male, DOB - 30-Sep-1931, JTT:[id number] Admit date - 07/01/2022    Outpatient Primary MD for the patient is [name], [name], MD  LOS - 0  days  Chief Complaint  Patient presents with   Fall       Brief summary   Patient is a 89 year old male with a history of CVA, prostate CA status post radical cystectomy, mitral valve clip, HTN, PVD, CAD, dyslipidemia, cognitive issues presented with mechanical fall and right hip pain.  Patient lives at home with his daughter and fell on his right hip while eating a boutique a day before the admission.  He was unable to stand up and was in pain.  No loss of consciousness, no chest pain, palpitations, dizziness or lightheadedness. Hip/pelvic x-ray showed acute right femoral neck fracture Patient was admitted for further workup.  Assessment & Plan    Principal Problem: Mechanical fall with closed right hip fracture ([place]) -Orthopedics consulted, plan for the OR tomorrow 1/29 -Continue heart healthy diet today, n.p.o. after midnight -Continue pain control, bowel regimen -Pain control, DVT prophylaxis per orthopedics. -Currently at home with his daughter. If requires rehab, patient's daughter requested CIR after surgery -Continue to hold aspirin and Plavix  Active Problems:    Primary hypertension -BP currently stable, continue Lopressor    PVD (peripheral vascular disease) (HCC) -Continue to hold aspirin and Plavix, will resume after surgery    Coronary artery disease -Currently no chest pain or shortness of breath -Continue beta-blocker.  Hold losartan. -Hold aspirin and Plavix until surgery   stage 3b chronic kidney disease (HCC) -Baseline creatinine ~1.8.  Creatinine currently at baseline -BP stable, hold off on Lasix, losartan -Will resume after surgery.    H/O: CVA (cerebrovascular  accident) -Currently no acute FND's, -Aspirin, Plavix on hold.    Asthma, chronic -Currently stable, no wheezing.     Fall from ground level -PT OT evaluation after the surgery -If patient requires rehab, patient's daughter requested CIR  Estimated body mass index is 20.97 kg/m as calculated from the following:   Height as of 06/30/22: '5\' 9"'$  (1.753 m).   Weight as of 06/30/22: 64.4 kg.  Code Status: DNR DVT Prophylaxis:  enoxaparin (LOVENOX) injection 30 mg Start: 07/02/22 1000 SCDs Start: 07/02/22 0225   Level of Care: Level of care: Telemetry Family Communication: Updated patient's daughter at the bedside Disposition Plan:      Remains inpatient appropriate: Surgery pending   Procedures:  None  Consultants:   Orthopedics  Antimicrobials: None    Medications  docusate sodium  100 mg Oral BID   enoxaparin (LOVENOX) injection  30 mg Subcutaneous Q24H   gabapentin  100 mg Oral QHS   metoprolol tartrate  25 mg Oral BID      Subjective:   Adam Arellano was seen and examined today.  Right hip pain is controlled, no fevers.  Daughter at the bedside.  Patient denies dizziness, chest pain, shortness of breath, abdominal  pain, N/V.    Objective:   Vitals:   07/02/22 0100 07/02/22 0204 07/02/22 0459 07/02/22 0922  BP: 132/76 (!) 141/100 (!) 135/110 125/89  Pulse: (!) 53 (!) 109 85 87  Resp: '18 20 18 14  '$ Temp:  97.8 F (36.6 C)  98.1 F (36.7 C)  TempSrc:  Oral    SpO2: 93% 100% 100% 100%    Intake/Output Summary (Last 24 hours) at 07/02/2022 1146 Last data filed at 07/02/2022 0917 Gross per 24 hour  Intake 393.75 ml  Output 400 ml  Net -6.25 ml     Wt Readings from Last 3 Encounters:  06/30/22 64.4 kg  06/01/22 65.3 kg  05/19/22 67.6 kg     Exam General: Alert and oriented x 3, NAD Cardiovascular: S1 S2 auscultated,  RRR Respiratory: Clear to auscultation bilaterally, no wheezing, rales or rhonchi Gastrointestinal: Soft, nontender,  nondistended, + bowel sounds Ext: no pedal edema bilaterally Neuro: no new FND's Psych: Normal affect     Data Reviewed:  I have personally reviewed following labs    CBC Lab Results  Component Value Date   WBC 14.0 (H) 07/02/2022   RBC 4.08 (L) 07/02/2022   HGB 12.7 (L) 07/02/2022   HCT 39.9 07/02/2022   MCV 97.8 07/02/2022   MCH 31.1 07/02/2022   PLT 241 07/02/2022   MCHC 31.8 07/02/2022   RDW 12.8 07/02/2022   LYMPHSABS 0.9 07/01/2022   MONOABS 0.6 07/01/2022   EOSABS 0.1 07/01/2022   BASOSABS 0.0 16/03/9603     Last metabolic panel Lab Results  Component Value Date   NA 139 07/02/2022   K 3.6 07/02/2022   CL 104 07/02/2022   CO2 26 07/02/2022   BUN 33 (H) 07/02/2022   CREATININE 1.77 (H) 07/02/2022   GLUCOSE 121 (H) 07/02/2022   GFRNONAA 36 (L) 07/02/2022   GFRAA 45 (L) 07/28/2020   CALCIUM 8.6 (L) 07/02/2022   PROT 5.7 (L) 04/07/2022   ALBUMIN 3.0 (L) 04/07/2022   BILITOT 0.7 04/07/2022   ALKPHOS 55 04/07/2022   AST 19 04/07/2022   ALT 16 04/07/2022   ANIONGAP 9 07/02/2022    CBG (last 3)  No results for input(s): "GLUCAP" in the last 72 hours.    Coagulation Profile: Recent Labs  Lab 07/01/22 1633 07/02/22 0502  INR 1.0 1.2     Radiology Studies: I have personally reviewed the imaging studies  DG FEMUR PORT, MIN 2 VIEWS RIGHT  Result Date: 07/01/2022 CLINICAL DATA:  Proximal femur fracture, preop. EXAM: RIGHT FEMUR PORTABLE 2 VIEW COMPARISON:  Hip radiograph earlier today that included the proximal femur. FINDINGS: The included femur is intact. No fracture of the mid or distal femur. Knee alignment is preserved. Cannot assess for joint effusion due to positioning. Vascular calcifications are seen. IMPRESSION: No fracture of the mid or distal femur. Electronically Signed   By: Keith Rake M.D.   On: 07/01/2022 21:09   DG HIP UNILAT WITH PELVIS 2-3 VIEWS RIGHT  Result Date: 07/01/2022 CLINICAL DATA:  Pain EXAM: DG HIP (WITH OR WITHOUT  PELVIS) 2-3V RIGHT COMPARISON:  04/02/2022 FINDINGS: Acute fracture of the right femoral neck with mild proximal migration and external rotation. Right hip joint remains intact without dislocation. Mild osteoarthritic changes of both hips. No evidence of pelvic fracture or diastasis. Bones are demineralized. No suspicious bone lesion is identified. IMPRESSION: Acute right femoral neck fracture. Electronically Signed   By: Davina Poke D.O.   On: 07/01/2022 16:56  Estill Cotta M.D. [facility] 07/02/2022, 11:46 AM  Available via Epic secure chat 7am-7pm After 7 pm, please refer to night coverage provider listed on amion.

## 2022-07-03 ENCOUNTER — Other Ambulatory Visit: Payer: Self-pay

## 2022-07-03 ENCOUNTER — Encounter (HOSPITAL_COMMUNITY)
Admission: EM | Disposition: A | Discharge status: Home / Self Care | Payer: Self-pay | Source: Home / Self Care | Attending: Family Medicine

## 2022-07-03 ENCOUNTER — Inpatient Hospital Stay (HOSPITAL_COMMUNITY): Payer: Medicare Other | Admitting: Anesthesiology

## 2022-07-03 ENCOUNTER — Inpatient Hospital Stay (HOSPITAL_COMMUNITY): Payer: Medicare Other

## 2022-07-03 DIAGNOSIS — S72001A Fracture of unspecified part of neck of right femur, initial encounter for closed fracture: Secondary | ICD-10-CM | POA: Diagnosis not present

## 2022-07-03 DIAGNOSIS — I739 Peripheral vascular disease, unspecified: Secondary | ICD-10-CM

## 2022-07-03 DIAGNOSIS — Z8673 Personal history of transient ischemic attack (TIA), and cerebral infarction without residual deficits: Secondary | ICD-10-CM | POA: Diagnosis not present

## 2022-07-03 DIAGNOSIS — Z87891 Personal history of nicotine dependence: Secondary | ICD-10-CM

## 2022-07-03 DIAGNOSIS — S72041A Displaced fracture of base of neck of right femur, initial encounter for closed fracture: Secondary | ICD-10-CM

## 2022-07-03 DIAGNOSIS — D649 Anemia, unspecified: Secondary | ICD-10-CM

## 2022-07-03 DIAGNOSIS — W1830XA Fall on same level, unspecified, initial encounter: Secondary | ICD-10-CM | POA: Diagnosis not present

## 2022-07-03 DIAGNOSIS — J45909 Unspecified asthma, uncomplicated: Secondary | ICD-10-CM

## 2022-07-03 DIAGNOSIS — I2510 Atherosclerotic heart disease of native coronary artery without angina pectoris: Secondary | ICD-10-CM

## 2022-07-03 DIAGNOSIS — I10 Essential (primary) hypertension: Secondary | ICD-10-CM

## 2022-07-03 DIAGNOSIS — I252 Old myocardial infarction: Secondary | ICD-10-CM

## 2022-07-03 HISTORY — PX: HIP ARTHROPLASTY: SHX981

## 2022-07-03 LAB — CBC
HCT: 34.2 % — ABNORMAL LOW (ref 39.0–52.0)
Hemoglobin: 11 g/dL — ABNORMAL LOW (ref 13.0–17.0)
MCH: 32 pg (ref 26.0–34.0)
MCHC: 32.2 g/dL (ref 30.0–36.0)
MCV: 99.4 fL (ref 80.0–100.0)
Platelets: 207 10*3/uL (ref 150–400)
RBC: 3.44 MIL/uL — ABNORMAL LOW (ref 4.22–5.81)
RDW: 13.1 % (ref 11.5–15.5)
WBC: 10.8 10*3/uL — ABNORMAL HIGH (ref 4.0–10.5)
nRBC: 0 % (ref 0.0–0.2)

## 2022-07-03 LAB — SURGICAL PCR SCREEN
MRSA, PCR: NEGATIVE
Staphylococcus aureus: NEGATIVE

## 2022-07-03 LAB — BASIC METABOLIC PANEL
Anion gap: 9 (ref 5–15)
BUN: 27 mg/dL — ABNORMAL HIGH (ref 8–23)
CO2: 24 mmol/L (ref 22–32)
Calcium: 8.1 mg/dL — ABNORMAL LOW (ref 8.9–10.3)
Chloride: 103 mmol/L (ref 98–111)
Creatinine, Ser: 1.64 mg/dL — ABNORMAL HIGH (ref 0.61–1.24)
GFR, Estimated: 40 mL/min — ABNORMAL LOW (ref >60)
Glucose, Bld: 99 mg/dL (ref 70–99)
Potassium: 3.4 mmol/L — ABNORMAL LOW (ref 3.5–5.1)
Sodium: 136 mmol/L (ref 135–145)

## 2022-07-03 LAB — TYPE AND SCREEN
ABO/RH(D): O POS
Antibody Screen: NEGATIVE

## 2022-07-03 SURGERY — HEMIARTHROPLASTY, HIP, DIRECT ANTERIOR APPROACH, FOR FRACTURE
Anesthesia: General | Site: Hip | Laterality: Right

## 2022-07-03 MED ORDER — AMISULPRIDE (ANTIEMETIC) 5 MG/2ML IV SOLN: 10.0000 mg | Freq: Once | INTRAVENOUS | Status: DC | PRN

## 2022-07-03 MED ORDER — ORAL CARE MOUTH RINSE: 15.0000 mL | Freq: Once | OROMUCOSAL | Status: AC

## 2022-07-03 MED ORDER — POTASSIUM CHLORIDE 10 MEQ/100ML IV SOLN
10.0000 meq | INTRAVENOUS | Status: AC
  Administered 2022-07-03 (×3): 10 meq via INTRAVENOUS
  Filled 2022-07-03 (×3): qty 100

## 2022-07-03 MED ORDER — FENTANYL CITRATE (PF) 100 MCG/2ML IJ SOLN
INTRAMUSCULAR | Status: AC
  Filled 2022-07-03: qty 2

## 2022-07-03 MED ORDER — PROPOFOL 10 MG/ML IV BOLUS
INTRAVENOUS | Status: DC | PRN
  Administered 2022-07-03: 60 mg via INTRAVENOUS

## 2022-07-03 MED ORDER — POTASSIUM CHLORIDE CRYS ER 20 MEQ PO TBCR: 40.0000 meq | EXTENDED_RELEASE_TABLET | Freq: Once | ORAL | Status: DC

## 2022-07-03 MED ORDER — BUPIVACAINE LIPOSOME 1.3 % IJ SUSP
INTRAMUSCULAR | Status: DC | PRN
  Administered 2022-07-03: 20 mL

## 2022-07-03 MED ORDER — 0.9 % SODIUM CHLORIDE (POUR BTL) OPTIME
TOPICAL | Status: DC | PRN
  Administered 2022-07-03: 1000 mL

## 2022-07-03 MED ORDER — DEXAMETHASONE SODIUM PHOSPHATE 10 MG/ML IJ SOLN
INTRAMUSCULAR | Status: AC
  Filled 2022-07-03: qty 1

## 2022-07-03 MED ORDER — SODIUM CHLORIDE 0.9 % IR SOLN
Status: DC | PRN
  Administered 2022-07-03: 4000 mL

## 2022-07-03 MED ORDER — PHENYLEPHRINE HCL-NACL 20-0.9 MG/250ML-% IV SOLN
INTRAVENOUS | Status: AC
  Filled 2022-07-03: qty 250

## 2022-07-03 MED ORDER — FENTANYL CITRATE PF 50 MCG/ML IJ SOSY
25.0000 ug | PREFILLED_SYRINGE | INTRAMUSCULAR | Status: DC | PRN
  Administered 2022-07-03: 50 ug via INTRAVENOUS

## 2022-07-03 MED ORDER — PHENYLEPHRINE HCL-NACL 20-0.9 MG/250ML-% IV SOLN
INTRAVENOUS | Status: DC | PRN
  Administered 2022-07-03: 40 ug/min via INTRAVENOUS

## 2022-07-03 MED ORDER — WATER FOR IRRIGATION, STERILE IR SOLN
Status: DC | PRN
  Administered 2022-07-03: 2000 mL

## 2022-07-03 MED ORDER — SODIUM CHLORIDE 0.9 % IR SOLN
Status: DC | PRN
  Administered 2022-07-03: 1000 mL

## 2022-07-03 MED ORDER — SODIUM CHLORIDE (PF) 0.9 % IJ SOLN
INTRAMUSCULAR | Status: DC | PRN
  Administered 2022-07-03: 60 mL

## 2022-07-03 MED ORDER — TRANEXAMIC ACID-NACL 1000-0.7 MG/100ML-% IV SOLN
INTRAVENOUS | Status: AC
  Filled 2022-07-03: qty 100

## 2022-07-03 MED ORDER — ONDANSETRON HCL 4 MG/2ML IJ SOLN
INTRAMUSCULAR | Status: DC | PRN
  Administered 2022-07-03: 4 mg via INTRAVENOUS

## 2022-07-03 MED ORDER — ROCURONIUM BROMIDE 100 MG/10ML IV SOLN
INTRAVENOUS | Status: DC | PRN
  Administered 2022-07-03: 40 mg via INTRAVENOUS

## 2022-07-03 MED ORDER — CHLORHEXIDINE GLUCONATE 0.12 % MT SOLN
15.0000 mL | Freq: Once | OROMUCOSAL | Status: AC
  Administered 2022-07-03: 15 mL via OROMUCOSAL

## 2022-07-03 MED ORDER — ROCURONIUM BROMIDE 10 MG/ML (PF) SYRINGE
PREFILLED_SYRINGE | INTRAVENOUS | Status: AC
  Filled 2022-07-03: qty 10

## 2022-07-03 MED ORDER — SODIUM CHLORIDE (PF) 0.9 % IJ SOLN
INTRAMUSCULAR | Status: AC
  Filled 2022-07-03: qty 10

## 2022-07-03 MED ORDER — CEFAZOLIN SODIUM-DEXTROSE 2-4 GM/100ML-% IV SOLN
2.0000 g | Freq: Three times a day (TID) | INTRAVENOUS | Status: AC
  Administered 2022-07-03 (×2): 2 g via INTRAVENOUS
  Filled 2022-07-03 (×2): qty 100

## 2022-07-03 MED ORDER — ONDANSETRON HCL 4 MG/2ML IJ SOLN
INTRAMUSCULAR | Status: AC
  Filled 2022-07-03: qty 2

## 2022-07-03 MED ORDER — ONDANSETRON HCL 4 MG/2ML IJ SOLN: 4.0000 mg | Freq: Once | INTRAMUSCULAR | Status: DC | PRN

## 2022-07-03 MED ORDER — ACETAMINOPHEN 10 MG/ML IV SOLN: 1000.0000 mg | Freq: Once | INTRAVENOUS | Status: DC | PRN

## 2022-07-03 MED ORDER — OXYCODONE HCL 5 MG PO TABS
2.5000 mg | ORAL_TABLET | ORAL | Status: DC | PRN
  Administered 2022-07-04 – 2022-07-11 (×7): 5 mg via ORAL
  Filled 2022-07-03 (×7): qty 1

## 2022-07-03 MED ORDER — LACTATED RINGERS IV SOLN: INTRAVENOUS | Status: DC

## 2022-07-03 MED ORDER — FENTANYL CITRATE (PF) 100 MCG/2ML IJ SOLN
INTRAMUSCULAR | Status: DC | PRN
  Administered 2022-07-03: 25 ug via INTRAVENOUS

## 2022-07-03 MED ORDER — BUPIVACAINE LIPOSOME 1.3 % IJ SUSP
INTRAMUSCULAR | Status: AC
  Filled 2022-07-03: qty 20

## 2022-07-03 MED ORDER — SUGAMMADEX SODIUM 200 MG/2ML IV SOLN
INTRAVENOUS | Status: DC | PRN
  Administered 2022-07-03: 200 mg via INTRAVENOUS

## 2022-07-03 MED ORDER — SODIUM CHLORIDE (PF) 0.9 % IJ SOLN
INTRAMUSCULAR | Status: AC
  Filled 2022-07-03: qty 50

## 2022-07-03 MED ORDER — TRANEXAMIC ACID-NACL 1000-0.7 MG/100ML-% IV SOLN
INTRAVENOUS | Status: DC | PRN
  Administered 2022-07-03: 1000 mg via INTRAVENOUS

## 2022-07-03 MED ORDER — FENTANYL CITRATE PF 50 MCG/ML IJ SOSY
PREFILLED_SYRINGE | INTRAMUSCULAR | Status: AC
  Filled 2022-07-03: qty 2

## 2022-07-03 SURGICAL SUPPLY — 75 items
BAG COUNTER SPONGE SURGICOUNT (BAG) IMPLANT
BAG DECANTER FOR FLEXI CONT (MISCELLANEOUS) ×1 IMPLANT
BAG ZIPLOCK 12X15 (MISCELLANEOUS) ×1 IMPLANT
BLADE SAW SAG 25X90X1.19 (BLADE) IMPLANT
BRUSH FEMORAL CANAL (MISCELLANEOUS) IMPLANT
CEMENT BONE SIMPLEX SPEEDSET (Cement) IMPLANT
CEMENT RESTRICTOR BONE PREP ST (Cement) IMPLANT
CHLORAPREP W/TINT 26 (MISCELLANEOUS) ×2 IMPLANT
COVER SURGICAL LIGHT HANDLE (MISCELLANEOUS) ×1 IMPLANT
DERMABOND ADVANCED .7 DNX12 (GAUZE/BANDAGES/DRESSINGS) ×1 IMPLANT
DRAPE 3/4 80X56 (DRAPES) ×2 IMPLANT
DRAPE HALF SHEET 70X43 (DRAPES) ×2 IMPLANT
DRAPE HIP W/POCKET STRL (MISCELLANEOUS) ×1 IMPLANT
DRAPE INCISE IOBAN 66X45 STRL (DRAPES) ×1 IMPLANT
DRAPE INCISE IOBAN 85X60 (DRAPES) ×1 IMPLANT
DRAPE POUCH INSTRU U-SHP 10X18 (DRAPES) ×1 IMPLANT
DRAPE SHEET LG 3/4 BI-LAMINATE (DRAPES) ×1 IMPLANT
DRAPE SURG 17X11 SM STRL (DRAPES) IMPLANT
DRAPE U-SHAPE 47X51 STRL (DRAPES) ×2 IMPLANT
DRESSING AQUACEL AG SP 3.5X10 (GAUZE/BANDAGES/DRESSINGS) ×1 IMPLANT
DRSG AQUACEL AG ADV 3.5X 6 (GAUZE/BANDAGES/DRESSINGS) ×1 IMPLANT
DRSG AQUACEL AG ADV 3.5X10 (GAUZE/BANDAGES/DRESSINGS) IMPLANT
DRSG AQUACEL AG SP 3.5X10 (GAUZE/BANDAGES/DRESSINGS) ×1
ELECT BLADE TIP CTD 4 INCH (ELECTRODE) ×1 IMPLANT
ELECT REM PT RETURN 15FT ADLT (MISCELLANEOUS) ×1 IMPLANT
FEMORAL HEAD LFIT V40 28MM PL0 (Orthopedic Implant) IMPLANT
GLOVE BIOGEL PI IND STRL 8 (GLOVE) ×2 IMPLANT
GLOVE SURG LX STRL 8.0 MICRO (GLOVE) ×2 IMPLANT
GLOVE SURG ORTHO 8.0 STRL STRW (GLOVE) ×1 IMPLANT
GOWN STRL REUS W/ TWL XL LVL3 (GOWN DISPOSABLE) ×1 IMPLANT
GOWN STRL REUS W/TWL XL LVL3 (GOWN DISPOSABLE) ×1
HANDPIECE INTERPULSE COAX TIP (DISPOSABLE) ×1
HEAD UNIV BIP OD 28X51 (Head) IMPLANT
HOLDER FOLEY CATH W/STRAP (MISCELLANEOUS) ×1 IMPLANT
HOOD PEEL AWAY T7 (MISCELLANEOUS) ×3 IMPLANT
IMMOBILIZER KNEE 20 (SOFTGOODS) IMPLANT
IMMOBILIZER KNEE 20 THIGH 36 (SOFTGOODS) IMPLANT
JET LAVAGE IRRISEPT WOUND (IRRIGATION / IRRIGATOR)
KIT BASIN OR (CUSTOM PROCEDURE TRAY) ×1 IMPLANT
KIT TURNOVER KIT A (KITS) IMPLANT
LAVAGE JET IRRISEPT WOUND (IRRIGATION / IRRIGATOR) IMPLANT
MANIFOLD NEPTUNE II (INSTRUMENTS) ×1 IMPLANT
MARKER SKIN DUAL TIP RULER LAB (MISCELLANEOUS) ×1 IMPLANT
NS IRRIG 1000ML POUR BTL (IV SOLUTION) ×1 IMPLANT
PACK TOTAL JOINT (CUSTOM PROCEDURE TRAY) ×1 IMPLANT
PROTECTOR NERVE ULNAR (MISCELLANEOUS) ×1 IMPLANT
RETRACTOR YANK SUCT EIGR SABER (INSTRUMENTS) ×1 IMPLANT
RETRIEVER SUT HEWSON (MISCELLANEOUS) ×1 IMPLANT
SEALER BIPOLAR AQUA 6.0 (INSTRUMENTS) ×1 IMPLANT
SET HNDPC FAN SPRY TIP SCT (DISPOSABLE) ×1 IMPLANT
SET INTERPULSE LAVAGE W/TIP (ORTHOPEDIC DISPOSABLE SUPPLIES) ×1 IMPLANT
SLEEVE STOCKINETTE LIMB 4X8 (MISCELLANEOUS) ×1 IMPLANT
SLEEVE STOCKINETTE LIMB 6X9 (MISCELLANEOUS) ×1 IMPLANT
SPIKE FLUID TRANSFER (MISCELLANEOUS) ×3 IMPLANT
STAPLER VISISTAT 35W (STAPLE) IMPLANT
STEM HIP ACCOLADE SZ5 37X145 (Stem) IMPLANT
SUCTION FRAZIER HANDLE 12FR (TUBING) ×1
SUCTION TUBE FRAZIER 12FR DISP (TUBING) ×1 IMPLANT
SUT BONE WAX W31G (SUTURE) ×1 IMPLANT
SUT ETHIBOND #5 BRAIDED 30INL (SUTURE) ×1 IMPLANT
SUT MNCRL AB 3-0 PS2 18 (SUTURE) ×1 IMPLANT
SUT STRATAFIX 0 PDS 27 VIOLET (SUTURE) ×1
SUT STRATAFIX 1PDS 45CM VIOLET (SUTURE) ×1 IMPLANT
SUT STRATAFIX PDO 1 14 VIOLET (SUTURE) ×1
SUT STRATFX PDO 1 14 VIOLET (SUTURE) ×1
SUT VIC AB 2-0 CT2 27 (SUTURE) ×2 IMPLANT
SUT VICRYL 0 27 CT2 27 ABS (SUTURE) ×2 IMPLANT
SUTURE STRATFX 0 PDS 27 VIOLET (SUTURE) ×1 IMPLANT
SUTURE STRATFX PDO 1 14 VIOLET (SUTURE) ×1 IMPLANT
SYR 20ML LL LF (SYRINGE) ×1 IMPLANT
TOWEL OR 17X26 10 PK STRL BLUE (TOWEL DISPOSABLE) ×1 IMPLANT
TOWER CARTRIDGE SMART MIX (DISPOSABLE) ×1 IMPLANT
TRAY FOLEY MTR SLVR 16FR STAT (SET/KITS/TRAYS/PACK) ×1 IMPLANT
TUBE KAMVAC SUCTION (TUBING) IMPLANT
WATER STERILE IRR 1000ML POUR (IV SOLUTION) ×2 IMPLANT

## 2022-07-03 NOTE — Op Note (Signed)
07/03/2022  1:00 PM  PATIENT:  Adam Arellano   MRN: [id number]  PRE-OPERATIVE DIAGNOSIS: Right displaced femoral neck fracture  POST-OPERATIVE DIAGNOSIS: Same  PROCEDURE:  Procedure(s): Bipolar hip hemiarthroplasty on the right  PREOPERATIVE INDICATIONS:  Adam Arellano is an 89 y.o. male who was admitted 07/01/2022 with a diagnosis of Closed right hip fracture [facility]) and elected for surgical management.  The risks benefits and alternatives were discussed with the patient including but not limited to the risks of nonoperative treatment, versus surgical intervention including infection, bleeding, nerve injury, periprosthetic fracture, the need for revision surgery, dislocation, leg length discrepancy, blood clots, cardiopulmonary complications, morbidity, mortality, among others, and they were willing to proceed.  Predicted outcome is good, although there will be at least a six to nine month expected recovery.   OPERATIVE REPORT     SURGEON:  Charlies Constable, MD    ASSISTANT: Izola Price, RNFA (Present throughout the entire procedure,  necessary for completion of procedure in a timely manner, assisting with retraction, instrumentation, and closure)     ANESTHESIA: General  ESTIMATED BLOOD LOSS: [id number]    COMPLICATIONS:  None.   COMPONENTS:  Implant Name Type Inv. Item Serial No. Manufacturer Lot No. LRB No. Used Action  STEM HIP ACCOLADE SZ5 [id number] - [id number] Stem STEM HIP ACCOLADE SZ5 [id number]  [employer] [id number] Right 1 Implanted  CEMENT BONE SIMPLEX SPEEDSET - [id number] Cement CEMENT BONE SIMPLEX SPEEDSET  [employer] [id number] Right 1 Implanted  CEMENT BONE SIMPLEX SPEEDSET - [id number] Cement CEMENT BONE SIMPLEX SPEEDSET  [employer] [id number] Right 1 Implanted  CEMENT RESTRICTOR BONE PREP ST - [id number] Cement CEMENT RESTRICTOR BONE PREP ST  [employer] [id number] Right 1 Implanted  FEMORAL HEAD LFIT V40 28MM PL0 - [id number] Orthopedic Implant  FEMORAL HEAD LFIT V40 28MM PL0  [employer] [id number] Right 1 Implanted  HEAD UNIV BIP OD 28X51 - [id number] Head HEAD UNIV BIP OD 28X51  [employer] [id number] Right 1 Implanted      PROCEDURE IN DETAIL: The patient was met in the holding area and identified.  The appropriate hip  was marked at the operative site. The patient was then transported to the OR and  placed under anesthesia.  At that point, the patient was  placed in the lateral decubitus position with the operative side up and  secured to the operating room table and all bony prominences padded. A subaxillary role was placed.    The operative lower extremity was prepped from the iliac crest to the ankle.  Sterile draping was performed.  2g of ancef and 1g TXA were given prior to incision. Time out was performed prior to incision.      A routine posterolateral approach was utilized via sharp dissection  carried down to the subcutaneous tissue.  Gross bleeders were Bovie  coagulated.  The iliotibial band was identified and incised  along the length of the skin incision.  A Charnley retractor was inserted with care to protect the sciatic nerve.  With the hip internally rotated, the short external rotators  were identified. The piriformis was tagged with #5 Ethibond, and the hip capsule released in a T-type fashion, and posterior sleeve of the capsule was also tagged.  The femoral neck was exposed, and I resected the femoral neck using the appropriate jig. This was performed at approximately a thumb's breadth above the lesser trochanter.    I then exposed the deep acetabulum, cleared out any tissue including the ligamentum teres.  I then prepared the proximal femur using the box cutter, Charnley awl, and then sequentially broached.  A trial utilized, and I reduced the hip, leg lengths were assessed clinically and felt to be equal. The hip was then taken through a full range of motion, the hip was stable at full extension and 90  degrees external rotation without anterior subluxation. The hip was also stable in the position of sleep, and in neutral abduction up to 90 degrees flexion, and 90 degrees IR. The trial components were then removed.   We then prepared canal for cementation.  The cement restrictor was measured and inserted distally.  The canal was then irrigated with the pulse lavage and 3 L of normal saline.  2 bags of Simplex cement were prepared.  Using the cement gun the cement was inserted distally and the canal was filled.  We then pressurized the canal. The real implant was then inserted matching the patient's native anteversion of approximately 25 degrees.  We then waited for 14 minutes for the cement to be fully set.  Excess cement was removed.  A lap was placed in the acetabulum prior to cementing was also removed and the acetabulum was assessed to make sure there was no cement or bone fragments.  The hip was then reduced with the trial head again and taken through functional range of motion and found to have excellent stability. Leg lengths were restored. The real head was then impacted onto the stem and the hip was again reduced.  The capsule was then repaired with #5 Ethibond., and the piriformis was repaired to the abductor tendon. Excellent posterior capsular repair was achieved.   I then irrigated the hip copiously again with pulse lavage. The wounds were injected with 20cc exparal diluted in sterile saline. The fascia and IT band was repaired with #1 stratafix, followed by 0 stratafix for the fat layer followed by 2-0 Vicryl and running 3-0 Monocryl for the skin, Dermabond was applied and an Aquacel dressing was placed.  The patient was then awakened and returned to PACU in stable and satisfactory condition. There were no complications.  Post op recs: WB: WBAT with posterior hip precautions x6 weeks Abx: ancef x23 hours post op Imaging: PACU xrays Dressing: Aquacel dressing to be kept intact until  follow-up DVT prophylaxis: lovenox starting POD1 x4 weeks Follow up: 2 weeks after surgery for a wound check with Dr. Zachery Dakins at [facility].  Address: [address], [place], [place] [address]  Office Phone: [phone]   Charlies Constable, MD Orthopedic Surgeon  07/03/2022 1:00 PM

## 2022-07-03 NOTE — Anesthesia Preprocedure Evaluation (Addendum)
Anesthesia Evaluation  Patient identified by MRN, date of birth, ID band Patient awake    Reviewed: Allergy & Precautions, NPO status , Patient's Chart, lab work & pertinent test results  Airway Mallampati: II  TM Distance: >3 FB Neck ROM: Full    Dental no notable dental hx.    Pulmonary asthma , former smoker   Pulmonary exam normal        Cardiovascular hypertension, Pt. on home beta blockers and Pt. on medications + CAD, + Past MI, + Cardiac Stents and + Peripheral Vascular Disease  Normal cardiovascular exam     Neuro/Psych CVA  negative psych ROS   GI/Hepatic negative GI ROS, Neg liver ROS,,,  Endo/Other  negative endocrine ROS    Renal/GU Renal disease     Musculoskeletal negative musculoskeletal ROS (+)    Abdominal   Peds  Hematology  (+) Blood dyscrasia (Plavix), anemia   Anesthesia Other Findings RIGHT FEMORAL NECK FRACTURE  Reproductive/Obstetrics                             Anesthesia Physical Anesthesia Plan  ASA: 3  Anesthesia Plan: General   Post-op Pain Management:    Induction: Intravenous  PONV Risk Score and Plan: 2 and Ondansetron, Dexamethasone and Treatment may vary due to age or medical condition  Airway Management Planned: Oral ETT  Additional Equipment:   Intra-op Plan:   Post-operative Plan: Extubation in OR  Informed Consent: I have reviewed the patients History and Physical, chart, labs and discussed the procedure including the risks, benefits and alternatives for the proposed anesthesia with the patient or authorized representative who has indicated his/her understanding and acceptance.   Patient has DNR.   Dental advisory given  Plan Discussed with: CRNA  Anesthesia Plan Comments:        Anesthesia Quick Evaluation

## 2022-07-03 NOTE — Progress Notes (Signed)
PT Cancellation Note  Patient Details Name: Adam Arellano MRN: [id number] DOB: [birth year]   Cancelled Treatment:    Reason Eval/Treat Not Completed: Patient not medically ready (RLE still numb). Pt able to pump bil ankles, weak quad set and unable to perform SLR. Pt able reports numbness in RLE, still. Will hold PT eval until appropriate.  Spoke with pt's daughter in room, pt had stroke in October with R sided weakness and thrived at [employer]. Pt lives at home with daughter and her spouse, 24/7 assist, 6 steps to enter the home, progressed to no AD with household amb.   Tori Athene Schuhmacher PT, DPT 07/03/22, 3:02 PM

## 2022-07-03 NOTE — Transfer of Care (Signed)
Immediate Anesthesia Transfer of Care Note  Patient: Adam Arellano  Procedure(s) Performed: ARTHROPLASTY BIPOLAR HIP (HEMIARTHROPLASTY) (Right: Hip)  Patient Location: PACU  Anesthesia Type:General  Level of Consciousness: awake, alert , and confused  Airway & Oxygen Therapy: Patient Spontanous Breathing and Patient connected to face mask oxygen  Post-op Assessment: Report given to RN and Post -op Vital signs reviewed and stable  Post vital signs: Reviewed and stable  Last Vitals:  Vitals Value Taken Time  BP 152/90 07/03/22 1304  Temp    Pulse 89 07/03/22 1307  Resp 21 07/03/22 1307  SpO2 89 % 07/03/22 1307  Vitals shown include unvalidated device data.  Last Pain:  Vitals:   07/03/22 0954  TempSrc: Oral  PainSc:       Patients Stated Pain Goal: 2 (90/12/22 4114)  Complications: No notable events documented.

## 2022-07-03 NOTE — Anesthesia Procedure Notes (Signed)
Procedure Name: Intubation Date/Time: 07/03/2022 11:02 AM  Performed by: [place] Manus Rudd, CRNAPre-anesthesia Checklist: Patient identified, Emergency Drugs available, Suction available and Patient being monitored Patient Re-evaluated:Patient Re-evaluated prior to induction Oxygen Delivery Method: Circle system utilized Preoxygenation: Pre-oxygenation with 100% oxygen Induction Type: IV induction Ventilation: Mask ventilation without difficulty Tube type: Oral Number of attempts: 1 Airway Equipment and Method: Stylet and Oral airway Placement Confirmation: ETT inserted through vocal cords under direct vision, positive ETCO2 and breath sounds checked- equal and bilateral Tube secured with: Tape Dental Injury: Teeth and Oropharynx as per pre-operative assessment

## 2022-07-03 NOTE — Interval H&P Note (Signed)
The patient has been re-examined, and the chart reviewed, and there have been no interval changes to the documented history and physical.    Plan for R hip cemented hemiarthroplasty for displaced femoral neck fracture  The operative side was examined and the patient was confirmed to have sensation to DPN, SPN, TN intact, Motor EHL, ext, flex 5/5, and DP 2+, PT 2+, No significant edema.   The risks, benefits, and alternatives have been discussed at length with patient's family, and the patient is willing to proceed.  Right hip marked. Consent has been signed.

## 2022-07-03 NOTE — Discharge Instructions (Signed)
INSTRUCTIONS AFTER JOINT REPLACEMENT   Remove items at home which could result in a fall. This includes throw rugs or furniture in walking pathways ICE to the affected joint every three hours while awake for 30 minutes at a time, for at least the first 3-5 days, and then as needed for pain and swelling.  Continue to use ice for pain and swelling. You may notice swelling that will progress down to the foot and ankle.  This is normal after surgery.  Elevate your leg when you are not up walking on it.   Continue to use the breathing machine you got in the hospital (incentive spirometer) which will help keep your temperature down.  It is common for your temperature to cycle up and down following surgery, especially at night when you are not up moving around and exerting yourself.  The breathing machine keeps your lungs expanded and your temperature down.   DIET:  As you were doing prior to hospitalization, we recommend a well-balanced diet.  DRESSING / WOUND CARE / SHOWERING  Keep the surgical dressing until follow up.  The dressing is water proof, so you can shower without any extra covering.  IF THE DRESSING FALLS OFF or the wound gets wet inside, change the dressing with sterile gauze.  Please use good hand washing techniques before changing the dressing.  Do not use any lotions or creams on the incision until instructed by your surgeon.    ACTIVITY  Increase activity slowly as tolerated, but follow the weight bearing instructions below.   No driving for 6 weeks or until further direction given by your physician.  You cannot drive while taking narcotics.  No lifting or carrying greater than 10 lbs. until further directed by your surgeon. Avoid periods of inactivity such as sitting longer than an hour when not asleep. This helps prevent blood clots.  You may return to work once you are authorized by your doctor.     WEIGHT BEARING   Weight bearing as tolerated with assist device (walker, cane,  etc) as directed, use it as long as suggested by your surgeon or therapist, typically at least 4-6 weeks.   EXERCISES  Results after joint replacement surgery are often greatly improved when you follow the exercise, range of motion and muscle strengthening exercises prescribed by your doctor. Safety measures are also important to protect the joint from further injury. Any time any of these exercises cause you to have increased pain or swelling, decrease what you are doing until you are comfortable again and then slowly increase them. If you have problems or questions, call your caregiver or physical therapist for advice.   Rehabilitation is important following a joint replacement. After just a few days of immobilization, the muscles of the leg can become weakened and shrink (atrophy).  These exercises are designed to build up the tone and strength of the thigh and leg muscles and to improve motion. Often times heat used for twenty to thirty minutes before working out will loosen up your tissues and help with improving the range of motion but do not use heat for the first two weeks following surgery (sometimes heat can increase post-operative swelling).   These exercises can be done on a training (exercise) mat, on the floor, on a table or on a bed. Use whatever works the best and is most comfortable for you.    Use music or television while you are exercising so that the exercises are a pleasant break in your  day. This will make your life better with the exercises acting as a break in your routine that you can look forward to.   Perform all exercises about fifteen times, three times per day or as directed.  You should exercise both the operative leg and the other leg as well.  Exercises include:   Quad Sets - Tighten up the muscle on the front of the thigh (Quad) and hold for 5-10 seconds.   Straight Leg Raises - With your knee straight (if you were given a brace, keep it on), lift the leg to 60  degrees, hold for 3 seconds, and slowly lower the leg.  Perform this exercise against resistance later as your leg gets stronger.  Leg Slides: Lying on your back, slowly slide your foot toward your buttocks, bending your knee up off the floor (only go as far as is comfortable). Then slowly slide your foot back down until your leg is flat on the floor again.  Angel Wings: Lying on your back spread your legs to the side as far apart as you can without causing discomfort.  Hamstring Strength:  Lying on your back, push your heel against the floor with your leg straight by tightening up the muscles of your buttocks.  Repeat, but this time bend your knee to a comfortable angle, and push your heel against the floor.  You may put a pillow under the heel to make it more comfortable if necessary.   A rehabilitation program following joint replacement surgery can speed recovery and prevent re-injury in the future due to weakened muscles. Contact your doctor or a physical therapist for more information on knee rehabilitation.    CONSTIPATION  Constipation is defined medically as fewer than three stools per week and severe constipation as less than one stool per week.  Even if you have a regular bowel pattern at home, your normal regimen is likely to be disrupted due to multiple reasons following surgery.  Combination of anesthesia, postoperative narcotics, change in appetite and fluid intake all can affect your bowels.   YOU MUST use at least one of the following options; they are listed in order of increasing strength to get the job done.  They are all available over the counter, and you may need to use some, POSSIBLY even all of these options:    Drink plenty of fluids (prune juice may be helpful) and high fiber foods Colace 100 mg by mouth twice a day  Senokot for constipation as directed and as needed Dulcolax (bisacodyl), take with full glass of water  Miralax (polyethylene glycol) once or twice a day as  needed.  If you have tried all these things and are unable to have a bowel movement in the first 3-4 days after surgery call either your surgeon or your primary doctor.    If you experience loose stools or diarrhea, hold the medications until you stool forms back up.  If your symptoms do not get better within 1 week or if they get worse, check with your doctor.  If you experience "the worst abdominal pain ever" or develop nausea or vomiting, please contact the office immediately for further recommendations for treatment.   ITCHING:  If you experience itching with your medications, try taking only a single pain pill, or even half a pain pill at a time.  You can also use Benadryl over the counter for itching or also to help with sleep.   TED HOSE STOCKINGS:  Use stockings on both  legs until for at least 2 weeks or as directed by physician office. They may be removed at night for sleeping.  MEDICATIONS:  See your medication summary on the "After Visit Summary" that nursing will review with you.  You may have some home medications which will be placed on hold until you complete the course of blood thinner medication.  It is important for you to complete the blood thinner medication as prescribed.   Blood clot prevention (DVT Prophylaxis): After surgery you are at an increased risk for a blood clot. you were prescribed a blood thinner,lovenox, to be taken daily for a total of 4 weeks from surgery to help reduce your risk of getting a blood clot. This will help prevent a blood clot. Signs of a pulmonary embolus (blood clot in the lungs) include sudden short of breath, feeling lightheaded or dizzy, chest pain with a deep breath, rapid pulse rapid breathing. Signs of a blood clot in your arms or legs include new unexplained swelling and cramping, warm, red or darkened skin around the painful area. Please call the office or 911 right away if these signs or symptoms develop.  PRECAUTIONS:  If you experience  chest pain or shortness of breath - call 911 immediately for transfer to the hospital emergency department.   If you develop a fever greater that 101 F, purulent drainage from wound, increased redness or drainage from wound, foul odor from the wound/dressing, or calf pain - CONTACT YOUR SURGEON.                                                   FOLLOW-UP APPOINTMENTS:  If you do not already have a post-op appointment, please call the office for an appointment to be seen by your surgeon.  Guidelines for how soon to be seen are listed in your "After Visit Summary", but are typically between 2-3 weeks after surgery.  OTHER INSTRUCTIONS:   POST-OPERATIVE OPIOID TAPER INSTRUCTIONS: It is important to wean off of your opioid medication as soon as possible. If you do not need pain medication after your surgery it is ok to stop day one. Opioids include: Codeine, Hydrocodone(Norco, Vicodin), Oxycodone(Percocet, oxycontin) and hydromorphone amongst others.  Long term and even short term use of opiods can cause: Increased pain response Dependence Constipation Depression Respiratory depression And more.  Withdrawal symptoms can include Flu like symptoms Nausea, vomiting And more Techniques to manage these symptoms Hydrate well Eat regular healthy meals Stay active Use relaxation techniques(deep breathing, meditating, yoga) Do Not substitute Alcohol to help with tapering If you have been on opioids for less than two weeks and do not have pain than it is ok to stop all together.  Plan to wean off of opioids This plan should start within one week post op of your joint replacement. Maintain the same interval or time between taking each dose and first decrease the dose.  Cut the total daily intake of opioids by one tablet each day Next start to increase the time between doses. The last dose that should be eliminated is the evening dose.   MAKE SURE YOU:  Understand these instructions.  Get help  right away if you are not doing well or get worse.    Thank you for letting us be a part of your medical care team.  It is a privilege  we respect greatly.  We hope these instructions will help you stay on track for a fast and full recovery!

## 2022-07-03 NOTE — Anesthesia Postprocedure Evaluation (Signed)
Anesthesia Post Note  Patient: Adam Arellano  Procedure(s) Performed: ARTHROPLASTY BIPOLAR HIP (HEMIARTHROPLASTY) (Right: Hip)     Patient location during evaluation: PACU Anesthesia Type: General Level of consciousness: awake Pain management: pain level controlled Vital Signs Assessment: post-procedure vital signs reviewed and stable Respiratory status: spontaneous breathing, nonlabored ventilation and respiratory function stable Cardiovascular status: blood pressure returned to baseline and stable Postop Assessment: no apparent nausea or vomiting Anesthetic complications: no   No notable events documented.  Last Vitals:  Vitals:   07/03/22 1740 07/03/22 2038  BP:  (!) 132/98  Pulse: 95 94  Resp:  19  Temp:  37.4 C  SpO2: 98% 99%    Last Pain:  Vitals:   07/03/22 2038  TempSrc: Oral  PainSc:                  Karyl Kinnier Cedric Denison

## 2022-07-03 NOTE — Progress Notes (Signed)
[facility]                                                                              Adam Arellano, is a 89 y.o. male, DOB - 11/11/1931, [id number] Admit date - 07/01/2022    Outpatient Primary MD for the patient is [name], [name], MD  LOS - 1  days  Chief Complaint  Patient presents with   Fall       Brief summary   Patient is a 89 year old male with a history of CVA, prostate CA status post radical cystectomy, mitral valve clip, HTN, PVD, CAD, dyslipidemia, cognitive issues presented with mechanical fall and right hip pain.  Patient lives at home with his daughter and fell on his right hip while eating a boutique a day before the admission.  He was unable to stand up and was in pain.  No loss of consciousness, no chest pain, palpitations, dizziness or lightheadedness. Hip/pelvic x-ray showed acute right femoral neck fracture Patient was admitted for further workup.  Assessment & Plan    Principal Problem: Mechanical fall with closed right hip fracture ([place]) -Orthopedics consulted, plan for OR today -Pain controlled, continue n.p.o., gentle hydration -Pain control, DVT prophylaxis per orthopedics -Currently at home with his daughter. If requires rehab, patient's daughter requested CIR after surgery -Continue to hold aspirin and Plavix  Active Problems:    Primary hypertension -Continue Lopressor -Continue IV hydralazine as needed with parameters    PVD (peripheral vascular disease) (HCC) -Hold aspirin, Plavix, will resume after surgery      Coronary artery disease -Currently no chest pain or shortness of breath -Continue beta-blocker.   -Losartan currently held due to renal dysfunction -Hold aspirin, Plavix   stage 3b chronic kidney disease (HCC) -Baseline creatinine ~1.8.  Creatinine currently at baseline -BP stable, currently holding Lasix, losartan. -Will resume after surgery.    H/O: CVA (cerebrovascular accident) -Currently no  acute FND's, -Aspirin, Plavix on hold.    Asthma, chronic -Currently stable, no wheezing.     Fall from ground level -PT OT evaluation after the surgery -If patient requires rehab, patient's daughter requested CIR  Estimated body mass index is 20.97 kg/m as calculated from the following:   Height as of 06/30/22: '5\' 9"'$  (1.753 m).   Weight as of 06/30/22: 64.4 kg.  Code Status: DNR DVT Prophylaxis:  Sequential compression device to OR Start: 07/02/22 2026 enoxaparin (LOVENOX) injection 30 mg Start: 07/02/22 1000 SCDs Start: 07/02/22 0225   Level of Care: Level of care: Telemetry Family Communication: Updated patient's daughter at the bedside on 1/28 Disposition Plan:      Remains inpatient appropriate: Plan for or today   Procedures:  None  Consultants:   Orthopedics  Antimicrobials: None    Medications  chlorhexidine  15 mL Mouth/Throat Once   Or   mouth rinse  15 mL Mouth Rinse Once   [MAR Hold] docusate sodium  100 mg Oral BID   [MAR Hold] enoxaparin (LOVENOX) injection  30 mg Subcutaneous Q24H   [MAR Hold] gabapentin  100 mg Oral QHS   [MAR Hold] metoprolol tartrate  25 mg Oral BID   [  MAR Hold] senna-docusate  2 tablet Oral QHS      Subjective:   Adam Arellano was seen and examined today.  Right hip pain controlled, no acute complaints this morning.  Awaiting surgery.    Objective:   Vitals:   07/02/22 1732 07/02/22 2119 07/03/22 0516 07/03/22 0954  BP: 127/79 (!) 140/68 (!) 152/73 (!) 157/88  Pulse: (!) 47 98 68 70  Resp: '16 16 18 18  '$ Temp: 98.7 F (37.1 C) 97.6 F (36.4 C) 97.8 F (36.6 C) 98 F (36.7 C)  TempSrc: Oral Oral Oral Oral  SpO2: 91% 96% 93% 96%    Intake/Output Summary (Last 24 hours) at 07/03/2022 1012 Last data filed at 07/03/2022 0600 Gross per 24 hour  Intake 2010 ml  Output 950 ml  Net 1060 ml     Wt Readings from Last 3 Encounters:  06/30/22 64.4 kg  06/01/22 65.3 kg  05/19/22 67.6 kg   Physical Exam General:  Alert and oriented x 3, NAD Cardiovascular: S1 S2 clear, RRR.  Respiratory: CTAB, no wheezing Gastrointestinal: Soft, nontender, nondistended, NBS Ext: no pedal edema bilaterally Neuro: no new deficits Psych: Normal affect, pleasant    Data Reviewed:  I have personally reviewed following labs    CBC Lab Results  Component Value Date   WBC 10.8 (H) 07/03/2022   RBC 3.44 (L) 07/03/2022   HGB 11.0 (L) 07/03/2022   HCT 34.2 (L) 07/03/2022   MCV 99.4 07/03/2022   MCH 32.0 07/03/2022   PLT 207 07/03/2022   MCHC 32.2 07/03/2022   RDW 13.1 07/03/2022   LYMPHSABS 0.9 07/01/2022   MONOABS 0.6 07/01/2022   EOSABS 0.1 07/01/2022   BASOSABS 0.0 00/34/9179     Last metabolic panel Lab Results  Component Value Date   NA 136 07/03/2022   K 3.4 (L) 07/03/2022   CL 103 07/03/2022   CO2 24 07/03/2022   BUN 27 (H) 07/03/2022   CREATININE 1.64 (H) 07/03/2022   GLUCOSE 99 07/03/2022   GFRNONAA 40 (L) 07/03/2022   GFRAA 45 (L) 07/28/2020   CALCIUM 8.1 (L) 07/03/2022   PROT 5.7 (L) 04/07/2022   ALBUMIN 3.0 (L) 04/07/2022   BILITOT 0.7 04/07/2022   ALKPHOS 55 04/07/2022   AST 19 04/07/2022   ALT 16 04/07/2022   ANIONGAP 9 07/03/2022    CBG (last 3)  No results for input(s): "GLUCAP" in the last 72 hours.    Coagulation Profile: Recent Labs  Lab 07/01/22 1633 07/02/22 0502  INR 1.0 1.2     Radiology Studies: I have personally reviewed the imaging studies  DG FEMUR PORT, MIN 2 VIEWS RIGHT  Result Date: 07/01/2022 CLINICAL DATA:  Proximal femur fracture, preop. EXAM: RIGHT FEMUR PORTABLE 2 VIEW COMPARISON:  Hip radiograph earlier today that included the proximal femur. FINDINGS: The included femur is intact. No fracture of the mid or distal femur. Knee alignment is preserved. Cannot assess for joint effusion due to positioning. Vascular calcifications are seen. IMPRESSION: No fracture of the mid or distal femur. Electronically Signed   By: Keith Rake M.D.   On:  07/01/2022 21:09   DG HIP UNILAT WITH PELVIS 2-3 VIEWS RIGHT  Result Date: 07/01/2022 CLINICAL DATA:  Pain EXAM: DG HIP (WITH OR WITHOUT PELVIS) 2-3V RIGHT COMPARISON:  04/02/2022 FINDINGS: Acute fracture of the right femoral neck with mild proximal migration and external rotation. Right hip joint remains intact without dislocation. Mild osteoarthritic changes of both hips. No evidence of pelvic fracture or diastasis. Bones  are demineralized. No suspicious bone lesion is identified. IMPRESSION: Acute right femoral neck fracture. Electronically Signed   By: Davina Poke D.O.   On: 07/01/2022 16:56       Adam Arellano M.D. [facility] 07/03/2022, 10:12 AM  Available via Epic secure chat 7am-7pm After 7 pm, please refer to night coverage provider listed on amion.

## 2022-07-04 ENCOUNTER — Encounter: Payer: Medicare Other | Admitting: Occupational Therapy

## 2022-07-04 ENCOUNTER — Encounter (HOSPITAL_COMMUNITY): Payer: Self-pay | Admitting: Orthopedic Surgery

## 2022-07-04 ENCOUNTER — Ambulatory Visit: Payer: Medicare Other | Admitting: Physical Therapy

## 2022-07-04 DIAGNOSIS — W1830XA Fall on same level, unspecified, initial encounter: Secondary | ICD-10-CM | POA: Diagnosis not present

## 2022-07-04 DIAGNOSIS — I2510 Atherosclerotic heart disease of native coronary artery without angina pectoris: Secondary | ICD-10-CM | POA: Diagnosis not present

## 2022-07-04 DIAGNOSIS — S72001A Fracture of unspecified part of neck of right femur, initial encounter for closed fracture: Secondary | ICD-10-CM | POA: Diagnosis not present

## 2022-07-04 LAB — RETICULOCYTES
Immature Retic Fract: 11.6 % (ref 2.3–15.9)
RBC.: 3.48 MIL/uL — ABNORMAL LOW (ref 4.22–5.81)
Retic Count, Absolute: 55.3 10*3/uL (ref 19.0–186.0)
Retic Ct Pct: 1.6 % (ref 0.4–3.1)

## 2022-07-04 LAB — CBC
HCT: 31.6 % — ABNORMAL LOW (ref 39.0–52.0)
Hemoglobin: 9.9 g/dL — ABNORMAL LOW (ref 13.0–17.0)
MCH: 31.4 pg (ref 26.0–34.0)
MCHC: 31.3 g/dL (ref 30.0–36.0)
MCV: 100.3 fL — ABNORMAL HIGH (ref 80.0–100.0)
Platelets: 200 10*3/uL (ref 150–400)
RBC: 3.15 MIL/uL — ABNORMAL LOW (ref 4.22–5.81)
RDW: 13 % (ref 11.5–15.5)
WBC: 10.6 10*3/uL — ABNORMAL HIGH (ref 4.0–10.5)
nRBC: 0 % (ref 0.0–0.2)

## 2022-07-04 LAB — IRON AND TIBC
Iron: 23 ug/dL — ABNORMAL LOW (ref 45–182)
Saturation Ratios: 9 % — ABNORMAL LOW (ref 17.9–39.5)
TIBC: 245 ug/dL — ABNORMAL LOW (ref 250–450)
UIBC: 222 ug/dL

## 2022-07-04 LAB — BASIC METABOLIC PANEL
Anion gap: 9 (ref 5–15)
BUN: 28 mg/dL — ABNORMAL HIGH (ref 8–23)
CO2: 20 mmol/L — ABNORMAL LOW (ref 22–32)
Calcium: 7.8 mg/dL — ABNORMAL LOW (ref 8.9–10.3)
Chloride: 106 mmol/L (ref 98–111)
Creatinine, Ser: 1.73 mg/dL — ABNORMAL HIGH (ref 0.61–1.24)
GFR, Estimated: 38 mL/min — ABNORMAL LOW (ref >60)
Glucose, Bld: 108 mg/dL — ABNORMAL HIGH (ref 70–99)
Potassium: 3.8 mmol/L (ref 3.5–5.1)
Sodium: 135 mmol/L (ref 135–145)

## 2022-07-04 LAB — FERRITIN: Ferritin: 111 ng/mL (ref 24–336)

## 2022-07-04 LAB — FOLATE: Folate: 8.2 ng/mL (ref >5.9)

## 2022-07-04 LAB — VITAMIN B12: Vitamin B-12: 216 pg/mL (ref 180–914)

## 2022-07-04 MED ORDER — POLYETHYLENE GLYCOL 3350 17 G PO PACK
17.0000 g | PACK | Freq: Every day | ORAL | Status: DC
  Administered 2022-07-04 – 2022-07-13 (×10): 17 g via ORAL
  Filled 2022-07-04 (×10): qty 1

## 2022-07-04 MED ORDER — ENSURE ENLIVE PO LIQD
237.0000 mL | Freq: Two times a day (BID) | ORAL | Status: DC
  Administered 2022-07-04 – 2022-07-11 (×13): 237 mL via ORAL

## 2022-07-04 MED ORDER — CLOPIDOGREL BISULFATE 75 MG PO TABS
75.0000 mg | ORAL_TABLET | Freq: Every day | ORAL | Status: DC
  Administered 2022-07-04 – 2022-07-10 (×7): 75 mg via ORAL
  Filled 2022-07-04 (×7): qty 1

## 2022-07-04 MED ORDER — ASPIRIN 81 MG PO TBEC
81.0000 mg | DELAYED_RELEASE_TABLET | Freq: Every day | ORAL | Status: DC
  Administered 2022-07-04 – 2022-07-13 (×10): 81 mg via ORAL
  Filled 2022-07-04 (×10): qty 1

## 2022-07-04 MED ORDER — BISACODYL 10 MG RE SUPP
10.0000 mg | Freq: Once | RECTAL | Status: DC
  Filled 2022-07-04: qty 1

## 2022-07-04 NOTE — Progress Notes (Signed)
Physical Therapy Treatment Patient Details Name: Adam Arellano MRN: [id number] DOB: February 07, 1932 Today's Date: 07/04/2022   History of Present Illness Adam Arellano is an 89 yo male who presents after fall at home, xray showed acute R femoral neck fracture. Pt s/p Bipolar hip hemiarthroplasty on the right 1/29. PMH: CVA, prostate CA status post radical cystectomy, mitral valve clip, HTN, PVD, CAD, dyslipidemia, cognitive issues    PT Comments    Pt back in bed, nursing assisted after lunch. Pt agreeable to supine exercises, needing assist on RLE with hip abd/add to midline and SAQ. Pt with some facial grimacing, though verbally denies pain. Pt assisted to reposition in bed, able to use LLE and mod A to scoot up in bed.   Recommendations for follow up therapy are one component of a multi-disciplinary discharge planning process, led by the attending physician.  Recommendations may be updated based on patient status, additional functional criteria and insurance authorization.  Follow Up Recommendations  Acute inpatient rehab (3hours/day)     Assistance Recommended at Discharge Frequent or constant Supervision/Assistance  Patient can return home with the following A lot of help with bathing/dressing/bathroom;Assistance with cooking/housework;Assist for transportation;Help with stairs or ramp for entrance;Two people to help with walking and/or transfers   Equipment Recommendations  None recommended by PT    Recommendations for Other Services       Precautions / Restrictions Precautions Precautions: Fall;Posterior Hip Precaution Booklet Issued: Yes (comment) Restrictions Weight Bearing Restrictions: No RLE Weight Bearing: Weight bearing as tolerated     Mobility  Bed Mobility  General bed mobility comments: pt able to bring LLE into hooklying position with therapist using bedpad to scoot pt up with mod A    Transfers   Ambulation/Gait    Stairs              Wheelchair Mobility    Modified Rankin (Stroke Patients Only)       Balance Overall balance assessment: Needs assistance Sitting-balance support: Feet supported Sitting balance-Leahy Scale: Poor Sitting balance - Comments: lean to L in sitting, needing min A to min guard with cues   Standing balance support: Reliant on assistive device for balance, During functional activity, Bilateral upper extremity supported Standing balance-Leahy Scale: Poor Standing balance comment: modA       Cognition Arousal/Alertness: Awake/alert Behavior During Therapy: WFL for tasks assessed/performed Overall Cognitive Status: History of cognitive impairments - at baseline  General Comments: pt pleasant, follows commands, HOH        Exercises General Exercises - Lower Extremity Ankle Circles/Pumps: AROM, Both, 10 reps, Supine Quad Sets: Supine, AROM, Strengthening, 10 reps, Both Short Arc Quad: Supine, Strengthening, Both, 10 reps Hip ABduction/ADduction: Supine, Strengthening, Both, 10 reps    General Comments        Pertinent Vitals/Pain Pain Assessment Pain Assessment: Faces Faces Pain Scale: Hurts even more Pain Location: R hip Pain Descriptors / Indicators: Grimacing, Guarding Pain Intervention(s): Limited activity within patient's tolerance, Monitored during session, Repositioned, Ice applied    Home Living                          Prior Function            PT Goals (current goals can now be found in the care plan section) Acute Rehab PT Goals Patient Stated Goal: home with family support PT Goal Formulation: With patient/family Time For Goal Achievement: 07/18/22 Potential to Achieve Goals: Good Progress towards PT  goals: Progressing toward goals    Frequency    Min 5X/week      PT Plan      Co-evaluation              AM-PAC PT "6 Clicks" Mobility   Outcome Measure  Help needed turning from your back to your side while in a flat bed  without using bedrails?: A Lot Help needed moving from lying on your back to sitting on the side of a flat bed without using bedrails?: A Lot Help needed moving to and from a bed to a chair (including a wheelchair)?: Total Help needed standing up from a chair using your arms (e.g., wheelchair or bedside chair)?: Total Help needed to walk in hospital room?: Total Help needed climbing 3-5 steps with a railing? : Total 6 Click Score: 8    End of Session Equipment Utilized During Treatment: Gait belt Activity Tolerance: Patient tolerated treatment well Patient left: in bed;with call bell/phone within reach;with bed alarm set Nurse Communication: Mobility status PT Visit Diagnosis: Other abnormalities of gait and mobility (R26.89);Muscle weakness (generalized) (M62.81);Pain;Difficulty in walking, not elsewhere classified (R26.2) Pain - Right/Left: Right Pain - part of body: Hip     Time: 0350-0938 PT Time Calculation (min) (ACUTE ONLY): 15 min  Charges:  $Therapeutic Exercise: 8-22 mins                      Tori Dane Bloch PT, DPT 07/04/22, 3:16 PM

## 2022-07-04 NOTE — Evaluation (Addendum)
Physical Therapy Evaluation Patient Details Name: Adam Arellano MRN: [id number] DOB: 1932/03/31 Today's Date: 07/04/2022  History of Present Illness  Adam Arellano is an 89 yo male who presents after fall at home, xray showed acute R femoral neck fracture. Pt s/p Bipolar hip hemiarthroplasty on the right 1/29. PMH: CVA, prostate CA status post radical cystectomy, mitral valve clip, HTN, PVD, CAD, dyslipidemia, cognitive issues  Clinical Impression  Pt admitted with above diagnosis. Pt from home with daughter and son in law, 5 steps to enter the home with handrails, ind with in home ambulation using RW or nothing, and daughter providing supv with self care as needed. Pt needing max A to come to sitting EOB and mod A+2 for STS and to pivot to bedside recliner, bil HHA to support. Heavy education regarding posterior hip precautions, provided written/illustrated paper. Pt tolerates RLE exercises, needing assist to complete ROM. Pt mod ind to supv at baseline and with good family support. Recommend CIR prior to return home with daughter and her spouse to assist pt 24/7 as needed. Pt currently with functional limitations due to the deficits listed below (see PT Problem List). Pt will benefit from skilled PT to increase their independence and safety with mobility to allow discharge to the venue listed below.          Recommendations for follow up therapy are one component of a multi-disciplinary discharge planning process, led by the attending physician.  Recommendations may be updated based on patient status, additional functional criteria and insurance authorization.  Follow Up Recommendations Acute inpatient rehab (3hours/day)      Assistance Recommended at Discharge Frequent or constant Supervision/Assistance  Patient can return home with the following  A lot of help with bathing/dressing/bathroom;Assistance with cooking/housework;Assist for transportation;Help with stairs or ramp for  entrance;Two people to help with walking and/or transfers    Equipment Recommendations None recommended by PT  Recommendations for Other Services       Functional Status Assessment Patient has had a recent decline in their functional status and demonstrates the ability to make significant improvements in function in a reasonable and predictable amount of time.     Precautions / Restrictions Precautions Precautions: Fall;Posterior Hip Precaution Booklet Issued: Yes (comment) Restrictions Weight Bearing Restrictions: No RLE Weight Bearing: Weight bearing as tolerated      Mobility  Bed Mobility Overal bed mobility: Needs Assistance Bed Mobility: Supine to Sit  Supine to sit: Max assist  General bed mobility comments: pt able to bring LLE into hooklying position with therapist using bedpad to scoot pt up with mod A    Transfers Overall transfer level: Needs assistance Equipment used: 2 person hand held assist Transfers: Sit to/from Stand, Bed to chair/wheelchair/BSC Sit to Stand: Mod assist, +2 physical assistance, From elevated surface  Step pivot transfers: Mod assist, +2 physical assistance  General transfer comment: mod A +2 to power up to standing from elevated bed, cues for posterior hip precautions, mod A+2 for step pivot to recliner, able to lift each foot with tiny steps over to recliner, cues for breathwork, able to maintain static supported standing for ~1 minute before sitting    Ambulation/Gait   Stairs            Wheelchair Mobility    Modified Rankin (Stroke Patients Only)       Balance Overall balance assessment: Needs assistance Sitting-balance support: Feet supported Sitting balance-Leahy Scale: Poor Sitting balance - Comments: lean to L in sitting, needing min A  to min guard with cues   Standing balance support: Reliant on assistive device for balance, During functional activity, Bilateral upper extremity supported Standing balance-Leahy  Scale: Poor Standing balance comment: modA       Pertinent Vitals/Pain Pain Assessment Pain Assessment: Faces Faces Pain Scale: Hurts even more Pain Location: R hip Pain Descriptors / Indicators: Grimacing, Guarding Pain Intervention(s): Limited activity within patient's tolerance, Monitored during session, Repositioned, Ice applied    Home Living                          Prior Function            Hand Dominance        Extremity/Trunk Assessment   Upper Extremity Assessment Upper Extremity Assessment: Defer to OT evaluation    Lower Extremity Assessment Lower Extremity Assessment: RLE deficits/detail;LLE deficits/detail RLE Deficits / Details: pt able to perform ankle pump, quad 3/5, hip abduction 2/5, hip flexion 2/-/5 RLE Sensation: WNL RLE Coordination: WNL LLE Deficits / Details: AROM WFL, strength grossly 3+/5 LLE Sensation: WNL LLE Coordination: WNL    Cervical / Trunk Assessment Cervical / Trunk Assessment: Kyphotic  Communication      Cognition Arousal/Alertness: Awake/alert Behavior During Therapy: WFL for tasks assessed/performed Overall Cognitive Status: History of cognitive impairments - at baseline  General Comments: pt pleasant, follows commands, HOH        General Comments      Exercises General Exercises - Lower Extremity Ankle Circles/Pumps: AROM, Both, 10 reps, Seated Quad Sets: AROM, Strengthening, 10 reps, Both, Seated Short Arc Quad: Strengthening, Both, 10 reps, Seated Hip ABduction/ADduction: Strengthening, Both, 10 reps, Seated   Assessment/Plan    PT Assessment Patient needs continued PT services  PT Problem List Decreased strength;Decreased range of motion;Decreased activity tolerance;Decreased balance;Decreased mobility;Decreased knowledge of use of DME;Decreased safety awareness;Decreased knowledge of precautions;Pain       PT Treatment Interventions DME instruction;Gait training;Stair training;Functional  mobility training;Therapeutic activities;Therapeutic exercise;Balance training;Patient/family education    PT Goals (Current goals can be found in the Care Plan section)  Acute Rehab PT Goals Patient Stated Goal: home with family support PT Goal Formulation: With patient/family Time For Goal Achievement: 07/18/22 Potential to Achieve Goals: Good    Frequency Min 5X/week     Co-evaluation               AM-PAC PT "6 Clicks" Mobility  Outcome Measure Help needed turning from your back to your side while in a flat bed without using bedrails?: A Lot Help needed moving from lying on your back to sitting on the side of a flat bed without using bedrails?: A Lot Help needed moving to and from a bed to a chair (including a wheelchair)?: Total Help needed standing up from a chair using your arms (e.g., wheelchair or bedside chair)?: Total Help needed to walk in hospital room?: Total Help needed climbing 3-5 steps with a railing? : Total 6 Click Score: 8    End of Session Equipment Utilized During Treatment: Gait belt Activity Tolerance: Patient tolerated treatment well Patient left: in bed;with call bell/phone within reach;with bed alarm set Nurse Communication: Mobility status PT Visit Diagnosis: Other abnormalities of gait and mobility (R26.89);Muscle weakness (generalized) (M62.81);Pain;Difficulty in walking, not elsewhere classified (R26.2) Pain - Right/Left: Right Pain - part of body: Hip    Time: 1456-1511 PT Time Calculation (min) (ACUTE ONLY): 15 min   Charges:   PT Evaluation $PT Eval Low Complexity: 1  Low PT Treatments $Therapeutic Exercise: 8-22 mins        Tori De Jaworski PT, DPT 07/04/22, 1:01 PM

## 2022-07-04 NOTE — Progress Notes (Signed)
[facility]                                                                              Adam Arellano, is a 89 y.o. male, DOB - 1932/01/06, LKG:[id number] Admit date - 07/01/2022    Outpatient Primary MD for the patient is [name], [name], MD  LOS - 2  days  Chief Complaint  Patient presents with   Fall       Brief summary   Patient is a 89 year old male with a history of CVA, prostate CA status post radical cystectomy, mitral valve clip, HTN, PVD, CAD, dyslipidemia, cognitive issues presented with mechanical fall and right hip pain.  Patient lives at home with his daughter and fell on his right hip while eating a boutique a day before the admission.  He was unable to stand up and was in pain.  No loss of consciousness, no chest pain, palpitations, dizziness or lightheadedness. Hip/pelvic x-ray showed acute right femoral neck fracture Patient was admitted for further workup.  Assessment & Plan    Principal Problem: Mechanical fall with closed right hip fracture ([place]) -Orthopedics consulted -Underwent bipolar hip hemiarthroplasty on the right on 07/03/2022, postop day #1 -Pain controlled, DC IV fluids -Start PT today -Pain control, DVT prophylaxis per orthopedics -Feeling constipated, added bowel regimen -Currently at home with his daughter. If requires rehab, patient's daughter requested CIR  -Will resume  aspirin and Plavix, follow H&H  Active Problems:  Acute on chronic macrocytic anemia - likely post op, hemodilution. Baseline Hb 11-12 - DC IVF, anemia panel - follow closely, transfuse for Hb <8, CBC in am  Essential hypertension -Continue Lopressor -Continue IV hydralazine as needed with parameters    PVD (peripheral vascular disease) (HCC) -Resume aspirin and Plavix today    Coronary artery disease -Currently no chest pain or shortness of breath -Continue beta-blocker.  -BP stable, continue to hold losartan -Resume aspirin and  Plavix    stage 3b chronic kidney disease (HCC) -Baseline creatinine ~1.8.  Creatinine currently at baseline -BP stable, currently holding Lasix, losartan.     H/O: CVA (cerebrovascular accident) -Currently no acute FND's, -resume Aspirin, Plavix     Asthma, chronic -Currently stable, no wheezing.    Fall from ground level -PT OT evaluation after the surgery -If patient requires rehab, patient's daughter requested CIR  Estimated body mass index is 20.97 kg/m as calculated from the following:   Height as of 06/30/22: '5\' 9"'$  (1.753 m).   Weight as of 06/30/22: 64.4 kg.  Code Status: DNR DVT Prophylaxis:  enoxaparin (LOVENOX) injection 30 mg Start: 07/02/22 1000 SCDs Start: 07/02/22 0225   Level of Care: Level of care: Telemetry Family Communication: Updated patient's daughter at the bedside on 1/28 Disposition Plan:      Remains inpatient appropriate:    Procedures:  1/29:  Bipolar hip hemiarthroplasty on the right  Consultants:   Orthopedics  Antimicrobials: None    Medications  bisacodyl  10 mg Rectal Once   docusate sodium  100 mg Oral BID   enoxaparin (LOVENOX) injection  30 mg Subcutaneous Q24H   gabapentin  100 mg Oral QHS  metoprolol tartrate  25 mg Oral BID   polyethylene glycol  17 g Oral Daily   senna-docusate  2 tablet Oral QHS      Subjective:   Adam Arellano was seen and examined today. Overall improving but feeling constipated. Right hip pain controlled.  Did not sleep much last night.  Objective:   Vitals:   07/03/22 1711 07/03/22 1740 07/03/22 2038 07/04/22 0608  BP: (!) 121/109  (!) 132/98 128/87  Pulse: (!) 127 95 94 90  Resp: '18  19 18  '$ Temp: (!) 97.5 F (36.4 C)  99.3 F (37.4 C) 97.8 F (36.6 C)  TempSrc: Oral  Oral Oral  SpO2: (!) 76% 98% 99% 95%    Intake/Output Summary (Last 24 hours) at 07/04/2022 1157 Last data filed at 07/04/2022 0934 Gross per 24 hour  Intake 2354.07 ml  Output 1500 ml  Net 854.07 ml     Wt  Readings from Last 3 Encounters:  06/30/22 64.4 kg  06/01/22 65.3 kg  05/19/22 67.6 kg    Physical Exam General: Alert and oriented x 3, NAD Cardiovascular: S1 S2 clear, RRR.  Respiratory: CTAB, no wheezing Gastrointestinal: Soft, nontender, nondistended, NBS Ext: no pedal edema bilaterally Neuro: no new deficits Psych: Normal affect     Data Reviewed:  I have personally reviewed following labs    CBC Lab Results  Component Value Date   WBC 10.6 (H) 07/04/2022   RBC 3.15 (L) 07/04/2022   HGB 9.9 (L) 07/04/2022   HCT 31.6 (L) 07/04/2022   MCV 100.3 (H) 07/04/2022   MCH 31.4 07/04/2022   PLT 200 07/04/2022   MCHC 31.3 07/04/2022   RDW 13.0 07/04/2022   LYMPHSABS 0.9 07/01/2022   MONOABS 0.6 07/01/2022   EOSABS 0.1 07/01/2022   BASOSABS 0.0 78/24/2353     Last metabolic panel Lab Results  Component Value Date   NA 135 07/04/2022   K 3.8 07/04/2022   CL 106 07/04/2022   CO2 20 (L) 07/04/2022   BUN 28 (H) 07/04/2022   CREATININE 1.73 (H) 07/04/2022   GLUCOSE 108 (H) 07/04/2022   GFRNONAA 38 (L) 07/04/2022   GFRAA 45 (L) 07/28/2020   CALCIUM 7.8 (L) 07/04/2022   PROT 5.7 (L) 04/07/2022   ALBUMIN 3.0 (L) 04/07/2022   BILITOT 0.7 04/07/2022   ALKPHOS 55 04/07/2022   AST 19 04/07/2022   ALT 16 04/07/2022   ANIONGAP 9 07/04/2022    CBG (last 3)  No results for input(s): "GLUCAP" in the last 72 hours.    Coagulation Profile: Recent Labs  Lab 07/01/22 1633 07/02/22 0502  INR 1.0 1.2     Radiology Studies: I have personally reviewed the imaging studies  DG HIP UNILAT W OR W/O PELVIS 2-3 VIEWS RIGHT  Result Date: 07/03/2022 CLINICAL DATA:  Status post right hip arthroplasty. EXAM: DG HIP (WITH OR WITHOUT PELVIS) 2-3V RIGHT COMPARISON:  Pelvis and right hip radiographs 07/01/2022 FINDINGS: There is diffuse decreased bone mineralization. Interval right bipolar hemiarthroplasty. No perihardware lucency is seen to indicate hardware failure or loosening. The  left femoroacetabular joint space is maintained. Mild superolateral left acetabular degenerative osteophytosis. The bilateral sacroiliac and pubic symphysis joint spaces are maintained. Moderate right L3-4 and L4-5 disc space narrowing and endplate osteophytosis. Numerous surgical clips overlie the pelvis. IMPRESSION: Interval right bipolar hemiarthroplasty without evidence of hardware failure. Electronically Signed   By: Yvonne Kendall M.D.   On: 07/03/2022 16:27       Denitra Donaghey M.D. [facility]  07/04/2022, 11:57 AM  Available via Epic secure chat 7am-7pm After 7 pm, please refer to night coverage provider listed on amion.

## 2022-07-04 NOTE — Progress Notes (Signed)
     Subjective: Patient reports pain as mild this morning. Lying comfortably in bed. No issues overnight. Plan for PT today.  Objective:   VITALS:   Vitals:   07/03/22 1711 07/03/22 1740 07/03/22 2038 07/04/22 0608  BP: (!) 121/109  (!) 132/98 128/87  Pulse: (!) 127 95 94 90  Resp: '18  19 18  '$ Temp: (!) 97.5 F (36.4 C)  99.3 F (37.4 C) 97.8 F (36.6 C)  TempSrc: Oral  Oral Oral  SpO2: (!) 76% 98% 99% 95%    Sensation intact distally Intact pulses distally Dorsiflexion/Plantar flexion intact Incision: dressing C/D/I Compartment soft   Lab Results  Component Value Date   WBC 10.6 (H) 07/04/2022   HGB 9.9 (L) 07/04/2022   HCT 31.6 (L) 07/04/2022   MCV 100.3 (H) 07/04/2022   PLT 200 07/04/2022   BMET    Component Value Date/Time   NA 135 07/04/2022 0442   NA 141 11/02/2021 1141   K 3.8 07/04/2022 0442   CL 106 07/04/2022 0442   CO2 20 (L) 07/04/2022 0442   GLUCOSE 108 (H) 07/04/2022 0442   BUN 28 (H) 07/04/2022 0442   BUN 34 (H) 11/02/2021 1141   CREATININE 1.73 (H) 07/04/2022 0442   CALCIUM 7.8 (L) 07/04/2022 0442   EGFR 35 (L) 11/02/2021 1141   GFRNONAA 38 (L) 07/04/2022 0442      Xray: hemi cemented components in good position no adverse features  Assessment/Plan: 1 Day Post-Op   Principal Problem:   Closed right hip fracture (HCC) Active Problems:   Hyperlipidemia   Primary hypertension   PVD (peripheral vascular disease) (HCC)   Coronary artery disease   Stage 3b chronic kidney disease (HCC)   H/O: CVA (cerebrovascular accident)   Asthma, chronic   Fall from ground level  S/p R hip hemi for femoral neck fracture 07/03/22  Post op recs: WB: WBAT with posterior hip precautions x6 weeks Abx: ancef x23 hours post op Imaging: PACU xrays Dressing: Aquacel dressing to be kept intact until follow-up DVT prophylaxis: lovenox starting POD1 x4 weeks Follow up: 2 weeks after surgery for a wound check with Dr. Zachery Dakins at [facility].  Address: [address] [place], [place], [place] [address]  Office Phone: [phone]    Willaim Sheng 07/04/2022, 6:49 AM   Charlies Constable, MD  Contact information:   [phone] 7am-5pm epic message Dr. Zachery Dakins, or call office for patient follow up: [phone] After hours and holidays please check [email or web address] for group call information for [facility]

## 2022-07-04 NOTE — TOC Initial Note (Signed)
Transition of Care [facility]) - Initial/Assessment Note   Patient Details  Name: Adam Arellano MRN: [id number] Date of Birth: April 22, 1932  [facility]) CM/SW Contact:    Sherie Don, LCSW Phone Number: 07/04/2022, 1:35 PM  Clinical Narrative: PT evaluation recommended CIR/inpatient rehab. Patient is currently being screened by CIR. TOC to follow.  Expected Discharge Plan: IP Rehab Facility Barriers to Discharge: Continued Medical Work up  Expected Discharge Plan and Services In-house Referral: Clinical Social Work Post Acute Care Choice: IP Rehab Living arrangements for the past 2 months: Single Family Home            DME Arranged: N/A DME Agency: NA  Prior Living Arrangements/Services Living arrangements for the past 2 months: [place] Patient language and need for interpreter reviewed:: Yes Need for Family Participation in Patient Care: Yes (Comment) Care giver support system in place?: Yes (comment) Criminal Activity/Legal Involvement Pertinent to Current Situation/Hospitalization: No - Comment as needed  Activities of Daily Living Home Assistive Devices/Equipment: Eyeglasses ADL Screening (condition at time of admission) Patient's cognitive ability adequate to safely complete daily activities?: Yes Is the patient deaf or have difficulty hearing?: Yes Does the patient have difficulty seeing, even when wearing glasses/contacts?: No Does the patient have difficulty concentrating, remembering, or making decisions?: Yes Patient able to express need for assistance with ADLs?: Yes Does the patient have difficulty dressing or bathing?: No Independently performs ADLs?: No Communication: Independent Dressing (OT): Needs assistance Is this a change from baseline?: Pre-admission baseline Grooming: Needs assistance Is this a change from baseline?: Pre-admission baseline Feeding: Independent Bathing: Needs assistance Is this a change from baseline?:  Pre-admission baseline Toileting: Independent In/Out Bed: Independent Walks in Home: Independent with device (comment) Does the patient have difficulty walking or climbing stairs?: Yes Weakness of Legs: Both Weakness of Arms/Hands: None  Emotional Assessment Orientation: : Oriented to Self, Oriented to Place, Oriented to  Time, Oriented to Situation  Admission diagnosis:  Closed right hip fracture ([place]) [S72.001A] Closed displaced fracture of right femoral neck ([place]) [S72.001A] Patient Active Problem List   Diagnosis Date Noted   H/O: CVA (cerebrovascular accident) 07/02/2022   Transient global amnesia 07/02/2022   Asthma, chronic 07/02/2022   H/O radical prostatectomy 07/02/2022   Fall from ground level 07/02/2022   Closed right hip fracture ([place]) 07/01/2022   Acute ischemic left posterior cerebral artery (PCA) stroke ([place]) 04/07/2022   Acute ischemic stroke ([place]) 04/02/2022   Hypokalemia 04/02/2022   Fall at home, initial encounter 04/02/2022   Mitral valve disorder 01/16/2022   Paresthesia 01/16/2022   Exudative age-related macular degeneration of left eye with active choroidal neovascularization ([place]) 11/14/2021   Advanced nonexudative age-related macular degeneration of right eye without subfoveal involvement 11/14/2021   Advanced nonexudative age-related macular degeneration of left eye without subfoveal involvement 11/14/2021   H/O mitral valve repair 08/19/2020   Prostate cancer ([place])    PVC's (premature ventricular contractions)    PVD (peripheral vascular disease) ([place])    Coronary artery disease    Nonrheumatic mitral valve regurgitation    Anemia in chronic kidney disease 04/24/2020   Stage 3b chronic kidney disease ([place]) 04/24/2020   History of malignant neoplasm of prostate 04/24/2020   Hypertensive renal disease 04/24/2020   Otorrhea of left ear 01/12/2020   Tympanic membrane perforation, left 01/12/2020   Acute non-recurrent sinusitis 08/07/2018   Tympanic  membrane perforation, marginal, right 08/07/2018   Cholesteatoma of attic of ear, left 01/23/2017   Conductive  hearing loss of both ears 01/23/2017   Laryngopharyngeal reflux (LPR) 01/23/2017   Perennial allergic rhinitis 01/23/2017   Sensorineural hearing loss (SNHL), bilateral 08/17/2015   Bilateral chronic secretory otitis media 08/09/2015   Chronic swimmer's ear of both sides 08/09/2015   History of colonic polyps 08/09/2015   Dysfunction of both eustachian tubes 07/19/2015   Primary hypertension 02/27/2014   Hyperlipidemia 06/10/2013   PCP:  Corliss Blacker, MD Pharmacy:   [place], [place] [address] Phone: [phone] Fax: [phone]  [facility] [address] [place] [address] Phone: [phone] Fax: [phone]  Social Determinants of Health (SDOH) Social History: SDOH Screenings   Food Insecurity: No Food Insecurity (07/02/2022)  Housing: Low Risk  (07/02/2022)  Transportation Needs: No Transportation Needs (07/02/2022)  Utilities: Not At Risk (07/02/2022)  Depression (PHQ2-9): Low Risk  (06/30/2022)  Recent Concern: Depression (PHQ2-9) - Medium Risk (05/19/2022)  Tobacco Use: Medium Risk (07/04/2022)   SDOH Interventions: Housing Interventions: Intervention Not Indicated  Readmission Risk Interventions    07/04/2022    1:34 PM  Readmission Risk Prevention Plan  Transportation Screening Complete  HRI or Home Care Consult Complete  SW Recovery Care/Counseling Consult Complete  Palliative Care Screening Not Applicable  Skilled Nursing Facility Complete

## 2022-07-04 NOTE — Progress Notes (Signed)
   Inpatient Rehab Admissions Coordinator : ? ?Per therapy recommendations, patient was screened for CIR candidacy by Navy Rothschild RN MSN.  At this time patient appears to be a potential candidate for CIR. I will place a rehab consult per protocol for full assessment. Please call me with any questions. ? ?Brae Schaafsma RN MSN ?Admissions Coordinator ?[phone] ?  ?

## 2022-07-05 DIAGNOSIS — N1832 Chronic kidney disease, stage 3b: Secondary | ICD-10-CM | POA: Diagnosis not present

## 2022-07-05 DIAGNOSIS — S72001A Fracture of unspecified part of neck of right femur, initial encounter for closed fracture: Secondary | ICD-10-CM | POA: Diagnosis not present

## 2022-07-05 DIAGNOSIS — I2510 Atherosclerotic heart disease of native coronary artery without angina pectoris: Secondary | ICD-10-CM | POA: Diagnosis not present

## 2022-07-05 LAB — BASIC METABOLIC PANEL
Anion gap: 12 (ref 5–15)
BUN: 30 mg/dL — ABNORMAL HIGH (ref 8–23)
CO2: 24 mmol/L (ref 22–32)
Calcium: 8.1 mg/dL — ABNORMAL LOW (ref 8.9–10.3)
Chloride: 101 mmol/L (ref 98–111)
Creatinine, Ser: 1.71 mg/dL — ABNORMAL HIGH (ref 0.61–1.24)
GFR, Estimated: 38 mL/min — ABNORMAL LOW (ref >60)
Glucose, Bld: 100 mg/dL — ABNORMAL HIGH (ref 70–99)
Potassium: 4.2 mmol/L (ref 3.5–5.1)
Sodium: 137 mmol/L (ref 135–145)

## 2022-07-05 LAB — CBC
HCT: 30.3 % — ABNORMAL LOW (ref 39.0–52.0)
Hemoglobin: 9.4 g/dL — ABNORMAL LOW (ref 13.0–17.0)
MCH: 31.4 pg (ref 26.0–34.0)
MCHC: 31 g/dL (ref 30.0–36.0)
MCV: 101.3 fL — ABNORMAL HIGH (ref 80.0–100.0)
Platelets: 212 10*3/uL (ref 150–400)
RBC: 2.99 MIL/uL — ABNORMAL LOW (ref 4.22–5.81)
RDW: 13 % (ref 11.5–15.5)
WBC: 10.1 10*3/uL (ref 4.0–10.5)
nRBC: 0 % (ref 0.0–0.2)

## 2022-07-05 MED ORDER — ALUM & MAG HYDROXIDE-SIMETH 200-200-20 MG/5ML PO SUSP
30.0000 mL | ORAL | Status: DC | PRN
  Administered 2022-07-05: 30 mL via ORAL
  Filled 2022-07-05: qty 30

## 2022-07-05 NOTE — Evaluation (Signed)
Occupational Therapy Evaluation Patient Details Name: Adam Arellano MRN: [id number] DOB: May 03, 1932 Today's Date: 07/05/2022   History of Present Illness Adam Arellano is an 89 yo male who presents after fall at home, xray showed acute R femoral neck fracture. Pt s/p Bipolar hip hemiarthroplasty on the right 1/29. PMH: CVA, prostate CA status post radical cystectomy, mitral valve clip, HTN, PVD, CAD, dyslipidemia, cognitive issues   Clinical Impression   Mr. Adam Arellano is an 89 year old who presents with posterior hip precautions, pain and decreased Rom and strength to RLE resulting in a sudden decline in functional abilities. He is needing min-mod assist to stand and min assist to take steps forward. He needs constant cues to push down on his walker to take a step and limited by pain. He needs max-total assist for LB ADLs and toileting. Patient will benefit from skilled OT services while in hospital to improve deficits and learn compensatory strategies as needed in order to return to PLOF.        Recommendations for follow up therapy are one component of a multi-disciplinary discharge planning process, led by the attending physician.  Recommendations may be updated based on patient status, additional functional criteria and insurance authorization.   Follow Up Recommendations  Acute inpatient rehab (3hours/day)     Assistance Recommended at Discharge Frequent or constant Supervision/Assistance  Patient can return home with the following A lot of help with walking and/or transfers;A lot of help with bathing/dressing/bathroom;Assistance with cooking/housework;Direct supervision/assist for medications management;Assist for transportation;Help with stairs or ramp for entrance;Direct supervision/assist for financial management    Functional Status Assessment  Patient has had a recent decline in their functional status and demonstrates the ability to make significant improvements in  function in a reasonable and predictable amount of time.  Equipment Recommendations  None recommended by OT    Recommendations for Other Services       Precautions / Restrictions Precautions Precautions: Fall;Posterior Hip Precaution Booklet Issued: Yes (comment) Precaution Comments: reviewed posterior THP Restrictions Weight Bearing Restrictions: No RLE Weight Bearing: Weight bearing as tolerated      Mobility Bed Mobility Overal bed mobility: Needs Assistance Bed Mobility: Supine to Sit     Supine to sit: Mod assist, +2 for physical assistance, +2 for safety/equipment     General bed mobility comments: assist with LEs and to elevate trunk, verbal cues to self assist; bed pad utilized to ccomplete transition to EOB    Transfers Overall transfer level: Needs assistance Equipment used: Rolling walker (2 wheels) Transfers: Sit to/from Stand Sit to Stand: Min assist, Mod assist, +2 physical assistance, +2 safety/equipment, From elevated surface           General transfer comment: min-mod A +2 to power up to standing from elevated bed, cues for posterior hip precautions      Balance Overall balance assessment: Needs assistance Sitting-balance support: No upper extremity supported, Feet supported Sitting balance-Leahy Scale: Fair     Standing balance support: Reliant on assistive device for balance, During functional activity, Bilateral upper extremity supported Standing balance-Leahy Scale: Poor Standing balance comment: reliant on UEs and external assist                           ADL either performed or assessed with clinical judgement   ADL Overall ADL's : Needs assistance/impaired Eating/Feeding: Set up   Grooming: Set up;Sitting   Upper Body Bathing: Set up;Sitting   Lower Body  Bathing: Maximal assistance;Sit to/from stand   Upper Body Dressing : Set up;Sitting   Lower Body Dressing: Total assistance;Sit to/from stand;+2 for  safety/equipment   Toilet Transfer: +2 for safety/equipment;Rolling walker (2 wheels);BSC/3in1;Moderate assistance Toilet Transfer Details (indicate cue type and reason): cues to use arms more on walker to be able to take steps Toileting- Clothing Manipulation and Hygiene: Maximal assistance;Sit to/from stand;+2 for physical assistance       Functional mobility during ADLs: Rolling walker (2 wheels);Minimal assistance;Moderate assistance;+2 for physical assistance       Vision   Vision Assessment?: No apparent visual deficits     Perception     Praxis      Pertinent Vitals/Pain Pain Assessment Pain Assessment: Faces Faces Pain Scale: Hurts even more Pain Location: R hip Pain Descriptors / Indicators: Grimacing, Guarding Pain Intervention(s): Limited activity within patient's tolerance, Monitored during session, Repositioned, Patient requesting pain meds-RN notified     Hand Dominance Right   Extremity/Trunk Assessment Upper Extremity Assessment Upper Extremity Assessment: RUE deficits/detail;LUE deficits/detail RUE Deficits / Details: Needed active assist to raise shoulder 3-/5 shoudler, elbow, wrist and hand 4+/5 strength RUE Sensation: WNL RUE Coordination: WNL LUE Deficits / Details: 3-/5 shoulder ROM and elbow, wrist and hand 5/5 LUE Sensation: WNL LUE Coordination: WNL   Lower Extremity Assessment Lower Extremity Assessment: Defer to PT evaluation   Cervical / Trunk Assessment Cervical / Trunk Assessment: Kyphotic   Communication Communication Communication: No difficulties   Cognition Arousal/Alertness: Awake/alert Behavior During Therapy: WFL for tasks assessed/performed Overall Cognitive Status: History of cognitive impairments - at baseline                                 General Comments: pt pleasant, follows commands, HOH     General Comments       Exercises     Shoulder Instructions      Home Living Family/patient expects to  be discharged to:: Private residence Living Arrangements: Children Available Help at Discharge: Family;Available 24 hours/day Type of Home: House Home Access: Stairs to enter [employer] of Steps: 5 (2 steps at front without handrails) Entrance Stairs-Rails: Right;Left Home Layout: One level     Bathroom Shower/Tub: Teacher, early years/pre: Standard Bathroom Accessibility: Yes   Home Equipment: Conservation officer, nature (2 wheels);Shower seat - built in          Prior Functioning/Environment Prior Level of Function : Independent/Modified Independent             Mobility Comments: pt furniture walking around the home, using RW as needed in the home ADLs Comments: daughter providing supv as needed with ADLs        OT Problem List: Decreased strength;Decreased range of motion;Decreased activity tolerance;Impaired balance (sitting and/or standing);Decreased knowledge of use of DME or AE;Decreased knowledge of precautions;Pain;Decreased cognition      OT Treatment/Interventions: Self-care/ADL training;Therapeutic exercise;DME and/or AE instruction;Therapeutic activities;Balance training;Patient/family education    OT Goals(Current goals can be found in the care plan section) Acute Rehab OT Goals Patient Stated Goal: did not state OT Goal Formulation: With patient Time For Goal Achievement: 07/19/22 Potential to Achieve Goals: Good  OT Frequency: Min 2X/week    Co-evaluation              AM-PAC OT "6 Clicks" Daily Activity     Outcome Measure Help from another person eating meals?: None Help from another person taking care of  personal grooming?: A Little Help from another person toileting, which includes using toliet, bedpan, or urinal?: Total Help from another person bathing (including washing, rinsing, drying)?: A Lot Help from another person to put on and taking off regular upper body clothing?: A Little Help from another person to put on and taking  off regular lower body clothing?: Total 6 Click Score: 14   End of Session Equipment Utilized During Treatment: Rolling walker (2 wheels);Gait belt Nurse Communication: Mobility status  Activity Tolerance: Patient tolerated treatment well Patient left: in chair;with call bell/phone within reach;with chair alarm set;with family/visitor present  OT Visit Diagnosis: Other abnormalities of gait and mobility (R26.89);Pain                Time: 1045-1105 OT Time Calculation (min): 20 min Charges:  OT General Charges $OT Visit: 1 Visit  Gustavo Lah, OTR/L [place]  Office [phone]   Lenward Chancellor 07/05/2022, 4:33 PM

## 2022-07-05 NOTE — Progress Notes (Signed)
  Progress Note   Patient: Adam SCHNELLER JZP:[id number] DOB: Nov 30, 1931 DOA: 07/01/2022     3 DOS: the patient was seen and examined on 07/05/2022   Brief hospital course: 89 year old male presenting with mechanical fall and right hip pain.  Underwent surgery for right femoral neck fracture.  Plan for CIR.*  Assessment and Plan: Mechanical fall with closed right hip fracture ([place]) Status post bipolar hip hemiarthroplasty on the right on 07/03/2022 Post op recs: WB: WBAT with posterior hip precautions x6 weeks Dressing: Aquacel dressing to be kept intact until follow-up DVT prophylaxis: lovenox starting POD1 x4 weeks Follow up: 2 weeks after surgery for a wound check with Dr. Z[facility] Dakins at [facility].   Acute on chronic macrocytic anemia likely post op, hemodilution. Baseline Hb 11-12 Anemia panel noted.  B12 and folate within normal limits. Hemoglobin appears to be stabilizing.   Essential hypertension Continue Lopressor   PVD (peripheral vascular disease) (HCC) Continue aspirin and Plavix      Coronary artery disease Stable, continue aspirin and Plavix    Stage 3b chronic kidney disease (HCC) Baseline creatinine ~1.8.  Creatinine currently at baseline     Subjective:  Feels ok  Physical Exam: Vitals:   07/04/22 0608 07/04/22 1310 07/04/22 2038 07/05/22 0518  BP: 128/87 139/62 (!) 155/77 131/72  Pulse: 90 (!) 53 85 72  Resp: '18 18 17 19  '$ Temp: 97.8 F (36.6 C) 97.7 F (36.5 C) 99.5 F (37.5 C) 98.6 F (37 C)  TempSrc: Oral Oral Oral Oral  SpO2: 95% 94% 96% 97%   Physical Exam Vitals reviewed.  Constitutional:      General: He is not in acute distress.    Appearance: He is not ill-appearing or toxic-appearing.  Cardiovascular:     Rate and Rhythm: Normal rate and regular rhythm.     Heart sounds: No murmur heard. Pulmonary:     Effort: Pulmonary effort is normal. No respiratory distress.     Breath sounds: No wheezing, rhonchi or rales.   Neurological:     Mental Status: He is alert.  Psychiatric:        Mood and Affect: Mood normal.        Behavior: Behavior normal.     Data Reviewed: Creatinine stable 1.71 Hgb stable 9.4  Family Communication: none  Disposition: Status is: Inpatient Remains inpatient appropriate because: hip fracture, needs rehab  Planned Discharge Destination:  CIR    Time spent: 20 minutes  Author: Murray Hodgkins, MD 07/05/2022 10:28 AM  For on call review [email or web address].

## 2022-07-05 NOTE — Progress Notes (Signed)
Physical Therapy Treatment Patient Details Name: Adam Arellano MRN: [id number] DOB: Jul 11, 1931 Today's Date: 07/05/2022   History of Present Illness Adam Arellano is an 89 yo male who presents after fall at home, xray showed acute R femoral neck fracture. Pt s/p Bipolar hip hemiarthroplasty on the right 1/29. PMH: CVA, prostate CA status post radical cystectomy, mitral valve clip, HTN, PVD, CAD, dyslipidemia, cognitive issues    PT Comments    Pt progressing toward PT goals. Able to amb short distance this am, excellent pt effort and overall improved from previous PT session. Continues to require cues for safety and adherence to THP during mobility. Will benefit from continue rehab post acute    Recommendations for follow up therapy are one component of a multi-disciplinary discharge planning process, led by the attending physician.  Recommendations may be updated based on patient status, additional functional criteria and insurance authorization.  Follow Up Recommendations  Acute inpatient rehab (3hours/day)     Assistance Recommended at Discharge Frequent or constant Supervision/Assistance  Patient can return home with the following A lot of help with bathing/dressing/bathroom;Assistance with cooking/housework;Assist for transportation;Help with stairs or ramp for entrance;Two people to help with walking and/or transfers   Equipment Recommendations  None recommended by PT    Recommendations for Other Services       Precautions / Restrictions Precautions Precautions: Fall;Posterior Hip Precaution Comments: reviewed posterior THP Restrictions Weight Bearing Restrictions: No RLE Weight Bearing: Weight bearing as tolerated     Mobility  Bed Mobility Overal bed mobility: Needs Assistance Bed Mobility: Supine to Sit     Supine to sit: Mod assist, +2 for physical assistance, +2 for safety/equipment     General bed mobility comments: assist with LEs and to elevate trunk,  verbal cues to self assist; bed pad utilized to ccomplete transition to EOB    Transfers Overall transfer level: Needs assistance Equipment used: Rolling walker (2 wheels) Transfers: Sit to/from Stand Sit to Stand: Min assist, Mod assist, +2 physical assistance, +2 safety/equipment, From elevated surface           General transfer comment: min-mod A +2 to power up to standing from elevated bed, cues for posterior hip precautions    Ambulation/Gait Ambulation/Gait assistance: Mod assist, +2 safety/equipment Gait Distance (Feet): 8 Feet Assistive device: Rolling walker (2 wheels) Gait Pattern/deviations: Step-to pattern, Antalgic       General Gait Details: multi-modal cues for use of UEs, sequence, RW position. assist to steady throughout, pt with slight posterior and L lateral lean   Stairs             Wheelchair Mobility    Modified Rankin (Stroke Patients Only)       Balance   Sitting-balance support: Feet supported Sitting balance-Leahy Scale: Fair     Standing balance support: Reliant on assistive device for balance, During functional activity, Bilateral upper extremity supported Standing balance-Leahy Scale: Poor Standing balance comment: reliant on UEs and external assist                            Cognition Arousal/Alertness: Awake/alert Behavior During Therapy: WFL for tasks assessed/performed Overall Cognitive Status: History of cognitive impairments - at baseline                                 General Comments: pt pleasant, follows commands, HOH  Exercises General Exercises - Lower Extremity Ankle Circles/Pumps: AROM, Both, 10 reps, Supine Quad Sets: AROM, Both, 5 reps    General Comments        Pertinent Vitals/Pain Pain Assessment Pain Assessment: Faces Faces Pain Scale: Hurts even more Pain Location: R hip Pain Descriptors / Indicators: Grimacing, Guarding Pain Intervention(s): Limited activity  within patient's tolerance, Monitored during session, Repositioned, Patient requesting pain meds-RN notified    Home Living                          Prior Function            PT Goals (current goals can now be found in the care plan section) Acute Rehab PT Goals Patient Stated Goal: home with family support PT Goal Formulation: With patient/family Time For Goal Achievement: 07/18/22 Potential to Achieve Goals: Good Progress towards PT goals: Progressing toward goals    Frequency    Min 5X/week      PT Plan Current plan remains appropriate    Co-evaluation              AM-PAC PT "6 Clicks" Mobility   Outcome Measure  Help needed turning from your back to your side while in a flat bed without using bedrails?: A Lot Help needed moving from lying on your back to sitting on the side of a flat bed without using bedrails?: A Lot Help needed moving to and from a bed to a chair (including a wheelchair)?: A Lot Help needed standing up from a chair using your arms (e.g., wheelchair or bedside chair)?: A Lot Help needed to walk in hospital room?: A Lot Help needed climbing 3-5 steps with a railing? : Total 6 Click Score: 11    End of Session   Activity Tolerance: Patient tolerated treatment well Patient left: in chair;with call bell/phone within reach;with family/visitor present (no alarm box in room) Nurse Communication: Mobility status PT Visit Diagnosis: Other abnormalities of gait and mobility (R26.89);Muscle weakness (generalized) (M62.81);Pain;Difficulty in walking, not elsewhere classified (R26.2) Pain - Right/Left: Right Pain - part of body: Hip     Time: 1049-1105 PT Time Calculation (min) (ACUTE ONLY): 16 min  Charges:  $Gait Training: 8-22 mins                     Baxter Flattery, PT  Acute Rehab Dept [facility]) [phone]  [facility] Weekend Pager [facility] only)  [phone]  07/05/2022    [facility] 07/05/2022, 1:45 PM

## 2022-07-05 NOTE — Hospital Course (Signed)
89 year old male presenting with mechanical fall and right hip pain.  Underwent surgery for right femoral neck fracture.  Plan for CIR.*

## 2022-07-05 NOTE — Progress Notes (Signed)
     Subjective: Lying comfortably in bed this morning.  Worked well with therapy yesterday.  No concerns..  Objective:   VITALS:   Vitals:   07/04/22 0608 07/04/22 1310 07/04/22 2038 07/05/22 0518  BP: 128/87 139/62 (!) 155/77 131/72  Pulse: 90 (!) 53 85 72  Resp: '18 18 17 19  '$ Temp: 97.8 F (36.6 C) 97.7 F (36.5 C) 99.5 F (37.5 C) 98.6 F (37 C)  TempSrc: Oral Oral Oral Oral  SpO2: 95% 94% 96% 97%    Sensation intact distally Intact pulses distally Dorsiflexion/Plantar flexion intact Incision: dressing C/D/I Compartment soft   Lab Results  Component Value Date   WBC 10.1 07/05/2022   HGB 9.4 (L) 07/05/2022   HCT 30.3 (L) 07/05/2022   MCV 101.3 (H) 07/05/2022   PLT 212 07/05/2022   BMET    Component Value Date/Time   NA 137 07/05/2022 0501   NA 141 11/02/2021 1141   K 4.2 07/05/2022 0501   CL 101 07/05/2022 0501   CO2 24 07/05/2022 0501   GLUCOSE 100 (H) 07/05/2022 0501   BUN 30 (H) 07/05/2022 0501   BUN 34 (H) 11/02/2021 1141   CREATININE 1.71 (H) 07/05/2022 0501   CALCIUM 8.1 (L) 07/05/2022 0501   EGFR 35 (L) 11/02/2021 1141   GFRNONAA 38 (L) 07/05/2022 0501      Xray: hemi cemented components in good position no adverse features  Assessment/Plan: 2 Days Post-Op   Principal Problem:   Closed right hip fracture (HCC) Active Problems:   Hyperlipidemia   Primary hypertension   PVD (peripheral vascular disease) (HCC)   Coronary artery disease   Stage 3b chronic kidney disease (HCC)   H/O: CVA (cerebrovascular accident)   Asthma, chronic   Fall from ground level  S/p R hip hemi for femoral neck fracture 07/03/22  Post op recs: WB: WBAT with posterior hip precautions x6 weeks Abx: ancef x23 hours post op Imaging: PACU xrays Dressing: Aquacel dressing to be kept intact until follow-up DVT prophylaxis: lovenox starting POD1 x4 weeks Follow up: 2 weeks after surgery for a wound check with Dr. Zachery Dakins at [facility].   Address: [address] [place], [place], [place] [address]  Office Phone: [phone]    Willaim Sheng 07/05/2022, 7:13 AM   Charlies Constable, MD  Contact information:   [phone] 7am-5pm epic message Dr. Zachery Dakins, or call office for patient follow up: [phone] After hours and holidays please check [email or web address] for group call information for [facility]

## 2022-07-05 NOTE — Progress Notes (Signed)
Inpatient Rehab Coordinator Note:  I spoke with pt's daughter, [name], over the phone to discuss CIR recommendations and goals/expectations of CIR stay.  We reviewed 3 hrs/day of therapy, physician follow up, and average length of stay 2 weeks (dependent upon progress) with goals of supervision.  They are familiar with our program from previous stay and have continued 24/7 support at home.  I discussed insurance auth process and I will send to [facility] Medicare once we have OT evaluation documented.  Will follow.    Shann Medal, PT, DPT Admissions Coordinator [phone] 07/05/22  2:04 PM

## 2022-07-06 ENCOUNTER — Encounter: Payer: Medicare Other | Admitting: Speech Pathology

## 2022-07-06 ENCOUNTER — Encounter: Payer: Medicare Other | Admitting: Occupational Therapy

## 2022-07-06 ENCOUNTER — Ambulatory Visit: Payer: Medicare Other | Admitting: Physical Therapy

## 2022-07-06 DIAGNOSIS — N1832 Chronic kidney disease, stage 3b: Secondary | ICD-10-CM | POA: Diagnosis not present

## 2022-07-06 DIAGNOSIS — S72001A Fracture of unspecified part of neck of right femur, initial encounter for closed fracture: Secondary | ICD-10-CM | POA: Diagnosis not present

## 2022-07-06 NOTE — Progress Notes (Signed)
Inpatient Rehab Admissions Coordinator:   Awaiting determination from [facility] Medicare regarding CIR prior auth request.  Will follow.   Shann Medal, PT, DPT Admissions Coordinator [phone] 07/06/22  9:44 AM

## 2022-07-06 NOTE — Care Management Important Message (Signed)
Important Message  Patient Details IM Letter given. Name: Adam Arellano MRN: [id number] Date of Birth: 1931/12/09   Medicare Important Message Given:  Yes     Kerin Salen 07/06/2022, 2:37 PM

## 2022-07-06 NOTE — Progress Notes (Signed)
     Subjective: Lying comfortably in bed this morning. Pain well controlled with just tylenol. Making good progress with mobility, doing short walks with nursing and PT with mod assistance. No concerns.   Objective:   VITALS:   Vitals:   07/04/22 2038 07/05/22 0518 07/05/22 1344 07/05/22 2045  BP: (!) 155/77 131/72 139/67 (!) 160/92  Pulse: 85 72 (!) 45 98  Resp: '17 19 16 16  '$ Temp: 99.5 F (37.5 C) 98.6 F (37 C) 98.3 F (36.8 C) 99.6 F (37.6 C)  TempSrc: Oral Oral    SpO2: 96% 97% 100% 97%    Sensation intact distally Intact pulses distally Dorsiflexion/Plantar flexion intact Incision: dressing C/D/I Compartment soft   Lab Results  Component Value Date   WBC 10.1 07/05/2022   HGB 9.4 (L) 07/05/2022   HCT 30.3 (L) 07/05/2022   MCV 101.3 (H) 07/05/2022   PLT 212 07/05/2022   BMET    Component Value Date/Time   NA 137 07/05/2022 0501   NA 141 11/02/2021 1141   K 4.2 07/05/2022 0501   CL 101 07/05/2022 0501   CO2 24 07/05/2022 0501   GLUCOSE 100 (H) 07/05/2022 0501   BUN 30 (H) 07/05/2022 0501   BUN 34 (H) 11/02/2021 1141   CREATININE 1.71 (H) 07/05/2022 0501   CALCIUM 8.1 (L) 07/05/2022 0501   EGFR 35 (L) 11/02/2021 1141   GFRNONAA 38 (L) 07/05/2022 0501      Xray: hemi cemented components in good position no adverse features  Assessment/Plan: 3 Days Post-Op   Principal Problem:   Closed right hip fracture (HCC) Active Problems:   Hyperlipidemia   Primary hypertension   PVD (peripheral vascular disease) (HCC)   Coronary artery disease   Stage 3b chronic kidney disease ([place])   H/O: CVA (cerebrovascular accident)   Asthma, chronic   Fall from ground level  S/p R hip hemi for femoral neck fracture 07/03/22  Post op recs: WB: WBAT with posterior hip precautions x6 weeks Abx: ancef x23 hours post op Imaging: PACU xrays Dressing: Aquacel dressing to be kept intact until follow-up DVT prophylaxis: lovenox starting POD1 x4 weeks Follow up: 2  weeks after surgery for a wound check with Dr. Zachery Dakins at [facility].  Address: [address] [place], [place], [place] [address]  Office Phone: [phone]    Willaim Sheng 07/06/2022, 6:10 AM   Charlies Constable, MD  Contact information:   [phone] 7am-5pm epic message Dr. Zachery Dakins, or call office for patient follow up: [phone] After hours and holidays please check [email or web address] for group call information for [facility]

## 2022-07-06 NOTE — Progress Notes (Signed)
Patient complained of indigestion on evening shift around 9:30 pm  Maalox was given and patient stated he had relief.

## 2022-07-06 NOTE — Progress Notes (Signed)
Physical Therapy Treatment Patient Details Name: Adam Arellano MRN: [id number] DOB: [birth year] Today's Date: 07/06/2022   History of Present Illness Adam Arellano is an 89 yo male who presents after fall at home, xray showed acute R femoral neck fracture. Pt s/p Bipolar hip hemiarthroplasty on the right 1/29. PMH: CVA, prostate CA status post radical cystectomy, mitral valve clip, HTN, PVD, CAD, dyslipidemia, cognitive issues    PT Comments    General Comments: AxO x 1 cooperative with impaired cognition requiring repeat instructions.  No recall any THP even after repeat instructions. Daughter present during session and very helpful.  Assisted OOB required increased time and effort.  General bed mobility comments: required Max Assist for upper body (bad B shoulders) and Max Asisst to guide R LE off bed using bed pad to complete scooting. General transfer comment: 100% VC's on proper hand placement and to avoid hip flex >90 degrees.  Despite repeat instructions, pt unable to recall THP.  Performed sit to stand 4 times all together. General Gait Details: Pt required Mod/Max Assist with daughter following behind with walker.  First amb 10 feet with 75% VC's on proper walker to self distance then required a seated rest break.  Second, amb another 8 feet.  Distance limited by fatigue/effort.  Unsteady. Returned to room in recliner then performed a few TE's followed by ICE.   Recommendations for follow up therapy are one component of a multi-disciplinary discharge planning process, led by the attending physician.  Recommendations may be updated based on patient status, additional functional criteria and insurance authorization.  Follow Up Recommendations  Acute inpatient rehab (3hours/day)     Assistance Recommended at Discharge    Patient can return home with the following A lot of help with bathing/dressing/bathroom;Assistance with cooking/housework;Assist for transportation;Help with stairs or  ramp for entrance;Two people to help with walking and/or transfers   Equipment Recommendations  None recommended by PT    Recommendations for Other Services       Precautions / Restrictions Precautions Precautions: Fall;Posterior Hip Precaution Comments: reviewed posterior THP Restrictions Weight Bearing Restrictions: No RLE Weight Bearing: Weight bearing as tolerated     Mobility  Bed Mobility Overal bed mobility: Needs Assistance Bed Mobility: Supine to Sit     Supine to sit: Max assist     General bed mobility comments: required Max Assist for upper body (bad B shoulders) and Max Asisst to guide R LE off bed using bed pad to complete scooting.    Transfers Overall transfer level: Needs assistance Equipment used: Rolling walker (2 wheels) Transfers: Sit to/from Stand Sit to Stand: Mod assist, Max assist           General transfer comment: 100% VC's on proper hand placement and to avoid hip flex >90 degrees.  Despite repeat instructions, pt unable to recall THP.  Performed sit to stand 4 times all together.    Ambulation/Gait Ambulation/Gait assistance: Mod assist, +2 safety/equipment, Max assist Gait Distance (Feet): 18 Feet Assistive device: Rolling walker (2 wheels) Gait Pattern/deviations: Step-to pattern, Antalgic, Decreased stance time - right Gait velocity: decreased     General Gait Details: Pt required Mod/Max Assist with daughter following behind with walker.  First amb 10 feet with 75% VC's on proper walker to self distance then required a seated rest break.  Second, amb another 8 feet.  Distance limited by fatigue/effort.  Unsteady.   Stairs             Emergency planning/management officer  Modified Rankin (Stroke Patients Only)       Balance                                            Cognition Arousal/Alertness: Awake/alert   Overall Cognitive Status: History of cognitive impairments - at baseline                                  General Comments: AxO x 1 cooperative with impaired cognition requiring repeat instructions.  No recall any THP even after repeat instructions.        Exercises  20 reps AP 10 reps knee presses 10 reps towel squeezes 7 reps AAROM HS 10 reps SAQ's    General Comments        Pertinent Vitals/Pain Pain Assessment Pain Assessment: 0-10 Pain Score: 4  Pain Location: R hip Pain Descriptors / Indicators: Grimacing, Guarding, Operative site guarding Pain Intervention(s): Monitored during session, Repositioned, Ice applied    Home Living                          Prior Function            PT Goals (current goals can now be found in the care plan section) Progress towards PT goals: Progressing toward goals    Frequency    Min 5X/week      PT Plan Current plan remains appropriate    Co-evaluation              AM-PAC PT "6 Clicks" Mobility   Outcome Measure  Help needed turning from your back to your side while in a flat bed without using bedrails?: A Lot Help needed moving from lying on your back to sitting on the side of a flat bed without using bedrails?: A Lot Help needed moving to and from a bed to a chair (including a wheelchair)?: A Lot Help needed standing up from a chair using your arms (e.g., wheelchair or bedside chair)?: A Lot Help needed to walk in hospital room?: A Lot Help needed climbing 3-5 steps with a railing? : Total 6 Click Score: 11    End of Session Equipment Utilized During Treatment: Gait belt Activity Tolerance: Patient tolerated treatment well Patient left: in chair;with call bell/phone within reach;with family/visitor present Nurse Communication: Mobility status PT Visit Diagnosis: Other abnormalities of gait and mobility (R26.89);Muscle weakness (generalized) (M62.81);Pain;Difficulty in walking, not elsewhere classified (R26.2) Pain - Right/Left: Right Pain - part of body: Hip     Time:  1412-1440 PT Time Calculation (min) (ACUTE ONLY): 28 min  Charges:  $Gait Training: 8-22 mins $Therapeutic Exercise: 8-22 mins                     Rica Koyanagi  PTA [place] [facility] M-F          [phone] Weekend pager [phone]

## 2022-07-06 NOTE — Progress Notes (Signed)
  Progress Note   Patient: Adam Arellano BTY:[id number] DOB: [birth year] DOA: 07/01/2022     4 DOS: the patient was seen and examined on 07/06/2022   Brief hospital course: 89 year old male presenting with mechanical fall and right hip pain.  Underwent surgery for right femoral neck fracture.  Plan for CIR.*  Assessment and Plan: Mechanical fall with closed right hip fracture ([place]) Status post bipolar hip hemiarthroplasty on the right on 07/03/2022 Post op recs: WB: WBAT with posterior hip precautions x6 weeks Dressing: Aquacel dressing to be kept intact until follow-up DVT prophylaxis: lovenox starting POD1 x4 weeks Follow up: 2 weeks after surgery for a wound check with Dr. Zachery Dakins at [facility].    Acute on chronic macrocytic anemia Likely post op, hemodilution. Baseline Hb 11-12 Anemia panel noted.  B12 and folate within normal limits.   Essential hypertension Continue Lopressor   PVD (peripheral vascular disease) (HCC) Continue aspirin and Plavix      Coronary artery disease Stable, continue aspirin and Plavix    Stage 3b chronic kidney disease (HCC) Baseline creatinine ~1.8.  Creatinine currently at baseline  Subjective:  Feels ok. Rough PT session per therapist.  Physical Exam: Vitals:   07/05/22 1344 07/05/22 2045 07/06/22 0600 07/06/22 1420  BP: 139/67 (!) 160/92 (!) 144/65 136/66  Pulse: (!) 45 98 98 88  Resp: '16 16 16 18  '$ Temp: 98.3 F (36.8 C) 99.6 F (37.6 C) 98 F (36.7 C) 98.9 F (37.2 C)  TempSrc:    Oral  SpO2: 100% 97% 99% 100%   Physical Exam Vitals reviewed.  Constitutional:      General: He is not in acute distress.    Appearance: He is not ill-appearing or toxic-appearing.  Cardiovascular:     Rate and Rhythm: Normal rate and regular rhythm.     Heart sounds: No murmur heard. Pulmonary:     Effort: No respiratory distress.     Breath sounds: No wheezing, rhonchi or rales.  Neurological:     Mental Status: He is alert.   Psychiatric:        Mood and Affect: Mood normal.        Behavior: Behavior normal.     Data Reviewed: No new data  Family Communication: daughter at bedside  Disposition: Status is: Inpatient Remains inpatient appropriate because: s/p hip fracture  Planned Discharge Destination:  CIR    Time spent: 20 minutes  Author: Murray Hodgkins, MD 07/06/2022 4:46 PM  For on call review [email or web address].

## 2022-07-07 DIAGNOSIS — N1832 Chronic kidney disease, stage 3b: Secondary | ICD-10-CM | POA: Diagnosis not present

## 2022-07-07 DIAGNOSIS — S72001A Fracture of unspecified part of neck of right femur, initial encounter for closed fracture: Secondary | ICD-10-CM | POA: Diagnosis not present

## 2022-07-07 MED ORDER — BISACODYL 10 MG RE SUPP
10.0000 mg | Freq: Once | RECTAL | Status: AC
  Administered 2022-07-07: 10 mg via RECTAL
  Filled 2022-07-07: qty 1

## 2022-07-07 NOTE — Progress Notes (Signed)
Inpatient Rehab Admissions Coordinator:   Received request from [place] representing [facility] Medicare for a peer to peer discussion regarding CIR prior auth request.  I passed along information to Dr. Sarajane Jews and Dr. Ephraim Hamburger.  Will follow for final determination.   Shann Medal, PT, DPT Admissions Coordinator [phone] 07/07/22  12:01 PM

## 2022-07-07 NOTE — Progress Notes (Signed)
  Progress Note   Patient: Adam Arellano KCM:[id number] DOB: [birth year] DOA: 07/01/2022     5 DOS: the patient was seen and examined on 07/07/2022   Brief hospital course: 89 year old male presenting with mechanical fall and right hip pain.  Underwent surgery for right femoral neck fracture.  Plan for CIR.*  Assessment and Plan: Mechanical fall with closed right hip fracture ([place]) Status post bipolar hip hemiarthroplasty on the right on 07/03/2022 Post op recs: WB: WBAT with posterior hip precautions x6 weeks Dressing: Aquacel dressing to be kept intact until follow-up DVT prophylaxis: lovenox starting POD1 x4 weeks Follow up: 2 weeks after surgery for a wound check with Dr. Zachery Dakins at [facility].    Acute on chronic macrocytic anemia Likely post op, hemodilution. Baseline Hb 11-12 Anemia panel noted.  B12 and folate within normal limits.   Essential hypertension Continue Lopressor   PVD (peripheral vascular disease) (HCC) Continue aspirin and Plavix      Coronary artery disease Stable, continue aspirin and Plavix    Stage 3b chronic kidney disease (HCC) Baseline creatinine ~1.8.  Creatinine currently at baseline  Completed peer to peer today on behalf of the patient.  Insurance       Subjective:  Feels ok  Physical Exam: Vitals:   07/06/22 1420 07/06/22 2029 07/07/22 0429 07/07/22 1336  BP: 136/66 (!) 162/77 (!) 144/83 (!) 146/70  Pulse: 88 80 73 79  Resp: '18 17 18 14  '$ Temp: 98.9 F (37.2 C) 98.5 F (36.9 C) 97.6 F (36.4 C) (!) 97.4 F (36.3 C)  TempSrc: Oral Oral Oral Oral  SpO2: 100% 98% 100% 93%   Physical Exam Vitals reviewed.  Constitutional:      General: He is not in acute distress.    Appearance: He is not ill-appearing or toxic-appearing.  Cardiovascular:     Rate and Rhythm: Normal rate and regular rhythm.     Heart sounds: No murmur heard. Pulmonary:     Effort: Pulmonary effort is normal. No respiratory distress.      Breath sounds: No wheezing, rhonchi or rales.  Neurological:     Mental Status: He is alert.  Psychiatric:        Mood and Affect: Mood normal.        Behavior: Behavior normal.   Data Reviewed: No new data  Family Communication: daughter at beside  Disposition: Status is: Inpatient Remains inpatient appropriate because: s/p hip fracture surgery  Planned Discharge Destination:  CIR?    Time spent: 35 minutes  Author: Murray Hodgkins, MD 07/07/2022 5:36 PM  For on call review [email or web address].

## 2022-07-07 NOTE — Progress Notes (Signed)
Physical Therapy Treatment Patient Details Name: Adam Arellano MRN: [id number] DOB: 1931/12/26 Today's Date: 07/07/2022   History of Present Illness Adam Arellano is an 89 yo male who presents after fall at home, xray showed acute R femoral neck fracture. Pt s/p Bipolar hip hemiarthroplasty on the right 1/29. PMH: CVA, prostate CA status post radical cystectomy, mitral valve clip, HTN, PVD, CAD, dyslipidemia, cognitive issues    PT Comments    POD #4 General Comments: AxO x 2 cooperative with impaired cognition requiring repeat instructions.  No recall any THP even after repeat instructions. Pt was unable to recall amb in the hallway yesterday with me. Can be easily redirected.  Assisted OOB required increased time.  General bed mobility comments: required Max Assist for upper body (bad B shoulders) and Max Asisst to guide R LE off bed using bed pad to complete scooting. General transfer comment: 75% VC's on proper hand placement and to avoid hip flex >90 degrees.  Despite repeat instructions, pt unable to recall THP. General Gait Details: Pt required Mod/Max Assist with daughter following behind with recliner.  First amb 14 feet with 75% VC's on proper walker to self distance then required a seated rest break.  Second, amb another 20 feet.  Distance limited by fatigue/effort.  Unsteady. Returned to room in recliner and performed a few TE's followed by ICE. Pt would benefit from an aggressive Rehab such as CIR.   Recommendations for follow up therapy are one component of a multi-disciplinary discharge planning process, led by the attending physician.  Recommendations may be updated based on patient status, additional functional criteria and insurance authorization.  Follow Up Recommendations  Acute inpatient rehab (3hours/day)     Assistance Recommended at Discharge Frequent or constant Supervision/Assistance  Patient can return home with the following A lot of help with  bathing/dressing/bathroom;Assistance with cooking/housework;Assist for transportation;Help with stairs or ramp for entrance;Two people to help with walking and/or transfers   Equipment Recommendations  None recommended by PT    Recommendations for Other Services       Precautions / Restrictions Precautions Precautions: Fall;Posterior Hip Precaution Booklet Issued: Yes (comment) (given to daughter) Precaution Comments: unable to recall THP Restrictions Weight Bearing Restrictions: No RLE Weight Bearing: Weight bearing as tolerated Other Position/Activity Restrictions: Posterior Hip Precautions     Mobility  Bed Mobility Overal bed mobility: Needs Assistance Bed Mobility: Supine to Sit     Supine to sit: Max assist     General bed mobility comments: required Max Assist for upper body (bad B shoulders) and Max Asisst to guide R LE off bed using bed pad to complete scooting.    Transfers Overall transfer level: Needs assistance Equipment used: Rolling walker (2 wheels) Transfers: Sit to/from Stand Sit to Stand: Mod assist, Max assist   Step pivot transfers: Mod assist, +2 physical assistance       General transfer comment: 75% VC's on proper hand placement and to avoid hip flex >90 degrees.  Despite repeat instructions, pt unable to recall THP.    Ambulation/Gait Ambulation/Gait assistance: Mod assist, +2 safety/equipment, Max assist Gait Distance (Feet): 24 Feet Assistive device: Rolling walker (2 wheels) Gait Pattern/deviations: Step-to pattern, Antalgic, Decreased stance time - right Gait velocity: decreased     General Gait Details: Pt required Mod/Max Assist with daughter following behind with recliner.  First amb 14 feet with 75% VC's on proper walker to self distance then required a seated rest break.  Second, amb another 20 feet.  Distance limited by fatigue/effort.  Unsteady.   Stairs             Wheelchair Mobility    Modified Rankin (Stroke  Patients Only)       Balance                                            Cognition Arousal/Alertness: Awake/alert Behavior During Therapy: WFL for tasks assessed/performed Overall Cognitive Status: History of cognitive impairments - at baseline                                 General Comments: AxO x 2 cooperative with impaired cognition requiring repeat instructions.  No recall any THP even after repeat instructions. Pt was unable to recall amb in the hallway yesterday with me.        Exercises  10 reps LAQ's 10 reps AP 10 reps knee presses 10 reps HS    General Comments        Pertinent Vitals/Pain Pain Assessment Pain Assessment: Faces Faces Pain Scale: Hurts little more Pain Location: R hip Pain Descriptors / Indicators: Grimacing, Guarding, Operative site guarding Pain Intervention(s): Monitored during session, Premedicated before session, Repositioned, Ice applied    Home Living                          Prior Function            PT Goals (current goals can now be found in the care plan section) Progress towards PT goals: Progressing toward goals    Frequency    Min 5X/week      PT Plan Current plan remains appropriate    Co-evaluation              AM-PAC PT "6 Clicks" Mobility   Outcome Measure  Help needed turning from your back to your side while in a flat bed without using bedrails?: A Lot Help needed moving from lying on your back to sitting on the side of a flat bed without using bedrails?: A Lot Help needed moving to and from a bed to a chair (including a wheelchair)?: A Lot Help needed standing up from a chair using your arms (e.g., wheelchair or bedside chair)?: A Lot Help needed to walk in hospital room?: A Lot Help needed climbing 3-5 steps with a railing? : Total 6 Click Score: 11    End of Session Equipment Utilized During Treatment: Gait belt Activity Tolerance: Patient tolerated  treatment well Patient left: in chair;with call bell/phone within reach;with family/visitor present Nurse Communication: Mobility status PT Visit Diagnosis: Other abnormalities of gait and mobility (R26.89);Muscle weakness (generalized) (M62.81);Pain;Difficulty in walking, not elsewhere classified (R26.2) Pain - Right/Left: Right Pain - part of body: Hip     Time: 6195-0932 PT Time Calculation (min) (ACUTE ONLY): 26 min  Charges:  $Gait Training: 8-22 mins $Therapeutic Exercise: 8-22 mins                     {Deric Bocock  PTA Acute  Rehabilitation Services Office M-F          [phone] Weekend pager [phone]

## 2022-07-08 DIAGNOSIS — S72001A Fracture of unspecified part of neck of right femur, initial encounter for closed fracture: Secondary | ICD-10-CM | POA: Diagnosis not present

## 2022-07-08 DIAGNOSIS — D649 Anemia, unspecified: Secondary | ICD-10-CM | POA: Diagnosis not present

## 2022-07-08 DIAGNOSIS — N1832 Chronic kidney disease, stage 3b: Secondary | ICD-10-CM | POA: Diagnosis not present

## 2022-07-08 NOTE — Progress Notes (Signed)
  Progress Note   Patient: Adam Arellano SHF:[id number] DOB: [birth year] DOA: 07/01/2022     6 DOS: the patient was seen and examined on 07/08/2022   Brief hospital course: 89 year old male presenting with mechanical fall and right hip pain.  Underwent surgery for right femoral neck fracture.  Plan for CIR.*  Assessment and Plan: Mechanical fall with closed right hip fracture ([place]) Status post bipolar hip hemiarthroplasty on the right on 07/03/2022 Post op recs: WB: WBAT with posterior hip precautions x6 weeks Dressing: Aquacel dressing to be kept intact until follow-up DVT prophylaxis: lovenox starting POD1 x4 weeks Follow up: 2 weeks after surgery for a wound check with Dr. Zachery Dakins at [facility].  Stable.   Acute on chronic macrocytic anemia Likely post op, hemodilution. Baseline Hb 11-12 Anemia panel noted.  B12 and folate within normal limits.   Essential hypertension Continue Lopressor   PVD (peripheral vascular disease) (HCC) Continue aspirin and Plavix      Coronary artery disease Stable, continue aspirin and Plavix    Stage 3b chronic kidney disease (HCC) Baseline creatinine ~1.8.  Creatinine currently at baseline   Completed peer to peer 2/2 on behalf of the patient.  Insurance denied request.  Daughter has appealed. Await response.      Subjective:  Feels ok  Physical Exam: Vitals:   07/07/22 0429 07/07/22 1336 07/07/22 2226 07/08/22 0533  BP: (!) 144/83 (!) 146/70 120/89 (!) 140/82  Pulse: 73 79 74 68  Resp: '18 14 16 18  '$ Temp: 97.6 F (36.4 C) (!) 97.4 F (36.3 C) 97.6 F (36.4 C) 97.8 F (36.6 C)  TempSrc: Oral Oral Oral Oral  SpO2: 100% 93% 99% 99%   Physical Exam Vitals reviewed.  Constitutional:      General: He is not in acute distress.    Appearance: He is not ill-appearing or toxic-appearing.  Cardiovascular:     Rate and Rhythm: Normal rate and regular rhythm.     Heart sounds: No murmur heard. Pulmonary:     Effort:  Pulmonary effort is normal. No respiratory distress.     Breath sounds: No wheezing, rhonchi or rales.  Neurological:     Mental Status: He is alert.  Psychiatric:        Mood and Affect: Mood normal.        Behavior: Behavior normal.   Data Reviewed: No new data  Family Communication: daughter at bedside  Disposition: Status is: Inpatient Remains inpatient appropriate because: s/p hip fracture surgery  Planned Discharge Destination:  CIR    Time spent: 20 minutes  Author: Murray Hodgkins, MD 07/08/2022 12:22 PM  For on call review [email or web address].

## 2022-07-08 NOTE — Progress Notes (Signed)
Physical Therapy Treatment Patient Details Name: Adam Arellano MRN: [id number] DOB: [birth year] Today's Date: 07/08/2022   History of Present Illness Adam Arellano is an 89 yo male who presents after fall at home, xray showed acute R femoral neck fracture. Pt s/p Bipolar hip hemiarthroplasty on the right 1/29. PMH: CVA, prostate CA status post radical cystectomy, mitral valve clip, HTN, PVD, CAD, dyslipidemia, cognitive issues    PT Comments    Pt very pleasant and cooperative and progressing slowly but steadily with mobility.  Pt continues to struggle with memory and comprehension of THP.   Recommendations for follow up therapy are one component of a multi-disciplinary discharge planning process, led by the attending physician.  Recommendations may be updated based on patient status, additional functional criteria and insurance authorization.  Follow Up Recommendations  Acute inpatient rehab (3hours/day)     Assistance Recommended at Discharge Frequent or constant Supervision/Assistance  Patient can return home with the following A lot of help with bathing/dressing/bathroom;Assistance with cooking/housework;Assist for transportation;Help with stairs or ramp for entrance;Two people to help with walking and/or transfers   Equipment Recommendations  None recommended by PT    Recommendations for Other Services       Precautions / Restrictions Precautions Precautions: Fall;Posterior Hip Precaution Booklet Issued: Yes (comment) Precaution Comments: unable to recall THP Restrictions Weight Bearing Restrictions: Yes RLE Weight Bearing: Weight bearing as tolerated Other Position/Activity Restrictions: Posterior Hip Precautions     Mobility  Bed Mobility Overal bed mobility: Needs Assistance Bed Mobility: Supine to Sit     Supine to sit: Max assist     General bed mobility comments: with education on how to maintain posterior hip preacutions to advance to EOB. bed pad used to  complete transition to midline    Transfers Overall transfer level: Needs assistance Equipment used: Rolling walker (2 wheels) Transfers: Sit to/from Stand Sit to Stand: Mod assist           General transfer comment: 75% VC's on proper hand placement and to avoid hip flex >90 degrees.  Despite repeat instructions, pt unable to recall THP.    Ambulation/Gait Ambulation/Gait assistance: Mod assist, +2 safety/equipment Gait Distance (Feet): 36 Feet Assistive device: Rolling walker (2 wheels) Gait Pattern/deviations: Step-to pattern, Antalgic, Decreased stance time - right Gait velocity: decreased     General Gait Details: Cues for sequence, posture, position from RW, increased UE WB with L swing through, and adherence to THP   Stairs             Wheelchair Mobility    Modified Rankin (Stroke Patients Only)       Balance Overall balance assessment: Needs assistance Sitting-balance support: No upper extremity supported, Feet supported Sitting balance-Leahy Scale: Fair     Standing balance support: Reliant on assistive device for balance, During functional activity, Bilateral upper extremity supported Standing balance-Leahy Scale: Poor Standing balance comment: reliant on UEs and external assist                            Cognition Arousal/Alertness: Awake/alert Behavior During Therapy: WFL for tasks assessed/performed Overall Cognitive Status: History of cognitive impairments - at baseline                                 General Comments: unable to recall total hip precautions, patient noted to have very poor short term memory. very plesant  and cooperative.        Exercises General Exercises - Lower Extremity Ankle Circles/Pumps: AROM, Both, Supine, 15 reps    General Comments        Pertinent Vitals/Pain Pain Assessment Pain Assessment: Faces Faces Pain Scale: Hurts even more Pain Location: R hip Pain Descriptors /  Indicators: Grimacing, Guarding, Operative site guarding Pain Intervention(s): Limited activity within patient's tolerance, Monitored during session, Premedicated before session, Ice applied    Home Living                          Prior Function            PT Goals (current goals can now be found in the care plan section) Acute Rehab PT Goals Patient Stated Goal: home with family support PT Goal Formulation: With patient/family Time For Goal Achievement: 07/18/22 Potential to Achieve Goals: Good Progress towards PT goals: Progressing toward goals    Frequency    Min 5X/week      PT Plan Current plan remains appropriate    Co-evaluation PT/OT/SLP Co-Evaluation/Treatment: Yes Reason for Co-Treatment: For patient/therapist safety;To address functional/ADL transfers PT goals addressed during session: Mobility/safety with mobility OT goals addressed during session: ADL's and self-care      AM-PAC PT "6 Clicks" Mobility   Outcome Measure  Help needed turning from your back to your side while in a flat bed without using bedrails?: A Lot Help needed moving from lying on your back to sitting on the side of a flat bed without using bedrails?: A Lot Help needed moving to and from a bed to a chair (including a wheelchair)?: A Lot Help needed standing up from a chair using your arms (e.g., wheelchair or bedside chair)?: A Lot Help needed to walk in hospital room?: A Lot Help needed climbing 3-5 steps with a railing? : Total 6 Click Score: 11    End of Session Equipment Utilized During Treatment: Gait belt Activity Tolerance: Patient tolerated treatment well;Patient limited by fatigue;Patient limited by pain Patient left: in chair;with call bell/phone within reach;Other (comment) (Left with OT in room) Nurse Communication: Mobility status PT Visit Diagnosis: Other abnormalities of gait and mobility (R26.89);Muscle weakness (generalized) (M62.81);Pain;Difficulty in  walking, not elsewhere classified (R26.2) Pain - Right/Left: Right Pain - part of body: Hip     Time: 8182-9937 PT Time Calculation (min) (ACUTE ONLY): 23 min  Charges:  $Gait Training: 8-22 mins                     Annetta South Pager [phone] Office [phone]    Amen Staszak 07/08/2022, 3:12 PM

## 2022-07-08 NOTE — Consult Note (Signed)
WOC Nurse Consult Note: Reason for Consult:Stage 2 pressure injuries to sacrum at apex of gluteal cleft. Guidance is provided via the standing skin care order set and consult is performed remotely following review of the medical record including Nursing Flow Sheet. Wound type:Pressure Pressure Injury POA: No Measurement:Per Nursing Flow Sheet on 07/07/22, two Stage 2 PI measures 2cm round x 0.1cm  Wound MOQ:HUTMLYY thickness skin loss with pink, dry wound bed, no exudate Drainage (amount, consistency, odor)  Periwound:surrounding erythema  Dressing procedure/placement/frequency:Turning and repositioning is in place, I have given guidance to minimize time in the supine position. Topical care is to be with daily cleansing followed by covering the lesion with a folded layer of xeroform (antimicrobial, nonadherent) topped with dry gauze and covered with a silicone foam and changed daily.  Heels are to be floated using Prevalon boots while in bed or chair. A pressure redistribution chair cushion is provided for use here and downstream. [place] nursing team will not follow, but will remain available to this patient, the nursing and medical teams.  Please re-consult if needed.  Thank you for inviting [place] to participate in this patient's Plan of Care.  Maudie Flakes, MSN, RN, CNS, [place], Serita Grammes, [employer], [employer] phone:  [phone]

## 2022-07-08 NOTE — Progress Notes (Signed)
Occupational Therapy Treatment Patient Details Name: Adam Arellano MRN: [id number] DOB: [birth year] Today's Date: 07/08/2022   History of present illness Adam Arellano is an 89 yo male who presents after fall at home, xray showed acute R femoral neck fracture. Pt s/p Bipolar hip hemiarthroplasty on the right 1/29. PMH: CVA, prostate CA status post radical cystectomy, mitral valve clip, HTN, PVD, CAD, dyslipidemia, cognitive issues   OT comments  Patient was able to engage in functional mobility with less assistance than previous session with noted pain and preexisting BUE ROM limiting WB through RLE. Patient continues to need external support when standing with RW to maintain standing balance. Patient would continue to benefit from skilled OT services at this time while admitted and after d/c to address noted deficits in order to improve overall safety and independence in ADLs.     Recommendations for follow up therapy are one component of a multi-disciplinary discharge planning process, led by the attending physician.  Recommendations may be updated based on patient status, additional functional criteria and insurance authorization.    Follow Up Recommendations  Acute inpatient rehab (3hours/day)     Assistance Recommended at Discharge Frequent or constant Supervision/Assistance  Patient can return home with the following  A lot of help with walking and/or transfers;A lot of help with bathing/dressing/bathroom;Assistance with cooking/housework;Direct supervision/assist for medications management;Assist for transportation;Help with stairs or ramp for entrance;Direct supervision/assist for financial management   Equipment Recommendations  None recommended by OT       Precautions / Restrictions Precautions Precautions: Fall;Posterior Hip Precaution Comments: unable to recall THP Restrictions Weight Bearing Restrictions: Yes RLE Weight Bearing: Weight bearing as tolerated        Mobility Bed Mobility Overal bed mobility: Needs Assistance Bed Mobility: Supine to Sit     Supine to sit: Max assist     General bed mobility comments: with education on how to maintain posterior hip preacutions to advance to EOB. bed pad used to complete transition to midline      Balance Overall balance assessment: Needs assistance Sitting-balance support: No upper extremity supported, Feet supported Sitting balance-Leahy Scale: Fair     Standing balance support: Reliant on assistive device for balance, During functional activity, Bilateral upper extremity supported Standing balance-Leahy Scale: Poor Standing balance comment: reliant on UEs and external assist             ADL either performed or assessed with clinical judgement   ADL Overall ADL's : Needs assistance/impaired     Grooming: Brushing hair;Supervision/safety;Oral care;Wash/dry face Grooming Details (indicate cue type and reason): with increased time with limited ROM to brush hair with comb. patient was ble to set up brushing teeth with supplied items with MI but needed cues to finish task while seated.         General ADL Comments: patient was min A +2 for functional mobility into hallway with limited ability to WB through BUE with current walker. shorter walker provided at end of session to promote ability to WB through UE to offload pressure. patient upon sitting after walking could not remember that he had any pain while walking.      Cognition Arousal/Alertness: Awake/alert Behavior During Therapy: WFL for tasks assessed/performed Overall Cognitive Status: History of cognitive impairments - at baseline         General Comments: unable to recall total hip precautions, patient noted to have very poor short term memory. very plesant and cooperative.  Pertinent Vitals/ Pain       Pain Assessment Pain Assessment: Faces Faces Pain Scale: Hurts even more Pain Location: R  hip Pain Descriptors / Indicators: Grimacing, Guarding, Operative site guarding Pain Intervention(s): Limited activity within patient's tolerance, Monitored during session, Repositioned, Premedicated before session         Frequency  Min 2X/week        Progress Toward Goals  OT Goals(current goals can now be found in the care plan section)  Progress towards OT goals: Progressing toward goals     Plan Discharge plan remains appropriate    Co-evaluation    PT/OT/SLP Co-Evaluation/Treatment: Yes Reason for Co-Treatment: To address functional/ADL transfers PT goals addressed during session: Mobility/safety with mobility OT goals addressed during session: ADL's and self-care      AM-PAC OT "6 Clicks" Daily Activity     Outcome Measure   Help from another person eating meals?: None Help from another person taking care of personal grooming?: A Little Help from another person toileting, which includes using toliet, bedpan, or urinal?: Total Help from another person bathing (including washing, rinsing, drying)?: A Lot Help from another person to put on and taking off regular upper body clothing?: A Little Help from another person to put on and taking off regular lower body clothing?: Total 6 Click Score: 14    End of Session Equipment Utilized During Treatment: Rolling walker (2 wheels);Gait belt  OT Visit Diagnosis: Other abnormalities of gait and mobility (R26.89);Pain Pain - Right/Left: Right Pain - part of body: Leg   Activity Tolerance Patient tolerated treatment well   Patient Left in chair;with call bell/phone within reach;with chair alarm set   Nurse Communication Mobility status        Time: 5465-6812 OT Time Calculation (min): 23 min  Charges: OT General Charges $OT Visit: 1 Visit OT Treatments $Self Care/Home Management : 8-22 mins  Rennie Plowman, MS Acute Rehabilitation Department Office# [phone]   Willa Rough 07/08/2022, 3:02 PM

## 2022-07-09 DIAGNOSIS — S72001A Fracture of unspecified part of neck of right femur, initial encounter for closed fracture: Secondary | ICD-10-CM | POA: Diagnosis not present

## 2022-07-09 DIAGNOSIS — I739 Peripheral vascular disease, unspecified: Secondary | ICD-10-CM | POA: Diagnosis not present

## 2022-07-09 LAB — CREATININE, SERUM
Creatinine, Ser: 1.51 mg/dL — ABNORMAL HIGH (ref 0.61–1.24)
GFR, Estimated: 44 mL/min — ABNORMAL LOW (ref >60)

## 2022-07-09 NOTE — Progress Notes (Signed)
Physical Therapy Treatment Patient Details Name: Adam Arellano MRN: [id number] DOB: Jul 08, 1931 Today's Date: 07/09/2022   History of Present Illness Adam Arellano is an 89 yo male who presents after fall at home, xray showed acute R femoral neck fracture. Pt s/p Bipolar hip hemiarthroplasty on the right 1/29. PMH: CVA, prostate CA status post radical cystectomy, mitral valve clip, HTN, PVD, CAD, dyslipidemia, cognitive issues    PT Comments    Pt continues very cooperative but requiring step by step cues for safety with mobility and adherence to THP.[name]  Pt with minimal carry over between sessions and no recall of THP.   Recommendations for follow up therapy are one component of a multi-disciplinary discharge planning process, led by the attending physician.  Recommendations may be updated based on patient status, additional functional criteria and insurance authorization.  Follow Up Recommendations  Acute inpatient rehab (3hours/day)     Assistance Recommended at Discharge Frequent or constant Supervision/Assistance  Patient can return home with the following A lot of help with bathing/dressing/bathroom;Assistance with cooking/housework;Assist for transportation;Help with stairs or ramp for entrance;Two people to help with walking and/or transfers   Equipment Recommendations  None recommended by PT    Recommendations for Other Services       Precautions / Restrictions Precautions Precautions: Fall;Posterior Hip Precaution Booklet Issued: Yes (comment) Precaution Comments: unable to recall THP Restrictions Weight Bearing Restrictions: Yes RLE Weight Bearing: Weight bearing as tolerated Other Position/Activity Restrictions: Posterior Hip Precautions     Mobility  Bed Mobility Overal bed mobility: Needs Assistance Bed Mobility: Sit to Supine     Supine to sit: Mod assist Sit to supine: Mod assist, +2 for physical assistance, +2 for safety/equipment   General bed mobility  comments: cues for sequence, to slow down for safety, use of L LE to self assist, and adherence to THP    Transfers Overall transfer level: Needs assistance Equipment used: Rolling walker (2 wheels) Transfers: Sit to/from Stand Sit to Stand: +2 physical assistance, +2 safety/equipment, From elevated surface, Min assist, Mod assist           General transfer comment: cues for LE management, use of UEs to self assist, and adherence to THP    Ambulation/Gait Ambulation/Gait assistance: Min assist, Mod assist, +2 safety/equipment Gait Distance (Feet): 28 Feet Assistive device: Rolling walker (2 wheels) Gait Pattern/deviations: Step-to pattern, Antalgic, Decreased stance time - right Gait velocity: decreased     General Gait Details: Cues for sequence, posture, position from RW, increased UE WB with L swing through, and adherence to THP   Stairs             Wheelchair Mobility    Modified Rankin (Stroke Patients Only)       Balance Overall balance assessment: Needs assistance Sitting-balance support: No upper extremity supported, Feet supported Sitting balance-Leahy Scale: Fair     Standing balance support: Reliant on assistive device for balance, During functional activity, Bilateral upper extremity supported Standing balance-Leahy Scale: Poor Standing balance comment: reliant on UEs and external assist to correct for initial posterior drift                            Cognition Arousal/Alertness: Awake/alert Behavior During Therapy: WFL for tasks assessed/performed Overall Cognitive Status: History of cognitive impairments - at baseline  General Comments: unable to recall total hip precautions, patient noted to have very poor short term memory. very plesant and cooperative.        Exercises      General Comments        Pertinent Vitals/Pain Pain Assessment Pain Assessment: Faces Faces Pain  Scale: Hurts even more Pain Descriptors / Indicators: Grimacing, Guarding, Operative site guarding Pain Intervention(s): Limited activity within patient's tolerance, Monitored during session, Premedicated before session    Home Living                          Prior Function            PT Goals (current goals can now be found in the care plan section) Acute Rehab PT Goals Patient Stated Goal: home with family support PT Goal Formulation: With patient/family Time For Goal Achievement: 07/18/22 Potential to Achieve Goals: Good Progress towards PT goals: Progressing toward goals    Frequency    Min 5X/week      PT Plan Current plan remains appropriate    Co-evaluation              AM-PAC PT "6 Clicks" Mobility   Outcome Measure  Help needed turning from your back to your side while in a flat bed without using bedrails?: A Lot Help needed moving from lying on your back to sitting on the side of a flat bed without using bedrails?: A Lot Help needed moving to and from a bed to a chair (including a wheelchair)?: A Lot Help needed standing up from a chair using your arms (e.g., wheelchair or bedside chair)?: A Lot Help needed to walk in hospital room?: A Lot Help needed climbing 3-5 steps with a railing? : Total 6 Click Score: 11    End of Session Equipment Utilized During Treatment: Gait belt Activity Tolerance: Patient tolerated treatment well;Patient limited by fatigue;Patient limited by pain Patient left: in chair;with call bell/phone within reach;Other (comment) Nurse Communication: Mobility status PT Visit Diagnosis: Other abnormalities of gait and mobility (R26.89);Muscle weakness (generalized) (M62.81);Pain;Difficulty in walking, not elsewhere classified (R26.2) Pain - Right/Left: Right Pain - part of body: Hip     Time: 6010-9323 PT Time Calculation (min) (ACUTE ONLY): 25 min  Charges:  $Gait Training: 8-22 mins $Therapeutic Exercise: 8-22  mins                     Debe Coder PT Acute Rehabilitation Services Pager [phone] Office [phone]    Chrisoula Zegarra 07/09/2022, 2:24 PM

## 2022-07-09 NOTE — TOC Progression Note (Signed)
Transition of Care [facility]) - Progression Note    Patient Details  Name: Adam Arellano MRN: [id number] Date of Birth: June 17, 1931  Transition of Care [facility]) CM/SW Contact  Henrietta Dine, RN Phone Number: 07/09/2022, 12:53 PM  Clinical Narrative:    Per MD's note, pt's dtr appealed ins denial for CIR; awaiting response.   Expected Discharge Plan: [place] Barriers to Discharge: Continued Medical Work up  Expected Discharge Plan and Services In-house Referral: Clinical Social Work   Post Acute Care Choice: IP Rehab Living arrangements for the past 2 months: Single Family Home                 DME Arranged: N/A DME Agency: NA                   Social Determinants of Health (SDOH) Interventions SDOH Screenings   Food Insecurity: No Food Insecurity (07/02/2022)  Housing: Low Risk  (07/02/2022)  Transportation Needs: No Transportation Needs (07/02/2022)  Utilities: Not At Risk (07/02/2022)  Depression (PHQ2-9): Low Risk  (06/30/2022)  Recent Concern: Depression (PHQ2-9) - Medium Risk (05/19/2022)  Tobacco Use: Medium Risk (07/04/2022)    Readmission Risk Interventions    07/04/2022    1:34 PM  Readmission Risk Prevention Plan  Transportation Screening Complete  HRI or [place] Complete  SW Recovery Care/Counseling Consult Complete  Palliative Care Screening Not Applicable  Skilled Nursing Facility Complete

## 2022-07-09 NOTE — Progress Notes (Signed)
     Subjective: Lying comfortably in bed this morning. Pain well controlled with just tylenol.  Ambulated 36 feet with physical therapy yesterday.  Reports hip pain is well-controlled.  Awaiting rehab placement.  Objective:   VITALS:   Vitals:   07/08/22 0533 07/08/22 1427 07/08/22 1959 07/09/22 0506  BP: (!) 140/82 127/68 130/65 (!) 143/72  Pulse: 68 79 79 66  Resp: '18 16 18 16  '$ Temp: 97.8 F (36.6 C) 99 F (37.2 C) 98.2 F (36.8 C) 97.8 F (36.6 C)  TempSrc: Oral Oral Oral Oral  SpO2: 99% 90% 97% 100%    Sensation intact distally Intact pulses distally Dorsiflexion/Plantar flexion intact Incision: dressing C/D/I Compartment soft   Lab Results  Component Value Date   WBC 10.1 07/05/2022   HGB 9.4 (L) 07/05/2022   HCT 30.3 (L) 07/05/2022   MCV 101.3 (H) 07/05/2022   PLT 212 07/05/2022   BMET    Component Value Date/Time   NA 137 07/05/2022 0501   NA 141 11/02/2021 1141   K 4.2 07/05/2022 0501   CL 101 07/05/2022 0501   CO2 24 07/05/2022 0501   GLUCOSE 100 (H) 07/05/2022 0501   BUN 30 (H) 07/05/2022 0501   BUN 34 (H) 11/02/2021 1141   CREATININE 1.51 (H) 07/09/2022 0352   CALCIUM 8.1 (L) 07/05/2022 0501   EGFR 35 (L) 11/02/2021 1141   GFRNONAA 44 (L) 07/09/2022 0352      Xray: hemi cemented components in good position no adverse features  Assessment/Plan: 6 Days Post-Op   Principal Problem:   Closed right hip fracture (HCC) Active Problems:   Hyperlipidemia   Primary hypertension   PVD (peripheral vascular disease) (HCC)   Coronary artery disease   Stage 3b chronic kidney disease (HCC)   H/O: CVA (cerebrovascular accident)   Asthma, chronic   Fall from ground level  S/p R hip hemi for femoral neck fracture 07/03/22  Post op recs: WB: WBAT with posterior hip precautions x6 weeks Abx: ancef x23 hours post op Imaging: PACU xrays Dressing: Aquacel dressing to be kept intact until follow-up DVT prophylaxis: lovenox starting POD1 x4  weeks Follow up: 2 weeks after surgery for a wound check with Dr. Zachery Dakins at [facility].  Address: [address], [place], [place] [address]  Office Phone: [phone]    Willaim Sheng 07/09/2022, 8:11 AM   Charlies Constable, MD  Contact information:   [phone] 7am-5pm epic message Dr. Zachery Dakins, or call office for patient follow up: [phone] After hours and holidays please check [email or web address] for group call information for [facility]

## 2022-07-09 NOTE — Progress Notes (Signed)
Physical Therapy Treatment Patient Details Name: Adam Arellano MRN: [id number] DOB: 03-09-1932 Today's Date: 07/09/2022   History of Present Illness Adam Arellano is an 89 yo male who presents after fall at home, xray showed acute R femoral neck fracture. Pt s/p Bipolar hip hemiarthroplasty on the right 1/29. PMH: CVA, prostate CA status post radical cystectomy, mitral valve clip, HTN, PVD, CAD, dyslipidemia, cognitive issues    PT Comments    Pt continues pleasantly confused and very cooperative.  Pt up to ambulate in hall this am with noted improvement in quality of movement, WB tolerance on R LE and with decreasing level of assist needed for most tasks.   Recommendations for follow up therapy are one component of a multi-disciplinary discharge planning process, led by the attending physician.  Recommendations may be updated based on patient status, additional functional criteria and insurance authorization.  Follow Up Recommendations  Acute inpatient rehab (3hours/day)     Assistance Recommended at Discharge Frequent or constant Supervision/Assistance  Patient can return home with the following A lot of help with bathing/dressing/bathroom;Assistance with cooking/housework;Assist for transportation;Help with stairs or ramp for entrance;Two people to help with walking and/or transfers   Equipment Recommendations  None recommended by PT    Recommendations for Other Services       Precautions / Restrictions Precautions Precautions: Fall;Posterior Hip Precaution Booklet Issued: Yes (comment) Precaution Comments: unable to recall THP Restrictions Weight Bearing Restrictions: Yes RLE Weight Bearing: Weight bearing as tolerated Other Position/Activity Restrictions: Posterior Hip Precautions     Mobility  Bed Mobility Overal bed mobility: Needs Assistance Bed Mobility: Supine to Sit     Supine to sit: Mod assist     General bed mobility comments: cues for sequence, to slow  down for safety, use of L LE to self assist, and adherence to THP    Transfers Overall transfer level: Needs assistance Equipment used: Rolling walker (2 wheels) Transfers: Sit to/from Stand Sit to Stand: Min assist, +2 physical assistance, +2 safety/equipment, From elevated surface           General transfer comment: cues for LE management, use of UEs to self assist, and adherence to THP    Ambulation/Gait Ambulation/Gait assistance: Min assist, Mod assist, +2 physical assistance, +2 safety/equipment Gait Distance (Feet): 36 Feet Assistive device: Rolling walker (2 wheels) Gait Pattern/deviations: Step-to pattern, Antalgic, Decreased stance time - right Gait velocity: decreased     General Gait Details: Cues for sequence, posture, position from RW, increased UE WB with L swing through, and adherence to THP   Stairs             Wheelchair Mobility    Modified Rankin (Stroke Patients Only)       Balance Overall balance assessment: Needs assistance Sitting-balance support: No upper extremity supported, Feet supported Sitting balance-Leahy Scale: Fair     Standing balance support: Reliant on assistive device for balance, During functional activity, Bilateral upper extremity supported Standing balance-Leahy Scale: Poor Standing balance comment: reliant on UEs and external assist to correct for initial posterior drift                            Cognition Arousal/Alertness: Awake/alert Behavior During Therapy: WFL for tasks assessed/performed Overall Cognitive Status: History of cognitive impairments - at baseline  General Comments: unable to recall total hip precautions, patient noted to have very poor short term memory. very plesant and cooperative.        Exercises      General Comments        Pertinent Vitals/Pain Pain Assessment Pain Assessment: Faces Faces Pain Scale: Hurts even more Pain  Descriptors / Indicators: Grimacing, Guarding, Operative site guarding Pain Intervention(s): Premedicated before session, Monitored during session, Limited activity within patient's tolerance    Home Living                          Prior Function            PT Goals (current goals can now be found in the care plan section) Acute Rehab PT Goals Patient Stated Goal: home with family support PT Goal Formulation: With patient/family Time For Goal Achievement: 07/18/22 Potential to Achieve Goals: Good Progress towards PT goals: Progressing toward goals    Frequency    Min 5X/week      PT Plan Current plan remains appropriate    Co-evaluation              AM-PAC PT "6 Clicks" Mobility   Outcome Measure  Help needed turning from your back to your side while in a flat bed without using bedrails?: A Lot Help needed moving from lying on your back to sitting on the side of a flat bed without using bedrails?: A Lot Help needed moving to and from a bed to a chair (including a wheelchair)?: A Lot Help needed standing up from a chair using your arms (e.g., wheelchair or bedside chair)?: A Lot Help needed to walk in hospital room?: A Lot Help needed climbing 3-5 steps with a railing? : Total 6 Click Score: 11    End of Session Equipment Utilized During Treatment: Gait belt Activity Tolerance: Patient tolerated treatment well;Patient limited by fatigue;Patient limited by pain Patient left: in chair;with call bell/phone within reach;Other (comment) Nurse Communication: Mobility status PT Visit Diagnosis: Other abnormalities of gait and mobility (R26.89);Muscle weakness (generalized) (M62.81);Pain;Difficulty in walking, not elsewhere classified (R26.2) Pain - Right/Left: Right Pain - part of body: Hip     Time: 1131-1150 PT Time Calculation (min) (ACUTE ONLY): 19 min  Charges:  $Gait Training: 8-22 mins                     [place] Pager [phone] Office [phone]    [place] 07/09/2022, 1:24 PM

## 2022-07-09 NOTE — Progress Notes (Signed)
  Progress Note   Patient: Adam Arellano GYJ:[id number] DOB: Dec 19, 1931 DOA: 07/01/2022     7 DOS: the patient was seen and examined on 07/09/2022   Brief hospital course: 89 year old male presenting with mechanical fall and right hip pain.  Underwent surgery for right femoral neck fracture.  Plan for CIR.*  Assessment and Plan: Mechanical fall with closed right hip fracture ([place]) Status post bipolar hip hemiarthroplasty on the right on 07/03/2022 Post op recs: WB: WBAT with posterior hip precautions x6 weeks Dressing: Aquacel dressing to be kept intact until follow-up DVT prophylaxis: lovenox starting POD1 x4 weeks Follow up: 2 weeks after surgery for a wound check with Dr. Zachery Dakins at [facility].  Stable.   Acute on chronic macrocytic anemia Likely post op, hemodilution. Baseline Hb 11-12 Anemia panel noted.  B-12 and folate within normal limits.   Essential hypertension Continue Lopressor   PVD (peripheral vascular disease) (HCC) Continue aspirin and Plavix      Coronary artery disease Stable, continue aspirin and Plavix    Stage 3b chronic kidney disease (HCC) Baseline creatinine ~1.8.  Creatinine currently at baseline   Completed peer to peer 2/2 on behalf of the patient.  Insurance denied request.  Daughter has appealed. Await response.      Subjective:  Feels fine No complaints  Physical Exam: Vitals:   07/08/22 1427 07/08/22 1959 07/09/22 0506 07/09/22 1333  BP: 127/68 130/65 (!) 143/72 139/69  Pulse: 79 79 66 81  Resp: '16 18 16 16  '$ Temp: 99 F (37.2 C) 98.2 F (36.8 C) 97.8 F (36.6 C) 98.5 F (36.9 C)  TempSrc: Oral Oral Oral Oral  SpO2: 90% 97% 100% 94%   Physical Exam Vitals reviewed.  Constitutional:      General: He is not in acute distress.    Appearance: He is not ill-appearing or toxic-appearing.  Cardiovascular:     Rate and Rhythm: Normal rate and regular rhythm.     Heart sounds: No murmur heard. Pulmonary:      Effort: Pulmonary effort is normal. No respiratory distress.     Breath sounds: No wheezing, rhonchi or rales.  Neurological:     Mental Status: He is alert.  Psychiatric:        Mood and Affect: Mood normal.        Behavior: Behavior normal.     Data Reviewed: Creatinine 1.51  Family Communication:   Disposition: Status is: Inpatient Remains inpatient appropriate because: await CIR  Planned Discharge Destination:  CIR    Time spent: 20 minutes  Author: Murray Hodgkins, MD 07/09/2022 3:13 PM  For on call review [email or web address].

## 2022-07-10 DIAGNOSIS — N1832 Chronic kidney disease, stage 3b: Secondary | ICD-10-CM | POA: Diagnosis not present

## 2022-07-10 DIAGNOSIS — S72001A Fracture of unspecified part of neck of right femur, initial encounter for closed fracture: Secondary | ICD-10-CM | POA: Diagnosis not present

## 2022-07-10 MED ORDER — POLYETHYLENE GLYCOL 3350 17 G PO PACK: 17.0000 g | PACK | Freq: Every day | ORAL | 2 refills | Status: DC

## 2022-07-10 MED ORDER — OXYCODONE HCL 5 MG PO TABS: 2.5000 mg | ORAL_TABLET | ORAL | 0 refills | Status: DC | PRN

## 2022-07-10 MED ORDER — ENOXAPARIN SODIUM 30 MG/0.3ML IJ SOSY: 30.0000 mg | PREFILLED_SYRINGE | INTRAMUSCULAR | 0 refills | Status: DC

## 2022-07-10 NOTE — Progress Notes (Addendum)
  Progress Note   Patient: Adam Arellano GYK:[id number] DOB: Mar 21, 1932 DOA: 07/01/2022     8 DOS: the patient was seen and examined on 07/10/2022   Brief hospital course: 89 year old male presenting with mechanical fall and right hip pain.  Underwent surgery for right femoral neck fracture.  Plan for CIR.*  Assessment and Plan: Mechanical fall with closed right hip fracture ([place]) Status post bipolar hip hemiarthroplasty on the right on 07/03/2022 Post op recs: WB: WBAT with posterior hip precautions x6 weeks Dressing: Aquacel dressing to be kept intact until follow-up DVT prophylaxis: lovenox starting POD1 x4 weeks Follow up: 2 weeks after surgery for a wound check with Dr. Zachery Dakins at [facility].  Stable.  Pressure injury Stage 2 of sacrum, not present on admission  Per WOC RN  Acute on chronic macrocytic anemia Likely post op, hemodilution. Baseline Hb 11-12 Anemia panel noted.  B12 and folate within normal limits.   Essential hypertension Continue Lopressor   PVD (peripheral vascular disease) (HCC) Continue aspirin and Plavix      Coronary artery disease Stable, continue aspirin and Plavix    Stage 3b chronic kidney disease (HCC) Baseline creatinine ~1.8.  Creatinine currently at baseline   Completed peer to peer 2/2 on behalf of the patient. Insurance denied request.  Daughter has appealed. Await response.  Hospitalization prolonged and care delayed by insurance company's slow response to appeal.       Subjective:  Feels fine no complaints  Physical Exam: Vitals:   07/09/22 2253 07/09/22 2300 07/10/22 0450 07/10/22 1403  BP: (!) 149/73  134/66 135/79  Pulse: 72  72 82  Resp:   16 16  Temp:   98.2 F (36.8 C) 98.8 F (37.1 C)  TempSrc:   Oral Oral  SpO2:   98% 98%  Weight:  68 kg    Height:  '5\' 9"'$  (1.753 m)     Physical Exam Vitals reviewed.  Constitutional:      General: He is not in acute distress. Cardiovascular:     Rate and  Rhythm: Normal rate and regular rhythm.     Heart sounds: No murmur heard. Pulmonary:     Effort: Pulmonary effort is normal. No respiratory distress.     Breath sounds: No wheezing, rhonchi or rales.  Neurological:     Mental Status: He is alert.  Psychiatric:        Mood and Affect: Mood normal.        Behavior: Behavior normal.     Data Reviewed: No new data  Family Communication: daughter at bedside  Disposition: Status is: Inpatient Remains inpatient appropriate because: CIR  Planned Discharge Destination:  CIR    Time spent: 20 minutes  Author: Murray Hodgkins, MD 07/10/2022 4:55 PM  For on call review [email or web address].

## 2022-07-10 NOTE — Progress Notes (Signed)
Physical Therapy Treatment Patient Details Name: Adam Arellano MRN: [id number] DOB: 1931/06/15 Today's Date: 07/10/2022   History of Present Illness Adam Arellano is an 89 yo male who presents after fall at home, xray showed acute R femoral neck fracture. Pt s/p Bipolar hip hemiarthroplasty on the right 1/29. PMH: CVA, prostate CA status post radical cystectomy, mitral valve clip, HTN, PVD, CAD, dyslipidemia, cognitive issues    PT Comments    Pt reports he is tired and ready to go back to bed. Assisted back to bed, pt overall mod assist, good participation with LE exercises. D/c paln updated to SNF (CIR/insurance denied). Pt will need continued rehab post acute.      Recommendations for follow up therapy are one component of a multi-disciplinary discharge planning process, led by the attending physician.  Recommendations may be updated based on patient status, additional functional criteria and insurance authorization.  Follow Up Recommendations  Skilled nursing-short term rehab (<3 hours/day) Can patient physically be transported by private vehicle: No   Assistance Recommended at Discharge Frequent or constant Supervision/Assistance  Patient can return home with the following A lot of help with bathing/dressing/bathroom;Assistance with cooking/housework;Assist for transportation;Help with stairs or ramp for entrance;Two people to help with walking and/or transfers   Equipment Recommendations  None recommended by PT    Recommendations for Other Services       Precautions / Restrictions Precautions Precautions: Fall;Posterior Hip Precaution Booklet Issued: Yes (comment) Precaution Comments: unable to recall THP, reviewed (memory deficits at baseline) Restrictions Weight Bearing Restrictions: Yes RLE Weight Bearing: Weight bearing as tolerated Other Position/Activity Restrictions: Posterior Hip Precautions     Mobility  Bed Mobility Overal bed mobility: Needs  Assistance Bed Mobility: Sit to Supine       Sit to supine: Min assist   General bed mobility comments: cues for sequence, assist for RLE and to maintain THP; step by step cues    Transfers Overall transfer level: Needs assistance Equipment used: Rolling walker (2 wheels) Transfers: Sit to/from Stand Sit to Stand: Mod assist   Step pivot transfers: Mod assist       General transfer comment: step by step cues for LE management, use of UEs to self assist, and adherence to THP    Ambulation/Gait               General Gait Details: pivotal steps only d/t incr pain with WBing, fatigue   Stairs             Wheelchair Mobility    Modified Rankin (Stroke Patients Only)       Balance Overall balance assessment: Needs assistance Sitting-balance support: No upper extremity supported, Feet supported Sitting balance-Leahy Scale: Fair     Standing balance support: Reliant on assistive device for balance, During functional activity, Bilateral upper extremity supported Standing balance-Leahy Scale: Poor Standing balance comment: reliant on UEs and external assist to correct for initial posterior drift                            Cognition Arousal/Alertness: Awake/alert Behavior During Therapy: WFL for tasks assessed/performed Overall Cognitive Status: History of cognitive impairments - at baseline                                 General Comments: unable to recall total hip precautions, patient noted to have very poor short term memory. very  plesant and cooperative.        Exercises General Exercises - Lower Extremity Ankle Circles/Pumps: AROM, Both, 10 reps Quad Sets: AROM, Both, 10 reps Gluteal Sets: AROM, Strengthening, Both, 10 reps Short Arc Quad: Supine, Strengthening, 10 reps, Right Hip ABduction/ADduction: Supine, Strengthening, 10 reps, AAROM, Right    General Comments        Pertinent Vitals/Pain Pain Assessment Pain  Assessment: 0-10 Faces Pain Scale: Hurts even more Pain Location: R hip Pain Descriptors / Indicators: Grimacing, Guarding, Operative site guarding Pain Intervention(s): Limited activity within patient's tolerance, Monitored during session, Repositioned    Home Living                          Prior Function            PT Goals (current goals can now be found in the care plan section) Acute Rehab PT Goals Patient Stated Goal: home with family support PT Goal Formulation: With patient/family Time For Goal Achievement: 07/18/22 Potential to Achieve Goals: Good Progress towards PT goals: Progressing toward goals    Frequency    Min 3X/week      PT Plan Discharge plan needs to be updated;Frequency needs to be updated    Co-evaluation              AM-PAC PT "6 Clicks" Mobility   Outcome Measure  Help needed turning from your back to your side while in a flat bed without using bedrails?: A Lot Help needed moving from lying on your back to sitting on the side of a flat bed without using bedrails?: A Lot Help needed moving to and from a bed to a chair (including a wheelchair)?: A Lot Help needed standing up from a chair using your arms (e.g., wheelchair or bedside chair)?: A Lot Help needed to walk in hospital room?: A Lot Help needed climbing 3-5 steps with a railing? : Total 6 Click Score: 11    End of Session Equipment Utilized During Treatment: Gait belt Activity Tolerance: Patient tolerated treatment well;Patient limited by fatigue;Patient limited by pain Patient left: with call bell/phone within reach;in bed;with bed alarm set Nurse Communication: Mobility status PT Visit Diagnosis: Other abnormalities of gait and mobility (R26.89);Muscle weakness (generalized) (M62.81);Pain;Difficulty in walking, not elsewhere classified (R26.2) Pain - Right/Left: Right Pain - part of body: Hip     Time: 0539-7673 PT Time Calculation (min) (ACUTE ONLY): 18  min  Charges:  $Therapeutic Activity: 8-22 mins                     Baxter Flattery, PT  Acute Rehab Dept [facility]) [phone]  [facility] Weekend Pager [facility] only)  [phone]  07/10/2022    [facility] 07/10/2022, 10:10 AM

## 2022-07-10 NOTE — Progress Notes (Signed)
Inpatient Rehab Admissions Coordinator:   Late entry: Received notification from [facility] that request for CIR was denied following peer to peer with Dr. Sarajane Jews on 2/2.  Daughter wanting to pursue expedited appeal so I provided her with that information.  Will follow.  Call me with questions.   Shann Medal, PT, DPT Admissions Coordinator [phone] 07/10/22  9:06 AM

## 2022-07-10 NOTE — TOC Progression Note (Signed)
Transition of Care [facility]) - Progression Note   Patient Details  Name: Adam Arellano MRN: [id number] Date of Birth: [birth year]  Transition of Care [facility]) CM/SW [place], LCSW Phone Number: 07/10/2022, 10:33 AM  Clinical Narrative: TOC awaiting outcome of appeal for denial of CIR that daughter completed.  Expected Discharge Plan: [place] Barriers to Discharge: Continued Medical Work up  Expected Discharge Plan and Services In-house Referral: Clinical Social Work Post Acute Care Choice: IP Rehab Living arrangements for the past 2 months: Single Family Home             DME Arranged: N/A DME Agency: NA  Social Determinants of Health (SDOH) Interventions SDOH Screenings   Food Insecurity: No Food Insecurity (07/02/2022)  Housing: Low Risk  (07/02/2022)  Transportation Needs: No Transportation Needs (07/02/2022)  Utilities: Not At Risk (07/02/2022)  Depression (PHQ2-9): Low Risk  (06/30/2022)  Recent Concern: Depression (PHQ2-9) - Medium Risk (05/19/2022)  Tobacco Use: Medium Risk (07/04/2022)   Readmission Risk Interventions    07/04/2022    1:34 PM  Readmission Risk Prevention Plan  Transportation Screening Complete  HRI or [place] Complete  SW Recovery Care/Counseling Consult Complete  Palliative Care Screening Not Applicable  Skilled Nursing Facility Complete

## 2022-07-10 NOTE — PMR Pre-admission (Shared)
PMR Admission Coordinator Pre-Admission Assessment  Patient: Adam Arellano is an 89 y.o., male MRN: [id number] DOB: April 27, 1932 Height: '5\' 9"'$  (175.3 cm) Weight: 68 kg  Insurance Information HMO:     PPO:      PCP:      IPA:      80/20:      OTHER:  PRIMARY: U[employer] Medicare      Policy#: ***      Subscriber: *** CM Name: ***      Phone#: ***     Fax#: *** Pre-Cert#: ***      Employer: *** Benefits:  Phone #: ***     Name: *** Eff. Date: ***     Deduct: ***      Out of Pocket Max: ***      Life Max: *** CIR: ***      SNF: *** Outpatient: ***     Co-Pay: *** Home Health: ***      Co-Pay: *** DME: ***     Co-Pay: *** Providers: *** SECONDARY:       Policy#:      Phone#:   Financial Counselor:       Phone#:   The "Data Collection Information Summary" for patients in Inpatient Rehabilitation Facilities with attached "Privacy Act [place] Records" was provided and verbally reviewed with: Patient and Family  Emergency Contact Information Contact Information     Name Relation Home Work Mobile   Shrieve,[name] Daughter [phone]         Current Medical History  Patient Admitting Diagnosis: Hip fracture   History of Present Illness: Pt is a 89 y/o male presenting to [facility] on 1/27 with a mechanical fall.  Xray showed R hip fracture.  Pt underwent R hip hemiarthroplasty on 1/29.  Post op course pain management.  Therapy evaluations completed and pt was recommended for CIR.     Patient's medical record from [facility] has been reviewed by the rehabilitation admission coordinator and physician.  Past Medical History  Past Medical History:  Diagnosis Date   Asthma    a. Childhood.   Coronary artery disease    a. NSTEMI s/p DES to dRCA 2007. b. Angina 07/2009: s/p DESx2 to mid RCA for ISR and prox RCA, EF 65%. c. Stress test 05/2013: low risk, no evidence of ischemia, EF 61%.   CVA (cerebral vascular accident) ([place])    Dyslipidemia    GERD (gastroesophageal  reflux disease)    History of kidney stones    Hypertension    Peripheral vascular disease (HCC)    Prostate cancer ([place])    a. s/p radical prostatectomy.   PVC's (premature ventricular contractions)    S/P mitral valve clip implantation 08/19/2020   s/p MitraClip implantation with a XTW by Dr. Burt Knack    Transient global amnesia    a. Adm 2009: imaging nonacute, had resolved.    Has the patient had major surgery during 100 days prior to admission? Yes  Family History   family history is not on file.  Current Medications  Current Facility-Administered Medications:    acetaminophen (TYLENOL) tablet 650 mg, 650 mg, Oral, Q6H PRN, 650 mg at 07/07/22 2138 **OR** acetaminophen (TYLENOL) suppository 650 mg, 650 mg, Rectal, Q6H PRN, Willaim Sheng, MD   alum & mag hydroxide-simeth (MAALOX/MYLANTA) 200-200-20 MG/5ML suspension 30 mL, 30 mL, Oral, Q4H PRN, Rolm Bookbinder, MD, 30 mL at 07/05/22 2056   aspirin EC tablet 81 mg, 81 mg, Oral, Daily, Rai, Ripudeep K,  MD, 81 mg at 07/10/22 0930   clopidogrel (PLAVIX) tablet 75 mg, 75 mg, Oral, Daily, Rai, Ripudeep K, MD, 75 mg at 07/10/22 0930   docusate sodium (COLACE) capsule 100 mg, 100 mg, Oral, BID, Willaim Sheng, MD, 100 mg at 07/10/22 0930   enoxaparin (LOVENOX) injection 30 mg, 30 mg, Subcutaneous, Q24H, Marchwiany, Virgina Norfolk, MD, 30 mg at 07/10/22 0930   feeding supplement (ENSURE ENLIVE / ENSURE PLUS) liquid 237 mL, 237 mL, Oral, BID BM, Rai, Ripudeep K, MD, 237 mL at 07/10/22 0933   gabapentin (NEURONTIN) capsule 100 mg, 100 mg, Oral, QHS, Willaim Sheng, MD, 100 mg at 07/09/22 2253   hydrALAZINE (APRESOLINE) injection 5 mg, 5 mg, Intravenous, Q4H PRN, Willaim Sheng, MD   HYDROmorphone (DILAUDID) injection 1 mg, 1 mg, Intravenous, Q4H PRN, Willaim Sheng, MD, 1 mg at 07/03/22 0521   metoprolol tartrate (LOPRESSOR) tablet 25 mg, 25 mg, Oral, BID, Willaim Sheng, MD, 25 mg at 07/10/22 0930    nitroGLYCERIN (NITROSTAT) SL tablet 0.4 mg, 0.4 mg, Sublingual, Q5 min PRN, Willaim Sheng, MD   ondansetron (ZOFRAN) tablet 4 mg, 4 mg, Oral, Q6H PRN **OR** ondansetron (ZOFRAN) injection 4 mg, 4 mg, Intravenous, Q6H PRN, Willaim Sheng, MD   oxyCODONE (Oxy IR/ROXICODONE) immediate release tablet 2.5-5 mg, 2.5-5 mg, Oral, Q4H PRN, Willaim Sheng, MD, 5 mg at 07/09/22 0904   polyethylene glycol (MIRALAX / GLYCOLAX) packet 17 g, 17 g, Oral, Daily, Rai, Ripudeep K, MD, 17 g at 07/10/22 0933   senna-docusate (Senokot-S) tablet 2 tablet, 2 tablet, Oral, QHS, Willaim Sheng, MD, 2 tablet at 07/09/22 2252  Patients Current Diet:  Diet Order             Diet regular Room service appropriate? Yes; Fluid consistency: Thin  Diet effective now                   Precautions / Restrictions Precautions Precautions: Fall, Posterior Hip Precaution Booklet Issued: Yes (comment) Precaution Comments: unable to recall THP, reviewed (memory deficits at baseline) Restrictions Weight Bearing Restrictions: Yes RLE Weight Bearing: Weight bearing as tolerated Other Position/Activity Restrictions: Posterior Hip Precautions   Has the patient had 2 or more falls or a fall with injury in the past year? Yes  Prior Activity Level Household: home with daughter and family who provided assist with mobility and ADLs at baseline  Prior Functional Level Self Care: Did the patient need help bathing, dressing, using the toilet or eating? Needed some help  Indoor Mobility: Did the patient need assistance with walking from room to room (with or without device)? Needed some help  Stairs: Did the patient need assistance with internal or external stairs (with or without device)? Needed some help  Functional Cognition: Did the patient need help planning regular tasks such as shopping or remembering to take medications? Dependent  Patient Information Are you of Hispanic, Latino/a,or Spanish  origin?: A. No, not of Hispanic, Latino/a, or Spanish origin What is your race?: A. White Do you need or want an interpreter to communicate with a doctor or health care staff?: 0. No  Patient's Response To:  Health Literacy and Transportation Is the patient able to respond to health literacy and transportation needs?: Yes Health Literacy - How often do you need to have someone help you when you read instructions, pamphlets, or other written material from your doctor or pharmacy?: Sometimes In the past 12 months, has lack of transportation kept  you from medical appointments or from getting medications?: No In the past 12 months, has lack of transportation kept you from meetings, work, or from getting things needed for daily living?: No  [place] / [place] Devices/Equipment: Eyeglasses Home Equipment: Conservation officer, nature (2 wheels), Shower seat - built in  Prior Device Use: Indicate devices/aids used by the patient prior to current illness, exacerbation or injury? Walker  Current Functional Level Cognition  Overall Cognitive Status: History of cognitive impairments - at baseline Orientation Level: Oriented X4 General Comments: unable to recall total hip precautions, patient noted to have very poor short term memory. very plesant and cooperative.    Extremity Assessment (includes Sensation/Coordination)  Upper Extremity Assessment: RUE deficits/detail, LUE deficits/detail RUE Deficits / Details: Needed active assist to raise shoulder 3-/5 shoudler, elbow, wrist and hand 4+/5 strength RUE Sensation: WNL RUE Coordination: WNL LUE Deficits / Details: 3-/5 shoulder ROM and elbow, wrist and hand 5/5 LUE Sensation: WNL LUE Coordination: WNL  Lower Extremity Assessment: Defer to PT evaluation RLE Deficits / Details: pt able to perform ankle pump, quad 3/5, hip abduction 2/5, hip flexion 2/-/5 RLE Sensation: WNL RLE Coordination: WNL LLE Deficits / Details: AROM  WFL, strength grossly 3+/5 LLE Sensation: WNL LLE Coordination: WNL    ADLs  Overall ADL's : Needs assistance/impaired Eating/Feeding: Set up Grooming: Brushing hair, Supervision/safety, Oral care, Wash/dry face Grooming Details (indicate cue type and reason): with increased time with limited ROM to brush hair with comb. patient was ble to set up brushing teeth with supplied items with MI but needed cues to finish task while seated. Upper Body Bathing: Set up, Sitting Lower Body Bathing: Maximal assistance, Sit to/from stand Upper Body Dressing : Set up, Sitting Lower Body Dressing: Total assistance, Sit to/from stand, +2 for safety/equipment Toilet Transfer: +2 for safety/equipment, Rolling walker (2 wheels), BSC/3in1, Moderate assistance Toilet Transfer Details (indicate cue type and reason): cues to use arms more on walker to be able to take steps Toileting- Clothing Manipulation and Hygiene: Maximal assistance, Sit to/from stand, +2 for physical assistance Functional mobility during ADLs: Rolling walker (2 wheels), Minimal assistance, Moderate assistance, +2 for physical assistance General ADL Comments: patient was min A +2 for functional mobility into hallway with limited ability to WB through BUE with current walker. shorter walker provided at end of session to promote ability to WB through UE to offload pressure. patient upon sitting after walking could not remember that he had any pain while walking.    Mobility  Overal bed mobility: Needs Assistance Bed Mobility: Sit to Supine Supine to sit: Mod assist Sit to supine: Min assist General bed mobility comments: cues for sequence, assist for RLE and to maintain THP; step by step cues    Transfers  Overall transfer level: Needs assistance Equipment used: Rolling walker (2 wheels) Transfers: Sit to/from Stand Sit to Stand: Mod assist Bed to/from chair/wheelchair/BSC transfer type:: Step pivot Step pivot transfers: Mod  assist General transfer comment: step by step cues for LE management, use of UEs to self assist, and adherence to THP    Ambulation / Gait / Stairs / Wheelchair Mobility  Ambulation/Gait Ambulation/Gait assistance: Min assist, Mod assist, +2 safety/equipment Gait Distance (Feet): 28 Feet Assistive device: Rolling walker (2 wheels) Gait Pattern/deviations: Step-to pattern, Antalgic, Decreased stance time - right General Gait Details: pivotal steps only d/t incr pain with WBing, fatigue Gait velocity: decreased    Posture / Balance Dynamic Sitting Balance Sitting balance - Comments: lean  to L in sitting, needing min A to min guard with cues Balance Overall balance assessment: Needs assistance Sitting-balance support: No upper extremity supported, Feet supported Sitting balance-Leahy Scale: Fair Sitting balance - Comments: lean to L in sitting, needing min A to min guard with cues Standing balance support: Reliant on assistive device for balance, During functional activity, Bilateral upper extremity supported Standing balance-Leahy Scale: Poor Standing balance comment: reliant on UEs and external assist to correct for initial posterior drift    Special needs/care consideration Skin surgical incision to R hip   Previous Home Environment (from acute therapy documentation) Living Arrangements: Children Available Help at Discharge: Family, Available 24 hours/day Type of Home: House Home Layout: One level Home Access: Stairs to enter Entrance Stairs-Rails: Right, Left Entrance Stairs-Number of Steps: 5 (2 steps at front without handrails) Bathroom Shower/Tub: Optometrist: Yes Home Care Services: Yes Type of Home Care Services: Home OT, Home PT, Home RN, Home RT  Discharge Living Setting Plans for Discharge Living Setting: Lives with (comment) (daughter) Type of Home at Discharge: House Discharge Home Layout: One level Discharge  Home Access: Stairs to enter Entrance Stairs-Rails: Right, Left Entrance Stairs-Number of Steps: 2-5 Discharge Bathroom Shower/Tub: Tub/shower unit Discharge Bathroom Toilet: Standard Discharge Bathroom Accessibility: Yes How Accessible: Accessible via walker Does the patient have any problems obtaining your medications?: No  Social/Family/Support Systems Anticipated Caregiver: daughter, [name] Anticipated Caregiver's Contact Information: [phone] Ability/Limitations of Caregiver: providing assist for pt at baseline Caregiver Availability: 24/7 Discharge Plan Discussed with Primary Caregiver: Yes Is Caregiver In Agreement with Plan?: Yes Does Caregiver/Family have Issues with Lodging/Transportation while Pt is in Rehab?: No  Goals Patient/Family Goal for Rehab: PT/OT min assist to supervision, SLP n/a Expected length of stay: 10-12 days Additional Information: plan to d/c back home with daughter Pt/Family Agrees to Admission and willing to participate: Yes Program Orientation Provided & Reviewed with Pt/Caregiver Including Roles  & Responsibilities: Yes  Barriers to Discharge: Insurance for SNF coverage, Home environment access/layout  Decrease burden of Care through IP rehab admission: n/a  Possible need for SNF placement upon discharge: Not anticipated.    Patient Condition: I have reviewed medical records from [facility], spoken with CM, and daughter. I discussed via phone for inpatient rehabilitation assessment.  Patient will benefit from ongoing PT and OT, can actively participate in 3 hours of therapy a day 5 days of the week, and can make measurable gains during the admission.  Patient will also benefit from the coordinated team approach during an Inpatient Acute Rehabilitation admission.  The patient will receive intensive therapy as well as Rehabilitation physician, nursing, social worker, and care management interventions.  Due to safety, skin/wound care, medication  administration, pain management, and patient education the patient requires 24 hour a day rehabilitation nursing.  The patient is currently mod/max assist with mobility and basic ADLs.  Discharge setting and therapy post discharge at home with home health is anticipated.  Patient has agreed to participate in the Acute Inpatient Rehabilitation Program and will admit pending insurance approval ***.  Preadmission Screen Completed By:  Michel Santee, 07/10/2022 10:58 AM ______________________________________________________________________   Discussed status with Dr. [name] on *** at *** and received approval for admission today.  Admission Coordinator:  Michel Santee, PT, time Marland KitchenSudie Grumbling ***   Assessment/Plan: Diagnosis: Does the need for close, 24 hr/day Medical supervision in concert with the patient's rehab needs make it unreasonable for this patient to be served in  a less intensive setting? {yes_no_potentially:3041433} Co-Morbidities requiring supervision/potential complications: *** Due to {due FH:8307460}, does the patient require 24 hr/day rehab nursing? {yes_no_potentially:3041433} Does the patient require coordinated care of a physician, rehab nurse, PT, OT, and SLP to address physical and functional deficits in the context of the above medical diagnosis(es)? {yes_no_potentially:3041433} Addressing deficits in the following areas: {deficits:3041436} Can the patient actively participate in an intensive therapy program of at least 3 hrs of therapy 5 days a week? {yes_no_potentially:3041433} The potential for patient to make measurable gains while on inpatient rehab is {potential:3041437} Anticipated functional outcomes upon discharge from inpatient rehab: {functional outcomes:304600100} PT, {functional outcomes:304600100} OT, {functional outcomes:304600100} SLP Estimated rehab length of stay to reach the above functional goals is: *** Anticipated discharge destination: {anticipated dc  setting:21604} 10. Overall Rehab/Functional Prognosis: {potential:3041437}   MD Signature: ***

## 2022-07-11 DIAGNOSIS — S72001A Fracture of unspecified part of neck of right femur, initial encounter for closed fracture: Secondary | ICD-10-CM | POA: Diagnosis not present

## 2022-07-11 NOTE — TOC Progression Note (Signed)
Transition of Care [facility]) - Progression Note   Patient Details  Name: Adam Arellano MRN: [id number] Date of Birth: 1932-02-05  Transition of Care [facility]) CM/SW [place], LCSW Phone Number: 07/11/2022, 2:47 PM  Clinical Narrative: CSW spoke with patient's daughter and was informed the appeal is still pending. Per daughter, if the appeal is denied she will take the patient home rather than going to SNF. TOC to follow.  Expected Discharge Plan: [place] Barriers to Discharge: Continued Medical Work up  Expected Discharge Plan and Services In-house Referral: Clinical Social Work Post Acute Care Choice: IP Rehab Living arrangements for the past 2 months: Single Family Home           DME Arranged: N/A DME Agency: NA  Social Determinants of Health (SDOH) Interventions SDOH Screenings   Food Insecurity: No Food Insecurity (07/02/2022)  Housing: Low Risk  (07/02/2022)  Transportation Needs: No Transportation Needs (07/02/2022)  Utilities: Not At Risk (07/02/2022)  Depression (PHQ2-9): Low Risk  (06/30/2022)  Recent Concern: Depression (PHQ2-9) - Medium Risk (05/19/2022)  Tobacco Use: Medium Risk (07/04/2022)   Readmission Risk Interventions    07/04/2022    1:34 PM  Readmission Risk Prevention Plan  Transportation Screening Complete  HRI or [place] Complete  SW Recovery Care/Counseling Consult Complete  Palliative Care Screening Not Applicable  Skilled Nursing Facility Complete

## 2022-07-11 NOTE — Progress Notes (Signed)
Physical Therapy Treatment Patient Details Name: Adam Arellano MRN: [id number] DOB: 1931-12-13 Today's Date: 07/11/2022   History of Present Illness Adam Arellano is an 89 yo male who presents after fall at home, xray showed acute R femoral neck fracture. Pt s/p Bipolar hip hemiarthroplasty on the right 1/29. PMH: CVA, prostate CA status post radical cystectomy, mitral valve clip, HTN, PVD, CAD, dyslipidemia, cognitive issues    PT Comments    POD # 8 General Comments: AxO x 2 Hx Cognitive Impairements with poor ST Memeory.  Pleasant.  Following all commands.  Requires repeat Instructions.  Unable to recall any THP. Pt was already OOB in recliner.  Daughter at bedside.  General transfer comment: 100% VC's on THP to avoid hip flex > 90 degrees with sit to stand and stand to sit.  "extend LE and keep head up".  Pt will comply each time but no recall even minutes later.  Also assisted with a toilet transfer. General Gait Details: assisted with amb to and from bathroom this session vs hallway.  Pt required 75% VC's on proper walker to self distance as he continued to push walker out too far front.  Pt required 75% VC's on upright posture as well as turn completion/safety using walker. HIGH FALL RISK.  Returned to recliner then performed a few TE's.   Per chart review, daughter pursuing Inpt Rehab.     Recommendations for follow up therapy are one component of a multi-disciplinary discharge planning process, led by the attending physician.  Recommendations may be updated based on patient status, additional functional criteria and insurance authorization.  Follow Up Recommendations  Skilled nursing-short term rehab (<3 hours/day) Can patient physically be transported by private vehicle: No   Assistance Recommended at Discharge Frequent or constant Supervision/Assistance  Patient can return home with the following A lot of help with bathing/dressing/bathroom;Assistance with cooking/housework;Assist  for transportation;Help with stairs or ramp for entrance;Two people to help with walking and/or transfers   Equipment Recommendations  None recommended by PT    Recommendations for Other Services       Precautions / Restrictions Precautions Precautions: Fall;Posterior Hip Precaution Booklet Issued: Yes (comment) Precaution Comments: unable to recall THP, reviewed (memory deficits at baseline) Restrictions Weight Bearing Restrictions: No RLE Weight Bearing: Weight bearing as tolerated Other Position/Activity Restrictions: Posterior Hip Precautions     Mobility  Bed Mobility               General bed mobility comments: Pt OOB in recliner    Transfers Overall transfer level: Needs assistance Equipment used: Rolling walker (2 wheels) Transfers: Sit to/from Stand Sit to Stand: Mod assist           General transfer comment: 100% VC's on THP to avoid hip flex > 90 degrees with sit to stand and stand to sit.  "extend LE and keep head up".  Pt will comply each time but no recall even minutes later.  Also assisted with a toilet transfer.    Ambulation/Gait Ambulation/Gait assistance: Min assist, Mod assist Gait Distance (Feet): 22 Feet (11 feet x 2) Assistive device: Rolling walker (2 wheels) Gait Pattern/deviations: Step-to pattern, Antalgic, Decreased stance time - right Gait velocity: decreased     General Gait Details: assisted with amb to and from bathroom this session vs hallway.  Pt required 75% VC's on proper walker to self distance as he continued to push walker out too far front.  Pt required 75% VC's on upright posture as well as  turn completion/safety using walker.   Stairs             Wheelchair Mobility    Modified Rankin (Stroke Patients Only)       Balance                                            Cognition Arousal/Alertness: Awake/alert   Overall Cognitive Status: History of cognitive impairments - at baseline                                  General Comments: AxO x 2 Hx Cognitive Impairements with poor ST Memeory.  Pleasant.  Following all commands.  Requires repeat Instructions.  Unable to recall any THP.        Exercises  10 reps AP 10 reps LAQ's 10 reps HS 10 reps knee presses    General Comments        Pertinent Vitals/Pain Pain Assessment Pain Assessment: Faces Faces Pain Scale: Hurts even more Pain Location: R hip Pain Descriptors / Indicators: Grimacing, Guarding, Operative site guarding Pain Intervention(s): Monitored during session, Repositioned    Home Living                          Prior Function            PT Goals (current goals can now be found in the care plan section) Progress towards PT goals: Progressing toward goals    Frequency    Min 3X/week      PT Plan Discharge plan needs to be updated;Frequency needs to be updated    Co-evaluation              AM-PAC PT "6 Clicks" Mobility   Outcome Measure  Help needed turning from your back to your side while in a flat bed without using bedrails?: A Lot Help needed moving from lying on your back to sitting on the side of a flat bed without using bedrails?: A Lot Help needed moving to and from a bed to a chair (including a wheelchair)?: A Lot Help needed standing up from a chair using your arms (e.g., wheelchair or bedside chair)?: A Lot Help needed to walk in hospital room?: A Lot Help needed climbing 3-5 steps with a railing? : Total 6 Click Score: 11    End of Session Equipment Utilized During Treatment: Gait belt Activity Tolerance: Patient tolerated treatment well;Patient limited by fatigue;Patient limited by pain Patient left: with call bell/phone within reach;in bed;with bed alarm set Nurse Communication: Mobility status PT Visit Diagnosis: Other abnormalities of gait and mobility (R26.89);Muscle weakness (generalized) (M62.81);Pain;Difficulty in walking, not elsewhere  classified (R26.2) Pain - Right/Left: Right Pain - part of body: Hip     Time: 1130-1158 PT Time Calculation (min) (ACUTE ONLY): 28 min  Charges:  $Gait Training: 8-22 mins $Therapeutic Activity: 8-22 mins                     Rica Koyanagi  PTA [place] [facility] M-F          [phone] Weekend pager [phone]

## 2022-07-11 NOTE — Progress Notes (Signed)
Occupational Therapy Treatment Patient Details Name: Adam Arellano MRN: [id number] DOB: 05/06/1932 Today's Date: 07/11/2022   History of present illness Adam Arellano is an 89 yo male who presents after fall at home, xray showed acute R femoral neck fracture. Pt s/p Bipolar hip hemiarthroplasty on the right 1/29. PMH: CVA, prostate CA status post radical cystectomy, mitral valve clip, HTN, PVD, CAD, dyslipidemia, cognitive issues   OT comments  Patient was noted to progress towards goals with ability to participate in standing balance better than eval for toileting tasks. Patient remains unaware of posterior hip precautions with consistent cues needed. Patient's discharge plan remains appropriate at this time. OT will continue to follow acutely.     Recommendations for follow up therapy are one component of a multi-disciplinary discharge planning process, led by the attending physician.  Recommendations may be updated based on patient status, additional functional criteria and insurance authorization.    Follow Up Recommendations  Acute inpatient rehab (3hours/day)     Assistance Recommended at Discharge Frequent or constant Supervision/Assistance  Patient can return home with the following  A lot of help with walking and/or transfers;A lot of help with bathing/dressing/bathroom;Assistance with cooking/housework;Direct supervision/assist for medications management;Assist for transportation;Help with stairs or ramp for entrance;Direct supervision/assist for financial management   Equipment Recommendations  None recommended by OT    Recommendations for Other Services      Precautions / Restrictions Precautions Precautions: Fall;Posterior Hip Precaution Booklet Issued: Yes (comment) Precaution Comments: unable to recall THP, reviewed (memory deficits at baseline) Restrictions Weight Bearing Restrictions: Yes RLE Weight Bearing: Weight bearing as tolerated       Mobility Bed  Mobility Overal bed mobility: Needs Assistance Bed Mobility: Sit to Supine     Supine to sit: Mod assist     General bed mobility comments: with cues for sequencing and maintaining hip precautions.        Balance Overall balance assessment: Needs assistance Sitting-balance support: No upper extremity supported, Feet supported Sitting balance-Leahy Scale: Fair     Standing balance support: Reliant on assistive device for balance, During functional activity, Bilateral upper extremity supported Standing balance-Leahy Scale: Poor Standing balance comment: needs UE support to maintain standing balance                           ADL either performed or assessed with clinical judgement   ADL Overall ADL's : Needs assistance/impaired         Toilet Transfer: Minimal assistance;Ambulation;Rolling walker (2 wheels) Toilet Transfer Details (indicate cue type and reason): with education and multimodal cues to keep RW close. patient was educated on importance of participation in side stepping to avoid obstacles. Toileting- Clothing Manipulation and Hygiene: Total assistance;Sit to/from stand Toileting - Clothing Manipulation Details (indicate cue type and reason): patient was able to maintain standing balance with wide base of support with min guard with TD for hygiene tasks. patient was noted to have had loose stool in bed and larged on in commode during session. patients nurse made aware.              Cognition Arousal/Alertness: Awake/alert Behavior During Therapy: WFL for tasks assessed/performed Overall Cognitive Status: History of cognitive impairments - at baseline   General Comments: unable to recall total hip precautions, patient noted to have very poor short term memory. very plesant and cooperative.daughter present during session as well.  Pertinent Vitals/ Pain       Pain Assessment Pain Assessment: 0-10 Faces Pain Scale: Hurts even  more Pain Location: R hip Pain Descriptors / Indicators: Grimacing, Guarding, Operative site guarding Pain Intervention(s): Limited activity within patient's tolerance, Monitored during session, Repositioned         Frequency  Min 2X/week        Progress Toward Goals  OT Goals(current goals can now be found in the care plan section)  Progress towards OT goals: Progressing toward goals     Plan Discharge plan remains appropriate       AM-PAC OT "6 Clicks" Daily Activity     Outcome Measure   Help from another person eating meals?: None Help from another person taking care of personal grooming?: A Little Help from another person toileting, which includes using toliet, bedpan, or urinal?: A Lot Help from another person bathing (including washing, rinsing, drying)?: A Lot Help from another person to put on and taking off regular upper body clothing?: A Little Help from another person to put on and taking off regular lower body clothing?: Total 6 Click Score: 15    End of Session Equipment Utilized During Treatment: Rolling walker (2 wheels);Gait belt  OT Visit Diagnosis: Other abnormalities of gait and mobility (R26.89);Pain Pain - Right/Left: Right Pain - part of body: Leg   Activity Tolerance Patient tolerated treatment well   Patient Left in chair;with call bell/phone within reach;with chair alarm set;with family/visitor present   Nurse Communication Mobility status        Time: 6811-5726 OT Time Calculation (min): 27 min  Charges: OT General Charges $OT Visit: 1 Visit OT Treatments $Self Care/Home Management : 23-37 mins  Rennie Plowman, MS Acute Rehabilitation Department Office# [phone]   Willa Rough 07/11/2022, 11:35 AM

## 2022-07-11 NOTE — Progress Notes (Signed)
  Progress Note   Patient: Adam Arellano DIY:[id number] DOB: 10/03/1931 DOA: 07/01/2022     9 DOS: the patient was seen and examined on 07/11/2022   Brief hospital course: 89 year old male presenting with mechanical fall and right hip pain.  Underwent surgery for right femoral neck fracture.  Plan for CIR.*  Assessment and Plan: Mechanical fall with closed right hip fracture ([place]) Status post bipolar hip hemiarthroplasty on the right on 07/03/2022 Post op recs: WB: WBAT with posterior hip precautions x6 weeks Dressing: Aquacel dressing to be kept intact until follow-up DVT prophylaxis: lovenox starting POD1 x4 weeks Follow up: 2 weeks after surgery for a wound check with Dr. Zachery Dakins at [facility]  Stable.   Pressure injury Stage 2 of sacrum, not present on admission  Per WOC RN   Acute on chronic macrocytic anemia Likely post op, hemodilution. Baseline Hb 11-12 Anemia panel noted.  B12 and folate within normal limits.   Essential hypertension Stable. Continue Lopressor   PVD (peripheral vascular disease) (HCC) Continue aspirin and Plavix      Coronary artery disease Stable, continue aspirin and Plavix    Stage 3b chronic kidney disease (HCC) Baseline creatinine ~1.8.  Creatinine currently at baseline   Completed peer to peer 2/2 on behalf of the patient. Insurance denied request.  Daughter has appealed. Await response.  Hospitalization prolonged and care delayed by insurance company's slow response to appeal   Remains stable for CIR.      Subjective:  Feels ok  Physical Exam: Vitals:   07/10/22 0450 07/10/22 1403 07/10/22 2148 07/11/22 0630  BP: 134/66 135/79 (!) 143/89 (!) 147/69  Pulse: 72 82 69 72  Resp: '16 16 18 17  '$ Temp: 98.2 F (36.8 C) 98.8 F (37.1 C) 98.3 F (36.8 C) 98 F (36.7 C)  TempSrc: Oral Oral Oral Oral  SpO2: 98% 98% 100% 100%  Weight:      Height:       Physical Exam Vitals reviewed.  Constitutional:      General:  He is not in acute distress.    Appearance: He is not ill-appearing or toxic-appearing.  Cardiovascular:     Rate and Rhythm: Normal rate and regular rhythm.     Heart sounds: No murmur heard. Pulmonary:     Effort: Pulmonary effort is normal. No respiratory distress.     Breath sounds: No wheezing, rhonchi or rales.  Neurological:     Mental Status: He is alert.  Psychiatric:        Mood and Affect: Mood normal.        Behavior: Behavior normal.     Data Reviewed: No new data  Family Communication: daughter at bedside  Disposition: Status is: Inpatient Remains inpatient appropriate because: awaiting CIR  Planned Discharge Destination:  CIR    Time spent: 20 minutes  Author: Murray Hodgkins, MD 07/11/2022 8:40 AM  For on call review [email or web address].

## 2022-07-12 DIAGNOSIS — Z8673 Personal history of transient ischemic attack (TIA), and cerebral infarction without residual deficits: Secondary | ICD-10-CM | POA: Diagnosis not present

## 2022-07-12 DIAGNOSIS — E782 Mixed hyperlipidemia: Secondary | ICD-10-CM | POA: Diagnosis not present

## 2022-07-12 DIAGNOSIS — S72001A Fracture of unspecified part of neck of right femur, initial encounter for closed fracture: Secondary | ICD-10-CM | POA: Diagnosis not present

## 2022-07-12 DIAGNOSIS — I2510 Atherosclerotic heart disease of native coronary artery without angina pectoris: Secondary | ICD-10-CM | POA: Diagnosis not present

## 2022-07-12 LAB — COMPREHENSIVE METABOLIC PANEL WITH GFR
ALT: 13 U/L (ref 0–44)
AST: 14 U/L — ABNORMAL LOW (ref 15–41)
Albumin: 2.6 g/dL — ABNORMAL LOW (ref 3.5–5.0)
Alkaline Phosphatase: 63 U/L (ref 38–126)
Anion gap: 7 (ref 5–15)
BUN: 33 mg/dL — ABNORMAL HIGH (ref 8–23)
CO2: 25 mmol/L (ref 22–32)
Calcium: 8.3 mg/dL — ABNORMAL LOW (ref 8.9–10.3)
Chloride: 103 mmol/L (ref 98–111)
Creatinine, Ser: 1.72 mg/dL — ABNORMAL HIGH (ref 0.61–1.24)
GFR, Estimated: 38 mL/min — ABNORMAL LOW
Glucose, Bld: 144 mg/dL — ABNORMAL HIGH (ref 70–99)
Potassium: 3.9 mmol/L (ref 3.5–5.1)
Sodium: 135 mmol/L (ref 135–145)
Total Bilirubin: 0.7 mg/dL (ref 0.3–1.2)
Total Protein: 6.2 g/dL — ABNORMAL LOW (ref 6.5–8.1)

## 2022-07-12 LAB — CBC WITH DIFFERENTIAL/PLATELET
Abs Immature Granulocytes: 0.03 10*3/uL (ref 0.00–0.07)
Basophils Absolute: 0.1 10*3/uL (ref 0.0–0.1)
Basophils Relative: 1 %
Eosinophils Absolute: 0.1 10*3/uL (ref 0.0–0.5)
Eosinophils Relative: 1 %
HCT: 33.8 % — ABNORMAL LOW (ref 39.0–52.0)
Hemoglobin: 10.4 g/dL — ABNORMAL LOW (ref 13.0–17.0)
Immature Granulocytes: 0 %
Lymphocytes Relative: 14 %
Lymphs Abs: 1.1 10*3/uL (ref 0.7–4.0)
MCH: 31.2 pg (ref 26.0–34.0)
MCHC: 30.8 g/dL (ref 30.0–36.0)
MCV: 101.5 fL — ABNORMAL HIGH (ref 80.0–100.0)
Monocytes Absolute: 0.5 10*3/uL (ref 0.1–1.0)
Monocytes Relative: 6 %
Neutro Abs: 6.4 10*3/uL (ref 1.7–7.7)
Neutrophils Relative %: 78 %
Platelets: 494 10*3/uL — ABNORMAL HIGH (ref 150–400)
RBC: 3.33 MIL/uL — ABNORMAL LOW (ref 4.22–5.81)
RDW: 12.5 % (ref 11.5–15.5)
WBC: 8.2 10*3/uL (ref 4.0–10.5)
nRBC: 0 % (ref 0.0–0.2)

## 2022-07-12 LAB — PHOSPHORUS: Phosphorus: 3.8 mg/dL (ref 2.5–4.6)

## 2022-07-12 LAB — MAGNESIUM: Magnesium: 2.4 mg/dL (ref 1.7–2.4)

## 2022-07-12 MED ORDER — CLOPIDOGREL BISULFATE 75 MG PO TABS
75.0000 mg | ORAL_TABLET | Freq: Every day | ORAL | Status: DC
  Administered 2022-07-12 – 2022-07-13 (×2): 75 mg via ORAL
  Filled 2022-07-12 (×2): qty 1

## 2022-07-12 NOTE — Progress Notes (Addendum)
PROGRESS NOTE    Adam Arellano  WSF:[id number] DOB: 05-Oct-1931 DOA: 07/01/2022 PCP: Corliss Blacker, MD   Brief Narrative:  The patient is a 89 year old male presenting with mechanical fall and right hip pain.  Underwent surgery for right femoral neck fracture.  Plan for CIR however insurance company denied authorization and peer-to-peer was denied and now family has appealed the decision and awaiting appeal process and decision from [employer].  Assessment and Plan:  Mechanical fall with closed right hip fracture ([place]) -Status post bipolar hip hemiarthroplasty on the right on 07/03/2022 -Orthopedic surgery recommended weightbearing as tolerated with posterior hip precautions for 6 weeks as well as keeping the Aquacel dressing to be kept intact until follow-up in 2 weeks after surgery for wound check -DVT prophylaxis recommended for Lovenox starting postoperative day 1 for 4 weeks and patient is to follow-up with Dr. Zachery Dakins at [facility] in 2 weeks. -PT OT recommending acute inpatient rehab and he is currently stable to discharge however his insurance denied rehab stay and peer to peer was done and now family has appealed and awaiting for the appeal decision. -Per TOC is the appeal is still denied the patient will be taken home with home health either going to SNF per the patient's daughter -Continue with acetaminophen 650 mg p.o/RC every 6 as needed for mild pain, oxycodone IR 2.5 to 5 mg p.o. every 4 as needed severe pain and moderate pain as well as IV hydromorphone 1 mg every 4 as needed for moderate-severe pain control with IV narcotics -Continue bowel regimen with MiraLAX 17 g p.o. daily as well as senna docusate 2 tabs p.o. nightly docusate 100 mg p.o. twice daily   Pressure injury Stage 2 of sacrum, not present on admission  -Per WOC RN   Acute on Chronic Macrocytic Anemia -Likely post op, hemodilution. Baseline Hb 11-12 -Hgb/Hct Trend: Recent  Labs  Lab 07/01/22 1633 07/02/22 0502 07/03/22 0407 07/04/22 0442 07/05/22 0501 07/12/22 0840  HGB 11.7* 12.7* 11.0* 9.9* 9.4* 10.4*  HCT 35.9* 39.9 34.2* 31.6* 30.3* 33.8*  MCV 95.7 97.8 99.4 100.3* 101.3* 101.5*  -Anemia Panel done and showed an iron level of 23, UIBC 222, TIBC 245, saturation of 9%, ferritin level 111, folate level 8.2 and vitamin B12 216 -Continue to monitor for signs and symptoms of bleeding; no overt bleeding noted -Repeat CBC in the a.m.   Essential Hypertension -Stable. Continue metoprolol tartrate 25 mg p.o. twice daily; his Losartan 100 mg p.o. daily and Furosemide 20 mg p.o. daily were held but will consider resuming slowly -Has IV Hydralazine 5 mg prn q4hprn for SBP>180 -Continue to Monitor BP per Protocol -Last BP reading was 141/65   PVD (Peripheral vascular disease) (HCC) -Continue Aspirin 81 mg po Daily and Clopidogrel 75 mg po Daily    Leukocytosis -Likely Reactive and Improved -WBC Trend: Recent Labs  Lab 07/01/22 1633 07/02/22 0502 07/03/22 0407 07/04/22 0442 07/05/22 0501 07/12/22 0840  WBC 10.0 14.0* 10.8* 10.6* 10.1 8.2  -Continue to Monitor and Trend and Repeat CBC in the AM    Coronary Artery Disease -Stable and denies any CP -Continue Aspirin 81 mg po Daily, NTG 0.4 mg SL q29mn PRN CP, and Metoprolol Tartrate 25 mg po BID   -Clopidogrel 75 mg po Daily had been held but will resume after discussion with Orthopedic Surgery  Hypoalbuminemia -Patient's Albumin Trend: Recent Labs  Lab 07/12/22 0840  ALBUMIN 2.6*  -Continue to Monitor and Trend and repeat CMP  in the AM  Thrombocytosis -Likely Reactive -Plt Count Trend: Recent Labs  Lab 07/01/22 1633 07/02/22 0502 07/03/22 0407 07/04/22 0442 07/05/22 0501 07/12/22 0840  PLT 255 241 207 200 212 494*  -Continue to Monitor and Trend and repeat CBC in the AM   Stage 3b chronic kidney disease (HCC) -Baseline creatinine ~1.8.  Creatinine currently around baseline  -BUN/Cr  Trend: Recent Labs  Lab 07/01/22 1633 07/02/22 0502 07/03/22 0407 07/04/22 0442 07/05/22 0501 07/09/22 0352 07/12/22 0840  BUN 38* 33* 27* 28* 30*  --  33*  CREATININE 1.87* 1.77* 1.64* 1.73* 1.71* 1.51* 1.72*  -Avoid Nephrotoxic Medications, Contrast Dyes, Hypotension and Dehydration to Ensure Adequate Renal Perfusion and will need to Renally Adjust Meds -Continue to Monitor and Trend Renal Function carefully and repeat CMP in the AM   DVT prophylaxis: enoxaparin (LOVENOX) injection 30 mg Start: 07/02/22 1000 SCDs Start: 07/02/22 0225    Code Status: DNR Family Communication: No family present at bedside   Disposition Plan:  Level of care: Med-Surg Status is: Inpatient Remains inpatient appropriate because: Currently he is medically stable for discharge to inpatient rehabilitation however his insurance authorization has been denied and currently awaiting family appeal decision from the insurance company   Consultants:  Orthopedic Surgery  CIR  Procedures:  S/p R hip hemi for femoral neck fracture 07/03/22   Antimicrobials:  Anti-infectives (From admission, onward)    Start     Dose/Rate Route Frequency Ordered Stop   07/03/22 1700  ceFAZolin (ANCEF) IVPB 2g/100 mL premix        2 g 200 mL/hr over 30 Minutes Intravenous Every 8 hours 07/03/22 1357 07/03/22 2110   07/03/22 0600  ceFAZolin (ANCEF) IVPB 2g/100 mL premix        2 g 200 mL/hr over 30 Minutes Intravenous On call to O.R. 07/02/22 2025 07/03/22 1108       Subjective: Seen and examined at bedside he is doing fairly well and had no complaints.  No nausea or vomiting and states he slept well.  Denies any other concerns or complaints at this time and remains medically stable for discharge to CIR.  Objective: Vitals:   07/11/22 0630 07/11/22 1159 07/11/22 2022 07/12/22 0432  BP: (!) 147/69 (!) 140/74 136/74 (!) 141/65  Pulse: 72 67 67 77  Resp: '17  18 18  '$ Temp: 98 F (36.7 C) (!) 97.5 F (36.4 C) 97.9 F  (36.6 C) 97.7 F (36.5 C)  TempSrc: Oral Oral Oral   SpO2: 100% 94% 100% 97%  Weight:      Height:        Intake/Output Summary (Last 24 hours) at 07/12/2022 1033 Last data filed at 07/12/2022 0277 Gross per 24 hour  Intake 420 ml  Output 900 ml  Net -480 ml   Filed Weights   07/09/22 2300  Weight: 68 kg   Examination: Physical Exam:  Constitutional: Elderly Caucasian male currently no acute distress appears calm and comfortable Respiratory: Diminished to auscultation bilaterally, no wheezing, rales, rhonchi or crackles. Normal respiratory effort and patient is not tachypenic. No accessory muscle use.  Unlabored breathing Cardiovascular: RRR, no murmurs / rubs / gallops. S1 and S2 auscultated.  Trace extremity edema Abdomen: Soft, non-tender, non-distended.  Bowel sounds positive.  GU: Deferred. Musculoskeletal: No clubbing / cyanosis of digits/nails. No joint deformity upper and lower extremities.  Skin: No rashes, lesions, ulcers limited skin evaluation and incision is covered with Aquacel dressing. No induration; Warm and dry.  Neurologic: CN  2-12 grossly intact with no focal deficits. . Romberg sign cerebellar reflexes not assessed.  Psychiatric: Normal judgment and insight. Alert and awake and has a normal mood and affect.  Data Reviewed: I have personally reviewed following labs and imaging studies  CBC: Recent Labs  Lab 07/12/22 0840  WBC 8.2  NEUTROABS 6.4  HGB 10.4*  HCT 33.8*  MCV 101.5*  PLT 778*   Basic Metabolic Panel: Recent Labs  Lab 07/09/22 0352 07/12/22 0840  NA  --  135  K  --  3.9  CL  --  103  CO2  --  25  GLUCOSE  --  144*  BUN  --  33*  CREATININE 1.51* 1.72*  CALCIUM  --  8.3*  MG  --  2.4  PHOS  --  3.8   GFR: Estimated Creatinine Clearance: 28.6 mL/min (A) (by C-G formula based on SCr of 1.72 mg/dL (H)). Liver Function Tests: Recent Labs  Lab 07/12/22 0840  AST 14*  ALT 13  ALKPHOS 63  BILITOT 0.7  PROT 6.2*  ALBUMIN 2.6*    No results for input(s): "LIPASE", "AMYLASE" in the last 168 hours. No results for input(s): "AMMONIA" in the last 168 hours. Coagulation Profile: No results for input(s): "INR", "PROTIME" in the last 168 hours. Cardiac Enzymes: No results for input(s): "CKTOTAL", "CKMB", "CKMBINDEX", "TROPONINI" in the last 168 hours. BNP (last 3 results) Recent Labs    11/02/21 1141  PROBNP 1,085*   HbA1C: No results for input(s): "HGBA1C" in the last 72 hours. CBG: No results for input(s): "GLUCAP" in the last 168 hours. Lipid Profile: No results for input(s): "CHOL", "HDL", "LDLCALC", "TRIG", "CHOLHDL", "LDLDIRECT" in the last 72 hours. Thyroid Function Tests: No results for input(s): "TSH", "T4TOTAL", "FREET4", "T3FREE", "THYROIDAB" in the last 72 hours. Anemia Panel: No results for input(s): "VITAMINB12", "FOLATE", "FERRITIN", "TIBC", "IRON", "RETICCTPCT" in the last 72 hours. Sepsis Labs: No results for input(s): "PROCALCITON", "LATICACIDVEN" in the last 168 hours.  Recent Results (from the past 240 hour(s))  Surgical PCR screen     Status: None   Collection Time: 07/02/22  8:42 PM   Specimen: Nasal Mucosa; Nasal Swab  Result Value Ref Range Status   MRSA, PCR NEGATIVE NEGATIVE Final   Staphylococcus aureus NEGATIVE NEGATIVE Final    Comment: (NOTE) The Xpert SA Assay (FDA approved for NASAL specimens in patients 10 years of age and older), is one component of a comprehensive surveillance program. It is not intended to diagnose infection nor to guide or monitor treatment. Performed at [facility], [place] [address] [place], [place] [address]     Radiology Studies: No results found.  Scheduled Meds:  aspirin EC  81 mg Oral Daily   clopidogrel  75 mg Oral Daily   docusate sodium  100 mg Oral BID   enoxaparin (LOVENOX) injection  30 mg Subcutaneous Q24H   feeding supplement  237 mL Oral BID BM   gabapentin  100 mg Oral QHS   metoprolol tartrate  25 mg Oral  BID   polyethylene glycol  17 g Oral Daily   senna-docusate  2 tablet Oral QHS   Continuous Infusions:   LOS: 10 days   Raiford Noble, DO [facility] Available via Epic secure chat 7am-7pm After these hours, please refer to coverage provider listed on [email or web address] 07/12/2022, 10:33 AM

## 2022-07-12 NOTE — Care Management Important Message (Signed)
Important Message  Patient Details IM Letter given. Name: Adam Arellano MRN: [id number] Date of Birth: 03-Oct-1931   Medicare Important Message Given:  Yes     Kerin Salen 07/12/2022, 11:17 AM

## 2022-07-12 NOTE — Progress Notes (Signed)
Physical Therapy Treatment Patient Details Name: DAKOTAH ORREGO MRN: [id number] DOB: [birth year] Today's Date: 07/12/2022   History of Present Illness Seymore Brodowski is an 89 yo male who presents after fall at home, xray showed acute R femoral neck fracture. Pt s/p Bipolar hip hemiarthroplasty on the right 1/29. PMH: CVA, prostate CA status post radical cystectomy, mitral valve clip, HTN, PVD, CAD, dyslipidemia, cognitive issues    PT Comments    POD # 9 General Comments: AxO x 2 Hx Cognitive Impairements with poor ST Memeory.  Pleasant.  Following all commands.  Requires repeat Instructions.  Unable to recall any THP. "Seabron Spates take some time (to heal)" stated pt. Assisted OOB to amb in hallway required much assist.  General transfer comment: 100% VC's on THP to avoid hip flex > 90 degrees with sit to stand and stand to sit.  "extend LE and keep head up".  Pt will comply each time but no recall even minutes later.  Pt did recall, "I need my walker".  General Gait Details: VERY unsteady gait with decreased weightshift and stance time R LE.  75% VC's on safety with turns.  HIGH FALL RISK. General stair comments: so stair training was attempted for trial.  Pt required 75% VC's on proper sequencing.  Heavy use/lean on walker.  Unsteady.  Unsafe.  Pt struggled due to weakness. Pt would benefit from aggressive Rehab such as Inpt before safely D/C to home.    Recommendations for follow up therapy are one component of a multi-disciplinary discharge planning process, led by the attending physician.  Recommendations may be updated based on patient status, additional functional criteria and insurance authorization.  Follow Up Recommendations  Acute inpatient rehab (3hours/day) Can patient physically be transported by private vehicle: No   Assistance Recommended at Discharge Frequent or constant Supervision/Assistance  Patient can return home with the following A lot of help with  bathing/dressing/bathroom;Assistance with cooking/housework;Assist for transportation;Help with stairs or ramp for entrance;Two people to help with walking and/or transfers   Equipment Recommendations  None recommended by PT    Recommendations for Other Services       Precautions / Restrictions Precautions Precautions: Fall;Posterior Hip Precaution Comments: unable to recall THP, reviewed (memory deficits at baseline) Restrictions Weight Bearing Restrictions: No RLE Weight Bearing: Weight bearing as tolerated Other Position/Activity Restrictions: Posterior Hip Precautions     Mobility  Bed Mobility Overal bed mobility: Needs Assistance Bed Mobility: Supine to Sit     Supine to sit: Min assist, Mod assist     General bed mobility comments: increased effort and increased time with 75% VC's to adhere to THP    Transfers Overall transfer level: Needs assistance Equipment used: Rolling walker (2 wheels) Transfers: Sit to/from Stand Sit to Stand: Mod assist           General transfer comment: 100% VC's on THP to avoid hip flex > 90 degrees with sit to stand and stand to sit.  "extend LE and keep head up".  Pt will comply each time but no recall even minutes later.  Pt did recall, "I need my walker".    Ambulation/Gait Ambulation/Gait assistance: Min assist, Mod assist Gait Distance (Feet): 24 Feet Assistive device: Rolling walker (2 wheels) Gait Pattern/deviations: Step-to pattern, Antalgic, Decreased stance time - right Gait velocity: decreased     General Gait Details: VERY unsteady gait with decreased weightshift and stance time R LE.  75% VC's on safety with turns.  HIGH FALL RISK.   Stairs  Stairs: Yes Stairs assistance: Mod assist, Max assist   Number of Stairs: 2 General stair comments: so stair training was attempted for trial.  Pt required 75% VC's on proper sequencing.  Heavy use/lean on walker.  Unsteady.  Unsafe.  Pt struggled due to  weakness.   Wheelchair Mobility    Modified Rankin (Stroke Patients Only)       Balance                                            Cognition Arousal/Alertness: Awake/alert Behavior During Therapy: WFL for tasks assessed/performed Overall Cognitive Status: History of cognitive impairments - at baseline                                 General Comments: AxO x 2 Hx Cognitive Impairements with poor ST Memeory.  Pleasant.  Following all commands.  Requires repeat Instructions.  Unable to recall any THP. "Seabron Spates take some time (to heal)" stated pt.        Exercises      General Comments        Pertinent Vitals/Pain Pain Assessment Pain Assessment: Faces Faces Pain Scale: Hurts little more Pain Location: R hip Pain Descriptors / Indicators: Grimacing, Guarding, Operative site guarding Pain Intervention(s): Monitored during session, Repositioned    Home Living                          Prior Function            PT Goals (current goals can now be found in the care plan section) Progress towards PT goals: Progressing toward goals    Frequency    Min 3X/week      PT Plan Discharge plan needs to be updated;Frequency needs to be updated    Co-evaluation              AM-PAC PT "6 Clicks" Mobility   Outcome Measure  Help needed turning from your back to your side while in a flat bed without using bedrails?: A Lot Help needed moving from lying on your back to sitting on the side of a flat bed without using bedrails?: A Lot Help needed moving to and from a bed to a chair (including a wheelchair)?: A Lot Help needed standing up from a chair using your arms (e.g., wheelchair or bedside chair)?: A Lot Help needed to walk in hospital room?: A Lot Help needed climbing 3-5 steps with a railing? : A Lot 6 Click Score: 12    End of Session Equipment Utilized During Treatment: Gait belt Activity Tolerance: Patient tolerated  treatment well;Patient limited by fatigue;Patient limited by pain Patient left: in chair;with call bell/phone within reach;with chair alarm set Nurse Communication: Mobility status PT Visit Diagnosis: Other abnormalities of gait and mobility (R26.89);Muscle weakness (generalized) (M62.81);Pain;Difficulty in walking, not elsewhere classified (R26.2) Pain - Right/Left: Right Pain - part of body: Hip     Time: 0932-3557 PT Time Calculation (min) (ACUTE ONLY): 19 min  Charges:  $Gait Training: 8-22 mins                     Rica Koyanagi  PTA [place] [facility] M-F          [phone] Weekend pager [phone]

## 2022-07-13 LAB — CBC WITH DIFFERENTIAL/PLATELET
Abs Immature Granulocytes: 0.04 10*3/uL (ref 0.00–0.07)
Basophils Absolute: 0 10*3/uL (ref 0.0–0.1)
Basophils Relative: 1 %
Eosinophils Absolute: 0.1 10*3/uL (ref 0.0–0.5)
Eosinophils Relative: 1 %
HCT: 30.9 % — ABNORMAL LOW (ref 39.0–52.0)
Hemoglobin: 9.6 g/dL — ABNORMAL LOW (ref 13.0–17.0)
Immature Granulocytes: 1 %
Lymphocytes Relative: 19 %
Lymphs Abs: 1.6 10*3/uL (ref 0.7–4.0)
MCH: 31 pg (ref 26.0–34.0)
MCHC: 31.1 g/dL (ref 30.0–36.0)
MCV: 99.7 fL (ref 80.0–100.0)
Monocytes Absolute: 0.6 10*3/uL (ref 0.1–1.0)
Monocytes Relative: 8 %
Neutro Abs: 5.8 10*3/uL (ref 1.7–7.7)
Neutrophils Relative %: 70 %
Platelets: 448 10*3/uL — ABNORMAL HIGH (ref 150–400)
RBC: 3.1 MIL/uL — ABNORMAL LOW (ref 4.22–5.81)
RDW: 12.6 % (ref 11.5–15.5)
WBC: 8.2 10*3/uL (ref 4.0–10.5)
nRBC: 0 % (ref 0.0–0.2)

## 2022-07-13 LAB — COMPREHENSIVE METABOLIC PANEL
ALT: 13 U/L (ref 0–44)
AST: 14 U/L — ABNORMAL LOW (ref 15–41)
Albumin: 2.3 g/dL — ABNORMAL LOW (ref 3.5–5.0)
Alkaline Phosphatase: 54 U/L (ref 38–126)
Anion gap: 8 (ref 5–15)
BUN: 34 mg/dL — ABNORMAL HIGH (ref 8–23)
CO2: 24 mmol/L (ref 22–32)
Calcium: 8.3 mg/dL — ABNORMAL LOW (ref 8.9–10.3)
Chloride: 103 mmol/L (ref 98–111)
Creatinine, Ser: 1.51 mg/dL — ABNORMAL HIGH (ref 0.61–1.24)
GFR, Estimated: 44 mL/min — ABNORMAL LOW (ref >60)
Glucose, Bld: 104 mg/dL — ABNORMAL HIGH (ref 70–99)
Potassium: 4.5 mmol/L (ref 3.5–5.1)
Sodium: 135 mmol/L (ref 135–145)
Total Bilirubin: 0.5 mg/dL (ref 0.3–1.2)
Total Protein: 5.2 g/dL — ABNORMAL LOW (ref 6.5–8.1)

## 2022-07-13 LAB — MAGNESIUM: Magnesium: 2.2 mg/dL (ref 1.7–2.4)

## 2022-07-13 LAB — PHOSPHORUS: Phosphorus: 3.5 mg/dL (ref 2.5–4.6)

## 2022-07-13 NOTE — Progress Notes (Signed)
Nurse reviewed discharge instructions with pt and his daughter [name].  [name] verbalized understanding of discharge instructions, follow up appointments and new medications.  No concerns at time of discharge.

## 2022-07-13 NOTE — TOC Transition Note (Signed)
Transition of Care [facility]) - CM/SW Discharge Note  Patient Details  Name: Adam Arellano MRN: [id number] Date of Birth: 1931-10-01  Transition of Care [facility] CM/SW Contact:  Sherie Don, LCSW Phone Number: 07/13/2022, 1:55 PM  Clinical Narrative: Patient's appeal for CIR was denied. Daughter agreeable to HHPT/OT referral and requested [facility]. CSW made referral to Amy with [facility], but the agency is unable to accept the referral at this time. CSW made referral to Cindie with [facility], which was accepted. HH orders have been placed. CSW updated daughter. TOC signing off.  Final next level of care: Home w Home Health Services Barriers to Discharge: Barriers Resolved  Patient Goals and CMS Choice CMS Medicare.gov Compare Post Acute Care list provided to:: Patient Represenative (must comment) Campbell Stall (daughter)) Choice offered to / list presented to : Adult Children  Discharge Plan and Services Additional resources added to the After Visit Summary for   In-house Referral: Clinical Social Work Post Acute Care Choice: IP Rehab          DME Arranged: N/A DME Agency: NA HH Arranged: PT, OT HH Agency: [place] Date [facility] Agency Contacted: 07/13/22 Representative spoke with at [place]: [place] Determinants of Health ([place]) Interventions SDOH Screenings   Food Insecurity: No Food Insecurity (07/02/2022)  Housing: Low Risk  (07/02/2022)  Transportation Needs: No Transportation Needs (07/02/2022)  Utilities: Not At Risk (07/02/2022)  Depression (PHQ2-9): Low Risk  (06/30/2022)  Recent Concern: Depression (PHQ2-9) - Medium Risk (05/19/2022)  Tobacco Use: Medium Risk (07/04/2022)   Readmission Risk Interventions    07/04/2022    1:34 PM  Readmission Risk Prevention Plan  Transportation Screening Complete  HRI or [place] Complete  SW Recovery Care/Counseling Consult Complete  Palliative Care Screening Not Applicable  Skilled Nursing Facility  Complete

## 2022-07-13 NOTE — Progress Notes (Signed)
Inpatient Rehab Admissions Coordinator:   Spoke to representative at [facility], as well as pt's daughter.  Expedited appeal has been reviewed and the denial has been upheld.  Will not be able to admit to CIR.  Will sign off.   Shann Medal, PT, DPT Admissions Coordinator [phone] 07/13/22  12:41 PM

## 2022-07-13 NOTE — Progress Notes (Signed)
PROGRESS NOTE    Adam Arellano  CVE:[id number] DOB: [birth year] DOA: 07/01/2022 PCP: Corliss Blacker, MD   Brief Narrative:  The patient is a 89 year old male presenting with mechanical fall and right hip pain.  Underwent surgery for right femoral neck fracture.  Plan for CIR however insurance company denied authorization and peer-to-peer was denied and now family has appealed the decision and awaiting appeal process and decision from [employer].    Assessment and Plan:  Mechanical fall with closed right hip fracture ([place]) -Status post bipolar hip hemiarthroplasty on the right on 07/03/2022 -Orthopedic surgery recommended weightbearing as tolerated with posterior hip precautions for 6 weeks as well as keeping the Aquacel dressing to be kept intact until follow-up in 2 weeks after surgery for wound check -DVT prophylaxis recommended for Lovenox starting postoperative day 1 for 4 weeks and patient is to follow-up with Dr. Zachery Dakins at [place] in 2 weeks. -PT OT recommending acute inpatient rehab and he is currently stable to discharge however his insurance denied rehab stay and peer to peer was done and now family has appealed and awaiting for the appeal decision. -Per TOC is the appeal is still denied the patient will be taken home with home health either going to SNF per the patient's daughter -Continue with acetaminophen 650 mg p.o/RC every 6 as needed for mild pain, oxycodone IR 2.5 to 5 mg p.o. every 4 as needed severe pain and moderate pain as well as IV hydromorphone 1 mg every 4 as needed for moderate-severe pain control with IV narcotics -Continue bowel regimen with MiraLAX 17 g p.o. daily as well as senna docusate 2 tabs p.o. nightly docusate 100 mg p.o. twice daily   Pressure injury Stage 2 of sacrum, not present on admission  -Per WOC RN   Acute on Chronic Macrocytic Anemia -Likely post op, hemodilution. Baseline Hb 11-12 -Hgb/Hct Trend: Recent  Labs  Lab 07/01/22 1633 07/02/22 0502 07/03/22 0407 07/04/22 0442 07/05/22 0501 07/12/22 0840 07/13/22 0416  HGB 11.7* 12.7* 11.0* 9.9* 9.4* 10.4* 9.6*  HCT 35.9* 39.9 34.2* 31.6* 30.3* 33.8* 30.9*  MCV 95.7 97.8 99.4 100.3* 101.3* 101.5* 99.7  -Anemia Panel done and showed an iron level of 23, UIBC 222, TIBC 245, saturation of 9%, ferritin level 111, folate level 8.2 and vitamin B12 216 -Continue to monitor for signs and symptoms of bleeding; no overt bleeding noted -Repeat CBC intermittently if he is still here    Essential Hypertension -Stable. Continue metoprolol tartrate 25 mg p.o. twice daily; his Losartan 100 mg p.o. daily and Furosemide 20 mg p.o. daily were held but will consider resuming slowly -Has IV Hydralazine 5 mg prn q4hprn for SBP>180 -Continue to Monitor BP per Protocol -Last BP reading was 151/69   PVD (Peripheral vascular disease) (HCC) -Continue Aspirin 81 mg po Daily and Clopidogrel 75 mg po Daily     Leukocytosis -Likely Reactive and Improved -WBC Trend: Recent Labs  Lab 07/01/22 1633 07/02/22 0502 07/03/22 0407 07/04/22 0442 07/05/22 0501 07/12/22 0840 07/13/22 0416  WBC 10.0 14.0* 10.8* 10.6* 10.1 8.2 8.2  -Continue to Monitor and Trend and Repeat CBC intermittently if he is still here    Coronary Artery Disease -Stable and denies any CP -Continue Aspirin 81 mg po Daily, NTG 0.4 mg SL q41mn PRN CP, and Metoprolol Tartrate 25 mg po BID   -Clopidogrel 75 mg po Daily had been held but will resume after discussion with Orthopedic Surgery   Hypoalbuminemia -Patient's Albumin  Trend: Recent Labs  Lab 07/12/22 0840 07/13/22 0416  ALBUMIN 2.6* 2.3*  -Continue to Monitor and Trend and repeat CMP intermittently if he is still here    Thrombocytosis -Likely Reactive -Plt Count Trend: Recent Labs  Lab 07/01/22 1633 07/02/22 0502 07/03/22 0407 07/04/22 0442 07/05/22 0501 07/12/22 0840 07/13/22 0416  PLT 255 241 207 200 212 494* 448*   -Continue to Monitor and Trend and repeat CBC intermittently if he is still here   Stage 3b chronic kidney disease (HCC) -Baseline creatinine ~1.8.  Creatinine currently around baseline  -BUN/Cr Trend: Recent Labs  Lab 07/01/22 1633 07/02/22 0502 07/03/22 0407 07/04/22 0442 07/05/22 0501 07/09/22 0352 07/12/22 0840 07/13/22 0416  BUN 38* 33* 27* 28* 30*  --  33* 34*  CREATININE 1.87* 1.77* 1.64* 1.73* 1.71*   < > 1.72* 1.51*   < > = values in this interval not displayed.  -Avoid Nephrotoxic Medications, Contrast Dyes, Hypotension and Dehydration to Ensure Adequate Renal Perfusion and will need to Renally Adjust Meds -Continue to Monitor and Trend Renal Function carefully and repeat CMP intermittently if he is still here  DVT prophylaxis: enoxaparin (LOVENOX) injection 30 mg Start: 07/02/22 1000 SCDs Start: 07/02/22 0225    Code Status: DNR Family Communication: No family present at bedside   Disposition Plan:  Level of care: Med-Surg Status is: Inpatient Remains inpatient appropriate because: Currently he is medically stable for discharge to inpatient rehabilitation however his insurance authorization has been denied and currently awaiting family appeal decision from the insurance company    Consultants:  Orthopedic Surgery  CIR  Procedures:  S/p R hip hemi for femoral neck fracture 07/03/22   Antimicrobials:  Anti-infectives (From admission, onward)    Start     Dose/Rate Route Frequency Ordered Stop   07/03/22 1700  ceFAZolin (ANCEF) IVPB 2g/100 mL premix        2 g 200 mL/hr over 30 Minutes Intravenous Every 8 hours 07/03/22 1357 07/03/22 2110   07/03/22 0600  ceFAZolin (ANCEF) IVPB 2g/100 mL premix        2 g 200 mL/hr over 30 Minutes Intravenous On call to O.R. 07/02/22 2025 07/03/22 1108       Subjective: Seen and examined at bedside and he is sitting in the chair and appears calm.  Complains of some hip pain and he states is worse when he moves.  States is  a 5 out of 10 but has not asked for any pain medication.  No nausea or vomiting.  No lightheadedness or dizziness.  No other concerns or complaints at this time.   Objective: Vitals:   07/12/22 0432 07/12/22 1251 07/12/22 2142 07/13/22 0550  BP: (!) 141/65 129/68 (!) 140/71 (!) 151/69  Pulse: 77 (!) 106 66 63  Resp: '18 17 18   '$ Temp: 97.7 F (36.5 C) (!) 97.5 F (36.4 C) 98.1 F (36.7 C) 98.3 F (36.8 C)  TempSrc:   Oral Oral  SpO2: 97% 99% 100% 99%  Weight:      Height:        Intake/Output Summary (Last 24 hours) at 07/13/2022 7829 Last data filed at 07/13/2022 5621 Gross per 24 hour  Intake 1030 ml  Output 900 ml  Net 130 ml   Filed Weights   07/09/22 2300  Weight: 68 kg   Examination: Physical Exam:  Constitutional: Elderly Caucasian male currently no acute distress appears calm and comfortable sitting in the chair bedside Respiratory: Diminished to auscultation bilaterally with coarse breath  sounds, no wheezing, rales, rhonchi or crackles. Normal respiratory effort and patient is not tachypenic. No accessory muscle use.  Unlabored breathing Cardiovascular: RRR, no murmurs / rubs / gallops. S1 and S2 auscultated.  Minimal extremity Abdomen: Soft, non-tender, non-distended.  Bowel sounds positive.  GU: Deferred. Musculoskeletal: No clubbing / cyanosis of digits/nails. No joint deformity upper and lower extremities.  Skin: No rashes, lesions, ulcers on limited skin evaluation. No induration; Warm and dry.  Neurologic: CN 2-12 grossly intact with no focal deficits. Romberg sign and cerebellar reflexes not assessed.  Psychiatric: Normal judgment and insight. Alert and awake with a normal mood and appropriate affect.   Data Reviewed: I have personally reviewed following labs and imaging studies  CBC: Recent Labs  Lab 07/12/22 0840 07/13/22 0416  WBC 8.2 8.2  NEUTROABS 6.4 5.8  HGB 10.4* 9.6*  HCT 33.8* 30.9*  MCV 101.5* 99.7  PLT 494* 462*   Basic Metabolic  Panel: Recent Labs  Lab 07/09/22 0352 07/12/22 0840 07/13/22 0416  NA  --  135 135  K  --  3.9 4.5  CL  --  103 103  CO2  --  25 24  GLUCOSE  --  144* 104*  BUN  --  33* 34*  CREATININE 1.51* 1.72* 1.51*  CALCIUM  --  8.3* 8.3*  MG  --  2.4 2.2  PHOS  --  3.8 3.5   GFR: Estimated Creatinine Clearance: 32.5 mL/min (A) (by C-G formula based on SCr of 1.51 mg/dL (H)). Liver Function Tests: Recent Labs  Lab 07/12/22 0840 07/13/22 0416  AST 14* 14*  ALT 13 13  ALKPHOS 63 54  BILITOT 0.7 0.5  PROT 6.2* 5.2*  ALBUMIN 2.6* 2.3*   No results for input(s): "LIPASE", "AMYLASE" in the last 168 hours. No results for input(s): "AMMONIA" in the last 168 hours. Coagulation Profile: No results for input(s): "INR", "PROTIME" in the last 168 hours. Cardiac Enzymes: No results for input(s): "CKTOTAL", "CKMB", "CKMBINDEX", "TROPONINI" in the last 168 hours. BNP (last 3 results) Recent Labs    11/02/21 1141  PROBNP 1,085*   HbA1C: No results for input(s): "HGBA1C" in the last 72 hours. CBG: No results for input(s): "GLUCAP" in the last 168 hours. Lipid Profile: No results for input(s): "CHOL", "HDL", "LDLCALC", "TRIG", "CHOLHDL", "LDLDIRECT" in the last 72 hours. Thyroid Function Tests: No results for input(s): "TSH", "T4TOTAL", "FREET4", "T3FREE", "THYROIDAB" in the last 72 hours. Anemia Panel: No results for input(s): "VITAMINB12", "FOLATE", "FERRITIN", "TIBC", "IRON", "RETICCTPCT" in the last 72 hours. Sepsis Labs: No results for input(s): "PROCALCITON", "LATICACIDVEN" in the last 168 hours.  No results found for this or any previous visit (from the past 240 hour(s)).   Radiology Studies: No results found.  Scheduled Meds:  aspirin EC  81 mg Oral Daily   clopidogrel  75 mg Oral Daily   docusate sodium  100 mg Oral BID   enoxaparin (LOVENOX) injection  30 mg Subcutaneous Q24H   feeding supplement  237 mL Oral BID BM   gabapentin  100 mg Oral QHS   metoprolol tartrate   25 mg Oral BID   polyethylene glycol  17 g Oral Daily   senna-docusate  2 tablet Oral QHS   Continuous Infusions:   LOS: 11 days   Raiford Noble, DO [facility] Available via Epic secure chat 7am-7pm After these hours, please refer to coverage provider listed on [email or web address] 07/13/2022, 8:14 AM

## 2022-07-13 NOTE — Discharge Summary (Signed)
Physician Discharge Summary   Patient: Adam Arellano MRN: [id number] DOB: 06/04/1932  Admit date:     07/01/2022  Discharge date: 07/13/22  Discharge Physician: Raiford Noble, DO   PCP: Corliss Blacker, MD   Recommendations at discharge:    Follow up with PCP within 1-2 weeks and repeat CBC, CMP, Mag, Phos within 1 week  Discharge Diagnoses: Principal Problem:   Closed right hip fracture ([place]) Active Problems:   Hyperlipidemia   Primary hypertension   PVD (peripheral vascular disease) (HCC)   Coronary artery disease   Stage 3b chronic kidney disease ([place])   H/O: CVA (cerebrovascular accident)   Asthma, chronic   Fall from ground level  Resolved Problems:   * No resolved hospital problems. [facility] Hospital Course: The patient is a 89 year old male presenting with mechanical fall and right hip pain.  Underwent surgery for right femoral neck fracture.  Plan for CIR however insurance company denied authorization and peer-to-peer was denied and now family has appealed the decision and awaiting appeal process and decision from [employer] and unfortunately the denial appeal was upheld.  Patient's daughter did not want to take him to a SNF and so she is going to take him home with home health services.  He is remains medically stable for discharge at this time and will need to follow with PCP and orthopedic surgeon outpatient setting  Assessment and Plan:  Mechanical fall with closed right hip fracture ([place]) -Status post bipolar hip hemiarthroplasty on the right on 07/03/2022 -Orthopedic surgery recommended weightbearing as tolerated with posterior hip precautions for 6 weeks as well as keeping the Aquacel dressing to be kept intact until follow-up in 2 weeks after surgery for wound check -DVT prophylaxis recommended for Lovenox starting postoperative day 1 for 4 weeks and patient is to follow-up with Dr. Zachery Dakins at [facility] in 2 weeks. -PT OT recommending  acute inpatient rehab and he is currently stable to discharge however his insurance denied rehab stay and peer to peer was done and now family has appealed and awaiting for the appeal decision. -Per TOC is the appeal is still denied the patient will be taken home with home health either going to SNF per the patient's daughter -Continue with acetaminophen 650 mg p.o/RC every 6 as needed for mild pain, oxycodone IR 2.5 to 5 mg p.o. every 4 as needed severe pain and moderate pain as well as IV hydromorphone 1 mg every 4 as needed for moderate-severe pain control with IV narcotics -Continue bowel regimen with MiraLAX 17 g p.o. daily as well as senna docusate 2 tabs p.o. nightly docusate 100 mg p.o. twice daily   Pressure injury Stage 2 of sacrum, not present on admission  -Per WOC RN   Acute on Chronic Macrocytic Anemia -Likely post op, hemodilution. Baseline Hb 11-12 -Hgb/Hct Trend: Last Labs           Recent Labs  Lab 07/01/22 1633 07/02/22 0502 07/03/22 0407 07/04/22 0442 07/05/22 0501 07/12/22 0840 07/13/22 0416  HGB 11.7* 12.7* 11.0* 9.9* 9.4* 10.4* 9.6*  HCT 35.9* 39.9 34.2* 31.6* 30.3* 33.8* 30.9*  MCV 95.7 97.8 99.4 100.3* 101.3* 101.5* 99.7    -Anemia Panel done and showed an iron level of 23, UIBC 222, TIBC 245, saturation of 9%, ferritin level 111, folate level 8.2 and vitamin B12 216 -Continue to monitor for signs and symptoms of bleeding; no overt bleeding noted -Repeat CBC intermittently if he is still here  Essential Hypertension -Stable. Continue metoprolol tartrate 25 mg p.o. twice daily; his Losartan 100 mg p.o. daily and Furosemide 20 mg p.o. daily were held but will consider resuming slowly -Has IV Hydralazine 5 mg prn q4hprn for SBP>180 -Continue to Monitor BP per Protocol -Last BP reading was 151/69   PVD (Peripheral vascular disease) (HCC) -Continue Aspirin 81 mg po Daily and Clopidogrel 75 mg po Daily     Leukocytosis -Likely Reactive and Improved -WBC  Trend: Last Labs           Recent Labs  Lab 07/01/22 1633 07/02/22 0502 07/03/22 0407 07/04/22 0442 07/05/22 0501 07/12/22 0840 07/13/22 0416  WBC 10.0 14.0* 10.8* 10.6* 10.1 8.2 8.2    -Continue to Monitor and Trend and Repeat CBC intermittently if he is still here    Coronary Artery Disease -Stable and denies any CP -Continue Aspirin 81 mg po Daily, NTG 0.4 mg SL q79mn PRN CP, and Metoprolol Tartrate 25 mg po BID   -Clopidogrel 75 mg po Daily had been held but will resume after discussion with Orthopedic Surgery   Hypoalbuminemia -Patient's Albumin Trend: Last Labs      Recent Labs  Lab 07/12/22 0840 07/13/22 0416  ALBUMIN 2.6* 2.3*    -Continue to Monitor and Trend and repeat CMP intermittently if he is still here    Thrombocytosis -Likely Reactive -Plt Count Trend: Last Labs           Recent Labs  Lab 07/01/22 1633 07/02/22 0502 07/03/22 0407 07/04/22 0442 07/05/22 0501 07/12/22 0840 07/13/22 0416  PLT 255 241 207 200 212 494* 448*    -Continue to Monitor and Trend and repeat CBC intermittently if he is still here   Stage 3b chronic kidney disease (HCC) -Baseline creatinine ~1.8.  Creatinine currently around baseline  -BUN/Cr Trend: Last Labs            Recent Labs  Lab 07/01/22 1633 07/02/22 0502 07/03/22 0407 07/04/22 0442 07/05/22 0501 07/09/22 0352 07/12/22 0840 07/13/22 0416  BUN 38* 33* 27* 28* 30*  --  33* 34*  CREATININE 1.87* 1.77* 1.64* 1.73* 1.71*   < > 1.72* 1.51*   < > = values in this interval not displayed.    -Avoid Nephrotoxic Medications, Contrast Dyes, Hypotension and Dehydration to Ensure Adequate Renal Perfusion and will need to Renally Adjust Meds -Continue to Monitor and Trend Renal Function carefully and repeat CMP intermittently if he is still here  Active Pressure Injury/Wound(s)     Pressure Ulcer  Duration          Pressure Injury 07/07/22 Buttocks Right;Left;Mid Stage 2 -  Partial thickness loss of dermis  presenting as a shallow open injury with a red, pink wound bed without slough. Two wounds where buttocks skin meet. Approx 2x2cm of unblanchable lesions. Red bla 5 days           Consultants: Orthopedic surgery, CIR Procedures performed: Status post right hip Hemi for femoral neck fracture on 07/03/2022 Disposition: Home health as patient's daughter did not want to pursue SNF Diet recommendation:  Regular diet DISCHARGE MEDICATION: Allergies as of 07/13/2022       Reactions   Simvastatin Other (See Comments)   MUSCLE ACHES REAL BAD   Atorvastatin    Other reaction(s): stiffness, decreased energy   Lisinopril Cough   Rosuvastatin    Other reaction(s): weakness        Medication List     TAKE these medications    acetaminophen 325  MG tablet Commonly known as: TYLENOL Take 1-2 tablets (325-650 mg total) by mouth every 4 (four) hours as needed for mild pain.   aspirin EC 81 MG tablet Take 81 mg by mouth daily. Swallow whole.   ciprofloxacin-dexamethasone OTIC suspension Commonly known as: CIPRODEX Place 4 drops into the left ear 2 (two) times daily as needed (ear infection).   clopidogrel 75 MG tablet Commonly known as: PLAVIX Take 1 tablet (75 mg total) by mouth daily.   docusate sodium 100 MG capsule Commonly known as: COLACE Take 1 capsule (100 mg total) by mouth 2 (two) times daily.   enoxaparin 30 MG/0.3ML injection Commonly known as: LOVENOX Inject 0.3 mLs (30 mg total) into the skin daily for 21 doses.   FIBER PO Take 1 capsule by mouth daily.   furosemide 20 MG tablet Commonly known as: LASIX Take 1 tablet (20 mg total) by mouth daily.   gabapentin 100 MG capsule Commonly known as: NEURONTIN Take 1 capsule (100 mg total) by mouth at bedtime.   losartan 25 MG tablet Commonly known as: COZAAR TAKE 1 TABLET DAILY   metoprolol tartrate 25 MG tablet Commonly known as: LOPRESSOR Take 25 mg by mouth 2 (two) times daily.   multivitamin with minerals  Tabs tablet Take 1 tablet by mouth daily.   Muscle Rub 10-15 % Crea Apply 1 Application topically 2 (two) times daily as needed for muscle pain.   nitroGLYCERIN 0.4 MG SL tablet Commonly known as: NITROSTAT PLACE 1 TABLET UNDER THE TONGUE AT ONSET OF CHEST PAIN EVERY 5 MINTUES UP TO 3 TIMES AS NEEDED   oxyCODONE 5 MG immediate release tablet Commonly known as: Oxy IR/ROXICODONE Take 0.5 tablets (2.5 mg total) by mouth every 4 (four) hours as needed for severe pain or moderate pain.   polyethylene glycol 17 g packet Commonly known as: MIRALAX / GLYCOLAX Take 17 g by mouth daily as needed for mild constipation. What changed: Another medication with the same name was added. Make sure you understand how and when to take each.   polyethylene glycol 17 g packet Commonly known as: MIRALAX / GLYCOLAX Take 17 g by mouth daily. What changed: You were already taking a medication with the same name, and this prescription was added. Make sure you understand how and when to take each.   REFRESH OP Place 1 drop into both eyes daily as needed (dry eye).               Discharge Care Instructions  (From admission, onward)           Start     Ordered   07/13/22 0000  Discharge wound care:       Comments: Wound care to Stage 2 pressure injuries at  at apex of gluteal cleft: Cleanse with NS, pat dry. Cover affected area with folded layer of xeroform gauze Kellie Simmering # [id number]), top with dry gauze and cover with silicone foam for the sacrum placed with the "tip" oriented pointing away from the anus for improved coverage in the affected area.  Further wound care of Hip deferred to Ortho   07/13/22 1344            Follow-up Information     [name], [name], MD Follow up.   Specialty: Internal Medicine Why: As needed Contact information: [address] [place] [address] [phone]         Willaim Sheng, MD. Schedule an appointment as soon as possible  for a visit in 1 week(s).   Specialty: Orthopedic Surgery Contact information: [address] [place] [address] [phone]                Discharge Exam: Danley Danker Weights   07/09/22 2300  Weight: 68 kg   Vitals:   07/13/22 0550 07/13/22 1316  BP: (!) 151/69 113/72  Pulse: 63 89  Resp:  18  Temp: 98.3 F (36.8 C) 98 F (36.7 C)  SpO2: 99% (!) 89%   EXAM FROM EARLIER:  Examination: Physical Exam:   Constitutional: Elderly Caucasian male currently no acute distress appears calm and comfortable sitting in the chair bedside Respiratory: Diminished to auscultation bilaterally with coarse breath sounds, no wheezing, rales, rhonchi or crackles. Normal respiratory effort and patient is not tachypenic. No accessory muscle use.  Unlabored breathing Cardiovascular: RRR, no murmurs / rubs / gallops. S1 and S2 auscultated.  Minimal extremity Abdomen: Soft, non-tender, non-distended.  Bowel sounds positive.  GU: Deferred. Musculoskeletal: No clubbing / cyanosis of digits/nails. No joint deformity upper and lower extremities.  Skin: No rashes, lesions, ulcers on limited skin evaluation. No induration; Warm and dry.  Neurologic: CN 2-12 grossly intact with no focal deficits. Romberg sign and cerebellar reflexes not assessed.  Psychiatric: Normal judgment and insight. Alert and awake with a normal mood and appropriate affect.   Condition at discharge: stable  The results of significant diagnostics from this hospitalization (including imaging, microbiology, ancillary and laboratory) are listed below for reference.   Imaging Studies: DG HIP UNILAT W OR W/O PELVIS 2-3 VIEWS RIGHT  Result Date: 07/03/2022 CLINICAL DATA:  Status post right hip arthroplasty. EXAM: DG HIP (WITH OR WITHOUT PELVIS) 2-3V RIGHT COMPARISON:  Pelvis and right hip radiographs 07/01/2022 FINDINGS: There is diffuse decreased bone mineralization. Interval right bipolar hemiarthroplasty. No perihardware  lucency is seen to indicate hardware failure or loosening. The left femoroacetabular joint space is maintained. Mild superolateral left acetabular degenerative osteophytosis. The bilateral sacroiliac and pubic symphysis joint spaces are maintained. Moderate right L3-4 and L4-5 disc space narrowing and endplate osteophytosis. Numerous surgical clips overlie the pelvis. IMPRESSION: Interval right bipolar hemiarthroplasty without evidence of hardware failure. Electronically Signed   By: Yvonne Kendall M.D.   On: 07/03/2022 16:27   DG FEMUR PORT, MIN 2 VIEWS RIGHT  Result Date: 07/01/2022 CLINICAL DATA:  Proximal femur fracture, preop. EXAM: RIGHT FEMUR PORTABLE 2 VIEW COMPARISON:  Hip radiograph earlier today that included the proximal femur. FINDINGS: The included femur is intact. No fracture of the mid or distal femur. Knee alignment is preserved. Cannot assess for joint effusion due to positioning. Vascular calcifications are seen. IMPRESSION: No fracture of the mid or distal femur. Electronically Signed   By: Keith Rake M.D.   On: 07/01/2022 21:09   DG HIP UNILAT WITH PELVIS 2-3 VIEWS RIGHT  Result Date: 07/01/2022 CLINICAL DATA:  Pain EXAM: DG HIP (WITH OR WITHOUT PELVIS) 2-3V RIGHT COMPARISON:  04/02/2022 FINDINGS: Acute fracture of the right femoral neck with mild proximal migration and external rotation. Right hip joint remains intact without dislocation. Mild osteoarthritic changes of both hips. No evidence of pelvic fracture or diastasis. Bones are demineralized. No suspicious bone lesion is identified. IMPRESSION: Acute right femoral neck fracture. Electronically Signed   By: Davina Poke D.O.   On: 07/01/2022 16:56    Microbiology: Results for orders placed or performed during the hospital encounter of 07/01/22  Surgical PCR screen     Status: None  Collection Time: 07/02/22  8:42 PM   Specimen: Nasal Mucosa; Nasal Swab  Result Value Ref Range Status   MRSA, PCR NEGATIVE NEGATIVE  Final   Staphylococcus aureus NEGATIVE NEGATIVE Final    Comment: (NOTE) The Xpert SA Assay (FDA approved for NASAL specimens in patients 32 years of age and older), is one component of a comprehensive surveillance program. It is not intended to diagnose infection nor to guide or monitor treatment. Performed at [facility], [place] [address], [place],  [address]    Labs: CBC: Recent Labs  Lab 07/12/22 0840 07/13/22 0416  WBC 8.2 8.2  NEUTROABS 6.4 5.8  HGB 10.4* 9.6*  HCT 33.8* 30.9*  MCV 101.5* 99.7  PLT 494* 001*   Basic Metabolic Panel: Recent Labs  Lab 07/09/22 0352 07/12/22 0840 07/13/22 0416  NA  --  135 135  K  --  3.9 4.5  CL  --  103 103  CO2  --  25 24  GLUCOSE  --  144* 104*  BUN  --  33* 34*  CREATININE 1.51* 1.72* 1.51*  CALCIUM  --  8.3* 8.3*  MG  --  2.4 2.2  PHOS  --  3.8 3.5   Liver Function Tests: Recent Labs  Lab 07/12/22 0840 07/13/22 0416  AST 14* 14*  ALT 13 13  ALKPHOS 63 54  BILITOT 0.7 0.5  PROT 6.2* 5.2*  ALBUMIN 2.6* 2.3*   CBG: No results for input(s): "GLUCAP" in the last 168 hours.  Discharge time spent: greater than 30 minutes.  Signed: Raiford Noble, DO [facility] 07/13/2022

## 2022-08-07 DIAGNOSIS — S72001D Fracture of unspecified part of neck of right femur, subsequent encounter for closed fracture with routine healing: Secondary | ICD-10-CM | POA: Diagnosis not present

## 2022-08-07 DIAGNOSIS — I129 Hypertensive chronic kidney disease with stage 1 through stage 4 chronic kidney disease, or unspecified chronic kidney disease: Secondary | ICD-10-CM | POA: Diagnosis not present

## 2022-08-07 DIAGNOSIS — L89312 Pressure ulcer of right buttock, stage 2: Secondary | ICD-10-CM | POA: Diagnosis not present

## 2022-08-07 DIAGNOSIS — N1832 Chronic kidney disease, stage 3b: Secondary | ICD-10-CM | POA: Diagnosis not present

## 2022-08-07 DIAGNOSIS — L89322 Pressure ulcer of left buttock, stage 2: Secondary | ICD-10-CM | POA: Diagnosis not present

## 2022-08-10 DIAGNOSIS — L89322 Pressure ulcer of left buttock, stage 2: Secondary | ICD-10-CM | POA: Diagnosis not present

## 2022-08-10 DIAGNOSIS — I129 Hypertensive chronic kidney disease with stage 1 through stage 4 chronic kidney disease, or unspecified chronic kidney disease: Secondary | ICD-10-CM | POA: Diagnosis not present

## 2022-08-10 DIAGNOSIS — S72001D Fracture of unspecified part of neck of right femur, subsequent encounter for closed fracture with routine healing: Secondary | ICD-10-CM | POA: Diagnosis not present

## 2022-08-10 DIAGNOSIS — L89312 Pressure ulcer of right buttock, stage 2: Secondary | ICD-10-CM | POA: Diagnosis not present

## 2022-08-10 DIAGNOSIS — N1832 Chronic kidney disease, stage 3b: Secondary | ICD-10-CM | POA: Diagnosis not present

## 2022-08-15 DIAGNOSIS — S72041D Displaced fracture of base of neck of right femur, subsequent encounter for closed fracture with routine healing: Secondary | ICD-10-CM | POA: Diagnosis not present

## 2022-08-30 DIAGNOSIS — I129 Hypertensive chronic kidney disease with stage 1 through stage 4 chronic kidney disease, or unspecified chronic kidney disease: Secondary | ICD-10-CM | POA: Diagnosis not present

## 2022-08-30 DIAGNOSIS — N1832 Chronic kidney disease, stage 3b: Secondary | ICD-10-CM | POA: Diagnosis not present

## 2022-08-30 DIAGNOSIS — S72001D Fracture of unspecified part of neck of right femur, subsequent encounter for closed fracture with routine healing: Secondary | ICD-10-CM | POA: Diagnosis not present

## 2022-08-30 DIAGNOSIS — Z8673 Personal history of transient ischemic attack (TIA), and cerebral infarction without residual deficits: Secondary | ICD-10-CM | POA: Diagnosis not present

## 2022-09-12 ENCOUNTER — Ambulatory Visit: Payer: Medicare Other | Admitting: Adult Health

## 2022-10-15 ENCOUNTER — Encounter (HOSPITAL_BASED_OUTPATIENT_CLINIC_OR_DEPARTMENT_OTHER): Payer: Self-pay

## 2022-10-15 ENCOUNTER — Emergency Department (HOSPITAL_BASED_OUTPATIENT_CLINIC_OR_DEPARTMENT_OTHER)
Admission: EM | Admit: 2022-10-15 | Discharge: 2022-10-15 | Disposition: A | Discharge status: Home / Self Care | Payer: Medicare Other | Attending: Emergency Medicine | Admitting: Emergency Medicine

## 2022-10-15 DIAGNOSIS — R040 Epistaxis: Secondary | ICD-10-CM | POA: Diagnosis not present

## 2022-10-15 DIAGNOSIS — Z7902 Long term (current) use of antithrombotics/antiplatelets: Secondary | ICD-10-CM | POA: Diagnosis not present

## 2022-10-15 DIAGNOSIS — Z7982 Long term (current) use of aspirin: Secondary | ICD-10-CM | POA: Diagnosis not present

## 2022-10-15 DIAGNOSIS — Z743 Need for continuous supervision: Secondary | ICD-10-CM | POA: Diagnosis not present

## 2022-10-15 DIAGNOSIS — R58 Hemorrhage, not elsewhere classified: Secondary | ICD-10-CM | POA: Diagnosis not present

## 2022-10-15 DIAGNOSIS — R6889 Other general symptoms and signs: Secondary | ICD-10-CM | POA: Diagnosis not present

## 2022-10-15 MED ORDER — LIDOCAINE-EPINEPHRINE (PF) 2 %-1:200000 IJ SOLN
10.0000 mL | Freq: Once | INTRAMUSCULAR | Status: AC
  Administered 2022-10-15: 10 mL via INTRADERMAL
  Filled 2022-10-15: qty 20

## 2022-10-15 MED ORDER — OXYMETAZOLINE HCL 0.05 % NA SOLN
1.0000 | Freq: Once | NASAL | Status: AC
  Administered 2022-10-15: 1 via NASAL
  Filled 2022-10-15: qty 30

## 2022-10-15 NOTE — ED Triage Notes (Signed)
Patient has intermittent nose bleeds x 2 days, this morning it started about 0445 And he hasn't been able to get it to stop. No hx of nose bleeds, patient takes ASA everyday.   Patient has been at [facility] for Respite care, normally stays with daughter.

## 2022-10-15 NOTE — ED Provider Notes (Signed)
[place] EMERGENCY DEPARTMENT AT [facility] Provider Note   CSN: [id number] Arrival date & time: 10/15/22  4098     History  Chief Complaint  Patient presents with   Epistaxis    Adam Arellano is a 89 y.o. male.  89 yo M with a chief complaint of left-sided epistaxis.  This has been going on for a couple days off and on.  He has been staying in a rehab facility was family was off on vacation.  They returned back and found out that he was having ongoing nosebleeding and brought him here for evaluation.  He denies trauma to the area.  Does take a baby aspirin a day after having a recent stroke.   Epistaxis      Home Medications Prior to Admission medications   Medication Sig Start Date End Date Taking? Authorizing Provider  acetaminophen (TYLENOL) 325 MG tablet Take 1-2 tablets (325-650 mg total) by mouth every 4 (four) hours as needed for mild pain. 04/21/22   Setzer, Lynnell Jude, PA-C  aspirin EC 81 MG tablet Take 81 mg by mouth daily. Swallow whole.    [provider]  ciprofloxacin-dexamethasone (CIPRODEX) OTIC suspension Place 4 drops into the left ear 2 (two) times daily as needed (ear infection).    [provider]  clopidogrel (PLAVIX) 75 MG tablet Take 1 tablet (75 mg total) by mouth daily. 05/19/22   Jake Bathe, MD  docusate sodium (COLACE) 100 MG capsule Take 1 capsule (100 mg total) by mouth 2 (two) times daily. 04/21/22   Setzer, Lynnell Jude, PA-C  enoxaparin (LOVENOX) 30 MG/0.3ML injection Inject 0.3 mLs (30 mg total) into the skin daily for 21 doses. 07/11/22 08/01/22  Standley Brooking, MD  FIBER PO Take 1 capsule by mouth daily.    [provider]  furosemide (LASIX) 20 MG tablet Take 1 tablet (20 mg total) by mouth daily. 05/19/22   Jake Bathe, MD  gabapentin (NEURONTIN) 100 MG capsule Take 1 capsule (100 mg total) by mouth at bedtime. 04/21/22   Setzer, Lynnell Jude, PA-C  losartan (COZAAR) 25 MG tablet TAKE 1 TABLET DAILY  01/16/22   Jake Bathe, MD  Menthol-Methyl Salicylate (MUSCLE RUB) 10-15 % CREA Apply 1 Application topically 2 (two) times daily as needed for muscle pain. 04/21/22   Setzer, Lynnell Jude, PA-C  metoprolol tartrate (LOPRESSOR) 25 MG tablet Take 25 mg by mouth 2 (two) times daily. 10/24/13   Jake Bathe, MD  Multiple Vitamin (MULTIVITAMIN WITH MINERALS) TABS tablet Take 1 tablet by mouth daily.    [provider]  nitroGLYCERIN (NITROSTAT) 0.4 MG SL tablet PLACE 1 TABLET UNDER THE TONGUE AT ONSET OF CHEST PAIN EVERY 5 MINTUES UP TO 3 TIMES AS NEEDED 08/03/21   Janetta Hora, PA-C  oxyCODONE (OXY IR/ROXICODONE) 5 MG immediate release tablet Take 0.5 tablets (2.5 mg total) by mouth every 4 (four) hours as needed for severe pain or moderate pain. 07/10/22   Standley Brooking, MD  polyethylene glycol (MIRALAX / GLYCOLAX) 17 g packet Take 17 g by mouth daily as needed for mild constipation. 04/21/22   Setzer, Lynnell Jude, PA-C  polyethylene glycol (MIRALAX / GLYCOLAX) 17 g packet Take 17 g by mouth daily. 07/11/22   Standley Brooking, MD  Polyvinyl Alcohol-Povidone (REFRESH OP) Place 1 drop into both eyes daily as needed (dry eye).    [provider]      Allergies    Simvastatin, Atorvastatin,  Lisinopril, and Rosuvastatin    Review of Systems   Review of Systems  HENT:  Positive for nosebleeds.     Physical Exam Updated Vital Signs Ht 5\' 6"  (1.676 m)   Wt 72.6 kg   BMI 25.82 kg/m  Physical Exam Vitals and nursing note reviewed.  Constitutional:      Appearance: He is well-developed.  HENT:     Head: Normocephalic and atraumatic.     Nose:     Comments: Blood noted in the left naris, no obvious location of obvious bleeding.  Blood noted in the posterior oropharynx. Eyes:     Pupils: Pupils are equal, round, and reactive to light.  Neck:     Vascular: No JVD.  Cardiovascular:     Rate and Rhythm: Normal rate and regular rhythm.     Heart sounds: No murmur heard.     No friction rub. No gallop.  Pulmonary:     Effort: No respiratory distress.     Breath sounds: No wheezing.  Abdominal:     General: There is no distension.     Tenderness: There is no abdominal tenderness. There is no guarding or rebound.  Musculoskeletal:        General: Normal range of motion.     Cervical back: Normal range of motion and neck supple.  Skin:    Coloration: Skin is not pale.     Findings: No rash.  Neurological:     Mental Status: He is alert and oriented to person, place, and time.  Psychiatric:        Behavior: Behavior normal.     ED Results / Procedures / Treatments   Labs (all labs ordered are listed, but only abnormal results are displayed) Labs Reviewed - No data to display  EKG None  Radiology No results found.  Procedures .Epistaxis Management  Date/Time: 10/15/2022 7:57 AM  Performed by: Melene Plan, DO Authorized by: Melene Plan, DO   Consent:    Consent obtained:  Verbal   Consent given by:  Patient (daughter)   Risks, benefits, and alternatives were discussed: yes     Risks discussed:  Bleeding, infection, nasal injury and pain   Alternatives discussed:  No treatment, delayed treatment and alternative treatment Universal protocol:    Procedure explained and questions answered to patient or proxy's satisfaction: yes     Immediately prior to procedure, a time out was called: yes     Patient identity confirmed:  Verbally with patient Anesthesia:    Anesthesia method:  Topical application   Topical anesthesia: lido with epi. Procedure details:    Treatment site:  L anterior   Treatment method:  Silver nitrate   Treatment complexity:  Limited   Treatment episode: recurring   Post-procedure details:    Assessment:  Bleeding stopped   Procedure completion:  Tolerated well, no immediate complications     Medications Ordered in ED Medications  oxymetazoline (AFRIN) 0.05 % nasal spray 1 spray (1 spray Each Nare Given 10/15/22 0652)   lidocaine-EPINEPHrine (XYLOCAINE W/EPI) 2 %-1:200000 (PF) injection 10 mL (10 mLs Intradermal Given 10/15/22 0740)    ED Course/ Medical Decision Making/ A&P                             Medical Decision Making Risk OTC drugs. Prescription drug management.   89 yo M with a chief complaint of left-sided epistaxis.  Going on for couple days.  Will attempt to control with local measures.  Reassess.  2 rounds of direct pressure with a cotton pledget soaked with lidocaine with epinephrine and Afrin with improvement.  Now visualized a small hypervascular lesion has the likely culprit.  Cauterized.  Bleeding stopped.  Patient was ambulated around the department without recurrence.  Discussed local measures at home.  They were already planning to see their ENT in the office for some left ear pain.  8:13 AM:  I have discussed the diagnosis/risks/treatment options with the patient.  Evaluation and diagnostic testing in the emergency department does not suggest an emergent condition requiring admission or immediate intervention beyond what has been performed at this time.  They will follow up with PCP, ENT. We also discussed returning to the ED immediately if new or worsening sx occur. We discussed the sx which are most concerning (e.g., sudden worsening pain, fever, inability to tolerate by mouth) that necessitate immediate return. Medications administered to the patient during their visit and any new prescriptions provided to the patient are listed below.  Medications given during this visit Medications  oxymetazoline (AFRIN) 0.05 % nasal spray 1 spray (1 spray Each Nare Given 10/15/22 0652)  lidocaine-EPINEPHrine (XYLOCAINE W/EPI) 2 %-1:200000 (PF) injection 10 mL (10 mLs Intradermal Given 10/15/22 0740)     The patient appears reasonably screen and/or stabilized for discharge and I doubt any other medical condition or other [facility] requiring further screening, evaluation, or treatment in the ED at  this time prior to discharge.          Final Clinical Impression(s) / ED Diagnoses Final diagnoses:  Epistaxis    Rx / DC Orders ED Discharge Orders     None         Melene Plan, DO 10/15/22 9604

## 2022-10-15 NOTE — Discharge Instructions (Signed)
You can apply an ointment just to the tip of your pinky finger and apply just to the inside of the nostril.  Do this a couple times a day.  You can try a humidifier in your bedroom as well.  If the bleeding recurs then blow your nose use 2 squirts of the medication and then hold pressure without stopping for 15 minutes.  If that does not work you can try it 1 more time and if that does not work he should come to the emergency department to be reevaluated.

## 2022-10-17 DIAGNOSIS — R040 Epistaxis: Secondary | ICD-10-CM | POA: Diagnosis not present

## 2022-10-17 DIAGNOSIS — H9212 Otorrhea, left ear: Secondary | ICD-10-CM | POA: Diagnosis not present

## 2022-10-29 ENCOUNTER — Other Ambulatory Visit: Payer: Self-pay

## 2022-10-29 ENCOUNTER — Emergency Department (HOSPITAL_BASED_OUTPATIENT_CLINIC_OR_DEPARTMENT_OTHER)
Admission: EM | Admit: 2022-10-29 | Discharge: 2022-10-29 | Disposition: A | Discharge status: Home / Self Care | Payer: Medicare Other | Attending: Emergency Medicine | Admitting: Emergency Medicine

## 2022-10-29 ENCOUNTER — Encounter (HOSPITAL_BASED_OUTPATIENT_CLINIC_OR_DEPARTMENT_OTHER): Payer: Self-pay

## 2022-10-29 DIAGNOSIS — R040 Epistaxis: Secondary | ICD-10-CM | POA: Insufficient documentation

## 2022-10-29 DIAGNOSIS — Z7982 Long term (current) use of aspirin: Secondary | ICD-10-CM | POA: Diagnosis not present

## 2022-10-29 DIAGNOSIS — Z7901 Long term (current) use of anticoagulants: Secondary | ICD-10-CM | POA: Diagnosis not present

## 2022-10-29 MED ORDER — TRANEXAMIC ACID 1000 MG/10ML IV SOLN
500.0000 mg | Freq: Once | INTRAVENOUS | Status: AC
  Administered 2022-10-29: 500 mg via TOPICAL
  Filled 2022-10-29: qty 10

## 2022-10-29 MED ORDER — OXYMETAZOLINE HCL 0.05 % NA SOLN
1.0000 | Freq: Once | NASAL | Status: AC
  Administered 2022-10-29: 1 via NASAL
  Filled 2022-10-29: qty 30

## 2022-10-29 NOTE — ED Triage Notes (Signed)
Patient arrives with complaints of sudden onset of nosebleed that started this morning. Patient states that nose started bleeding while he was training to use the restroom. Attempted to hold pressure on nose and use Afrin spray with no relief prior to arrival. Reports no pain

## 2022-10-29 NOTE — ED Provider Notes (Signed)
[place] [facility] DE[facility]MENT AT [facility] Provider Note   CSN: [id number] Arrival date & time: 10/29/22  0825     History  Chief Complaint  Patient presents with   Epistaxis    Adam Arellano is a 89 y.o. male.  Patient presents to the emergency department today for evaluation of nosebleed.  Per chart on Plavix, but per patient only on baby aspirin currently.  Patient had a left-sided nosebleed that started about 45 minutes prior to arrival.  Patient was seen in the emergency department a couple weeks ago for the same.  This started while straining to have a bowel movement.  They applied Afrin and held pressure for 15 minutes x 2 at home, however symptoms did not improve prompting emergency department visit.  Patient denies other symptoms currently.       Home Medications Prior to Admission medications   Medication Sig Start Date End Date Taking? Authorizing Provider  acetaminophen (TYLENOL) 325 MG tablet Take 1-2 tablets (325-650 mg total) by mouth every 4 (four) hours as needed for mild pain. 04/21/22   Setzer, Lynnell Jude, PA-C  aspirin EC 81 MG tablet Take 81 mg by mouth daily. Swallow whole.    [provider]  ciprofloxacin-dexamethasone (CIPRODEX) OTIC suspension Place 4 drops into the left ear 2 (two) times daily as needed (ear infection).    [provider]  clopidogrel (PLAVIX) 75 MG tablet Take 1 tablet (75 mg total) by mouth daily. 05/19/22   Jake Bathe, MD  docusate sodium (COLACE) 100 MG capsule Take 1 capsule (100 mg total) by mouth 2 (two) times daily. 04/21/22   Setzer, Lynnell Jude, PA-C  enoxaparin (LOVENOX) 30 MG/0.3ML injection Inject 0.3 mLs (30 mg total) into the skin daily for 21 doses. 07/11/22 08/01/22  Standley Brooking, MD  FIBER PO Take 1 capsule by mouth daily.    [provider]  furosemide (LASIX) 20 MG tablet Take 1 tablet (20 mg total) by mouth daily. 05/19/22   Jake Bathe, MD  gabapentin (NEURONTIN) 100 MG  capsule Take 1 capsule (100 mg total) by mouth at bedtime. 04/21/22   Setzer, Lynnell Jude, PA-C  losartan (COZAAR) 25 MG tablet TAKE 1 TABLET DAILY 01/16/22   Jake Bathe, MD  Menthol-Methyl Salicylate (MUSCLE RUB) 10-15 % CREA Apply 1 Application topically 2 (two) times daily as needed for muscle pain. 04/21/22   Setzer, Lynnell Jude, PA-C  metoprolol tartrate (LOPRESSOR) 25 MG tablet Take 25 mg by mouth 2 (two) times daily. 10/24/13   Jake Bathe, MD  Multiple Vitamin (MULTIVITAMIN WITH MINERALS) TABS tablet Take 1 tablet by mouth daily.    [provider]  nitroGLYCERIN (NITROSTAT) 0.4 MG SL tablet PLACE 1 TABLET UNDER THE TONGUE AT ONSET OF CHEST PAIN EVERY 5 MINTUES UP TO 3 TIMES AS NEEDED 08/03/21   Janetta Hora, PA-C  oxyCODONE (OXY IR/ROXICODONE) 5 MG immediate release tablet Take 0.5 tablets (2.5 mg total) by mouth every 4 (four) hours as needed for severe pain or moderate pain. 07/10/22   Standley Brooking, MD  polyethylene glycol (MIRALAX / GLYCOLAX) 17 g packet Take 17 g by mouth daily as needed for mild constipation. 04/21/22   Setzer, Lynnell Jude, PA-C  polyethylene glycol (MIRALAX / GLYCOLAX) 17 g packet Take 17 g by mouth daily. 07/11/22   Standley Brooking, MD  Polyvinyl Alcohol-Povidone (REFRESH OP) Place 1 drop into both eyes daily as needed (dry eye).    [provider]      Allergies    Simvastatin, Atorvastatin, Lisinopril, and Rosuvastatin    Review of Systems   Review of Systems  Physical Exam Updated Vital Signs BP (!) 152/80 (BP Location: Right Arm)   Pulse 75   Temp 98.4 F (36.9 C) (Oral)   Resp 20   Ht 5\' 6"  (1.676 m)   Wt 72.6 kg   SpO2 100%   BMI 25.83 kg/m   Physical Exam Vitals and nursing note reviewed.  Constitutional:      Appearance: He is well-developed.  HENT:     Head: Normocephalic and atraumatic.     Right Ear: External ear normal.     Left Ear: External ear normal.     Nose:     Right Nostril: No epistaxis.     Left  Nostril: Epistaxis present.     Mouth/Throat:     Comments: Some residual blood noted in oropharynx Eyes:     Conjunctiva/sclera: Conjunctivae normal.  Pulmonary:     Effort: No respiratory distress.  Musculoskeletal:     Cervical back: Normal range of motion and neck supple.  Skin:    General: Skin is warm and dry.  Neurological:     Mental Status: He is alert.     ED Results / Procedures / Treatments   Labs (all labs ordered are listed, but only abnormal results are displayed) Labs Reviewed - No data to display  EKG None  Radiology No results found.  Procedures .Epistaxis Management  Date/Time: 10/29/2022 9:58 AM  Performed by: Renne Crigler, PA-C Authorized by: Renne Crigler, PA-C   Consent:    Consent obtained:  Verbal   Consent given by:  Patient and healthcare agent   Risks discussed:  Pain and bleeding   Alternatives discussed:  No treatment Universal protocol:    Patient identity confirmed:  Verbally with patient and provided demographic data Procedure details:    Treatment site:  L anterior and L posterior   Treatment method:  Anterior pack and nasal balloon (Afrin/TXA)   Treatment episode: initial   Post-procedure details:    Procedure completion:  Tolerated well, no immediate complications Comments:     Afrin pledget --> failed, TXA pledget --> failed, 5.5cm rapid rhino placed     Medications Ordered in ED Medications  oxymetazoline (AFRIN) 0.05 % nasal spray 1 spray (1 spray Each Nare Given 10/29/22 0853)  tranexamic acid (CYKLOKAPRON) injection 500 mg (500 mg Topical Given by Other 10/29/22 0931)    ED Course/ Medical Decision Making/ A&P    Patient seen and examined. History obtained directly from patient and family member at bedside.  Also reviewed recent ED chart and hospitalization notes.  Labs/EKG: None ordered  Imaging: None ordered  Medications/Fluids: Afrin pledget placed on arrival currently in place  Most recent vital signs  reviewed and are as follows: BP (!) 152/80 (BP Location: Right Arm)   Pulse 75   Temp 98.4 F (36.9 C) (Oral)   Resp 20   Ht 5\' 6"  (1.676 m)   Wt 72.6 kg   SpO2 100%   BMI 25.83 kg/m   Initial impression: Left epistaxis  9:32 AM called back to room, bleeding restarted.  I removed Afrin soaked gauze and replaced with cottonball soaked with TXA.  10:35 AM patient continued to bleed after cottonball soaked with TXA was placed.  Decision made to place rapid Rhino.  5.5 cm rapid Rhino was placed and inflated.  This was rechecked and  additional inflation was performed.  Patient doing well without significant bleeding after 30 minutes.  Patient and family member are agreeable to discharge at this time.  They have a longstanding relationship with ENT, Dr. Jearld Fenton, and will try to follow-up with him in the upcoming week.  Reviewed pertinent lab work and imaging with patient at bedside. Questions answered.   Most current vital signs reviewed and are as follows: BP (!) 167/77 (BP Location: Right Arm)   Pulse 63   Temp 98.4 F (36.9 C) (Oral)   Resp 20   Ht 5\' 6"  (1.676 m)   Wt 72.6 kg   SpO2 100%   BMI 25.83 kg/m   Plan: Discharge  Prescriptions written for: None  Other home care instructions discussed: Care of nasal packing  ED return instructions discussed: Resuming bleeding  Follow-up instructions discussed: Patient encouraged to follow-up with ENT this upcoming week.                              Medical Decision Making Risk OTC drugs. Prescription drug management.   Patient with recurrent nosebleeds starting this morning.  Currently only on a baby aspirin.  Patient failed treatment with Afrin and TXA in the ED.  Rapid Rhino was placed with control of bleeding.  Vitals remained stable and no concern for significant blood loss at this time.  Patient has good support at home.  The patient's vital signs, pertinent lab work and imaging were reviewed and interpreted as  discussed in the ED course. Hospitalization was considered for further testing, treatments, or serial exams/observation. However as patient is well-appearing, has a stable exam, and reassuring studies today, I do not feel that they warrant admission at this time. This plan was discussed with the patient who verbalizes agreement and comfort with this plan and seems reliable and able to return to the Emergency Department with worsening or changing symptoms.          Final Clinical Impression(s) / ED Diagnoses Final diagnoses:  Epistaxis    Rx / DC Orders ED Discharge Orders     None         Renne Crigler, PA-C 10/29/22 1037    Horton, Clabe Seal, DO 10/29/22 1356

## 2022-10-29 NOTE — Discharge Instructions (Signed)
Please read and follow all provided instructions.  Your diagnoses today include:  1. Epistaxis    Tests performed today include: Vital signs. See below for your results today.   Medications prescribed:  None  Home care instructions:  Read the educational materials provided and follow any instructions contained in this packet.  If your nosebleed happens again in future: Pinch your nose and hold for 15 minutes without letting go.  If this does not stop the bleeding, pinch and hold for another 15 minutes.  If it continues to bleed after this, please come to the Emergency Department or see your family doctor.   Follow-up instructions: Please follow-up with your ENT for packing removal and recheck.   Return instructions:  Please return to the Emergency Department if you experience worsening symptoms.  Please return if you have any other emergent concerns.  Additional Information:  Your vital signs today were: BP (!) 152/80 (BP Location: Right Arm)   Pulse 75   Temp 98.4 F (36.9 C) (Oral)   Resp 20   Ht 5\' 6"  (1.676 m)   Wt 72.6 kg   SpO2 100%   BMI 25.83 kg/m  If your blood pressure (BP) was elevated above 135/85 this visit, please have this repeated by your doctor within one month. --------------

## 2022-10-29 NOTE — ED Notes (Addendum)
Patient given discharge instructions. Questions were answered. Patient verbalized understanding of discharge instructions and care at home.   Discharged with daughter

## 2022-11-06 DIAGNOSIS — Z961 Presence of intraocular lens: Secondary | ICD-10-CM | POA: Diagnosis not present

## 2022-11-06 DIAGNOSIS — I6789 Other cerebrovascular disease: Secondary | ICD-10-CM | POA: Diagnosis not present

## 2022-11-06 DIAGNOSIS — H353231 Exudative age-related macular degeneration, bilateral, with active choroidal neovascularization: Secondary | ICD-10-CM | POA: Diagnosis not present

## 2022-11-13 ENCOUNTER — Encounter: Payer: Self-pay | Admitting: Neurology

## 2022-11-13 ENCOUNTER — Ambulatory Visit: Payer: Medicare Other | Admitting: Neurology

## 2022-11-13 VITALS — BP 145/69 | HR 61 | Ht 68.0 in | Wt 153.5 lb

## 2022-11-13 DIAGNOSIS — I639 Cerebral infarction, unspecified: Secondary | ICD-10-CM | POA: Diagnosis not present

## 2022-11-13 DIAGNOSIS — I10 Essential (primary) hypertension: Secondary | ICD-10-CM | POA: Diagnosis not present

## 2022-11-13 DIAGNOSIS — G3184 Mild cognitive impairment, so stated: Secondary | ICD-10-CM | POA: Insufficient documentation

## 2022-11-13 NOTE — Patient Instructions (Addendum)
Continue follow up with primary care doctor  Strict management of vascular risk factors with a goal BP less than 130/90, A1c less than 7.0, LDL less than 70 for secondary stroke prevention

## 2022-11-13 NOTE — Progress Notes (Signed)
Patient: Adam Arellano Date of Birth: 1931-07-14  Reason for Visit: Follow up History from: Patient, daughter Primary Neurologist: Pearlean Brownie   ASSESSMENT AND PLAN 89 y.o. year old male with left posterior cerebral artery embolic infarct and October 2023 cryptogenic etiology.  He has mild cognitive impairment and mild right-sided peripheral vision loss.  Vascular risk factors of hypertension, hyperlipidemia and intracranial stenosis.    -Overall stable, no major changes, now lives with his daughter -Continue aspirin 81 mg daily for secondary stroke prevention -Strict management of vascular risk factors with a goal BP less than 130/90, A1c less than 7.0, LDL less than 70 for secondary stroke prevention -Encouraged exercise, drinking plenty of water, brain stimulating activity for brain health.  We discussed memory medication such as Namenda and Aricept.  Not confident he would see much benefit when weighed with potential side effects.  Can continue to monitor with PCP -Follow-up here on an as-needed basis  HISTORY OF PRESENT ILLNESS: Today 11/13/22 30-day cardiac monitor showed no A-fib.  Could not complete the MoCA due to vision trouble. Here with his daughter, [name]. He is now living with his daughter. Fall in January right hip fracture, underwent surgical repair. Off Plavix, just on aspirin 81 mg daily. Before the stroke was having mobility issues, now moving slower. Mentions memory trouble was present prior. Is not driving. BP 145/69 today. Short term memory is a challenge, asks the same questions about time, when the [employer] play. He would like to work in yard, he wants to [place], and drive the golf cart. Has macular degeneration. Vision is blurry, denies any black spots in vision. When he eats, takes him some time to grab it precisely. He sleeps well. Does his own ADLs. We talked about his lifetime work [employer], worked up until October before stroke. He has had a few  nosebleeds. They have seen ENT, question if occurred from straining for BM. Now on colace. He has a good mood and demeanor.   HISTORY  06/01/22  Dr. Pearlean Brownie: HPI: [name] is a 89 year old Caucasian male seen today for initial office consultation visit for stroke.  He is accompanied by his daughter [name] history is provided by them and review of electronic medical records.  I personally reviewed pertinent available imaging films in PACS.[name] is a 89 y.o. male with past medical history of transient global amnesia in 2009, hypertension, hyperlipidemia, coronary artery disease with NSTEMI status post multiple PCI's, prostate cancer status post radical prostatectomy, severe mitral regurgitation status post TEER ( 08/19/2020). Patient doesn't remember what happened yesterday.  Per daughter at bedside, she spoke with patient on Saturday 03/25/2019.  Around 4 PM when he was his usual self.  Yesterday afternoon Multifol patient initially did not pick up and when he did pick up he started confused.  Therefore daughter called EMS.  When EMS went to his house, he was laying in bed had some bruises and urinary incontinence.  He was brought to [facility], ED and admitted for further work-up.  CT head was done showed acute stroke and therefore neurology was consulted on 03/17/2022.  MRI scan showed acute left occipital infarct and MR angiogram showed occlusion of the left P2 posterior cerebral artery with potentially severe proximal right A2 anterior cerebral artery stenosis as well.  3 mm outpouching at left MCA bifurcation possible aneurysm.  Transthoracic echo showed ejection fraction of 55 to 60%.  LDL cholesterol was 69 mg percent.  Hemoglobin A1c was 5.5.  Patient is currently wearing a 30-day heart monitor to look for paroxysmal A-fib.  Patient was transferred to inpatient rehab and has done well and has been discharged home now.  Patient states she still has some peripheral vision loss but it appears  to be improving.  He is currently doing outpatient physical occupational and speech therapy.  His walking has improved.  He is living at home with his daughter and he has 24-hour care at home.  He is independent mostly in activities of daily living but needs some help with his meds.  He does also have macular degeneration which may also be responsible for his poor vision.  Patient had some mild memory difficulties prior to the stroke which now appear to have progressed.  He is having more cognitive difficulties.  He remains on aspirin and Plavix which is tolerating well with only minor bruising and no bleeding.  His blood pressure is under good control today it is 148/65.  REVIEW OF SYSTEMS: Out of a complete 14 system review of symptoms, the patient complains only of the following symptoms, and all other reviewed systems are negative.  See HPI  ALLERGIES: Allergies  Allergen Reactions   Simvastatin Other (See Comments)    MUSCLE ACHES REAL BAD   Atorvastatin     Other reaction(s): stiffness, decreased energy   Lisinopril Cough   Rosuvastatin     Other reaction(s): weakness    HOME MEDICATIONS: Outpatient Medications Prior to Visit  Medication Sig Dispense Refill   acetaminophen (TYLENOL) 325 MG tablet Take 1-2 tablets (325-650 mg total) by mouth every 4 (four) hours as needed for mild pain.     aspirin EC 81 MG tablet Take 81 mg by mouth daily. Swallow whole.     ciprofloxacin-dexamethasone (CIPRODEX) OTIC suspension Place 4 drops into the left ear 2 (two) times daily as needed (ear infection).     docusate sodium (COLACE) 100 MG capsule Take 1 capsule (100 mg total) by mouth 2 (two) times daily. 10 capsule 0   FIBER PO Take 1 capsule by mouth daily.     furosemide (LASIX) 20 MG tablet Take 1 tablet (20 mg total) by mouth daily. 90 tablet 3   gabapentin (NEURONTIN) 100 MG capsule Take 1 capsule (100 mg total) by mouth at bedtime. 30 capsule 0   losartan (COZAAR) 25 MG tablet TAKE 1  TABLET DAILY 90 tablet 3   Menthol-Methyl Salicylate (MUSCLE RUB) 10-15 % CREA Apply 1 Application topically 2 (two) times daily as needed for muscle pain.  0   metoprolol tartrate (LOPRESSOR) 25 MG tablet Take 25 mg by mouth 2 (two) times daily.     Multiple Vitamin (MULTIVITAMIN WITH MINERALS) TABS tablet Take 1 tablet by mouth daily.     nitroGLYCERIN (NITROSTAT) 0.4 MG SL tablet PLACE 1 TABLET UNDER THE TONGUE AT ONSET OF CHEST PAIN EVERY 5 MINTUES UP TO 3 TIMES AS NEEDED 25 tablet 3   polyethylene glycol (MIRALAX / GLYCOLAX) 17 g packet Take 17 g by mouth daily as needed for mild constipation. 14 each 0   Polyvinyl Alcohol-Povidone (REFRESH OP) Place 1 drop into both eyes daily as needed (dry eye).     clopidogrel (PLAVIX) 75 MG tablet Take 1 tablet (75 mg total) by mouth daily. 90 tablet 3   enoxaparin (LOVENOX) 30 MG/0.3ML injection Inject 0.3 mLs (30 mg total) into the skin daily for 21 doses. 6.3 mL 0   oxyCODONE (OXY IR/ROXICODONE) 5 MG immediate release tablet Take 0.5  tablets (2.5 mg total) by mouth every 4 (four) hours as needed for severe pain or moderate pain. 5 tablet 0   polyethylene glycol (MIRALAX / GLYCOLAX) 17 g packet Take 17 g by mouth daily. 30 each 2   No facility-administered medications prior to visit.    PAST MEDICAL HISTORY: Past Medical History:  Diagnosis Date   Asthma    a. Childhood.   Coronary artery disease    a. NSTEMI s/p DES to dRCA 2007. b. Angina 07/2009: s/p DESx2 to mid RCA for ISR and prox RCA, EF 65%. c. Stress test 05/2013: low risk, no evidence of ischemia, EF 61%.   CVA (cerebral vascular accident) (HCC)    Dyslipidemia    GERD (gastroesophageal reflux disease)    History of kidney stones    Hypertension    Peripheral vascular disease (HCC)    Prostate cancer (HCC)    a. s/p radical prostatectomy.   PVC's (premature ventricular contractions)    S/P mitral valve clip implantation 08/19/2020   s/p MitraClip implantation with a XTW by Dr.  Excell Seltzer    Transient global amnesia    a. Adm 2009: imaging nonacute, had resolved.    PAST SURGICAL HISTORY: Past Surgical History:  Procedure Laterality Date   CARDIAC CATHETERIZATION     COLONOSCOPY     CORONARY STENT INTERVENTION N/A 08/06/2020   Procedure: CORONARY STENT INTERVENTION;  Surgeon: Tonny Bollman, MD;  Location: [facility] INVASIVE CV LAB;  Service: Cardiovascular;  Laterality: N/A;   EYE SURGERY Bilateral    cataracts removed   HIP ARTHROPLASTY Right 07/03/2022   Procedure: ARTHROPLASTY BIPOLAR HIP (HEMIARTHROPLASTY);  Surgeon: Joen Laura, MD;  Location: [facility] ORS;  Service: Orthopedics;  Laterality: Right;   MASTOIDECTOMY     L ear   MITRAL VALVE REPAIR     MITRAL VALVE REPAIR N/A 08/19/2020   Procedure: MITRAL VALVE REPAIR;  Surgeon: Tonny Bollman, MD;  Location[facility] INVASIVE CV LAB;  Service: Cardiovascular;  Laterality: N/A;   PROSTATECTOMY     RIGHT/LEFT HEART CATH AND CORONARY ANGIOGRAPHY N/A 08/06/2020   Procedure: RIGHT/LEFT HEART CATH AND CORONARY ANGIOGRAPHY;  Surgeon: Tonny Bollman, MD;  Location: [facility] INVASIVE CV LAB;  Service: Cardiovascular;  Laterality: N/A;   TEE WITHOUT CARDIOVERSION N/A 08/06/2020   Procedure: TRANSESOPHAGEAL ECHOCARDIOGRAM (TEE);  Surgeon: Thurmon Fair, MD;  Location: [facility] ENDOSCOPY;  Service: Cardiovascular;  Laterality: N/A;   TEE WITHOUT CARDIOVERSION  08/19/2020   TEE WITHOUT CARDIOVERSION N/A 08/19/2020   Procedure: TRANSESOPHAGEAL ECHOCARDIOGRAM (TEE);  Surgeon: Tonny Bollman, MD;  Location: [facility] INVASIVE CV LAB;  Service: Cardiovascular;  Laterality: N/A;    FAMILY HISTORY: Family History  Problem Relation Age of Onset   CAD Neg Hx     SOCIAL HISTORY: Social History   Socioeconomic History   Marital status: Married    Spouse name: Not on file   Number of children: 2   Years of education: Not on file   Highest education level: Not on file  Occupational History   Not on file  Tobacco Use   Smoking status: Former     Types: Cigarettes    Quit date: 1980    Years since quitting: 44.4   Smokeless tobacco: Never   Tobacco comments:    Smoked for 15 years  Vaping Use   Vaping Use: Never used  Substance and Sexual Activity   Alcohol use: Not Currently    Comment: rare   Drug use: No   Sexual activity: Not Currently  Other Topics Concern   Not on file  Social History Narrative   Not on file   Social Determinants of Health   Financial Resource Strain: Not on file  Food Insecurity: No Food Insecurity (07/02/2022)   Hunger Vital Sign    Worried About Running Out of Food in the Last Year: Never true    Ran Out of Food in the Last Year: Never true  Transportation Needs: No Transportation Needs (07/02/2022)   PRAPARE - Administrator, Civil Service (Medical): No    Lack of Transportation (Non-Medical): No  Physical Activity: Not on file  Stress: Not on file  Social Connections: Not on file  Intimate Partner Violence: Not At Risk (07/02/2022)   Humiliation, Afraid, Rape, and Kick questionnaire    Fear of Current or Ex-Partner: No    Emotionally Abused: No    Physically Abused: No    Sexually Abused: No   PHYSICAL EXAM  Vitals:   11/13/22 1008  BP: (!) 145/69  Pulse: 61  Weight: 153 lb 8 oz (69.6 kg)  Height: 5\' 8"  (1.727 m)   Body mass index is 23.34 kg/m.  Generalized: Well developed, in no acute distress  Neurological examination  Mentation: Alert oriented to place, date of birth, situation. Follows all commands speech and language fluent Cranial nerve II-XII: Pupils were equal round reactive to light. Extraocular movements were full, visual field were full on confrontational test.  I did not detect any visual impairment.  Facial sensation and strength were normal. Head turning and shoulder shrug  were normal and symmetric. Motor: Decreased upper extremity strength due to bilateral rotator cuff tears, right hip flexion 4/5, left leg is normal Sensory: Reported decreased soft  touch sensation to the right leg Coordination: Cerebellar testing reveals good finger-nose-finger and heel-to-shin bilaterally.  Gait and station: Gait is wide-based, cautious, uses rolling walker. Reflexes: Deep tendon reflexes are symmetric and normal bilaterally.   DIAGNOSTIC DATA (LABS, IMAGING, TESTING) - I reviewed patient records, labs, notes, testing and imaging myself where available.  Lab Results  Component Value Date   WBC 8.2 07/13/2022   HGB 9.6 (L) 07/13/2022   HCT 30.9 (L) 07/13/2022   MCV 99.7 07/13/2022   PLT 448 (H) 07/13/2022      Component Value Date/Time   NA 135 07/13/2022 0416   NA 141 11/02/2021 1141   K 4.5 07/13/2022 0416   CL 103 07/13/2022 0416   CO2 24 07/13/2022 0416   GLUCOSE 104 (H) 07/13/2022 0416   BUN 34 (H) 07/13/2022 0416   BUN 34 (H) 11/02/2021 1141   CREATININE 1.51 (H) 07/13/2022 0416   CALCIUM 8.3 (L) 07/13/2022 0416   PROT 5.2 (L) 07/13/2022 0416   ALBUMIN 2.3 (L) 07/13/2022 0416   AST 14 (L) 07/13/2022 0416   ALT 13 07/13/2022 0416   ALKPHOS 54 07/13/2022 0416   BILITOT 0.5 07/13/2022 0416   GFRNONAA 44 (L) 07/13/2022 0416   GFRAA 45 (L) 07/28/2020 1518   Lab Results  Component Value Date   CHOL 123 04/03/2022   HDL 46 04/03/2022   LDLCALC 69 04/03/2022   TRIG 38 04/03/2022   CHOLHDL 2.7 04/03/2022   Lab Results  Component Value Date   HGBA1C 5.5 04/03/2022   Lab Results  Component Value Date   VITAMINB12 216 07/04/2022   No results found for: "TSH"  Margie Ege, AGNP-C, DNP 11/13/2022, 10:50 AM [facility] [address], [address] [place], [place] [address] [phone]

## 2022-11-27 ENCOUNTER — Encounter: Payer: Self-pay | Admitting: Cardiology

## 2022-11-27 ENCOUNTER — Ambulatory Visit: Payer: Medicare Other | Attending: Cardiology | Admitting: Cardiology

## 2022-12-12 DIAGNOSIS — Z1389 Encounter for screening for other disorder: Secondary | ICD-10-CM | POA: Diagnosis not present

## 2022-12-12 DIAGNOSIS — I6930 Unspecified sequelae of cerebral infarction: Secondary | ICD-10-CM | POA: Diagnosis not present

## 2022-12-12 DIAGNOSIS — I340 Nonrheumatic mitral (valve) insufficiency: Secondary | ICD-10-CM | POA: Diagnosis not present

## 2022-12-12 DIAGNOSIS — E7800 Pure hypercholesterolemia, unspecified: Secondary | ICD-10-CM | POA: Diagnosis not present

## 2022-12-12 DIAGNOSIS — I129 Hypertensive chronic kidney disease with stage 1 through stage 4 chronic kidney disease, or unspecified chronic kidney disease: Secondary | ICD-10-CM | POA: Diagnosis not present

## 2022-12-12 DIAGNOSIS — N1832 Chronic kidney disease, stage 3b: Secondary | ICD-10-CM | POA: Diagnosis not present

## 2022-12-12 DIAGNOSIS — Z87898 Personal history of other specified conditions: Secondary | ICD-10-CM | POA: Diagnosis not present

## 2022-12-12 DIAGNOSIS — R4701 Aphasia: Secondary | ICD-10-CM | POA: Diagnosis not present

## 2022-12-12 DIAGNOSIS — I69359 Hemiplegia and hemiparesis following cerebral infarction affecting unspecified side: Secondary | ICD-10-CM | POA: Diagnosis not present

## 2022-12-12 DIAGNOSIS — Z9989 Dependence on other enabling machines and devices: Secondary | ICD-10-CM | POA: Diagnosis not present

## 2022-12-12 DIAGNOSIS — H3530 Unspecified macular degeneration: Secondary | ICD-10-CM | POA: Diagnosis not present

## 2022-12-12 DIAGNOSIS — Z0000 Encounter for general adult medical examination without abnormal findings: Secondary | ICD-10-CM | POA: Diagnosis not present

## 2022-12-12 DIAGNOSIS — Z23 Encounter for immunization: Secondary | ICD-10-CM | POA: Diagnosis not present

## 2023-01-05 ENCOUNTER — Inpatient Hospital Stay (HOSPITAL_COMMUNITY)
Admission: EM | Admit: 2023-01-05 | Discharge: 2023-01-10 | DRG: 101 | Disposition: A | Discharge status: Skilled Nursing Facility | Payer: Medicare Other | Attending: Internal Medicine | Admitting: Internal Medicine

## 2023-01-05 ENCOUNTER — Other Ambulatory Visit: Payer: Self-pay

## 2023-01-05 ENCOUNTER — Emergency Department (HOSPITAL_COMMUNITY): Payer: Medicare Other

## 2023-01-05 ENCOUNTER — Encounter (HOSPITAL_COMMUNITY): Payer: Self-pay | Admitting: *Deleted

## 2023-01-05 ENCOUNTER — Observation Stay (HOSPITAL_COMMUNITY): Payer: Medicare Other

## 2023-01-05 DIAGNOSIS — Z96641 Presence of right artificial hip joint: Secondary | ICD-10-CM | POA: Diagnosis present

## 2023-01-05 DIAGNOSIS — D72829 Elevated white blood cell count, unspecified: Secondary | ICD-10-CM | POA: Diagnosis present

## 2023-01-05 DIAGNOSIS — Z79899 Other long term (current) drug therapy: Secondary | ICD-10-CM

## 2023-01-05 DIAGNOSIS — I671 Cerebral aneurysm, nonruptured: Secondary | ICD-10-CM | POA: Diagnosis not present

## 2023-01-05 DIAGNOSIS — R4701 Aphasia: Secondary | ICD-10-CM | POA: Diagnosis present

## 2023-01-05 DIAGNOSIS — I739 Peripheral vascular disease, unspecified: Secondary | ICD-10-CM | POA: Diagnosis not present

## 2023-01-05 DIAGNOSIS — K219 Gastro-esophageal reflux disease without esophagitis: Secondary | ICD-10-CM | POA: Diagnosis not present

## 2023-01-05 DIAGNOSIS — I69351 Hemiplegia and hemiparesis following cerebral infarction affecting right dominant side: Secondary | ICD-10-CM | POA: Diagnosis not present

## 2023-01-05 DIAGNOSIS — R569 Unspecified convulsions: Principal | ICD-10-CM | POA: Diagnosis present

## 2023-01-05 DIAGNOSIS — N1832 Chronic kidney disease, stage 3b: Secondary | ICD-10-CM | POA: Diagnosis present

## 2023-01-05 DIAGNOSIS — N189 Chronic kidney disease, unspecified: Secondary | ICD-10-CM | POA: Diagnosis not present

## 2023-01-05 DIAGNOSIS — R9089 Other abnormal findings on diagnostic imaging of central nervous system: Secondary | ICD-10-CM | POA: Diagnosis not present

## 2023-01-05 DIAGNOSIS — Z955 Presence of coronary angioplasty implant and graft: Secondary | ICD-10-CM | POA: Diagnosis not present

## 2023-01-05 DIAGNOSIS — I10 Essential (primary) hypertension: Secondary | ICD-10-CM

## 2023-01-05 DIAGNOSIS — Z87442 Personal history of urinary calculi: Secondary | ICD-10-CM

## 2023-01-05 DIAGNOSIS — Z66 Do not resuscitate: Secondary | ICD-10-CM | POA: Diagnosis not present

## 2023-01-05 DIAGNOSIS — E785 Hyperlipidemia, unspecified: Secondary | ICD-10-CM | POA: Diagnosis present

## 2023-01-05 DIAGNOSIS — I6782 Cerebral ischemia: Secondary | ICD-10-CM | POA: Diagnosis not present

## 2023-01-05 DIAGNOSIS — R6889 Other general symptoms and signs: Secondary | ICD-10-CM | POA: Diagnosis not present

## 2023-01-05 DIAGNOSIS — T4275XA Adverse effect of unspecified antiepileptic and sedative-hypnotic drugs, initial encounter: Secondary | ICD-10-CM | POA: Diagnosis not present

## 2023-01-05 DIAGNOSIS — I129 Hypertensive chronic kidney disease with stage 1 through stage 4 chronic kidney disease, or unspecified chronic kidney disease: Secondary | ICD-10-CM | POA: Diagnosis present

## 2023-01-05 DIAGNOSIS — I672 Cerebral atherosclerosis: Secondary | ICD-10-CM | POA: Diagnosis not present

## 2023-01-05 DIAGNOSIS — R4182 Altered mental status, unspecified: Secondary | ICD-10-CM

## 2023-01-05 DIAGNOSIS — I340 Nonrheumatic mitral (valve) insufficiency: Secondary | ICD-10-CM | POA: Diagnosis present

## 2023-01-05 DIAGNOSIS — I2510 Atherosclerotic heart disease of native coronary artery without angina pectoris: Secondary | ICD-10-CM | POA: Diagnosis not present

## 2023-01-05 DIAGNOSIS — Z87891 Personal history of nicotine dependence: Secondary | ICD-10-CM | POA: Diagnosis not present

## 2023-01-05 DIAGNOSIS — Z9889 Other specified postprocedural states: Secondary | ICD-10-CM | POA: Diagnosis not present

## 2023-01-05 DIAGNOSIS — R29818 Other symptoms and signs involving the nervous system: Secondary | ICD-10-CM | POA: Diagnosis not present

## 2023-01-05 DIAGNOSIS — H353113 Nonexudative age-related macular degeneration, right eye, advanced atrophic without subfoveal involvement: Secondary | ICD-10-CM | POA: Diagnosis present

## 2023-01-05 DIAGNOSIS — G928 Other toxic encephalopathy: Secondary | ICD-10-CM | POA: Diagnosis not present

## 2023-01-05 DIAGNOSIS — I252 Old myocardial infarction: Secondary | ICD-10-CM | POA: Diagnosis not present

## 2023-01-05 DIAGNOSIS — H353110 Nonexudative age-related macular degeneration, right eye, stage unspecified: Secondary | ICD-10-CM | POA: Diagnosis present

## 2023-01-05 DIAGNOSIS — D631 Anemia in chronic kidney disease: Secondary | ICD-10-CM | POA: Diagnosis not present

## 2023-01-05 DIAGNOSIS — Z8546 Personal history of malignant neoplasm of prostate: Secondary | ICD-10-CM | POA: Diagnosis not present

## 2023-01-05 DIAGNOSIS — R404 Transient alteration of awareness: Secondary | ICD-10-CM | POA: Diagnosis not present

## 2023-01-05 DIAGNOSIS — H353123 Nonexudative age-related macular degeneration, left eye, advanced atrophic without subfoveal involvement: Secondary | ICD-10-CM | POA: Diagnosis present

## 2023-01-05 DIAGNOSIS — I6523 Occlusion and stenosis of bilateral carotid arteries: Secondary | ICD-10-CM | POA: Diagnosis not present

## 2023-01-05 DIAGNOSIS — Z7982 Long term (current) use of aspirin: Secondary | ICD-10-CM

## 2023-01-05 DIAGNOSIS — G3184 Mild cognitive impairment, so stated: Secondary | ICD-10-CM

## 2023-01-05 DIAGNOSIS — I499 Cardiac arrhythmia, unspecified: Secondary | ICD-10-CM | POA: Diagnosis not present

## 2023-01-05 DIAGNOSIS — N179 Acute kidney failure, unspecified: Secondary | ICD-10-CM | POA: Diagnosis not present

## 2023-01-05 DIAGNOSIS — R000 Tachycardia, unspecified: Secondary | ICD-10-CM | POA: Diagnosis not present

## 2023-01-05 DIAGNOSIS — C61 Malignant neoplasm of prostate: Secondary | ICD-10-CM | POA: Diagnosis present

## 2023-01-05 DIAGNOSIS — J45909 Unspecified asthma, uncomplicated: Secondary | ICD-10-CM | POA: Diagnosis present

## 2023-01-05 DIAGNOSIS — R531 Weakness: Secondary | ICD-10-CM | POA: Diagnosis not present

## 2023-01-05 DIAGNOSIS — Z8673 Personal history of transient ischemic attack (TIA), and cerebral infarction without residual deficits: Secondary | ICD-10-CM

## 2023-01-05 DIAGNOSIS — Z9079 Acquired absence of other genital organ(s): Secondary | ICD-10-CM | POA: Diagnosis not present

## 2023-01-05 DIAGNOSIS — E876 Hypokalemia: Secondary | ICD-10-CM | POA: Diagnosis not present

## 2023-01-05 DIAGNOSIS — G9389 Other specified disorders of brain: Secondary | ICD-10-CM | POA: Diagnosis not present

## 2023-01-05 DIAGNOSIS — Z743 Need for continuous supervision: Secondary | ICD-10-CM | POA: Diagnosis not present

## 2023-01-05 DIAGNOSIS — Z9181 History of falling: Secondary | ICD-10-CM

## 2023-01-05 DIAGNOSIS — H518 Other specified disorders of binocular movement: Secondary | ICD-10-CM | POA: Diagnosis present

## 2023-01-05 DIAGNOSIS — Z888 Allergy status to other drugs, medicaments and biological substances status: Secondary | ICD-10-CM

## 2023-01-05 LAB — APTT: aPTT: 27 seconds (ref 24–36)

## 2023-01-05 LAB — RAPID URINE DRUG SCREEN, HOSP PERFORMED
Amphetamines: NOT DETECTED
Barbiturates: NOT DETECTED
Benzodiazepines: POSITIVE — AB
Cocaine: NOT DETECTED
Opiates: NOT DETECTED
Tetrahydrocannabinol: NOT DETECTED

## 2023-01-05 LAB — DIFFERENTIAL
Abs Immature Granulocytes: 0.04 10*3/uL (ref 0.00–0.07)
Basophils Absolute: 0.1 10*3/uL (ref 0.0–0.1)
Basophils Relative: 1 %
Eosinophils Absolute: 0.2 10*3/uL (ref 0.0–0.5)
Eosinophils Relative: 2 %
Immature Granulocytes: 0 %
Lymphocytes Relative: 38 %
Lymphs Abs: 4.5 10*3/uL — ABNORMAL HIGH (ref 0.7–4.0)
Monocytes Absolute: 0.9 10*3/uL (ref 0.1–1.0)
Monocytes Relative: 7 %
Neutro Abs: 6.2 10*3/uL (ref 1.7–7.7)
Neutrophils Relative %: 52 %

## 2023-01-05 LAB — CBC
HCT: 35.6 % — ABNORMAL LOW (ref 39.0–52.0)
Hemoglobin: 10.8 g/dL — ABNORMAL LOW (ref 13.0–17.0)
MCH: 29.3 pg (ref 26.0–34.0)
MCHC: 30.3 g/dL (ref 30.0–36.0)
MCV: 96.5 fL (ref 80.0–100.0)
Platelets: 312 10*3/uL (ref 150–400)
RBC: 3.69 MIL/uL — ABNORMAL LOW (ref 4.22–5.81)
RDW: 13.1 % (ref 11.5–15.5)
WBC: 11.8 10*3/uL — ABNORMAL HIGH (ref 4.0–10.5)
nRBC: 0 % (ref 0.0–0.2)

## 2023-01-05 LAB — PROTIME-INR
INR: 1.1 (ref 0.8–1.2)
Prothrombin Time: 13.9 seconds (ref 11.4–15.2)

## 2023-01-05 LAB — URINALYSIS, ROUTINE W REFLEX MICROSCOPIC
Bacteria, UA: NONE SEEN
Bilirubin Urine: NEGATIVE
Glucose, UA: NEGATIVE mg/dL
Ketones, ur: NEGATIVE mg/dL
Leukocytes,Ua: NEGATIVE
Nitrite: NEGATIVE
Protein, ur: NEGATIVE mg/dL
Specific Gravity, Urine: 1.018 (ref 1.005–1.030)
pH: 6 (ref 5.0–8.0)

## 2023-01-05 LAB — COMPREHENSIVE METABOLIC PANEL
ALT: 11 U/L (ref 0–44)
AST: 17 U/L (ref 15–41)
Albumin: 3.3 g/dL — ABNORMAL LOW (ref 3.5–5.0)
Alkaline Phosphatase: 81 U/L (ref 38–126)
Anion gap: 12 (ref 5–15)
BUN: 29 mg/dL — ABNORMAL HIGH (ref 8–23)
CO2: 17 mmol/L — ABNORMAL LOW (ref 22–32)
Calcium: 8.4 mg/dL — ABNORMAL LOW (ref 8.9–10.3)
Chloride: 107 mmol/L (ref 98–111)
Creatinine, Ser: 1.75 mg/dL — ABNORMAL HIGH (ref 0.61–1.24)
GFR, Estimated: 37 mL/min — ABNORMAL LOW (ref >60)
Glucose, Bld: 129 mg/dL — ABNORMAL HIGH (ref 70–99)
Potassium: 3.7 mmol/L (ref 3.5–5.1)
Sodium: 136 mmol/L (ref 135–145)
Total Bilirubin: 0.2 mg/dL — ABNORMAL LOW (ref 0.3–1.2)
Total Protein: 6.3 g/dL — ABNORMAL LOW (ref 6.5–8.1)

## 2023-01-05 LAB — CBG MONITORING, ED: Glucose-Capillary: 114 mg/dL — ABNORMAL HIGH (ref 70–99)

## 2023-01-05 LAB — ETHANOL: Alcohol, Ethyl (B): <10 mg/dL (ref <10)

## 2023-01-05 MED ORDER — TENECTEPLASE FOR STROKE
PACK | INTRAVENOUS | Status: AC
  Filled 2023-01-05: qty 10

## 2023-01-05 MED ORDER — ENOXAPARIN SODIUM 40 MG/0.4ML IJ SOSY: 40.0000 mg | PREFILLED_SYRINGE | INTRAMUSCULAR | Status: DC

## 2023-01-05 MED ORDER — LEVETIRACETAM IN NACL 1500 MG/100ML IV SOLN
1500.0000 mg | Freq: Once | INTRAVENOUS | Status: AC
  Administered 2023-01-05: 1500 mg via INTRAVENOUS
  Filled 2023-01-05: qty 100

## 2023-01-05 MED ORDER — LEVETIRACETAM IN NACL 1500 MG/100ML IV SOLN
1500.0000 mg | Freq: Once | INTRAVENOUS | Status: AC
  Administered 2023-01-05: 1500 mg via INTRAVENOUS

## 2023-01-05 MED ORDER — LORAZEPAM 2 MG/ML IJ SOLN: 2.0000 mg | INTRAMUSCULAR | Status: DC | PRN

## 2023-01-05 MED ORDER — SODIUM CHLORIDE 0.9 % IV SOLN
750.0000 mg | Freq: Two times a day (BID) | INTRAVENOUS | Status: DC
  Administered 2023-01-06: 750 mg via INTRAVENOUS
  Filled 2023-01-05 (×6): qty 7.5

## 2023-01-05 MED ORDER — SODIUM CHLORIDE 0.9 % IV SOLN
4000.0000 mg | Freq: Once | INTRAVENOUS | Status: DC
Start: 2023-01-05 — End: 2023-01-05

## 2023-01-05 MED ORDER — IOHEXOL 350 MG/ML SOLN
75.0000 mL | Freq: Once | INTRAVENOUS | Status: AC | PRN
  Administered 2023-01-05: 75 mL via INTRAVENOUS

## 2023-01-05 MED ORDER — SODIUM CHLORIDE 0.9% FLUSH
3.0000 mL | Freq: Two times a day (BID) | INTRAVENOUS | Status: DC
  Administered 2023-01-05 – 2023-01-10 (×10): 3 mL via INTRAVENOUS

## 2023-01-05 MED ORDER — LORAZEPAM 2 MG/ML IJ SOLN
1.0000 mg | Freq: Once | INTRAMUSCULAR | Status: AC
  Administered 2023-01-05: 1 mg via INTRAVENOUS
  Filled 2023-01-05: qty 1

## 2023-01-05 MED ORDER — LEVETIRACETAM 500 MG/5ML IV SOLN
750.0000 mg | Freq: Once | INTRAVENOUS | Status: DC
Start: 2023-01-05 — End: 2023-01-05

## 2023-01-05 MED ORDER — LEVETIRACETAM IN NACL 1000 MG/100ML IV SOLN
1000.0000 mg | Freq: Once | INTRAVENOUS | Status: AC
  Administered 2023-01-05: 1000 mg via INTRAVENOUS
  Filled 2023-01-05: qty 100

## 2023-01-05 MED ORDER — LORAZEPAM 2 MG/ML IJ SOLN
2.0000 mg | Freq: Once | INTRAMUSCULAR | Status: AC
  Administered 2023-01-05: 2 mg via INTRAVENOUS
  Filled 2023-01-05: qty 1

## 2023-01-05 MED ORDER — POLYETHYLENE GLYCOL 3350 17 G PO PACK: 17.0000 g | PACK | Freq: Every day | ORAL | Status: DC | PRN

## 2023-01-05 MED ORDER — ACETAMINOPHEN 325 MG PO TABS
650.0000 mg | ORAL_TABLET | Freq: Four times a day (QID) | ORAL | Status: DC | PRN
  Administered 2023-01-07: 650 mg via ORAL
  Filled 2023-01-05: qty 2

## 2023-01-05 MED ORDER — LORAZEPAM 2 MG/ML IJ SOLN: 2.0000 mg | Freq: Once | INTRAMUSCULAR | Status: DC

## 2023-01-05 MED ORDER — ACETAMINOPHEN 650 MG RE SUPP: 650.0000 mg | Freq: Four times a day (QID) | RECTAL | Status: DC | PRN

## 2023-01-05 NOTE — ED Notes (Signed)
Seizure pads, bed alarm, and fall mat placed for precautions. Suction set up at bedside for precaution.

## 2023-01-05 NOTE — H&P (Signed)
History and Physical   Adam Arellano:[id number] DOB: 1931-11-25 DOA: 01/05/2023  PCP: Delma Officer, PA   Patient coming from: Home  Chief Complaint: Neurodeficits  HPI: Adam Arellano is a 89 y.o. male with medical history significant of stroke, anemia, asthma, PVD, CAD, LPR, hypertension, CKD 3B, macular degeneration, mild cognitive impairment, mitral valve regurgitation status post repair, prostate cancer status post prostatectomy presenting with neurodeficits.  History obtained with assistance of chart review and family.  Around 10 AM today family noticed patient was unable to communicate.  EMS was called.  On EMS arrival patient was noted to have right gaze preference and not following commands.  Code stroke was called.  And route patient had a tonic-clonic seizure and received Versed and bag valve ventilations by EMS.  Neurology saw patient in ED at [facility] patient received Ativan and was recommended to transfer to [name] for stat EEG and plan for Keppra load.  Keppra load not given before transfer patient transferred to [facility], ED and received 4 g IV Keppra load and started on maintenance Keppra.  Patient unable to participate in review of systems due to altered mental status.  ED Course: Vital signs in the ED notable for blood pressure in the 140s to 160s systolic.  Lab workup included CMP with bicarb 17, BUN 28, creatinine stable 1.75, glucose 129, calcium 8.4, protein 6.3, albumin 3.3.  CBC with hemoglobin stable at 10.8, leukocytosis 11.8.  PT, PTT, INR all within normal limits.  Ethanol level negative.  UDS pending.  Urinalysis with hemoglobin only.  CT head showed no acute abnormality and showed remote infarct.  CTA head and neck showed no large vessel occlusion, right carotid stenosis of 70 to 80%, diminutive left P2, 3 mm stable MCA bifurcation aneurysm.  MR brain showed no acute abnormality but it was motion degradation and did notice a change that could be  consistent with infarct versus artifact and neurology feels this is most likely artifact.  Patient received above Keppra and Ativan in the ED.  Neurology saw patient at [facility] as above and has been consulted and was current as well, recommending stepdown admission.  Review of Systems: Unable to be obtained due to patient's altered mental status.  Past Medical History:  Diagnosis Date   Acute ischemic left posterior cerebral artery (PCA) stroke (HCC) 04/07/2022   Asthma    a. Childhood.   Closed right hip fracture (HCC) 07/01/2022   Coronary artery disease    a. NSTEMI s/p DES to dRCA 2007. b. Angina 07/2009: s/p DESx2 to mid RCA for ISR and prox RCA, EF 65%. c. Stress test 05/2013: low risk, no evidence of ischemia, EF 61%.   CVA (cerebral vascular accident) (HCC)    Dyslipidemia    Fall at home, initial encounter 04/02/2022   GERD (gastroesophageal reflux disease)    History of kidney stones    Hypertension    Peripheral vascular disease (HCC)    Prostate cancer (HCC)    a. s/p radical prostatectomy.   PVC's (premature ventricular contractions)    S/P mitral valve clip implantation 08/19/2020   s/p MitraClip implantation with a XTW by Dr. Excell Seltzer    Transient global amnesia    a. Adm 2009: imaging nonacute, had resolved.    Past Surgical History:  Procedure Laterality Date   CARDIAC CATHETERIZATION     COLONOSCOPY     CORONARY STENT INTERVENTION N/A 08/06/2020   Procedure: CORONARY STENT INTERVENTION;  Surgeon: Excell Seltzer,  Casimiro Needle, MD;  Location: [facility] INVASIVE CV LAB;  Service: Cardiovascular;  Laterality: N/A;   EYE SURGERY Bilateral    cataracts removed   HIP ARTHROPLASTY Right 07/03/2022   Procedure: ARTHROPLASTY BIPOLAR HIP (HEMIARTHROPLASTY);  Surgeon: Joen Laura, MD;  Location: [facility] ORS;  Service: Orthopedics;  Laterality: Right;   MASTOIDECTOMY     L ear   MITRAL VALVE REPAIR     MITRAL VALVE REPAIR N/A 08/19/2020   Procedure: MITRAL VALVE REPAIR;  Surgeon: Tonny Bollman, MD;  Location: [facility] INVASIVE CV LAB;  Service: Cardiovascular;  Laterality: N/A;   PROSTATECTOMY     RIGHT/LEFT HEART CATH AND CORONARY ANGIOGRAPHY N/A 08/06/2020   Procedure: RIGHT/LEFT HEART CATH AND CORONARY ANGIOGRAPHY;  Surgeon: Tonny Bollman, MD;  Location: [facility] INVASIVE CV LAB;  Service: Cardiovascular;  Laterality: N/A;   TEE WITHOUT CARDIOVERSION N/A 08/06/2020   Procedure: TRANSESOPHAGEAL ECHOCARDIOGRAM (TEE);  Surgeon: Thurmon Fair, MD;  Location: [facility] ENDOSCOPY;  Service: Cardiovascular;  Laterality: N/A;   TEE WITHOUT CARDIOVERSION  08/19/2020   TEE WITHOUT CARDIOVERSION N/A 08/19/2020   Procedure: TRANSESOPHAGEAL ECHOCARDIOGRAM (TEE);  Surgeon: Tonny Bollman, MD;  Location: [facility] INVASIVE CV LAB;  Service: Cardiovascular;  Laterality: N/A;    Social History  reports that he quit smoking about 44 years ago. His smoking use included cigarettes. He has never used smokeless tobacco. He reports that he does not currently use alcohol. He reports that he does not use drugs.  Allergies  Allergen Reactions   Simvastatin Other (See Comments)    MUSCLE ACHES REAL BAD   Atorvastatin     Other reaction(s): stiffness, decreased energy   Lisinopril Cough   Rosuvastatin     Other reaction(s): weakness    Family History  Problem Relation Age of Onset   CAD Neg Hx   Reviewed on admission  Prior to Admission medications   Medication Sig Start Date End Date Taking? Authorizing Provider  acetaminophen (TYLENOL) 325 MG tablet Take 1-2 tablets (325-650 mg total) by mouth every 4 (four) hours as needed for mild pain. 04/21/22  Yes Setzer, Lynnell Jude, PA-C  aspirin EC 81 MG tablet Take 81 mg by mouth daily. Swallow whole.   Yes [provider]  FIBER PO Take 1 capsule by mouth daily.   Yes [provider]  furosemide (LASIX) 20 MG tablet Take 1 tablet (20 mg total) by mouth daily. 05/19/22  Yes Jake Bathe, MD  losartan (COZAAR) 25 MG tablet TAKE 1 TABLET DAILY 01/16/22   Yes Jake Bathe, MD  metoprolol tartrate (LOPRESSOR) 25 MG tablet Take 25 mg by mouth 2 (two) times daily. 10/24/13  Yes Jake Bathe, MD  Multiple Vitamin (MULTIVITAMIN WITH MINERALS) TABS tablet Take 1 tablet by mouth daily.   Yes [provider]  nitroGLYCERIN (NITROSTAT) 0.4 MG SL tablet PLACE 1 TABLET UNDER THE TONGUE AT ONSET OF CHEST PAIN EVERY 5 MINTUES UP TO 3 TIMES AS NEEDED Patient taking differently: Place 0.4 mg under the tongue every 5 (five) minutes as needed for chest pain. PLACE 1 TABLET UNDER THE TONGUE AT ONSET OF CHEST PAIN EVERY 5 MINTUES UP TO 3 TIMES AS NEEDED 08/03/21  Yes Janetta Hora, PA-C  Polyvinyl Alcohol-Povidone (REFRESH OP) Place 1 drop into both eyes daily as needed (dry eye).   Yes [provider]  docusate sodium (COLACE) 100 MG capsule Take 1 capsule (100 mg total) by mouth 2 (two) times daily. Patient not taking: Reported on 01/05/2023 04/21/22  Setzer, Lynnell Jude, PA-C  gabapentin (NEURONTIN) 100 MG capsule Take 1 capsule (100 mg total) by mouth at bedtime. Patient not taking: Reported on 01/05/2023 04/21/22   Milinda Antis, PA-C  polyethylene glycol (MIRALAX / GLYCOLAX) 17 g packet Take 17 g by mouth daily as needed for mild constipation. Patient not taking: Reported on 01/05/2023 04/21/22   Milinda Antis, PA-C    Physical Exam: Vitals:   01/05/23 1400 01/05/23 1415 01/05/23 1630 01/05/23 1752  BP: (!) 145/78 (!) 154/74 (!) 161/73   Pulse: 86 85 85   Resp: 18 18 19    Temp:    98.1 F (36.7 C)  TempSrc:    Axillary  SpO2: 94% 95% 100%     Physical Exam Constitutional:      General: He is not in acute distress.    Appearance: Normal appearance.  HENT:     Head: Normocephalic and atraumatic.     Mouth/Throat:     Mouth: Mucous membranes are moist.     Pharynx: Oropharynx is clear.  Eyes:     Extraocular Movements: Extraocular movements intact.     Pupils: Pupils are equal, round, and reactive to light.   Cardiovascular:     Rate and Rhythm: Normal rate and regular rhythm.     Pulses: Normal pulses.     Heart sounds: Normal heart sounds.  Pulmonary:     Effort: Pulmonary effort is normal. No respiratory distress.     Breath sounds: Normal breath sounds.  Abdominal:     General: Bowel sounds are normal. There is no distension.     Palpations: Abdomen is soft.     Tenderness: There is no abdominal tenderness.  Musculoskeletal:        General: No swelling or deformity.  Skin:    General: Skin is warm and dry.  Neurological:     Comments: Patient arousable but agitated and fidgety and pulling at things.  Currently has mittens on.    Labs on Admission: I have personally reviewed following labs and imaging studies  CBC: Recent Labs  Lab 01/05/23 1247  WBC 11.8*  NEUTROABS 6.2  HGB 10.8*  HCT 35.6*  MCV 96.5  PLT 312    Basic Metabolic Panel: Recent Labs  Lab 01/05/23 1247  NA 136  K 3.7  CL 107  CO2 17*  GLUCOSE 129*  BUN 29*  CREATININE 1.75*  CALCIUM 8.4*    GFR: CrCl cannot be calculated (Unknown ideal weight.).  Liver Function Tests: Recent Labs  Lab 01/05/23 1247  AST 17  ALT 11  ALKPHOS 81  BILITOT 0.2*  PROT 6.3*  ALBUMIN 3.3*    Urine analysis:    Component Value Date/Time   COLORURINE STRAW (A) 01/05/2023 1524   APPEARANCEUR CLEAR 01/05/2023 1524   LABSPEC 1.018 01/05/2023 1524   PHURINE 6.0 01/05/2023 1524   GLUCOSEU NEGATIVE 01/05/2023 1524   HGBUR SMALL (A) 01/05/2023 1524   BILIRUBINUR NEGATIVE 01/05/2023 1524   KETONESUR NEGATIVE 01/05/2023 1524   PROTEINUR NEGATIVE 01/05/2023 1524   UROBILINOGEN 1.0 08/26/2007 1932   NITRITE NEGATIVE 01/05/2023 1524   LEUKOCYTESUR NEGATIVE 01/05/2023 1524    Radiological Exams on Admission: MR BRAIN WO CONTRAST  Result Date: 01/05/2023 CLINICAL DATA:  Code stroke EXAM: MRI HEAD WITHOUT CONTRAST TECHNIQUE: Multiplanar, multiecho pulse sequences of the brain and surrounding structures were  obtained without intravenous contrast. COMPARISON:  Same-day CT head, brain MRI 04/03/2022 FINDINGS: A truncated protocol was performed. Motion degraded DWI and FLAIR  sequences were obtained. Brain: There is no large vessel territorial infarct. There is mildly elevated DWI signal in the left thalamus along the left lateral ventricle which may be artifactual or a small acute infarct. The axial FLAIR sequence is markedly motion degraded. There is parenchymal volume loss with prominence of the ventricular system and extra-axial CSF spaces. There is a remote left PCA infarct, better seen on same-day CT. There is no midline shift. Vascular: Not well assessed. Skull and upper cervical spine: Not well assessed. Sinuses/Orbits: The imaged paranasal sinuses are clear. The globes and orbits are not well assessed. Other: None. IMPRESSION: 1. Truncated study with markedly motion degraded DWI and FLAIR sequences obtained. 2. Possible small acute infarct versus artifact in the left thalamus. Electronically Signed   By: Lesia Hausen M.D.   On: 01/05/2023 14:07   CT ANGIO HEAD NECK W WO CM (CODE STROKE)  Result Date: 01/05/2023 CLINICAL DATA:  Right-sided weakness and aphasia EXAM: CT ANGIOGRAPHY HEAD AND NECK WITH AND WITHOUT CONTRAST TECHNIQUE: Multidetector CT imaging of the head and neck was performed using the standard protocol during bolus administration of intravenous contrast. Multiplanar CT image reconstructions and MIPs were obtained to evaluate the vascular anatomy. Carotid stenosis measurements (when applicable) are obtained utilizing NASCET criteria, using the distal internal carotid diameter as the denominator. RADIATION DOSE REDUCTION: This exam was performed according to the departmental dose-optimization program which includes automated exposure control, adjustment of the mA and/or kV according to patient size and/or use of iterative reconstruction technique. CONTRAST:  75mL OMNIPAQUE IOHEXOL 350 MG/ML SOLN  COMPARISON:  MRA/MR head 04/03/2022 FINDINGS: CTA NECK FINDINGS Aortic arch: There is calcified plaque in the imaged aortic arch. The origins of the major branch vessels are patent. The subclavian arteries are patent to the level imaged. Right carotid system: The right common carotid artery is patent. There is bulky plaque in the proximal internal carotid artery resulting in up to approximately 70-80% stenosis. The distal internal carotid artery is patent. The external carotid artery is patent. There is no evidence of dissection or aneurysm. Left carotid system: The left common carotid artery is patent with mixed plaque resulting in up to approximately 60% stenosis. There is mild plaque at the bifurcation without significant stenosis of the internal carotid artery. The external carotid artery is patent. There is no evidence of dissection or aneurysm. Vertebral arteries: The vertebral arteries are patent, without hemodynamically significant stenosis or occlusion there is no evidence of dissection or aneurysm. Skeleton: There is no acute osseous abnormality or suspicious osseous lesion. There is no visible canal hematoma. There is advanced multilevel degenerative change in the cervical spine without evidence of high-grade spinal canal stenosis. Other neck: The soft tissues of the neck are unremarkable. Upper chest: There is biapical pleural scarring. Review of the MIP images confirms the above findings CTA HEAD FINDINGS Anterior circulation: There is calcified plaque in the intracranial ICAs without significant stenosis or occlusion. The bilateral MCAs are patent, without proximal stenosis or occlusion. The bilateral ACAS are patent, without proximal stenosis or occlusion. There is a 3 mm left MCA bifurcation aneurysm, unchanged. Posterior circulation: The bilateral V4 segments are patent. The basilar artery is patent. The major cerebellar arteries appear patent. The left PCA is diminutive beyond the P2 segment.  Otherwise, the PCAs are patent, without other proximal high-grade stenosis or occlusion. Posterior communicating arteries are not identified. There is no aneurysm or AVM. Venous sinuses: Patent. Anatomic variants: None. Review of the MIP images confirms the  above findings IMPRESSION: 1. No emergent large vessel occlusion. 2. Calcified plaque in the proximal right internal carotid artery resulting in approximately 70-80% stenosis. 3. Mixed plaque in the left common carotid artery resulting in approximately 60% stenosis. 4. Diminutive left PCA beyond the P2 segment. Otherwise, patent intracranial vasculature without other proximal high-grade stenosis or occlusion. 5. 3 mm left MCA bifurcation aneurysm, unchanged. Electronically Signed   By: Lesia Hausen M.D.   On: 01/05/2023 13:43   CT HEAD CODE STROKE WO CONTRAST  Result Date: 01/05/2023 CLINICAL DATA:  Code stroke.  Neuro deficit, acute, stroke suspected EXAM: CT HEAD WITHOUT CONTRAST TECHNIQUE: Contiguous axial images were obtained from the base of the skull through the vertex without intravenous contrast. RADIATION DOSE REDUCTION: This exam was performed according to the departmental dose-optimization program which includes automated exposure control, adjustment of the mA and/or kV according to patient size and/or use of iterative reconstruction technique. COMPARISON:  CT head October 29, 23. FINDINGS: Motion limited. Brain: Remote left PCA territory infarct. Remote left posterior limb of the internal capsule infarct. No evidence of acute large vascular territory infarct, acute hemorrhage, mass lesion or midline shift. Cerebral atrophy with ex vacuo ventricular dilation. Vascular: No hyperdense vessel. Skull: No acute fracture. Sinuses/Orbits: Clear sinuses.  No acute orbital findings. Other: No mastoid effusions. ASPECTS [facility] Stroke Program Early CT Score) Total score (0-10 with 10 being normal): 10. IMPRESSION: 1. No evidence of acute large vascular  territory infarct or acute hemorrhage. ASPECTS is 10. 2. Remote left PCA infarcts and chronic microvascular ischemic change. Code stroke imaging results were communicated on 01/05/2023 at 1:22 pm to provider Charm Barges via telephone, who verbally acknowledged these results. Electronically Signed   By: Feliberto Harts M.D.   On: 01/05/2023 13:24    EKG: Independently reviewed.  Sinus tachycardia at 104 bpm.  Nonspecific T wave changes, likely repolarization abnormality V3, V4, V5.  Nonspecific intraventricular conduction delay with QRS 119.  Assessment/Plan Principal Problem:   Seizure (HCC) Active Problems:   Primary hypertension   Nonrheumatic mitral valve regurgitation   H/O mitral valve repair   PVD (peripheral vascular disease) (HCC)   Coronary artery disease   Advanced nonexudative age-related macular degeneration of right eye without subfoveal involvement   Advanced nonexudative age-related macular degeneration of left eye without subfoveal involvement   Anemia in chronic kidney disease   Stage 3b chronic kidney disease (HCC)   History of malignant neoplasm of prostate   Laryngopharyngeal reflux (LPR)   History of CVA (cerebrovascular accident)   Asthma, chronic   H/O radical prostatectomy   Mild cognitive impairment   Seizures > Presented to [facility] after family noted patient was unable to communicate.  EMS noted right gaze preference with persisted in the ED.  Patient had a tonic-clonic seizure and route with EMS.  Received Versed by EMS and then Ativan in the ED. > Neurology saw patient and up in ED recommended transfer to [facility] for EEG and also recommended Keppra load.  Did not get Keppra prior to transfer but did get it on arrival to the [facility] and received 4 g Keppra load started on maintenance Keppra. > CT head showed no acute abnormality.  MR brain showed only artifact versus possible infarct and motion degraded exam neurology feels this is likely artifact. >  Improved, appears to have improvement/resolution of rightward gaze preference.  He is more alert but is agitated and fidgety and remain confused, not currently answering questions. > Neurology here  is seen patient and with some improvement feel patient is appropriate for progressive unit and does not currently need to go to the ICU. - Monitor on progressive unit - Appreciate neurology recommendations and assistance - Continue with IV Keppra - As needed Ativan for recurrent seizure - Single dose of Ativan to see if this helps with his agitation/nausea we will not add repeat as needed doses for agitation in case of paradoxical reaction - Seizure precautions - Swallow screen - Neurochecks - Supportive care  History of CVA - Continue home aspirin when able to tolerate p.o.  Hypertension - Holding antihypertensives until able to tolerate p.o.  CAD PVD - Holding ASA, metoprolol and losartan until learning p.o.  Anemia > Hemoglobin stable at 10.8 - Trend CBC  CKD 3b > Creatinine stable at 1.75 in ED. - Trend renal function and electrolytes  Mild cognitive impairment - Noted  Mitral valve regurgitation s/p repair - Noted  History of prostate cancer > Status post prostatectomy - Noted  DVT prophylaxis: Lovenox Code Status:   DNR/DNI Family Communication:  Updated at bedside Disposition Plan:   Patient is from:  Home  Anticipated DC to:  Pending clinical course  Anticipated DC date:  1 to 4 days  Anticipated DC barriers: None  Consults called:  Neurology Admission status:  Observation, progressive  Severity of Illness: The appropriate patient status for this patient is OBSERVATION. Observation status is judged to be reasonable and necessary in order to provide the required intensity of service to ensure the patient's safety. The patient's presenting symptoms, physical exam findings, and initial radiographic and laboratory data in the context of their medical condition is  felt to place them at decreased risk for further clinical deterioration. Furthermore, it is anticipated that the patient will be medically stable for discharge from the hospital within 2 midnights of admission.    Synetta Fail MD [facility]  How to contact the [facility] Attending or Consulting provider 7A - 7P or covering provider during after hours 7P -7A, for this patient?   Check the care team in [facility] and look for a) attending/consulting [facility] provider listed and b) the [facility] team listed Log into [email or web address] and use [facility] universal password to access. If you do not have the password, please contact the hospital operator. Locate the [facility] provider you are looking for under [facility] and page to a number that you can be directly reached. If you still have difficulty reaching the provider, please page the [facility] (Director on Call) for the Hospitalists listed on amion for assistance.  01/05/2023, 5:53 PM

## 2023-01-05 NOTE — ED Provider Notes (Signed)
[place] EMERGENCY DEPARTMENT AT [facility] Provider Note   CSN: [id number] Arrival date & time: 01/05/23  1243     History  No chief complaint on file.   Adam Arellano is a 89 y.o. male.  He has a prior stroke with a little bit of residual right-sided deficits.  POssibly on a blood thinner reportedly today at 10 AM he was not able to communicate with family.  EMS activated as code stroke for their evaluation.  They noticed right-sided gaze preference and not following commands.  He then had a tonic-clonic seizure.  Was given Versed and had some assisted bag valve ventilations.  Patient arrives to the department restless not following commands.  Appears to still have right-sided gaze preference.  Moving all extremities though.  The history is provided by the EMS personnel.  Seizures Seizure activity on arrival: no   Seizure type:  Grand mal Preceding symptoms comment:  Aphasia Initial focality:  Unable to specify Episode characteristics: generalized shaking   Postictal symptoms: confusion   Return to baseline: no   Timing:  Once Recent head injury:  Unable to specify PTA treatment:  Midazolam History of seizures: no        Home Medications Prior to Admission medications   Medication Sig Start Date End Date Taking? Authorizing Provider  acetaminophen (TYLENOL) 325 MG tablet Take 1-2 tablets (325-650 mg total) by mouth every 4 (four) hours as needed for mild pain. 04/21/22   Setzer, Lynnell Jude, PA-C  aspirin EC 81 MG tablet Take 81 mg by mouth daily. Swallow whole.    [provider]  ciprofloxacin-dexamethasone (CIPRODEX) OTIC suspension Place 4 drops into the left ear 2 (two) times daily as needed (ear infection).    [provider]  docusate sodium (COLACE) 100 MG capsule Take 1 capsule (100 mg total) by mouth 2 (two) times daily. 04/21/22   Setzer, Lynnell Jude, PA-C  FIBER PO Take 1 capsule by mouth daily.    [provider]  furosemide  (LASIX) 20 MG tablet Take 1 tablet (20 mg total) by mouth daily. 05/19/22   Jake Bathe, MD  gabapentin (NEURONTIN) 100 MG capsule Take 1 capsule (100 mg total) by mouth at bedtime. 04/21/22   Setzer, Lynnell Jude, PA-C  losartan (COZAAR) 25 MG tablet TAKE 1 TABLET DAILY 01/16/22   Jake Bathe, MD  Menthol-Methyl Salicylate (MUSCLE RUB) 10-15 % CREA Apply 1 Application topically 2 (two) times daily as needed for muscle pain. 04/21/22   Setzer, Lynnell Jude, PA-C  metoprolol tartrate (LOPRESSOR) 25 MG tablet Take 25 mg by mouth 2 (two) times daily. 10/24/13   Jake Bathe, MD  Multiple Vitamin (MULTIVITAMIN WITH MINERALS) TABS tablet Take 1 tablet by mouth daily.    [provider]  nitroGLYCERIN (NITROSTAT) 0.4 MG SL tablet PLACE 1 TABLET UNDER THE TONGUE AT ONSET OF CHEST PAIN EVERY 5 MINTUES UP TO 3 TIMES AS NEEDED 08/03/21   Janetta Hora, PA-C  polyethylene glycol (MIRALAX / GLYCOLAX) 17 g packet Take 17 g by mouth daily as needed for mild constipation. 04/21/22   Setzer, Lynnell Jude, PA-C  Polyvinyl Alcohol-Povidone (REFRESH OP) Place 1 drop into both eyes daily as needed (dry eye).    [provider]      Allergies    Simvastatin, Atorvastatin, Lisinopril, and Rosuvastatin    Review of Systems   Review of Systems  Unable to perform ROS: Mental status change  Neurological:  Positive for  seizures.    Physical Exam Updated Vital Signs BP (!) 161/73 (BP Location: Left Arm)   Pulse 85   Temp (!) 97.1 F (36.2 C) (Tympanic)   Resp 19   SpO2 100%  Physical Exam Vitals and nursing note reviewed.  Constitutional:      General: He is in acute distress.     Appearance: Normal appearance. He is well-developed.  HENT:     Head: Normocephalic and atraumatic.  Eyes:     Conjunctiva/sclera: Conjunctivae normal.  Cardiovascular:     Rate and Rhythm: Normal rate and regular rhythm.     Heart sounds: No murmur heard. Pulmonary:     Effort: Pulmonary effort is normal. No  respiratory distress.     Breath sounds: Normal breath sounds.  Abdominal:     Palpations: Abdomen is soft.     Tenderness: There is no abdominal tenderness. There is no guarding or rebound.  Musculoskeletal:        General: No deformity. Normal range of motion.     Cervical back: Neck supple.     Comments: He has a small area of bruising over his left shoulder.  Skin:    General: Skin is warm and dry.     Capillary Refill: Capillary refill takes less than 2 seconds.  Neurological:     Comments: Patient is not following commands, eyes closed.  Appears to have right gaze.  Moving all extremities with restless behavior.  Not speaking.     ED Results / Procedures / Treatments   Labs (all labs ordered are listed, but only abnormal results are displayed) Labs Reviewed  CBC - Abnormal; Notable for the following components:      Result Value   WBC 11.8 (*)    RBC 3.69 (*)    Hemoglobin 10.8 (*)    HCT 35.6 (*)    All other components within normal limits  DIFFERENTIAL - Abnormal; Notable for the following components:   Lymphs Abs 4.5 (*)    All other components within normal limits  COMPREHENSIVE METABOLIC PANEL - Abnormal; Notable for the following components:   CO2 17 (*)    Glucose, Bld 129 (*)    BUN 29 (*)    Creatinine, Ser 1.75 (*)    Calcium 8.4 (*)    Total Protein 6.3 (*)    Albumin 3.3 (*)    Total Bilirubin 0.2 (*)    GFR, Estimated 37 (*)    All other components within normal limits  URINALYSIS, ROUTINE W REFLEX MICROSCOPIC - Abnormal; Notable for the following components:   Color, Urine STRAW (*)    Hgb urine dipstick SMALL (*)    All other components within normal limits  CBG MONITORING, ED - Abnormal; Notable for the following components:   Glucose-Capillary 114 (*)    All other components within normal limits  ETHANOL  PROTIME-INR  APTT  RAPID URINE DRUG SCREEN, HOSP PERFORMED  I-STAT CHEM 8, ED    EKG EKG Interpretation Date/Time:  Friday January 05 2023 13:20:26 EDT Ventricular Rate:  104 PR Interval:  171 QRS Duration:  119 QT Interval:  357 QTC Calculation: 470 R Axis:   75  Text Interpretation: Sinus tachycardia Atrial premature complexes in couplets Nonspecific intraventricular conduction delay Borderline ST depression, anterolateral leads Baseline wander in lead(s) V4 new increased rate and nonspecific STs from prior 1/24 Confirmed by Meridee Score [phone]) on 01/05/2023 1:28:19 PM  Radiology MR BRAIN WO CONTRAST  Result Date: 01/05/2023 CLINICAL  DATA:  Code stroke EXAM: MRI HEAD WITHOUT CONTRAST TECHNIQUE: Multiplanar, multiecho pulse sequences of the brain and surrounding structures were obtained without intravenous contrast. COMPARISON:  Same-day CT head, brain MRI 04/03/2022 FINDINGS: A truncated protocol was performed. Motion degraded DWI and FLAIR sequences were obtained. Brain: There is no large vessel territorial infarct. There is mildly elevated DWI signal in the left thalamus along the left lateral ventricle which may be artifactual or a small acute infarct. The axial FLAIR sequence is markedly motion degraded. There is parenchymal volume loss with prominence of the ventricular system and extra-axial CSF spaces. There is a remote left PCA infarct, better seen on same-day CT. There is no midline shift. Vascular: Not well assessed. Skull and upper cervical spine: Not well assessed. Sinuses/Orbits: The imaged paranasal sinuses are clear. The globes and orbits are not well assessed. Other: None. IMPRESSION: 1. Truncated study with markedly motion degraded DWI and FLAIR sequences obtained. 2. Possible small acute infarct versus artifact in the left thalamus. Electronically Signed   By: Lesia Hausen M.D.   On: 01/05/2023 14:07   CT ANGIO HEAD NECK W WO CM (CODE STROKE)  Result Date: 01/05/2023 CLINICAL DATA:  Right-sided weakness and aphasia EXAM: CT ANGIOGRAPHY HEAD AND NECK WITH AND WITHOUT CONTRAST TECHNIQUE: Multidetector CT imaging  of the head and neck was performed using the standard protocol during bolus administration of intravenous contrast. Multiplanar CT image reconstructions and MIPs were obtained to evaluate the vascular anatomy. Carotid stenosis measurements (when applicable) are obtained utilizing NASCET criteria, using the distal internal carotid diameter as the denominator. RADIATION DOSE REDUCTION: This exam was performed according to the departmental dose-optimization program which includes automated exposure control, adjustment of the mA and/or kV according to patient size and/or use of iterative reconstruction technique. CONTRAST:  75mL OMNIPAQUE IOHEXOL 350 MG/ML SOLN COMPARISON:  MRA/MR head 04/03/2022 FINDINGS: CTA NECK FINDINGS Aortic arch: There is calcified plaque in the imaged aortic arch. The origins of the major branch vessels are patent. The subclavian arteries are patent to the level imaged. Right carotid system: The right common carotid artery is patent. There is bulky plaque in the proximal internal carotid artery resulting in up to approximately 70-80% stenosis. The distal internal carotid artery is patent. The external carotid artery is patent. There is no evidence of dissection or aneurysm. Left carotid system: The left common carotid artery is patent with mixed plaque resulting in up to approximately 60% stenosis. There is mild plaque at the bifurcation without significant stenosis of the internal carotid artery. The external carotid artery is patent. There is no evidence of dissection or aneurysm. Vertebral arteries: The vertebral arteries are patent, without hemodynamically significant stenosis or occlusion there is no evidence of dissection or aneurysm. Skeleton: There is no acute osseous abnormality or suspicious osseous lesion. There is no visible canal hematoma. There is advanced multilevel degenerative change in the cervical spine without evidence of high-grade spinal canal stenosis. Other neck: The soft  tissues of the neck are unremarkable. Upper chest: There is biapical pleural scarring. Review of the MIP images confirms the above findings CTA HEAD FINDINGS Anterior circulation: There is calcified plaque in the intracranial ICAs without significant stenosis or occlusion. The bilateral MCAs are patent, without proximal stenosis or occlusion. The bilateral ACAS are patent, without proximal stenosis or occlusion. There is a 3 mm left MCA bifurcation aneurysm, unchanged. Posterior circulation: The bilateral V4 segments are patent. The basilar artery is patent. The major cerebellar arteries appear patent. The left  PCA is diminutive beyond the P2 segment. Otherwise, the PCAs are patent, without other proximal high-grade stenosis or occlusion. Posterior communicating arteries are not identified. There is no aneurysm or AVM. Venous sinuses: Patent. Anatomic variants: None. Review of the MIP images confirms the above findings IMPRESSION: 1. No emergent large vessel occlusion. 2. Calcified plaque in the proximal right internal carotid artery resulting in approximately 70-80% stenosis. 3. Mixed plaque in the left common carotid artery resulting in approximately 60% stenosis. 4. Diminutive left PCA beyond the P2 segment. Otherwise, patent intracranial vasculature without other proximal high-grade stenosis or occlusion. 5. 3 mm left MCA bifurcation aneurysm, unchanged. Electronically Signed   By: Lesia Hausen M.D.   On: 01/05/2023 13:43   CT HEAD CODE STROKE WO CONTRAST  Result Date: 01/05/2023 CLINICAL DATA:  Code stroke.  Neuro deficit, acute, stroke suspected EXAM: CT HEAD WITHOUT CONTRAST TECHNIQUE: Contiguous axial images were obtained from the base of the skull through the vertex without intravenous contrast. RADIATION DOSE REDUCTION: This exam was performed according to the departmental dose-optimization program which includes automated exposure control, adjustment of the mA and/or kV according to patient size and/or  use of iterative reconstruction technique. COMPARISON:  CT head October 29, 23. FINDINGS: Motion limited. Brain: Remote left PCA territory infarct. Remote left posterior limb of the internal capsule infarct. No evidence of acute large vascular territory infarct, acute hemorrhage, mass lesion or midline shift. Cerebral atrophy with ex vacuo ventricular dilation. Vascular: No hyperdense vessel. Skull: No acute fracture. Sinuses/Orbits: Clear sinuses.  No acute orbital findings. Other: No mastoid effusions. ASPECTS [facility] Stroke Program Early CT Score) Total score (0-10 with 10 being normal): 10. IMPRESSION: 1. No evidence of acute large vascular territory infarct or acute hemorrhage. ASPECTS is 10. 2. Remote left PCA infarcts and chronic microvascular ischemic change. Code stroke imaging results were communicated on 01/05/2023 at 1:22 pm to provider Charm Barges via telephone, who verbally acknowledged these results. Electronically Signed   By: Feliberto Harts M.D.   On: 01/05/2023 13:24    Procedures .Critical Care  Performed by: Terrilee Files, MD Authorized by: Terrilee Files, MD   Critical care provider statement:    Critical care time (minutes):  45   Critical care time was exclusive of:  Separately billable procedures and treating other patients   Critical care was necessary to treat or prevent imminent or life-threatening deterioration of the following conditions:  CNS failure or compromise   Critical care was time spent personally by me on the following activities:  Development of treatment plan with patient or surrogate, discussions with consultants, evaluation of patient's response to treatment, examination of patient, obtaining history from patient or surrogate, ordering and performing treatments and interventions, ordering and review of laboratory studies, ordering and review of radiographic studies, pulse oximetry, re-evaluation of patient's condition and review of old charts   I assumed  direction of critical care for this patient from another provider in my specialty: no       Medications Ordered in ED Medications  tenecteplase (TNKASE) 50 MG injection for Stroke (  Not Given 01/05/23 1358)  levETIRAcetam (KEPPRA) 750 mg in sodium chloride 0.9 % 100 mL IVPB (has no administration in time range)  LORazepam (ATIVAN) injection 2 mg (2 mg Intravenous Given 01/05/23 1308)  iohexol (OMNIPAQUE) 350 MG/ML injection 75 mL (75 mLs Intravenous Contrast Given 01/05/23 1308)  levETIRAcetam (KEPPRA) IVPB 1500 mg/ 100 mL premix (0 mg Intravenous Stopped 01/05/23 1644)    Followed by  levETIRAcetam (KEPPRA) IVPB 1500 mg/ 100 mL premix (0 mg Intravenous Stopped 01/05/23 1644)    Followed by  levETIRAcetam (KEPPRA) IVPB 1000 mg/100 mL premix (0 mg Intravenous Stopped 01/05/23 1644)    ED Course/ Medical Decision Making/ A&P Clinical Course as of 01/05/23 1738  Fri Jan 05, 2023  1308 Will review his prior notes it looks like he was on Plavix but best information we have now is that he is only on aspirin. [MB]  [id number] Radiology reviewed CT and they do not see any acute signs of stroke.  Has old stroke and significant white matter changes. [MB]  1326 Discussed with Dr. Selina Cooley neurology.  She would like the patient to get an emergent MRI as his presentation could be stroke but also could just be seizure. [MB]  1348 Dr. Selina Cooley is recommending patient be transferred to [facility] to get EEG and further neurology evaluation for decision on level of admission. [MB]    Clinical Course User Index [MB] Terrilee Files, MD                                 Medical Decision Making Amount and/or Complexity of Data Reviewed Labs: ordered. Radiology: ordered.  Risk Prescription drug management.   This patient complains of acute altered mental status and aphasia seizure; this involves an extensive number of treatment Options and is a complaint that carries with it a high risk of complications and morbidity. The  differential includes seizure, stroke, bleed, hypertensive emergency, hypoglycemia  I ordered, reviewed and interpreted labs, which included CBC with elevated white count hemoglobin lower than baseline, chemistries with chronic CKD low bicarb I ordered medication IV Ativan and reviewed PMP when indicated. I ordered imaging studies which included head CT right brain and I independently    visualized and interpreted imaging which showed no acute stroke Additional history obtained from EMS Previous records obtained and reviewed in epic, recent ED visits for epistaxis I consulted neurology Dr. Selina Cooley and discussed lab and imaging findings and discussed disposition.  Cardiac monitoring reviewed, sinus rhythm Social determinants considered, no significant barriers Critical Interventions: Acute stroke activation requiring bedside presence and complex decision making in conjunction with neurologic consultant.  After the interventions stated above, I reevaluated the patient and found him to not return to normal mental status.  Still has right gaze deviation and not following commands. Admission and further testing considered, patient be transferred to [facility] for stat EEG and neurology evaluation.  Anticipate will need mission down there for further workup.         Final Clinical Impression(s) / ED Diagnoses Final diagnoses:  Seizure (HCC)  Altered mental status, unspecified altered mental status type    Rx / DC Orders ED Discharge Orders     None         Terrilee Files, MD 01/05/23 1742

## 2023-01-05 NOTE — ED Notes (Signed)
ED TO INPATIENT HANDOFF REPORT  ED Nurse Name and Phone #: [phone]  S Name/Age/Gender Adam Arellano 89 y.o. male Room/Bed: 033C/033C  Code Status   Code Status: DNR  Home/SNF/Other Home Patient oriented to: PT non verbal Is this baseline? No   Triage Complete: Triage complete  Chief Complaint Seizure [facility]) [R56.9]  Triage Note Pt BIB [facility] for c/o code stroke with LKW at 10 am; son reported to ems that pt was alert and talking and then suddenly at 10am pt stopped talking and had continuous gaze to the right  Ems arrived and once pt in the truck pt witnessed to have a full body seizure.    When pt arrived to ED pt unable to speak and has purposeful movement of all extremities but unable to speak and has head turned to right  Pt unable to follow any commands  BP with ems 152/75, HR 117, cbg 130  Pt has bruise to left shoulder   Allergies Allergies  Allergen Reactions   Simvastatin Other (See Comments)    MUSCLE ACHES REAL BAD   Atorvastatin     Other reaction(s): stiffness, decreased energy   Lisinopril Cough   Rosuvastatin     Other reaction(s): weakness    Level of Care/Admitting Diagnosis ED Disposition     ED Disposition  Admit   Condition  --   Comment  Hospital Area: [facility] [[id number]]  Level of Care: Progressive [102]  Admit to Progressive based on following criteria: NEUROLOGICAL AND NEUROSURGICAL complex patients with significant risk of instability, who do not meet ICU criteria, yet require close observation or frequent assessment (< / = every 2 - 4 hours) with medical / nursing intervention.  May place patient in observation at [facility] or [facility] if equivalent level of care is available:: No  Covid Evaluation: Asymptomatic - no recent exposure (last 10 days) testing not required  Diagnosis: Seizure [facility]) [[id number]]  Admitting Physician: Synetta Fail [[id number]]  Attending Physician: Synetta Fail  [[id number]]  Bed request comments: 4N preferred          B Medical/Surgery History Past Medical History:  Diagnosis Date   Acute ischemic left posterior cerebral artery (PCA) stroke (HCC) 04/07/2022   Asthma    a. Childhood.   Closed right hip fracture (HCC) 07/01/2022   Coronary artery disease    a. NSTEMI s/p DES to dRCA 2007. b. Angina 07/2009: s/p DESx2 to mid RCA for ISR and prox RCA, EF 65%. c. Stress test 05/2013: low risk, no evidence of ischemia, EF 61%.   CVA (cerebral vascular accident) (HCC)    Dyslipidemia    Fall at home, initial encounter 04/02/2022   GERD (gastroesophageal reflux disease)    History of kidney stones    Hypertension    Peripheral vascular disease (HCC)    Prostate cancer (HCC)    a. s/p radical prostatectomy.   PVC's (premature ventricular contractions)    S/P mitral valve clip implantation 08/19/2020   s/p MitraClip implantation with a XTW by Dr. Excell Seltzer    Transient global amnesia    a. Adm 2009: imaging nonacute, had resolved.   Past Surgical History:  Procedure Laterality Date   CARDIAC CATHETERIZATION     COLONOSCOPY     CORONARY STENT INTERVENTION N/A 08/06/2020   Procedure: CORONARY STENT INTERVENTION;  Surgeon: Tonny Bollman, MD;  Location: [facility] INVASIVE CV LAB;  Service: Cardiovascular;  Laterality: N/A;   EYE SURGERY Bilateral  cataracts removed   HIP ARTHROPLASTY Right 07/03/2022   Procedure: ARTHROPLASTY BIPOLAR HIP (HEMIARTHROPLASTY);  Surgeon: Joen Laura, MD;  Location: [facility] ORS;  Service: Orthopedics;  Laterality: Right;   MASTOIDECTOMY     L ear   MITRAL VALVE REPAIR     MITRAL VALVE REPAIR N/A 08/19/2020   Procedure: MITRAL VALVE REPAIR;  Surgeon: Tonny Bollman, MD;  Location: [facility] INVASIVE CV LAB;  Service: Cardiovascular;  Laterality: N/A;   PROSTATECTOMY     RIGHT/LEFT HEART CATH AND CORONARY ANGIOGRAPHY N/A 08/06/2020   Procedure: RIGHT/LEFT HEART CATH AND CORONARY ANGIOGRAPHY;  Surgeon: Tonny Bollman, MD;   Location: [facility] INVASIVE CV LAB;  Service: Cardiovascular;  Laterality: N/A;   TEE WITHOUT CARDIOVERSION N/A 08/06/2020   Procedure: TRANSESOPHAGEAL ECHOCARDIOGRAM (TEE);  Surgeon: Thurmon Fair, MD;  Location: [facility] ENDOSCOPY;  Service: Cardiovascular;  Laterality: N/A;   TEE WITHOUT CARDIOVERSION  08/19/2020   TEE WITHOUT CARDIOVERSION N/A 08/19/2020   Procedure: TRANSESOPHAGEAL ECHOCARDIOGRAM (TEE);  Surgeon: Tonny Bollman, MD;  Location: [facility] INVASIVE CV LAB;  Service: Cardiovascular;  Laterality: N/A;     A IV Location/Drains/Wounds Patient Lines/Drains/Airways Status     Active Line/Drains/Airways     Name Placement date Placement time Site Days   Peripheral IV 01/05/23 16 G Right Antecubital 01/05/23  1256  Antecubital  less than 1   Peripheral IV 01/05/23 16 G Left Antecubital 01/05/23  1256  Antecubital  less than 1   Peripheral IV 01/05/23 20 G Anterior;Left Forearm 01/05/23  1257  Forearm  less than 1   External Urinary Catheter 01/05/23  1358  --  less than 1   Pressure Injury 07/07/22 Buttocks Right;Left;Mid Stage 2 -  Partial thickness loss of dermis presenting as a shallow open injury with a red, pink wound bed without slough. Two wounds where buttocks skin meet. Approx 2x2cm of unblanchable lesions. Red bla 07/07/22  1400  -- 182   Wound / Incision (Open or Dehisced) Arm Anterior;Proximal;Right;Upper Wound #1 --  --  Arm  --   Wound / Incision (Open or Dehisced) 04/07/22 Skin tear Hand Posterior;Right skin tear with partial flap 04/07/22  1657  Hand  273   Wound / Incision (Open or Dehisced) 04/07/22 Skin tear Arm Anterior;Right;Upper no flap skin tear #2 04/07/22  1807  Arm  273   Wound / Incision (Open or Dehisced) 04/07/22 Skin tear Arm Anterior;Right;Upper no flap skin tear #3 04/07/22  1809  Arm  273            Intake/Output Last 24 hours No intake or output data in the 24 hours ending 01/05/23 1816  Labs/Imaging Results for orders placed or performed during the  hospital encounter of 01/05/23 (from the past 48 hour(s))  Ethanol     Status: None   Collection Time: 01/05/23 12:47 PM  Result Value Ref Range   Alcohol, Ethyl (B) <10 <10 mg/dL    Comment: (NOTE) Lowest detectable limit for serum alcohol is 10 mg/dL.  For medical purposes only. Performed at [facility], [address], [place], [place] [address]   Protime-INR     Status: None   Collection Time: 01/05/23 12:47 PM  Result Value Ref Range   Prothrombin Time 13.9 11.4 - 15.2 seconds   INR 1.1 0.8 - 1.2    Comment: (NOTE) INR goal varies based on device and disease states. Performed at [facility], [address] [place], [place] [address]   APTT     Status: None  Collection Time: 01/05/23 12:47 PM  Result Value Ref Range   aPTT 27 24 - 36 seconds    Comment: Performed at [facility], [address], [place], [place] [address]  CBC     Status: Abnormal   Collection Time: 01/05/23 12:47 PM  Result Value Ref Range   WBC 11.8 (H) 4.0 - 10.5 K/uL   RBC 3.69 (L) 4.22 - 5.81 MIL/uL   Hemoglobin 10.8 (L) 13.0 - 17.0 g/dL   HCT 60.4 (L) 54.0 - 98.1 %   MCV 96.5 80.0 - 100.0 fL   MCH 29.3 26.0 - 34.0 pg   MCHC 30.3 30.0 - 36.0 g/dL   RDW 19.1 47.8 - 29.5 %   Platelets 312 150 - 400 K/uL   nRBC 0.0 0.0 - 0.2 %    Comment: Performed at [facility], [address], [place], [place] [address]  Differential     Status: Abnormal   Collection Time: 01/05/23 12:47 PM  Result Value Ref Range   Neutrophils Relative % 52 %   Neutro Abs 6.2 1.7 - 7.7 K/uL   Lymphocytes Relative 38 %   Lymphs Abs 4.5 (H) 0.7 - 4.0 K/uL   Monocytes Relative 7 %   Monocytes Absolute 0.9 0.1 - 1.0 K/uL   Eosinophils Relative 2 %   Eosinophils Absolute 0.2 0.0 - 0.5 K/uL   Basophils Relative 1 %   Basophils Absolute 0.1 0.0 - 0.1 K/uL   Immature Granulocytes 0 %   Abs Immature Granulocytes 0.04 0.00 - 0.07 K/uL    Comment: Performed at [facility], [address], [place], [place] [address]   Comprehensive metabolic panel     Status: Abnormal   Collection Time: 01/05/23 12:47 PM  Result Value Ref Range   Sodium 136 135 - 145 mmol/L   Potassium 3.7 3.5 - 5.1 mmol/L   Chloride 107 98 - 111 mmol/L   CO2 17 (L) 22 - 32 mmol/L   Glucose, Bld 129 (H) 70 - 99 mg/dL    Comment: Glucose reference range applies only to samples taken after fasting for at least 8 hours.   BUN 29 (H) 8 - 23 mg/dL   Creatinine, Ser 4.69 (H) 0.61 - 1.24 mg/dL   Calcium 8.4 (L) 8.9 - 10.3 mg/dL   Total Protein 6.3 (L) 6.5 - 8.1 g/dL   Albumin 3.3 (L) 3.5 - 5.0 g/dL   AST 17 15 - 41 U/L   ALT 11 0 - 44 U/L   Alkaline Phosphatase 81 38 - 126 U/L   Total Bilirubin 0.2 (L) 0.3 - 1.2 mg/dL   GFR, Estimated 37 (L) >60 mL/min    Comment: (NOTE) Calculated using the CKD-EPI Creatinine Equation (2021)    Anion gap 12 5 - 15    Comment: Performed at [facility], [address] [place], [place] [address]  CBG monitoring, ED     Status: Abnormal   Collection Time: 01/05/23 12:47 PM  Result Value Ref Range   Glucose-Capillary 114 (H) 70 - 99 mg/dL    Comment: Glucose reference range applies only to samples taken after fasting for at least 8 hours.  Urine rapid drug screen (hosp performed)     Status: Abnormal   Collection Time: 01/05/23  3:24 PM  Result Value Ref Range   Opiates NONE DETECTED NONE DETECTED   Cocaine NONE DETECTED NONE DETECTED   Benzodiazepines POSITIVE (A) NONE DETECTED   Amphetamines NONE DETECTED NONE DETECTED   Tetrahydrocannabinol NONE DETECTED  NONE DETECTED   Barbiturates NONE DETECTED NONE DETECTED    Comment: (NOTE) DRUG SCREEN FOR MEDICAL PURPOSES ONLY.  IF CONFIRMATION IS NEEDED FOR ANY PURPOSE, NOTIFY LAB WITHIN 5 DAYS.  LOWEST DETECTABLE LIMITS FOR URINE DRUG SCREEN Drug Class                     Cutoff (ng/mL) Amphetamine and metabolites    1000 Barbiturate and metabolites    200 Benzodiazepine                 200 Opiates and metabolites        300 Cocaine and  metabolites        300 THC                            50 Performed at [facility], [address] [place], [place] [address]   Urinalysis, Routine w reflex microscopic -Urine, Clean Catch     Status: Abnormal   Collection Time: 01/05/23  3:24 PM  Result Value Ref Range   Color, Urine STRAW (A) YELLOW   APPearance CLEAR CLEAR   Specific Gravity, Urine 1.018 1.005 - 1.030   pH 6.0 5.0 - 8.0   Glucose, UA NEGATIVE NEGATIVE mg/dL   Hgb urine dipstick SMALL (A) NEGATIVE   Bilirubin Urine NEGATIVE NEGATIVE   Ketones, ur NEGATIVE NEGATIVE mg/dL   Protein, ur NEGATIVE NEGATIVE mg/dL   Nitrite NEGATIVE NEGATIVE   Leukocytes,Ua NEGATIVE NEGATIVE   RBC / HPF 0-5 0 - 5 RBC/hpf   WBC, UA 0-5 0 - 5 WBC/hpf   Bacteria, UA NONE SEEN NONE SEEN   Squamous Epithelial / HPF 0-5 0 - 5 /HPF    Comment: Performed at [facility], [address], [place], [place] [address]   MR BRAIN WO CONTRAST  Result Date: 01/05/2023 CLINICAL DATA:  Code stroke EXAM: MRI HEAD WITHOUT CONTRAST TECHNIQUE: Multiplanar, multiecho pulse sequences of the brain and surrounding structures were obtained without intravenous contrast. COMPARISON:  Same-day CT head, brain MRI 04/03/2022 FINDINGS: A truncated protocol was performed. Motion degraded DWI and FLAIR sequences were obtained. Brain: There is no large vessel territorial infarct. There is mildly elevated DWI signal in the left thalamus along the left lateral ventricle which may be artifactual or a small acute infarct. The axial FLAIR sequence is markedly motion degraded. There is parenchymal volume loss with prominence of the ventricular system and extra-axial CSF spaces. There is a remote left PCA infarct, better seen on same-day CT. There is no midline shift. Vascular: Not well assessed. Skull and upper cervical spine: Not well assessed. Sinuses/Orbits: The imaged paranasal sinuses are clear. The globes and orbits are not well assessed. Other: None. IMPRESSION: 1.  Truncated study with markedly motion degraded DWI and FLAIR sequences obtained. 2. Possible small acute infarct versus artifact in the left thalamus. Electronically Signed   By: Lesia Hausen M.D.   On: 01/05/2023 14:07   CT ANGIO HEAD NECK W WO CM (CODE STROKE)  Result Date: 01/05/2023 CLINICAL DATA:  Right-sided weakness and aphasia EXAM: CT ANGIOGRAPHY HEAD AND NECK WITH AND WITHOUT CONTRAST TECHNIQUE: Multidetector CT imaging of the head and neck was performed using the standard protocol during bolus administration of intravenous contrast. Multiplanar CT image reconstructions and MIPs were obtained to evaluate the vascular anatomy. Carotid stenosis measurements (when applicable) are obtained utilizing NASCET criteria, using the distal internal carotid diameter as  the denominator. RADIATION DOSE REDUCTION: This exam was performed according to the departmental dose-optimization program which includes automated exposure control, adjustment of the mA and/or kV according to patient size and/or use of iterative reconstruction technique. CONTRAST:  75mL OMNIPAQUE IOHEXOL 350 MG/ML SOLN COMPARISON:  MRA/MR head 04/03/2022 FINDINGS: CTA NECK FINDINGS Aortic arch: There is calcified plaque in the imaged aortic arch. The origins of the major branch vessels are patent. The subclavian arteries are patent to the level imaged. Right carotid system: The right common carotid artery is patent. There is bulky plaque in the proximal internal carotid artery resulting in up to approximately 70-80% stenosis. The distal internal carotid artery is patent. The external carotid artery is patent. There is no evidence of dissection or aneurysm. Left carotid system: The left common carotid artery is patent with mixed plaque resulting in up to approximately 60% stenosis. There is mild plaque at the bifurcation without significant stenosis of the internal carotid artery. The external carotid artery is patent. There is no evidence of  dissection or aneurysm. Vertebral arteries: The vertebral arteries are patent, without hemodynamically significant stenosis or occlusion there is no evidence of dissection or aneurysm. Skeleton: There is no acute osseous abnormality or suspicious osseous lesion. There is no visible canal hematoma. There is advanced multilevel degenerative change in the cervical spine without evidence of high-grade spinal canal stenosis. Other neck: The soft tissues of the neck are unremarkable. Upper chest: There is biapical pleural scarring. Review of the MIP images confirms the above findings CTA HEAD FINDINGS Anterior circulation: There is calcified plaque in the intracranial ICAs without significant stenosis or occlusion. The bilateral MCAs are patent, without proximal stenosis or occlusion. The bilateral ACAS are patent, without proximal stenosis or occlusion. There is a 3 mm left MCA bifurcation aneurysm, unchanged. Posterior circulation: The bilateral V4 segments are patent. The basilar artery is patent. The major cerebellar arteries appear patent. The left PCA is diminutive beyond the P2 segment. Otherwise, the PCAs are patent, without other proximal high-grade stenosis or occlusion. Posterior communicating arteries are not identified. There is no aneurysm or AVM. Venous sinuses: Patent. Anatomic variants: None. Review of the MIP images confirms the above findings IMPRESSION: 1. No emergent large vessel occlusion. 2. Calcified plaque in the proximal right internal carotid artery resulting in approximately 70-80% stenosis. 3. Mixed plaque in the left common carotid artery resulting in approximately 60% stenosis. 4. Diminutive left PCA beyond the P2 segment. Otherwise, patent intracranial vasculature without other proximal high-grade stenosis or occlusion. 5. 3 mm left MCA bifurcation aneurysm, unchanged. Electronically Signed   By: Lesia Hausen M.D.   On: 01/05/2023 13:43   CT HEAD CODE STROKE WO CONTRAST  Result Date:  01/05/2023 CLINICAL DATA:  Code stroke.  Neuro deficit, acute, stroke suspected EXAM: CT HEAD WITHOUT CONTRAST TECHNIQUE: Contiguous axial images were obtained from the base of the skull through the vertex without intravenous contrast. RADIATION DOSE REDUCTION: This exam was performed according to the departmental dose-optimization program which includes automated exposure control, adjustment of the mA and/or kV according to patient size and/or use of iterative reconstruction technique. COMPARISON:  CT head October 29, 23. FINDINGS: Motion limited. Brain: Remote left PCA territory infarct. Remote left posterior limb of the internal capsule infarct. No evidence of acute large vascular territory infarct, acute hemorrhage, mass lesion or midline shift. Cerebral atrophy with ex vacuo ventricular dilation. Vascular: No hyperdense vessel. Skull: No acute fracture. Sinuses/Orbits: Clear sinuses.  No acute orbital findings. Other: No mastoid  effusions. ASPECTS [facility] Stroke Program Early CT Score) Total score (0-10 with 10 being normal): 10. IMPRESSION: 1. No evidence of acute large vascular territory infarct or acute hemorrhage. ASPECTS is 10. 2. Remote left PCA infarcts and chronic microvascular ischemic change. Code stroke imaging results were communicated on 01/05/2023 at 1:22 pm to provider Charm Barges via telephone, who verbally acknowledged these results. Electronically Signed   By: Feliberto Harts M.D.   On: 01/05/2023 13:24    Pending Labs Unresulted Labs (From admission, onward)     Start     Ordered   01/12/23 0500  Creatinine, serum  (enoxaparin (LOVENOX)    CrCl >/= 30 ml/min)  Weekly,   R     Comments: while on enoxaparin therapy    01/05/23 1753   01/06/23 0500  Comprehensive metabolic panel  Tomorrow morning,   R        01/05/23 1753   01/06/23 0500  CBC  Tomorrow morning,   R        01/05/23 1753            Vitals/Pain Today's Vitals   01/05/23 1400 01/05/23 1415 01/05/23 1630 01/05/23  1752  BP: (!) 145/78 (!) 154/74 (!) 161/73   Pulse: 86 85 85   Resp: 18 18 19    Temp:    98.1 F (36.7 C)  TempSrc:    Axillary  SpO2: 94% 95% 100%   PainSc:        Isolation Precautions No active isolations  Medications Medications  tenecteplase (TNKASE) 50 MG injection for Stroke (  Not Given 01/05/23 1358)  levETIRAcetam (KEPPRA) 750 mg in sodium chloride 0.9 % 100 mL IVPB (has no administration in time range)  enoxaparin (LOVENOX) injection 40 mg (40 mg Subcutaneous Not Given 01/05/23 1813)  LORazepam (ATIVAN) injection 2 mg (has no administration in time range)  sodium chloride flush (NS) 0.9 % injection 3 mL (has no administration in time range)  acetaminophen (TYLENOL) tablet 650 mg (has no administration in time range)    Or  acetaminophen (TYLENOL) suppository 650 mg (has no administration in time range)  polyethylene glycol (MIRALAX / GLYCOLAX) packet 17 g (has no administration in time range)  LORazepam (ATIVAN) injection 2 mg (2 mg Intravenous Given 01/05/23 1308)  iohexol (OMNIPAQUE) 350 MG/ML injection 75 mL (75 mLs Intravenous Contrast Given 01/05/23 1308)  levETIRAcetam (KEPPRA) IVPB 1500 mg/ 100 mL premix (0 mg Intravenous Stopped 01/05/23 1644)    Followed by  levETIRAcetam (KEPPRA) IVPB 1500 mg/ 100 mL premix (0 mg Intravenous Stopped 01/05/23 1644)    Followed by  levETIRAcetam (KEPPRA) IVPB 1000 mg/100 mL premix (0 mg Intravenous Stopped 01/05/23 1644)    Mobility non-ambulatory     Focused Assessments Cardiac Assessment Handoff:    Lab Results  Component Value Date   CKTOTAL 413 (H) 04/02/2022   CKMB 1.6 08/26/2007   TROPONINI <0.30 05/16/2013   No results found for: "DDIMER" Does the Patient currently have chest pain?  PT does not respond to questions   , Neuro Assessment Handoff:  Swallow screen pass?  Pt not alert enough to attempt swallow screen   NIH Stroke Scale  Dizziness Present: No Headache Present: No Interval: Initial Level of  Consciousness (1a.)   : Not alert, requires repeated stimulation to attend, or is obtunded and requires strong or painful stimulation to make movements (not stereotyped) LOC Questions (1b. )   : Answers neither question correctly LOC Commands (1c. )   : Performs  neither task correctly Best Gaze (2. )  : Forced deviation Visual (3. )  : Complete hemianopia Facial Palsy (4. )    : Normal symmetrical movements Motor Arm, Left (5a. )   : Some effort against gravity Motor Arm, Right (5b. ) : Some effort against gravity Motor Leg, Left (6a. )  : Some effort against gravity Motor Leg, Right (6b. ) : No effort against gravity Limb Ataxia (7. ): Absent Sensory (8. )  : Normal, no sensory loss Best Language (9. )  : Mute, global aphasia Dysarthria (10. ): Severe dysarthria, patient's speech is so slurred as to be unintelligible in the absence of or out of proportion to any dysphasia, or is mute/anarthric Extinction/Inattention (11.)   : Profound hemi-inattention or extinction to more than one modality Complete NIHSS TOTAL: 26 Last date known well: 01/05/23 Last time known well: 1000 Neuro Assessment:   Neuro Checks:   Initial (01/05/23 1330)  Has TPA been given? No If patient is a Neuro Trauma and patient is going to OR before floor call report to 4N Charge nurse: [phone] or [phone]   R Recommendations: See Admitting Provider Note  Report given to:   Additional Notes: Daughter at bedside to answer any questions.

## 2023-01-05 NOTE — ED Notes (Addendum)
Pt arrived with pupils perrla but slightly sluggish to both sides. Responding to pain to all sides and moving all extremities. Facing toward right side. Right gaze bilateral. Onway with pt to mri at 1323   vo to hold TNK by teleneuro doc at this time.

## 2023-01-05 NOTE — ED Notes (Signed)
Pt arrived to ed room 14 at 1315

## 2023-01-05 NOTE — Progress Notes (Signed)
Telestroke cart was activated at 1243. Per treatment team, pt's LKW was at 1000 with c/o R sided gaze, aphasia, tonic/clonic seizure, not following commands. Dr. Charm Barges, EDP at bedside assessing patient prior to cart activation. Pt transported to CT at 1248 and returned to room at 1318. [facility] was paged for code stroke at 1243. Dr. Selina Cooley, [facility] appeared on telestroke cart at 1249 to assess the patient. Pt was transported MRI at 1326 for advanced imaging to determine TNK admin eligibility per Dr. Selina Cooley. Based on [facility]'s assessment, pt does not meet criteria for emergent interventions at this time 2/2 "There was no large area of restricted diffusion, a small amount of increased diffusion along lateral ventricle adjacent to L thalamus was favored to be artifactual and would not explain his sx therefore TNK was not administered. " [facility] to f/u with EDP regarding recommendations. No further needs from Telestroke RN at this time. Telestroke cart disconnected at 1339.

## 2023-01-05 NOTE — ED Notes (Addendum)
Returned with pt from MRI to ED room 14. Per neuro tele doc there is no stroke seen on MRI, not to give tnK. . Read back and verfied. Pt sleepy/groggy at this time but still responds to pain. Daughter at bedside and updated. Seizure pads placed.

## 2023-01-05 NOTE — ED Notes (Signed)
Pt has not arrived to room, went straight to CT

## 2023-01-05 NOTE — ED Notes (Signed)
Report given to [facility] 

## 2023-01-05 NOTE — ED Provider Notes (Signed)
Patient is a 89 year old male who presented to [facility] emergency department as a code stroke.  He was found to have right gaze deviation and right-sided weakness.  He also had generalized tonic-clonic type activity and was given Versed by EMS and then additional 2 mg Ativan in the ED.  Imaging studies do not reveal any acute infarct.  He was transferred here for emergent EEG.  Keppra was ordered by Dr. Selina Cooley and is currently being given.  Patient is awake and a bit agitated.  He is constantly moving around in the bed.  I do not appreciate any gaze deviation currently.  He seems to be moving all extremities although I cannot do a great assessment as he is not following commands.  Neurology notified of pt's arrival.  Dr. Amada Jupiter has seen the patient.  Recommends hospitalist admission.  I spoke with Dr. Alinda Money who will admit the patient for further treatment.   Rolan Bucco, MD 01/05/23 1739

## 2023-01-05 NOTE — Progress Notes (Signed)
Arrived to unit at 1854 but unable to obtain vitals at that point due to agitation.  Not following commands. Unable to assess orientation. Daughter at bedside but will be leaving. Not able to complete admission assessment at this time.     01/05/23 1925  Assess: MEWS Score  Temp 98.4 F (36.9 C)  BP (!) 163/71  MAP (mmHg) 100  Pulse Rate 83  ECG Heart Rate 83  Resp 18  Level of Consciousness Responds to Voice  SpO2 92 %  O2 Device Room Air  Assess: MEWS Score  MEWS Temp 0  MEWS Systolic 0  MEWS Pulse 0  MEWS RR 0  MEWS LOC 1  MEWS Score 1  MEWS Score Color Green  Assess: if the MEWS score is Yellow or Red  Were vital signs accurate and taken at a resting state? Yes  Assess: SIRS CRITERIA  SIRS Temperature  0  SIRS Pulse 0  SIRS Respirations  0  SIRS WBC 0  SIRS Score Sum  0

## 2023-01-05 NOTE — Consult Note (Signed)
NEUROLOGY TELECONSULTATION NOTE   Date of service: January 05, 2023 Patient Name: Adam Arellano MRN:  [id number] DOB:  May 07, 1932 Reason for consult: telestroke  Requesting Provider: Dr. Meridee Score Consult Participants: myself, patient, bedside RN, telestroke RN Location of the provider: [facility] Location of the patient: [facility]  This consult was provided via telemedicine with 2-way video and audio communication. The patient/family was informed that care would be provided in this way and agreed to receive care in this manner.   _ _ _   _ __   _ __ _ _  __ __   _ __   __ _  History of Present Illness   This is a 89 yo man with hx CAD, prior CVA, HTN, prostate cancer, s/p MV clip implantation who presents after episode of R gaze deviation, R sided weakness, and seizure. LKW 1200 per son-in-law, after which time family left the room and returned to find him confused with difficulty moving his R side. EMS was called and en route to hospital he had a GTC and received 5mg  versed. He had persistent R gaze preference and stupor in ED and received 2mg  IV ativan without resolution of the gaze deviation. Head CT and CTA H&N showed no acute findings on personal review. Given the patient's prior hx L PCA infarct, his R sided symptoms, and R gaze deviation seizure was favored to be more likely than acute infarct therefore MRI brain was performed prior to TNK decision. There was no large area of restricted diffusion, a small amount of increased diffusion along lateral ventricle adjacent to L thalamus was favored to be artifactual and would not explain his sx therefore TNK was not administered. Patient had persistent R gaze deviation and was transferred to [facility] ED for STAT EEG and further mgmt.   ROS   UTA 2/2 mental status  Past History   The following was personally reviewed:  Past Medical History:  Diagnosis Date   Asthma    a. Childhood.   Coronary artery disease    a. NSTEMI s/p DES to dRCA 2007. b.  Angina 07/2009: s/p DESx2 to mid RCA for ISR and prox RCA, EF 65%. c. Stress test 05/2013: low risk, no evidence of ischemia, EF 61%.   CVA (cerebral vascular accident) (HCC)    Dyslipidemia    GERD (gastroesophageal reflux disease)    History of kidney stones    Hypertension    Peripheral vascular disease (HCC)    Prostate cancer (HCC)    a. s/p radical prostatectomy.   PVC's (premature ventricular contractions)    S/P mitral valve clip implantation 08/19/2020   s/p MitraClip implantation with a XTW by Dr. Excell Seltzer    Transient global amnesia    a. Adm 2009: imaging nonacute, had resolved.   Past Surgical History:  Procedure Laterality Date   CARDIAC CATHETERIZATION     COLONOSCOPY     CORONARY STENT INTERVENTION N/A 08/06/2020   Procedure: CORONARY STENT INTERVENTION;  Surgeon: Tonny Bollman, MD;  Location: [facility] INVASIVE CV LAB;  Service: Cardiovascular;  Laterality: N/A;   EYE SURGERY Bilateral    cataracts removed   HIP ARTHROPLASTY Right 07/03/2022   Procedure: ARTHROPLASTY BIPOLAR HIP (HEMIARTHROPLASTY);  Surgeon: Joen Laura, MD;  Location: [facility] ORS;  Service: Orthopedics;  Laterality: Right;   MASTOIDECTOMY     L ear   MITRAL VALVE REPAIR     MITRAL VALVE REPAIR N/A 08/19/2020   Procedure: MITRAL VALVE REPAIR;  Surgeon: Tonny Bollman, MD;  Location: [facility] INVASIVE CV LAB;  Service: Cardiovascular;  Laterality: N/A;   PROSTATECTOMY     RIGHT/LEFT HEART CATH AND CORONARY ANGIOGRAPHY N/A 08/06/2020   Procedure: RIGHT/LEFT HEART CATH AND CORONARY ANGIOGRAPHY;  Surgeon: Tonny Bollman, MD;  Location: [facility] INVASIVE CV LAB;  Service: Cardiovascular;  Laterality: N/A;   TEE WITHOUT CARDIOVERSION N/A 08/06/2020   Procedure: TRANSESOPHAGEAL ECHOCARDIOGRAM (TEE);  Surgeon: Thurmon Fair, MD;  Location: [facility] ENDOSCOPY;  Service: Cardiovascular;  Laterality: N/A;   TEE WITHOUT CARDIOVERSION  08/19/2020   TEE WITHOUT CARDIOVERSION N/A 08/19/2020   Procedure: TRANSESOPHAGEAL ECHOCARDIOGRAM  (TEE);  Surgeon: Tonny Bollman, MD;  Location: [facility] INVASIVE CV LAB;  Service: Cardiovascular;  Laterality: N/A;   Family History  Problem Relation Age of Onset   CAD Neg Hx    Social History   Socioeconomic History   Marital status: Married    Spouse name: Not on file   Number of children: 2   Years of education: Not on file   Highest education level: Not on file  Occupational History   Not on file  Tobacco Use   Smoking status: Former    Current packs/day: 0.00    Types: Cigarettes    Quit date: 1980    Years since quitting: 44.6   Smokeless tobacco: Never   Tobacco comments:    Smoked for 15 years  Vaping Use   Vaping status: Never Used  Substance and Sexual Activity   Alcohol use: Not Currently    Comment: rare   Drug use: No   Sexual activity: Not Currently  Other Topics Concern   Not on file  Social History Narrative   Not on file   Social Determinants of Health   Financial Resource Strain: Not on file  Food Insecurity: No Food Insecurity (07/02/2022)   Hunger Vital Sign    Worried About Running Out of Food in the Last Year: Never true    Ran Out of Food in the Last Year: Never true  Transportation Needs: No Transportation Needs (07/02/2022)   PRAPARE - Administrator, Civil Service (Medical): No    Lack of Transportation (Non-Medical): No  Physical Activity: Not on file  Stress: Not on file  Social Connections: Unknown (10/18/2021)   Received from [facility]   Social Network    Social Network: Not on file   Allergies  Allergen Reactions   Simvastatin Other (See Comments)    MUSCLE ACHES REAL BAD   Atorvastatin     Other reaction(s): stiffness, decreased energy   Lisinopril Cough   Rosuvastatin     Other reaction(s): weakness    Medications   (Not in a hospital admission)     Current Facility-Administered Medications:    tenecteplase (TNKASE) 50 MG injection for Stroke, , , ,   Current Outpatient Medications:     acetaminophen (TYLENOL) 325 MG tablet, Take 1-2 tablets (325-650 mg total) by mouth every 4 (four) hours as needed for mild pain., Disp: , Rfl:    aspirin EC 81 MG tablet, Take 81 mg by mouth daily. Swallow whole., Disp: , Rfl:    ciprofloxacin-dexamethasone (CIPRODEX) OTIC suspension, Place 4 drops into the left ear 2 (two) times daily as needed (ear infection)., Disp: , Rfl:    docusate sodium (COLACE) 100 MG capsule, Take 1 capsule (100 mg total) by mouth 2 (two) times daily., Disp: 10 capsule, Rfl: 0   FIBER PO, Take 1 capsule by mouth daily., Disp: , Rfl:    furosemide (  LASIX) 20 MG tablet, Take 1 tablet (20 mg total) by mouth daily., Disp: 90 tablet, Rfl: 3   gabapentin (NEURONTIN) 100 MG capsule, Take 1 capsule (100 mg total) by mouth at bedtime., Disp: 30 capsule, Rfl: 0   losartan (COZAAR) 25 MG tablet, TAKE 1 TABLET DAILY, Disp: 90 tablet, Rfl: 3   Menthol-Methyl Salicylate (MUSCLE RUB) 10-15 % CREA, Apply 1 Application topically 2 (two) times daily as needed for muscle pain., Disp: , Rfl: 0   metoprolol tartrate (LOPRESSOR) 25 MG tablet, Take 25 mg by mouth 2 (two) times daily., Disp: , Rfl:    Multiple Vitamin (MULTIVITAMIN WITH MINERALS) TABS tablet, Take 1 tablet by mouth daily., Disp: , Rfl:    nitroGLYCERIN (NITROSTAT) 0.4 MG SL tablet, PLACE 1 TABLET UNDER THE TONGUE AT ONSET OF CHEST PAIN EVERY 5 MINTUES UP TO 3 TIMES AS NEEDED, Disp: 25 tablet, Rfl: 3   polyethylene glycol (MIRALAX / GLYCOLAX) 17 g packet, Take 17 g by mouth daily as needed for mild constipation., Disp: 14 each, Rfl: 0   Polyvinyl Alcohol-Povidone (REFRESH OP), Place 1 drop into both eyes daily as needed (dry eye)., Disp: , Rfl:   Vitals   Vitals:   01/05/23 1346 01/05/23 1348 01/05/23 1348 01/05/23 1400  BP:   (!) 158/83 (!) 145/78  Pulse:  95 100 86  Resp: (!) 26 20 19 18   Temp:   (!) 97.1 F (36.2 C)   TempSrc:   Tympanic   SpO2:  93% 94% 94%     There is no height or weight on file to calculate  BMI.  Physical Exam   Exam performed over telemedicine with 2-way video and audio communication and with assistance of bedside RN  Physical Exam Gen: obtunded, minimally arousable to sternal rub Resp: normal WOB CV: extremities appear well-perfused  Neuro: *MS: obtunded, minimally arousable to sternal rub, does not follow commands *Speech: no intelligible speech *CN: PERRL 3mm, R gaze deviation, face symmetric at rest *Motor & sensory: withdraws to noxious stimuli in all extremities *Coordination:  UTA *Reflexes:  UTA 2/2 tele-exam *Gait: deferred  NIHSS  1a Level of Conscious.: 2 1b LOC Questions: 2 1c LOC Commands: 2 2 Best Gaze: 2 3 Visual: 0 4 Facial Palsy: 0 5a Motor Arm - left: 2 5b Motor Arm - Right: 2 6a Motor Leg - Left: 3 6b Motor Leg - Right: 3 7 Limb Ataxia: 0 8 Sensory: 0 9 Best Language: 2 10 Dysarthria: 2 11 Extinct. and Inatten.: 0  TOTAL: 22   Premorbid mRS = 3   Labs   CBC:  Recent Labs  Lab 01/05/23 1247  WBC 11.8*  NEUTROABS 6.2  HGB 10.8*  HCT 35.6*  MCV 96.5  PLT 312    Basic Metabolic Panel:  Lab Results  Component Value Date   NA 136 01/05/2023   K 3.7 01/05/2023   CO2 17 (L) 01/05/2023   GLUCOSE 129 (H) 01/05/2023   BUN 29 (H) 01/05/2023   CREATININE 1.75 (H) 01/05/2023   CALCIUM 8.4 (L) 01/05/2023   GFRNONAA 37 (L) 01/05/2023   GFRAA 45 (L) 07/28/2020   Lipid Panel:  Lab Results  Component Value Date   LDLCALC 69 04/03/2022   HgbA1c:  Lab Results  Component Value Date   HGBA1C 5.5 04/03/2022   Urine Drug Screen: No results found for: "LABOPIA", "COCAINSCRNUR", "LABBENZ", "AMPHETMU", "THCU", "LABBARB"  Alcohol Level     Component Value Date/Time   ETH <10 01/05/2023 1247  CT Head without contrast: No acute findings  CT angio Head and Neck with contrast: 1. No emergent large vessel occlusion. 2. Calcified plaque in the proximal right internal carotid artery resulting in approximately 70-80%  stenosis. 3. Mixed plaque in the left common carotid artery resulting in approximately 60% stenosis. 4. Diminutive left PCA beyond the P2 segment. Otherwise, patent intracranial vasculature without other proximal high-grade stenosis or occlusion. 5. 3 mm left MCA bifurcation aneurysm, unchanged.  MRI brain, DWI/ADC/FLAIR only There was no large area of restricted diffusion, a small amount of increased diffusion along lateral ventricle adjacent to L thalamus was favored to be artifactual. Motion degraded  CNS imaging personally reviewed  Impression   This is a 89 yo man with hx CAD, prior CVA, HTN, prostate cancer, s/p MV clip implantation who presents after episode of R sided weakness and aphasia f/b seizure that resolved with versed but persistent R gaze deviation. CT/CTA showed no acute process on stroke code MRI showed no clear restricted diffusion therefore TNK was not administered 2/2 no acute stroke. He received an additional 2mg  IV ativan with no improvement in mental status or resolution of gaze deviation therefore transfer was recommended to [facility] ED for STAT EEG and further neurology guidance.  Recommendations   - Pt en route to [facility] ED - STAT EEG and notify neurology on patient arrival - Dispo (ICU vs floor) to be determined by neurology based on EEG findings - Patient left [facility] before keppra was administered, should receive 60mg /kg on arrival to [facility] f/b 500mg  bid  Neurology will continue to follow at [facility] ______________________________________________________________________   Thank you for the opportunity to take part in the care of this patient. If you have any further questions, please contact the neurology consultation attending.  Signed,  Bing Neighbors, MD [facility] [phone]  If 7pm- 7am, please page neurology on call as listed in AMION.  **Any copied and pasted documentation in this note was written by me in another application not billed for  and pasted by me into this document.

## 2023-01-05 NOTE — ED Notes (Signed)
Patient transported to CT 

## 2023-01-05 NOTE — Progress Notes (Deleted)
EEG complete - results pending 

## 2023-01-05 NOTE — Progress Notes (Signed)
Patient evaluated the emergency department, continues to be aphasic, but awake, alert, moving everything with cortically driven movements, low suspicion for ongoing seizure but will get overnight EEG and load with Keppra.  Ritta Slot, MD [facility] [phone]  If 7pm- 7am, please page neurology on call as listed in AMION.

## 2023-01-05 NOTE — ED Triage Notes (Signed)
Pt BIB [facility] for c/o code stroke with LKW at 10 am; son reported to ems that pt was alert and talking and then suddenly at 10am pt stopped talking and had continuous gaze to the right  Ems arrived and once pt in the truck pt witnessed to have a full body seizure.    When pt arrived to ED pt unable to speak and has purposeful movement of all extremities but unable to speak and has head turned to right  Pt unable to follow any commands  BP with ems 152/75, HR 117, cbg 130  Pt has bruise to left shoulder

## 2023-01-05 NOTE — ED Notes (Signed)
In CT with pt; Ativan 2mg  ordered and given due to patient moving during exam

## 2023-01-05 NOTE — ED Notes (Signed)
[facility] has been called for transport ED to ED.

## 2023-01-06 DIAGNOSIS — R569 Unspecified convulsions: Secondary | ICD-10-CM | POA: Diagnosis not present

## 2023-01-06 DIAGNOSIS — H518 Other specified disorders of binocular movement: Secondary | ICD-10-CM | POA: Diagnosis present

## 2023-01-06 DIAGNOSIS — Z9079 Acquired absence of other genital organ(s): Secondary | ICD-10-CM | POA: Diagnosis not present

## 2023-01-06 DIAGNOSIS — I129 Hypertensive chronic kidney disease with stage 1 through stage 4 chronic kidney disease, or unspecified chronic kidney disease: Secondary | ICD-10-CM | POA: Diagnosis present

## 2023-01-06 DIAGNOSIS — N1832 Chronic kidney disease, stage 3b: Secondary | ICD-10-CM | POA: Diagnosis present

## 2023-01-06 DIAGNOSIS — T4275XA Adverse effect of unspecified antiepileptic and sedative-hypnotic drugs, initial encounter: Secondary | ICD-10-CM | POA: Diagnosis not present

## 2023-01-06 DIAGNOSIS — I252 Old myocardial infarction: Secondary | ICD-10-CM | POA: Diagnosis not present

## 2023-01-06 DIAGNOSIS — D631 Anemia in chronic kidney disease: Secondary | ICD-10-CM | POA: Diagnosis present

## 2023-01-06 DIAGNOSIS — Z87891 Personal history of nicotine dependence: Secondary | ICD-10-CM | POA: Diagnosis not present

## 2023-01-06 DIAGNOSIS — I2510 Atherosclerotic heart disease of native coronary artery without angina pectoris: Secondary | ICD-10-CM | POA: Diagnosis present

## 2023-01-06 DIAGNOSIS — E785 Hyperlipidemia, unspecified: Secondary | ICD-10-CM | POA: Diagnosis present

## 2023-01-06 DIAGNOSIS — I340 Nonrheumatic mitral (valve) insufficiency: Secondary | ICD-10-CM | POA: Diagnosis present

## 2023-01-06 DIAGNOSIS — J45909 Unspecified asthma, uncomplicated: Secondary | ICD-10-CM | POA: Diagnosis present

## 2023-01-06 DIAGNOSIS — Z7401 Bed confinement status: Secondary | ICD-10-CM | POA: Diagnosis not present

## 2023-01-06 DIAGNOSIS — E876 Hypokalemia: Secondary | ICD-10-CM | POA: Diagnosis present

## 2023-01-06 DIAGNOSIS — Z96641 Presence of right artificial hip joint: Secondary | ICD-10-CM | POA: Diagnosis present

## 2023-01-06 DIAGNOSIS — Z66 Do not resuscitate: Secondary | ICD-10-CM | POA: Diagnosis present

## 2023-01-06 DIAGNOSIS — N179 Acute kidney failure, unspecified: Secondary | ICD-10-CM | POA: Diagnosis present

## 2023-01-06 DIAGNOSIS — D72829 Elevated white blood cell count, unspecified: Secondary | ICD-10-CM | POA: Diagnosis present

## 2023-01-06 DIAGNOSIS — K219 Gastro-esophageal reflux disease without esophagitis: Secondary | ICD-10-CM | POA: Diagnosis present

## 2023-01-06 DIAGNOSIS — R6889 Other general symptoms and signs: Secondary | ICD-10-CM | POA: Diagnosis not present

## 2023-01-06 DIAGNOSIS — I69351 Hemiplegia and hemiparesis following cerebral infarction affecting right dominant side: Secondary | ICD-10-CM | POA: Diagnosis not present

## 2023-01-06 DIAGNOSIS — Z8546 Personal history of malignant neoplasm of prostate: Secondary | ICD-10-CM | POA: Diagnosis not present

## 2023-01-06 DIAGNOSIS — R4701 Aphasia: Secondary | ICD-10-CM | POA: Diagnosis present

## 2023-01-06 DIAGNOSIS — Z955 Presence of coronary angioplasty implant and graft: Secondary | ICD-10-CM | POA: Diagnosis not present

## 2023-01-06 DIAGNOSIS — G928 Other toxic encephalopathy: Secondary | ICD-10-CM | POA: Diagnosis not present

## 2023-01-06 DIAGNOSIS — I739 Peripheral vascular disease, unspecified: Secondary | ICD-10-CM | POA: Diagnosis present

## 2023-01-06 MED ORDER — ASPIRIN 81 MG PO TBEC
81.0000 mg | DELAYED_RELEASE_TABLET | Freq: Every day | ORAL | Status: DC
  Administered 2023-01-07 – 2023-01-10 (×4): 81 mg via ORAL
  Filled 2023-01-06 (×4): qty 1

## 2023-01-06 MED ORDER — ENOXAPARIN SODIUM 30 MG/0.3ML IJ SOSY
30.0000 mg | PREFILLED_SYRINGE | INTRAMUSCULAR | Status: DC
  Administered 2023-01-06 – 2023-01-09 (×4): 30 mg via SUBCUTANEOUS
  Filled 2023-01-06 (×4): qty 0.3

## 2023-01-06 MED ORDER — VALPROATE SODIUM 100 MG/ML IV SOLN
500.0000 mg | Freq: Three times a day (TID) | INTRAVENOUS | Status: DC
  Administered 2023-01-06 – 2023-01-07 (×3): 500 mg via INTRAVENOUS
  Filled 2023-01-06 (×2): qty 500
  Filled 2023-01-06 (×2): qty 5
  Filled 2023-01-06: qty 500
  Filled 2023-01-06: qty 5

## 2023-01-06 NOTE — Procedures (Addendum)
Patient Name: Adam Arellano  MRN: [id number]  Epilepsy Attending: Windell Norfolk  Referring Physician/Provider:  Date: 01/06/2023 Start time: 0230  End Time: 1625 Duration:  13 hours 55 minutes  Patient history: 89 year old man with history of stroke ,HTN, HLD, cognitive impairment, seizure. EEG to assess for seizures  Level of alertness: Restless and  lethargic   AEDs during EEG study: Levetiracetam  Technical aspects: This EEG study was done with scalp electrodes positioned according to the 10-20 International system of electrode placement. Electrical activity was reviewed with band pass filter of 1-70Hz , sensitivity of 7 uV/mm, display speed of 4mm/sec with a 60Hz  notched filter applied as appropriate. EEG data were recorded continuously and digitally stored.  Video monitoring was available and reviewed as appropriate.  Description: The background consists of delta-theta activity of moderate voltage (25-35 uV) seen predominantly in posterior head regions. Drowsiness was characterized by attenuation of the posterior background rhythm. Sleep was not seen. EEG showed continuous generalized polymorphic sharply contoured 3 to 6 Hz theta-delta slowing. There was additional left posterior quadrant focal slowing.  Hyperventilation and photic stimulation were not performed.     ABNORMALITY - Continuous slow, generalized - Left posterior quadrant focal slowing   IMPRESSION: This study is suggestive of mild to moderate diffuse encephalopathy, nonspecific etiology but likely related to sedation, toxic-metabolic etiology, anoxic/hypoxic brain injury. There is also evidence of cortical dysfunction arising from left parietoccipital region, nonspecific etiology, likely secondary to underlying structural abnormality. No seizures seen.    

## 2023-01-06 NOTE — Progress Notes (Signed)
Subjective: Still confused, somewhat agitated but much better than yesterday  Exam: Vitals:   01/06/23 0735 01/06/23 1140  BP: (!) 169/75 (!) 168/86  Pulse: 61 68  Resp: 18 18  Temp: 97.9 F (36.6 C) 97.8 F (36.6 C)  SpO2: 97% (!) 88%   Gen: In bed, NAD Resp: non-labored breathing, no acute distress Abd: soft, nt  Neuro: MS: Awake, able to tell me his name and identify his daughter.  He is not oriented, and some of his speech seems nonsensical. CN: He endorses seeing fingers wiggle in both hemifield's, face is symmetric Motor: Moves all extremities spontaneously, but does not cooperate with formal strength testing Sensory: As above  Pertinent Labs: Creatinine 1.75  Impression: 89 year old male with dementia with new onset seizures.  The MRI questioned a punctate area of change in the thalamus, though this could be postictal change.  He is already on antiplatelet therapy with aspirin at home, and given his advanced dementia I discussed with his daughter repeating the study, but she would favor holding off on that.  I think this is very reasonable.  With his agitation, Keppra might not be the best choice, and I will change him to Depakote.  Recommendations: 1) discontinue Keppra 2) start Depacon 500 mg 3 times daily, once taking p.o. reliably can change to Depakote 750 twice daily 3) can discontinue EEG 4) neurology will continue to follow  Ritta Slot, MD [facility] [phone]  If 7pm- 7am, please page neurology on call as listed in AMION.

## 2023-01-06 NOTE — Progress Notes (Cosign Needed Addendum)
LTM EEG running - no initial skin breakdown - push button tested - [facility] notified, billing completed, Pt very difficult, took multiple people for hookup. Neuro ordered wrist restraints, head wrapped to protect wires

## 2023-01-06 NOTE — Progress Notes (Signed)
LTM EEG discontinued - no skin breakdown at [facility].  GMD/MB

## 2023-01-06 NOTE — Plan of Care (Signed)
  Problem: Education: Goal: Expressions of having a comfortable level of knowledge regarding the disease process will increase Outcome: Progressing   Problem: Coping: Goal: Ability to adjust to condition or change in health will improve Outcome: Progressing Goal: Ability to identify appropriate support needs will improve Outcome: Progressing   Problem: Health Behavior/Discharge Planning: Goal: Compliance with prescribed medication regimen will improve Outcome: Progressing   Problem: Medication: Goal: Risk for medication side effects will decrease Outcome: Progressing   Problem: Clinical Measurements: Goal: Complications related to the disease process, condition or treatment will be avoided or minimized Outcome: Progressing Goal: Diagnostic test results will improve Outcome: Progressing   Problem: Safety: Goal: Verbalization of understanding the information provided will improve Outcome: Progressing   Problem: Self-Concept: Goal: Level of anxiety will decrease Outcome: Progressing Goal: Ability to verbalize feelings about condition will improve Outcome: Progressing   Problem: Education: Goal: Knowledge of General Education information will improve Description: Including pain rating scale, medication(s)/side effects and non-pharmacologic comfort measures Outcome: Progressing   Problem: Health Behavior/Discharge Planning: Goal: Ability to manage health-related needs will improve Outcome: Progressing   Problem: Clinical Measurements: Goal: Ability to maintain clinical measurements within normal limits will improve Outcome: Progressing Goal: Will remain free from infection Outcome: Progressing Goal: Diagnostic test results will improve Outcome: Progressing Goal: Respiratory complications will improve Outcome: Progressing Goal: Cardiovascular complication will be avoided Outcome: Progressing   Problem: Activity: Goal: Risk for activity intolerance will  decrease Outcome: Progressing   Problem: Nutrition: Goal: Adequate nutrition will be maintained Outcome: Progressing   Problem: Coping: Goal: Level of anxiety will decrease Outcome: Progressing   Problem: Elimination: Goal: Will not experience complications related to bowel motility Outcome: Progressing Goal: Will not experience complications related to urinary retention Outcome: Progressing   Problem: Pain Managment: Goal: General experience of comfort will improve Outcome: Progressing   Problem: Safety: Goal: Ability to remain free from injury will improve Outcome: Progressing   Problem: Skin Integrity: Goal: Risk for impaired skin integrity will decrease Outcome: Progressing   Problem: Safety: Goal: Non-violent Restraint(s) Outcome: Progressing   

## 2023-01-06 NOTE — Procedures (Signed)
[facility]  Stat EEG Report  Video Performed: Performed   Demographics: Patient Name:   Adam, Arellano  Date of Birth:   1931-12-03  Identification Number:   MRN - [id number]  Study Times:  Study Start Time:   01/06/2023 01:30:00 Study End Time:   01/06/2023 02:30:00  Duration:   60 minutes  Indication(s): Encephalopathy  Technical Summary:  This EEG was performed utilizing standard International 10-20 System of electrode placement. One channel electrocardiogram was monitored. Data were obtained and interpreted utilizing referential montage recording, with reformatting to longitudinal, transverse bipolar, and referential montages as necessary for interpretation.  State(s):       Awake      Drowsy   Activation Procedures: Hyperventilation: Not performed Photic Stimulation: Not performed   EEG Description:  The awake background is continuous and symmetric characterized by generalized low-voltage fast activity with admixed theta delta slowing seen. Occasional brief periods of sick contoured triphasic waves seen. No definite stage II sleep architecture seen. No seizures, epileptiform discharges, or evolving ictal patterns seen. Much of the recording is obscured by artifact due to constant patient restlessness and motion.  Impression:  Abnormal EEG due to:  1. Excess generalized low-voltage fast activity currently consistent with medication effect.  2.Moderate generalized slowing of the background with bluntly contoured triphasic waves suggestive of a moderate degree of diffuse or multifocal cerebral dysfunction potentially toxic or metabolic in nature.  3.No seizures, epileptiform discharges, or evolving ictal patterns seen.   Addendum:  JDN does not have access to the database for this EEG he created a ticket with IT. Still not able to get in as of 0650 EST 8/3.  0800 - PG and NB unable to access.   Dr  Jossie Ng      [facility] For Inpatient follow-up with [facility] physician please call [facility] [phone]. This is not an outpatient service. Post hospital discharge, please contact hospital directly.   Please call or reconsult our service if there are any clinical or diagnostic changes.

## 2023-01-06 NOTE — Progress Notes (Signed)
PROGRESS NOTE    Adam Arellano  WUJ:[id number] DOB: 02-Dec-1931 DOA: 01/05/2023 PCP: Delma Officer, PA   Brief Narrative:  89 year old with history of CVA, anemia, PVD, asthma, CAD, LPR, HTN, CKD stage IIIb, macular degeneration, mitral valve regurgitation status post repair, prostate cancer status post prostatectomy comes to the hospital with right gaze preference and not able to follow commands.  And route patient had tonic-clonic seizure and received Versed.  Initially code stroke was called and patient was seen by neurology.  Initial CT head and CTA head and neck did not show any acute pathology.  MRI brain was negative.  Did show chronic changes.  Patient was given Keppra and Ativan.   Assessment & Plan:  Principal Problem:   Seizure (HCC) Active Problems:   Primary hypertension   Nonrheumatic mitral valve regurgitation   H/O mitral valve repair   PVD (peripheral vascular disease) (HCC)   Coronary artery disease   Advanced nonexudative age-related macular degeneration of right eye without subfoveal involvement   Advanced nonexudative age-related macular degeneration of left eye without subfoveal involvement   Anemia in chronic kidney disease   Stage 3b chronic kidney disease (HCC)   History of malignant neoplasm of prostate   Laryngopharyngeal reflux (LPR)   History of CVA (cerebrovascular accident)   Asthma, chronic   H/O radical prostatectomy   Mild cognitive impairment     Seizures CT head, MRI brain, CT head and neck are negative for acute pathology.  Does show chronic changes.  Due to evidence of seizure, patient placed on EEG and loaded with Keppra.  Neurology team is following.   History of CVA Resume once able to tolerate p.o. meds  Hypokalemia - As needed repletion   Hypertension IV as needed   CAD PVD Hold home aspirin, losartan and metoprolol.  IV as needed   Anemia Stable hemoglobin at 10   CKD 3b Creatinine stable at 1.7   Mild cognitive  impairment Chronic   Mitral valve regurgitation s/p repair -Stable   History of prostate cancer Status post prostatectomy.  Follow-up outpatient   We will see how patient does with PT/OT.  At this time daughter does not want any escalation of care.  Final disposition pending neurology evaluation, EEG and therapy evaluation   DVT prophylaxis: Lovenox Code Status: DNR Family Communication: Daughter present at bedside On going neuroevaluation and EEG.       Diet Orders (From admission, onward)     Start     Ordered   01/05/23 1754  Diet NPO time specified  Diet effective now        01/05/23 1753            Subjective: Seen and examined at bedside.  Patient is resting currently hooked up to EEG.  Answers very basic questions  I had extensive conversation with patient's daughter.  She tells me that patient has been having some signs of dementia and sundowning and being very forgetful and last several weeks to months.  This has progressed.   Examination:  General exam: Appears calm and comfortable  Respiratory system: Clear to auscultation. Respiratory effort normal. Cardiovascular system: S1 & S2 heard, RRR. No JVD, murmurs, rubs, gallops or clicks. No pedal edema. Gastrointestinal system: Abdomen is nondistended, soft and nontender. No organomegaly or masses felt. Normal bowel sounds heard. Central nervous system: Alert and oriented name Extremities: Symmetric 5 x 5 power. Skin: No rashes, lesions or ulcers Psychiatry: Judgement and insight appears poor  Objective:  Vitals:   01/05/23 1925 01/05/23 2329 01/06/23 0350 01/06/23 0735  BP: (!) 163/71 (!) 156/83 (!) 168/86 (!) 169/75  Pulse: 83 70 79 61  Resp: 18 17 18 18   Temp: 98.4 F (36.9 C) 98 F (36.7 C) 98 F (36.7 C) 97.9 F (36.6 C)  TempSrc: Axillary Axillary Axillary Oral  SpO2: 92% 90% 90% 97%    Intake/Output Summary (Last 24 hours) at 01/06/2023 0819 Last data filed at 01/06/2023 0557 Gross per 24  hour  Intake 40.06 ml  Output --  Net 40.06 ml   There were no vitals filed for this visit.  Scheduled Meds:  enoxaparin (LOVENOX) injection  40 mg Subcutaneous Q24H   sodium chloride flush  3 mL Intravenous Q12H   Continuous Infusions:  levETIRAcetam 432 mL/hr at 01/06/23 0557    Nutritional status     There is no height or weight on file to calculate BMI.  Data Reviewed:   CBC: Recent Labs  Lab 01/05/23 1247 01/06/23 0253  WBC 11.8* 7.9  NEUTROABS 6.2  --   HGB 10.8* 9.8*  HCT 35.6* 31.3*  MCV 96.5 92.1  PLT 312 249   Basic Metabolic Panel: Recent Labs  Lab 01/05/23 1247 01/06/23 0253  NA 136 138  K 3.7 3.4*  CL 107 106  CO2 17* 24  GLUCOSE 129* 111*  BUN 29* 22  CREATININE 1.75* 1.63*  CALCIUM 8.4* 8.8*   GFR: CrCl cannot be calculated (Unknown ideal weight.). Liver Function Tests: Recent Labs  Lab 01/05/23 1247 01/06/23 0253  AST 17 13*  ALT 11 9  ALKPHOS 81 79  BILITOT 0.2* 0.6  PROT 6.3* 5.8*  ALBUMIN 3.3* 3.0*   No results for input(s): "LIPASE", "AMYLASE" in the last 168 hours. No results for input(s): "AMMONIA" in the last 168 hours. Coagulation Profile: Recent Labs  Lab 01/05/23 1247  INR 1.1   Cardiac Enzymes: No results for input(s): "CKTOTAL", "CKMB", "CKMBINDEX", "TROPONINI" in the last 168 hours. BNP (last 3 results) No results for input(s): "PROBNP" in the last 8760 hours. HbA1C: No results for input(s): "HGBA1C" in the last 72 hours. CBG: Recent Labs  Lab 01/05/23 1247  GLUCAP 114*   Lipid Profile: No results for input(s): "CHOL", "HDL", "LDLCALC", "TRIG", "CHOLHDL", "LDLDIRECT" in the last 72 hours. Thyroid Function Tests: No results for input(s): "TSH", "T4TOTAL", "FREET4", "T3FREE", "THYROIDAB" in the last 72 hours. Anemia Panel: No results for input(s): "VITAMINB12", "FOLATE", "FERRITIN", "TIBC", "IRON", "RETICCTPCT" in the last 72 hours. Sepsis Labs: No results for input(s): "PROCALCITON", "LATICACIDVEN" in  the last 168 hours.  No results found for this or any previous visit (from the past 240 hour(s)).       Radiology Studies: MR BRAIN WO CONTRAST  Result Date: 01/05/2023 CLINICAL DATA:  Code stroke EXAM: MRI HEAD WITHOUT CONTRAST TECHNIQUE: Multiplanar, multiecho pulse sequences of the brain and surrounding structures were obtained without intravenous contrast. COMPARISON:  Same-day CT head, brain MRI 04/03/2022 FINDINGS: A truncated protocol was performed. Motion degraded DWI and FLAIR sequences were obtained. Brain: There is no large vessel territorial infarct. There is mildly elevated DWI signal in the left thalamus along the left lateral ventricle which may be artifactual or a small acute infarct. The axial FLAIR sequence is markedly motion degraded. There is parenchymal volume loss with prominence of the ventricular system and extra-axial CSF spaces. There is a remote left PCA infarct, better seen on same-day CT. There is no midline shift. Vascular: Not well assessed. Skull and upper  cervical spine: Not well assessed. Sinuses/Orbits: The imaged paranasal sinuses are clear. The globes and orbits are not well assessed. Other: None. IMPRESSION: 1. Truncated study with markedly motion degraded DWI and FLAIR sequences obtained. 2. Possible small acute infarct versus artifact in the left thalamus. Electronically Signed   By: Lesia Hausen M.D.   On: 01/05/2023 14:07   CT ANGIO HEAD NECK W WO CM (CODE STROKE)  Result Date: 01/05/2023 CLINICAL DATA:  Right-sided weakness and aphasia EXAM: CT ANGIOGRAPHY HEAD AND NECK WITH AND WITHOUT CONTRAST TECHNIQUE: Multidetector CT imaging of the head and neck was performed using the standard protocol during bolus administration of intravenous contrast. Multiplanar CT image reconstructions and MIPs were obtained to evaluate the vascular anatomy. Carotid stenosis measurements (when applicable) are obtained utilizing NASCET criteria, using the distal internal carotid  diameter as the denominator. RADIATION DOSE REDUCTION: This exam was performed according to the departmental dose-optimization program which includes automated exposure control, adjustment of the mA and/or kV according to patient size and/or use of iterative reconstruction technique. CONTRAST:  75mL OMNIPAQUE IOHEXOL 350 MG/ML SOLN COMPARISON:  MRA/MR head 04/03/2022 FINDINGS: CTA NECK FINDINGS Aortic arch: There is calcified plaque in the imaged aortic arch. The origins of the major branch vessels are patent. The subclavian arteries are patent to the level imaged. Right carotid system: The right common carotid artery is patent. There is bulky plaque in the proximal internal carotid artery resulting in up to approximately 70-80% stenosis. The distal internal carotid artery is patent. The external carotid artery is patent. There is no evidence of dissection or aneurysm. Left carotid system: The left common carotid artery is patent with mixed plaque resulting in up to approximately 60% stenosis. There is mild plaque at the bifurcation without significant stenosis of the internal carotid artery. The external carotid artery is patent. There is no evidence of dissection or aneurysm. Vertebral arteries: The vertebral arteries are patent, without hemodynamically significant stenosis or occlusion there is no evidence of dissection or aneurysm. Skeleton: There is no acute osseous abnormality or suspicious osseous lesion. There is no visible canal hematoma. There is advanced multilevel degenerative change in the cervical spine without evidence of high-grade spinal canal stenosis. Other neck: The soft tissues of the neck are unremarkable. Upper chest: There is biapical pleural scarring. Review of the MIP images confirms the above findings CTA HEAD FINDINGS Anterior circulation: There is calcified plaque in the intracranial ICAs without significant stenosis or occlusion. The bilateral MCAs are patent, without proximal stenosis  or occlusion. The bilateral ACAS are patent, without proximal stenosis or occlusion. There is a 3 mm left MCA bifurcation aneurysm, unchanged. Posterior circulation: The bilateral V4 segments are patent. The basilar artery is patent. The major cerebellar arteries appear patent. The left PCA is diminutive beyond the P2 segment. Otherwise, the PCAs are patent, without other proximal high-grade stenosis or occlusion. Posterior communicating arteries are not identified. There is no aneurysm or AVM. Venous sinuses: Patent. Anatomic variants: None. Review of the MIP images confirms the above findings IMPRESSION: 1. No emergent large vessel occlusion. 2. Calcified plaque in the proximal right internal carotid artery resulting in approximately 70-80% stenosis. 3. Mixed plaque in the left common carotid artery resulting in approximately 60% stenosis. 4. Diminutive left PCA beyond the P2 segment. Otherwise, patent intracranial vasculature without other proximal high-grade stenosis or occlusion. 5. 3 mm left MCA bifurcation aneurysm, unchanged. Electronically Signed   By: Lesia Hausen M.D.   On: 01/05/2023 13:43   CT  HEAD CODE STROKE WO CONTRAST  Result Date: 01/05/2023 CLINICAL DATA:  Code stroke.  Neuro deficit, acute, stroke suspected EXAM: CT HEAD WITHOUT CONTRAST TECHNIQUE: Contiguous axial images were obtained from the base of the skull through the vertex without intravenous contrast. RADIATION DOSE REDUCTION: This exam was performed according to the departmental dose-optimization program which includes automated exposure control, adjustment of the mA and/or kV according to patient size and/or use of iterative reconstruction technique. COMPARISON:  CT head October 29, 23. FINDINGS: Motion limited. Brain: Remote left PCA territory infarct. Remote left posterior limb of the internal capsule infarct. No evidence of acute large vascular territory infarct, acute hemorrhage, mass lesion or midline shift. Cerebral atrophy  with ex vacuo ventricular dilation. Vascular: No hyperdense vessel. Skull: No acute fracture. Sinuses/Orbits: Clear sinuses.  No acute orbital findings. Other: No mastoid effusions. ASPECTS [facility] Stroke Program Early CT Score) Total score (0-10 with 10 being normal): 10. IMPRESSION: 1. No evidence of acute large vascular territory infarct or acute hemorrhage. ASPECTS is 10. 2. Remote left PCA infarcts and chronic microvascular ischemic change. Code stroke imaging results were communicated on 01/05/2023 at 1:22 pm to provider Charm Barges via telephone, who verbally acknowledged these results. Electronically Signed   By: Feliberto Harts M.D.   On: 01/05/2023 13:24           LOS: 0 days   Time spent= 35 mins     Joline Maxcy, MD [facility]  If 7PM-7AM, please contact night-coverage  01/06/2023, 8:19 AM

## 2023-01-07 DIAGNOSIS — R569 Unspecified convulsions: Secondary | ICD-10-CM | POA: Diagnosis not present

## 2023-01-07 LAB — BASIC METABOLIC PANEL
Anion gap: 13 (ref 5–15)
BUN: 22 mg/dL (ref 8–23)
CO2: 22 mmol/L (ref 22–32)
Calcium: 8.7 mg/dL — ABNORMAL LOW (ref 8.9–10.3)
Chloride: 103 mmol/L (ref 98–111)
Creatinine, Ser: 1.79 mg/dL — ABNORMAL HIGH (ref 0.61–1.24)
GFR, Estimated: 36 mL/min — ABNORMAL LOW (ref >60)
Glucose, Bld: 83 mg/dL (ref 70–99)
Potassium: 3.6 mmol/L (ref 3.5–5.1)
Sodium: 138 mmol/L (ref 135–145)

## 2023-01-07 MED ORDER — SENNOSIDES-DOCUSATE SODIUM 8.6-50 MG PO TABS: 1.0000 | ORAL_TABLET | Freq: Every evening | ORAL | Status: DC | PRN

## 2023-01-07 MED ORDER — METOPROLOL TARTRATE 5 MG/5ML IV SOLN: 5.0000 mg | INTRAVENOUS | Status: DC | PRN

## 2023-01-07 MED ORDER — IPRATROPIUM-ALBUTEROL 0.5-2.5 (3) MG/3ML IN SOLN: 3.0000 mL | RESPIRATORY_TRACT | Status: DC | PRN

## 2023-01-07 MED ORDER — METOPROLOL TARTRATE 25 MG PO TABS
25.0000 mg | ORAL_TABLET | Freq: Two times a day (BID) | ORAL | Status: DC
  Administered 2023-01-07 – 2023-01-10 (×8): 25 mg via ORAL
  Filled 2023-01-07 (×8): qty 1

## 2023-01-07 MED ORDER — LOSARTAN POTASSIUM 25 MG PO TABS
25.0000 mg | ORAL_TABLET | Freq: Every day | ORAL | Status: DC
  Administered 2023-01-07 – 2023-01-10 (×4): 25 mg via ORAL
  Filled 2023-01-07 (×4): qty 1

## 2023-01-07 MED ORDER — TRAZODONE HCL 50 MG PO TABS
50.0000 mg | ORAL_TABLET | Freq: Every evening | ORAL | Status: DC | PRN
  Administered 2023-01-07: 50 mg via ORAL
  Filled 2023-01-07: qty 1

## 2023-01-07 MED ORDER — ONDANSETRON HCL 4 MG/2ML IJ SOLN: 4.0000 mg | Freq: Four times a day (QID) | INTRAMUSCULAR | Status: DC | PRN

## 2023-01-07 MED ORDER — DOCUSATE SODIUM 100 MG PO CAPS: 100.0000 mg | ORAL_CAPSULE | Freq: Two times a day (BID) | ORAL | Status: DC

## 2023-01-07 MED ORDER — DIVALPROEX SODIUM 250 MG PO DR TAB
750.0000 mg | DELAYED_RELEASE_TABLET | Freq: Two times a day (BID) | ORAL | Status: DC
  Administered 2023-01-07 – 2023-01-10 (×7): 750 mg via ORAL
  Filled 2023-01-07 (×7): qty 3

## 2023-01-07 MED ORDER — HYDRALAZINE HCL 20 MG/ML IJ SOLN
10.0000 mg | INTRAMUSCULAR | Status: DC | PRN
  Administered 2023-01-07 – 2023-01-08 (×2): 10 mg via INTRAVENOUS
  Filled 2023-01-07 (×2): qty 1

## 2023-01-07 MED ORDER — GUAIFENESIN 100 MG/5ML PO LIQD: 5.0000 mL | ORAL | Status: DC | PRN

## 2023-01-07 MED ORDER — GABAPENTIN 100 MG PO CAPS: 100.0000 mg | ORAL_CAPSULE | Freq: Every day | ORAL | Status: DC

## 2023-01-07 NOTE — Progress Notes (Signed)
Subjective: Continues to improve  Exam: Vitals:   01/07/23 1012 01/07/23 1110  BP: (!) 142/74 135/79  Pulse: 72 78  Resp: 18 20  Temp: 97.8 F (36.6 C) 97.6 F (36.4 C)  SpO2: 100% 100%   Gen: In bed, NAD Resp: non-labored breathing, no acute distress Abd: soft, nt  Neuro: MS: Awake, able to tell me his name and identify his daughter.  He knows that he is in the hospital, much more calm and interactive than previous days. CN: He endorses seeing fingers wiggle in both hemifield's, face is symmetric Motor: Moves all extremities spontaneously, but does not cooperate with formal strength testing Sensory: As above   Impression: 89 year old male with dementia with new onset seizures.  The MRI questioned a punctate area of change in the thalamus, though this could be postictal change.  He is already on antiplatelet therapy with aspirin at home, and given his advanced dementia I discussed with his daughter repeating the study, but she would favor holding off on that.  I think this is very reasonable.  With his agitation, he was changed from Keppra to Depakote and I would continue this.  Recommendations: 1) change Depakote from IV to p.o., 750 twice daily 2) follow-up with outpatient neurology (previously has seen [place]) 3) neurology will be available on an as-needed basis.  Ritta Slot, MD [facility] [phone]  If 7pm- 7am, please page neurology on call as listed in AMION.

## 2023-01-07 NOTE — Plan of Care (Signed)
  Problem: Self-Concept: Goal: Level of anxiety will decrease Outcome: Progressing   Problem: Nutrition: Goal: Adequate nutrition will be maintained Outcome: Progressing   Problem: Coping: Goal: Level of anxiety will decrease Outcome: Progressing   Problem: Elimination: Goal: Will not experience complications related to bowel motility Outcome: Progressing   Problem: Safety: Goal: Non-violent Restraint(s) Outcome: Progressing   Problem: Ischemic Stroke/TIA Tissue Perfusion: Goal: Complications of ischemic stroke/TIA will be minimized Outcome: Progressing   Problem: Self-Care: Goal: Ability to participate in self-care as condition permits will improve Outcome: Progressing Goal: Verbalization of feelings and concerns over difficulty with self-care will improve Outcome: Progressing

## 2023-01-07 NOTE — Plan of Care (Signed)
  Problem: Education: Goal: Expressions of having a comfortable level of knowledge regarding the disease process will increase Outcome: Progressing   Problem: Coping: Goal: Ability to adjust to condition or change in health will improve Outcome: Progressing Goal: Ability to identify appropriate support needs will improve Outcome: Progressing   Problem: Health Behavior/Discharge Planning: Goal: Compliance with prescribed medication regimen will improve Outcome: Progressing   Problem: Medication: Goal: Risk for medication side effects will decrease Outcome: Progressing   Problem: Clinical Measurements: Goal: Complications related to the disease process, condition or treatment will be avoided or minimized Outcome: Progressing Goal: Diagnostic test results will improve Outcome: Progressing   Problem: Safety: Goal: Verbalization of understanding the information provided will improve Outcome: Progressing   Problem: Self-Concept: Goal: Level of anxiety will decrease Outcome: Progressing Goal: Ability to verbalize feelings about condition will improve Outcome: Progressing   Problem: Education: Goal: Knowledge of General Education information will improve Description: Including pain rating scale, medication(s)/side effects and non-pharmacologic comfort measures Outcome: Progressing   Problem: Health Behavior/Discharge Planning: Goal: Ability to manage health-related needs will improve Outcome: Progressing   Problem: Clinical Measurements: Goal: Ability to maintain clinical measurements within normal limits will improve Outcome: Progressing Goal: Will remain free from infection Outcome: Progressing Goal: Diagnostic test results will improve Outcome: Progressing Goal: Respiratory complications will improve Outcome: Progressing Goal: Cardiovascular complication will be avoided Outcome: Progressing   Problem: Activity: Goal: Risk for activity intolerance will  decrease Outcome: Progressing   Problem: Nutrition: Goal: Adequate nutrition will be maintained Outcome: Progressing   Problem: Coping: Goal: Level of anxiety will decrease Outcome: Progressing   Problem: Elimination: Goal: Will not experience complications related to bowel motility Outcome: Progressing Goal: Will not experience complications related to urinary retention Outcome: Progressing   Problem: Pain Managment: Goal: General experience of comfort will improve Outcome: Progressing   Problem: Safety: Goal: Ability to remain free from injury will improve Outcome: Progressing   Problem: Skin Integrity: Goal: Risk for impaired skin integrity will decrease Outcome: Progressing   Problem: Safety: Goal: Non-violent Restraint(s) Outcome: Progressing   Problem: Education: Goal: Knowledge of disease or condition will improve Outcome: Progressing Goal: Knowledge of secondary prevention will improve (MUST DOCUMENT ALL) Outcome: Progressing Goal: Knowledge of patient specific risk factors will improve Loraine Leriche N/A or DELETE if not current risk factor) Outcome: Progressing   Problem: Ischemic Stroke/TIA Tissue Perfusion: Goal: Complications of ischemic stroke/TIA will be minimized Outcome: Progressing   Problem: Coping: Goal: Will verbalize positive feelings about self Outcome: Progressing Goal: Will identify appropriate support needs Outcome: Progressing   Problem: Self-Care: Goal: Ability to participate in self-care as condition permits will improve Outcome: Progressing Goal: Verbalization of feelings and concerns over difficulty with self-care will improve Outcome: Progressing Goal: Ability to communicate needs accurately will improve Outcome: Progressing   Problem: Nutrition: Goal: Risk of aspiration will decrease Outcome: Progressing Goal: Dietary intake will improve Outcome: Progressing

## 2023-01-07 NOTE — Evaluation (Signed)
Physical Therapy Evaluation Patient Details Name: Adam Arellano MRN: [id number] DOB: [birth year] Today's Date: 01/07/2023  History of Present Illness  Pt is 89 yo male presenting to [facility] with R gaze preference and not able to follow commands. En route via EMS pt had tonic-clonic seizure and received Versed. MRI was performed significant for chronic changes. PMH significant for CVA, PVD, Asthma, CAD, LPR, HTN, CKD III, Macular degeneration, mitral valve disease, transient global amnesia in 2009, hypertension, hyperlipidemia, coronary artery disease with NSTEMI status post multiple PCI's, prostate cancer status post radical prostatectomy, severe mitral regurgitation status post TEER ( 08/19/2020).  Clinical Impression  Pt is presenting below baseline level of functioning. Prior pt was SBA to Mod I with family; currently pt is Supervision to Mod I with functional activities requiring heavy multi modal cueing for safety to prevent falls. Pt has difficulty following directions currently and with sequencing of activities to decrease level of physical assist required. Due to pt current functional status, PLOF, home set up and available assistance at home recommending skilled physical therapy services at a higher frequency and level of care (5x/weekly) in order to decrease risk for falls, injury, immobility, skin break down and re-hospitalization. Pt was short of breathe with L shoulder pain during gait; RN was notified though pt was on telemetry and has history of RC tear. Ambulation was not progressed further due to shortness of breath, bil knees starting to buckle and pain in L shoulder; RN stated understanding.         If plan is discharge home, recommend the following: A little help with walking and/or transfers;Assistance with cooking/housework;Assist for transportation;Help with stairs or ramp for entrance   Can travel by private vehicle   Yes    Equipment Recommendations None recommended by PT   Recommendations for Other Services       Functional Status Assessment Patient has had a recent decline in their functional status and demonstrates the ability to make significant improvements in function in a reasonable and predictable amount of time.     Precautions / Restrictions Precautions Precautions: Fall Restrictions Weight Bearing Restrictions: No      Mobility  Bed Mobility Overal bed mobility: Needs Assistance Bed Mobility: Supine to Sit, Sit to Supine     Supine to sit: Supervision Sit to supine: Min assist   General bed mobility comments: Supervision with HOB slightly elevated and minimal use of bed rail. Pt was Min A with trunk for sitting to supine and with straightening out/scooting in bed. Patient Response: Impulsive, Restless  Transfers Overall transfer level: Needs assistance Equipment used: Rolling walker (2 wheels) Transfers: Sit to/from Stand Sit to Stand: Min assist           General transfer comment: Min A for stabilization on standing heavy cueing for AD placement, hand placement, sequencing and to prevent pt impulsively getting up from EOB.    Ambulation/Gait Ambulation/Gait assistance: Mod assist Gait Distance (Feet): 60 Feet Assistive device: Rolling walker (2 wheels) Gait Pattern/deviations: Step-through pattern, Decreased step length - right, Decreased step length - left, Trunk flexed, Staggering left, Shuffle Gait velocity: significant decrease in cadence. Gait velocity interpretation: <1.31 ft/sec, indicative of household ambulator   General Gait Details: Very short steps with anterior lean, heavy multi modal cueing for body habitus proximity to AD. Mod assist to maintain balance, manage AD  Stairs Stairs:  (unsafe at this time due to pt current functional status and ability to follow directions)  Tilt Bed Tilt Bed Patient Response: Impulsive, Restless  Modified Rankin (Stroke Patients Only)       Balance  Overall balance assessment: Needs assistance Sitting-balance support: No upper extremity supported, Feet unsupported Sitting balance-Leahy Scale: Fair Sitting balance - Comments: no overt LOB   Standing balance support: Bilateral upper extremity supported, During functional activity, Reliant on assistive device for balance Standing balance-Leahy Scale: Poor Standing balance comment: Mod A intermittently, heavy reliance on AD with anterior COM over BOS         Pertinent Vitals/Pain Pain Assessment Pain Assessment: No/denies pain    Home Living Family/patient expects to be discharged to:: Private residence Living Arrangements: Children Available Help at Discharge: Family;Available 24 hours/day Type of Home: House Home Access: Stairs to enter Entrance Stairs-Rails: Doctor, general practice of Steps: 6   Home Layout: One level Home Equipment: Agricultural consultant (2 wheels);Tub bench;Other (comment) Additional Comments: toilet with handrails over the toilet, bed rail    Prior Function Prior Level of Function : Needs assist  Cognitive Assist : ADLs (cognitive)   ADLs (Cognitive): Set up cues Physical Assist : Mobility (physical);ADLs (physical) Mobility (physical): Transfers;Gait;Stairs ADLs (physical): Feeding;Grooming;Bathing;Dressing;Toileting Mobility Comments: SBA for stairs and Mod I with RW for gait. ADLs Comments: SBA to set up for ADL's including eating, grooming, bathing, dressing including preparing meals and cleaning after BM     Hand Dominance   Dominant Hand: Right    Extremity/Trunk Assessment   Upper Extremity Assessment Upper Extremity Assessment: Defer to OT evaluation    Lower Extremity Assessment Lower Extremity Assessment: Generalized weakness    Cervical / Trunk Assessment Cervical / Trunk Assessment: Kyphotic  Communication   Communication: HOH  Cognition Arousal/Alertness: Awake/alert Behavior During Therapy: WFL for tasks  assessed/performed Overall Cognitive Status: History of cognitive impairments - at baseline     General Comments: Pt has diffculty following directions, hard to stay on task, impulsive with mobility               Assessment/Plan    PT Assessment Patient needs continued PT services  PT Problem List Decreased strength;Decreased mobility;Decreased safety awareness;Decreased activity tolerance;Decreased cognition;Decreased balance       PT Treatment Interventions DME instruction;Therapeutic activities;Gait training;Therapeutic exercise;Patient/family education;Stair training;Balance training;Functional mobility training;Neuromuscular re-education    PT Goals (Current goals can be found in the Care Plan section)  Acute Rehab PT Goals Patient Stated Goal: To get stronger to return home with family PT Goal Formulation: With patient Time For Goal Achievement: 01/21/23 Potential to Achieve Goals: Fair    Frequency Min 1X/week        AM-PAC PT "6 Clicks" Mobility  Outcome Measure Help needed turning from your back to your side while in a flat bed without using bedrails?: None Help needed moving from lying on your back to sitting on the side of a flat bed without using bedrails?: None Help needed moving to and from a bed to a chair (including a wheelchair)?: A Little Help needed standing up from a chair using your arms (e.g., wheelchair or bedside chair)?: A Little Help needed to walk in hospital room?: A Lot Help needed climbing 3-5 steps with a railing? : Total 6 Click Score: 17    End of Session Equipment Utilized During Treatment: Gait belt Activity Tolerance: Patient tolerated treatment well Patient left: in bed;with call bell/phone within reach;with bed alarm set;with restraints reapplied Nurse Communication: Mobility status PT Visit Diagnosis: Unsteadiness on feet (R26.81);Other abnormalities of gait and mobility (R26.89);Muscle weakness (generalized) (  M62.81)    Time:  9562-1308 PT Time Calculation (min) (ACUTE ONLY): 29 min   Charges:   PT Evaluation $PT Eval Low Complexity: 1 Low PT Treatments $Therapeutic Activity: 8-22 mins PT General Charges $$ ACUTE PT VISIT: 1 Visit       Harrel Carina, DPT, CLT  Acute Rehabilitation Services Office: [phone] (Secure chat preferred)   Claudia Desanctis 01/07/2023, 12:17 PM

## 2023-01-07 NOTE — Progress Notes (Signed)
PROGRESS NOTE    Adam Arellano  ZOX:[id number] DOB: 06-06-1931 DOA: 01/05/2023 PCP: Delma Officer, PA   Brief Narrative:  89 year old with history of CVA, anemia, PVD, asthma, CAD, LPR, HTN, CKD stage IIIb, macular degeneration, mitral valve regurgitation status post repair, prostate cancer status post prostatectomy comes to the hospital with right gaze preference and not able to follow commands.  And route patient had tonic-clonic seizure and received Versed.  Initially code stroke was called and patient was seen by neurology.  Initial CT head and CTA head and neck did not show any acute pathology.  MRI brain was negative.  Did show chronic changes.  Patient was given Keppra and Ativan.   Assessment & Plan:  Principal Problem:   Seizure (HCC) Active Problems:   Primary hypertension   Nonrheumatic mitral valve regurgitation   H/O mitral valve repair   PVD (peripheral vascular disease) (HCC)   Coronary artery disease   Advanced nonexudative age-related macular degeneration of right eye without subfoveal involvement   Advanced nonexudative age-related macular degeneration of left eye without subfoveal involvement   Anemia in chronic kidney disease   Stage 3b chronic kidney disease (HCC)   History of malignant neoplasm of prostate   Laryngopharyngeal reflux (LPR)   History of CVA (cerebrovascular accident)   Asthma, chronic   H/O radical prostatectomy   Mild cognitive impairment     Seizures CT head, MRI brain, CT head and neck are negative for acute pathology.  EEG shows chronic slowing.  Patient started on Depakote.   History of CVA Resume once able to tolerate p.o. meds  Hypokalemia - As needed repletion   Hypertension IV as needed   CAD PVD Hold home aspirin, losartan and metoprolol.  IV as needed   Anemia Stable hemoglobin at 10   CKD 3b Creatinine stable at 1.7.    Mild cognitive impairment Chronic   Mitral valve regurgitation s/p repair -Stable    History of prostate cancer Status post prostatectomy.  Follow-up outpatient   SNF placement.  Recommend palliative care team to follow at SNF   DVT prophylaxis: Lovenox Code Status: DNR Family Communication: Daughter present at bedside SNF placement       Diet Orders (From admission, onward)     Start     Ordered   01/07/23 0756  Diet 2 gram sodium Room service appropriate? Yes; Fluid consistency: Thin  Diet effective now       Question Answer Comment  Room service appropriate? Yes   Fluid consistency: Thin      01/07/23 0755            Subjective: Daughter present at bedside Patient is more awake this morning and carries on very basic conversation.  Daughter confirms that patient is very close to his baseline this morning.   Examination:  Constitutional: Not in acute distress Respiratory: Clear to auscultation bilaterally Cardiovascular: Normal sinus rhythm, no rubs Abdomen: Nontender nondistended good bowel sounds Musculoskeletal: No edema noted Skin: No rashes seen Neurologic: CN 2-12 grossly intact.  And nonfocal Psychiatric: Alert to name and place.  Poor judgment and insight  Objective: Vitals:   01/07/23 0405 01/07/23 0743 01/07/23 0812 01/07/23 0832  BP: (!) 170/97 (!) 173/100 (!) 201/90 (!) 155/83  Pulse: (!) 59 75    Resp:  18    Temp: 98.4 F (36.9 C) 97.8 F (36.6 C)    TempSrc: Axillary Oral    SpO2: 100% 100%    Weight:  Intake/Output Summary (Last 24 hours) at 01/07/2023 1046 Last data filed at 01/07/2023 0343 Gross per 24 hour  Intake 110 ml  Output --  Net 110 ml   Filed Weights   01/06/23 1400  Weight: 69.6 kg    Scheduled Meds:  aspirin EC  81 mg Oral Daily   enoxaparin (LOVENOX) injection  30 mg Subcutaneous Q24H   losartan  25 mg Oral Daily   metoprolol tartrate  25 mg Oral BID   sodium chloride flush  3 mL Intravenous Q12H   Continuous Infusions:  valproate sodium 500 mg (01/07/23 0530)    Nutritional  status     Body mass index is 23.34 kg/m.  Data Reviewed:   CBC: Recent Labs  Lab 01/05/23 1247 01/06/23 0253  WBC 11.8* 7.9  NEUTROABS 6.2  --   HGB 10.8* 9.8*  HCT 35.6* 31.3*  MCV 96.5 92.1  PLT 312 249   Basic Metabolic Panel: Recent Labs  Lab 01/05/23 1247 01/06/23 0253 01/07/23 0630  NA 136 138 138  K 3.7 3.4* 3.6  CL 107 106 103  CO2 17* 24 22  GLUCOSE 129* 111* 83  BUN 29* 22 22  CREATININE 1.75* 1.63* 1.79*  CALCIUM 8.4* 8.8* 8.7*   GFR: Estimated Creatinine Clearance: 27.1 mL/min (A) (by C-G formula based on SCr of 1.79 mg/dL (H)). Liver Function Tests: Recent Labs  Lab 01/05/23 1247 01/06/23 0253  AST 17 13*  ALT 11 9  ALKPHOS 81 79  BILITOT 0.2* 0.6  PROT 6.3* 5.8*  ALBUMIN 3.3* 3.0*   No results for input(s): "LIPASE", "AMYLASE" in the last 168 hours. No results for input(s): "AMMONIA" in the last 168 hours. Coagulation Profile: Recent Labs  Lab 01/05/23 1247  INR 1.1   Cardiac Enzymes: No results for input(s): "CKTOTAL", "CKMB", "CKMBINDEX", "TROPONINI" in the last 168 hours. BNP (last 3 results) No results for input(s): "PROBNP" in the last 8760 hours. HbA1C: No results for input(s): "HGBA1C" in the last 72 hours. CBG: Recent Labs  Lab 01/05/23 1247  GLUCAP 114*   Lipid Profile: No results for input(s): "CHOL", "HDL", "LDLCALC", "TRIG", "CHOLHDL", "LDLDIRECT" in the last 72 hours. Thyroid Function Tests: No results for input(s): "TSH", "T4TOTAL", "FREET4", "T3FREE", "THYROIDAB" in the last 72 hours. Anemia Panel: No results for input(s): "VITAMINB12", "FOLATE", "FERRITIN", "TIBC", "IRON", "RETICCTPCT" in the last 72 hours. Sepsis Labs: No results for input(s): "PROCALCITON", "LATICACIDVEN" in the last 168 hours.  No results found for this or any previous visit (from the past 240 hour(s)).       Radiology Studies: Overnight EEG with video  Result Date: 01/06/2023 Windell Norfolk, MD     01/06/2023 10:37 PM Patient Name:  Adam Arellano MRN: [id number] Epilepsy Attending: Windell Norfolk Referring Physician/Provider: Date: 01/06/2023 Start time: 0230 End Time: 1625 Duration:  13 hours 55 minutes Patient history: 89 year old man with history of stroke ,HTN, HLD, cognitive impairment, seizure. EEG to assess for seizures Level of alertness: Restless and  lethargic AEDs during EEG study: Levetiracetam Technical aspects: This EEG study was done with scalp electrodes positioned according to the 10-20 International system of electrode placement. Electrical activity was reviewed with band pass filter of 1-70Hz , sensitivity of 7 uV/mm, display speed of 46mm/sec with a 60Hz  notched filter applied as appropriate. EEG data were recorded continuously and digitally stored.  Video monitoring was available and reviewed as appropriate. Description: The background consists of delta-theta activity of moderate voltage (25-35 uV) seen predominantly in posterior head  regions. Drowsiness was characterized by attenuation of the posterior background rhythm. Sleep was not seen. EEG showed continuous generalized polymorphic sharply contoured 3 to 6 Hz theta-delta slowing. There was additional left posterior quadrant focal slowing.  Hyperventilation and photic stimulation were not performed.   ABNORMALITY - Continuous slow, generalized - Left posterior quadrant focal slowing IMPRESSION: This study is suggestive of mild to moderate diffuse encephalopathy, nonspecific etiology but likely related to sedation, toxic-metabolic etiology, anoxic/hypoxic brain injury. There is also evidence of cortical dysfunction arising from left parietoccipital region, nonspecific etiology, likely secondary to underlying structural abnormality. No seizures seen. Windell Norfolk   MR BRAIN WO CONTRAST  Result Date: 01/05/2023 CLINICAL DATA:  Code stroke EXAM: MRI HEAD WITHOUT CONTRAST TECHNIQUE: Multiplanar, multiecho pulse sequences of the brain and surrounding structures were  obtained without intravenous contrast. COMPARISON:  Same-day CT head, brain MRI 04/03/2022 FINDINGS: A truncated protocol was performed. Motion degraded DWI and FLAIR sequences were obtained. Brain: There is no large vessel territorial infarct. There is mildly elevated DWI signal in the left thalamus along the left lateral ventricle which may be artifactual or a small acute infarct. The axial FLAIR sequence is markedly motion degraded. There is parenchymal volume loss with prominence of the ventricular system and extra-axial CSF spaces. There is a remote left PCA infarct, better seen on same-day CT. There is no midline shift. Vascular: Not well assessed. Skull and upper cervical spine: Not well assessed. Sinuses/Orbits: The imaged paranasal sinuses are clear. The globes and orbits are not well assessed. Other: None. IMPRESSION: 1. Truncated study with markedly motion degraded DWI and FLAIR sequences obtained. 2. Possible small acute infarct versus artifact in the left thalamus. Electronically Signed   By: Lesia Hausen M.D.   On: 01/05/2023 14:07   CT ANGIO HEAD NECK W WO CM (CODE STROKE)  Result Date: 01/05/2023 CLINICAL DATA:  Right-sided weakness and aphasia EXAM: CT ANGIOGRAPHY HEAD AND NECK WITH AND WITHOUT CONTRAST TECHNIQUE: Multidetector CT imaging of the head and neck was performed using the standard protocol during bolus administration of intravenous contrast. Multiplanar CT image reconstructions and MIPs were obtained to evaluate the vascular anatomy. Carotid stenosis measurements (when applicable) are obtained utilizing NASCET criteria, using the distal internal carotid diameter as the denominator. RADIATION DOSE REDUCTION: This exam was performed according to the departmental dose-optimization program which includes automated exposure control, adjustment of the mA and/or kV according to patient size and/or use of iterative reconstruction technique. CONTRAST:  75mL OMNIPAQUE IOHEXOL 350 MG/ML SOLN  COMPARISON:  MRA/MR head 04/03/2022 FINDINGS: CTA NECK FINDINGS Aortic arch: There is calcified plaque in the imaged aortic arch. The origins of the major branch vessels are patent. The subclavian arteries are patent to the level imaged. Right carotid system: The right common carotid artery is patent. There is bulky plaque in the proximal internal carotid artery resulting in up to approximately 70-80% stenosis. The distal internal carotid artery is patent. The external carotid artery is patent. There is no evidence of dissection or aneurysm. Left carotid system: The left common carotid artery is patent with mixed plaque resulting in up to approximately 60% stenosis. There is mild plaque at the bifurcation without significant stenosis of the internal carotid artery. The external carotid artery is patent. There is no evidence of dissection or aneurysm. Vertebral arteries: The vertebral arteries are patent, without hemodynamically significant stenosis or occlusion there is no evidence of dissection or aneurysm. Skeleton: There is no acute osseous abnormality or suspicious osseous lesion. There is  no visible canal hematoma. There is advanced multilevel degenerative change in the cervical spine without evidence of high-grade spinal canal stenosis. Other neck: The soft tissues of the neck are unremarkable. Upper chest: There is biapical pleural scarring. Review of the MIP images confirms the above findings CTA HEAD FINDINGS Anterior circulation: There is calcified plaque in the intracranial ICAs without significant stenosis or occlusion. The bilateral MCAs are patent, without proximal stenosis or occlusion. The bilateral ACAS are patent, without proximal stenosis or occlusion. There is a 3 mm left MCA bifurcation aneurysm, unchanged. Posterior circulation: The bilateral V4 segments are patent. The basilar artery is patent. The major cerebellar arteries appear patent. The left PCA is diminutive beyond the P2 segment.  Otherwise, the PCAs are patent, without other proximal high-grade stenosis or occlusion. Posterior communicating arteries are not identified. There is no aneurysm or AVM. Venous sinuses: Patent. Anatomic variants: None. Review of the MIP images confirms the above findings IMPRESSION: 1. No emergent large vessel occlusion. 2. Calcified plaque in the proximal right internal carotid artery resulting in approximately 70-80% stenosis. 3. Mixed plaque in the left common carotid artery resulting in approximately 60% stenosis. 4. Diminutive left PCA beyond the P2 segment. Otherwise, patent intracranial vasculature without other proximal high-grade stenosis or occlusion. 5. 3 mm left MCA bifurcation aneurysm, unchanged. Electronically Signed   By: Lesia Hausen M.D.   On: 01/05/2023 13:43   CT HEAD CODE STROKE WO CONTRAST  Result Date: 01/05/2023 CLINICAL DATA:  Code stroke.  Neuro deficit, acute, stroke suspected EXAM: CT HEAD WITHOUT CONTRAST TECHNIQUE: Contiguous axial images were obtained from the base of the skull through the vertex without intravenous contrast. RADIATION DOSE REDUCTION: This exam was performed according to the departmental dose-optimization program which includes automated exposure control, adjustment of the mA and/or kV according to patient size and/or use of iterative reconstruction technique. COMPARISON:  CT head October 29, 23. FINDINGS: Motion limited. Brain: Remote left PCA territory infarct. Remote left posterior limb of the internal capsule infarct. No evidence of acute large vascular territory infarct, acute hemorrhage, mass lesion or midline shift. Cerebral atrophy with ex vacuo ventricular dilation. Vascular: No hyperdense vessel. Skull: No acute fracture. Sinuses/Orbits: Clear sinuses.  No acute orbital findings. Other: No mastoid effusions. ASPECTS [facility] Stroke Program Early CT Score) Total score (0-10 with 10 being normal): 10. IMPRESSION: 1. No evidence of acute large vascular  territory infarct or acute hemorrhage. ASPECTS is 10. 2. Remote left PCA infarcts and chronic microvascular ischemic change. Code stroke imaging results were communicated on 01/05/2023 at 1:22 pm to provider Charm Barges via telephone, who verbally acknowledged these results. Electronically Signed   By: Feliberto Harts M.D.   On: 01/05/2023 13:24           LOS: 1 day   Time spent= 35 mins     Joline Maxcy, MD [facility]  If 7PM-7AM, please contact night-coverage  01/07/2023, 10:46 AM

## 2023-01-08 ENCOUNTER — Other Ambulatory Visit: Payer: Self-pay

## 2023-01-08 DIAGNOSIS — R569 Unspecified convulsions: Secondary | ICD-10-CM | POA: Diagnosis not present

## 2023-01-08 MED ORDER — AMLODIPINE BESYLATE 5 MG PO TABS: 5.0000 mg | ORAL_TABLET | Freq: Every day | ORAL | Status: AC

## 2023-01-08 MED ORDER — AMLODIPINE BESYLATE 5 MG PO TABS
5.0000 mg | ORAL_TABLET | Freq: Every day | ORAL | Status: DC
  Administered 2023-01-08 – 2023-01-10 (×3): 5 mg via ORAL
  Filled 2023-01-08 (×3): qty 1

## 2023-01-08 MED ORDER — ORAL CARE MOUTH RINSE: 15.0000 mL | OROMUCOSAL | Status: DC | PRN

## 2023-01-08 MED ORDER — DIVALPROEX SODIUM 250 MG PO DR TAB: 750.0000 mg | DELAYED_RELEASE_TABLET | Freq: Two times a day (BID) | ORAL | 0 refills | Status: AC

## 2023-01-08 NOTE — [place] FL2 (Signed)
[place] MEDICAID FL2 LEVEL OF CARE FORM     IDENTIFICATION  Patient Name: Adam Arellano Birthdate: Nov 23, 1931 Sex: male Admission Date (Current Location): 01/05/2023  [facility] and [place] Number:  Producer, television/film/video and Address:  [facility] [place], [address] [place], [place] [address]      Provider Number: [id number]  Attending Physician Name and Address:  Dimple Nanas, MD  Relative Name and Phone Number:  Robynn Pane [phone]    Current Level of Care: Hospital Recommended Level of Care: Skilled Nursing Facility Prior Approval Number:    Date Approved/Denied:   PASRR Number: [id number]  Discharge Plan: SNF    Current Diagnoses: Patient Active Problem List   Diagnosis Date Noted   Seizure (HCC) 01/05/2023   Mild cognitive impairment 11/13/2022   Transient global amnesia 07/02/2022   Asthma, chronic 07/02/2022   H/O radical prostatectomy 07/02/2022   History of CVA (cerebrovascular accident) 04/02/2022   Hypokalemia 04/02/2022   Paresthesia 01/16/2022   Advanced nonexudative age-related macular degeneration of right eye without subfoveal involvement 11/14/2021   Advanced nonexudative age-related macular degeneration of left eye without subfoveal involvement 11/14/2021   H/O mitral valve repair 08/19/2020   PVC's (premature ventricular contractions)    PVD (peripheral vascular disease) (HCC)    Coronary artery disease    Nonrheumatic mitral valve regurgitation    Anemia in chronic kidney disease 04/24/2020   Stage 3b chronic kidney disease (HCC) 04/24/2020   History of malignant neoplasm of prostate 04/24/2020   Hypertensive renal disease 04/24/2020   Otorrhea of left ear 01/12/2020   Tympanic membrane perforation, left 01/12/2020   Acute non-recurrent sinusitis 08/07/2018   Tympanic membrane perforation, marginal, right 08/07/2018   Cholesteatoma of attic of ear, left 01/23/2017   Conductive hearing loss of both ears  01/23/2017   Laryngopharyngeal reflux (LPR) 01/23/2017   Perennial allergic rhinitis 01/23/2017   Sensorineural hearing loss (SNHL), bilateral 08/17/2015   Bilateral chronic secretory otitis media 08/09/2015   Chronic swimmer's ear of both sides 08/09/2015   History of colonic polyps 08/09/2015   Dysfunction of both eustachian tubes 07/19/2015   Primary hypertension 02/27/2014   Hyperlipidemia 06/10/2013    Orientation RESPIRATION BLADDER Height & Weight     Self, Situation, Place  Normal Incontinent Weight: 153 lb 8 oz (69.6 kg) (recorded on 11/13/22) Height:     BEHAVIORAL SYMPTOMS/MOOD NEUROLOGICAL BOWEL NUTRITION STATUS      Continent Diet (see dc summary)  AMBULATORY STATUS COMMUNICATION OF NEEDS Skin   Extensive Assist Verbally Normal                       Personal Care Assistance Level of Assistance  Bathing, Feeding, Dressing Bathing Assistance: Limited assistance Feeding assistance: Limited assistance Dressing Assistance: Limited assistance     Functional Limitations Info  Sight, Hearing, Speech Sight Info: Adequate Hearing Info: Adequate Speech Info: Adequate    SPECIAL CARE FACTORS FREQUENCY  PT (By licensed PT), OT (By licensed OT)     PT Frequency: 5x week OT Frequency: 5x week            Contractures      Additional Factors Info  Code Status, Allergies Code Status Info: DNR Allergies Info: Simvastatin  Atorvastatin  Lisinopril  Rosuvastatin           Current Medications (01/08/2023):  This is the current hospital active medication list Current Facility-Administered Medications  Medication Dose Route Frequency Provider Last  Rate Last Admin   acetaminophen (TYLENOL) tablet 650 mg  650 mg Oral Q6H PRN Synetta Fail, MD   650 mg at 01/07/23 1107   Or   acetaminophen (TYLENOL) suppository 650 mg  650 mg Rectal Q6H PRN Synetta Fail, MD       amLODipine (NORVASC) tablet 5 mg  5 mg Oral Daily Amin, Loura Halt, MD   5 mg at  01/08/23 0956   aspirin EC tablet 81 mg  81 mg Oral Daily Rejeana Brock, MD   81 mg at 01/08/23 0956   divalproex (DEPAKOTE) DR tablet 750 mg  750 mg Oral Q12H Rejeana Brock, MD   750 mg at 01/08/23 0956   enoxaparin (LOVENOX) injection 30 mg  30 mg Subcutaneous Q24H Tamera Reason, RPH   30 mg at 01/07/23 1718   guaiFENesin (ROBITUSSIN) 100 MG/5ML liquid 5 mL  5 mL Oral Q4H PRN Amin, Ankit Chirag, MD       hydrALAZINE (APRESOLINE) injection 10 mg  10 mg Intravenous Q4H PRN Amin, Loura Halt, MD   10 mg at 01/08/23 5784   ipratropium-albuterol (DUONEB) 0.5-2.5 (3) MG/3ML nebulizer solution 3 mL  3 mL Nebulization Q4H PRN Amin, Ankit Chirag, MD       LORazepam (ATIVAN) injection 2 mg  2 mg Intravenous Q5 Min x 2 PRN Synetta Fail, MD       losartan (COZAAR) tablet 25 mg  25 mg Oral Daily Amin, Ankit Chirag, MD   25 mg at 01/08/23 0956   metoprolol tartrate (LOPRESSOR) injection 5 mg  5 mg Intravenous Q4H PRN Amin, Ankit Chirag, MD       metoprolol tartrate (LOPRESSOR) tablet 25 mg  25 mg Oral BID Amin, Ankit Chirag, MD   25 mg at 01/08/23 0956   ondansetron (ZOFRAN) injection 4 mg  4 mg Intravenous Q6H PRN Amin, Ankit Chirag, MD       polyethylene glycol (MIRALAX / GLYCOLAX) packet 17 g  17 g Oral Daily PRN Synetta Fail, MD       senna-docusate (Senokot-S) tablet 1 tablet  1 tablet Oral QHS PRN Amin, Ankit Chirag, MD       sodium chloride flush (NS) 0.9 % injection 3 mL  3 mL Intravenous Q12H Synetta Fail, MD   3 mL at 01/08/23 1000   traZODone (DESYREL) tablet 50 mg  50 mg Oral QHS PRN Dimple Nanas, MD   50 mg at 01/07/23 2213     Discharge Medications: Please see discharge summary for a list of discharge medications.  Relevant Imaging Results:  Relevant Lab Results:   Additional Information     B , LCSWA

## 2023-01-08 NOTE — Progress Notes (Signed)
PROGRESS NOTE    Adam Arellano  YQM:[id number] DOB: 1932-04-30 DOA: 01/05/2023 PCP: Delma Officer, PA   Brief Narrative:  89 year old with history of CVA, anemia, PVD, asthma, CAD, LPR, HTN, CKD stage IIIb, macular degeneration, mitral valve regurgitation status post repair, prostate cancer status post prostatectomy comes to the hospital with right gaze preference and not able to follow commands.  And route patient had tonic-clonic seizure and received Versed.  Initially code stroke was called and patient was seen by neurology.  Initial CT head and CTA head and neck did not show any acute pathology.  MRI brain was negative.  Did show chronic changes.  Patient was given Keppra and Ativan.  Eventually transition to Depakote which patient is tolerating well.  PT/OT recommended SNF.   Assessment & Plan:  Principal Problem:   Seizure (HCC) Active Problems:   Primary hypertension   Nonrheumatic mitral valve regurgitation   H/O mitral valve repair   PVD (peripheral vascular disease) (HCC)   Coronary artery disease   Advanced nonexudative age-related macular degeneration of right eye without subfoveal involvement   Advanced nonexudative age-related macular degeneration of left eye without subfoveal involvement   Anemia in chronic kidney disease   Stage 3b chronic kidney disease (HCC)   History of malignant neoplasm of prostate   Laryngopharyngeal reflux (LPR)   History of CVA (cerebrovascular accident)   Asthma, chronic   H/O radical prostatectomy   Mild cognitive impairment     Seizures CT head, MRI brain, CT head and neck are negative for acute pathology.  EEG shows chronic slowing.  Now on precode, tolerating it well   History of CVA Resume once able to tolerate p.o. meds  Hypokalemia - As needed repletion   Hypertension IV as needed   CAD PVD Hold home aspirin, losartan and metoprolol.  IV as needed   Anemia Stable hemoglobin at 10   CKD 3b Creatinine stable at  1.7.    Mild cognitive impairment Chronic   Mitral valve regurgitation s/p repair -Stable   History of prostate cancer Status post prostatectomy.  Follow-up outpatient   SNF placement.  Recommend palliative care team to follow at SNF   DVT prophylaxis: Lovenox Code Status: DNR Family Communication: Daughter present at bedside SNF placement       Diet Orders (From admission, onward)     Start     Ordered   01/07/23 0756  Diet 2 gram sodium Room service appropriate? Yes; Fluid consistency: Thin  Diet effective now       Question Answer Comment  Room service appropriate? Yes   Fluid consistency: Thin      01/07/23 0755            Subjective: Daughter at bedside.  Patient feeling much better.  No complaints Examination:  Constitutional: Not in acute distress Respiratory: Clear to auscultation bilaterally Cardiovascular: Normal sinus rhythm, no rubs Abdomen: Nontender nondistended good bowel sounds Musculoskeletal: No edema noted Skin: No rashes seen Neurologic: CN 2-12 grossly intact.  And nonfocal Psychiatric: Normal judgment and insight. Alert and oriented x 3. Normal mood.  Objective: Vitals:   01/07/23 2024 01/07/23 2345 01/08/23 0407 01/08/23 0845  BP: (!) 149/102 (!) 160/85 (!) 184/117 114/65  Pulse: 76 71 78 84  Resp: 18 18 18 19   Temp: 98 F (36.7 C) 98.2 F (36.8 C) 97.7 F (36.5 C) 97.7 F (36.5 C)  TempSrc: Axillary Axillary Axillary Oral  SpO2: 97% 98% 100% 99%  Weight:  No intake or output data in the 24 hours ending 01/08/23 1055  Filed Weights   01/06/23 1400  Weight: 69.6 kg    Scheduled Meds:  amLODipine  5 mg Oral Daily   aspirin EC  81 mg Oral Daily   divalproex  750 mg Oral Q12H   enoxaparin (LOVENOX) injection  30 mg Subcutaneous Q24H   losartan  25 mg Oral Daily   metoprolol tartrate  25 mg Oral BID   sodium chloride flush  3 mL Intravenous Q12H   Continuous Infusions:    Nutritional status     Body mass  index is 23.34 kg/m.  Data Reviewed:   CBC: Recent Labs  Lab 01/05/23 1247 01/06/23 0253 01/08/23 0731  WBC 11.8* 7.9 9.6  NEUTROABS 6.2  --   --   HGB 10.8* 9.8* 12.2*  HCT 35.6* 31.3* 38.1*  MCV 96.5 92.1 89.4  PLT 312 249 330   Basic Metabolic Panel: Recent Labs  Lab 01/05/23 1247 01/06/23 0253 01/07/23 0630 01/08/23 0731  NA 136 138 138 136  K 3.7 3.4* 3.6 4.2  CL 107 106 103 104  CO2 17* 24 22 21*  GLUCOSE 129* 111* 83 80  BUN 29* 22 22 26*  CREATININE 1.75* 1.63* 1.79* 1.81*  CALCIUM 8.4* 8.8* 8.7* 8.7*  MG  --   --   --  2.1   GFR: Estimated Creatinine Clearance: 26.8 mL/min (A) (by C-G formula based on SCr of 1.81 mg/dL (H)). Liver Function Tests: Recent Labs  Lab 01/05/23 1247 01/06/23 0253  AST 17 13*  ALT 11 9  ALKPHOS 81 79  BILITOT 0.2* 0.6  PROT 6.3* 5.8*  ALBUMIN 3.3* 3.0*   No results for input(s): "LIPASE", "AMYLASE" in the last 168 hours. No results for input(s): "AMMONIA" in the last 168 hours. Coagulation Profile: Recent Labs  Lab 01/05/23 1247  INR 1.1   Cardiac Enzymes: No results for input(s): "CKTOTAL", "CKMB", "CKMBINDEX", "TROPONINI" in the last 168 hours. BNP (last 3 results) No results for input(s): "PROBNP" in the last 8760 hours. HbA1C: No results for input(s): "HGBA1C" in the last 72 hours. CBG: Recent Labs  Lab 01/05/23 1247 01/05/23 1507  GLUCAP 114* 114*   Lipid Profile: No results for input(s): "CHOL", "HDL", "LDLCALC", "TRIG", "CHOLHDL", "LDLDIRECT" in the last 72 hours. Thyroid Function Tests: No results for input(s): "TSH", "T4TOTAL", "FREET4", "T3FREE", "THYROIDAB" in the last 72 hours. Anemia Panel: No results for input(s): "VITAMINB12", "FOLATE", "FERRITIN", "TIBC", "IRON", "RETICCTPCT" in the last 72 hours. Sepsis Labs: No results for input(s): "PROCALCITON", "LATICACIDVEN" in the last 168 hours.  No results found for this or any previous visit (from the past 240 hour(s)).       Radiology  Studies: Overnight EEG with video  Result Date: 01/06/2023 Windell Norfolk, MD     01/06/2023 10:37 PM Patient Name: Adam Arellano MRN: [id number] Epilepsy Attending: Windell Norfolk Referring Physician/Provider: Date: 01/06/2023 Start time: 0230 End Time: 1625 Duration:  13 hours 55 minutes Patient history: 89 year old man with history of stroke ,HTN, HLD, cognitive impairment, seizure. EEG to assess for seizures Level of alertness: Restless and  lethargic AEDs during EEG study: Levetiracetam Technical aspects: This EEG study was done with scalp electrodes positioned according to the 10-20 International system of electrode placement. Electrical activity was reviewed with band pass filter of 1-70Hz , sensitivity of 7 uV/mm, display speed of 39mm/sec with a 60Hz  notched filter applied as appropriate. EEG data were recorded continuously and digitally stored.  Video monitoring was available and reviewed as appropriate. Description: The background consists of delta-theta activity of moderate voltage (25-35 uV) seen predominantly in posterior head regions. Drowsiness was characterized by attenuation of the posterior background rhythm. Sleep was not seen. EEG showed continuous generalized polymorphic sharply contoured 3 to 6 Hz theta-delta slowing. There was additional left posterior quadrant focal slowing.  Hyperventilation and photic stimulation were not performed.   ABNORMALITY - Continuous slow, generalized - Left posterior quadrant focal slowing IMPRESSION: This study is suggestive of mild to moderate diffuse encephalopathy, nonspecific etiology but likely related to sedation, toxic-metabolic etiology, anoxic/hypoxic brain injury. There is also evidence of cortical dysfunction arising from left parietoccipital region, nonspecific etiology, likely secondary to underlying structural abnormality. No seizures seen. Amadou Camara           LOS: 2 days   Time spent= 35 mins     Joline Maxcy, MD [facility]  If 7PM-7AM, please contact night-coverage  01/08/2023, 10:55 AM

## 2023-01-08 NOTE — TOC Initial Note (Signed)
Transition of Care [facility]) - Initial/Assessment Note    Patient Details  Name: Adam Arellano MRN: [id number] Date of Birth: 1932/05/15  Transition of [facility] CM/SW Contact:    Carmina Miller, LCSWA Phone Number: 01/08/2023, 12:35 PM  Clinical Narrative:                  CSW spoke with pt's daughter [name] concerning SNF recommendation, she is in agreement, reports preference for [facility] but agreeable to [facility]. Insurance auth and [employer] discussed, no further questions.        Patient Goals and CMS Choice            Expected Discharge Plan and Services                                              Prior Living Arrangements/Services                       Activities of Daily Living      Permission Sought/Granted                  Emotional Assessment              Admission diagnosis:  Seizure (HCC) [R56.9] Altered mental status, unspecified altered mental status type [R41.82] Patient Active Problem List   Diagnosis Date Noted   Seizure (HCC) 01/05/2023   Mild cognitive impairment 11/13/2022   Transient global amnesia 07/02/2022   Asthma, chronic 07/02/2022   H/O radical prostatectomy 07/02/2022   History of CVA (cerebrovascular accident) 04/02/2022   Hypokalemia 04/02/2022   Paresthesia 01/16/2022   Advanced nonexudative age-related macular degeneration of right eye without subfoveal involvement 11/14/2021   Advanced nonexudative age-related macular degeneration of left eye without subfoveal involvement 11/14/2021   H/O mitral valve repair 08/19/2020   PVC's (premature ventricular contractions)    PVD (peripheral vascular disease) (HCC)    Coronary artery disease    Nonrheumatic mitral valve regurgitation    Anemia in chronic kidney disease 04/24/2020   Stage 3b chronic kidney disease (HCC) 04/24/2020   History of malignant neoplasm of prostate 04/24/2020   Hypertensive renal disease 04/24/2020    Otorrhea of left ear 01/12/2020   Tympanic membrane perforation, left 01/12/2020   Acute non-recurrent sinusitis 08/07/2018   Tympanic membrane perforation, marginal, right 08/07/2018   Cholesteatoma of attic of ear, left 01/23/2017   Conductive hearing loss of both ears 01/23/2017   Laryngopharyngeal reflux (LPR) 01/23/2017   Perennial allergic rhinitis 01/23/2017   Sensorineural hearing loss (SNHL), bilateral 08/17/2015   Bilateral chronic secretory otitis media 08/09/2015   Chronic swimmer's ear of both sides 08/09/2015   History of colonic polyps 08/09/2015   Dysfunction of both eustachian tubes 07/19/2015   Primary hypertension 02/27/2014   Hyperlipidemia 06/10/2013   PCP:  Delma Officer, PA Pharmacy:   [id number] [place], [place] - [address] [place] [address] Phone: [phone] Fax: [phone]     Social Determinants of Health (SDOH) Social History: SDOH Screenings   Food Insecurity: No Food Insecurity (07/02/2022)  Housing: Low Risk  (07/02/2022)  Transportation Needs: No Transportation Needs (07/02/2022)  Utilities: Not At Risk (07/02/2022)  Depression (PHQ2-9): Low Risk  (06/30/2022)  Recent Concern: Depression (PHQ2-9) - Medium Risk (05/19/2022)  Social  Connections: Unknown (10/18/2021)   Received from [facility]  Tobacco Use: Medium Risk (01/05/2023)   SDOH Interventions:     Readmission Risk Interventions    07/04/2022    1:34 PM  Readmission Risk Prevention Plan  Transportation Screening Complete  HRI or Home Care Consult Complete  SW Recovery Care/Counseling Consult Complete  Palliative Care Screening Not Applicable  Skilled Nursing Facility Complete

## 2023-01-08 NOTE — Evaluation (Signed)
Occupational Therapy Evaluation Patient Details Name: Adam Arellano MRN: [id number] DOB: 11-11-1931 Today's Date: 01/08/2023   History of Present Illness Pt is 89 yo male presenting to [facility] with R gaze preference and not able to follow commands. En route via EMS pt had tonic-clonic seizure and received Versed. MRI was performed significant for chronic changes. PMH significant for CVA, PVD, Asthma, CAD, LPR, HTN, CKD III, Macular degeneration, mitral valve disease, transient global amnesia in 2009, hypertension, hyperlipidemia, coronary artery disease with NSTEMI status post multiple PCI's, prostate cancer status post radical prostatectomy, severe mitral regurgitation status post TEER ( 08/19/2020).   Clinical Impression   PTA, pt lives with family, typically Supervision for ADLs/mobility using RW due to cognition. Pt presenting with deficits in standing balance, safety awareness, strength and endurance. Pt impulsive with movement, requires consistent hands on assist and cues for ADLs/mobility. Pt require Min A for mobility using RW, Min A for UB ADL and Mod A for LB ADLs. Unless family able to provide the needed physical assist at home, recommend < 3 hours of postacute therapy follow up per day prior to return home. Will continue to follow acutely.     Recommendations for follow up therapy are one component of a multi-disciplinary discharge planning process, led by the attending physician.  Recommendations may be updated based on patient status, additional functional criteria and insurance authorization.   Assistance Recommended at Discharge Frequent or constant Supervision/Assistance  Patient can return home with the following A little help with walking and/or transfers;A lot of help with bathing/dressing/bathroom    Functional Status Assessment  Patient has had a recent decline in their functional status and demonstrates the ability to make significant improvements in function in a reasonable  and predictable amount of time.  Equipment Recommendations  None recommended by OT    Recommendations for Other Services       Precautions / Restrictions Precautions Precautions: Fall Restrictions Weight Bearing Restrictions: No      Mobility Bed Mobility Overal bed mobility: Needs Assistance Bed Mobility: Sit to Supine, Supine to Sit     Supine to sit: HOB elevated, Min guard Sit to supine: Min assist   General bed mobility comments: assist for bLE back to bed    Transfers Overall transfer level: Needs assistance Equipment used: Rolling walker (2 wheels) Transfers: Sit to/from Stand Sit to Stand: Min guard                  Balance Overall balance assessment: Needs assistance Sitting-balance support: No upper extremity supported, Feet unsupported Sitting balance-Leahy Scale: Fair     Standing balance support: Bilateral upper extremity supported, During functional activity, Reliant on assistive device for balance Standing balance-Leahy Scale: Fair                             ADL either performed or assessed with clinical judgement   ADL Overall ADL's : Needs assistance/impaired Eating/Feeding: Set up;Sitting   Grooming: Minimal assistance;Standing;Wash/dry hands Grooming Details (indicate cue type and reason): assist to gather paper towels, cues for attention to task, min guard for balance/Min A for manuevering RW at sink appropriately Upper Body Bathing: Minimal assistance;Sitting   Lower Body Bathing: Moderate assistance;Sit to/from stand   Upper Body Dressing : Minimal assistance;Sitting   Lower Body Dressing: Moderate assistance;Sit to/from stand   Toilet Transfer: Minimal assistance;Ambulation;Rolling walker (2 wheels) Toilet Transfer Details (indicate cue type and reason): significant assist for RW  mgmt as pt pushing it too far out of the way, to the side and bumping obstacles. walked RW over toilet for urination task Toileting-  Clothing Manipulation and Hygiene: Minimal assistance;Sit to/from stand;Sitting/lateral lean Toileting - Clothing Manipulation Details (indicate cue type and reason): Min A for clothing mgmt standing at sink for urination; primofit still intact though to maximize continence, allowed pt to stand over toilet w/ primofit tubing and urinate; fair balance with task     Functional mobility during ADLs: Minimal assistance;Rolling walker (2 wheels);Cueing for sequencing;Cueing for safety       Vision Baseline Vision/History: 1 Wears glasses Ability to See in Adequate Light: 0 Adequate Patient Visual Report: No change from baseline Vision Assessment?: No apparent visual deficits     Perception     Praxis      Pertinent Vitals/Pain Pain Assessment Pain Assessment: No/denies pain     Hand Dominance Right   Extremity/Trunk Assessment Upper Extremity Assessment Upper Extremity Assessment: Generalized weakness   Lower Extremity Assessment Lower Extremity Assessment: Defer to PT evaluation   Cervical / Trunk Assessment Cervical / Trunk Assessment: Normal   Communication Communication Communication: HOH   Cognition Arousal/Alertness: Awake/alert Behavior During Therapy: Impulsive, Flat affect Overall Cognitive Status: History of cognitive impairments - at baseline                                 General Comments: impulsive with mobility, requiring safety cues throughout for DME use, fall prevention and appropriate sequencing of ADLs     General Comments       Exercises     Shoulder Instructions      Home Living Family/patient expects to be discharged to:: Private residence Living Arrangements: Children Available Help at Discharge: Family;Available 24 hours/day Type of Home: House Home Access: Stairs to enter [employer] of Steps: 6 Entrance Stairs-Rails: Right;Left Home Layout: One level     Bathroom Shower/Tub: Contractor: Standard Bathroom Accessibility: Yes   Home Equipment: Agricultural consultant (2 wheels);Tub bench;Other (comment);Toilet riser (bedrail)          Prior Functioning/Environment Prior Level of Function : Needs assist  Cognitive Assist : ADLs (cognitive)     Physical Assist : Mobility (physical);ADLs (physical) Mobility (physical): Transfers;Gait;Stairs ADLs (physical): Feeding;Grooming;Bathing;Dressing;Toileting Mobility Comments: SBA for stairs and Mod I with RW for gait. ADLs Comments: SBA to set up for ADL's including eating, grooming, bathing, dressing including preparing meals and cleaning after BM        OT Problem List: Decreased strength;Decreased activity tolerance;Impaired balance (sitting and/or standing);Decreased cognition;Decreased safety awareness;Decreased knowledge of use of DME or AE      OT Treatment/Interventions: Self-care/ADL training;Therapeutic exercise;Energy conservation;DME and/or AE instruction;Therapeutic activities;Patient/family education;Balance training    OT Goals(Current goals can be found in the care plan section) Acute Rehab OT Goals Patient Stated Goal: go home soon OT Goal Formulation: With patient Time For Goal Achievement: 01/22/23 Potential to Achieve Goals: Fair  OT Frequency: Min 2X/week    Co-evaluation              AM-PAC OT "6 Clicks" Daily Activity     Outcome Measure Help from another person eating meals?: A Little Help from another person taking care of personal grooming?: A Little Help from another person toileting, which includes using toliet, bedpan, or urinal?: A Little Help from another person bathing (including washing, rinsing, drying)?: A Lot Help from  another person to put on and taking off regular upper body clothing?: A Little Help from another person to put on and taking off regular lower body clothing?: A Lot 6 Click Score: 16   End of Session Equipment Utilized During Treatment: Rolling walker (2  wheels) Nurse Communication: Mobility status  Activity Tolerance: Patient tolerated treatment well Patient left: in bed;with call bell/phone within reach;with bed alarm set;Other (comment) (floor mat in place)  OT Visit Diagnosis: Unsteadiness on feet (R26.81);Other abnormalities of gait and mobility (R26.89);Muscle weakness (generalized) (M62.81)                Time: 2595-6387 OT Time Calculation (min): 19 min Charges:  OT General Charges $OT Visit: 1 Visit OT Evaluation $OT Eval Moderate Complexity: 1 Mod  Bradd Canary, OTR/L Acute Rehab Services Office: [phone]   Lorre Munroe 01/08/2023, 8:50 AM

## 2023-01-09 DIAGNOSIS — R569 Unspecified convulsions: Secondary | ICD-10-CM | POA: Diagnosis not present

## 2023-01-09 LAB — CREATININE, SERUM
Creatinine, Ser: 2.04 mg/dL — ABNORMAL HIGH (ref 0.61–1.24)
GFR, Estimated: 31 mL/min — ABNORMAL LOW (ref >60)

## 2023-01-09 MED ORDER — SODIUM CHLORIDE 0.9 % IV BOLUS
500.0000 mL | Freq: Once | INTRAVENOUS | Status: AC
  Administered 2023-01-09: 500 mL via INTRAVENOUS

## 2023-01-09 MED ORDER — SODIUM CHLORIDE 0.9 % IV SOLN: INTRAVENOUS | Status: AC

## 2023-01-09 NOTE — Discharge Summary (Signed)
Physician Discharge Summary  Adam Arellano:[id number] DOB: [birth year] DOA: 01/05/2023  PCP: Adam Officer, PA  Admit date: 01/05/2023 Discharge date: 01/10/2023  Admitted From: Home Disposition:  SNF  Recommendations for Outpatient Follow-up:  Follow up with PCP in 1-2 weeks Please obtain BMP/CBC in next 3-5 days Started Depakote 750 mg every 12 hours Would recommend outpatient follow-up with palliative care/referral   Discharge Condition: Stable CODE STATUS: DNR Diet recommendation: Heart healthy  Brief/Interim Summary: Patient is a 89 year old with history of CVA, anemia, PVD, asthma, CAD, LPR, HTN, CKD stage IIIb, macular degeneration, mitral valve regurgitation status post repair, prostate cancer status post prostatectomy presented to the hospital with right gaze preference and not able to follow commands.On  route ,patient had tonic-clonic seizure and received Versed. Initially code stroke was called and patient was seen by neurology. Initial CT head and CTA head and neck did not show any acute pathology. MRI brain was negative. Did show chronic changes. Patient was given Keppra and Ativan. Eventually transitioned to Depakote which patient is tolerating well. PT/OT recommended SNF.  Medically stable for discharge whenever possible.     Assessment & Plan:  Principal Problem:   Seizure (HCC) Active Problems:   Primary hypertension   Nonrheumatic mitral valve regurgitation   H/O mitral valve repair   PVD (peripheral vascular disease) (HCC)   Coronary artery disease   Advanced nonexudative age-related macular degeneration of right eye without subfoveal involvement   Advanced nonexudative age-related macular degeneration of left eye without subfoveal involvement   Anemia in chronic kidney disease   Stage 3b chronic kidney disease (HCC)   History of malignant neoplasm of prostate   Laryngopharyngeal reflux (LPR)   History of CVA (cerebrovascular accident)   Asthma,  chronic   H/O radical prostatectomy   Mild cognitive impairment      Seizures CT head, MRI brain, CT head and neck are negative for acute pathology.  EEG showed chronic slowing.  Currently doing well on Depakote.  Follow-up with neurology as an outpatient   History of CVA On daily aspirin   Hypokalemia Supplemented and corrected   Hypertension Continue amlodipine, losartan   CAD PVD On baby aspirin   Anemia Currently hemoglobin stable   AKI on CKD 3b Kidney function close to baseline.  Takes losartan and Lasix at home.  Check BMP in the next 3 to 5 days   Mild cognitive impairment Chronic   Mitral valve regurgitation s/p repair -Stable.  Follow-up with cardiology as an outpatient   History of prostate cancer Status post prostatectomy.  Follow-up outpatient urology   Discharge Diagnoses:  Principal Problem:   Seizure [facility]) Active Problems:   Primary hypertension   Nonrheumatic mitral valve regurgitation   H/O mitral valve repair   PVD (peripheral vascular disease) (HCC)   Coronary artery disease   Advanced nonexudative age-related macular degeneration of right eye without subfoveal involvement   Advanced nonexudative age-related macular degeneration of left eye without subfoveal involvement   Anemia in chronic kidney disease   Stage 3b chronic kidney disease (HCC)   History of malignant neoplasm of prostate   Laryngopharyngeal reflux (LPR)   History of CVA (cerebrovascular accident)   Asthma, chronic   H/O radical prostatectomy   Mild cognitive impairment      Consultations: Neuro  Subjective: Patient seen and examined the bedside today.  Hemodynamically stable.  Lying in bed.  Alert and awake .  Appears comfortable.  Denies any complaints today.  Medically stable for  discharge to SNF whenever possible   Discharge Exam: Vitals:   01/10/23 0739 01/10/23 1135  BP: (!) 156/79 (!) 163/95  Pulse: 72 77  Resp: 18 18  Temp: 98.4 F (36.9 C) 98.4 F  (36.9 C)  SpO2: 99% 99%   Vitals:   01/09/23 2312 01/10/23 0327 01/10/23 0739 01/10/23 1135  BP: (!) 153/85 (!) 169/84 (!) 156/79 (!) 163/95  Pulse: 81 75 72 77  Resp: 18 18 18 18   Temp: 98.4 F (36.9 C) 98.2 F (36.8 C) 98.4 F (36.9 C) 98.4 F (36.9 C)  TempSrc: Oral Oral Oral Axillary  SpO2: 97% 97% 99% 99%  Weight:      Height:        General: Pt is alert, awake, not in acute distress Cardiovascular: RRR, S1/S2 +, no rubs, no gallops Respiratory: CTA bilaterally, no wheezing, no rhonchi Abdominal: Soft, NT, ND, bowel sounds + Extremities: no edema, no cyanosis  Discharge Instructions  Discharge Instructions     Ambulatory referral to Neurology   Complete by: As directed    An appointment is requested in approximately: 2 weeks      Allergies as of 01/10/2023       Reactions   Simvastatin Other (See Comments)   MUSCLE ACHES REAL BAD   Atorvastatin    Other reaction(s): stiffness, decreased energy   Lisinopril Cough   Rosuvastatin    Other reaction(s): weakness        Medication List     TAKE these medications    acetaminophen 325 MG tablet Commonly known as: TYLENOL Take 1-2 tablets (325-650 mg total) by mouth every 4 (four) hours as needed for mild pain.   amLODipine 5 MG tablet Commonly known as: NORVASC Take 1 tablet (5 mg total) by mouth daily.   aspirin EC 81 MG tablet Take 81 mg by mouth daily. Swallow whole.   divalproex 250 MG DR tablet Commonly known as: DEPAKOTE Take 3 tablets (750 mg total) by mouth every 12 (twelve) hours.   docusate sodium 100 MG capsule Commonly known as: COLACE Take 1 capsule (100 mg total) by mouth 2 (two) times daily.   FIBER PO Take 1 capsule by mouth daily.   furosemide 20 MG tablet Commonly known as: LASIX Take 1 tablet (20 mg total) by mouth daily.   gabapentin 100 MG capsule Commonly known as: NEURONTIN Take 1 capsule (100 mg total) by mouth at bedtime.   losartan 25 MG tablet Commonly known  as: COZAAR TAKE 1 TABLET DAILY   metoprolol tartrate 25 MG tablet Commonly known as: LOPRESSOR Take 25 mg by mouth 2 (two) times daily.   multivitamin with minerals Tabs tablet Take 1 tablet by mouth daily.   nitroGLYCERIN 0.4 MG SL tablet Commonly known as: NITROSTAT PLACE 1 TABLET UNDER THE TONGUE AT ONSET OF CHEST PAIN EVERY 5 MINTUES UP TO 3 TIMES AS NEEDED What changed:  how much to take how to take this when to take this reasons to take this   polyethylene glycol 17 g packet Commonly known as: MIRALAX / GLYCOLAX Take 17 g by mouth daily as needed for mild constipation.   REFRESH OP Place 1 drop into both eyes daily as needed (dry eye).        Follow-up Information     B[place], Olivia M, [place] Follow up in 1 week(s).   Specialty: Internal Medicine Contact information: [address] [place] [address] [phone]  [facility]. Schedule an appointment as soon as possible for a visit in 1 week(s).   Specialty: Neurology Contact information: [address] [place] [address] [phone]               Allergies  Allergen Reactions   Simvastatin Other (See Comments)    MUSCLE ACHES REAL BAD   Atorvastatin     Other reaction(s): stiffness, decreased energy   Lisinopril Cough   Rosuvastatin     Other reaction(s): weakness    You were cared for by a hospitalist during your hospital stay. If you have any questions about your discharge medications or the care you received while you were in the hospital after you are discharged, you can call the unit and asked to speak with the hospitalist on call if the hospitalist that took care of you is not available. Once you are discharged, your primary care physician will handle any further medical issues. Please note that no refills for any discharge medications will be authorized once you are discharged, as it is imperative that you  return to your primary care physician (or establish a relationship with a primary care physician if you do not have one) for your aftercare needs so that they can reassess your need for medications and monitor your lab values.  You were cared for by a hospitalist during your hospital stay. If you have any questions about your discharge medications or the care you received while you were in the hospital after you are discharged, you can call the unit and asked to speak with the hospitalist on call if the hospitalist that took care of you is not available. Once you are discharged, your primary care physician will handle any further medical issues. Please note that NO REFILLS for any discharge medications will be authorized once you are discharged, as it is imperative that you return to your primary care physician (or establish a relationship with a primary care physician if you do not have one) for your aftercare needs so that they can reassess your need for medications and monitor your lab values.  Please request your Prim.MD to go over all Hospital Tests and Procedure/Radiological results at the follow up, please get all Hospital records sent to your Prim MD by signing hospital release before you go home.  Get CBC, CMP, 2 view Chest X ray checked  by Primary MD during your next visit or SNF MD in 5-7 days ( we routinely change or add medications that can affect your baseline labs and fluid status, therefore we recommend that you get the mentioned basic workup next visit with your PCP, your PCP may decide not to get them or add new tests based on their clinical decision)  On your next visit with your primary care physician please Get Medicines reviewed and adjusted.  If you experience worsening of your admission symptoms, develop shortness of breath, life threatening emergency, suicidal or homicidal thoughts you must seek medical attention immediately by calling 911 or calling your MD immediately  if  symptoms less severe.  You Must read complete instructions/literature along with all the possible adverse reactions/side effects for all the Medicines you take and that have been prescribed to you. Take any new Medicines after you have completely understood and accpet all the possible adverse reactions/side effects.   Do not drive, operate heavy machinery, perform activities at heights, swimming or participation in water activities or provide baby sitting services if your were  admitted for syncope or siezures until you have seen by Primary MD or a Neurologist and advised to do so again.  Do not drive when taking Pain medications.   Procedures/Studies: Overnight EEG with video  Result Date: 01/06/2023 Windell Norfolk, MD     01/06/2023 10:37 PM Patient Name: Adam Arellano MRN: [id number] Epilepsy Attending: Windell Norfolk Referring Physician/Provider: Date: 01/06/2023 Start time: 0230 End Time: 1625 Duration:  13 hours 55 minutes Patient history: 89 year old man with history of stroke ,HTN, HLD, cognitive impairment, seizure. EEG to assess for seizures Level of alertness: Restless and  lethargic AEDs during EEG study: Levetiracetam Technical aspects: This EEG study was done with scalp electrodes positioned according to the 10-20 International system of electrode placement. Electrical activity was reviewed with band pass filter of 1-70Hz , sensitivity of 7 uV/mm, display speed of 37mm/sec with a 60Hz  notched filter applied as appropriate. EEG data were recorded continuously and digitally stored.  Video monitoring was available and reviewed as appropriate. Description: The background consists of delta-theta activity of moderate voltage (25-35 uV) seen predominantly in posterior head regions. Drowsiness was characterized by attenuation of the posterior background rhythm. Sleep was not seen. EEG showed continuous generalized polymorphic sharply contoured 3 to 6 Hz theta-delta slowing. There was additional left  posterior quadrant focal slowing.  Hyperventilation and photic stimulation were not performed.   ABNORMALITY - Continuous slow, generalized - Left posterior quadrant focal slowing IMPRESSION: This study is suggestive of mild to moderate diffuse encephalopathy, nonspecific etiology but likely related to sedation, toxic-metabolic etiology, anoxic/hypoxic brain injury. There is also evidence of cortical dysfunction arising from left parietoccipital region, nonspecific etiology, likely secondary to underlying structural abnormality. No seizures seen. Windell Norfolk   MR BRAIN WO CONTRAST  Result Date: 01/05/2023 CLINICAL DATA:  Code stroke EXAM: MRI HEAD WITHOUT CONTRAST TECHNIQUE: Multiplanar, multiecho pulse sequences of the brain and surrounding structures were obtained without intravenous contrast. COMPARISON:  Same-day CT head, brain MRI 04/03/2022 FINDINGS: A truncated protocol was performed. Motion degraded DWI and FLAIR sequences were obtained. Brain: There is no large vessel territorial infarct. There is mildly elevated DWI signal in the left thalamus along the left lateral ventricle which may be artifactual or a small acute infarct. The axial FLAIR sequence is markedly motion degraded. There is parenchymal volume loss with prominence of the ventricular system and extra-axial CSF spaces. There is a remote left PCA infarct, better seen on same-day CT. There is no midline shift. Vascular: Not well assessed. Skull and upper cervical spine: Not well assessed. Sinuses/Orbits: The imaged paranasal sinuses are clear. The globes and orbits are not well assessed. Other: None. IMPRESSION: 1. Truncated study with markedly motion degraded DWI and FLAIR sequences obtained. 2. Possible small acute infarct versus artifact in the left thalamus. Electronically Signed   By: Lesia Hausen M.D.   On: 01/05/2023 14:07   CT ANGIO HEAD NECK W WO CM (CODE STROKE)  Result Date: 01/05/2023 CLINICAL DATA:  Right-sided weakness and  aphasia EXAM: CT ANGIOGRAPHY HEAD AND NECK WITH AND WITHOUT CONTRAST TECHNIQUE: Multidetector CT imaging of the head and neck was performed using the standard protocol during bolus administration of intravenous contrast. Multiplanar CT image reconstructions and MIPs were obtained to evaluate the vascular anatomy. Carotid stenosis measurements (when applicable) are obtained utilizing NASCET criteria, using the distal internal carotid diameter as the denominator. RADIATION DOSE REDUCTION: This exam was performed according to the departmental dose-optimization program which includes automated exposure control, adjustment of the mA  and/or kV according to patient size and/or use of iterative reconstruction technique. CONTRAST:  75mL OMNIPAQUE IOHEXOL 350 MG/ML SOLN COMPARISON:  MRA/MR head 04/03/2022 FINDINGS: CTA NECK FINDINGS Aortic arch: There is calcified plaque in the imaged aortic arch. The origins of the major branch vessels are patent. The subclavian arteries are patent to the level imaged. Right carotid system: The right common carotid artery is patent. There is bulky plaque in the proximal internal carotid artery resulting in up to approximately 70-80% stenosis. The distal internal carotid artery is patent. The external carotid artery is patent. There is no evidence of dissection or aneurysm. Left carotid system: The left common carotid artery is patent with mixed plaque resulting in up to approximately 60% stenosis. There is mild plaque at the bifurcation without significant stenosis of the internal carotid artery. The external carotid artery is patent. There is no evidence of dissection or aneurysm. Vertebral arteries: The vertebral arteries are patent, without hemodynamically significant stenosis or occlusion there is no evidence of dissection or aneurysm. Skeleton: There is no acute osseous abnormality or suspicious osseous lesion. There is no visible canal hematoma. There is advanced multilevel  degenerative change in the cervical spine without evidence of high-grade spinal canal stenosis. Other neck: The soft tissues of the neck are unremarkable. Upper chest: There is biapical pleural scarring. Review of the MIP images confirms the above findings CTA HEAD FINDINGS Anterior circulation: There is calcified plaque in the intracranial ICAs without significant stenosis or occlusion. The bilateral MCAs are patent, without proximal stenosis or occlusion. The bilateral ACAS are patent, without proximal stenosis or occlusion. There is a 3 mm left MCA bifurcation aneurysm, unchanged. Posterior circulation: The bilateral V4 segments are patent. The basilar artery is patent. The major cerebellar arteries appear patent. The left PCA is diminutive beyond the P2 segment. Otherwise, the PCAs are patent, without other proximal high-grade stenosis or occlusion. Posterior communicating arteries are not identified. There is no aneurysm or AVM. Venous sinuses: Patent. Anatomic variants: None. Review of the MIP images confirms the above findings IMPRESSION: 1. No emergent large vessel occlusion. 2. Calcified plaque in the proximal right internal carotid artery resulting in approximately 70-80% stenosis. 3. Mixed plaque in the left common carotid artery resulting in approximately 60% stenosis. 4. Diminutive left PCA beyond the P2 segment. Otherwise, patent intracranial vasculature without other proximal high-grade stenosis or occlusion. 5. 3 mm left MCA bifurcation aneurysm, unchanged. Electronically Signed   By: Lesia Hausen M.D.   On: 01/05/2023 13:43   CT HEAD CODE STROKE WO CONTRAST  Result Date: 01/05/2023 CLINICAL DATA:  Code stroke.  Neuro deficit, acute, stroke suspected EXAM: CT HEAD WITHOUT CONTRAST TECHNIQUE: Contiguous axial images were obtained from the base of the skull through the vertex without intravenous contrast. RADIATION DOSE REDUCTION: This exam was performed according to the departmental  dose-optimization program which includes automated exposure control, adjustment of the mA and/or kV according to patient size and/or use of iterative reconstruction technique. COMPARISON:  CT head October 29, 23. FINDINGS: Motion limited. Brain: Remote left PCA territory infarct. Remote left posterior limb of the internal capsule infarct. No evidence of acute large vascular territory infarct, acute hemorrhage, mass lesion or midline shift. Cerebral atrophy with ex vacuo ventricular dilation. Vascular: No hyperdense vessel. Skull: No acute fracture. Sinuses/Orbits: Clear sinuses.  No acute orbital findings. Other: No mastoid effusions. ASPECTS [facility] Stroke Program Early CT Score) Total score (0-10 with 10 being normal): 10. IMPRESSION: 1. No evidence of acute large vascular  territory infarct or acute hemorrhage. ASPECTS is 10. 2. Remote left PCA infarcts and chronic microvascular ischemic change. Code stroke imaging results were communicated on 01/05/2023 at 1:22 pm to provider Charm Barges via telephone, who verbally acknowledged these results. Electronically Signed   By: Feliberto Harts M.D.   On: 01/05/2023 13:24     The results of significant diagnostics from this hospitalization (including imaging, microbiology, ancillary and laboratory) are listed below for reference.     Microbiology: No results found for this or any previous visit (from the past 240 hour(s)).   Labs: BNP (last 3 results) No results for input(s): "BNP" in the last 8760 hours. Basic Metabolic Panel: Recent Labs  Lab 01/06/23 0253 01/07/23 0630 01/08/23 0731 01/09/23 0300 01/09/23 1342 01/10/23 0155  NA 138 138 136 137  --  136  K 3.4* 3.6 4.2 4.0  --  4.2  CL 106 103 104 105  --  107  CO2 24 22 21* 23  --  16*  GLUCOSE 111* 83 80 90  --  75  BUN 22 22 26* 32*  --  31*  CREATININE 1.63* 1.79* 1.81* 2.20* 2.04* 1.80*  CALCIUM 8.8* 8.7* 8.7* 8.3*  --  8.0*  MG  --   --  2.1 2.1  --  2.0   Liver Function Tests: Recent  Labs  Lab 01/05/23 1247 01/06/23 0253  AST 17 13*  ALT 11 9  ALKPHOS 81 79  BILITOT 0.2* 0.6  PROT 6.3* 5.8*  ALBUMIN 3.3* 3.0*   No results for input(s): "LIPASE", "AMYLASE" in the last 168 hours. No results for input(s): "AMMONIA" in the last 168 hours. CBC: Recent Labs  Lab 01/05/23 1247 01/06/23 0253 01/08/23 0731 01/09/23 0300 01/10/23 0155  WBC 11.8* 7.9 9.6 8.7 8.9  NEUTROABS 6.2  --   --   --   --   HGB 10.8* 9.8* 12.2* 10.9* 11.2*  HCT 35.6* 31.3* 38.1* 34.7* 34.9*  MCV 96.5 92.1 89.4 90.8 90.6  PLT 312 249 330 296 187   Cardiac Enzymes: No results for input(s): "CKTOTAL", "CKMB", "CKMBINDEX", "TROPONINI" in the last 168 hours. BNP: Invalid input(s): "POCBNP" CBG: Recent Labs  Lab 01/05/23 1247 01/05/23 1507  GLUCAP 114* 114*   D-Dimer No results for input(s): "DDIMER" in the last 72 hours. Hgb A1c No results for input(s): "HGBA1C" in the last 72 hours. Lipid Profile No results for input(s): "CHOL", "HDL", "LDLCALC", "TRIG", "CHOLHDL", "LDLDIRECT" in the last 72 hours. Thyroid function studies No results for input(s): "TSH", "T4TOTAL", "T3FREE", "THYROIDAB" in the last 72 hours.  Invalid input(s): "FREET3" Anemia work up No results for input(s): "VITAMINB12", "FOLATE", "FERRITIN", "TIBC", "IRON", "RETICCTPCT" in the last 72 hours. Urinalysis    Component Value Date/Time   COLORURINE STRAW (A) 01/05/2023 1524   APPEARANCEUR CLEAR 01/05/2023 1524   LABSPEC 1.018 01/05/2023 1524   PHURINE 6.0 01/05/2023 1524   GLUCOSEU NEGATIVE 01/05/2023 1524   HGBUR SMALL (A) 01/05/2023 1524   BILIRUBINUR NEGATIVE 01/05/2023 1524   KETONESUR NEGATIVE 01/05/2023 1524   PROTEINUR NEGATIVE 01/05/2023 1524   UROBILINOGEN 1.0 08/26/2007 1932   NITRITE NEGATIVE 01/05/2023 1524   LEUKOCYTESUR NEGATIVE 01/05/2023 1524   Sepsis Labs Recent Labs  Lab 01/06/23 0253 01/08/23 0731 01/09/23 0300 01/10/23 0155  WBC 7.9 9.6 8.7 8.9   Microbiology No results found  for this or any previous visit (from the past 240 hour(s)).   Time coordinating discharge:  I have spent 35 minutes face to face with the  patient and on the ward discussing the patients care, assessment, plan and disposition with other care givers. >50% of the time was devoted counseling the patient about the risks and benefits of treatment/Discharge disposition and coordinating care.   SIGNED:   Burnadette Pop, MD  [facility] 01/10/2023, 1:41 PM   If 7PM-7AM, please contact night-coverage

## 2023-01-09 NOTE — Consult Note (Signed)
   [facility] Inpatient Consult   01/09/2023  LORREN COMMANDER 1932-03-14 [id number]  [facility] [[facility]]  Accountable Care Organization [ACO] Patient: [facility]  Primary Care Provider:  Delma Officer, PA with Deboraha Sprang at [facility]   Patient was reviewed for readmission scre for extreme high risk score and for post hospital disposition needs.  Patient is being recommended for a SNF level of care post hospital.  Patient was screened for hospitalization and on behalf of [facility] Care Coordination to assess for post hospital community care needs.  Patient is being considered for a skilled nursing facility level of care for post hospital transition. 2:10 pm Patient up in recliner on rounds, no family currently at the bedside. Anticipate needs to be met at a SNF level of care.  If the patient goes to a [facility] affiliated facility then, patient can be followed by [facility] RN with traditional Medicare and approved Medicare Advantage plans.    Plan:   Follow to notify the Community [facility] RN can follow for any known or needs for transitional care needs for returning to post facility care coordination needs to return to community. Patient is with [facility] provider noted.  For questions or referrals, please contact:   Charlesetta Shanks, RN BSN CCM [facility]  [phone] business mobile phone Toll free office [phone]  *Concierge Line  [phone] Fax number: [phone] [email or web address]

## 2023-01-09 NOTE — Progress Notes (Signed)
Physical Therapy Treatment Patient Details Name: Adam Arellano MRN: [id number] DOB: 08-27-1931 Today's Date: 01/09/2023   History of Present Illness Pt is 89 yo male presenting to [facility] with R gaze preference and not able to follow commands. En route via EMS pt had tonic-clonic seizure and received Versed. MRI was performed significant for chronic changes. PMH significant for CVA, PVD, Asthma, CAD, LPR, HTN, CKD III, Macular degeneration, mitral valve disease, transient global amnesia in 2009, hypertension, hyperlipidemia, coronary artery disease with NSTEMI status post multiple PCI's, prostate cancer status post radical prostatectomy, severe mitral regurgitation status post TEER ( 08/19/2020).    PT Comments  Patient received in bed. Alert, agrees to PT session. Patient is mod I with bed mobility and sit to stand transfer. Requires cues for safety, awareness of surroundings. He is able to walk 60 feet with RW and cues for safety with AD. Patient also reports B hip pain with ambulation. He will continue to benefit from skilled PT to improve endurance, strength and safety with mobility.       If plan is discharge home, recommend the following: A little help with walking and/or transfers;Assistance with cooking/housework;Assist for transportation;Help with stairs or ramp for entrance;A little help with bathing/dressing/bathroom   Can travel by private vehicle     Yes  Equipment Recommendations  None recommended by PT    Recommendations for Other Services       Precautions / Restrictions Precautions Precautions: Fall Restrictions Weight Bearing Restrictions: No     Mobility  Bed Mobility Overal bed mobility: Modified Independent Bed Mobility: Supine to Sit     Supine to sit: HOB elevated, Modified independent (Device/Increase time)          Transfers Overall transfer level: Modified independent Equipment used: Rolling walker (2 wheels) Transfers: Sit to/from Stand Sit to  Stand: Supervision                Ambulation/Gait Ambulation/Gait assistance: Min Chemical engineer (Feet): 60 Feet Assistive device: Rolling walker (2 wheels) Gait Pattern/deviations: Step-through pattern, Trunk flexed Gait velocity: decr     General Gait Details: Cues for keeping walker close multiple times during walk, patient reports hip pain with ambulation.   Stairs             Wheelchair Mobility     Tilt Bed    Modified Rankin (Stroke Patients Only)       Balance Overall balance assessment: Needs assistance Sitting-balance support: Feet supported Sitting balance-Leahy Scale: Fair     Standing balance support: Bilateral upper extremity supported, During functional activity Standing balance-Leahy Scale: Fair Standing balance comment: Min A intermittently, heavy reliance on AD with anterior COM over BOS                            Cognition Arousal/Alertness: Awake/alert Behavior During Therapy: WFL for tasks assessed/performed Overall Cognitive Status: History of cognitive impairments - at baseline                                 General Comments: Requires cues for safe use of AD.        Exercises      General Comments        Pertinent Vitals/Pain Pain Assessment Pain Assessment: Faces Faces Pain Scale: Hurts little more Pain Location: B hips Pain Descriptors / Indicators: Discomfort, Sore Pain Intervention(s): Monitored during  session, Repositioned    Home Living                          Prior Function            PT Goals (current goals can now be found in the care plan section) Acute Rehab PT Goals Patient Stated Goal: To get stronger to return home with family PT Goal Formulation: With patient/family Time For Goal Achievement: 01/21/23 Potential to Achieve Goals: Fair Progress towards PT goals: Progressing toward goals    Frequency    Min 1X/week      PT Plan Current plan  remains appropriate    Co-evaluation              AM-PAC PT "6 Clicks" Mobility   Outcome Measure  Help needed turning from your back to your side while in a flat bed without using bedrails?: None Help needed moving from lying on your back to sitting on the side of a flat bed without using bedrails?: None Help needed moving to and from a bed to a chair (including a wheelchair)?: A Little Help needed standing up from a chair using your arms (e.g., wheelchair or bedside chair)?: A Little Help needed to walk in hospital room?: A Lot Help needed climbing 3-5 steps with a railing? : A Lot 6 Click Score: 18    End of Session Equipment Utilized During Treatment: Gait belt Activity Tolerance: Patient limited by pain;Patient limited by fatigue Patient left: in chair;with chair alarm set;with family/visitor present Nurse Communication: Mobility status PT Visit Diagnosis: Unsteadiness on feet (R26.81);Other abnormalities of gait and mobility (R26.89);Muscle weakness (generalized) (M62.81);Difficulty in walking, not elsewhere classified (R26.2);Pain Pain - Right/Left:  (B) Pain - part of body: Hip     Time: 4098-1191 PT Time Calculation (min) (ACUTE ONLY): 16 min  Charges:    $Gait Training: 8-22 mins PT General Charges $$ ACUTE PT VISIT: 1 Visit                      , PT, [facility] 01/09/23,11:11 AM

## 2023-01-09 NOTE — TOC Progression Note (Addendum)
Transition of Care [facility]) - Progression Note    Patient Details  Name: MARCELLE KOMOROSKI MRN: [id number] Date of Birth: May 19, 1932  [facility] of [facility]) CM/SW Contact  Erin Sons, [place] Phone Number: 01/09/2023, 12:26 PM  Clinical Narrative:     CSW called pt's daughter and provided SNF bed offers. She will review list and contact CSW back with choice.   1330: Daughter chooses [facility].  CSW confirmed bed with [facility].   [facility] has been started and is pending. Ref# [id number]  Expected Discharge Plan: Skilled Nursing Facility Barriers to Discharge: Continued Medical Work up  Expected Discharge Plan and Services In-house Referral: Clinical Social Work     Living arrangements for the past 2 months: Single Family Home                                       Social Determinants of Health (SDOH) Interventions SDOH Screenings   Food Insecurity: No Food Insecurity (01/08/2023)  Housing: Low Risk  (01/08/2023)  Transportation Needs: No Transportation Needs (01/08/2023)  Utilities: Not At Risk (01/08/2023)  Depression (PHQ2-9): Low Risk  (06/30/2022)  Recent Concern: Depression (PHQ2-9) - Medium Risk (05/19/2022)  Social Connections: Unknown (10/18/2021)   Received from [facility]  Tobacco Use: Medium Risk (01/05/2023)    Readmission Risk Interventions    07/04/2022    1:34 PM  Readmission Risk Prevention Plan  Transportation Screening Complete  HRI or Home Care Consult Complete  SW Recovery Care/Counseling Consult Complete  Palliative Care Screening Not Applicable  Skilled Nursing Facility Complete

## 2023-01-09 NOTE — Plan of Care (Signed)
  Problem: Education: Goal: Expressions of having a comfortable level of knowledge regarding the disease process will increase Outcome: Progressing   Problem: Education: Goal: Knowledge of General Education information will improve Description: Including pain rating scale, medication(s)/side effects and non-pharmacologic comfort measures Outcome: Progressing   Problem: Health Behavior/Discharge Planning: Goal: Ability to manage health-related needs will improve Outcome: Progressing   Problem: Clinical Measurements: Goal: Ability to maintain clinical measurements within normal limits will improve Outcome: Progressing   Problem: Activity: Goal: Risk for activity intolerance will decrease Outcome: Progressing   Problem: Education: Goal: Knowledge of disease or condition will improve Outcome: Progressing   Problem: Ischemic Stroke/TIA Tissue Perfusion: Goal: Complications of ischemic stroke/TIA will be minimized Outcome: Progressing   Problem: Self-Care: Goal: Ability to participate in self-care as condition permits will improve Outcome: Progressing

## 2023-01-09 NOTE — Plan of Care (Signed)
  Problem: Education: Goal: Expressions of having a comfortable level of knowledge regarding the disease process will increase Outcome: Progressing   Problem: Coping: Goal: Ability to adjust to condition or change in health will improve Outcome: Progressing Goal: Ability to identify appropriate support needs will improve Outcome: Progressing   Problem: Health Behavior/Discharge Planning: Goal: Compliance with prescribed medication regimen will improve Outcome: Progressing   Problem: Medication: Goal: Risk for medication side effects will decrease Outcome: Progressing   Problem: Clinical Measurements: Goal: Complications related to the disease process, condition or treatment will be avoided or minimized Outcome: Progressing Goal: Diagnostic test results will improve Outcome: Progressing   Problem: Safety: Goal: Verbalization of understanding the information provided will improve Outcome: Progressing   Problem: Self-Concept: Goal: Level of anxiety will decrease Outcome: Progressing Goal: Ability to verbalize feelings about condition will improve Outcome: Progressing   Problem: Education: Goal: Knowledge of General Education information will improve Description: Including pain rating scale, medication(s)/side effects and non-pharmacologic comfort measures Outcome: Progressing   Problem: Health Behavior/Discharge Planning: Goal: Ability to manage health-related needs will improve Outcome: Progressing   Problem: Clinical Measurements: Goal: Ability to maintain clinical measurements within normal limits will improve Outcome: Progressing Goal: Will remain free from infection Outcome: Progressing Goal: Diagnostic test results will improve Outcome: Progressing Goal: Respiratory complications will improve Outcome: Progressing Goal: Cardiovascular complication will be avoided Outcome: Progressing   Problem: Activity: Goal: Risk for activity intolerance will  decrease Outcome: Progressing   Problem: Nutrition: Goal: Adequate nutrition will be maintained Outcome: Progressing   Problem: Coping: Goal: Level of anxiety will decrease Outcome: Progressing   Problem: Elimination: Goal: Will not experience complications related to bowel motility Outcome: Progressing Goal: Will not experience complications related to urinary retention Outcome: Progressing   Problem: Pain Managment: Goal: General experience of comfort will improve Outcome: Progressing   Problem: Safety: Goal: Ability to remain free from injury will improve Outcome: Progressing   Problem: Skin Integrity: Goal: Risk for impaired skin integrity will decrease Outcome: Progressing   Problem: Safety: Goal: Non-violent Restraint(s) Outcome: Progressing   Problem: Education: Goal: Knowledge of disease or condition will improve Outcome: Progressing Goal: Knowledge of secondary prevention will improve (MUST DOCUMENT ALL) Outcome: Progressing Goal: Knowledge of patient specific risk factors will improve Loraine Leriche N/A or DELETE if not current risk factor) Outcome: Progressing   Problem: Ischemic Stroke/TIA Tissue Perfusion: Goal: Complications of ischemic stroke/TIA will be minimized Outcome: Progressing   Problem: Coping: Goal: Will verbalize positive feelings about self Outcome: Progressing Goal: Will identify appropriate support needs Outcome: Progressing   Problem: Self-Care: Goal: Ability to participate in self-care as condition permits will improve Outcome: Progressing Goal: Verbalization of feelings and concerns over difficulty with self-care will improve Outcome: Progressing Goal: Ability to communicate needs accurately will improve Outcome: Progressing   Problem: Nutrition: Goal: Risk of aspiration will decrease Outcome: Progressing Goal: Dietary intake will improve Outcome: Progressing

## 2023-01-09 NOTE — Care Management Important Message (Signed)
Important Message  Patient Details  Name: Adam Arellano MRN: [id number] Date of Birth: [birth year]   Medicare Important Message Given:  Yes       01/09/2023, 1:03 PM

## 2023-01-10 DIAGNOSIS — R569 Unspecified convulsions: Secondary | ICD-10-CM | POA: Diagnosis not present

## 2023-01-10 NOTE — Plan of Care (Signed)
  Problem: Education: Goal: Knowledge of General Education information will improve Description Including pain rating scale, medication(s)/side effects and non-pharmacologic comfort measures Outcome: Progressing   

## 2023-01-10 NOTE — Progress Notes (Signed)
The patient is discharged from the hospital in stable condition. He was accompanied by 2 [facility] staff. No c/o pain or any acute distress noted. Called his daughter [name] to update on his transfer.

## 2023-01-10 NOTE — TOC Transition Note (Signed)
Transition of Care [facility]) - CM/SW Discharge Note   Patient Details  Name: Adam Arellano MRN: [id number] Date of Birth: 06-09-1931  Transition of Care [facility]) CM/SW Contact:  Baldemar Lenis, LCSW Phone Number: 01/10/2023, 2:14 PM   Clinical Narrative:   Patient received insurance authorization to admit to [facility], CSW confirmed with Admissions that they had a bed available for patient. CSW spoke with daughter, [name], who is in agreement. [name] initially discussed providing transportation via private vehicle, but then called CSW back that she did not feel comfortable given patient's weakness and seizures to provide transportation herself. Transport arranged with [facility] for next available.       Final next level of care: Skilled Nursing Facility Barriers to Discharge: Barriers Resolved   Patient Goals and CMS Choice CMS Medicare.gov Compare Post Acute Care list provided to:: Patient Represenative (must comment) (Pt's daughter [name]) Choice offered to / list presented to : Adult Children  Discharge Placement                Patient chooses bed at:  [facility]) Patient to be transferred to facility by: Family Name of family member notified: [name] Patient and family notified of of transfer: 01/10/23  Discharge Plan and Services Additional resources added to the After Visit Summary for   In-house Referral: Clinical Social Work                                   Social Determinants of Health (SDOH) Interventions SDOH Screenings   Food Insecurity: No Food Insecurity (01/08/2023)  Housing: Low Risk  (01/08/2023)  Transportation Needs: No Transportation Needs (01/08/2023)  Utilities: Not At Risk (01/08/2023)  Depression (PHQ2-9): Low Risk  (06/30/2022)  Recent Concern: Depression (PHQ2-9) - Medium Risk (05/19/2022)  Social Connections: Unknown (10/18/2021)   Received from [facility]  Tobacco Use: Medium Risk (01/05/2023)     Readmission Risk  Interventions    07/04/2022    1:34 PM  Readmission Risk Prevention Plan  Transportation Screening Complete  HRI or Home Care Consult Complete  SW Recovery Care/Counseling Consult Complete  Palliative Care Screening Not Applicable  Skilled Nursing Facility Complete

## 2023-01-10 NOTE — Progress Notes (Signed)
PROGRESS NOTE  Adam RIEHM  ZOX:[id number] DOB: 1931-10-27 DOA: 01/05/2023 PCP: Delma Officer, PA   Brief Narrative: Patient is a 89 year old with history of CVA, anemia, PVD, asthma, CAD, LPR, HTN, CKD stage IIIb, macular degeneration, mitral valve regurgitation status post repair, prostate cancer status post prostatectomy presented to the hospital with right gaze preference and not able to follow commands.On  route ,patient had tonic-clonic seizure and received Versed. Initially code stroke was called and patient was seen by neurology. Initial CT head and CTA head and neck did not show any acute pathology. MRI brain was negative. Did show chronic changes. Patient was given Keppra and Ativan. Eventually transitioned to Depakote which patient is tolerating well. PT/OT recommended SNF.  Medically stable for discharge whenever possible.  Assessment & Plan:  Principal Problem:   Seizure (HCC) Active Problems:   Primary hypertension   Nonrheumatic mitral valve regurgitation   H/O mitral valve repair   PVD (peripheral vascular disease) (HCC)   Coronary artery disease   Advanced nonexudative age-related macular degeneration of right eye without subfoveal involvement   Advanced nonexudative age-related macular degeneration of left eye without subfoveal involvement   Anemia in chronic kidney disease   Stage 3b chronic kidney disease (HCC)   History of malignant neoplasm of prostate   Laryngopharyngeal reflux (LPR)   History of CVA (cerebrovascular accident)   Asthma, chronic   H/O radical prostatectomy   Mild cognitive impairment  Seizures CT head, MRI brain, CT head and neck are negative for acute pathology.  EEG showed chronic slowing.  Currently doing well on Depakote.  Follow-up with neurology as an outpatient   History of CVA On daily aspirin   Hypokalemia Supplemented and corrected   Hypertension Continue amlodipine, losartan   CAD PVD On baby aspirin    Anemia Currently hemoglobin stable   AKI on CKD 3b Kidney function close to baseline.  Takes losartan and Lasix at home.  Check BMP in the next 3 to 5 days   Mild cognitive impairment Chronic   Mitral valve regurgitation s/p repair -Stable.  Follow-up with cardiology as an outpatient   History of prostate cancer Status post prostatectomy.  Follow-up outpatient urology         DVT prophylaxis:enoxaparin (LOVENOX) injection 30 mg Start: 01/06/23 1800     Code Status: DNR  Family Communication: None at bedside  Patient status:Inpatient  Patient is from :home  Anticipated discharge to:SnF  Estimated DC date:today   Consultants: Neurology  Procedures:EEg  Antimicrobials:  Anti-infectives (From admission, onward)    None       Subjective: Patient seen and examined the bedside today.  Hemodynamically stable.  Lying in bed.  Alert and awake .  Appears comfortable.  Denies any complaints today.  Medically stable for discharge to SNF whenever possible  Objective: Vitals:   01/09/23 1933 01/09/23 2312 01/10/23 0327 01/10/23 0739  BP: (!) 147/70 (!) 153/85 (!) 169/84 (!) 156/79  Pulse: 83 81 75 72  Resp: 19 18 18 18   Temp: 98.3 F (36.8 C) 98.4 F (36.9 C) 98.2 F (36.8 C) 98.4 F (36.9 C)  TempSrc: Oral Oral Oral Oral  SpO2: 98% 97% 97% 99%  Weight:      Height:        Intake/Output Summary (Last 24 hours) at 01/10/2023 0833 Last data filed at 01/10/2023 0326 Gross per 24 hour  Intake 1199.78 ml  Output 1200 ml  Net -0.22 ml   Filed Weights   01/06/23  1400 01/09/23 1121  Weight: 69.6 kg 69.6 kg    Examination:  General exam: Overall comfortable, not in distress HEENT: PERRL Respiratory system:  no wheezes or crackles  Cardiovascular system: S1 & S2 heard, RRR.  Gastrointestinal system: Abdomen is nondistended, soft and nontender. Central nervous system: Alert and oriented Extremities: No edema, no clubbing ,no cyanosis Skin: No rashes, no  ulcers,no icterus     Data Reviewed: I have personally reviewed following labs and imaging studies  CBC: Recent Labs  Lab 01/05/23 1247 01/06/23 0253 01/08/23 0731 01/09/23 0300 01/10/23 0155  WBC 11.8* 7.9 9.6 8.7 8.9  NEUTROABS 6.2  --   --   --   --   HGB 10.8* 9.8* 12.2* 10.9* 11.2*  HCT 35.6* 31.3* 38.1* 34.7* 34.9*  MCV 96.5 92.1 89.4 90.8 90.6  PLT 312 249 330 296 187   Basic Metabolic Panel: Recent Labs  Lab 01/06/23 0253 01/07/23 0630 01/08/23 0731 01/09/23 0300 01/09/23 1342 01/10/23 0155  NA 138 138 136 137  --  136  K 3.4* 3.6 4.2 4.0  --  4.2  CL 106 103 104 105  --  107  CO2 24 22 21* 23  --  16*  GLUCOSE 111* 83 80 90  --  75  BUN 22 22 26* 32*  --  31*  CREATININE 1.63* 1.79* 1.81* 2.20* 2.04* 1.80*  CALCIUM 8.8* 8.7* 8.7* 8.3*  --  8.0*  MG  --   --  2.1 2.1  --  2.0     No results found for this or any previous visit (from the past 240 hour(s)).   Radiology Studies: No results found.  Scheduled Meds:  amLODipine  5 mg Oral Daily   aspirin EC  81 mg Oral Daily   divalproex  750 mg Oral Q12H   enoxaparin (LOVENOX) injection  30 mg Subcutaneous Q24H   losartan  25 mg Oral Daily   metoprolol tartrate  25 mg Oral BID   sodium chloride flush  3 mL Intravenous Q12H   Continuous Infusions:   LOS: 4 days   Burnadette Pop, MD [facility] P8/12/2022, 8:33 AM

## 2023-01-11 DIAGNOSIS — M7021 Olecranon bursitis, right elbow: Secondary | ICD-10-CM | POA: Diagnosis not present

## 2023-01-11 DIAGNOSIS — R569 Unspecified convulsions: Secondary | ICD-10-CM | POA: Diagnosis not present

## 2023-01-11 DIAGNOSIS — M6281 Muscle weakness (generalized): Secondary | ICD-10-CM | POA: Diagnosis not present

## 2023-01-11 DIAGNOSIS — G909 Disorder of the autonomic nervous system, unspecified: Secondary | ICD-10-CM | POA: Diagnosis not present

## 2023-01-11 DIAGNOSIS — R278 Other lack of coordination: Secondary | ICD-10-CM | POA: Diagnosis not present

## 2023-01-11 DIAGNOSIS — R29898 Other symptoms and signs involving the musculoskeletal system: Secondary | ICD-10-CM | POA: Diagnosis not present

## 2023-01-11 DIAGNOSIS — I2510 Atherosclerotic heart disease of native coronary artery without angina pectoris: Secondary | ICD-10-CM | POA: Diagnosis not present

## 2023-01-11 DIAGNOSIS — I10 Essential (primary) hypertension: Secondary | ICD-10-CM | POA: Diagnosis not present

## 2023-01-11 DIAGNOSIS — M25421 Effusion, right elbow: Secondary | ICD-10-CM | POA: Diagnosis not present

## 2023-01-11 DIAGNOSIS — R52 Pain, unspecified: Secondary | ICD-10-CM | POA: Diagnosis not present

## 2023-01-11 DIAGNOSIS — Z8673 Personal history of transient ischemic attack (TIA), and cerebral infarction without residual deficits: Secondary | ICD-10-CM | POA: Diagnosis not present

## 2023-01-11 DIAGNOSIS — M7022 Olecranon bursitis, left elbow: Secondary | ICD-10-CM | POA: Diagnosis not present

## 2023-01-11 DIAGNOSIS — Z741 Need for assistance with personal care: Secondary | ICD-10-CM | POA: Diagnosis not present

## 2023-01-11 DIAGNOSIS — N1832 Chronic kidney disease, stage 3b: Secondary | ICD-10-CM | POA: Diagnosis not present

## 2023-01-12 DIAGNOSIS — R29898 Other symptoms and signs involving the musculoskeletal system: Secondary | ICD-10-CM | POA: Diagnosis not present

## 2023-01-12 DIAGNOSIS — G909 Disorder of the autonomic nervous system, unspecified: Secondary | ICD-10-CM | POA: Diagnosis not present

## 2023-01-12 DIAGNOSIS — I10 Essential (primary) hypertension: Secondary | ICD-10-CM | POA: Diagnosis not present

## 2023-01-12 DIAGNOSIS — I2510 Atherosclerotic heart disease of native coronary artery without angina pectoris: Secondary | ICD-10-CM | POA: Diagnosis not present

## 2023-01-17 DIAGNOSIS — G909 Disorder of the autonomic nervous system, unspecified: Secondary | ICD-10-CM | POA: Diagnosis not present

## 2023-01-17 DIAGNOSIS — I10 Essential (primary) hypertension: Secondary | ICD-10-CM | POA: Diagnosis not present

## 2023-01-17 DIAGNOSIS — R29898 Other symptoms and signs involving the musculoskeletal system: Secondary | ICD-10-CM | POA: Diagnosis not present

## 2023-01-17 DIAGNOSIS — I2510 Atherosclerotic heart disease of native coronary artery without angina pectoris: Secondary | ICD-10-CM | POA: Diagnosis not present

## 2023-01-18 DIAGNOSIS — R29898 Other symptoms and signs involving the musculoskeletal system: Secondary | ICD-10-CM | POA: Diagnosis not present

## 2023-01-18 DIAGNOSIS — I10 Essential (primary) hypertension: Secondary | ICD-10-CM | POA: Diagnosis not present

## 2023-01-18 DIAGNOSIS — G909 Disorder of the autonomic nervous system, unspecified: Secondary | ICD-10-CM | POA: Diagnosis not present

## 2023-01-18 DIAGNOSIS — I2510 Atherosclerotic heart disease of native coronary artery without angina pectoris: Secondary | ICD-10-CM | POA: Diagnosis not present

## 2023-01-22 DIAGNOSIS — I10 Essential (primary) hypertension: Secondary | ICD-10-CM | POA: Diagnosis not present

## 2023-01-22 DIAGNOSIS — G909 Disorder of the autonomic nervous system, unspecified: Secondary | ICD-10-CM | POA: Diagnosis not present

## 2023-01-22 DIAGNOSIS — R29898 Other symptoms and signs involving the musculoskeletal system: Secondary | ICD-10-CM | POA: Diagnosis not present

## 2023-01-22 DIAGNOSIS — I2510 Atherosclerotic heart disease of native coronary artery without angina pectoris: Secondary | ICD-10-CM | POA: Diagnosis not present

## 2023-01-24 DIAGNOSIS — I2510 Atherosclerotic heart disease of native coronary artery without angina pectoris: Secondary | ICD-10-CM | POA: Diagnosis not present

## 2023-01-24 DIAGNOSIS — M25421 Effusion, right elbow: Secondary | ICD-10-CM | POA: Diagnosis not present

## 2023-01-24 DIAGNOSIS — R29898 Other symptoms and signs involving the musculoskeletal system: Secondary | ICD-10-CM | POA: Diagnosis not present

## 2023-01-24 DIAGNOSIS — I10 Essential (primary) hypertension: Secondary | ICD-10-CM | POA: Diagnosis not present

## 2023-01-24 DIAGNOSIS — G909 Disorder of the autonomic nervous system, unspecified: Secondary | ICD-10-CM | POA: Diagnosis not present

## 2023-01-25 DIAGNOSIS — M7022 Olecranon bursitis, left elbow: Secondary | ICD-10-CM | POA: Diagnosis not present

## 2023-01-25 DIAGNOSIS — M7021 Olecranon bursitis, right elbow: Secondary | ICD-10-CM | POA: Diagnosis not present

## 2023-01-26 DIAGNOSIS — R52 Pain, unspecified: Secondary | ICD-10-CM | POA: Diagnosis not present

## 2023-01-29 DIAGNOSIS — Z8673 Personal history of transient ischemic attack (TIA), and cerebral infarction without residual deficits: Secondary | ICD-10-CM | POA: Diagnosis not present

## 2023-01-29 DIAGNOSIS — R569 Unspecified convulsions: Secondary | ICD-10-CM | POA: Diagnosis not present

## 2023-01-29 DIAGNOSIS — I2510 Atherosclerotic heart disease of native coronary artery without angina pectoris: Secondary | ICD-10-CM | POA: Diagnosis not present

## 2023-01-29 DIAGNOSIS — R29898 Other symptoms and signs involving the musculoskeletal system: Secondary | ICD-10-CM | POA: Diagnosis not present

## 2023-01-29 DIAGNOSIS — R278 Other lack of coordination: Secondary | ICD-10-CM | POA: Diagnosis not present

## 2023-01-29 DIAGNOSIS — N1832 Chronic kidney disease, stage 3b: Secondary | ICD-10-CM | POA: Diagnosis not present

## 2023-01-29 DIAGNOSIS — Z741 Need for assistance with personal care: Secondary | ICD-10-CM | POA: Diagnosis not present

## 2023-01-29 DIAGNOSIS — M25421 Effusion, right elbow: Secondary | ICD-10-CM | POA: Diagnosis not present

## 2023-01-29 DIAGNOSIS — M6281 Muscle weakness (generalized): Secondary | ICD-10-CM | POA: Diagnosis not present

## 2023-01-29 DIAGNOSIS — G909 Disorder of the autonomic nervous system, unspecified: Secondary | ICD-10-CM | POA: Diagnosis not present

## 2023-01-29 DIAGNOSIS — I10 Essential (primary) hypertension: Secondary | ICD-10-CM | POA: Diagnosis not present

## 2023-01-30 DIAGNOSIS — Z8673 Personal history of transient ischemic attack (TIA), and cerebral infarction without residual deficits: Secondary | ICD-10-CM | POA: Diagnosis not present

## 2023-01-30 DIAGNOSIS — R569 Unspecified convulsions: Secondary | ICD-10-CM | POA: Diagnosis not present

## 2023-01-30 DIAGNOSIS — N1832 Chronic kidney disease, stage 3b: Secondary | ICD-10-CM | POA: Diagnosis not present

## 2023-01-30 DIAGNOSIS — R278 Other lack of coordination: Secondary | ICD-10-CM | POA: Diagnosis not present

## 2023-01-30 DIAGNOSIS — Z741 Need for assistance with personal care: Secondary | ICD-10-CM | POA: Diagnosis not present

## 2023-01-30 DIAGNOSIS — D649 Anemia, unspecified: Secondary | ICD-10-CM | POA: Diagnosis not present

## 2023-01-30 DIAGNOSIS — I10 Essential (primary) hypertension: Secondary | ICD-10-CM | POA: Diagnosis not present

## 2023-01-30 DIAGNOSIS — M6281 Muscle weakness (generalized): Secondary | ICD-10-CM | POA: Diagnosis not present

## 2023-01-31 DIAGNOSIS — I10 Essential (primary) hypertension: Secondary | ICD-10-CM | POA: Diagnosis not present

## 2023-01-31 DIAGNOSIS — R29898 Other symptoms and signs involving the musculoskeletal system: Secondary | ICD-10-CM | POA: Diagnosis not present

## 2023-01-31 DIAGNOSIS — M25421 Effusion, right elbow: Secondary | ICD-10-CM | POA: Diagnosis not present

## 2023-01-31 DIAGNOSIS — G909 Disorder of the autonomic nervous system, unspecified: Secondary | ICD-10-CM | POA: Diagnosis not present

## 2023-01-31 DIAGNOSIS — M109 Gout, unspecified: Secondary | ICD-10-CM | POA: Diagnosis not present

## 2023-01-31 DIAGNOSIS — I2510 Atherosclerotic heart disease of native coronary artery without angina pectoris: Secondary | ICD-10-CM | POA: Diagnosis not present

## 2023-02-02 DIAGNOSIS — G909 Disorder of the autonomic nervous system, unspecified: Secondary | ICD-10-CM | POA: Diagnosis not present

## 2023-02-02 DIAGNOSIS — M25421 Effusion, right elbow: Secondary | ICD-10-CM | POA: Diagnosis not present

## 2023-02-02 DIAGNOSIS — R29898 Other symptoms and signs involving the musculoskeletal system: Secondary | ICD-10-CM | POA: Diagnosis not present

## 2023-02-02 DIAGNOSIS — M109 Gout, unspecified: Secondary | ICD-10-CM | POA: Diagnosis not present

## 2023-02-02 DIAGNOSIS — I2510 Atherosclerotic heart disease of native coronary artery without angina pectoris: Secondary | ICD-10-CM | POA: Diagnosis not present

## 2023-02-02 DIAGNOSIS — I10 Essential (primary) hypertension: Secondary | ICD-10-CM | POA: Diagnosis not present

## 2023-02-05 DIAGNOSIS — M25421 Effusion, right elbow: Secondary | ICD-10-CM | POA: Diagnosis not present

## 2023-02-05 DIAGNOSIS — I10 Essential (primary) hypertension: Secondary | ICD-10-CM | POA: Diagnosis not present

## 2023-02-05 DIAGNOSIS — I2510 Atherosclerotic heart disease of native coronary artery without angina pectoris: Secondary | ICD-10-CM | POA: Diagnosis not present

## 2023-02-05 DIAGNOSIS — G909 Disorder of the autonomic nervous system, unspecified: Secondary | ICD-10-CM | POA: Diagnosis not present

## 2023-02-05 DIAGNOSIS — M109 Gout, unspecified: Secondary | ICD-10-CM | POA: Diagnosis not present

## 2023-02-05 DIAGNOSIS — R29898 Other symptoms and signs involving the musculoskeletal system: Secondary | ICD-10-CM | POA: Diagnosis not present

## 2023-02-23 DIAGNOSIS — M7021 Olecranon bursitis, right elbow: Secondary | ICD-10-CM | POA: Diagnosis not present

## 2023-02-23 DIAGNOSIS — R4701 Aphasia: Secondary | ICD-10-CM | POA: Diagnosis not present

## 2023-02-23 DIAGNOSIS — N1832 Chronic kidney disease, stage 3b: Secondary | ICD-10-CM | POA: Diagnosis not present

## 2023-02-23 DIAGNOSIS — R296 Repeated falls: Secondary | ICD-10-CM | POA: Diagnosis not present

## 2023-02-23 DIAGNOSIS — Z8673 Personal history of transient ischemic attack (TIA), and cerebral infarction without residual deficits: Secondary | ICD-10-CM | POA: Diagnosis not present

## 2023-02-23 DIAGNOSIS — Z9989 Dependence on other enabling machines and devices: Secondary | ICD-10-CM | POA: Diagnosis not present

## 2023-02-23 DIAGNOSIS — E46 Unspecified protein-calorie malnutrition: Secondary | ICD-10-CM | POA: Diagnosis not present

## 2023-02-23 DIAGNOSIS — R569 Unspecified convulsions: Secondary | ICD-10-CM | POA: Diagnosis not present

## 2023-02-23 DIAGNOSIS — I129 Hypertensive chronic kidney disease with stage 1 through stage 4 chronic kidney disease, or unspecified chronic kidney disease: Secondary | ICD-10-CM | POA: Diagnosis not present

## 2023-02-23 DIAGNOSIS — R634 Abnormal weight loss: Secondary | ICD-10-CM | POA: Diagnosis not present

## 2023-02-23 DIAGNOSIS — I69351 Hemiplegia and hemiparesis following cerebral infarction affecting right dominant side: Secondary | ICD-10-CM | POA: Diagnosis not present

## 2023-04-05 DIAGNOSIS — M6281 Muscle weakness (generalized): Secondary | ICD-10-CM | POA: Diagnosis not present

## 2023-04-05 DIAGNOSIS — N1832 Chronic kidney disease, stage 3b: Secondary | ICD-10-CM | POA: Diagnosis not present

## 2023-04-05 DIAGNOSIS — Z8673 Personal history of transient ischemic attack (TIA), and cerebral infarction without residual deficits: Secondary | ICD-10-CM | POA: Diagnosis not present

## 2023-04-05 DIAGNOSIS — R278 Other lack of coordination: Secondary | ICD-10-CM | POA: Diagnosis not present

## 2023-04-05 DIAGNOSIS — I10 Essential (primary) hypertension: Secondary | ICD-10-CM | POA: Diagnosis not present

## 2023-04-12 DIAGNOSIS — I10 Essential (primary) hypertension: Secondary | ICD-10-CM | POA: Diagnosis not present

## 2023-04-12 DIAGNOSIS — Z8673 Personal history of transient ischemic attack (TIA), and cerebral infarction without residual deficits: Secondary | ICD-10-CM | POA: Diagnosis not present

## 2023-04-12 DIAGNOSIS — N1832 Chronic kidney disease, stage 3b: Secondary | ICD-10-CM | POA: Diagnosis not present

## 2023-04-12 DIAGNOSIS — M6281 Muscle weakness (generalized): Secondary | ICD-10-CM | POA: Diagnosis not present

## 2023-04-12 DIAGNOSIS — R278 Other lack of coordination: Secondary | ICD-10-CM | POA: Diagnosis not present

## 2023-04-17 DIAGNOSIS — R278 Other lack of coordination: Secondary | ICD-10-CM | POA: Diagnosis not present

## 2023-04-17 DIAGNOSIS — Z8673 Personal history of transient ischemic attack (TIA), and cerebral infarction without residual deficits: Secondary | ICD-10-CM | POA: Diagnosis not present

## 2023-04-17 DIAGNOSIS — M6281 Muscle weakness (generalized): Secondary | ICD-10-CM | POA: Diagnosis not present

## 2023-04-17 DIAGNOSIS — I10 Essential (primary) hypertension: Secondary | ICD-10-CM | POA: Diagnosis not present

## 2023-04-17 DIAGNOSIS — N1832 Chronic kidney disease, stage 3b: Secondary | ICD-10-CM | POA: Diagnosis not present

## 2023-04-19 ENCOUNTER — Encounter: Payer: Self-pay | Admitting: Neurology

## 2023-04-19 ENCOUNTER — Institutional Professional Consult (permissible substitution): Payer: Self-pay | Admitting: Neurology

## 2023-04-19 DIAGNOSIS — I10 Essential (primary) hypertension: Secondary | ICD-10-CM | POA: Diagnosis not present

## 2023-04-19 DIAGNOSIS — Z8673 Personal history of transient ischemic attack (TIA), and cerebral infarction without residual deficits: Secondary | ICD-10-CM | POA: Diagnosis not present

## 2023-04-19 DIAGNOSIS — N1832 Chronic kidney disease, stage 3b: Secondary | ICD-10-CM | POA: Diagnosis not present

## 2023-04-19 DIAGNOSIS — M6281 Muscle weakness (generalized): Secondary | ICD-10-CM | POA: Diagnosis not present

## 2023-04-19 DIAGNOSIS — R278 Other lack of coordination: Secondary | ICD-10-CM | POA: Diagnosis not present

## 2023-04-26 DIAGNOSIS — N1832 Chronic kidney disease, stage 3b: Secondary | ICD-10-CM | POA: Diagnosis not present

## 2023-04-26 DIAGNOSIS — Z8673 Personal history of transient ischemic attack (TIA), and cerebral infarction without residual deficits: Secondary | ICD-10-CM | POA: Diagnosis not present

## 2023-04-26 DIAGNOSIS — M6281 Muscle weakness (generalized): Secondary | ICD-10-CM | POA: Diagnosis not present

## 2023-04-26 DIAGNOSIS — R278 Other lack of coordination: Secondary | ICD-10-CM | POA: Diagnosis not present

## 2023-04-26 DIAGNOSIS — I10 Essential (primary) hypertension: Secondary | ICD-10-CM | POA: Diagnosis not present

## 2023-04-28 ENCOUNTER — Other Ambulatory Visit: Payer: Self-pay

## 2023-04-28 ENCOUNTER — Emergency Department (HOSPITAL_COMMUNITY): Payer: Medicare Other

## 2023-04-28 ENCOUNTER — Emergency Department (HOSPITAL_COMMUNITY)
Admission: EM | Admit: 2023-04-28 | Discharge: 2023-04-28 | Disposition: A | Discharge status: Home / Self Care | Payer: Medicare Other | Attending: Emergency Medicine | Admitting: Emergency Medicine

## 2023-04-28 ENCOUNTER — Encounter (HOSPITAL_COMMUNITY): Payer: Self-pay

## 2023-04-28 DIAGNOSIS — Z7982 Long term (current) use of aspirin: Secondary | ICD-10-CM | POA: Diagnosis not present

## 2023-04-28 DIAGNOSIS — Z743 Need for continuous supervision: Secondary | ICD-10-CM | POA: Diagnosis not present

## 2023-04-28 DIAGNOSIS — R410 Disorientation, unspecified: Secondary | ICD-10-CM | POA: Diagnosis not present

## 2023-04-28 DIAGNOSIS — Z79899 Other long term (current) drug therapy: Secondary | ICD-10-CM | POA: Insufficient documentation

## 2023-04-28 DIAGNOSIS — R0689 Other abnormalities of breathing: Secondary | ICD-10-CM | POA: Diagnosis not present

## 2023-04-28 DIAGNOSIS — N3000 Acute cystitis without hematuria: Secondary | ICD-10-CM | POA: Diagnosis not present

## 2023-04-28 DIAGNOSIS — Z8546 Personal history of malignant neoplasm of prostate: Secondary | ICD-10-CM | POA: Insufficient documentation

## 2023-04-28 DIAGNOSIS — I2510 Atherosclerotic heart disease of native coronary artery without angina pectoris: Secondary | ICD-10-CM | POA: Insufficient documentation

## 2023-04-28 DIAGNOSIS — I10 Essential (primary) hypertension: Secondary | ICD-10-CM | POA: Diagnosis not present

## 2023-04-28 DIAGNOSIS — J439 Emphysema, unspecified: Secondary | ICD-10-CM | POA: Diagnosis not present

## 2023-04-28 DIAGNOSIS — G9389 Other specified disorders of brain: Secondary | ICD-10-CM | POA: Diagnosis not present

## 2023-04-28 DIAGNOSIS — R531 Weakness: Secondary | ICD-10-CM | POA: Diagnosis not present

## 2023-04-28 DIAGNOSIS — R519 Headache, unspecified: Secondary | ICD-10-CM | POA: Diagnosis not present

## 2023-04-28 DIAGNOSIS — I700 Atherosclerosis of aorta: Secondary | ICD-10-CM | POA: Diagnosis not present

## 2023-04-28 DIAGNOSIS — R404 Transient alteration of awareness: Secondary | ICD-10-CM | POA: Diagnosis not present

## 2023-04-28 DIAGNOSIS — I499 Cardiac arrhythmia, unspecified: Secondary | ICD-10-CM | POA: Diagnosis not present

## 2023-04-28 LAB — RAPID URINE DRUG SCREEN, HOSP PERFORMED
Amphetamines: NOT DETECTED
Barbiturates: NOT DETECTED
Benzodiazepines: NOT DETECTED
Cocaine: NOT DETECTED
Opiates: NOT DETECTED
Tetrahydrocannabinol: NOT DETECTED

## 2023-04-28 LAB — CBC
HCT: 33.9 % — ABNORMAL LOW (ref 39.0–52.0)
Hemoglobin: 10.6 g/dL — ABNORMAL LOW (ref 13.0–17.0)
MCH: 31.8 pg (ref 26.0–34.0)
MCHC: 31.3 g/dL (ref 30.0–36.0)
MCV: 101.8 fL — ABNORMAL HIGH (ref 80.0–100.0)
Platelets: 172 10*3/uL (ref 150–400)
RBC: 3.33 MIL/uL — ABNORMAL LOW (ref 4.22–5.81)
RDW: 15.6 % — ABNORMAL HIGH (ref 11.5–15.5)
WBC: 8.6 10*3/uL (ref 4.0–10.5)
nRBC: 0 % (ref 0.0–0.2)

## 2023-04-28 LAB — PROTIME-INR
INR: 1 (ref 0.8–1.2)
Prothrombin Time: 13.6 s (ref 11.4–15.2)

## 2023-04-28 LAB — URINALYSIS, ROUTINE W REFLEX MICROSCOPIC
Bacteria, UA: NONE SEEN
Bilirubin Urine: NEGATIVE
Glucose, UA: NEGATIVE mg/dL
Ketones, ur: NEGATIVE mg/dL
Nitrite: POSITIVE — AB
Protein, ur: 100 mg/dL — AB
Specific Gravity, Urine: 1.013 (ref 1.005–1.030)
WBC, UA: >50 WBC/hpf (ref 0–5)
pH: 8 (ref 5.0–8.0)

## 2023-04-28 LAB — COMPREHENSIVE METABOLIC PANEL
ALT: 21 U/L (ref 0–44)
AST: 17 U/L (ref 15–41)
Albumin: 2.5 g/dL — ABNORMAL LOW (ref 3.5–5.0)
Alkaline Phosphatase: 46 U/L (ref 38–126)
Anion gap: 10 (ref 5–15)
BUN: 36 mg/dL — ABNORMAL HIGH (ref 8–23)
CO2: 26 mmol/L (ref 22–32)
Calcium: 8.7 mg/dL — ABNORMAL LOW (ref 8.9–10.3)
Chloride: 103 mmol/L (ref 98–111)
Creatinine, Ser: 1.73 mg/dL — ABNORMAL HIGH (ref 0.61–1.24)
GFR, Estimated: 37 mL/min — ABNORMAL LOW (ref >60)
Glucose, Bld: 94 mg/dL (ref 70–99)
Potassium: 4.7 mmol/L (ref 3.5–5.1)
Sodium: 139 mmol/L (ref 135–145)
Total Bilirubin: 1 mg/dL (ref <1.2)
Total Protein: 5.6 g/dL — ABNORMAL LOW (ref 6.5–8.1)

## 2023-04-28 LAB — DIFFERENTIAL
Abs Immature Granulocytes: 0.03 10*3/uL (ref 0.00–0.07)
Basophils Absolute: 0 10*3/uL (ref 0.0–0.1)
Basophils Relative: 0 %
Eosinophils Absolute: 0 10*3/uL (ref 0.0–0.5)
Eosinophils Relative: 0 %
Immature Granulocytes: 0 %
Lymphocytes Relative: 7 %
Lymphs Abs: 0.6 10*3/uL — ABNORMAL LOW (ref 0.7–4.0)
Monocytes Absolute: 0.5 10*3/uL (ref 0.1–1.0)
Monocytes Relative: 5 %
Neutro Abs: 7.4 10*3/uL (ref 1.7–7.7)
Neutrophils Relative %: 88 %

## 2023-04-28 LAB — ETHANOL: Alcohol, Ethyl (B): <10 mg/dL (ref <10)

## 2023-04-28 LAB — VALPROIC ACID LEVEL: Valproic Acid Lvl: 37 ug/mL — ABNORMAL LOW (ref 50.0–100.0)

## 2023-04-28 MED ORDER — SODIUM CHLORIDE 0.9 % IV SOLN
100.0000 mL/h | INTRAVENOUS | Status: DC
  Administered 2023-04-28: 100 mL/h via INTRAVENOUS

## 2023-04-28 MED ORDER — CEPHALEXIN 500 MG PO CAPS: 500.0000 mg | ORAL_CAPSULE | Freq: Three times a day (TID) | ORAL | 0 refills | Status: AC

## 2023-04-28 MED ORDER — SODIUM CHLORIDE 0.9 % IV SOLN
1.0000 g | Freq: Once | INTRAVENOUS | Status: AC
  Administered 2023-04-28: 1 g via INTRAVENOUS
  Filled 2023-04-28: qty 10

## 2023-04-28 MED ORDER — SODIUM CHLORIDE 0.9 % IV BOLUS
500.0000 mL | Freq: Once | INTRAVENOUS | Status: AC
  Administered 2023-04-28: 500 mL via INTRAVENOUS

## 2023-04-28 MED ORDER — FLUCONAZOLE 150 MG PO TABS
150.0000 mg | ORAL_TABLET | Freq: Once | ORAL | Status: AC
  Administered 2023-04-28: 150 mg via ORAL
  Filled 2023-04-28: qty 1

## 2023-04-28 NOTE — ED Triage Notes (Signed)
Pt arrived via [facility] from [facility], per EMS, [facility] staff stated pt was normal at 7 am this morning but was noted to be altered and unable to feed himself or hold his cup for breakfast this morning, generalized weakness. Per EMS, all extremities are equal in movement. A fib on the cardiac monitor. Also per EMS, BP has been WNL but starting to trend down, cbg 115. EDP at bedside

## 2023-04-28 NOTE — ED Provider Notes (Signed)
[place] EMERGENCY DEPARTMENT AT [facility] Provider Note   CSN: [id number] Arrival date & time: 04/28/23  4098     History  No chief complaint on file.   Adam Arellano is a 89 y.o. male.  Pt is a 89 yo male with pmhx significant for seizure d/o, htn, cad, prostate cancer, gerd, hld, pvd, cva, and MR s/p mitraClip implantation.  Pt is from a facility and he was reported as normal this am around 0700, but then they noticed that he could not feed himself or hold his cup for breakfast this am.  Pt is able to tell me his name, but is unable to give any hx or answer many questions.       Home Medications Prior to Admission medications   Medication Sig Start Date End Date Taking? Authorizing Provider  cephALEXin (KEFLEX) 500 MG capsule Take 1 capsule (500 mg total) by mouth 3 (three) times daily. 04/28/23  Yes Jacalyn Lefevre, MD  acetaminophen (TYLENOL) 325 MG tablet Take 1-2 tablets (325-650 mg total) by mouth every 4 (four) hours as needed for mild pain. 04/21/22   Setzer, Lynnell Jude, PA-C  amLODipine (NORVASC) 5 MG tablet Take 1 tablet (5 mg total) by mouth daily. 01/09/23   Amin, Ankit C, MD  aspirin EC 81 MG tablet Take 81 mg by mouth daily. Swallow whole.    [provider]  divalproex (DEPAKOTE) 250 MG DR tablet Take 3 tablets (750 mg total) by mouth every 12 (twelve) hours. 01/08/23   Amin, Ankit C, MD  docusate sodium (COLACE) 100 MG capsule Take 1 capsule (100 mg total) by mouth 2 (two) times daily. Patient not taking: Reported on 01/05/2023 04/21/22   Milinda Antis, PA-C  FIBER PO Take 1 capsule by mouth daily.    [provider]  furosemide (LASIX) 20 MG tablet Take 1 tablet (20 mg total) by mouth daily. 05/19/22   Jake Bathe, MD  gabapentin (NEURONTIN) 100 MG capsule Take 1 capsule (100 mg total) by mouth at bedtime. Patient not taking: Reported on 01/05/2023 04/21/22   Milinda Antis, PA-C  losartan (COZAAR) 25 MG tablet TAKE 1 TABLET  DAILY 01/16/22   Jake Bathe, MD  metoprolol tartrate (LOPRESSOR) 25 MG tablet Take 25 mg by mouth 2 (two) times daily. 10/24/13   Jake Bathe, MD  Multiple Vitamin (MULTIVITAMIN WITH MINERALS) TABS tablet Take 1 tablet by mouth daily.    [provider]  nitroGLYCERIN (NITROSTAT) 0.4 MG SL tablet PLACE 1 TABLET UNDER THE TONGUE AT ONSET OF CHEST PAIN EVERY 5 MINTUES UP TO 3 TIMES AS NEEDED Patient taking differently: Place 0.4 mg under the tongue every 5 (five) minutes as needed for chest pain. PLACE 1 TABLET UNDER THE TONGUE AT ONSET OF CHEST PAIN EVERY 5 MINTUES UP TO 3 TIMES AS NEEDED 08/03/21   Janetta Hora, PA-C  polyethylene glycol (MIRALAX / GLYCOLAX) 17 g packet Take 17 g by mouth daily as needed for mild constipation. Patient not taking: Reported on 01/05/2023 04/21/22   Milinda Antis, PA-C  Polyvinyl Alcohol-Povidone (REFRESH OP) Place 1 drop into both eyes daily as needed (dry eye).    [provider]      Allergies    Simvastatin, Atorvastatin, Lisinopril, and Rosuvastatin    Review of Systems   Review of Systems  Neurological:  Positive for weakness.  All other systems reviewed and are negative.   Physical Exam Updated Vital Signs BP  100/63 (BP Location: Left Arm)   Pulse 66   Temp 98.4 F (36.9 C) (Oral)   Resp 16   Ht 5\' 8"  (1.727 m)   Wt 69.6 kg   SpO2 96%   BMI 23.33 kg/m  Physical Exam Vitals and nursing note reviewed.  Constitutional:      Appearance: Normal appearance.  HENT:     Head: Normocephalic and atraumatic.     Right Ear: External ear normal.     Left Ear: External ear normal.     Nose: Nose normal.     Mouth/Throat:     Mouth: Mucous membranes are moist.     Pharynx: Oropharynx is clear.  Eyes:     Extraocular Movements: Extraocular movements intact.     Conjunctiva/sclera: Conjunctivae normal.     Pupils: Pupils are equal, round, and reactive to light.  Cardiovascular:     Rate and Rhythm: Normal rate and  regular rhythm.     Pulses: Normal pulses.     Heart sounds: Normal heart sounds.  Pulmonary:     Effort: Pulmonary effort is normal.     Breath sounds: Normal breath sounds.  Abdominal:     General: Abdomen is flat. Bowel sounds are normal.     Palpations: Abdomen is soft.  Musculoskeletal:        General: Normal range of motion.     Cervical back: Normal range of motion and neck supple.  Skin:    General: Skin is warm.     Capillary Refill: Capillary refill takes less than 2 seconds.  Neurological:     General: No focal deficit present.     Mental Status: He is alert. He is confused.     Comments: Pt is moving all 4 extremities.  He will only follow occasional commands.  He will tell me his name, but won't answer other orientation questions.   Psychiatric:        Mood and Affect: Mood normal.        Behavior: Behavior normal.     ED Results / Procedures / Treatments   Labs (all labs ordered are listed, but only abnormal results are displayed) Labs Reviewed  CBC - Abnormal; Notable for the following components:      Result Value   RBC 3.33 (*)    Hemoglobin 10.6 (*)    HCT 33.9 (*)    MCV 101.8 (*)    RDW 15.6 (*)    All other components within normal limits  DIFFERENTIAL - Abnormal; Notable for the following components:   Lymphs Abs 0.6 (*)    All other components within normal limits  COMPREHENSIVE METABOLIC PANEL - Abnormal; Notable for the following components:   BUN 36 (*)    Creatinine, Ser 1.73 (*)    Calcium 8.7 (*)    Total Protein 5.6 (*)    Albumin 2.5 (*)    GFR, Estimated 37 (*)    All other components within normal limits  URINALYSIS, ROUTINE W REFLEX MICROSCOPIC - Abnormal; Notable for the following components:   APPearance CLOUDY (*)    Hgb urine dipstick SMALL (*)    Protein, ur 100 (*)    Nitrite POSITIVE (*)    Leukocytes,Ua LARGE (*)    All other components within normal limits  VALPROIC ACID LEVEL - Abnormal; Notable for the following  components:   Valproic Acid Lvl 37 (*)    All other components within normal limits  ETHANOL  PROTIME-INR  RAPID URINE  DRUG SCREEN, HOSP PERFORMED    EKG None  Radiology MR BRAIN WO CONTRAST  Result Date: 04/28/2023 CLINICAL DATA:  89 year old male with headache. EXAM: MRI HEAD WITHOUT CONTRAST TECHNIQUE: Multiplanar, multiecho pulse sequences of the brain and surrounding structures were obtained without intravenous contrast. COMPARISON:  Head CT 0933 hours today.  Brain MRI 01/05/2023. FINDINGS: Brain: Chronic left occipital lobe encephalomalacia and ex vacuo enlargement of the left occipital horn. Stable cerebral volume since April. Nonspecific additional lateral and 3rd ventriculomegaly. No restricted diffusion to suggest acute infarction. No midline shift, mass effect, evidence of mass lesion, acute ventriculomegaly, extra-axial collection or acute intracranial hemorrhage. Cervicomedullary junction and pituitary are within normal limits. Confluent periventricular white matter T2 and FLAIR hyperintensity. No chronic cerebral blood products. Chronic lacunar infarct left posterolateral thalamus (series 14, image 75). Stable gray and white matter signal throughout the brain. Vascular: Major intracranial vascular flow voids are preserved. Skull and upper cervical spine: Partially visible cervical spine degeneration with multilevel spondylolisthesis. Normal background bone marrow signal. Sinuses/Orbits: Postoperative changes to both globes. Paranasal sinuses and mastoids are stable and well aerated. Other: Negative visible internal auditory structures. Negative visible scalp and face. IMPRESSION: 1. No acute intracranial abnormality. 2. Chronic left PCA infarct, chronic small vessel disease. Chronic ventriculomegaly. Electronically Signed   By: Odessa Fleming M.D.   On: 04/28/2023 12:36   DG Chest Portable 1 View  Result Date: 04/28/2023 CLINICAL DATA:  Altered mental status. EXAM: PORTABLE CHEST 1 VIEW  COMPARISON:  04/02/2022 FINDINGS: Heart size and mediastinal contours are normal. Aortic atherosclerotic calcifications. There is no pleural effusion, airspace consolidation or atelectasis. Lungs appear hyperexpanded with mild diffuse coarsened interstitial markings, stable from previous study. The visualized osseous structures appear intact. IMPRESSION: 1. No acute cardiopulmonary abnormalities. 2. Emphysema 3. Aortic Atherosclerosis (ICD10-I70.0). Electronically Signed   By: Signa Kell M.D.   On: 04/28/2023 09:55   CT HEAD WO CONTRAST  Result Date: 04/28/2023 CLINICAL DATA:  Headache. EXAM: CT HEAD WITHOUT CONTRAST TECHNIQUE: Contiguous axial images were obtained from the base of the skull through the vertex without intravenous contrast. RADIATION DOSE REDUCTION: This exam was performed according to the departmental dose-optimization program which includes automated exposure control, adjustment of the mA and/or kV according to patient size and/or use of iterative reconstruction technique. COMPARISON:  01/05/2023 FINDINGS: Brain: No evidence of acute infarction, hemorrhage, hydrocephalus, extra-axial collection or mass lesion/mass effect. Remote left PCA territory infarct. Remote infarct involving the posterior limb of the left in sternal capsule of the basal ganglia. Cerebral atrophy with ex vacuo dilatation of the ventricles. There is mild diffuse low-attenuation within the subcortical and periventricular white matter compatible with chronic microvascular disease. Vascular: No hyperdense vessel or unexpected calcification. Skull: Normal. Negative for fracture or focal lesion. Sinuses/Orbits: No acute finding. Other: None. IMPRESSION: 1. No acute intracranial abnormalities. 2. Remote left PCA territory infarct. 3. Chronic microvascular disease and cerebral atrophy. Electronically Signed   By: Signa Kell M.D.   On: 04/28/2023 09:46    Procedures Procedures    Medications Ordered in  ED Medications  sodium chloride 0.9 % bolus 500 mL (500 mLs Intravenous New Bag/Given 04/28/23 0947)    Followed by  0.9 %  sodium chloride infusion (100 mL/hr Intravenous New Bag/Given 04/28/23 0948)  cefTRIAXone (ROCEPHIN) 1 g in sodium chloride 0.9 % 100 mL IVPB (has no administration in time range)  fluconazole (DIFLUCAN) tablet 150 mg (150 mg Oral Given 04/28/23 1229)    ED Course/ Medical Decision Making/  A&P Clinical Course as of 04/28/23 1246  Sat Apr 28, 2023  1241 MR BRAIN WO CONTRAST [JH]    Clinical Course User Index [JH] Jacalyn Lefevre, MD                                 Medical Decision Making Amount and/or Complexity of Data Reviewed Labs: ordered. Radiology: ordered. Decision-making details documented in ED Course.  Risk Prescription drug management.   This patient presents to the ED for concern of AMS, this involves an extensive number of treatment options, and is a complaint that carries with it a high risk of complications and morbidity.  The differential diagnosis includes CVA, head bleed, infection, electrolyte abn   Co morbidities that complicate the patient evaluation  seizure d/o, htn, cad, prostate cancer, gerd, hld, pvd, cva, and MR s/p mitraClip implantation   Additional history obtained:  Additional history obtained from epic chart review External records from outside source obtained and reviewed including EMS report   Lab Tests:  I Ordered, and personally interpreted labs.  The pertinent results include:  uds neg, ua +positive, lg le, + yeast, cbc with hgb 10.6 (hgb 11.2 in August), cmp with cr 1.73 (stable); etoh neg, inr 1.0, depakote low at 37   Imaging Studies ordered:  I ordered imaging studies including ct head and cxr and MRI I independently visualized and interpreted imaging which showed  CT head: No acute intracranial abnormalities.  2. Remote left PCA territory infarct.  3. Chronic microvascular disease and cerebral  atrophy.  CXR: No acute cardiopulmonary abnormalities.  2. Emphysema  3. Aortic Atherosclerosis (ICD10-I70.0).  MRI:  No acute intracranial abnormality.  2. Chronic left PCA infarct, chronic small vessel disease. Chronic  ventriculomegaly.   I agree with the radiologist interpretation   Cardiac Monitoring:  The patient was maintained on a cardiac monitor.  I personally viewed and interpreted the cardiac monitored which showed an underlying rhythm of: nsr   Medicines ordered and prescription drug management:  I ordered medication including rocephin  for sx  Reevaluation of the patient after these medicines showed that the patient improved I have reviewed the patients home medicines and have made adjustments as needed   Test Considered:  Ct/mri   Critical Interventions:  mri   Problem List / ED Course:  AMS:  improved now and back to baseline per daughter.  MRI neg.  He could possibly have had a seizure, but no one witnessed any seizure activity.  He does have a uti, so he will be treated for that.  He is stable for d/c.  F/u with pcp.    Reevaluation:  After the interventions noted above, I reevaluated the patient and found that they have :improved   Social Determinants of Health:  Lives in a facility   Dispostion:  After consideration of the diagnostic results and the patients response to treatment, I feel that the patent would benefit from .          Final Clinical Impression(s) / ED Diagnoses Final diagnoses:  Acute cystitis without hematuria    Rx / DC Orders ED Discharge Orders          Ordered    cephALEXin (KEFLEX) 500 MG capsule  3 times daily        04/28/23 1246              Jacalyn Lefevre, MD 04/28/23 1246

## 2023-04-28 NOTE — ED Notes (Signed)
Patient transported to MRI 

## 2023-04-28 NOTE — ED Notes (Addendum)
Spoke with Editor, commissioning at [employer] updates given on medical condition and discharge planning.

## 2023-04-28 NOTE — ED Notes (Signed)
Patient  alert and oriented x 2 disoriented  to time. Patient's daughter  states at baseline he is confused unable to perform NIH neurological assessment.

## 2023-04-29 DIAGNOSIS — N3000 Acute cystitis without hematuria: Secondary | ICD-10-CM | POA: Diagnosis not present

## 2023-04-30 DIAGNOSIS — R279 Unspecified lack of coordination: Secondary | ICD-10-CM | POA: Diagnosis not present

## 2023-04-30 DIAGNOSIS — I10 Essential (primary) hypertension: Secondary | ICD-10-CM | POA: Diagnosis not present

## 2023-04-30 DIAGNOSIS — N183 Chronic kidney disease, stage 3 unspecified: Secondary | ICD-10-CM | POA: Diagnosis not present

## 2023-04-30 DIAGNOSIS — M6281 Muscle weakness (generalized): Secondary | ICD-10-CM | POA: Diagnosis not present

## 2023-04-30 DIAGNOSIS — Z8673 Personal history of transient ischemic attack (TIA), and cerebral infarction without residual deficits: Secondary | ICD-10-CM | POA: Diagnosis not present

## 2023-05-01 DIAGNOSIS — I10 Essential (primary) hypertension: Secondary | ICD-10-CM | POA: Diagnosis not present

## 2023-05-01 DIAGNOSIS — M6281 Muscle weakness (generalized): Secondary | ICD-10-CM | POA: Diagnosis not present

## 2023-05-01 DIAGNOSIS — N1832 Chronic kidney disease, stage 3b: Secondary | ICD-10-CM | POA: Diagnosis not present

## 2023-05-01 DIAGNOSIS — Z8673 Personal history of transient ischemic attack (TIA), and cerebral infarction without residual deficits: Secondary | ICD-10-CM | POA: Diagnosis not present

## 2023-05-01 DIAGNOSIS — R278 Other lack of coordination: Secondary | ICD-10-CM | POA: Diagnosis not present

## 2023-05-02 DIAGNOSIS — Z8673 Personal history of transient ischemic attack (TIA), and cerebral infarction without residual deficits: Secondary | ICD-10-CM | POA: Diagnosis not present

## 2023-05-02 DIAGNOSIS — R279 Unspecified lack of coordination: Secondary | ICD-10-CM | POA: Diagnosis not present

## 2023-05-02 DIAGNOSIS — M6281 Muscle weakness (generalized): Secondary | ICD-10-CM | POA: Diagnosis not present

## 2023-05-02 DIAGNOSIS — N183 Chronic kidney disease, stage 3 unspecified: Secondary | ICD-10-CM | POA: Diagnosis not present

## 2023-05-02 DIAGNOSIS — I10 Essential (primary) hypertension: Secondary | ICD-10-CM | POA: Diagnosis not present

## 2023-05-03 DIAGNOSIS — Z8673 Personal history of transient ischemic attack (TIA), and cerebral infarction without residual deficits: Secondary | ICD-10-CM | POA: Diagnosis not present

## 2023-05-03 DIAGNOSIS — I10 Essential (primary) hypertension: Secondary | ICD-10-CM | POA: Diagnosis not present

## 2023-05-03 DIAGNOSIS — M6281 Muscle weakness (generalized): Secondary | ICD-10-CM | POA: Diagnosis not present

## 2023-05-03 DIAGNOSIS — R278 Other lack of coordination: Secondary | ICD-10-CM | POA: Diagnosis not present

## 2023-05-03 DIAGNOSIS — N1832 Chronic kidney disease, stage 3b: Secondary | ICD-10-CM | POA: Diagnosis not present

## 2023-05-07 DIAGNOSIS — Z8673 Personal history of transient ischemic attack (TIA), and cerebral infarction without residual deficits: Secondary | ICD-10-CM | POA: Diagnosis not present

## 2023-05-07 DIAGNOSIS — R279 Unspecified lack of coordination: Secondary | ICD-10-CM | POA: Diagnosis not present

## 2023-05-07 DIAGNOSIS — M6281 Muscle weakness (generalized): Secondary | ICD-10-CM | POA: Diagnosis not present

## 2023-05-07 DIAGNOSIS — I10 Essential (primary) hypertension: Secondary | ICD-10-CM | POA: Diagnosis not present

## 2023-05-07 DIAGNOSIS — N183 Chronic kidney disease, stage 3 unspecified: Secondary | ICD-10-CM | POA: Diagnosis not present

## 2023-05-09 DIAGNOSIS — M6281 Muscle weakness (generalized): Secondary | ICD-10-CM | POA: Diagnosis not present

## 2023-05-09 DIAGNOSIS — I10 Essential (primary) hypertension: Secondary | ICD-10-CM | POA: Diagnosis not present

## 2023-05-09 DIAGNOSIS — R279 Unspecified lack of coordination: Secondary | ICD-10-CM | POA: Diagnosis not present

## 2023-05-09 DIAGNOSIS — N183 Chronic kidney disease, stage 3 unspecified: Secondary | ICD-10-CM | POA: Diagnosis not present

## 2023-05-09 DIAGNOSIS — Z8673 Personal history of transient ischemic attack (TIA), and cerebral infarction without residual deficits: Secondary | ICD-10-CM | POA: Diagnosis not present

## 2023-05-14 DIAGNOSIS — M6281 Muscle weakness (generalized): Secondary | ICD-10-CM | POA: Diagnosis not present

## 2023-05-14 DIAGNOSIS — I10 Essential (primary) hypertension: Secondary | ICD-10-CM | POA: Diagnosis not present

## 2023-05-14 DIAGNOSIS — R279 Unspecified lack of coordination: Secondary | ICD-10-CM | POA: Diagnosis not present

## 2023-05-14 DIAGNOSIS — Z8673 Personal history of transient ischemic attack (TIA), and cerebral infarction without residual deficits: Secondary | ICD-10-CM | POA: Diagnosis not present

## 2023-05-14 DIAGNOSIS — N183 Chronic kidney disease, stage 3 unspecified: Secondary | ICD-10-CM | POA: Diagnosis not present

## 2023-05-15 DIAGNOSIS — M6281 Muscle weakness (generalized): Secondary | ICD-10-CM | POA: Diagnosis not present

## 2023-05-15 DIAGNOSIS — I10 Essential (primary) hypertension: Secondary | ICD-10-CM | POA: Diagnosis not present

## 2023-05-15 DIAGNOSIS — R278 Other lack of coordination: Secondary | ICD-10-CM | POA: Diagnosis not present

## 2023-05-15 DIAGNOSIS — Z8673 Personal history of transient ischemic attack (TIA), and cerebral infarction without residual deficits: Secondary | ICD-10-CM | POA: Diagnosis not present

## 2023-05-15 DIAGNOSIS — N1832 Chronic kidney disease, stage 3b: Secondary | ICD-10-CM | POA: Diagnosis not present

## 2023-05-16 DIAGNOSIS — I10 Essential (primary) hypertension: Secondary | ICD-10-CM | POA: Diagnosis not present

## 2023-05-16 DIAGNOSIS — Z8673 Personal history of transient ischemic attack (TIA), and cerebral infarction without residual deficits: Secondary | ICD-10-CM | POA: Diagnosis not present

## 2023-05-16 DIAGNOSIS — N183 Chronic kidney disease, stage 3 unspecified: Secondary | ICD-10-CM | POA: Diagnosis not present

## 2023-05-16 DIAGNOSIS — R279 Unspecified lack of coordination: Secondary | ICD-10-CM | POA: Diagnosis not present

## 2023-05-16 DIAGNOSIS — M6281 Muscle weakness (generalized): Secondary | ICD-10-CM | POA: Diagnosis not present

## 2023-05-17 DIAGNOSIS — N1832 Chronic kidney disease, stage 3b: Secondary | ICD-10-CM | POA: Diagnosis not present

## 2023-05-17 DIAGNOSIS — M6281 Muscle weakness (generalized): Secondary | ICD-10-CM | POA: Diagnosis not present

## 2023-05-17 DIAGNOSIS — I10 Essential (primary) hypertension: Secondary | ICD-10-CM | POA: Diagnosis not present

## 2023-05-17 DIAGNOSIS — Z8673 Personal history of transient ischemic attack (TIA), and cerebral infarction without residual deficits: Secondary | ICD-10-CM | POA: Diagnosis not present

## 2023-05-17 DIAGNOSIS — R278 Other lack of coordination: Secondary | ICD-10-CM | POA: Diagnosis not present

## 2023-05-21 DIAGNOSIS — R279 Unspecified lack of coordination: Secondary | ICD-10-CM | POA: Diagnosis not present

## 2023-05-21 DIAGNOSIS — N183 Chronic kidney disease, stage 3 unspecified: Secondary | ICD-10-CM | POA: Diagnosis not present

## 2023-05-21 DIAGNOSIS — M6281 Muscle weakness (generalized): Secondary | ICD-10-CM | POA: Diagnosis not present

## 2023-05-21 DIAGNOSIS — I10 Essential (primary) hypertension: Secondary | ICD-10-CM | POA: Diagnosis not present

## 2023-05-21 DIAGNOSIS — Z8673 Personal history of transient ischemic attack (TIA), and cerebral infarction without residual deficits: Secondary | ICD-10-CM | POA: Diagnosis not present

## 2023-05-22 DIAGNOSIS — I10 Essential (primary) hypertension: Secondary | ICD-10-CM | POA: Diagnosis not present

## 2023-05-22 DIAGNOSIS — R278 Other lack of coordination: Secondary | ICD-10-CM | POA: Diagnosis not present

## 2023-05-22 DIAGNOSIS — Z8673 Personal history of transient ischemic attack (TIA), and cerebral infarction without residual deficits: Secondary | ICD-10-CM | POA: Diagnosis not present

## 2023-05-22 DIAGNOSIS — M6281 Muscle weakness (generalized): Secondary | ICD-10-CM | POA: Diagnosis not present

## 2023-05-22 DIAGNOSIS — N1832 Chronic kidney disease, stage 3b: Secondary | ICD-10-CM | POA: Diagnosis not present

## 2023-05-23 DIAGNOSIS — R279 Unspecified lack of coordination: Secondary | ICD-10-CM | POA: Diagnosis not present

## 2023-05-23 DIAGNOSIS — M6281 Muscle weakness (generalized): Secondary | ICD-10-CM | POA: Diagnosis not present

## 2023-05-23 DIAGNOSIS — Z8673 Personal history of transient ischemic attack (TIA), and cerebral infarction without residual deficits: Secondary | ICD-10-CM | POA: Diagnosis not present

## 2023-05-23 DIAGNOSIS — I10 Essential (primary) hypertension: Secondary | ICD-10-CM | POA: Diagnosis not present

## 2023-05-23 DIAGNOSIS — N183 Chronic kidney disease, stage 3 unspecified: Secondary | ICD-10-CM | POA: Diagnosis not present

## 2023-05-24 DIAGNOSIS — M6281 Muscle weakness (generalized): Secondary | ICD-10-CM | POA: Diagnosis not present

## 2023-05-24 DIAGNOSIS — N1832 Chronic kidney disease, stage 3b: Secondary | ICD-10-CM | POA: Diagnosis not present

## 2023-05-24 DIAGNOSIS — I10 Essential (primary) hypertension: Secondary | ICD-10-CM | POA: Diagnosis not present

## 2023-05-24 DIAGNOSIS — Z8673 Personal history of transient ischemic attack (TIA), and cerebral infarction without residual deficits: Secondary | ICD-10-CM | POA: Diagnosis not present

## 2023-05-24 DIAGNOSIS — R278 Other lack of coordination: Secondary | ICD-10-CM | POA: Diagnosis not present

## 2023-05-28 DIAGNOSIS — R279 Unspecified lack of coordination: Secondary | ICD-10-CM | POA: Diagnosis not present

## 2023-05-28 DIAGNOSIS — Z8673 Personal history of transient ischemic attack (TIA), and cerebral infarction without residual deficits: Secondary | ICD-10-CM | POA: Diagnosis not present

## 2023-05-28 DIAGNOSIS — I10 Essential (primary) hypertension: Secondary | ICD-10-CM | POA: Diagnosis not present

## 2023-05-28 DIAGNOSIS — M6281 Muscle weakness (generalized): Secondary | ICD-10-CM | POA: Diagnosis not present

## 2023-05-28 DIAGNOSIS — N183 Chronic kidney disease, stage 3 unspecified: Secondary | ICD-10-CM | POA: Diagnosis not present

## 2023-05-29 DIAGNOSIS — N1832 Chronic kidney disease, stage 3b: Secondary | ICD-10-CM | POA: Diagnosis not present

## 2023-05-29 DIAGNOSIS — Z8673 Personal history of transient ischemic attack (TIA), and cerebral infarction without residual deficits: Secondary | ICD-10-CM | POA: Diagnosis not present

## 2023-05-29 DIAGNOSIS — M6281 Muscle weakness (generalized): Secondary | ICD-10-CM | POA: Diagnosis not present

## 2023-05-29 DIAGNOSIS — R278 Other lack of coordination: Secondary | ICD-10-CM | POA: Diagnosis not present

## 2023-05-29 DIAGNOSIS — I10 Essential (primary) hypertension: Secondary | ICD-10-CM | POA: Diagnosis not present

## 2023-06-04 DIAGNOSIS — R279 Unspecified lack of coordination: Secondary | ICD-10-CM | POA: Diagnosis not present

## 2023-06-04 DIAGNOSIS — I10 Essential (primary) hypertension: Secondary | ICD-10-CM | POA: Diagnosis not present

## 2023-06-04 DIAGNOSIS — Z8673 Personal history of transient ischemic attack (TIA), and cerebral infarction without residual deficits: Secondary | ICD-10-CM | POA: Diagnosis not present

## 2023-06-04 DIAGNOSIS — M6281 Muscle weakness (generalized): Secondary | ICD-10-CM | POA: Diagnosis not present

## 2023-06-04 DIAGNOSIS — N1832 Chronic kidney disease, stage 3b: Secondary | ICD-10-CM | POA: Diagnosis not present

## 2023-06-05 DIAGNOSIS — R278 Other lack of coordination: Secondary | ICD-10-CM | POA: Diagnosis not present

## 2023-06-05 DIAGNOSIS — I10 Essential (primary) hypertension: Secondary | ICD-10-CM | POA: Diagnosis not present

## 2023-06-05 DIAGNOSIS — Z8673 Personal history of transient ischemic attack (TIA), and cerebral infarction without residual deficits: Secondary | ICD-10-CM | POA: Diagnosis not present

## 2023-06-05 DIAGNOSIS — M6281 Muscle weakness (generalized): Secondary | ICD-10-CM | POA: Diagnosis not present

## 2023-06-05 DIAGNOSIS — N1832 Chronic kidney disease, stage 3b: Secondary | ICD-10-CM | POA: Diagnosis not present

## 2023-06-07 DIAGNOSIS — M6281 Muscle weakness (generalized): Secondary | ICD-10-CM | POA: Diagnosis not present

## 2023-06-07 DIAGNOSIS — I10 Essential (primary) hypertension: Secondary | ICD-10-CM | POA: Diagnosis not present

## 2023-06-07 DIAGNOSIS — Z8673 Personal history of transient ischemic attack (TIA), and cerebral infarction without residual deficits: Secondary | ICD-10-CM | POA: Diagnosis not present

## 2023-06-07 DIAGNOSIS — R278 Other lack of coordination: Secondary | ICD-10-CM | POA: Diagnosis not present

## 2023-06-07 DIAGNOSIS — N1832 Chronic kidney disease, stage 3b: Secondary | ICD-10-CM | POA: Diagnosis not present

## 2023-06-11 DIAGNOSIS — N1832 Chronic kidney disease, stage 3b: Secondary | ICD-10-CM | POA: Diagnosis not present

## 2023-06-11 DIAGNOSIS — I10 Essential (primary) hypertension: Secondary | ICD-10-CM | POA: Diagnosis not present

## 2023-06-11 DIAGNOSIS — M6281 Muscle weakness (generalized): Secondary | ICD-10-CM | POA: Diagnosis not present

## 2023-06-11 DIAGNOSIS — Z8673 Personal history of transient ischemic attack (TIA), and cerebral infarction without residual deficits: Secondary | ICD-10-CM | POA: Diagnosis not present

## 2023-06-11 DIAGNOSIS — R279 Unspecified lack of coordination: Secondary | ICD-10-CM | POA: Diagnosis not present

## 2023-06-13 DIAGNOSIS — R279 Unspecified lack of coordination: Secondary | ICD-10-CM | POA: Diagnosis not present

## 2023-06-13 DIAGNOSIS — M6281 Muscle weakness (generalized): Secondary | ICD-10-CM | POA: Diagnosis not present

## 2023-06-13 DIAGNOSIS — I10 Essential (primary) hypertension: Secondary | ICD-10-CM | POA: Diagnosis not present

## 2023-06-13 DIAGNOSIS — N183 Chronic kidney disease, stage 3 unspecified: Secondary | ICD-10-CM | POA: Diagnosis not present

## 2023-06-13 DIAGNOSIS — Z8673 Personal history of transient ischemic attack (TIA), and cerebral infarction without residual deficits: Secondary | ICD-10-CM | POA: Diagnosis not present

## 2023-06-14 DIAGNOSIS — R278 Other lack of coordination: Secondary | ICD-10-CM | POA: Diagnosis not present

## 2023-06-14 DIAGNOSIS — N1832 Chronic kidney disease, stage 3b: Secondary | ICD-10-CM | POA: Diagnosis not present

## 2023-06-14 DIAGNOSIS — Z8673 Personal history of transient ischemic attack (TIA), and cerebral infarction without residual deficits: Secondary | ICD-10-CM | POA: Diagnosis not present

## 2023-06-14 DIAGNOSIS — M6281 Muscle weakness (generalized): Secondary | ICD-10-CM | POA: Diagnosis not present

## 2023-06-14 DIAGNOSIS — I10 Essential (primary) hypertension: Secondary | ICD-10-CM | POA: Diagnosis not present

## 2023-06-18 DIAGNOSIS — Z8673 Personal history of transient ischemic attack (TIA), and cerebral infarction without residual deficits: Secondary | ICD-10-CM | POA: Diagnosis not present

## 2023-06-18 DIAGNOSIS — N183 Chronic kidney disease, stage 3 unspecified: Secondary | ICD-10-CM | POA: Diagnosis not present

## 2023-06-18 DIAGNOSIS — M6281 Muscle weakness (generalized): Secondary | ICD-10-CM | POA: Diagnosis not present

## 2023-06-18 DIAGNOSIS — I10 Essential (primary) hypertension: Secondary | ICD-10-CM | POA: Diagnosis not present

## 2023-06-18 DIAGNOSIS — R279 Unspecified lack of coordination: Secondary | ICD-10-CM | POA: Diagnosis not present

## 2023-06-21 DIAGNOSIS — N1832 Chronic kidney disease, stage 3b: Secondary | ICD-10-CM | POA: Diagnosis not present

## 2023-06-21 DIAGNOSIS — I10 Essential (primary) hypertension: Secondary | ICD-10-CM | POA: Diagnosis not present

## 2023-06-21 DIAGNOSIS — Z8673 Personal history of transient ischemic attack (TIA), and cerebral infarction without residual deficits: Secondary | ICD-10-CM | POA: Diagnosis not present

## 2023-06-21 DIAGNOSIS — R278 Other lack of coordination: Secondary | ICD-10-CM | POA: Diagnosis not present

## 2023-06-21 DIAGNOSIS — M6281 Muscle weakness (generalized): Secondary | ICD-10-CM | POA: Diagnosis not present

## 2023-06-25 DIAGNOSIS — Z8673 Personal history of transient ischemic attack (TIA), and cerebral infarction without residual deficits: Secondary | ICD-10-CM | POA: Diagnosis not present

## 2023-06-25 DIAGNOSIS — R279 Unspecified lack of coordination: Secondary | ICD-10-CM | POA: Diagnosis not present

## 2023-06-25 DIAGNOSIS — N183 Chronic kidney disease, stage 3 unspecified: Secondary | ICD-10-CM | POA: Diagnosis not present

## 2023-06-25 DIAGNOSIS — M6281 Muscle weakness (generalized): Secondary | ICD-10-CM | POA: Diagnosis not present

## 2023-06-25 DIAGNOSIS — I10 Essential (primary) hypertension: Secondary | ICD-10-CM | POA: Diagnosis not present

## 2023-06-26 DIAGNOSIS — M6281 Muscle weakness (generalized): Secondary | ICD-10-CM | POA: Diagnosis not present

## 2023-06-26 DIAGNOSIS — I10 Essential (primary) hypertension: Secondary | ICD-10-CM | POA: Diagnosis not present

## 2023-06-26 DIAGNOSIS — R278 Other lack of coordination: Secondary | ICD-10-CM | POA: Diagnosis not present

## 2023-06-26 DIAGNOSIS — Z8673 Personal history of transient ischemic attack (TIA), and cerebral infarction without residual deficits: Secondary | ICD-10-CM | POA: Diagnosis not present

## 2023-06-26 DIAGNOSIS — N1832 Chronic kidney disease, stage 3b: Secondary | ICD-10-CM | POA: Diagnosis not present

## 2023-06-27 DIAGNOSIS — R279 Unspecified lack of coordination: Secondary | ICD-10-CM | POA: Diagnosis not present

## 2023-06-27 DIAGNOSIS — R278 Other lack of coordination: Secondary | ICD-10-CM | POA: Diagnosis not present

## 2023-06-27 DIAGNOSIS — I10 Essential (primary) hypertension: Secondary | ICD-10-CM | POA: Diagnosis not present

## 2023-06-27 DIAGNOSIS — N183 Chronic kidney disease, stage 3 unspecified: Secondary | ICD-10-CM | POA: Diagnosis not present

## 2023-06-27 DIAGNOSIS — M6281 Muscle weakness (generalized): Secondary | ICD-10-CM | POA: Diagnosis not present

## 2023-06-27 DIAGNOSIS — N1832 Chronic kidney disease, stage 3b: Secondary | ICD-10-CM | POA: Diagnosis not present

## 2023-06-27 DIAGNOSIS — Z8673 Personal history of transient ischemic attack (TIA), and cerebral infarction without residual deficits: Secondary | ICD-10-CM | POA: Diagnosis not present

## 2023-06-28 DIAGNOSIS — N1832 Chronic kidney disease, stage 3b: Secondary | ICD-10-CM | POA: Diagnosis not present

## 2023-06-28 DIAGNOSIS — M6281 Muscle weakness (generalized): Secondary | ICD-10-CM | POA: Diagnosis not present

## 2023-06-28 DIAGNOSIS — I10 Essential (primary) hypertension: Secondary | ICD-10-CM | POA: Diagnosis not present

## 2023-06-28 DIAGNOSIS — Z8673 Personal history of transient ischemic attack (TIA), and cerebral infarction without residual deficits: Secondary | ICD-10-CM | POA: Diagnosis not present

## 2023-06-28 DIAGNOSIS — R278 Other lack of coordination: Secondary | ICD-10-CM | POA: Diagnosis not present

## 2023-07-02 DIAGNOSIS — I10 Essential (primary) hypertension: Secondary | ICD-10-CM | POA: Diagnosis not present

## 2023-07-02 DIAGNOSIS — N183 Chronic kidney disease, stage 3 unspecified: Secondary | ICD-10-CM | POA: Diagnosis not present

## 2023-07-02 DIAGNOSIS — M6281 Muscle weakness (generalized): Secondary | ICD-10-CM | POA: Diagnosis not present

## 2023-07-02 DIAGNOSIS — R279 Unspecified lack of coordination: Secondary | ICD-10-CM | POA: Diagnosis not present

## 2023-07-02 DIAGNOSIS — Z8673 Personal history of transient ischemic attack (TIA), and cerebral infarction without residual deficits: Secondary | ICD-10-CM | POA: Diagnosis not present

## 2023-07-03 DIAGNOSIS — I10 Essential (primary) hypertension: Secondary | ICD-10-CM | POA: Diagnosis not present

## 2023-07-03 DIAGNOSIS — Z8673 Personal history of transient ischemic attack (TIA), and cerebral infarction without residual deficits: Secondary | ICD-10-CM | POA: Diagnosis not present

## 2023-07-03 DIAGNOSIS — N1832 Chronic kidney disease, stage 3b: Secondary | ICD-10-CM | POA: Diagnosis not present

## 2023-07-03 DIAGNOSIS — M6281 Muscle weakness (generalized): Secondary | ICD-10-CM | POA: Diagnosis not present

## 2023-07-03 DIAGNOSIS — R278 Other lack of coordination: Secondary | ICD-10-CM | POA: Diagnosis not present

## 2023-07-04 DIAGNOSIS — I10 Essential (primary) hypertension: Secondary | ICD-10-CM | POA: Diagnosis not present

## 2023-07-04 DIAGNOSIS — N183 Chronic kidney disease, stage 3 unspecified: Secondary | ICD-10-CM | POA: Diagnosis not present

## 2023-07-04 DIAGNOSIS — R279 Unspecified lack of coordination: Secondary | ICD-10-CM | POA: Diagnosis not present

## 2023-07-04 DIAGNOSIS — Z8673 Personal history of transient ischemic attack (TIA), and cerebral infarction without residual deficits: Secondary | ICD-10-CM | POA: Diagnosis not present

## 2023-07-04 DIAGNOSIS — M6281 Muscle weakness (generalized): Secondary | ICD-10-CM | POA: Diagnosis not present

## 2023-07-05 DIAGNOSIS — R278 Other lack of coordination: Secondary | ICD-10-CM | POA: Diagnosis not present

## 2023-07-05 DIAGNOSIS — Z8673 Personal history of transient ischemic attack (TIA), and cerebral infarction without residual deficits: Secondary | ICD-10-CM | POA: Diagnosis not present

## 2023-07-05 DIAGNOSIS — N1832 Chronic kidney disease, stage 3b: Secondary | ICD-10-CM | POA: Diagnosis not present

## 2023-07-05 DIAGNOSIS — I10 Essential (primary) hypertension: Secondary | ICD-10-CM | POA: Diagnosis not present

## 2023-07-05 DIAGNOSIS — M6281 Muscle weakness (generalized): Secondary | ICD-10-CM | POA: Diagnosis not present

## 2023-07-08 DIAGNOSIS — M6281 Muscle weakness (generalized): Secondary | ICD-10-CM | POA: Diagnosis not present

## 2023-07-08 DIAGNOSIS — N183 Chronic kidney disease, stage 3 unspecified: Secondary | ICD-10-CM | POA: Diagnosis not present

## 2023-07-08 DIAGNOSIS — R279 Unspecified lack of coordination: Secondary | ICD-10-CM | POA: Diagnosis not present

## 2023-07-08 DIAGNOSIS — Z8673 Personal history of transient ischemic attack (TIA), and cerebral infarction without residual deficits: Secondary | ICD-10-CM | POA: Diagnosis not present

## 2023-07-08 DIAGNOSIS — I10 Essential (primary) hypertension: Secondary | ICD-10-CM | POA: Diagnosis not present

## 2023-07-09 DIAGNOSIS — R350 Frequency of micturition: Secondary | ICD-10-CM | POA: Diagnosis not present

## 2023-07-10 DIAGNOSIS — N1832 Chronic kidney disease, stage 3b: Secondary | ICD-10-CM | POA: Diagnosis not present

## 2023-07-10 DIAGNOSIS — Z8673 Personal history of transient ischemic attack (TIA), and cerebral infarction without residual deficits: Secondary | ICD-10-CM | POA: Diagnosis not present

## 2023-07-10 DIAGNOSIS — I10 Essential (primary) hypertension: Secondary | ICD-10-CM | POA: Diagnosis not present

## 2023-07-10 DIAGNOSIS — M6281 Muscle weakness (generalized): Secondary | ICD-10-CM | POA: Diagnosis not present

## 2023-07-10 DIAGNOSIS — R278 Other lack of coordination: Secondary | ICD-10-CM | POA: Diagnosis not present

## 2023-07-11 DIAGNOSIS — I10 Essential (primary) hypertension: Secondary | ICD-10-CM | POA: Diagnosis not present

## 2023-07-11 DIAGNOSIS — Z8673 Personal history of transient ischemic attack (TIA), and cerebral infarction without residual deficits: Secondary | ICD-10-CM | POA: Diagnosis not present

## 2023-07-11 DIAGNOSIS — N183 Chronic kidney disease, stage 3 unspecified: Secondary | ICD-10-CM | POA: Diagnosis not present

## 2023-07-11 DIAGNOSIS — R279 Unspecified lack of coordination: Secondary | ICD-10-CM | POA: Diagnosis not present

## 2023-07-11 DIAGNOSIS — M6281 Muscle weakness (generalized): Secondary | ICD-10-CM | POA: Diagnosis not present

## 2023-07-13 DIAGNOSIS — H3530 Unspecified macular degeneration: Secondary | ICD-10-CM | POA: Diagnosis not present

## 2023-07-13 DIAGNOSIS — I739 Peripheral vascular disease, unspecified: Secondary | ICD-10-CM | POA: Diagnosis not present

## 2023-07-13 DIAGNOSIS — D631 Anemia in chronic kidney disease: Secondary | ICD-10-CM | POA: Diagnosis not present

## 2023-07-13 DIAGNOSIS — R2689 Other abnormalities of gait and mobility: Secondary | ICD-10-CM | POA: Diagnosis not present

## 2023-07-13 DIAGNOSIS — I639 Cerebral infarction, unspecified: Secondary | ICD-10-CM | POA: Diagnosis not present

## 2023-07-13 DIAGNOSIS — K219 Gastro-esophageal reflux disease without esophagitis: Secondary | ICD-10-CM | POA: Diagnosis not present

## 2023-07-13 DIAGNOSIS — N1832 Chronic kidney disease, stage 3b: Secondary | ICD-10-CM | POA: Diagnosis not present

## 2023-07-13 DIAGNOSIS — I10 Essential (primary) hypertension: Secondary | ICD-10-CM | POA: Diagnosis not present

## 2023-07-13 DIAGNOSIS — K5900 Constipation, unspecified: Secondary | ICD-10-CM | POA: Diagnosis not present

## 2023-07-13 DIAGNOSIS — I340 Nonrheumatic mitral (valve) insufficiency: Secondary | ICD-10-CM | POA: Diagnosis not present

## 2023-07-13 DIAGNOSIS — E7800 Pure hypercholesterolemia, unspecified: Secondary | ICD-10-CM | POA: Diagnosis not present

## 2023-07-15 DIAGNOSIS — M6281 Muscle weakness (generalized): Secondary | ICD-10-CM | POA: Diagnosis not present

## 2023-07-15 DIAGNOSIS — R279 Unspecified lack of coordination: Secondary | ICD-10-CM | POA: Diagnosis not present

## 2023-07-15 DIAGNOSIS — N183 Chronic kidney disease, stage 3 unspecified: Secondary | ICD-10-CM | POA: Diagnosis not present

## 2023-07-15 DIAGNOSIS — I10 Essential (primary) hypertension: Secondary | ICD-10-CM | POA: Diagnosis not present

## 2023-07-15 DIAGNOSIS — Z8673 Personal history of transient ischemic attack (TIA), and cerebral infarction without residual deficits: Secondary | ICD-10-CM | POA: Diagnosis not present

## 2023-07-17 DIAGNOSIS — I10 Essential (primary) hypertension: Secondary | ICD-10-CM | POA: Diagnosis not present

## 2023-07-17 DIAGNOSIS — R278 Other lack of coordination: Secondary | ICD-10-CM | POA: Diagnosis not present

## 2023-07-17 DIAGNOSIS — Z8673 Personal history of transient ischemic attack (TIA), and cerebral infarction without residual deficits: Secondary | ICD-10-CM | POA: Diagnosis not present

## 2023-07-17 DIAGNOSIS — M6281 Muscle weakness (generalized): Secondary | ICD-10-CM | POA: Diagnosis not present

## 2023-07-17 DIAGNOSIS — N1832 Chronic kidney disease, stage 3b: Secondary | ICD-10-CM | POA: Diagnosis not present

## 2023-07-18 DIAGNOSIS — R279 Unspecified lack of coordination: Secondary | ICD-10-CM | POA: Diagnosis not present

## 2023-07-18 DIAGNOSIS — Z8673 Personal history of transient ischemic attack (TIA), and cerebral infarction without residual deficits: Secondary | ICD-10-CM | POA: Diagnosis not present

## 2023-07-18 DIAGNOSIS — M6281 Muscle weakness (generalized): Secondary | ICD-10-CM | POA: Diagnosis not present

## 2023-07-18 DIAGNOSIS — N183 Chronic kidney disease, stage 3 unspecified: Secondary | ICD-10-CM | POA: Diagnosis not present

## 2023-07-18 DIAGNOSIS — I10 Essential (primary) hypertension: Secondary | ICD-10-CM | POA: Diagnosis not present

## 2023-07-19 DIAGNOSIS — R278 Other lack of coordination: Secondary | ICD-10-CM | POA: Diagnosis not present

## 2023-07-19 DIAGNOSIS — M6281 Muscle weakness (generalized): Secondary | ICD-10-CM | POA: Diagnosis not present

## 2023-07-19 DIAGNOSIS — N1832 Chronic kidney disease, stage 3b: Secondary | ICD-10-CM | POA: Diagnosis not present

## 2023-07-19 DIAGNOSIS — Z8673 Personal history of transient ischemic attack (TIA), and cerebral infarction without residual deficits: Secondary | ICD-10-CM | POA: Diagnosis not present

## 2023-07-19 DIAGNOSIS — I10 Essential (primary) hypertension: Secondary | ICD-10-CM | POA: Diagnosis not present

## 2023-07-23 DIAGNOSIS — I10 Essential (primary) hypertension: Secondary | ICD-10-CM | POA: Diagnosis not present

## 2023-07-23 DIAGNOSIS — Z8673 Personal history of transient ischemic attack (TIA), and cerebral infarction without residual deficits: Secondary | ICD-10-CM | POA: Diagnosis not present

## 2023-07-23 DIAGNOSIS — N183 Chronic kidney disease, stage 3 unspecified: Secondary | ICD-10-CM | POA: Diagnosis not present

## 2023-07-23 DIAGNOSIS — R279 Unspecified lack of coordination: Secondary | ICD-10-CM | POA: Diagnosis not present

## 2023-07-23 DIAGNOSIS — M6281 Muscle weakness (generalized): Secondary | ICD-10-CM | POA: Diagnosis not present

## 2023-07-24 DIAGNOSIS — I10 Essential (primary) hypertension: Secondary | ICD-10-CM | POA: Diagnosis not present

## 2023-07-24 DIAGNOSIS — M6281 Muscle weakness (generalized): Secondary | ICD-10-CM | POA: Diagnosis not present

## 2023-07-24 DIAGNOSIS — R278 Other lack of coordination: Secondary | ICD-10-CM | POA: Diagnosis not present

## 2023-07-24 DIAGNOSIS — N1832 Chronic kidney disease, stage 3b: Secondary | ICD-10-CM | POA: Diagnosis not present

## 2023-07-24 DIAGNOSIS — Z8673 Personal history of transient ischemic attack (TIA), and cerebral infarction without residual deficits: Secondary | ICD-10-CM | POA: Diagnosis not present

## 2023-07-26 DIAGNOSIS — R278 Other lack of coordination: Secondary | ICD-10-CM | POA: Diagnosis not present

## 2023-07-26 DIAGNOSIS — Z8673 Personal history of transient ischemic attack (TIA), and cerebral infarction without residual deficits: Secondary | ICD-10-CM | POA: Diagnosis not present

## 2023-07-26 DIAGNOSIS — M6281 Muscle weakness (generalized): Secondary | ICD-10-CM | POA: Diagnosis not present

## 2023-07-26 DIAGNOSIS — I10 Essential (primary) hypertension: Secondary | ICD-10-CM | POA: Diagnosis not present

## 2023-07-26 DIAGNOSIS — N1832 Chronic kidney disease, stage 3b: Secondary | ICD-10-CM | POA: Diagnosis not present

## 2023-07-30 DIAGNOSIS — M6281 Muscle weakness (generalized): Secondary | ICD-10-CM | POA: Diagnosis not present

## 2023-07-30 DIAGNOSIS — R279 Unspecified lack of coordination: Secondary | ICD-10-CM | POA: Diagnosis not present

## 2023-07-30 DIAGNOSIS — R278 Other lack of coordination: Secondary | ICD-10-CM | POA: Diagnosis not present

## 2023-07-30 DIAGNOSIS — I10 Essential (primary) hypertension: Secondary | ICD-10-CM | POA: Diagnosis not present

## 2023-07-30 DIAGNOSIS — N1832 Chronic kidney disease, stage 3b: Secondary | ICD-10-CM | POA: Diagnosis not present

## 2023-07-30 DIAGNOSIS — Z8673 Personal history of transient ischemic attack (TIA), and cerebral infarction without residual deficits: Secondary | ICD-10-CM | POA: Diagnosis not present

## 2023-07-30 DIAGNOSIS — N183 Chronic kidney disease, stage 3 unspecified: Secondary | ICD-10-CM | POA: Diagnosis not present

## 2023-07-31 DIAGNOSIS — M6281 Muscle weakness (generalized): Secondary | ICD-10-CM | POA: Diagnosis not present

## 2023-07-31 DIAGNOSIS — N1832 Chronic kidney disease, stage 3b: Secondary | ICD-10-CM | POA: Diagnosis not present

## 2023-07-31 DIAGNOSIS — R278 Other lack of coordination: Secondary | ICD-10-CM | POA: Diagnosis not present

## 2023-07-31 DIAGNOSIS — Z8673 Personal history of transient ischemic attack (TIA), and cerebral infarction without residual deficits: Secondary | ICD-10-CM | POA: Diagnosis not present

## 2023-07-31 DIAGNOSIS — I10 Essential (primary) hypertension: Secondary | ICD-10-CM | POA: Diagnosis not present

## 2023-08-01 DIAGNOSIS — R279 Unspecified lack of coordination: Secondary | ICD-10-CM | POA: Diagnosis not present

## 2023-08-01 DIAGNOSIS — Z8673 Personal history of transient ischemic attack (TIA), and cerebral infarction without residual deficits: Secondary | ICD-10-CM | POA: Diagnosis not present

## 2023-08-01 DIAGNOSIS — M6281 Muscle weakness (generalized): Secondary | ICD-10-CM | POA: Diagnosis not present

## 2023-08-01 DIAGNOSIS — I10 Essential (primary) hypertension: Secondary | ICD-10-CM | POA: Diagnosis not present

## 2023-08-01 DIAGNOSIS — N183 Chronic kidney disease, stage 3 unspecified: Secondary | ICD-10-CM | POA: Diagnosis not present

## 2023-08-02 DIAGNOSIS — I10 Essential (primary) hypertension: Secondary | ICD-10-CM | POA: Diagnosis not present

## 2023-08-02 DIAGNOSIS — R278 Other lack of coordination: Secondary | ICD-10-CM | POA: Diagnosis not present

## 2023-08-02 DIAGNOSIS — N1832 Chronic kidney disease, stage 3b: Secondary | ICD-10-CM | POA: Diagnosis not present

## 2023-08-02 DIAGNOSIS — Z8673 Personal history of transient ischemic attack (TIA), and cerebral infarction without residual deficits: Secondary | ICD-10-CM | POA: Diagnosis not present

## 2023-08-02 DIAGNOSIS — M6281 Muscle weakness (generalized): Secondary | ICD-10-CM | POA: Diagnosis not present

## 2023-08-06 DIAGNOSIS — Z8673 Personal history of transient ischemic attack (TIA), and cerebral infarction without residual deficits: Secondary | ICD-10-CM | POA: Diagnosis not present

## 2023-08-06 DIAGNOSIS — R279 Unspecified lack of coordination: Secondary | ICD-10-CM | POA: Diagnosis not present

## 2023-08-06 DIAGNOSIS — I10 Essential (primary) hypertension: Secondary | ICD-10-CM | POA: Diagnosis not present

## 2023-08-06 DIAGNOSIS — N183 Chronic kidney disease, stage 3 unspecified: Secondary | ICD-10-CM | POA: Diagnosis not present

## 2023-08-06 DIAGNOSIS — M6281 Muscle weakness (generalized): Secondary | ICD-10-CM | POA: Diagnosis not present

## 2023-08-07 DIAGNOSIS — Z8673 Personal history of transient ischemic attack (TIA), and cerebral infarction without residual deficits: Secondary | ICD-10-CM | POA: Diagnosis not present

## 2023-08-07 DIAGNOSIS — M6281 Muscle weakness (generalized): Secondary | ICD-10-CM | POA: Diagnosis not present

## 2023-08-07 DIAGNOSIS — N1832 Chronic kidney disease, stage 3b: Secondary | ICD-10-CM | POA: Diagnosis not present

## 2023-08-07 DIAGNOSIS — R278 Other lack of coordination: Secondary | ICD-10-CM | POA: Diagnosis not present

## 2023-08-07 DIAGNOSIS — I10 Essential (primary) hypertension: Secondary | ICD-10-CM | POA: Diagnosis not present

## 2023-08-08 DIAGNOSIS — R7309 Other abnormal glucose: Secondary | ICD-10-CM | POA: Diagnosis not present

## 2023-08-08 DIAGNOSIS — I10 Essential (primary) hypertension: Secondary | ICD-10-CM | POA: Diagnosis not present

## 2023-08-08 DIAGNOSIS — E782 Mixed hyperlipidemia: Secondary | ICD-10-CM | POA: Diagnosis not present

## 2023-08-08 DIAGNOSIS — M6281 Muscle weakness (generalized): Secondary | ICD-10-CM | POA: Diagnosis not present

## 2023-08-08 DIAGNOSIS — R279 Unspecified lack of coordination: Secondary | ICD-10-CM | POA: Diagnosis not present

## 2023-08-08 DIAGNOSIS — Z8673 Personal history of transient ischemic attack (TIA), and cerebral infarction without residual deficits: Secondary | ICD-10-CM | POA: Diagnosis not present

## 2023-08-08 DIAGNOSIS — N183 Chronic kidney disease, stage 3 unspecified: Secondary | ICD-10-CM | POA: Diagnosis not present

## 2023-08-08 DIAGNOSIS — D519 Vitamin B12 deficiency anemia, unspecified: Secondary | ICD-10-CM | POA: Diagnosis not present

## 2023-08-08 DIAGNOSIS — E559 Vitamin D deficiency, unspecified: Secondary | ICD-10-CM | POA: Diagnosis not present

## 2023-08-08 DIAGNOSIS — Z79899 Other long term (current) drug therapy: Secondary | ICD-10-CM | POA: Diagnosis not present

## 2023-08-08 DIAGNOSIS — E038 Other specified hypothyroidism: Secondary | ICD-10-CM | POA: Diagnosis not present

## 2023-08-09 DIAGNOSIS — K219 Gastro-esophageal reflux disease without esophagitis: Secondary | ICD-10-CM | POA: Diagnosis not present

## 2023-08-09 DIAGNOSIS — E7800 Pure hypercholesterolemia, unspecified: Secondary | ICD-10-CM | POA: Diagnosis not present

## 2023-08-09 DIAGNOSIS — I739 Peripheral vascular disease, unspecified: Secondary | ICD-10-CM | POA: Diagnosis not present

## 2023-08-09 DIAGNOSIS — K5900 Constipation, unspecified: Secondary | ICD-10-CM | POA: Diagnosis not present

## 2023-08-09 DIAGNOSIS — Z8673 Personal history of transient ischemic attack (TIA), and cerebral infarction without residual deficits: Secondary | ICD-10-CM | POA: Diagnosis not present

## 2023-08-09 DIAGNOSIS — H3530 Unspecified macular degeneration: Secondary | ICD-10-CM | POA: Diagnosis not present

## 2023-08-09 DIAGNOSIS — D631 Anemia in chronic kidney disease: Secondary | ICD-10-CM | POA: Diagnosis not present

## 2023-08-09 DIAGNOSIS — R2689 Other abnormalities of gait and mobility: Secondary | ICD-10-CM | POA: Diagnosis not present

## 2023-08-09 DIAGNOSIS — I340 Nonrheumatic mitral (valve) insufficiency: Secondary | ICD-10-CM | POA: Diagnosis not present

## 2023-08-09 DIAGNOSIS — I10 Essential (primary) hypertension: Secondary | ICD-10-CM | POA: Diagnosis not present

## 2023-08-09 DIAGNOSIS — N1832 Chronic kidney disease, stage 3b: Secondary | ICD-10-CM | POA: Diagnosis not present

## 2023-08-09 DIAGNOSIS — M6281 Muscle weakness (generalized): Secondary | ICD-10-CM | POA: Diagnosis not present

## 2023-08-09 DIAGNOSIS — R278 Other lack of coordination: Secondary | ICD-10-CM | POA: Diagnosis not present

## 2023-08-09 DIAGNOSIS — I639 Cerebral infarction, unspecified: Secondary | ICD-10-CM | POA: Diagnosis not present

## 2023-08-13 DIAGNOSIS — Z8673 Personal history of transient ischemic attack (TIA), and cerebral infarction without residual deficits: Secondary | ICD-10-CM | POA: Diagnosis not present

## 2023-08-13 DIAGNOSIS — I10 Essential (primary) hypertension: Secondary | ICD-10-CM | POA: Diagnosis not present

## 2023-08-13 DIAGNOSIS — N183 Chronic kidney disease, stage 3 unspecified: Secondary | ICD-10-CM | POA: Diagnosis not present

## 2023-08-13 DIAGNOSIS — R279 Unspecified lack of coordination: Secondary | ICD-10-CM | POA: Diagnosis not present

## 2023-08-13 DIAGNOSIS — M6281 Muscle weakness (generalized): Secondary | ICD-10-CM | POA: Diagnosis not present

## 2023-08-14 DIAGNOSIS — R278 Other lack of coordination: Secondary | ICD-10-CM | POA: Diagnosis not present

## 2023-08-14 DIAGNOSIS — M6281 Muscle weakness (generalized): Secondary | ICD-10-CM | POA: Diagnosis not present

## 2023-08-14 DIAGNOSIS — I10 Essential (primary) hypertension: Secondary | ICD-10-CM | POA: Diagnosis not present

## 2023-08-14 DIAGNOSIS — N1832 Chronic kidney disease, stage 3b: Secondary | ICD-10-CM | POA: Diagnosis not present

## 2023-08-14 DIAGNOSIS — Z8673 Personal history of transient ischemic attack (TIA), and cerebral infarction without residual deficits: Secondary | ICD-10-CM | POA: Diagnosis not present

## 2023-08-15 DIAGNOSIS — N183 Chronic kidney disease, stage 3 unspecified: Secondary | ICD-10-CM | POA: Diagnosis not present

## 2023-08-15 DIAGNOSIS — R279 Unspecified lack of coordination: Secondary | ICD-10-CM | POA: Diagnosis not present

## 2023-08-15 DIAGNOSIS — Z8673 Personal history of transient ischemic attack (TIA), and cerebral infarction without residual deficits: Secondary | ICD-10-CM | POA: Diagnosis not present

## 2023-08-15 DIAGNOSIS — I10 Essential (primary) hypertension: Secondary | ICD-10-CM | POA: Diagnosis not present

## 2023-08-15 DIAGNOSIS — M6281 Muscle weakness (generalized): Secondary | ICD-10-CM | POA: Diagnosis not present

## 2023-08-16 DIAGNOSIS — N1832 Chronic kidney disease, stage 3b: Secondary | ICD-10-CM | POA: Diagnosis not present

## 2023-08-16 DIAGNOSIS — R278 Other lack of coordination: Secondary | ICD-10-CM | POA: Diagnosis not present

## 2023-08-16 DIAGNOSIS — Z8673 Personal history of transient ischemic attack (TIA), and cerebral infarction without residual deficits: Secondary | ICD-10-CM | POA: Diagnosis not present

## 2023-08-16 DIAGNOSIS — M6281 Muscle weakness (generalized): Secondary | ICD-10-CM | POA: Diagnosis not present

## 2023-08-16 DIAGNOSIS — R600 Localized edema: Secondary | ICD-10-CM | POA: Diagnosis not present

## 2023-08-16 DIAGNOSIS — I10 Essential (primary) hypertension: Secondary | ICD-10-CM | POA: Diagnosis not present

## 2023-08-17 DIAGNOSIS — E038 Other specified hypothyroidism: Secondary | ICD-10-CM | POA: Diagnosis not present

## 2023-08-17 DIAGNOSIS — D519 Vitamin B12 deficiency anemia, unspecified: Secondary | ICD-10-CM | POA: Diagnosis not present

## 2023-08-17 DIAGNOSIS — E559 Vitamin D deficiency, unspecified: Secondary | ICD-10-CM | POA: Diagnosis not present

## 2023-08-17 DIAGNOSIS — E782 Mixed hyperlipidemia: Secondary | ICD-10-CM | POA: Diagnosis not present

## 2023-08-17 DIAGNOSIS — I10 Essential (primary) hypertension: Secondary | ICD-10-CM | POA: Diagnosis not present

## 2023-08-17 DIAGNOSIS — D631 Anemia in chronic kidney disease: Secondary | ICD-10-CM | POA: Diagnosis not present

## 2023-08-20 DIAGNOSIS — Z8673 Personal history of transient ischemic attack (TIA), and cerebral infarction without residual deficits: Secondary | ICD-10-CM | POA: Diagnosis not present

## 2023-08-20 DIAGNOSIS — M6281 Muscle weakness (generalized): Secondary | ICD-10-CM | POA: Diagnosis not present

## 2023-08-20 DIAGNOSIS — R279 Unspecified lack of coordination: Secondary | ICD-10-CM | POA: Diagnosis not present

## 2023-08-20 DIAGNOSIS — I10 Essential (primary) hypertension: Secondary | ICD-10-CM | POA: Diagnosis not present

## 2023-08-20 DIAGNOSIS — N183 Chronic kidney disease, stage 3 unspecified: Secondary | ICD-10-CM | POA: Diagnosis not present

## 2023-08-21 DIAGNOSIS — M6281 Muscle weakness (generalized): Secondary | ICD-10-CM | POA: Diagnosis not present

## 2023-08-21 DIAGNOSIS — Z8673 Personal history of transient ischemic attack (TIA), and cerebral infarction without residual deficits: Secondary | ICD-10-CM | POA: Diagnosis not present

## 2023-08-21 DIAGNOSIS — I10 Essential (primary) hypertension: Secondary | ICD-10-CM | POA: Diagnosis not present

## 2023-08-21 DIAGNOSIS — N1832 Chronic kidney disease, stage 3b: Secondary | ICD-10-CM | POA: Diagnosis not present

## 2023-08-21 DIAGNOSIS — R278 Other lack of coordination: Secondary | ICD-10-CM | POA: Diagnosis not present

## 2023-08-22 DIAGNOSIS — I10 Essential (primary) hypertension: Secondary | ICD-10-CM | POA: Diagnosis not present

## 2023-08-22 DIAGNOSIS — Z8673 Personal history of transient ischemic attack (TIA), and cerebral infarction without residual deficits: Secondary | ICD-10-CM | POA: Diagnosis not present

## 2023-08-22 DIAGNOSIS — M6281 Muscle weakness (generalized): Secondary | ICD-10-CM | POA: Diagnosis not present

## 2023-08-22 DIAGNOSIS — N183 Chronic kidney disease, stage 3 unspecified: Secondary | ICD-10-CM | POA: Diagnosis not present

## 2023-08-22 DIAGNOSIS — R279 Unspecified lack of coordination: Secondary | ICD-10-CM | POA: Diagnosis not present

## 2023-08-23 DIAGNOSIS — I10 Essential (primary) hypertension: Secondary | ICD-10-CM | POA: Diagnosis not present

## 2023-08-23 DIAGNOSIS — M6281 Muscle weakness (generalized): Secondary | ICD-10-CM | POA: Diagnosis not present

## 2023-08-23 DIAGNOSIS — R278 Other lack of coordination: Secondary | ICD-10-CM | POA: Diagnosis not present

## 2023-08-23 DIAGNOSIS — N1832 Chronic kidney disease, stage 3b: Secondary | ICD-10-CM | POA: Diagnosis not present

## 2023-08-23 DIAGNOSIS — Z8673 Personal history of transient ischemic attack (TIA), and cerebral infarction without residual deficits: Secondary | ICD-10-CM | POA: Diagnosis not present

## 2023-08-27 DIAGNOSIS — Z8673 Personal history of transient ischemic attack (TIA), and cerebral infarction without residual deficits: Secondary | ICD-10-CM | POA: Diagnosis not present

## 2023-08-27 DIAGNOSIS — R279 Unspecified lack of coordination: Secondary | ICD-10-CM | POA: Diagnosis not present

## 2023-08-27 DIAGNOSIS — I10 Essential (primary) hypertension: Secondary | ICD-10-CM | POA: Diagnosis not present

## 2023-08-27 DIAGNOSIS — M6281 Muscle weakness (generalized): Secondary | ICD-10-CM | POA: Diagnosis not present

## 2023-08-27 DIAGNOSIS — N183 Chronic kidney disease, stage 3 unspecified: Secondary | ICD-10-CM | POA: Diagnosis not present

## 2023-08-29 DIAGNOSIS — M6281 Muscle weakness (generalized): Secondary | ICD-10-CM | POA: Diagnosis not present

## 2023-08-29 DIAGNOSIS — N183 Chronic kidney disease, stage 3 unspecified: Secondary | ICD-10-CM | POA: Diagnosis not present

## 2023-08-29 DIAGNOSIS — R279 Unspecified lack of coordination: Secondary | ICD-10-CM | POA: Diagnosis not present

## 2023-08-29 DIAGNOSIS — I10 Essential (primary) hypertension: Secondary | ICD-10-CM | POA: Diagnosis not present

## 2023-08-29 DIAGNOSIS — Z8673 Personal history of transient ischemic attack (TIA), and cerebral infarction without residual deficits: Secondary | ICD-10-CM | POA: Diagnosis not present

## 2023-08-30 DIAGNOSIS — R278 Other lack of coordination: Secondary | ICD-10-CM | POA: Diagnosis not present

## 2023-08-30 DIAGNOSIS — N1832 Chronic kidney disease, stage 3b: Secondary | ICD-10-CM | POA: Diagnosis not present

## 2023-08-30 DIAGNOSIS — Z8673 Personal history of transient ischemic attack (TIA), and cerebral infarction without residual deficits: Secondary | ICD-10-CM | POA: Diagnosis not present

## 2023-08-30 DIAGNOSIS — I10 Essential (primary) hypertension: Secondary | ICD-10-CM | POA: Diagnosis not present

## 2023-08-30 DIAGNOSIS — M6281 Muscle weakness (generalized): Secondary | ICD-10-CM | POA: Diagnosis not present

## 2023-09-03 DIAGNOSIS — R279 Unspecified lack of coordination: Secondary | ICD-10-CM | POA: Diagnosis not present

## 2023-09-03 DIAGNOSIS — M6281 Muscle weakness (generalized): Secondary | ICD-10-CM | POA: Diagnosis not present

## 2023-09-03 DIAGNOSIS — N183 Chronic kidney disease, stage 3 unspecified: Secondary | ICD-10-CM | POA: Diagnosis not present

## 2023-09-03 DIAGNOSIS — I10 Essential (primary) hypertension: Secondary | ICD-10-CM | POA: Diagnosis not present

## 2023-09-03 DIAGNOSIS — Z8673 Personal history of transient ischemic attack (TIA), and cerebral infarction without residual deficits: Secondary | ICD-10-CM | POA: Diagnosis not present

## 2023-09-05 DIAGNOSIS — I10 Essential (primary) hypertension: Secondary | ICD-10-CM | POA: Diagnosis not present

## 2023-09-05 DIAGNOSIS — Z8673 Personal history of transient ischemic attack (TIA), and cerebral infarction without residual deficits: Secondary | ICD-10-CM | POA: Diagnosis not present

## 2023-09-05 DIAGNOSIS — R279 Unspecified lack of coordination: Secondary | ICD-10-CM | POA: Diagnosis not present

## 2023-09-05 DIAGNOSIS — N183 Chronic kidney disease, stage 3 unspecified: Secondary | ICD-10-CM | POA: Diagnosis not present

## 2023-09-05 DIAGNOSIS — M6281 Muscle weakness (generalized): Secondary | ICD-10-CM | POA: Diagnosis not present

## 2023-09-06 DIAGNOSIS — R2689 Other abnormalities of gait and mobility: Secondary | ICD-10-CM | POA: Diagnosis not present

## 2023-09-06 DIAGNOSIS — H3530 Unspecified macular degeneration: Secondary | ICD-10-CM | POA: Diagnosis not present

## 2023-09-06 DIAGNOSIS — K219 Gastro-esophageal reflux disease without esophagitis: Secondary | ICD-10-CM | POA: Diagnosis not present

## 2023-09-06 DIAGNOSIS — R600 Localized edema: Secondary | ICD-10-CM | POA: Diagnosis not present

## 2023-09-06 DIAGNOSIS — D631 Anemia in chronic kidney disease: Secondary | ICD-10-CM | POA: Diagnosis not present

## 2023-09-06 DIAGNOSIS — I10 Essential (primary) hypertension: Secondary | ICD-10-CM | POA: Diagnosis not present

## 2023-09-06 DIAGNOSIS — N1832 Chronic kidney disease, stage 3b: Secondary | ICD-10-CM | POA: Diagnosis not present

## 2023-09-06 DIAGNOSIS — I739 Peripheral vascular disease, unspecified: Secondary | ICD-10-CM | POA: Diagnosis not present

## 2023-09-06 DIAGNOSIS — I639 Cerebral infarction, unspecified: Secondary | ICD-10-CM | POA: Diagnosis not present

## 2023-09-06 DIAGNOSIS — E7800 Pure hypercholesterolemia, unspecified: Secondary | ICD-10-CM | POA: Diagnosis not present

## 2023-09-06 DIAGNOSIS — I340 Nonrheumatic mitral (valve) insufficiency: Secondary | ICD-10-CM | POA: Diagnosis not present

## 2023-10-04 DEATH — deceased
